# Patient Record
Sex: Female | Born: 1935 | Race: White | Hispanic: No | Smoking: Former smoker
Health system: Southern US, Community
[De-identification: ages and names within clinical notes are randomized; demographics above are authoritative.]

## PROBLEM LIST (undated history)

## (undated) DIAGNOSIS — E785 Hyperlipidemia, unspecified: Secondary | ICD-10-CM

## (undated) DIAGNOSIS — S4290XA Fracture of unspecified shoulder girdle, part unspecified, initial encounter for closed fracture: Secondary | ICD-10-CM

## (undated) DIAGNOSIS — Z9289 Personal history of other medical treatment: Secondary | ICD-10-CM

## (undated) DIAGNOSIS — K219 Gastro-esophageal reflux disease without esophagitis: Secondary | ICD-10-CM

## (undated) DIAGNOSIS — Z9981 Dependence on supplemental oxygen: Secondary | ICD-10-CM

## (undated) DIAGNOSIS — K5790 Diverticulosis of intestine, part unspecified, without perforation or abscess without bleeding: Secondary | ICD-10-CM

## (undated) DIAGNOSIS — I2699 Other pulmonary embolism without acute cor pulmonale: Secondary | ICD-10-CM

## (undated) DIAGNOSIS — F419 Anxiety disorder, unspecified: Secondary | ICD-10-CM

## (undated) DIAGNOSIS — I471 Supraventricular tachycardia, unspecified: Secondary | ICD-10-CM

## (undated) DIAGNOSIS — I509 Heart failure, unspecified: Secondary | ICD-10-CM

## (undated) DIAGNOSIS — C210 Malignant neoplasm of anus, unspecified: Secondary | ICD-10-CM

## (undated) DIAGNOSIS — I4891 Unspecified atrial fibrillation: Secondary | ICD-10-CM

## (undated) DIAGNOSIS — Z8719 Personal history of other diseases of the digestive system: Secondary | ICD-10-CM

## (undated) DIAGNOSIS — J189 Pneumonia, unspecified organism: Secondary | ICD-10-CM

## (undated) DIAGNOSIS — K259 Gastric ulcer, unspecified as acute or chronic, without hemorrhage or perforation: Secondary | ICD-10-CM

## (undated) DIAGNOSIS — F329 Major depressive disorder, single episode, unspecified: Secondary | ICD-10-CM

## (undated) DIAGNOSIS — E669 Obesity, unspecified: Secondary | ICD-10-CM

## (undated) DIAGNOSIS — C3490 Malignant neoplasm of unspecified part of unspecified bronchus or lung: Secondary | ICD-10-CM

## (undated) DIAGNOSIS — I10 Essential (primary) hypertension: Secondary | ICD-10-CM

## (undated) DIAGNOSIS — B029 Zoster without complications: Secondary | ICD-10-CM

## (undated) DIAGNOSIS — J302 Other seasonal allergic rhinitis: Secondary | ICD-10-CM

## (undated) DIAGNOSIS — Z950 Presence of cardiac pacemaker: Secondary | ICD-10-CM

## (undated) DIAGNOSIS — E559 Vitamin D deficiency, unspecified: Secondary | ICD-10-CM

## (undated) DIAGNOSIS — H8309 Labyrinthitis, unspecified ear: Secondary | ICD-10-CM

## (undated) DIAGNOSIS — C44311 Basal cell carcinoma of skin of nose: Secondary | ICD-10-CM

## (undated) DIAGNOSIS — J42 Unspecified chronic bronchitis: Secondary | ICD-10-CM

## (undated) DIAGNOSIS — K922 Gastrointestinal hemorrhage, unspecified: Secondary | ICD-10-CM

## (undated) DIAGNOSIS — N6019 Diffuse cystic mastopathy of unspecified breast: Secondary | ICD-10-CM

## (undated) DIAGNOSIS — Z933 Colostomy status: Secondary | ICD-10-CM

## (undated) DIAGNOSIS — D649 Anemia, unspecified: Secondary | ICD-10-CM

## (undated) DIAGNOSIS — F32A Depression, unspecified: Secondary | ICD-10-CM

## (undated) HISTORY — PX: SQUAMOUS CELL CARCINOMA EXCISION: SHX2433

## (undated) HISTORY — DX: Anxiety disorder, unspecified: F41.9

## (undated) HISTORY — DX: Unspecified chronic bronchitis: J42

## (undated) HISTORY — PX: BASAL CELL CARCINOMA EXCISION: SHX1214

## (undated) HISTORY — PX: BREAST CYST ASPIRATION: SHX578

## (undated) HISTORY — DX: Hyperlipidemia, unspecified: E78.5

## (undated) HISTORY — DX: Labyrinthitis, unspecified ear: H83.09

## (undated) HISTORY — DX: Zoster without complications: B02.9

## (undated) HISTORY — DX: Depression, unspecified: F32.A

## (undated) HISTORY — DX: Major depressive disorder, single episode, unspecified: F32.9

## (undated) HISTORY — PX: CATARACT EXTRACTION W/ INTRAOCULAR LENS  IMPLANT, BILATERAL: SHX1307

## (undated) HISTORY — DX: Vitamin D deficiency, unspecified: E55.9

## (undated) HISTORY — DX: Fracture of unspecified shoulder girdle, part unspecified, initial encounter for closed fracture: S42.90XA

## (undated) HISTORY — DX: Malignant neoplasm of unspecified part of unspecified bronchus or lung: C34.90

## (undated) HISTORY — DX: Unspecified atrial fibrillation: I48.91

## (undated) HISTORY — DX: Diffuse cystic mastopathy of unspecified breast: N60.19

## (undated) HISTORY — DX: Essential (primary) hypertension: I10

## (undated) HISTORY — PX: COLON SURGERY: SHX602

## (undated) HISTORY — PX: TONSILLECTOMY AND ADENOIDECTOMY: SUR1326

## (undated) HISTORY — PX: DILATION AND CURETTAGE OF UTERUS: SHX78

## (undated) HISTORY — DX: Obesity, unspecified: E66.9

## (undated) HISTORY — PX: CHOLECYSTECTOMY OPEN: SUR202

## (undated) HISTORY — DX: Diverticulosis of intestine, part unspecified, without perforation or abscess without bleeding: K57.90

## (undated) HISTORY — DX: Supraventricular tachycardia: I47.1

## (undated) HISTORY — DX: Gastro-esophageal reflux disease without esophagitis: K21.9

## (undated) HISTORY — DX: Supraventricular tachycardia, unspecified: I47.10

---

## 1957-08-12 HISTORY — PX: VAGINAL HYSTERECTOMY: SUR661

## 1957-08-12 HISTORY — PX: APPENDECTOMY: SHX54

## 2011-08-13 HISTORY — PX: LUNG REMOVAL, PARTIAL: SHX233

## 2014-05-12 DIAGNOSIS — I2699 Other pulmonary embolism without acute cor pulmonale: Secondary | ICD-10-CM

## 2014-05-12 HISTORY — DX: Other pulmonary embolism without acute cor pulmonale: I26.99

## 2014-08-30 DIAGNOSIS — Z79899 Other long term (current) drug therapy: Secondary | ICD-10-CM | POA: Diagnosis not present

## 2014-08-30 DIAGNOSIS — F419 Anxiety disorder, unspecified: Secondary | ICD-10-CM | POA: Diagnosis not present

## 2014-08-30 DIAGNOSIS — K219 Gastro-esophageal reflux disease without esophagitis: Secondary | ICD-10-CM | POA: Diagnosis not present

## 2014-08-30 DIAGNOSIS — I10 Essential (primary) hypertension: Secondary | ICD-10-CM | POA: Diagnosis not present

## 2014-08-30 DIAGNOSIS — E785 Hyperlipidemia, unspecified: Secondary | ICD-10-CM | POA: Diagnosis not present

## 2014-08-30 DIAGNOSIS — J449 Chronic obstructive pulmonary disease, unspecified: Secondary | ICD-10-CM | POA: Diagnosis not present

## 2014-08-30 DIAGNOSIS — E559 Vitamin D deficiency, unspecified: Secondary | ICD-10-CM | POA: Diagnosis not present

## 2014-09-20 DIAGNOSIS — R0602 Shortness of breath: Secondary | ICD-10-CM | POA: Diagnosis not present

## 2014-09-20 DIAGNOSIS — Z87891 Personal history of nicotine dependence: Secondary | ICD-10-CM | POA: Diagnosis not present

## 2014-09-20 DIAGNOSIS — J9610 Chronic respiratory failure, unspecified whether with hypoxia or hypercapnia: Secondary | ICD-10-CM | POA: Diagnosis not present

## 2014-09-20 DIAGNOSIS — J449 Chronic obstructive pulmonary disease, unspecified: Secondary | ICD-10-CM | POA: Diagnosis not present

## 2014-09-20 DIAGNOSIS — R093 Abnormal sputum: Secondary | ICD-10-CM | POA: Diagnosis not present

## 2014-09-20 DIAGNOSIS — R0902 Hypoxemia: Secondary | ICD-10-CM | POA: Diagnosis not present

## 2014-09-20 DIAGNOSIS — D3A00 Benign carcinoid tumor of unspecified site: Secondary | ICD-10-CM | POA: Diagnosis not present

## 2014-09-21 DIAGNOSIS — E559 Vitamin D deficiency, unspecified: Secondary | ICD-10-CM | POA: Diagnosis not present

## 2014-09-21 DIAGNOSIS — K219 Gastro-esophageal reflux disease without esophagitis: Secondary | ICD-10-CM | POA: Diagnosis not present

## 2014-09-21 DIAGNOSIS — I10 Essential (primary) hypertension: Secondary | ICD-10-CM | POA: Diagnosis not present

## 2014-09-21 DIAGNOSIS — F419 Anxiety disorder, unspecified: Secondary | ICD-10-CM | POA: Diagnosis not present

## 2014-09-23 DIAGNOSIS — I10 Essential (primary) hypertension: Secondary | ICD-10-CM | POA: Diagnosis not present

## 2014-09-23 DIAGNOSIS — E782 Mixed hyperlipidemia: Secondary | ICD-10-CM | POA: Diagnosis not present

## 2014-09-23 DIAGNOSIS — I872 Venous insufficiency (chronic) (peripheral): Secondary | ICD-10-CM | POA: Diagnosis not present

## 2014-09-23 DIAGNOSIS — I482 Chronic atrial fibrillation: Secondary | ICD-10-CM | POA: Diagnosis not present

## 2014-11-23 DIAGNOSIS — D3A00 Benign carcinoid tumor of unspecified site: Secondary | ICD-10-CM | POA: Diagnosis not present

## 2014-11-23 DIAGNOSIS — C7A090 Malignant carcinoid tumor of the bronchus and lung: Secondary | ICD-10-CM | POA: Diagnosis not present

## 2014-11-23 DIAGNOSIS — D649 Anemia, unspecified: Secondary | ICD-10-CM | POA: Diagnosis not present

## 2014-11-30 DIAGNOSIS — I2782 Chronic pulmonary embolism: Secondary | ICD-10-CM | POA: Diagnosis not present

## 2014-11-30 DIAGNOSIS — C7A090 Malignant carcinoid tumor of the bronchus and lung: Secondary | ICD-10-CM | POA: Diagnosis not present

## 2015-01-02 DIAGNOSIS — L918 Other hypertrophic disorders of the skin: Secondary | ICD-10-CM | POA: Diagnosis not present

## 2015-01-02 DIAGNOSIS — D1801 Hemangioma of skin and subcutaneous tissue: Secondary | ICD-10-CM | POA: Diagnosis not present

## 2015-01-02 DIAGNOSIS — L821 Other seborrheic keratosis: Secondary | ICD-10-CM | POA: Diagnosis not present

## 2015-01-02 DIAGNOSIS — C44311 Basal cell carcinoma of skin of nose: Secondary | ICD-10-CM | POA: Diagnosis not present

## 2015-01-02 DIAGNOSIS — D485 Neoplasm of uncertain behavior of skin: Secondary | ICD-10-CM | POA: Diagnosis not present

## 2015-02-01 DIAGNOSIS — B078 Other viral warts: Secondary | ICD-10-CM | POA: Diagnosis not present

## 2015-02-01 DIAGNOSIS — L981 Factitial dermatitis: Secondary | ICD-10-CM | POA: Diagnosis not present

## 2015-02-01 DIAGNOSIS — L821 Other seborrheic keratosis: Secondary | ICD-10-CM | POA: Diagnosis not present

## 2015-02-01 DIAGNOSIS — C44311 Basal cell carcinoma of skin of nose: Secondary | ICD-10-CM | POA: Diagnosis not present

## 2015-02-01 DIAGNOSIS — L820 Inflamed seborrheic keratosis: Secondary | ICD-10-CM | POA: Diagnosis not present

## 2015-02-01 DIAGNOSIS — L720 Epidermal cyst: Secondary | ICD-10-CM | POA: Diagnosis not present

## 2015-02-01 DIAGNOSIS — L918 Other hypertrophic disorders of the skin: Secondary | ICD-10-CM | POA: Diagnosis not present

## 2015-02-06 DIAGNOSIS — L723 Sebaceous cyst: Secondary | ICD-10-CM | POA: Diagnosis not present

## 2015-02-06 DIAGNOSIS — C4491 Basal cell carcinoma of skin, unspecified: Secondary | ICD-10-CM | POA: Diagnosis not present

## 2015-02-24 DIAGNOSIS — N8111 Cystocele, midline: Secondary | ICD-10-CM | POA: Diagnosis not present

## 2015-02-24 DIAGNOSIS — N816 Rectocele: Secondary | ICD-10-CM | POA: Diagnosis not present

## 2015-02-24 DIAGNOSIS — N952 Postmenopausal atrophic vaginitis: Secondary | ICD-10-CM | POA: Diagnosis not present

## 2015-02-24 DIAGNOSIS — Z1231 Encounter for screening mammogram for malignant neoplasm of breast: Secondary | ICD-10-CM | POA: Diagnosis not present

## 2015-02-24 DIAGNOSIS — Z1212 Encounter for screening for malignant neoplasm of rectum: Secondary | ICD-10-CM | POA: Diagnosis not present

## 2015-02-24 DIAGNOSIS — Z01411 Encounter for gynecological examination (general) (routine) with abnormal findings: Secondary | ICD-10-CM | POA: Diagnosis not present

## 2015-02-24 DIAGNOSIS — B379 Candidiasis, unspecified: Secondary | ICD-10-CM | POA: Diagnosis not present

## 2015-03-21 DIAGNOSIS — J9610 Chronic respiratory failure, unspecified whether with hypoxia or hypercapnia: Secondary | ICD-10-CM | POA: Diagnosis not present

## 2015-03-21 DIAGNOSIS — R0902 Hypoxemia: Secondary | ICD-10-CM | POA: Diagnosis not present

## 2015-03-21 DIAGNOSIS — J449 Chronic obstructive pulmonary disease, unspecified: Secondary | ICD-10-CM | POA: Diagnosis not present

## 2015-03-21 DIAGNOSIS — J309 Allergic rhinitis, unspecified: Secondary | ICD-10-CM | POA: Diagnosis not present

## 2015-03-21 DIAGNOSIS — D3A00 Benign carcinoid tumor of unspecified site: Secondary | ICD-10-CM | POA: Diagnosis not present

## 2015-03-21 DIAGNOSIS — R093 Abnormal sputum: Secondary | ICD-10-CM | POA: Diagnosis not present

## 2015-03-21 DIAGNOSIS — Z87891 Personal history of nicotine dependence: Secondary | ICD-10-CM | POA: Diagnosis not present

## 2015-04-10 DIAGNOSIS — C44311 Basal cell carcinoma of skin of nose: Secondary | ICD-10-CM | POA: Diagnosis not present

## 2015-05-01 DIAGNOSIS — L720 Epidermal cyst: Secondary | ICD-10-CM | POA: Diagnosis not present

## 2015-05-01 DIAGNOSIS — L723 Sebaceous cyst: Secondary | ICD-10-CM | POA: Diagnosis not present

## 2015-05-09 DIAGNOSIS — F329 Major depressive disorder, single episode, unspecified: Secondary | ICD-10-CM | POA: Diagnosis not present

## 2015-05-09 DIAGNOSIS — K219 Gastro-esophageal reflux disease without esophagitis: Secondary | ICD-10-CM | POA: Diagnosis not present

## 2015-05-09 DIAGNOSIS — E559 Vitamin D deficiency, unspecified: Secondary | ICD-10-CM | POA: Diagnosis not present

## 2015-05-09 DIAGNOSIS — I4891 Unspecified atrial fibrillation: Secondary | ICD-10-CM | POA: Diagnosis not present

## 2015-05-09 DIAGNOSIS — Z79899 Other long term (current) drug therapy: Secondary | ICD-10-CM | POA: Diagnosis not present

## 2015-05-09 DIAGNOSIS — E785 Hyperlipidemia, unspecified: Secondary | ICD-10-CM | POA: Diagnosis not present

## 2015-05-09 DIAGNOSIS — F419 Anxiety disorder, unspecified: Secondary | ICD-10-CM | POA: Diagnosis not present

## 2015-05-09 DIAGNOSIS — I10 Essential (primary) hypertension: Secondary | ICD-10-CM | POA: Diagnosis not present

## 2015-05-10 DIAGNOSIS — H3531 Nonexudative age-related macular degeneration: Secondary | ICD-10-CM | POA: Diagnosis not present

## 2015-05-31 DIAGNOSIS — D696 Thrombocytopenia, unspecified: Secondary | ICD-10-CM | POA: Diagnosis not present

## 2015-05-31 DIAGNOSIS — C7A090 Malignant carcinoid tumor of the bronchus and lung: Secondary | ICD-10-CM | POA: Diagnosis not present

## 2015-05-31 DIAGNOSIS — Z5111 Encounter for antineoplastic chemotherapy: Secondary | ICD-10-CM | POA: Diagnosis not present

## 2015-05-31 DIAGNOSIS — D701 Agranulocytosis secondary to cancer chemotherapy: Secondary | ICD-10-CM | POA: Diagnosis not present

## 2015-05-31 DIAGNOSIS — D649 Anemia, unspecified: Secondary | ICD-10-CM | POA: Diagnosis not present

## 2015-06-05 DIAGNOSIS — D649 Anemia, unspecified: Secondary | ICD-10-CM | POA: Diagnosis not present

## 2015-06-05 DIAGNOSIS — C7A090 Malignant carcinoid tumor of the bronchus and lung: Secondary | ICD-10-CM | POA: Diagnosis not present

## 2015-06-07 DIAGNOSIS — C7A090 Malignant carcinoid tumor of the bronchus and lung: Secondary | ICD-10-CM | POA: Diagnosis not present

## 2015-06-26 DIAGNOSIS — C7A090 Malignant carcinoid tumor of the bronchus and lung: Secondary | ICD-10-CM | POA: Diagnosis not present

## 2015-08-15 DIAGNOSIS — Z08 Encounter for follow-up examination after completed treatment for malignant neoplasm: Secondary | ICD-10-CM | POA: Diagnosis not present

## 2015-08-15 DIAGNOSIS — L720 Epidermal cyst: Secondary | ICD-10-CM | POA: Diagnosis not present

## 2015-08-15 DIAGNOSIS — D2262 Melanocytic nevi of left upper limb, including shoulder: Secondary | ICD-10-CM | POA: Diagnosis not present

## 2015-08-15 DIAGNOSIS — Z85828 Personal history of other malignant neoplasm of skin: Secondary | ICD-10-CM | POA: Diagnosis not present

## 2015-08-15 DIAGNOSIS — L821 Other seborrheic keratosis: Secondary | ICD-10-CM | POA: Diagnosis not present

## 2015-09-04 DIAGNOSIS — C44311 Basal cell carcinoma of skin of nose: Secondary | ICD-10-CM | POA: Diagnosis not present

## 2015-09-19 DIAGNOSIS — J9610 Chronic respiratory failure, unspecified whether with hypoxia or hypercapnia: Secondary | ICD-10-CM | POA: Diagnosis not present

## 2015-09-19 DIAGNOSIS — R0902 Hypoxemia: Secondary | ICD-10-CM | POA: Diagnosis not present

## 2015-09-19 DIAGNOSIS — C7A090 Malignant carcinoid tumor of the bronchus and lung: Secondary | ICD-10-CM | POA: Diagnosis not present

## 2015-09-19 DIAGNOSIS — D649 Anemia, unspecified: Secondary | ICD-10-CM | POA: Diagnosis not present

## 2015-09-19 DIAGNOSIS — J449 Chronic obstructive pulmonary disease, unspecified: Secondary | ICD-10-CM | POA: Diagnosis not present

## 2015-09-19 DIAGNOSIS — R0602 Shortness of breath: Secondary | ICD-10-CM | POA: Diagnosis not present

## 2015-09-19 DIAGNOSIS — Z23 Encounter for immunization: Secondary | ICD-10-CM | POA: Diagnosis not present

## 2015-09-19 DIAGNOSIS — R093 Abnormal sputum: Secondary | ICD-10-CM | POA: Diagnosis not present

## 2015-09-19 DIAGNOSIS — Z87891 Personal history of nicotine dependence: Secondary | ICD-10-CM | POA: Diagnosis not present

## 2015-11-06 DIAGNOSIS — F419 Anxiety disorder, unspecified: Secondary | ICD-10-CM | POA: Diagnosis not present

## 2015-11-06 DIAGNOSIS — E785 Hyperlipidemia, unspecified: Secondary | ICD-10-CM | POA: Diagnosis not present

## 2015-11-06 DIAGNOSIS — I10 Essential (primary) hypertension: Secondary | ICD-10-CM | POA: Diagnosis not present

## 2015-11-06 DIAGNOSIS — K219 Gastro-esophageal reflux disease without esophagitis: Secondary | ICD-10-CM | POA: Diagnosis not present

## 2015-11-06 DIAGNOSIS — I4891 Unspecified atrial fibrillation: Secondary | ICD-10-CM | POA: Diagnosis not present

## 2015-11-06 DIAGNOSIS — E559 Vitamin D deficiency, unspecified: Secondary | ICD-10-CM | POA: Diagnosis not present

## 2015-11-11 DIAGNOSIS — I509 Heart failure, unspecified: Secondary | ICD-10-CM

## 2015-11-11 HISTORY — DX: Heart failure, unspecified: I50.9

## 2015-11-19 DIAGNOSIS — I480 Paroxysmal atrial fibrillation: Secondary | ICD-10-CM | POA: Diagnosis not present

## 2015-11-19 DIAGNOSIS — Z902 Acquired absence of lung [part of]: Secondary | ICD-10-CM | POA: Diagnosis not present

## 2015-11-19 DIAGNOSIS — Z7901 Long term (current) use of anticoagulants: Secondary | ICD-10-CM | POA: Diagnosis not present

## 2015-11-19 DIAGNOSIS — I509 Heart failure, unspecified: Secondary | ICD-10-CM | POA: Diagnosis not present

## 2015-11-19 DIAGNOSIS — J9691 Respiratory failure, unspecified with hypoxia: Secondary | ICD-10-CM | POA: Diagnosis not present

## 2015-11-19 DIAGNOSIS — F329 Major depressive disorder, single episode, unspecified: Secondary | ICD-10-CM | POA: Diagnosis present

## 2015-11-19 DIAGNOSIS — R0902 Hypoxemia: Secondary | ICD-10-CM | POA: Diagnosis not present

## 2015-11-19 DIAGNOSIS — Z9889 Other specified postprocedural states: Secondary | ICD-10-CM | POA: Diagnosis not present

## 2015-11-19 DIAGNOSIS — I4891 Unspecified atrial fibrillation: Secondary | ICD-10-CM | POA: Diagnosis not present

## 2015-11-19 DIAGNOSIS — I248 Other forms of acute ischemic heart disease: Secondary | ICD-10-CM | POA: Diagnosis not present

## 2015-11-19 DIAGNOSIS — Z85110 Personal history of malignant carcinoid tumor of bronchus and lung: Secondary | ICD-10-CM | POA: Diagnosis not present

## 2015-11-19 DIAGNOSIS — Z881 Allergy status to other antibiotic agents status: Secondary | ICD-10-CM | POA: Diagnosis not present

## 2015-11-19 DIAGNOSIS — J9601 Acute respiratory failure with hypoxia: Secondary | ICD-10-CM | POA: Diagnosis not present

## 2015-11-19 DIAGNOSIS — Z880 Allergy status to penicillin: Secondary | ICD-10-CM | POA: Diagnosis not present

## 2015-11-19 DIAGNOSIS — J449 Chronic obstructive pulmonary disease, unspecified: Secondary | ICD-10-CM | POA: Diagnosis not present

## 2015-11-19 DIAGNOSIS — Z888 Allergy status to other drugs, medicaments and biological substances status: Secondary | ICD-10-CM | POA: Diagnosis not present

## 2015-11-19 DIAGNOSIS — M6281 Muscle weakness (generalized): Secondary | ICD-10-CM | POA: Diagnosis not present

## 2015-11-19 DIAGNOSIS — J309 Allergic rhinitis, unspecified: Secondary | ICD-10-CM | POA: Diagnosis not present

## 2015-11-19 DIAGNOSIS — Z9049 Acquired absence of other specified parts of digestive tract: Secondary | ICD-10-CM | POA: Diagnosis not present

## 2015-11-19 DIAGNOSIS — F29 Unspecified psychosis not due to a substance or known physiological condition: Secondary | ICD-10-CM | POA: Diagnosis present

## 2015-11-19 DIAGNOSIS — E785 Hyperlipidemia, unspecified: Secondary | ICD-10-CM | POA: Diagnosis not present

## 2015-11-19 DIAGNOSIS — Z86711 Personal history of pulmonary embolism: Secondary | ICD-10-CM | POA: Diagnosis not present

## 2015-11-19 DIAGNOSIS — R2689 Other abnormalities of gait and mobility: Secondary | ICD-10-CM | POA: Diagnosis not present

## 2015-11-19 DIAGNOSIS — I110 Hypertensive heart disease with heart failure: Secondary | ICD-10-CM | POA: Diagnosis not present

## 2015-11-19 DIAGNOSIS — K219 Gastro-esophageal reflux disease without esophagitis: Secondary | ICD-10-CM | POA: Diagnosis not present

## 2015-11-19 DIAGNOSIS — Z90711 Acquired absence of uterus with remaining cervical stump: Secondary | ICD-10-CM | POA: Diagnosis not present

## 2015-11-19 DIAGNOSIS — D649 Anemia, unspecified: Secondary | ICD-10-CM | POA: Diagnosis present

## 2015-11-19 DIAGNOSIS — Z885 Allergy status to narcotic agent status: Secondary | ICD-10-CM | POA: Diagnosis not present

## 2015-11-19 DIAGNOSIS — I5020 Unspecified systolic (congestive) heart failure: Secondary | ICD-10-CM | POA: Diagnosis not present

## 2015-11-19 DIAGNOSIS — Z9089 Acquired absence of other organs: Secondary | ICD-10-CM | POA: Diagnosis not present

## 2015-11-19 DIAGNOSIS — Z882 Allergy status to sulfonamides status: Secondary | ICD-10-CM | POA: Diagnosis not present

## 2015-11-19 DIAGNOSIS — I10 Essential (primary) hypertension: Secondary | ICD-10-CM | POA: Diagnosis not present

## 2015-11-19 DIAGNOSIS — J441 Chronic obstructive pulmonary disease with (acute) exacerbation: Secondary | ICD-10-CM | POA: Diagnosis not present

## 2015-11-19 DIAGNOSIS — Z87891 Personal history of nicotine dependence: Secondary | ICD-10-CM | POA: Diagnosis not present

## 2015-11-22 DIAGNOSIS — J9601 Acute respiratory failure with hypoxia: Secondary | ICD-10-CM | POA: Diagnosis not present

## 2015-11-22 DIAGNOSIS — I071 Rheumatic tricuspid insufficiency: Secondary | ICD-10-CM | POA: Diagnosis not present

## 2015-11-22 DIAGNOSIS — I480 Paroxysmal atrial fibrillation: Secondary | ICD-10-CM | POA: Diagnosis not present

## 2015-11-22 DIAGNOSIS — M6281 Muscle weakness (generalized): Secondary | ICD-10-CM | POA: Diagnosis not present

## 2015-11-22 DIAGNOSIS — I4891 Unspecified atrial fibrillation: Secondary | ICD-10-CM | POA: Diagnosis not present

## 2015-11-22 DIAGNOSIS — J309 Allergic rhinitis, unspecified: Secondary | ICD-10-CM | POA: Diagnosis not present

## 2015-11-22 DIAGNOSIS — E785 Hyperlipidemia, unspecified: Secondary | ICD-10-CM | POA: Diagnosis not present

## 2015-11-22 DIAGNOSIS — K219 Gastro-esophageal reflux disease without esophagitis: Secondary | ICD-10-CM | POA: Diagnosis not present

## 2015-11-22 DIAGNOSIS — I5020 Unspecified systolic (congestive) heart failure: Secondary | ICD-10-CM | POA: Diagnosis not present

## 2015-11-22 DIAGNOSIS — J449 Chronic obstructive pulmonary disease, unspecified: Secondary | ICD-10-CM | POA: Diagnosis not present

## 2015-11-22 DIAGNOSIS — F419 Anxiety disorder, unspecified: Secondary | ICD-10-CM | POA: Diagnosis not present

## 2015-11-22 DIAGNOSIS — Z022 Encounter for examination for admission to residential institution: Secondary | ICD-10-CM | POA: Diagnosis not present

## 2015-11-22 DIAGNOSIS — Z86711 Personal history of pulmonary embolism: Secondary | ICD-10-CM | POA: Diagnosis not present

## 2015-11-22 DIAGNOSIS — D649 Anemia, unspecified: Secondary | ICD-10-CM | POA: Diagnosis not present

## 2015-11-22 DIAGNOSIS — J301 Allergic rhinitis due to pollen: Secondary | ICD-10-CM | POA: Diagnosis not present

## 2015-11-22 DIAGNOSIS — R0602 Shortness of breath: Secondary | ICD-10-CM | POA: Diagnosis not present

## 2015-11-22 DIAGNOSIS — M79676 Pain in unspecified toe(s): Secondary | ICD-10-CM | POA: Diagnosis not present

## 2015-11-22 DIAGNOSIS — R2689 Other abnormalities of gait and mobility: Secondary | ICD-10-CM | POA: Diagnosis not present

## 2015-11-22 DIAGNOSIS — F329 Major depressive disorder, single episode, unspecified: Secondary | ICD-10-CM | POA: Diagnosis not present

## 2015-11-22 DIAGNOSIS — I340 Nonrheumatic mitral (valve) insufficiency: Secondary | ICD-10-CM | POA: Diagnosis not present

## 2015-11-22 DIAGNOSIS — I10 Essential (primary) hypertension: Secondary | ICD-10-CM | POA: Diagnosis not present

## 2015-11-22 DIAGNOSIS — I509 Heart failure, unspecified: Secondary | ICD-10-CM | POA: Diagnosis not present

## 2015-11-22 DIAGNOSIS — I272 Other secondary pulmonary hypertension: Secondary | ICD-10-CM | POA: Diagnosis not present

## 2015-11-22 DIAGNOSIS — B351 Tinea unguium: Secondary | ICD-10-CM | POA: Diagnosis not present

## 2015-11-22 DIAGNOSIS — R0902 Hypoxemia: Secondary | ICD-10-CM | POA: Diagnosis not present

## 2015-11-23 DIAGNOSIS — J449 Chronic obstructive pulmonary disease, unspecified: Secondary | ICD-10-CM | POA: Diagnosis not present

## 2015-11-23 DIAGNOSIS — J301 Allergic rhinitis due to pollen: Secondary | ICD-10-CM | POA: Diagnosis not present

## 2015-11-23 DIAGNOSIS — I480 Paroxysmal atrial fibrillation: Secondary | ICD-10-CM | POA: Diagnosis not present

## 2015-11-23 DIAGNOSIS — E785 Hyperlipidemia, unspecified: Secondary | ICD-10-CM | POA: Diagnosis not present

## 2015-11-23 DIAGNOSIS — K219 Gastro-esophageal reflux disease without esophagitis: Secondary | ICD-10-CM | POA: Diagnosis not present

## 2015-11-23 DIAGNOSIS — Z022 Encounter for examination for admission to residential institution: Secondary | ICD-10-CM | POA: Diagnosis not present

## 2015-11-23 DIAGNOSIS — I10 Essential (primary) hypertension: Secondary | ICD-10-CM | POA: Diagnosis not present

## 2015-11-23 DIAGNOSIS — F419 Anxiety disorder, unspecified: Secondary | ICD-10-CM | POA: Diagnosis not present

## 2015-11-23 DIAGNOSIS — F329 Major depressive disorder, single episode, unspecified: Secondary | ICD-10-CM | POA: Diagnosis not present

## 2015-11-23 DIAGNOSIS — D649 Anemia, unspecified: Secondary | ICD-10-CM | POA: Diagnosis not present

## 2015-11-27 DIAGNOSIS — B351 Tinea unguium: Secondary | ICD-10-CM | POA: Diagnosis not present

## 2015-11-27 DIAGNOSIS — I10 Essential (primary) hypertension: Secondary | ICD-10-CM | POA: Diagnosis not present

## 2015-11-27 DIAGNOSIS — M79676 Pain in unspecified toe(s): Secondary | ICD-10-CM | POA: Diagnosis not present

## 2015-12-01 DIAGNOSIS — E785 Hyperlipidemia, unspecified: Secondary | ICD-10-CM | POA: Diagnosis not present

## 2015-12-01 DIAGNOSIS — I10 Essential (primary) hypertension: Secondary | ICD-10-CM | POA: Diagnosis not present

## 2015-12-01 DIAGNOSIS — J449 Chronic obstructive pulmonary disease, unspecified: Secondary | ICD-10-CM | POA: Diagnosis not present

## 2015-12-01 DIAGNOSIS — I480 Paroxysmal atrial fibrillation: Secondary | ICD-10-CM | POA: Diagnosis not present

## 2015-12-01 DIAGNOSIS — F419 Anxiety disorder, unspecified: Secondary | ICD-10-CM | POA: Diagnosis not present

## 2015-12-01 DIAGNOSIS — F329 Major depressive disorder, single episode, unspecified: Secondary | ICD-10-CM | POA: Diagnosis not present

## 2015-12-01 DIAGNOSIS — D649 Anemia, unspecified: Secondary | ICD-10-CM | POA: Diagnosis not present

## 2015-12-01 DIAGNOSIS — K219 Gastro-esophageal reflux disease without esophagitis: Secondary | ICD-10-CM | POA: Diagnosis not present

## 2015-12-01 DIAGNOSIS — J301 Allergic rhinitis due to pollen: Secondary | ICD-10-CM | POA: Diagnosis not present

## 2015-12-06 DIAGNOSIS — I340 Nonrheumatic mitral (valve) insufficiency: Secondary | ICD-10-CM | POA: Diagnosis not present

## 2015-12-06 DIAGNOSIS — I480 Paroxysmal atrial fibrillation: Secondary | ICD-10-CM | POA: Diagnosis not present

## 2015-12-06 DIAGNOSIS — E785 Hyperlipidemia, unspecified: Secondary | ICD-10-CM | POA: Diagnosis not present

## 2015-12-06 DIAGNOSIS — I10 Essential (primary) hypertension: Secondary | ICD-10-CM | POA: Diagnosis not present

## 2015-12-06 DIAGNOSIS — I071 Rheumatic tricuspid insufficiency: Secondary | ICD-10-CM | POA: Diagnosis not present

## 2015-12-06 DIAGNOSIS — R0902 Hypoxemia: Secondary | ICD-10-CM | POA: Diagnosis not present

## 2015-12-06 DIAGNOSIS — I272 Other secondary pulmonary hypertension: Secondary | ICD-10-CM | POA: Diagnosis not present

## 2015-12-13 DIAGNOSIS — R0602 Shortness of breath: Secondary | ICD-10-CM | POA: Diagnosis not present

## 2015-12-13 DIAGNOSIS — I509 Heart failure, unspecified: Secondary | ICD-10-CM | POA: Diagnosis not present

## 2015-12-13 DIAGNOSIS — I4891 Unspecified atrial fibrillation: Secondary | ICD-10-CM | POA: Diagnosis not present

## 2015-12-19 DIAGNOSIS — I10 Essential (primary) hypertension: Secondary | ICD-10-CM | POA: Diagnosis not present

## 2015-12-19 DIAGNOSIS — D649 Anemia, unspecified: Secondary | ICD-10-CM | POA: Diagnosis not present

## 2015-12-19 DIAGNOSIS — E785 Hyperlipidemia, unspecified: Secondary | ICD-10-CM | POA: Diagnosis not present

## 2015-12-19 DIAGNOSIS — I480 Paroxysmal atrial fibrillation: Secondary | ICD-10-CM | POA: Diagnosis not present

## 2015-12-19 DIAGNOSIS — K219 Gastro-esophageal reflux disease without esophagitis: Secondary | ICD-10-CM | POA: Diagnosis not present

## 2015-12-19 DIAGNOSIS — J301 Allergic rhinitis due to pollen: Secondary | ICD-10-CM | POA: Diagnosis not present

## 2015-12-19 DIAGNOSIS — F329 Major depressive disorder, single episode, unspecified: Secondary | ICD-10-CM | POA: Diagnosis not present

## 2015-12-19 DIAGNOSIS — F419 Anxiety disorder, unspecified: Secondary | ICD-10-CM | POA: Diagnosis not present

## 2015-12-19 DIAGNOSIS — J449 Chronic obstructive pulmonary disease, unspecified: Secondary | ICD-10-CM | POA: Diagnosis not present

## 2015-12-27 DIAGNOSIS — Z880 Allergy status to penicillin: Secondary | ICD-10-CM | POA: Diagnosis not present

## 2015-12-27 DIAGNOSIS — Z79899 Other long term (current) drug therapy: Secondary | ICD-10-CM | POA: Diagnosis not present

## 2015-12-27 DIAGNOSIS — A155 Tuberculosis of larynx, trachea and bronchus: Secondary | ICD-10-CM | POA: Diagnosis not present

## 2015-12-27 DIAGNOSIS — I2782 Chronic pulmonary embolism: Secondary | ICD-10-CM | POA: Diagnosis not present

## 2015-12-27 DIAGNOSIS — Z882 Allergy status to sulfonamides status: Secondary | ICD-10-CM | POA: Diagnosis not present

## 2015-12-27 DIAGNOSIS — I4891 Unspecified atrial fibrillation: Secondary | ICD-10-CM | POA: Diagnosis not present

## 2015-12-27 DIAGNOSIS — Z7901 Long term (current) use of anticoagulants: Secondary | ICD-10-CM | POA: Diagnosis not present

## 2015-12-27 DIAGNOSIS — C7A090 Malignant carcinoid tumor of the bronchus and lung: Secondary | ICD-10-CM | POA: Diagnosis not present

## 2015-12-27 DIAGNOSIS — Z803 Family history of malignant neoplasm of breast: Secondary | ICD-10-CM | POA: Diagnosis not present

## 2015-12-27 DIAGNOSIS — D649 Anemia, unspecified: Secondary | ICD-10-CM | POA: Diagnosis not present

## 2015-12-27 DIAGNOSIS — Z881 Allergy status to other antibiotic agents status: Secondary | ICD-10-CM | POA: Diagnosis not present

## 2015-12-28 DIAGNOSIS — I509 Heart failure, unspecified: Secondary | ICD-10-CM | POA: Diagnosis not present

## 2015-12-28 DIAGNOSIS — J449 Chronic obstructive pulmonary disease, unspecified: Secondary | ICD-10-CM | POA: Diagnosis not present

## 2015-12-28 DIAGNOSIS — I480 Paroxysmal atrial fibrillation: Secondary | ICD-10-CM | POA: Diagnosis not present

## 2016-01-04 DIAGNOSIS — I071 Rheumatic tricuspid insufficiency: Secondary | ICD-10-CM | POA: Diagnosis not present

## 2016-01-04 DIAGNOSIS — I480 Paroxysmal atrial fibrillation: Secondary | ICD-10-CM | POA: Diagnosis not present

## 2016-01-04 DIAGNOSIS — I340 Nonrheumatic mitral (valve) insufficiency: Secondary | ICD-10-CM | POA: Diagnosis not present

## 2016-01-04 DIAGNOSIS — Z136 Encounter for screening for cardiovascular disorders: Secondary | ICD-10-CM | POA: Diagnosis not present

## 2016-01-04 DIAGNOSIS — I10 Essential (primary) hypertension: Secondary | ICD-10-CM | POA: Diagnosis not present

## 2016-02-12 DIAGNOSIS — L820 Inflamed seborrheic keratosis: Secondary | ICD-10-CM | POA: Diagnosis not present

## 2016-02-12 DIAGNOSIS — Z85828 Personal history of other malignant neoplasm of skin: Secondary | ICD-10-CM | POA: Diagnosis not present

## 2016-02-12 DIAGNOSIS — L918 Other hypertrophic disorders of the skin: Secondary | ICD-10-CM | POA: Diagnosis not present

## 2016-02-12 DIAGNOSIS — L821 Other seborrheic keratosis: Secondary | ICD-10-CM | POA: Diagnosis not present

## 2016-02-12 DIAGNOSIS — Z789 Other specified health status: Secondary | ICD-10-CM | POA: Diagnosis not present

## 2016-02-12 DIAGNOSIS — L720 Epidermal cyst: Secondary | ICD-10-CM | POA: Diagnosis not present

## 2016-02-12 DIAGNOSIS — Z08 Encounter for follow-up examination after completed treatment for malignant neoplasm: Secondary | ICD-10-CM | POA: Diagnosis not present

## 2016-02-26 DIAGNOSIS — Z7901 Long term (current) use of anticoagulants: Secondary | ICD-10-CM | POA: Diagnosis not present

## 2016-02-26 DIAGNOSIS — Z803 Family history of malignant neoplasm of breast: Secondary | ICD-10-CM | POA: Diagnosis not present

## 2016-02-26 DIAGNOSIS — Z880 Allergy status to penicillin: Secondary | ICD-10-CM | POA: Diagnosis not present

## 2016-02-26 DIAGNOSIS — I2782 Chronic pulmonary embolism: Secondary | ICD-10-CM | POA: Diagnosis not present

## 2016-02-26 DIAGNOSIS — Z79899 Other long term (current) drug therapy: Secondary | ICD-10-CM | POA: Diagnosis not present

## 2016-02-26 DIAGNOSIS — Z806 Family history of leukemia: Secondary | ICD-10-CM | POA: Diagnosis not present

## 2016-02-26 DIAGNOSIS — C7A090 Malignant carcinoid tumor of the bronchus and lung: Secondary | ICD-10-CM | POA: Diagnosis not present

## 2016-02-26 DIAGNOSIS — I4891 Unspecified atrial fibrillation: Secondary | ICD-10-CM | POA: Diagnosis not present

## 2016-02-26 DIAGNOSIS — Z881 Allergy status to other antibiotic agents status: Secondary | ICD-10-CM | POA: Diagnosis not present

## 2016-02-26 DIAGNOSIS — Z808 Family history of malignant neoplasm of other organs or systems: Secondary | ICD-10-CM | POA: Diagnosis not present

## 2016-02-26 DIAGNOSIS — Z882 Allergy status to sulfonamides status: Secondary | ICD-10-CM | POA: Diagnosis not present

## 2016-02-26 DIAGNOSIS — D649 Anemia, unspecified: Secondary | ICD-10-CM | POA: Diagnosis not present

## 2016-03-04 DIAGNOSIS — N952 Postmenopausal atrophic vaginitis: Secondary | ICD-10-CM | POA: Diagnosis not present

## 2016-03-04 DIAGNOSIS — N816 Rectocele: Secondary | ICD-10-CM | POA: Diagnosis not present

## 2016-03-04 DIAGNOSIS — K644 Residual hemorrhoidal skin tags: Secondary | ICD-10-CM | POA: Diagnosis not present

## 2016-03-04 DIAGNOSIS — Z1212 Encounter for screening for malignant neoplasm of rectum: Secondary | ICD-10-CM | POA: Diagnosis not present

## 2016-03-04 DIAGNOSIS — Z1231 Encounter for screening mammogram for malignant neoplasm of breast: Secondary | ICD-10-CM | POA: Diagnosis not present

## 2016-03-04 DIAGNOSIS — N8111 Cystocele, midline: Secondary | ICD-10-CM | POA: Diagnosis not present

## 2016-03-04 DIAGNOSIS — R195 Other fecal abnormalities: Secondary | ICD-10-CM | POA: Diagnosis not present

## 2016-03-18 DIAGNOSIS — R093 Abnormal sputum: Secondary | ICD-10-CM | POA: Diagnosis not present

## 2016-03-18 DIAGNOSIS — D3A00 Benign carcinoid tumor of unspecified site: Secondary | ICD-10-CM | POA: Diagnosis not present

## 2016-03-18 DIAGNOSIS — Z87891 Personal history of nicotine dependence: Secondary | ICD-10-CM | POA: Diagnosis not present

## 2016-03-18 DIAGNOSIS — J449 Chronic obstructive pulmonary disease, unspecified: Secondary | ICD-10-CM | POA: Diagnosis not present

## 2016-03-18 DIAGNOSIS — J9610 Chronic respiratory failure, unspecified whether with hypoxia or hypercapnia: Secondary | ICD-10-CM | POA: Diagnosis not present

## 2016-03-18 DIAGNOSIS — R0902 Hypoxemia: Secondary | ICD-10-CM | POA: Diagnosis not present

## 2016-03-18 DIAGNOSIS — R0602 Shortness of breath: Secondary | ICD-10-CM | POA: Diagnosis not present

## 2016-03-18 DIAGNOSIS — J301 Allergic rhinitis due to pollen: Secondary | ICD-10-CM | POA: Diagnosis not present

## 2016-04-03 DIAGNOSIS — K625 Hemorrhage of anus and rectum: Secondary | ICD-10-CM | POA: Diagnosis not present

## 2016-04-08 ENCOUNTER — Encounter: Payer: Self-pay | Admitting: Internal Medicine

## 2016-05-08 DIAGNOSIS — I10 Essential (primary) hypertension: Secondary | ICD-10-CM | POA: Diagnosis not present

## 2016-05-08 DIAGNOSIS — F341 Dysthymic disorder: Secondary | ICD-10-CM | POA: Diagnosis not present

## 2016-05-08 DIAGNOSIS — K219 Gastro-esophageal reflux disease without esophagitis: Secondary | ICD-10-CM | POA: Diagnosis not present

## 2016-05-08 DIAGNOSIS — I4891 Unspecified atrial fibrillation: Secondary | ICD-10-CM | POA: Diagnosis not present

## 2016-05-08 DIAGNOSIS — E785 Hyperlipidemia, unspecified: Secondary | ICD-10-CM | POA: Diagnosis not present

## 2016-05-08 DIAGNOSIS — F419 Anxiety disorder, unspecified: Secondary | ICD-10-CM | POA: Diagnosis not present

## 2016-05-08 DIAGNOSIS — Z6841 Body Mass Index (BMI) 40.0 and over, adult: Secondary | ICD-10-CM | POA: Diagnosis not present

## 2016-05-08 DIAGNOSIS — K625 Hemorrhage of anus and rectum: Secondary | ICD-10-CM | POA: Diagnosis not present

## 2016-05-08 DIAGNOSIS — E559 Vitamin D deficiency, unspecified: Secondary | ICD-10-CM | POA: Diagnosis not present

## 2016-05-13 DIAGNOSIS — K591 Functional diarrhea: Secondary | ICD-10-CM | POA: Diagnosis not present

## 2016-05-13 DIAGNOSIS — D509 Iron deficiency anemia, unspecified: Secondary | ICD-10-CM | POA: Diagnosis not present

## 2016-05-13 DIAGNOSIS — R195 Other fecal abnormalities: Secondary | ICD-10-CM | POA: Diagnosis not present

## 2016-05-13 DIAGNOSIS — K644 Residual hemorrhoidal skin tags: Secondary | ICD-10-CM | POA: Diagnosis not present

## 2016-05-15 DIAGNOSIS — K644 Residual hemorrhoidal skin tags: Secondary | ICD-10-CM | POA: Diagnosis not present

## 2016-05-21 DIAGNOSIS — H353111 Nonexudative age-related macular degeneration, right eye, early dry stage: Secondary | ICD-10-CM | POA: Diagnosis not present

## 2016-05-31 ENCOUNTER — Ambulatory Visit: Payer: Self-pay | Admitting: Internal Medicine

## 2016-06-17 DIAGNOSIS — A155 Tuberculosis of larynx, trachea and bronchus: Secondary | ICD-10-CM | POA: Diagnosis not present

## 2016-06-17 DIAGNOSIS — Z803 Family history of malignant neoplasm of breast: Secondary | ICD-10-CM | POA: Diagnosis not present

## 2016-06-17 DIAGNOSIS — C7A090 Malignant carcinoid tumor of the bronchus and lung: Secondary | ICD-10-CM | POA: Diagnosis not present

## 2016-06-17 DIAGNOSIS — Z880 Allergy status to penicillin: Secondary | ICD-10-CM | POA: Diagnosis not present

## 2016-06-17 DIAGNOSIS — Z882 Allergy status to sulfonamides status: Secondary | ICD-10-CM | POA: Diagnosis not present

## 2016-06-17 DIAGNOSIS — Z7901 Long term (current) use of anticoagulants: Secondary | ICD-10-CM | POA: Diagnosis not present

## 2016-06-17 DIAGNOSIS — Z800 Family history of malignant neoplasm of digestive organs: Secondary | ICD-10-CM | POA: Diagnosis not present

## 2016-06-17 DIAGNOSIS — Z885 Allergy status to narcotic agent status: Secondary | ICD-10-CM | POA: Diagnosis not present

## 2016-06-17 DIAGNOSIS — Z79899 Other long term (current) drug therapy: Secondary | ICD-10-CM | POA: Diagnosis not present

## 2016-06-17 DIAGNOSIS — Z8611 Personal history of tuberculosis: Secondary | ICD-10-CM | POA: Diagnosis not present

## 2016-06-17 DIAGNOSIS — Z807 Family history of other malignant neoplasms of lymphoid, hematopoietic and related tissues: Secondary | ICD-10-CM | POA: Diagnosis not present

## 2016-06-17 DIAGNOSIS — Z881 Allergy status to other antibiotic agents status: Secondary | ICD-10-CM | POA: Diagnosis not present

## 2016-06-17 DIAGNOSIS — Z86711 Personal history of pulmonary embolism: Secondary | ICD-10-CM | POA: Diagnosis not present

## 2016-06-17 DIAGNOSIS — D649 Anemia, unspecified: Secondary | ICD-10-CM | POA: Diagnosis not present

## 2016-06-17 DIAGNOSIS — I4891 Unspecified atrial fibrillation: Secondary | ICD-10-CM | POA: Diagnosis not present

## 2016-06-21 ENCOUNTER — Ambulatory Visit: Payer: Self-pay | Admitting: Internal Medicine

## 2016-06-27 DIAGNOSIS — D509 Iron deficiency anemia, unspecified: Secondary | ICD-10-CM | POA: Diagnosis not present

## 2016-06-27 DIAGNOSIS — K625 Hemorrhage of anus and rectum: Secondary | ICD-10-CM | POA: Diagnosis not present

## 2016-07-02 DIAGNOSIS — Z882 Allergy status to sulfonamides status: Secondary | ICD-10-CM | POA: Diagnosis not present

## 2016-07-02 DIAGNOSIS — I10 Essential (primary) hypertension: Secondary | ICD-10-CM | POA: Diagnosis not present

## 2016-07-02 DIAGNOSIS — I4891 Unspecified atrial fibrillation: Secondary | ICD-10-CM | POA: Diagnosis not present

## 2016-07-02 DIAGNOSIS — Z886 Allergy status to analgesic agent status: Secondary | ICD-10-CM | POA: Diagnosis not present

## 2016-07-02 DIAGNOSIS — Z881 Allergy status to other antibiotic agents status: Secondary | ICD-10-CM | POA: Diagnosis not present

## 2016-07-02 DIAGNOSIS — Z87891 Personal history of nicotine dependence: Secondary | ICD-10-CM | POA: Diagnosis not present

## 2016-07-02 DIAGNOSIS — J449 Chronic obstructive pulmonary disease, unspecified: Secondary | ICD-10-CM | POA: Diagnosis not present

## 2016-07-02 DIAGNOSIS — Z7901 Long term (current) use of anticoagulants: Secondary | ICD-10-CM | POA: Diagnosis not present

## 2016-07-02 DIAGNOSIS — Z86711 Personal history of pulmonary embolism: Secondary | ICD-10-CM | POA: Diagnosis not present

## 2016-07-02 DIAGNOSIS — Z880 Allergy status to penicillin: Secondary | ICD-10-CM | POA: Diagnosis not present

## 2016-07-02 DIAGNOSIS — D649 Anemia, unspecified: Secondary | ICD-10-CM | POA: Diagnosis not present

## 2016-07-11 ENCOUNTER — Encounter: Payer: Self-pay | Admitting: Physician Assistant

## 2016-07-12 DIAGNOSIS — C210 Malignant neoplasm of anus, unspecified: Secondary | ICD-10-CM

## 2016-07-12 DIAGNOSIS — Z933 Colostomy status: Secondary | ICD-10-CM

## 2016-07-12 HISTORY — DX: Malignant neoplasm of anus, unspecified: C21.0

## 2016-07-12 HISTORY — DX: Colostomy status: Z93.3

## 2016-07-22 ENCOUNTER — Emergency Department (HOSPITAL_COMMUNITY): Payer: Medicare Other

## 2016-07-22 ENCOUNTER — Ambulatory Visit (INDEPENDENT_AMBULATORY_CARE_PROVIDER_SITE_OTHER): Payer: Medicare Other | Admitting: Physician Assistant

## 2016-07-22 ENCOUNTER — Other Ambulatory Visit (INDEPENDENT_AMBULATORY_CARE_PROVIDER_SITE_OTHER): Payer: Medicare Other

## 2016-07-22 ENCOUNTER — Encounter: Payer: Self-pay | Admitting: Physician Assistant

## 2016-07-22 ENCOUNTER — Inpatient Hospital Stay (HOSPITAL_COMMUNITY)
Admission: EM | Admit: 2016-07-22 | Discharge: 2016-08-02 | DRG: 330 | Disposition: A | Discharge status: Skilled Nursing Facility | Payer: Medicare Other | Attending: Internal Medicine | Admitting: Internal Medicine

## 2016-07-22 ENCOUNTER — Encounter (HOSPITAL_COMMUNITY): Payer: Self-pay | Admitting: *Deleted

## 2016-07-22 VITALS — BP 104/76 | HR 84 | Ht 64.0 in | Wt 245.0 lb

## 2016-07-22 DIAGNOSIS — Z6841 Body Mass Index (BMI) 40.0 and over, adult: Secondary | ICD-10-CM

## 2016-07-22 DIAGNOSIS — R634 Abnormal weight loss: Secondary | ICD-10-CM

## 2016-07-22 DIAGNOSIS — K6289 Other specified diseases of anus and rectum: Secondary | ICD-10-CM

## 2016-07-22 DIAGNOSIS — Z87891 Personal history of nicotine dependence: Secondary | ICD-10-CM | POA: Diagnosis not present

## 2016-07-22 DIAGNOSIS — C210 Malignant neoplasm of anus, unspecified: Secondary | ICD-10-CM | POA: Diagnosis present

## 2016-07-22 DIAGNOSIS — I2699 Other pulmonary embolism without acute cor pulmonale: Secondary | ICD-10-CM

## 2016-07-22 DIAGNOSIS — I5032 Chronic diastolic (congestive) heart failure: Secondary | ICD-10-CM | POA: Diagnosis present

## 2016-07-22 DIAGNOSIS — Z86711 Personal history of pulmonary embolism: Secondary | ICD-10-CM

## 2016-07-22 DIAGNOSIS — Z515 Encounter for palliative care: Secondary | ICD-10-CM | POA: Diagnosis not present

## 2016-07-22 DIAGNOSIS — I482 Chronic atrial fibrillation: Secondary | ICD-10-CM | POA: Diagnosis present

## 2016-07-22 DIAGNOSIS — R748 Abnormal levels of other serum enzymes: Secondary | ICD-10-CM | POA: Diagnosis present

## 2016-07-22 DIAGNOSIS — R0600 Dyspnea, unspecified: Secondary | ICD-10-CM | POA: Diagnosis present

## 2016-07-22 DIAGNOSIS — D649 Anemia, unspecified: Secondary | ICD-10-CM | POA: Diagnosis not present

## 2016-07-22 DIAGNOSIS — R918 Other nonspecific abnormal finding of lung field: Secondary | ICD-10-CM | POA: Diagnosis not present

## 2016-07-22 DIAGNOSIS — K648 Other hemorrhoids: Secondary | ICD-10-CM | POA: Diagnosis not present

## 2016-07-22 DIAGNOSIS — I248 Other forms of acute ischemic heart disease: Secondary | ICD-10-CM | POA: Diagnosis present

## 2016-07-22 DIAGNOSIS — E6609 Other obesity due to excess calories: Secondary | ICD-10-CM

## 2016-07-22 DIAGNOSIS — Z5331 Laparoscopic surgical procedure converted to open procedure: Secondary | ICD-10-CM

## 2016-07-22 DIAGNOSIS — I480 Paroxysmal atrial fibrillation: Secondary | ICD-10-CM | POA: Diagnosis not present

## 2016-07-22 DIAGNOSIS — I4891 Unspecified atrial fibrillation: Secondary | ICD-10-CM | POA: Diagnosis present

## 2016-07-22 DIAGNOSIS — E669 Obesity, unspecified: Secondary | ICD-10-CM | POA: Diagnosis present

## 2016-07-22 DIAGNOSIS — Z888 Allergy status to other drugs, medicaments and biological substances status: Secondary | ICD-10-CM

## 2016-07-22 DIAGNOSIS — J449 Chronic obstructive pulmonary disease, unspecified: Secondary | ICD-10-CM | POA: Diagnosis present

## 2016-07-22 DIAGNOSIS — I110 Hypertensive heart disease with heart failure: Secondary | ICD-10-CM | POA: Diagnosis present

## 2016-07-22 DIAGNOSIS — Z882 Allergy status to sulfonamides status: Secondary | ICD-10-CM

## 2016-07-22 DIAGNOSIS — Z85118 Personal history of other malignant neoplasm of bronchus and lung: Secondary | ICD-10-CM

## 2016-07-22 DIAGNOSIS — R7989 Other specified abnormal findings of blood chemistry: Secondary | ICD-10-CM

## 2016-07-22 DIAGNOSIS — R0602 Shortness of breath: Secondary | ICD-10-CM | POA: Diagnosis present

## 2016-07-22 DIAGNOSIS — R778 Other specified abnormalities of plasma proteins: Secondary | ICD-10-CM | POA: Diagnosis present

## 2016-07-22 DIAGNOSIS — H9190 Unspecified hearing loss, unspecified ear: Secondary | ICD-10-CM | POA: Diagnosis present

## 2016-07-22 DIAGNOSIS — K625 Hemorrhage of anus and rectum: Secondary | ICD-10-CM | POA: Diagnosis present

## 2016-07-22 DIAGNOSIS — E559 Vitamin D deficiency, unspecified: Secondary | ICD-10-CM | POA: Diagnosis present

## 2016-07-22 DIAGNOSIS — K629 Disease of anus and rectum, unspecified: Secondary | ICD-10-CM

## 2016-07-22 DIAGNOSIS — I10 Essential (primary) hypertension: Secondary | ICD-10-CM | POA: Diagnosis not present

## 2016-07-22 DIAGNOSIS — F39 Unspecified mood [affective] disorder: Secondary | ICD-10-CM | POA: Diagnosis present

## 2016-07-22 DIAGNOSIS — I2510 Atherosclerotic heart disease of native coronary artery without angina pectoris: Secondary | ICD-10-CM | POA: Diagnosis present

## 2016-07-22 DIAGNOSIS — C3491 Malignant neoplasm of unspecified part of right bronchus or lung: Secondary | ICD-10-CM | POA: Diagnosis not present

## 2016-07-22 DIAGNOSIS — Z933 Colostomy status: Secondary | ICD-10-CM | POA: Diagnosis not present

## 2016-07-22 DIAGNOSIS — C3490 Malignant neoplasm of unspecified part of unspecified bronchus or lung: Secondary | ICD-10-CM | POA: Diagnosis present

## 2016-07-22 DIAGNOSIS — Z881 Allergy status to other antibiotic agents status: Secondary | ICD-10-CM

## 2016-07-22 DIAGNOSIS — Z880 Allergy status to penicillin: Secondary | ICD-10-CM

## 2016-07-22 DIAGNOSIS — R229 Localized swelling, mass and lump, unspecified: Secondary | ICD-10-CM | POA: Diagnosis present

## 2016-07-22 DIAGNOSIS — C218 Malignant neoplasm of overlapping sites of rectum, anus and anal canal: Principal | ICD-10-CM | POA: Diagnosis present

## 2016-07-22 DIAGNOSIS — R911 Solitary pulmonary nodule: Secondary | ICD-10-CM | POA: Diagnosis present

## 2016-07-22 DIAGNOSIS — Z7901 Long term (current) use of anticoagulants: Secondary | ICD-10-CM

## 2016-07-22 DIAGNOSIS — C211 Malignant neoplasm of anal canal: Secondary | ICD-10-CM | POA: Diagnosis not present

## 2016-07-22 DIAGNOSIS — K660 Peritoneal adhesions (postprocedural) (postinfection): Secondary | ICD-10-CM | POA: Diagnosis not present

## 2016-07-22 DIAGNOSIS — Z886 Allergy status to analgesic agent status: Secondary | ICD-10-CM

## 2016-07-22 DIAGNOSIS — E785 Hyperlipidemia, unspecified: Secondary | ICD-10-CM | POA: Diagnosis present

## 2016-07-22 DIAGNOSIS — F419 Anxiety disorder, unspecified: Secondary | ICD-10-CM | POA: Diagnosis present

## 2016-07-22 LAB — CBC WITH DIFFERENTIAL/PLATELET
BASOS ABS: 0 10*3/uL (ref 0.0–0.1)
BASOS PCT: 0.2 % (ref 0.0–3.0)
Basophils Absolute: 0 10*3/uL (ref 0.0–0.1)
Basophils Relative: 0 %
EOS ABS: 0.1 10*3/uL (ref 0.0–0.7)
EOS PCT: 1.2 % (ref 0.0–5.0)
EOS PCT: 2 %
Eosinophils Absolute: 0.1 10*3/uL (ref 0.0–0.7)
HCT: 29.9 % — ABNORMAL LOW (ref 36.0–46.0)
HEMATOCRIT: 30.8 % — AB (ref 36.0–46.0)
HEMOGLOBIN: 9.9 g/dL — AB (ref 12.0–15.0)
HEMOGLOBIN: 8.8 g/dL — AB (ref 12.0–15.0)
LYMPHS ABS: 1.6 10*3/uL (ref 0.7–4.0)
LYMPHS PCT: 15.5 % (ref 12.0–46.0)
LYMPHS PCT: 23 %
Lymphs Abs: 1.4 10*3/uL (ref 0.7–4.0)
MCH: 25.7 pg — AB (ref 26.0–34.0)
MCHC: 32 g/dL (ref 30.0–36.0)
MCHC: 29.4 g/dL — ABNORMAL LOW (ref 30.0–36.0)
MCV: 82.9 fl (ref 78.0–100.0)
MCV: 87.2 fL (ref 78.0–100.0)
Monocytes Absolute: 0.6 10*3/uL (ref 0.1–1.0)
Monocytes Absolute: 0.7 10*3/uL (ref 0.1–1.0)
Monocytes Relative: 6.6 % (ref 3.0–12.0)
Monocytes Relative: 10 %
NEUTROS PCT: 65 %
Neutro Abs: 6.8 10*3/uL (ref 1.4–7.7)
Neutro Abs: 4.8 10*3/uL (ref 1.7–7.7)
Neutrophils Relative %: 76.5 % (ref 43.0–77.0)
Platelets: 257 10*3/uL (ref 150.0–400.0)
Platelets: 217 10*3/uL (ref 150–400)
RBC: 3.72 Mil/uL — ABNORMAL LOW (ref 3.87–5.11)
RBC: 3.43 MIL/uL — AB (ref 3.87–5.11)
RDW: 15.8 % — AB (ref 11.5–15.5)
RDW: 15.1 % (ref 11.5–15.5)
WBC: 8.9 10*3/uL (ref 4.0–10.5)
WBC: 7.2 10*3/uL (ref 4.0–10.5)

## 2016-07-22 LAB — CBC
HEMATOCRIT: 31.9 % — AB (ref 36.0–46.0)
HEMOGLOBIN: 9.6 g/dL — AB (ref 12.0–15.0)
MCH: 26.2 pg (ref 26.0–34.0)
MCHC: 30.1 g/dL (ref 30.0–36.0)
MCV: 86.9 fL (ref 78.0–100.0)
Platelets: 254 10*3/uL (ref 150–400)
RBC: 3.67 MIL/uL — ABNORMAL LOW (ref 3.87–5.11)
RDW: 15.1 % (ref 11.5–15.5)
WBC: 7.9 10*3/uL (ref 4.0–10.5)

## 2016-07-22 LAB — BASIC METABOLIC PANEL
Anion gap: 6 (ref 5–15)
BUN: 11 mg/dL (ref 6–20)
CALCIUM: 9.4 mg/dL (ref 8.9–10.3)
CO2: 33 mmol/L — AB (ref 22–32)
Chloride: 103 mmol/L (ref 101–111)
Creatinine, Ser: 1.05 mg/dL — ABNORMAL HIGH (ref 0.44–1.00)
GFR calc Af Amer: 55 mL/min — ABNORMAL LOW (ref >60)
GFR, EST NON AFRICAN AMERICAN: 48 mL/min — AB (ref >60)
GLUCOSE: 124 mg/dL — AB (ref 65–99)
POTASSIUM: 4 mmol/L (ref 3.5–5.1)
Sodium: 142 mmol/L (ref 135–145)

## 2016-07-22 LAB — HEPARIN LEVEL (UNFRACTIONATED)

## 2016-07-22 LAB — PROTIME-INR
INR: 1.3
Prothrombin Time: 16.2 seconds — ABNORMAL HIGH (ref 11.4–15.2)

## 2016-07-22 LAB — COMPREHENSIVE METABOLIC PANEL
ALBUMIN: 3.5 g/dL (ref 3.5–5.2)
ALK PHOS: 41 U/L (ref 39–117)
ALT: 10 U/L (ref 0–35)
AST: 18 U/L (ref 0–37)
BUN: 12 mg/dL (ref 6–23)
CALCIUM: 9.5 mg/dL (ref 8.4–10.5)
CO2: 32 mEq/L (ref 19–32)
CREATININE: 1.07 mg/dL (ref 0.40–1.20)
Chloride: 102 mEq/L (ref 96–112)
GFR: 52.05 mL/min — ABNORMAL LOW (ref >60.00)
Glucose, Bld: 139 mg/dL — ABNORMAL HIGH (ref 70–99)
POTASSIUM: 4.3 meq/L (ref 3.5–5.1)
Sodium: 140 mEq/L (ref 135–145)
Total Bilirubin: 0.3 mg/dL (ref 0.2–1.2)
Total Protein: 6.2 g/dL (ref 6.0–8.3)

## 2016-07-22 LAB — IBC PANEL
IRON: 26 ug/dL — AB (ref 42–145)
Saturation Ratios: 5 % — ABNORMAL LOW (ref 20.0–50.0)
TRANSFERRIN: 368 mg/dL — AB (ref 212.0–360.0)

## 2016-07-22 LAB — BRAIN NATRIURETIC PEPTIDE: B Natriuretic Peptide: 305.6 pg/mL — ABNORMAL HIGH (ref 0.0–100.0)

## 2016-07-22 LAB — TYPE AND SCREEN
ABO/RH(D): O POS
Antibody Screen: NEGATIVE

## 2016-07-22 LAB — ABO/RH: ABO/RH(D): O POS

## 2016-07-22 LAB — I-STAT TROPONIN, ED: Troponin i, poc: 0.09 ng/mL (ref 0.00–0.08)

## 2016-07-22 LAB — TROPONIN I: TROPONIN I: 0.08 ng/mL — AB (ref <0.03)

## 2016-07-22 LAB — APTT: APTT: 29 s (ref 24–36)

## 2016-07-22 MED ORDER — POLYETHYLENE GLYCOL 3350 17 G PO PACK
17.0000 g | PACK | Freq: Every day | ORAL | Status: DC
  Administered 2016-07-31 – 2016-08-01 (×2): 17 g via ORAL
  Filled 2016-07-22 (×6): qty 1

## 2016-07-22 MED ORDER — ALBUTEROL SULFATE (2.5 MG/3ML) 0.083% IN NEBU: 2.5000 mg | INHALATION_SOLUTION | Freq: Four times a day (QID) | RESPIRATORY_TRACT | Status: DC | PRN

## 2016-07-22 MED ORDER — ACETAMINOPHEN 650 MG RE SUPP: 650.0000 mg | Freq: Four times a day (QID) | RECTAL | Status: DC | PRN

## 2016-07-22 MED ORDER — SENNOSIDES-DOCUSATE SODIUM 8.6-50 MG PO TABS
1.0000 | ORAL_TABLET | Freq: Two times a day (BID) | ORAL | Status: DC
  Administered 2016-07-22 – 2016-08-01 (×10): 1 via ORAL
  Filled 2016-07-22 (×15): qty 1

## 2016-07-22 MED ORDER — PANTOPRAZOLE SODIUM 40 MG PO TBEC
40.0000 mg | DELAYED_RELEASE_TABLET | Freq: Every day | ORAL | Status: DC
  Administered 2016-07-23 – 2016-08-02 (×10): 40 mg via ORAL
  Filled 2016-07-22 (×11): qty 1

## 2016-07-22 MED ORDER — BISACODYL 10 MG RE SUPP: 10.0000 mg | Freq: Every day | RECTAL | Status: DC | PRN

## 2016-07-22 MED ORDER — FLUTICASONE PROPIONATE 50 MCG/ACT NA SUSP
1.0000 | Freq: Every day | NASAL | Status: DC
  Administered 2016-07-23 – 2016-08-01 (×9): 1 via NASAL
  Filled 2016-07-22 (×2): qty 16

## 2016-07-22 MED ORDER — HYDROCORTISONE ACETATE 25 MG RE SUPP: 25.0000 mg | Freq: Two times a day (BID) | RECTAL | Status: DC

## 2016-07-22 MED ORDER — FERROUS SULFATE 325 (65 FE) MG PO TABS
325.0000 mg | ORAL_TABLET | Freq: Three times a day (TID) | ORAL | Status: DC
  Administered 2016-07-23 – 2016-08-02 (×25): 325 mg via ORAL
  Filled 2016-07-22 (×23): qty 1

## 2016-07-22 MED ORDER — ONDANSETRON HCL 4 MG/2ML IJ SOLN
4.0000 mg | Freq: Four times a day (QID) | INTRAMUSCULAR | Status: DC | PRN
  Administered 2016-07-25 (×2): 4 mg via INTRAVENOUS
  Filled 2016-07-22 (×4): qty 2

## 2016-07-22 MED ORDER — HEPARIN (PORCINE) IN NACL 100-0.45 UNIT/ML-% IJ SOLN
1450.0000 [IU]/h | INTRAMUSCULAR | Status: DC
  Administered 2016-07-22: 1150 [IU]/h via INTRAVENOUS
  Administered 2016-07-23: 1300 [IU]/h via INTRAVENOUS
  Administered 2016-07-24: 1450 [IU]/h via INTRAVENOUS
  Filled 2016-07-22 (×4): qty 250

## 2016-07-22 MED ORDER — PAROXETINE HCL 20 MG PO TABS
20.0000 mg | ORAL_TABLET | Freq: Every day | ORAL | Status: DC
Start: 2016-07-22 — End: 2016-08-02
  Administered 2016-07-22 – 2016-08-01 (×11): 20 mg via ORAL
  Filled 2016-07-22 (×12): qty 1

## 2016-07-22 MED ORDER — FOLIC ACID 0.5 MG HALF TAB
0.5000 mg | ORAL_TABLET | Freq: Every day | ORAL | Status: DC
  Administered 2016-07-23: 0.5 mg via ORAL
  Filled 2016-07-22 (×3): qty 1

## 2016-07-22 MED ORDER — IPRATROPIUM-ALBUTEROL 0.5-2.5 (3) MG/3ML IN SOLN
3.0000 mL | Freq: Two times a day (BID) | RESPIRATORY_TRACT | Status: DC
  Administered 2016-07-22 – 2016-07-24 (×5): 3 mL via RESPIRATORY_TRACT
  Filled 2016-07-22 (×6): qty 3

## 2016-07-22 MED ORDER — LORAZEPAM 0.5 MG PO TABS
0.5000 mg | ORAL_TABLET | Freq: Three times a day (TID) | ORAL | Status: DC | PRN
  Administered 2016-07-22 – 2016-08-02 (×9): 0.5 mg via ORAL
  Filled 2016-07-22 (×9): qty 1

## 2016-07-22 MED ORDER — ACETAMINOPHEN 325 MG PO TABS
650.0000 mg | ORAL_TABLET | Freq: Four times a day (QID) | ORAL | Status: DC | PRN
  Administered 2016-07-24: 650 mg via ORAL
  Filled 2016-07-22: qty 2

## 2016-07-22 MED ORDER — VITAMIN B-12 1000 MCG PO TABS
1000.0000 ug | ORAL_TABLET | Freq: Every day | ORAL | Status: DC
  Administered 2016-07-23 – 2016-08-02 (×9): 1000 ug via ORAL
  Filled 2016-07-22 (×11): qty 1

## 2016-07-22 MED ORDER — ATORVASTATIN CALCIUM 20 MG PO TABS
20.0000 mg | ORAL_TABLET | Freq: Every day | ORAL | Status: DC
  Administered 2016-07-23 – 2016-08-02 (×9): 20 mg via ORAL
  Filled 2016-07-22 (×11): qty 1

## 2016-07-22 MED ORDER — MONTELUKAST SODIUM 10 MG PO TABS
10.0000 mg | ORAL_TABLET | Freq: Every day | ORAL | Status: DC
  Administered 2016-07-23 – 2016-08-02 (×11): 10 mg via ORAL
  Filled 2016-07-22 (×11): qty 1

## 2016-07-22 MED ORDER — ONDANSETRON HCL 4 MG PO TABS: 4.0000 mg | ORAL_TABLET | Freq: Four times a day (QID) | ORAL | Status: DC | PRN

## 2016-07-22 NOTE — Progress Notes (Signed)
ANTICOAGULATION CONSULT NOTE - Initial Consult  Pharmacy Consult for Heparin Indication: PE and afib.  Allergies  Allergen Reactions  . Aleve [Naproxen Sodium] Hives  . Antihistamines, Chlorpheniramine-Type     ANTIHISTAMINES  . Aspirin   . Ciprofloxacin   . Codeine   . Penicillins     Has patient had a PCN reaction causing immediate rash, facial/tongue/throat swelling, SOB or lightheadedness with hypotension: Yes Has patient had a PCN reaction causing severe rash involving mucus membranes or skin necrosis: No Has patient had a PCN reaction that required hospitalization No Has patient had a PCN reaction occurring within the last 10 years: No If all of the above answers are "NO", then may proceed with Cephalosporin use.   . Rocephin [Ceftriaxone Sodium In Dextrose]   . Sulfa Antibiotics   . Hydrochlorothiazide Rash    Patient Measurements: Height: '5\' 4"'$  (162.6 cm) Weight: 255 lb (115.7 kg) IBW/kg (Calculated) : 54.7 Heparin Dosing Weight: 82.5 kg  Vital Signs: Temp: 98.3 F (36.8 C) (12/11 1511) Temp Source: Oral (12/11 1511) BP: 112/40 (12/11 1900) Pulse Rate: 98 (12/11 1900)  Labs:  Recent Labs  07/22/16 1147 07/22/16 1529  HGB 9.9* 9.6*  HCT 30.8* 31.9*  PLT 257.0 254  CREATININE 1.07 1.05*    Estimated Creatinine Clearance: 50.7 mL/min (by C-G formula based on SCr of 1.05 mg/dL (H)).   Medical History: Past Medical History:  Diagnosis Date  . Anxiety   . Atrial fibrillation ([place])   . Chronic bronchitis ([place])   . Chronic depression   . Diverticular disease   . Fibrocystic disease of breast   . Fracture of shoulder   . GERD (gastroesophageal reflux disease)   . Herpes zoster   . Hyperlipidemia   . Hypertension   . Labyrinthitis   . Lung cancer ([place])   . Obesity   . Paroxysmal supraventricular tachycardia ([place])   . Pulmonary embolus ([place])   . Vitamin D deficiency     Medications: r/u med rec  Assessment: 82 y/o F presents with Fatigue,  shortness of breath x 1 month, BRBPR and rectal mass with slow oozing initially noted 6/17 for which she has been receiving outpatient treatment. Patient was referred to GI and followed up with GI today in the clinic and was urgently referred to general surgery. Mild anemia. Elevated troponin 0.09.  PMH: PE 3 years ago, afib, lung cancer S/P resection 2013, HTN, CHF, morbid obesity, dyslipidemia, anxiety, chronic depression, diverticular dz, GERD, herpes zoster, obesity, Vit D def, pSVT, hemorrhoids  Goal of Therapy:  Heparin level 0.3-0.7 units/ml  aptt 66-102 Monitor platelets by anticoagulation protocol: Yes   Plan:  Check baseline coags Hold Eliquis Start IV heparin (no bolus) at 1150 units/hr Check HL and aPTT in 6 hrs  Check heparin level, aPTT, and CBC daily  Golden Emile S. Alford Highland, PharmD, BCPS Clinical Staff Pharmacist Pager [phone]  Eilene Ghazi Stillinger 07/22/2016,7:22 PM

## 2016-07-22 NOTE — H&P (Signed)
[facility] History and Physical   Patient: Tricia Hernandez TJQ:[id number]   PCP: Clinton Quant, MD DOB: [birth year]   DOA: 07/22/2016   DOS: 07/22/2016   DOS: the patient was seen and examined on 07/22/2016  Patient coming from: The patient is coming from home  Chief Complaint: Fatigue, shortness of breath, BRBPR and rectal mass  HPI: Kloi Brodman is a 82 y.o. female with Past medical history of pulmonary embolism 3 years ago, lung cancer S/P resection 2013, HTN, unknown CHF, morbid obesity, dyslipidemia, chronic anticoagulation. The patient presented with complaints of shortness of breath and fatigue. This has been ongoing for last 1 month progressively worsening. Patient also has a rectal mass which she has initially noted in June 2017 and has been receiving outpatient treatment from her PCP as well as outpatient surgery without any benefit. Patient was referred to GI and followed up with GI today in the clinic and was urgently referred to general surgery from where patient was sent to ER for further workup. At the time of my evaluation the patient is denying any complaints of chest pain or chest tightness at rest or on exertion. Patient does have dyspnea on exertion. No nausea no vomiting. No abdominal pain. No fever no chills no cough. No recent change in medications. Patient has a large rectal mass which has been constantly slowly oozing since last few months and causes her to have extreme discomfort while laying flat as well as walking. Denies any diarrhea.  ED Course: Patient was found to be having mild anemia as well as elevated troponin. Hospitalist was consulted for further workup of shortness of breath as well as elevated troponin. Gen. surgery was consulted.  At her baseline ambulates without any walker And is independent for most of her ADL; manages her medication on her own.  Review of Systems: as mentioned in the history of present illness.  All other systems  reviewed and are negative.  Past Medical History:  Diagnosis Date  . Anxiety   . Atrial fibrillation ([place])   . Chronic bronchitis ([place])   . Chronic depression   . Diverticular disease   . Fibrocystic disease of breast   . Fracture of shoulder   . GERD (gastroesophageal reflux disease)   . Herpes zoster   . Hyperlipidemia   . Hypertension   . Labyrinthitis   . Lung cancer ([place])   . Obesity   . Paroxysmal supraventricular tachycardia ([place])   . Pulmonary embolus ([place])   . Vitamin D deficiency    Past Surgical History:  Procedure Laterality Date  . APPENDECTOMY    . BASAL CELL CARCINOMA EXCISION     nose  . breast cyst removal     right  . CATARACT EXTRACTION Bilateral   . CHOLECYSTECTOMY    . LUNG REMOVAL, PARTIAL  2013   RML mass/carcinoid, Dr Lurena Nida  . TONSILLECTOMY AND ADENOIDECTOMY    . TOTAL VAGINAL HYSTERECTOMY     Social History:  reports that she quit smoking about 41 years ago. She has never used smokeless tobacco. She reports that she does not drink alcohol or use drugs.  Allergies  Allergen Reactions  . Aleve [Naproxen Sodium] Hives  . Antihistamines, Chlorpheniramine-Type     ANTIHISTAMINES  . Aspirin   . Ciprofloxacin   . Codeine   . Penicillins     Has patient had a PCN reaction causing immediate rash, facial/tongue/throat swelling, SOB or lightheadedness with hypotension: Yes Has patient had a PCN reaction causing severe rash  involving mucus membranes or skin necrosis: No Has patient had a PCN reaction that required hospitalization No Has patient had a PCN reaction occurring within the last 10 years: No If all of the above answers are "NO", then may proceed with Cephalosporin use.   . Rocephin [Ceftriaxone Sodium In Dextrose]   . Sulfa Antibiotics   . Hydrochlorothiazide Rash     Family History  Problem Relation Age of Onset  . Heart attack Mother     Dec 1987  . CVA Mother   . Hypertension Mother   . Multiple myeloma Mother   . Breast  cancer Sister   . Skin cancer Sister   . Prostate cancer Brother   . Throat cancer Brother   . Cervical cancer Daughter   . Leukemia Daughter   . Leukemia Son   . Throat cancer Brother   . Cancer Brother     soft palette  . Colon cancer Neg Hx   . Pancreatic cancer Neg Hx      Prior to Admission medications   Medication Sig Start Date End Date Taking? Authorizing Provider  albuterol (PROVENTIL HFA;VENTOLIN HFA) 108 (90 Base) MCG/ACT inhaler Inhale 2 puffs into the lungs every 6 (six) hours as needed for wheezing or shortness of breath.   Yes Historical Provider, MD  apixaban (ELIQUIS) 5 MG TABS tablet Take 5 mg by mouth 2 (two) times daily.   Yes Historical Provider, MD  atorvastatin (LIPITOR) 20 MG tablet Take 20 mg by mouth daily.   Yes Historical Provider, MD  Calcium Carbonate-Vitamin D (CALCIUM 600+D) 600-400 MG-UNIT tablet Take 2 tablets by mouth daily.   Yes Historical Provider, MD  Cholecalciferol (VITAMIN D3) 1000 units CAPS Take 1 capsule by mouth daily.   Yes Historical Provider, MD  esomeprazole (NEXIUM) 40 MG capsule Take 40 mg by mouth daily at 12 noon.   Yes Historical Provider, MD  fenofibrate 160 MG tablet Take 160 mg by mouth daily.   Yes Historical Provider, MD  Ferrous Sulfate (IRON) 325 (65 Fe) MG TABS Take 1 tablet by mouth 2 (two) times daily.    Yes Historical Provider, MD  fluticasone (FLONASE) 50 MCG/ACT nasal spray Place 1 spray into both nostrils daily.   Yes Historical Provider, MD  folic acid (FOLVITE) 482 MCG tablet Take 400 mcg by mouth daily.   Yes Historical Provider, MD  ipratropium-albuterol (DUONEB) 0.5-2.5 (3) MG/3ML SOLN Take 3 mLs by nebulization 2 (two) times daily.   Yes Historical Provider, MD  LORazepam (ATIVAN) 0.5 MG tablet Take 0.5 mg by mouth every 8 (eight) hours as needed for anxiety.   Yes Historical Provider, MD  metoprolol (LOPRESSOR) 100 MG tablet Take 100 mg by mouth 2 (two) times daily.   Yes Historical Provider, MD  montelukast  (SINGULAIR) 10 MG tablet Take 10 mg by mouth daily.   Yes Historical Provider, MD  Multiple Vitamin (MULTIVITAMIN WITH MINERALS) TABS tablet Take 1 tablet by mouth daily.   Yes Historical Provider, MD  PARoxetine (PAXIL) 20 MG tablet Take 20 mg by mouth daily.   Yes Historical Provider, MD  pyridOXINE (VITAMIN B-6) 100 MG tablet Take 100 mg by mouth daily.   Yes Historical Provider, MD  vitamin B-12 (CYANOCOBALAMIN) 1000 MCG tablet Take 1,000 mcg by mouth daily.   Yes Historical Provider, MD  docusate sodium (COLACE) 100 MG capsule Take 100 mg by mouth daily as needed for mild constipation.    Historical Provider, MD  hydrocortisone (ANUSOL-HC) 25 MG suppository  Place 25 mg rectally 2 (two) times daily.    Historical Provider, MD  trimethoprim (TRIMPEX) 100 MG tablet Take 100 mg by mouth 2 (two) times daily.    Historical Provider, MD    Physical Exam: Vitals:   07/22/16 1511 07/22/16 1512 07/22/16 1710 07/22/16 1745  BP:  (!) 109/54 103/57 107/61  Pulse: 96  95 100  Resp: _0 Temp: 98.3 F (36.8 C)     TempSrc: Oral     SpO2: 94%  93% 95%  Weight: 115.7 kg (255 lb)     Height: _1  (1.626 m)       General: Alert, Awake and Oriented to Time, Place and Person. Appear in mild distress, affect appropriate Eyes: PERRL, Conjunctiva normal ENT: Oral Mucosa clear moist. Neck: difficult to assess JVD, no Abnormal Mass Or lumps Cardiovascular: S1 and S2 Present, no Murmur, Peripheral Pulses Present Respiratory: Bilateral Air entry equal and Decreased, no use of accessory muscle, Clear to Auscultation, no Crackles, no wheezes Abdomen: Bowel Sound present, Soft and no tenderness Skin: no redness, no Rash, no induration Extremities: trace Pedal edema, no calf tenderness Neurologic: Grossly no focal neuro deficit. Bilaterally Equal motor strength  Labs on Admission:  CBC:  Recent Labs Lab 07/22/16 1147 07/22/16 1529  WBC 8.9 7.9  NEUTROABS 6.8  --   HGB 9.9* 9.6*  HCT 30.8*  31.9*  MCV 82.9 86.9  PLT 257.0 076   Basic Metabolic Panel:  Recent Labs Lab 07/22/16 1147 07/22/16 1529  NA 140 142  K 4.3 4.0  CL 102 103  CO2 32 33*  GLUCOSE 139* 124*  BUN 12 11  CREATININE 1.07 1.05*  CALCIUM 9.5 9.4   GFR: Estimated Creatinine Clearance: 50.7 mL/min (by C-G formula based on SCr of 1.05 mg/dL (H)). Liver Function Tests:  Recent Labs Lab 07/22/16 1147  AST 18  ALT 10  ALKPHOS 41  BILITOT 0.3  PROT 6.2  ALBUMIN 3.5   No results for input(s): LIPASE, AMYLASE in the last 168 hours. No results for input(s): AMMONIA in the last 168 hours. Coagulation Profile: No results for input(s): INR, PROTIME in the last 168 hours. Cardiac Enzymes: No results for input(s): CKTOTAL, CKMB, CKMBINDEX, TROPONINI in the last 168 hours. BNP (last 3 results) No results for input(s): PROBNP in the last 8760 hours. HbA1C: No results for input(s): HGBA1C in the last 72 hours. CBG: No results for input(s): GLUCAP in the last 168 hours. Lipid Profile: No results for input(s): CHOL, HDL, LDLCALC, TRIG, CHOLHDL, LDLDIRECT in the last 72 hours. Thyroid Function Tests: No results for input(s): TSH, T4TOTAL, FREET4, T3FREE, THYROIDAB in the last 72 hours. Anemia Panel:  Recent Labs  07/22/16 1147  IRON 26*   Urine analysis: No results found for: COLORURINE, APPEARANCEUR, LABSPEC, PHURINE, GLUCOSEU, HGBUR, BILIRUBINUR, KETONESUR, PROTEINUR, UROBILINOGEN, NITRITE, LEUKOCYTESUR  Radiological Exams on Admission: Dg Chest 2 View  Result Date: 07/22/2016 CLINICAL DATA:  Shortness of breath and anemia EXAM: CHEST  2 VIEW COMPARISON:  None. FINDINGS: There is mild bilateral interstitial thickening likely chronic. Mild area of linear airspace disease in the right mid lung likely reflecting scarring. 8 mm right lower lobe pulmonary nodule. 7 mm right upper lobe pulmonary nodule. There is no pleural effusion or pneumothorax. The heart and mediastinal contours are unremarkable.  The osseous structures are unremarkable. IMPRESSION: No active cardiopulmonary disease. 8 mm right lower lobe and mm right upper lobe pulmonary nodule. Recommend further evaluation with a nonemergent  CT of the chest. Electronically Signed   By: Kathreen Devoid   On: 07/22/2016 16:56   EKG: Independently reviewed. atrial fibrillation, rate controlled.  Assessment/Plan 1. Bright red blood per rectum. Symptomatic anemia. Rectal mass. Patient presented with complains of fatigue and tiredness. Has a large rectal mass with slow oozing. H&H relatively stable per family. We will obtain type and screen as well as follow serial H&H. General surgery is consulted. We will follow recommendation. Continue Anusol suppository as well as 6 months when necessary. MiraLAX stool softener. She will remain on clear liquid diet at present and nothing by mouth after midnight in case patient requires some procedure in the morning. Transfuse for hemoglobin less than 7. Continue iron supplementation as well. Monitor closely while the patient will remain on therapeutic and physical examination.  2. Elevated troponin. History of CHF. HTN. Soft blood pressure. Dyspnea on exertion Likely patient's dyspnea is associated with anemia. Also elevated troponin is also associated with anemia. We'll monitor serial troponin, check BMP. Check echocardiogram in the morning. Patient may require cardiology consult depending on the further troponin trend. She had a stress test in April 2017. Cardiologist Dr. Carrolyn Leigh. Will need to obtain records from [facility] in the morning. Currently given elevated troponin as well as potential need for procedure for the rectal mass I would transition to patient to therapeutic dose of heparin. Pharmacy to monitor. Holding patient's antihypertensive medication given patient's soft blood pressure and also holding placing the patient on IV fluids given history of CHF and  current euvolemic state.  3. History of PE. A. fib. CHA2DS2-VASc Score 5 Patient has history of PE and A. fib and has been on anticoagulation with Apixaban. Currently we'll hold Apixaban and transitioned to heparin for therapeutic and regulation. A. fib is currently rate controlled. Monitor serial troponin.  4. Pulmonary nodule. History of lung cancer. 15m Return nodule in the right lower lobe and 7 mm right upper lobe pulmonary nodule. Patient does have history of right sided lung cancer S/P surgical resection in the past 2013. Recommend outpatient nonemergent CT of the chest.  5. Possible COPD. It appears that the patient may have possible COPD given her medical regimen. I would continue her home nebulizer as well as inhalers.  6. History of anxiety. Mood disorder. Continuing home regimen.  Nutrition: Clear liquid diet, nothing by mouth after midnight DVT Prophylaxis on therapeutic anticoagulation.  Advance goals of care discussion: Full code, patient's all 3 children will be her power of attorney. Patient would not prefer to be maintained on life support for long period  Consults: Gen. surgery.  Family Communication: family was present at bedside, at the time of interview.  Opportunity was given to ask question and all questions were answered satisfactorily.  Disposition: Admitted as observation, telemetry unit. Likely to be discharged home, in 4 days.  Author: PBerle Mull MD [facility] Pager: [phone]/06/2016  If 7PM-7AM, please contact night-coverage [email or web address] Password [email or web address]

## 2016-07-22 NOTE — Consult Note (Signed)
Reason for Consult:Rectal bleeding Referring Physician: Royal Beirne is an 82 y.o. female.   HPI: Patient is an 82 year old female with a history of hemorrhoids who was referred to our office and seen by Dr. Coralie Keens in our urgent office today. There was concern that she had a thrombosed hemorrhoid on top of a rectal mass. She has previously been treated in [place] she had a colonoscopy in 2015 which was unremarkable. She reports perianal pain for 6 months there has been a mass for several weeks she has been treated for hemorrhoids without improvement and has significant discomfort with moving her bowels daily but no nausea or vomiting. Exam in our office by Dr. Ninfa Linden shows a very enlarged almost external hemorrhoid with some excoriated skin, there were several other small thrombosed hemorrhoids. This area was bleeding and exquisitely tender. He was unable to do a complete exam. At this point she was referred to the emergency department for admission by the hospitalist service because of her multiple comorbidities. It was his opinion she may require transfusion and preoperative cardiac workup. In order to do a proper exam she needs exam under anesthesia to determine if this is hemorrhoidal issue versus a rectal mass.  Past Medical History:  Diagnosis Date  . Anxiety   . Atrial fibrillation ([place])   . Chronic bronchitis ([place])   . Chronic depression   . Diverticular disease   . Fibrocystic disease of breast   . Fracture of shoulder   . GERD (gastroesophageal reflux disease)   . Herpes zoster   . Hyperlipidemia   . Hypertension   . Labyrinthitis   . Lung cancer ([place])   . Obesity   . Paroxysmal supraventricular tachycardia ([place])   . Pulmonary embolus ([place])   . Vitamin D deficiency     Past Surgical History:  Procedure Laterality Date  . APPENDECTOMY    . BASAL CELL CARCINOMA EXCISION     nose  . breast cyst removal     right  . CATARACT EXTRACTION Bilateral    . CHOLECYSTECTOMY    . LUNG REMOVAL, PARTIAL  2013   RML mass/carcinoid, Dr Lurena Nida  . TONSILLECTOMY AND ADENOIDECTOMY    . TOTAL VAGINAL HYSTERECTOMY      Family History  Problem Relation Age of Onset  . Heart attack Mother     Dec 1987  . CVA Mother   . Hypertension Mother   . Multiple myeloma Mother   . Breast cancer Sister   . Skin cancer Sister   . Prostate cancer Brother   . Throat cancer Brother   . Cervical cancer Daughter   . Leukemia Daughter   . Leukemia Son   . Throat cancer Brother   . Cancer Brother     soft palette  . Colon cancer Neg Hx   . Pancreatic cancer Neg Hx     Social History:  reports that she quit smoking about 41 years ago. She has never used smokeless tobacco. She reports that she does not drink alcohol or use drugs.  Allergies:  Allergies  Allergen Reactions  . Aleve [Naproxen Sodium] Hives  . Antihistamines, Chlorpheniramine-Type     ANTIHISTAMINES  . Aspirin   . Ciprofloxacin   . Codeine   . Penicillins     Has patient had a PCN reaction causing immediate rash, facial/tongue/throat swelling, SOB or lightheadedness with hypotension: Yes Has patient had a PCN reaction causing severe rash involving mucus membranes or skin necrosis: No Has patient  had a PCN reaction that required hospitalization No Has patient had a PCN reaction occurring within the last 10 years: No If all of the above answers are "NO", then may proceed with Cephalosporin use.   . Rocephin [Ceftriaxone Sodium In Dextrose]   . Sulfa Antibiotics   . Hydrochlorothiazide Rash    Prior to Admission medications   Medication Sig Start Date End Date Taking? Authorizing Provider  albuterol (PROVENTIL HFA;VENTOLIN HFA) 108 (90 Base) MCG/ACT inhaler Inhale 2 puffs into the lungs every 6 (six) hours as needed for wheezing or shortness of breath.   Yes Historical Provider, MD  apixaban (ELIQUIS) 5 MG TABS tablet Take 5 mg by mouth 2 (two) times daily.   Yes Historical Provider,  MD  atorvastatin (LIPITOR) 20 MG tablet Take 20 mg by mouth daily.   Yes Historical Provider, MD  Calcium Carbonate-Vitamin D (CALCIUM 600+D) 600-400 MG-UNIT tablet Take 2 tablets by mouth daily.   Yes Historical Provider, MD  Cholecalciferol (VITAMIN D3) 1000 units CAPS Take 1 capsule by mouth daily.   Yes Historical Provider, MD  esomeprazole (NEXIUM) 40 MG capsule Take 40 mg by mouth daily at 12 noon.   Yes Historical Provider, MD  fenofibrate 160 MG tablet Take 160 mg by mouth daily.   Yes Historical Provider, MD  Ferrous Sulfate (IRON) 325 (65 Fe) MG TABS Take 1 tablet by mouth 2 (two) times daily.    Yes Historical Provider, MD  fluticasone (FLONASE) 50 MCG/ACT nasal spray Place 1 spray into both nostrils daily.   Yes Historical Provider, MD  folic acid (FOLVITE) 865 MCG tablet Take 400 mcg by mouth daily.   Yes Historical Provider, MD  ipratropium-albuterol (DUONEB) 0.5-2.5 (3) MG/3ML SOLN Take 3 mLs by nebulization 2 (two) times daily.   Yes Historical Provider, MD  LORazepam (ATIVAN) 0.5 MG tablet Take 0.5 mg by mouth every 8 (eight) hours as needed for anxiety.   Yes Historical Provider, MD  metoprolol (LOPRESSOR) 100 MG tablet Take 100 mg by mouth 2 (two) times daily.   Yes Historical Provider, MD  montelukast (SINGULAIR) 10 MG tablet Take 10 mg by mouth daily.   Yes Historical Provider, MD  Multiple Vitamin (MULTIVITAMIN WITH MINERALS) TABS tablet Take 1 tablet by mouth daily.   Yes Historical Provider, MD  PARoxetine (PAXIL) 20 MG tablet Take 20 mg by mouth daily.   Yes Historical Provider, MD  pyridOXINE (VITAMIN B-6) 100 MG tablet Take 100 mg by mouth daily.   Yes Historical Provider, MD  vitamin B-12 (CYANOCOBALAMIN) 1000 MCG tablet Take 1,000 mcg by mouth daily.   Yes Historical Provider, MD  docusate sodium (COLACE) 100 MG capsule Take 100 mg by mouth daily as needed for mild constipation.    Historical Provider, MD  hydrocortisone (ANUSOL-HC) 25 MG suppository Place 25 mg  rectally 2 (two) times daily.    Historical Provider, MD  trimethoprim (TRIMPEX) 100 MG tablet Take 100 mg by mouth 2 (two) times daily.    Historical Provider, MD     Results for orders placed or performed during the hospital encounter of 07/22/16 (from the past 48 hour(s))  Basic metabolic panel     Status: Abnormal   Collection Time: 07/22/16  3:29 PM  Result Value Ref Range   Sodium 142 135 - 145 mmol/L   Potassium 4.0 3.5 - 5.1 mmol/L   Chloride 103 101 - 111 mmol/L   CO2 33 (H) 22 - 32 mmol/L   Glucose, Bld 124 (H) 65 -  99 mg/dL   BUN 11 6 - 20 mg/dL   Creatinine, Ser 1.05 (H) 0.44 - 1.00 mg/dL   Calcium 9.4 8.9 - 10.3 mg/dL   GFR calc non Af Amer 48 (L) >60 mL/min   GFR calc Af Amer 55 (L) >60 mL/min    Comment: (NOTE) The eGFR has been calculated using the CKD EPI equation. This calculation has not been validated in all clinical situations. eGFR's persistently <60 mL/min signify possible Chronic Kidney Disease.    Anion gap 6 5 - 15  CBC     Status: Abnormal   Collection Time: 07/22/16  3:29 PM  Result Value Ref Range   WBC 7.9 4.0 - 10.5 K/uL   RBC 3.67 (L) 3.87 - 5.11 MIL/uL   Hemoglobin 9.6 (L) 12.0 - 15.0 g/dL   HCT 31.9 (L) 36.0 - 46.0 %   MCV 86.9 78.0 - 100.0 fL   MCH 26.2 26.0 - 34.0 pg   MCHC 30.1 30.0 - 36.0 g/dL   RDW 15.1 11.5 - 15.5 %   Platelets 254 150 - 400 K/uL  I-stat troponin, ED     Status: Abnormal   Collection Time: 07/22/16  3:36 PM  Result Value Ref Range   Troponin i, poc 0.09 (HH) 0.00 - 0.08 ng/mL   Comment NOTIFIED PHYSICIAN    Comment 3            Comment: Due to the release kinetics of cTnI, a negative result within the first hours of the onset of symptoms does not rule out myocardial infarction with certainty. If myocardial infarction is still suspected, repeat the test at appropriate intervals.     Dg Chest 2 View  Result Date: 07/22/2016 CLINICAL DATA:  Shortness of breath and anemia EXAM: CHEST  2 VIEW COMPARISON:   None. FINDINGS: There is mild bilateral interstitial thickening likely chronic. Mild area of linear airspace disease in the right mid lung likely reflecting scarring. 8 mm right lower lobe pulmonary nodule. 7 mm right upper lobe pulmonary nodule. There is no pleural effusion or pneumothorax. The heart and mediastinal contours are unremarkable. The osseous structures are unremarkable. IMPRESSION: No active cardiopulmonary disease. 8 mm right lower lobe and mm right upper lobe pulmonary nodule. Recommend further evaluation with a nonemergent CT of the chest. Electronically Signed   By: Kathreen Devoid   On: 07/22/2016 16:56    ROS as per HPI otherwise neg  Blood pressure (!) 112/40, pulse 98, temperature 98.3 F (36.8 C), temperature source Oral, resp. rate (!) 34, height _0  (1.626 m), weight 115.7 kg (255 lb), SpO2 96 %. Physical Exam  Alert and oriented Unlabored respirations Abdomen benign Rectal exam deferred- just examined in office/needs EUA  Assessment/Plan: Rectal pain and bleeding/Possible rectal mass vs hemorrhoids- Exam under anesthesia this admission  Atrial fibrillation/PSVT on Eliquis- transition to hep gtt in anticipation of procedure, cards eval Hx of PE Hypertension Hx of lung cancer/Bronchitits   Mertha Clyatt A Jevon [place] 07/22/2016, 7:19 PM

## 2016-07-22 NOTE — Patient Instructions (Addendum)
You have been scheduled for an appointment with Dr. Rush Farmer at [facility]. Your appointment is on 07/22/16 at 3:30 pm. Please arrive at 3 pm for registration. Make certain to bring a list of current medications, including any over the counter medications or vitamins. Also bring your co-pay if you have one as well as your insurance cards and photo ID. [facility] is located at [address]. Should you need to reschedule your appointment, please contact them at [phone].  Your physician has requested that you go to the basement for lab work before leaving today.

## 2016-07-22 NOTE — ED Triage Notes (Signed)
Pt sent here by [facility] for sob and anemia.  The was sent to [facility] by [facility] for mass/hemorrhoid.  PT pale.  Dyspnea on exertion.

## 2016-07-22 NOTE — ED Notes (Signed)
Report given to Raden, RN on 2W room 19, pt taken care by Jerene Pitch day shift and this RN read from EPI to give report to the unit, pt stable at this time, transported by ED nurse tech.

## 2016-07-22 NOTE — ED Provider Notes (Signed)
[facility] DEPT Provider Note   CSN: [id number] Arrival date & time: 07/22/16  1456     History   Chief Complaint Chief Complaint  Patient presents with  . Shortness of Breath  . Anemia    HPI Tricia Hernandez is a 82 y.o. female. She presents here with her daughter with complaint of weakness, anemia, and rectal mass.  Patient's history of known anemia. Only known recent number was that 3 weeks ago she was 9.5 in [place].  He has had painful rectal mass for at least the last 6 months it has progressed in size. Is now causing leaking incontinence. Occasionally she has blood. Seen a GI today, referred to surgery, then referred here. Her my interpretation of the above notes she will need to undergo exam under anesthesia. She is in A. fib and anticoagulated will need to have medical clearance prior to general anesthesia.  Patient states her shortness of breath is chronic but has been worse over the last 2-3 days. Has a great of pain with her rectum. Said no fevers or chills. No abdominal pain or vomiting. No chest pain.  HPI  Past Medical History:  Diagnosis Date  . Anxiety   . Atrial fibrillation ([place])   . Chronic bronchitis ([place])   . Chronic depression   . Diverticular disease   . Fibrocystic disease of breast   . Fracture of shoulder   . GERD (gastroesophageal reflux disease)   . Herpes zoster   . Hyperlipidemia   . Hypertension   . Labyrinthitis   . Lung cancer ([place])   . Obesity   . Paroxysmal supraventricular tachycardia ([place])   . Pulmonary embolus ([place])   . Vitamin D deficiency     Patient Active Problem List   Diagnosis Date Noted  . SOB (shortness of breath) 07/22/2016  . Symptomatic anemia 07/22/2016  . Rectal mass 07/22/2016  . BRBPR (bright red blood per rectum) 07/22/2016  . Elevated troponin 07/22/2016  . Atrial fibrillation ([place]), CHA2DS2-VASc Score 5 07/22/2016  . Pulmonary embolus ([place])   . Obesity   . Lung cancer ([place])   .  Hypertension     Past Surgical History:  Procedure Laterality Date  . APPENDECTOMY    . BASAL CELL CARCINOMA EXCISION     nose  . breast cyst removal     right  . CATARACT EXTRACTION Bilateral   . CHOLECYSTECTOMY    . LUNG REMOVAL, PARTIAL  2013   RML mass/carcinoid, Dr Lurena Nida  . TONSILLECTOMY AND ADENOIDECTOMY    . TOTAL VAGINAL HYSTERECTOMY      OB History    No data available       Home Medications    Prior to Admission medications   Medication Sig Start Date End Date Taking? Authorizing Provider  albuterol (PROVENTIL HFA;VENTOLIN HFA) 108 (90 Base) MCG/ACT inhaler Inhale 2 puffs into the lungs every 6 (six) hours as needed for wheezing or shortness of breath.   Yes Historical Provider, MD  apixaban (ELIQUIS) 5 MG TABS tablet Take 5 mg by mouth 2 (two) times daily.   Yes Historical Provider, MD  atorvastatin (LIPITOR) 20 MG tablet Take 20 mg by mouth daily.   Yes Historical Provider, MD  Calcium Carbonate-Vitamin D (CALCIUM 600+D) 600-400 MG-UNIT tablet Take 2 tablets by mouth daily.   Yes Historical Provider, MD  Cholecalciferol (VITAMIN D3) 1000 units CAPS Take 1 capsule by mouth daily.   Yes Historical Provider, MD  esomeprazole (NEXIUM) 40 MG capsule Take 40  mg by mouth daily at 12 noon.   Yes Historical Provider, MD  fenofibrate 160 MG tablet Take 160 mg by mouth daily.   Yes Historical Provider, MD  Ferrous Sulfate (IRON) 325 (65 Fe) MG TABS Take 1 tablet by mouth 2 (two) times daily.    Yes Historical Provider, MD  fluticasone (FLONASE) 50 MCG/ACT nasal spray Place 1 spray into both nostrils daily.   Yes Historical Provider, MD  folic acid (FOLVITE) 401 MCG tablet Take 400 mcg by mouth daily.   Yes Historical Provider, MD  ipratropium-albuterol (DUONEB) 0.5-2.5 (3) MG/3ML SOLN Take 3 mLs by nebulization 2 (two) times daily.   Yes Historical Provider, MD  LORazepam (ATIVAN) 0.5 MG tablet Take 0.5 mg by mouth every 8 (eight) hours as needed for anxiety.   Yes  Historical Provider, MD  metoprolol (LOPRESSOR) 100 MG tablet Take 100 mg by mouth 2 (two) times daily.   Yes Historical Provider, MD  montelukast (SINGULAIR) 10 MG tablet Take 10 mg by mouth daily.   Yes Historical Provider, MD  Multiple Vitamin (MULTIVITAMIN WITH MINERALS) TABS tablet Take 1 tablet by mouth daily.   Yes Historical Provider, MD  PARoxetine (PAXIL) 20 MG tablet Take 20 mg by mouth daily.   Yes Historical Provider, MD  pyridOXINE (VITAMIN B-6) 100 MG tablet Take 100 mg by mouth daily.   Yes Historical Provider, MD  vitamin B-12 (CYANOCOBALAMIN) 1000 MCG tablet Take 1,000 mcg by mouth daily.   Yes Historical Provider, MD  docusate sodium (COLACE) 100 MG capsule Take 100 mg by mouth daily as needed for mild constipation.    Historical Provider, MD  hydrocortisone (ANUSOL-HC) 25 MG suppository Place 25 mg rectally 2 (two) times daily.    Historical Provider, MD  trimethoprim (TRIMPEX) 100 MG tablet Take 100 mg by mouth 2 (two) times daily.    Historical Provider, MD    Family History Family History  Problem Relation Age of Onset  . Heart attack Mother     Dec 1987  . CVA Mother   . Hypertension Mother   . Multiple myeloma Mother   . Breast cancer Sister   . Skin cancer Sister   . Prostate cancer Brother   . Throat cancer Brother   . Cervical cancer Daughter   . Leukemia Daughter   . Leukemia Son   . Throat cancer Brother   . Cancer Brother     soft palette  . Colon cancer Neg Hx   . Pancreatic cancer Neg Hx     Social History Social History  Substance Use Topics  . Smoking status: Former Smoker    Quit date: 1976  . Smokeless tobacco: Never Used  . Alcohol use No     Allergies   Aleve [naproxen sodium]; Antihistamines, chlorpheniramine-type; Aspirin; Ciprofloxacin; Codeine; Penicillins; Rocephin [ceftriaxone sodium in dextrose]; Sulfa antibiotics; and Hydrochlorothiazide   Review of Systems Review of Systems  Constitutional: Negative for appetite change,  chills, diaphoresis, fatigue and fever.  HENT: Negative for mouth sores, sore throat and trouble swallowing.   Eyes: Negative for visual disturbance.  Respiratory: Negative for cough, chest tightness, shortness of breath and wheezing.   Cardiovascular: Negative for chest pain.  Gastrointestinal: Negative for abdominal distention, abdominal pain, diarrhea, nausea and vomiting.  Endocrine: Negative for polydipsia, polyphagia and polyuria.  Genitourinary: Negative for dysuria, frequency and hematuria.  Musculoskeletal: Negative for gait problem.  Skin: Negative for color change, pallor and rash.  Neurological: Negative for dizziness, syncope, light-headedness and headaches.  Hematological: Does not bruise/bleed easily.  Psychiatric/Behavioral: Negative for behavioral problems and confusion.     Physical Exam Updated Vital Signs BP 137/79 (BP Location: Left Arm)   Pulse 93   Temp 98.2 F (36.8 C) (Oral)   Resp 18   Ht 5' 4"  (1.626 m)   Wt 254 lb 9.6 oz (115.5 kg)   SpO2 98%   BMI 43.70 kg/m   Physical Exam  Constitutional: She is oriented to person, place, and time. She appears well-developed and well-nourished. No distress.  HENT:  Head: Normocephalic.  Eyes: Conjunctivae are normal. Pupils are equal, round, and reactive to light. No scleral icterus.  Neck: Normal range of motion. Neck supple. No thyromegaly present.  Cardiovascular: Normal rate and regular rhythm.  Exam reveals no gallop and no friction rub.   No murmur heard. Pulmonary/Chest: Effort normal and breath sounds normal. No respiratory distress. She has no wheezes. She has no rales.  Abdominal: Soft. Bowel sounds are normal. She exhibits no distension. There is no tenderness. There is no rebound.  Genitourinary:  Genitourinary Comments: Large perirectal mass.  Musculoskeletal: Normal range of motion.  Neurological: She is alert and oriented to person, place, and time.  Skin: Skin is warm and dry. No rash noted.    Psychiatric: She has a normal mood and affect. Her behavior is normal.     ED Treatments / Results  Labs (all labs ordered are listed, but only abnormal results are displayed) Labs Reviewed  BASIC METABOLIC PANEL - Abnormal; Notable for the following:       Result Value   CO2 33 (*)    Glucose, Bld 124 (*)    Creatinine, Ser 1.05 (*)    GFR calc non Af Amer 48 (*)    GFR calc Af Amer 55 (*)    All other components within normal limits  CBC - Abnormal; Notable for the following:    RBC 3.67 (*)    Hemoglobin 9.6 (*)    HCT 31.9 (*)    All other components within normal limits  CBC WITH DIFFERENTIAL/PLATELET - Abnormal; Notable for the following:    RBC 3.43 (*)    Hemoglobin 8.8 (*)    HCT 29.9 (*)    MCH 25.7 (*)    MCHC 29.4 (*)    All other components within normal limits  TROPONIN I - Abnormal; Notable for the following:    Troponin I 0.08 (*)    All other components within normal limits  TROPONIN I - Abnormal; Notable for the following:    Troponin I 0.09 (*)    All other components within normal limits  TROPONIN I - Abnormal; Notable for the following:    Troponin I 0.10 (*)    All other components within normal limits  BRAIN NATRIURETIC PEPTIDE - Abnormal; Notable for the following:    B Natriuretic Peptide 305.6 (*)    All other components within normal limits  COMPREHENSIVE METABOLIC PANEL - Abnormal; Notable for the following:    Glucose, Bld 101 (*)    Total Protein 5.4 (*)    Albumin 2.9 (*)    ALT 12 (*)    Alkaline Phosphatase 37 (*)    GFR calc non Af Amer 55 (*)    All other components within normal limits  HEPARIN LEVEL (UNFRACTIONATED) - Abnormal; Notable for the following:    Heparin Unfractionated >2.20 (*)    All other components within normal limits  PROTIME-INR - Abnormal; Notable for the  following:    Prothrombin Time 16.2 (*)    All other components within normal limits  APTT - Abnormal; Notable for the following:    aPTT 55 (*)    All  other components within normal limits  CBC WITH DIFFERENTIAL/PLATELET - Abnormal; Notable for the following:    RBC 3.50 (*)    Hemoglobin 9.1 (*)    HCT 30.8 (*)    MCHC 29.5 (*)    All other components within normal limits  HEPARIN LEVEL (UNFRACTIONATED) - Abnormal; Notable for the following:    Heparin Unfractionated 1.24 (*)    All other components within normal limits  APTT - Abnormal; Notable for the following:    aPTT 65 (*)    All other components within normal limits  HEPARIN LEVEL (UNFRACTIONATED) - Abnormal; Notable for the following:    Heparin Unfractionated 1.02 (*)    All other components within normal limits  I-STAT TROPOININ, ED - Abnormal; Notable for the following:    Troponin i, poc 0.09 (*)    All other components within normal limits  MAGNESIUM  APTT  HEPARIN LEVEL (UNFRACTIONATED)  CBC WITH DIFFERENTIAL/PLATELET  APTT  TYPE AND SCREEN  ABO/RH    EKG  EKG Interpretation  Date/Time:  Monday July 22 2016 15:05:41 EST Ventricular Rate:  97 PR Interval:    QRS Duration: 68 QT Interval:  322 QTC Calculation: 408 R Axis:   28 Text Interpretation:  Atrial fibrillation Abnormal ECG Confirmed by Jeneen Rinks  MD, [place] ([id number]) on 07/22/2016 5:21:14 PM       Radiology Dg Chest 2 View  Result Date: 07/22/2016 CLINICAL DATA:  Shortness of breath and anemia EXAM: CHEST  2 VIEW COMPARISON:  None. FINDINGS: There is mild bilateral interstitial thickening likely chronic. Mild area of linear airspace disease in the right mid lung likely reflecting scarring. 8 mm right lower lobe pulmonary nodule. 7 mm right upper lobe pulmonary nodule. There is no pleural effusion or pneumothorax. The heart and mediastinal contours are unremarkable. The osseous structures are unremarkable. IMPRESSION: No active cardiopulmonary disease. 8 mm right lower lobe and mm right upper lobe pulmonary nodule. Recommend further evaluation with a nonemergent CT of the chest. Electronically Signed    By: Kathreen Devoid   On: 07/22/2016 16:56   Ct Abdomen Pelvis W Contrast  Result Date: 07/23/2016 CLINICAL DATA:  Poor historian bright red blood per rectum anemia and rectal mass EXAM: CT ABDOMEN AND PELVIS WITH CONTRAST TECHNIQUE: Multidetector CT imaging of the abdomen and pelvis was performed using the standard protocol following bolus administration of intravenous contrast. CONTRAST:  168m ISOVUE-300 IOPAMIDOL (ISOVUE-300) INJECTION 61% COMPARISON:  None. FINDINGS: Lower chest: Linear scar or atelectasis within the lingula and left lower lobe posteriorly. Lobulated subpleural 1.4 cm density right lung base is contiguous with a more focal nodular density measuring 8.5 mm. No effusion or consolidation. Borderline heart size. No pericardial effusion. Coronary artery calcifications. Hepatobiliary: No focal hepatic abnormality. Gallbladder not well identified and is presumably surgically absent. No biliary dilatation. Pancreas: Unremarkable. No pancreatic ductal dilatation or surrounding inflammatory changes. Spleen: Normal in size without focal abnormality. Adrenals/Urinary Tract: Adrenal glands are within normal limits. Left kidney unremarkable. Cortical irregularity right kidney could relate to focal scarring. Mildly enlarged right extrarenal pelvis with prominent right ureter. No calcified stones seen along the course of the right ureter. Bladder is unremarkable. Stomach/Bowel: The stomach is nonenlarged. There is no dilated small bowel. There is diverticular disease of the colon. There is  irregular posterior wall thickening of the rectum, this is contiguous with a partially visualized soft tissue mass which measures 5.1 by 5.7 cm. Vascular/Lymphatic: Atherosclerosis of the aorta. No enlarged retroperitoneal nodes. There are multiple abnormally enlarged inguinal lymph nodes, on the right this measures 3.7 by 2.3 cm and on the left, this measures 4.3 by 2.2 cm. Reproductive: Status post hysterectomy. No  adnexal masses. Other: No free air or free fluid. Musculoskeletal: Degenerative changes of the spine. No suspicious bone lesions. IMPRESSION: 1. Irregular posterior rectal wall thickening which is contiguous with a partially visualized posterior rectal/anal soft tissue mass measuring at least 5.7 cm. 2. Multiple abnormally enlarged bilateral inguinal lymph nodes as described above. Metastatic disease cannot be excluded. 3. Colon diverticular disease without acute inflammation 4. Probable scarring involving right kidney. Enlarged right extrarenal pelvis and prominent right ureter without calcified stone along the course of the right ureter. 5. Right lower lobe nodular density measuring 8.5 mm. Consider dedicated chest CT for further evaluation. Electronically Signed   By: Donavan Foil M.D.   On: 07/23/2016 16:05    Procedures Procedures (including critical care time)  Medications Ordered in ED Medications  albuterol (PROVENTIL) (2.5 MG/3ML) 0.083% nebulizer solution 2.5 mg (not administered)  atorvastatin (LIPITOR) tablet 20 mg (20 mg Oral Given 07/23/16 0949)  pantoprazole (PROTONIX) EC tablet 40 mg (40 mg Oral Given 07/23/16 0949)  ferrous sulfate tablet 325 mg (325 mg Oral Given 07/23/16 1646)  fluticasone (FLONASE) 50 MCG/ACT nasal spray 1 spray (1 spray Each Nare Given 51/10/21 1173)  folic acid (FOLVITE) tablet 0.5 mg (0.5 mg Oral Given 07/23/16 0949)  ipratropium-albuterol (DUONEB) 0.5-2.5 (3) MG/3ML nebulizer solution 3 mL (3 mLs Nebulization Given 07/23/16 0957)  LORazepam (ATIVAN) tablet 0.5 mg (0.5 mg Oral Given 07/23/16 1738)  montelukast (SINGULAIR) tablet 10 mg (10 mg Oral Given 07/23/16 0949)  PARoxetine (PAXIL) tablet 20 mg (20 mg Oral Given 07/22/16 2136)  vitamin B-12 (CYANOCOBALAMIN) tablet 1,000 mcg (1,000 mcg Oral Given 07/23/16 0949)  acetaminophen (TYLENOL) tablet 650 mg (not administered)    Or  acetaminophen (TYLENOL) suppository 650 mg (not administered)    senna-docusate (Senokot-S) tablet 1 tablet (1 tablet Oral Given 07/23/16 0950)  bisacodyl (DULCOLAX) suppository 10 mg (not administered)  ondansetron (ZOFRAN) tablet 4 mg (not administered)    Or  ondansetron (ZOFRAN) injection 4 mg (not administered)  polyethylene glycol (MIRALAX / GLYCOLAX) packet 17 g (17 g Oral Not Given 07/23/16 1000)  heparin ADULT infusion 100 units/mL (25000 units/273m sodium chloride 0.45%) (1,300 Units/hr Intravenous Rate/Dose Change 07/23/16 1021)  lip balm (BLISTEX) ointment (1 application Topical Given 07/23/16 1020)  hydrocortisone cream 1 % (1 application Topical Given 07/23/16 1739)  iopamidol (ISOVUE-300) 61 % injection (100 mLs  Contrast Given 07/23/16 1405)     Initial Impression / Assessment and Plan / ED Course  I have reviewed the triage vital signs and the nursing notes.  Pertinent labs & imaging results that were available during my care of the patient were reviewed by me and considered in my medical decision making (see chart for details).  Clinical Course     D/W Hospitalist.  Will admit for pre-op eval.  Final Clinical Impressions(s) / ED Diagnoses   Final diagnoses:  Other pulmonary embolism without acute cor pulmonale, unspecified chronicity [place]  Obesity due to excess calories, unspecified classification, unspecified whether serious comorbidity present  Malignant neoplasm of right lung, unspecified part of lung ([place]  Essential hypertension  Mass of anus  New Prescriptions Current Discharge Medication List       Tanna Furry, MD 07/23/16 1805

## 2016-07-22 NOTE — Progress Notes (Signed)
Chief Complaint: Constipation and Hemorrhoids  HPI:  Tricia Hernandez is an 82 year old female with a past medical history of A. fib maintained on Eliquis, diverticular disease, GERD, hyperlipidemia, hypertension, lung cancer and pulmonary embolus,  who was referred to our office by Dr. Harmon Pier for a complaint of constipation and hemorrhoids. Patient was originally referred to Dr. Carlean Purl.   Per outside record review patient previously followed with Dr. [place] Carbo. Patient had a colonoscopy on 04/25/14 which revealed internal hemorrhoids and extensive sigmoid diverticulosis. There was a small sigmoid colon polyp. Pathology revealed an adenomatous polyp of the sigmoid colon.   Today, the patient presents to clinic accompanied by her daughter and tells me that she has been dealing with a "hemorrhoid" and rectal pain since this past summer, over the past 6 months. She has been to see her primary care doctor as well as a "specialist" per her report and a both gave her Anusol suppositories and some hydrocortisone cream. The patient has been doing this off and on for a total of 3 refills of her prescription , which sound like they were for a week at a time, as well as sitz bath twice a day, but the patient tells me that she has continued with rectal pain which has actually gotten worse within the past few weeks.    Most recently patient also tells me that she was told she was anemic. She went to the ER for further evaluation and was found to have a hemoglobin of "9", we do not have report from this either. The patient tells me that she does see some bright red blood when she wipes and chronically has a black looking stool, but believes this is due to her iron usage. She does have trouble with intermittent constipation as well as nausea. She has had a weight loss of 10 pounds within the past few months per her daughter.   Most concerning to the patient is a rectal pain which she experiences constantly, this is an  8-9 /10, this is made worse when she is standing up as a "pressure seems to build", this is also worse after a bowel movement, nothing really seems to help this pain. She tells me that she has felt something in her rectum, this is a large ball which she tells me is a hemorrhoid. This has been at least 2 fingers, approximately 4 cm across, in width since it started. She tells me that 1 day it just "fell out" of her rectum and has been there ever since.   Patient denies fever, chills, fatigue, anorexia, vomiting, heartburn, reflux, dysphagia or symptoms that awaken her at night.  Past Medical History:  Diagnosis Date  . Anxiety   . Atrial fibrillation ([place])   . Chronic bronchitis ([place])   . Chronic depression   . Diverticular disease   . Fibrocystic disease of breast   . Fracture of shoulder   . GERD (gastroesophageal reflux disease)   . Herpes zoster   . Hyperlipidemia   . Hypertension   . Labyrinthitis   . Lung cancer ([place])   . Obesity   . Paroxysmal supraventricular tachycardia ([place])   . Pulmonary embolus ([place])   . Vitamin D deficiency     Past Surgical History:  Procedure Laterality Date  . APPENDECTOMY    . BASAL CELL CARCINOMA EXCISION     nose  . breast cyst removal     right  . CATARACT EXTRACTION Bilateral   . CHOLECYSTECTOMY    .  LUNG REMOVAL, PARTIAL  2013   RML mass/carcinoid, Dr Lurena Nida  . TONSILLECTOMY AND ADENOIDECTOMY    . TOTAL VAGINAL HYSTERECTOMY      Current Outpatient Prescriptions  Medication Sig Dispense Refill  . albuterol (PROVENTIL HFA;VENTOLIN HFA) 108 (90 Base) MCG/ACT inhaler Inhale 2 puffs into the lungs every 6 (six) hours as needed for wheezing or shortness of breath.    Marland Kitchen apixaban (ELIQUIS) 5 MG TABS tablet Take 5 mg by mouth 2 (two) times daily.    Marland Kitchen atorvastatin (LIPITOR) 20 MG tablet Take 20 mg by mouth daily.    . Calcium Carbonate-Vitamin D (CALCIUM 600+D) 600-400 MG-UNIT tablet Take 2 tablets by mouth daily.    . Cholecalciferol  (VITAMIN D3) 1000 units CAPS Take 1 capsule by mouth daily.    Marland Kitchen docusate sodium (COLACE) 100 MG capsule Take 100 mg by mouth daily as needed for mild constipation.    Marland Kitchen esomeprazole (NEXIUM) 40 MG capsule Take 40 mg by mouth daily at 12 noon.    . fenofibrate 160 MG tablet Take 160 mg by mouth daily.    . Ferrous Sulfate (IRON) 325 (65 Fe) MG TABS Take 1 tablet by mouth daily.    . fluticasone (FLONASE) 50 MCG/ACT nasal spray Place 1 spray into both nostrils daily.    . folic acid (FOLVITE) 761 MCG tablet Take 400 mcg by mouth daily.    . hydrocortisone (ANUSOL-HC) 25 MG suppository Place 25 mg rectally 2 (two) times daily.    Marland Kitchen ipratropium-albuterol (DUONEB) 0.5-2.5 (3) MG/3ML SOLN Take 3 mLs by nebulization 2 (two) times daily.    Marland Kitchen LORazepam (ATIVAN) 0.5 MG tablet Take 0.5 mg by mouth every 8 (eight) hours as needed for anxiety.    . metoprolol (LOPRESSOR) 100 MG tablet Take 100 mg by mouth 2 (two) times daily.    . montelukast (SINGULAIR) 10 MG tablet Take 10 mg by mouth daily.    . MULTIPLE VITAMIN PO Take 1 tablet by mouth daily.    Marland Kitchen PARoxetine (PAXIL) 20 MG tablet Take 20 mg by mouth daily.    Marland Kitchen pyridOXINE (VITAMIN B-6) 100 MG tablet Take 100 mg by mouth daily.    Marland Kitchen trimethoprim (TRIMPEX) 100 MG tablet Take 100 mg by mouth 2 (two) times daily.    . vitamin B-12 (CYANOCOBALAMIN) 1000 MCG tablet Take 1,000 mcg by mouth daily.     No current facility-administered medications for this visit.     Allergies as of 07/22/2016 - Review Complete 07/22/2016  Allergen Reaction Noted  . Aspirin  06/14/2016  . Ciprofloxacin  06/14/2016  . Codeine  06/14/2016  . Penicillins  06/14/2016  . Rocephin [ceftriaxone sodium in dextrose]  06/14/2016  . Sulfa antibiotics  06/14/2016    Family History  Problem Relation Age of Onset  . Heart attack Mother     Dec 1987  . CVA Mother   . Hypertension Mother   . Multiple myeloma Mother   . Breast cancer Sister   . Skin cancer Sister   . Prostate  cancer Brother   . Cervical cancer Daughter   . Leukemia Son     Social History   Social History  . Marital status: Single    Spouse name: N/A  . Number of children: N/A  . Years of education: N/A   Occupational History  . Not on file.   Social History Main Topics  . Smoking status: Never Smoker  . Smokeless tobacco: Not on file  . Alcohol  use Not on file  . Drug use: Unknown  . Sexual activity: Not on file   Other Topics Concern  . Not on file   Social History Narrative  . No narrative on file    Review of Systems:     Constitutional: Positive for 10 pound weight loss over the past 3-4 weeks No fever, chills, weakness or fatigue HEENT: Eyes: No change in vision               Ears, Nose, Throat:  No change in hearing  Skin: No rash or itching Cardiovascular: No chest pain Respiratory: Positive for chronic shortness of breath, patient tells me over since surgery for lung cancer No cough Gastrointestinal: See HPI and otherwise negative Genitourinary: No dysuria or change in urinary frequency Neurological: No headache, dizziness or syncope Musculoskeletal: No new muscle or joint pain Hematologic: Positive for rectal bleeding and bruising on her legs Psychiatric: No history of depression or anxiety   Physical Exam:  Vital signs: BP 104/76   Pulse 84   Ht _0  (1.626 m)   Wt 245 lb (111.1 kg)   BMI 42.05 kg/m    Constitutional:   Pleasant obese Caucasian female appears to be in mild discomfort, Well developed, Well nourished, alert and cooperative Head:  Normocephalic and atraumatic. Eyes:   PEERL, EOMI. No icterus. Conjunctiva pink. Ears:  Hard of hearing Neck:  Supple Throat: Oral cavity and pharynx without inflammation, swelling or lesion.  Respiratory: Respirations even and unlabored. Lungs clear to auscultation bilaterally.   No wheezes, crackles, or rhonchi.  Cardiovascular: Normal S1, S2. No MRG. Regular rate and rhythm. No peripheral edema, cyanosis or  pallor.  Gastrointestinal:  Obese, Soft, nondistended, nontender. No rebound or guarding. Normal bowel sounds. No appreciable masses or hepatomegaly. Rectal:  External exam: Revealed 4-5 cm rectal mass, firm, tender, there was also drainage of a liquidy dark stool and some bright red blood on exam; internal exam: Unable to be done as patient was in such discomfort when manipulating her external mass, could not even find the rectal opening Msk:  Symmetrical without gross deformities. Without edema, no deformity or joint abnormality.  Neurologic:  Alert and  oriented x4;  grossly normal neurologically.  Skin:   Dry and intact without significant lesions or rashes. Psychiatric:Demonstrates good judgement and reason without abnormal affect or behaviors.  No recent labs or imaging.  Assessment: 1. Rectal mass: This as been present for the past 6 months, the last time a rectal exam was done was 3-4 weeks ago, patient tells me she does not believe this mass has changed at all since that time, she has been occasionally feeling it. It has "always been big", this is at least 4-5 cm across and very tender to touch; question thrombosed hemorrhoidal tissue versus mass versus other 2. Rectal pain: With above 3. Weight loss: 10 pounds in the past 3 weeks per patient's daughter, concern for malignancy 4. Anemia: Per patient report hemoglobin was found to be 9 recently about 3 weeks ago, patient was told to go to the ER for a transfusion, but they did not give her one, no labs since then, we do not have these labs; consider blood loss from rectal mass/colorectal cancer versus other  Plan: 1. Requested recent notes and labs from patient's PCP 2. Emergent referral to [place] [facility] for further evaluation of very tender large rectal mass, appt set for 3 pm this afternoon. This case was discussed with Dr. Fuller Plan at time  of patient's visit and he also did a rectal exam with the same findings. Concern for thrombosed  hemorrhoid tissue on top of a mass? Discussed with patient that likely she would need examination under anesthesia, but this can be discussed today with the surgical team 3. Ordered CBC, CMP and iron studies 4. Patient to follow in our clinic in 2-3 weeks with myself or Dr. Carlean Purl. This appointment can be pushed back if she is still being worked up by the surgical team.  Ellouise Newer, PA-C [facility] 07/22/2016, 10:58 AM  Cc: Dr. Harmon Pier

## 2016-07-22 NOTE — ED Notes (Signed)
No lab draw pt moved to inpatient

## 2016-07-23 ENCOUNTER — Observation Stay (HOSPITAL_BASED_OUTPATIENT_CLINIC_OR_DEPARTMENT_OTHER): Payer: Medicare Other

## 2016-07-23 ENCOUNTER — Observation Stay (HOSPITAL_COMMUNITY): Payer: Medicare Other

## 2016-07-23 DIAGNOSIS — R748 Abnormal levels of other serum enzymes: Secondary | ICD-10-CM

## 2016-07-23 DIAGNOSIS — I248 Other forms of acute ischemic heart disease: Secondary | ICD-10-CM | POA: Diagnosis present

## 2016-07-23 DIAGNOSIS — E669 Obesity, unspecified: Secondary | ICD-10-CM | POA: Diagnosis present

## 2016-07-23 DIAGNOSIS — Z6841 Body Mass Index (BMI) 40.0 and over, adult: Secondary | ICD-10-CM | POA: Diagnosis not present

## 2016-07-23 DIAGNOSIS — C269 Malignant neoplasm of ill-defined sites within the digestive system: Secondary | ICD-10-CM | POA: Diagnosis not present

## 2016-07-23 DIAGNOSIS — C3490 Malignant neoplasm of unspecified part of unspecified bronchus or lung: Secondary | ICD-10-CM | POA: Diagnosis not present

## 2016-07-23 DIAGNOSIS — K660 Peritoneal adhesions (postprocedural) (postinfection): Secondary | ICD-10-CM | POA: Diagnosis not present

## 2016-07-23 DIAGNOSIS — H9190 Unspecified hearing loss, unspecified ear: Secondary | ICD-10-CM | POA: Diagnosis present

## 2016-07-23 DIAGNOSIS — Z515 Encounter for palliative care: Secondary | ICD-10-CM | POA: Diagnosis not present

## 2016-07-23 DIAGNOSIS — Z01810 Encounter for preprocedural cardiovascular examination: Secondary | ICD-10-CM

## 2016-07-23 DIAGNOSIS — E785 Hyperlipidemia, unspecified: Secondary | ICD-10-CM | POA: Diagnosis present

## 2016-07-23 DIAGNOSIS — C801 Malignant (primary) neoplasm, unspecified: Secondary | ICD-10-CM | POA: Diagnosis not present

## 2016-07-23 DIAGNOSIS — I482 Chronic atrial fibrillation: Secondary | ICD-10-CM | POA: Diagnosis present

## 2016-07-23 DIAGNOSIS — K629 Disease of anus and rectum, unspecified: Secondary | ICD-10-CM

## 2016-07-23 DIAGNOSIS — C218 Malignant neoplasm of overlapping sites of rectum, anus and anal canal: Secondary | ICD-10-CM | POA: Diagnosis present

## 2016-07-23 DIAGNOSIS — Z886 Allergy status to analgesic agent status: Secondary | ICD-10-CM | POA: Diagnosis not present

## 2016-07-23 DIAGNOSIS — I4891 Unspecified atrial fibrillation: Secondary | ICD-10-CM | POA: Diagnosis not present

## 2016-07-23 DIAGNOSIS — R0600 Dyspnea, unspecified: Secondary | ICD-10-CM | POA: Diagnosis present

## 2016-07-23 DIAGNOSIS — C211 Malignant neoplasm of anal canal: Secondary | ICD-10-CM | POA: Diagnosis not present

## 2016-07-23 DIAGNOSIS — M6281 Muscle weakness (generalized): Secondary | ICD-10-CM | POA: Diagnosis not present

## 2016-07-23 DIAGNOSIS — C3491 Malignant neoplasm of unspecified part of right bronchus or lung: Secondary | ICD-10-CM | POA: Diagnosis not present

## 2016-07-23 DIAGNOSIS — I471 Supraventricular tachycardia: Secondary | ICD-10-CM | POA: Diagnosis not present

## 2016-07-23 DIAGNOSIS — E6609 Other obesity due to excess calories: Secondary | ICD-10-CM | POA: Diagnosis not present

## 2016-07-23 DIAGNOSIS — R229 Localized swelling, mass and lump, unspecified: Secondary | ICD-10-CM | POA: Diagnosis present

## 2016-07-23 DIAGNOSIS — R0602 Shortness of breath: Secondary | ICD-10-CM | POA: Diagnosis not present

## 2016-07-23 DIAGNOSIS — R911 Solitary pulmonary nodule: Secondary | ICD-10-CM | POA: Diagnosis not present

## 2016-07-23 DIAGNOSIS — I481 Persistent atrial fibrillation: Secondary | ICD-10-CM | POA: Diagnosis not present

## 2016-07-23 DIAGNOSIS — J449 Chronic obstructive pulmonary disease, unspecified: Secondary | ICD-10-CM | POA: Diagnosis present

## 2016-07-23 DIAGNOSIS — C44520 Squamous cell carcinoma of anal skin: Secondary | ICD-10-CM | POA: Diagnosis not present

## 2016-07-23 DIAGNOSIS — E559 Vitamin D deficiency, unspecified: Secondary | ICD-10-CM | POA: Diagnosis present

## 2016-07-23 DIAGNOSIS — I2782 Chronic pulmonary embolism: Secondary | ICD-10-CM | POA: Diagnosis not present

## 2016-07-23 DIAGNOSIS — I5032 Chronic diastolic (congestive) heart failure: Secondary | ICD-10-CM | POA: Diagnosis present

## 2016-07-23 DIAGNOSIS — I480 Paroxysmal atrial fibrillation: Secondary | ICD-10-CM | POA: Diagnosis not present

## 2016-07-23 DIAGNOSIS — I110 Hypertensive heart disease with heart failure: Secondary | ICD-10-CM | POA: Diagnosis present

## 2016-07-23 DIAGNOSIS — K6289 Other specified diseases of anus and rectum: Secondary | ICD-10-CM | POA: Diagnosis not present

## 2016-07-23 DIAGNOSIS — I10 Essential (primary) hypertension: Secondary | ICD-10-CM

## 2016-07-23 DIAGNOSIS — K5790 Diverticulosis of intestine, part unspecified, without perforation or abscess without bleeding: Secondary | ICD-10-CM | POA: Diagnosis not present

## 2016-07-23 DIAGNOSIS — Z5331 Laparoscopic surgical procedure converted to open procedure: Secondary | ICD-10-CM | POA: Diagnosis not present

## 2016-07-23 DIAGNOSIS — R918 Other nonspecific abnormal finding of lung field: Secondary | ICD-10-CM | POA: Diagnosis not present

## 2016-07-23 DIAGNOSIS — K625 Hemorrhage of anus and rectum: Secondary | ICD-10-CM | POA: Diagnosis not present

## 2016-07-23 DIAGNOSIS — D649 Anemia, unspecified: Secondary | ICD-10-CM | POA: Diagnosis present

## 2016-07-23 DIAGNOSIS — F419 Anxiety disorder, unspecified: Secondary | ICD-10-CM | POA: Diagnosis present

## 2016-07-23 DIAGNOSIS — C210 Malignant neoplasm of anus, unspecified: Secondary | ICD-10-CM | POA: Diagnosis not present

## 2016-07-23 DIAGNOSIS — F39 Unspecified mood [affective] disorder: Secondary | ICD-10-CM | POA: Diagnosis present

## 2016-07-23 DIAGNOSIS — I2510 Atherosclerotic heart disease of native coronary artery without angina pectoris: Secondary | ICD-10-CM | POA: Diagnosis present

## 2016-07-23 DIAGNOSIS — Z510 Encounter for antineoplastic radiation therapy: Secondary | ICD-10-CM | POA: Diagnosis not present

## 2016-07-23 LAB — CBC WITH DIFFERENTIAL/PLATELET
Basophils Absolute: 0 10*3/uL (ref 0.0–0.1)
Basophils Relative: 0 %
EOS ABS: 0.1 10*3/uL (ref 0.0–0.7)
EOS PCT: 2 %
HCT: 30.8 % — ABNORMAL LOW (ref 36.0–46.0)
Hemoglobin: 9.1 g/dL — ABNORMAL LOW (ref 12.0–15.0)
LYMPHS ABS: 1.7 10*3/uL (ref 0.7–4.0)
LYMPHS PCT: 24 %
MCH: 26 pg (ref 26.0–34.0)
MCHC: 29.5 g/dL — AB (ref 30.0–36.0)
MCV: 88 fL (ref 78.0–100.0)
MONO ABS: 0.5 10*3/uL (ref 0.1–1.0)
Monocytes Relative: 8 %
Neutro Abs: 4.7 10*3/uL (ref 1.7–7.7)
Neutrophils Relative %: 66 %
PLATELETS: 199 10*3/uL (ref 150–400)
RBC: 3.5 MIL/uL — AB (ref 3.87–5.11)
RDW: 15.5 % (ref 11.5–15.5)
WBC: 7.1 10*3/uL (ref 4.0–10.5)

## 2016-07-23 LAB — ECHOCARDIOGRAM COMPLETE
HEIGHTINCHES: 64 in
WEIGHTICAEL: 4073.6 [oz_av]

## 2016-07-23 LAB — COMPREHENSIVE METABOLIC PANEL
ALT: 12 U/L — ABNORMAL LOW (ref 14–54)
ANION GAP: 7 (ref 5–15)
AST: 20 U/L (ref 15–41)
Albumin: 2.9 g/dL — ABNORMAL LOW (ref 3.5–5.0)
Alkaline Phosphatase: 37 U/L — ABNORMAL LOW (ref 38–126)
BUN: 9 mg/dL (ref 6–20)
CHLORIDE: 103 mmol/L (ref 101–111)
CO2: 31 mmol/L (ref 22–32)
CREATININE: 0.93 mg/dL (ref 0.44–1.00)
Calcium: 9 mg/dL (ref 8.9–10.3)
GFR, EST NON AFRICAN AMERICAN: 55 mL/min — AB (ref >60)
Glucose, Bld: 101 mg/dL — ABNORMAL HIGH (ref 65–99)
POTASSIUM: 4 mmol/L (ref 3.5–5.1)
SODIUM: 141 mmol/L (ref 135–145)
Total Bilirubin: 0.3 mg/dL (ref 0.3–1.2)
Total Protein: 5.4 g/dL — ABNORMAL LOW (ref 6.5–8.1)

## 2016-07-23 LAB — APTT
aPTT: 55 seconds — ABNORMAL HIGH (ref 24–36)
aPTT: 65 seconds — ABNORMAL HIGH (ref 24–36)

## 2016-07-23 LAB — IRON,TIBC AND FERRITIN PANEL
%SAT: 6 % — ABNORMAL LOW (ref 11–50)
Ferritin: 17 ng/mL — ABNORMAL LOW (ref 20–288)
IRON: 27 ug/dL — AB (ref 45–160)
TIBC: 459 ug/dL — ABNORMAL HIGH (ref 250–450)

## 2016-07-23 LAB — HEPARIN LEVEL (UNFRACTIONATED)
HEPARIN UNFRACTIONATED: 1.24 [IU]/mL — AB (ref 0.30–0.70)
Heparin Unfractionated: 1.02 IU/mL — ABNORMAL HIGH (ref 0.30–0.70)

## 2016-07-23 LAB — TROPONIN I
TROPONIN I: 0.09 ng/mL — AB (ref <0.03)
TROPONIN I: 0.1 ng/mL — AB (ref <0.03)

## 2016-07-23 LAB — MAGNESIUM: MAGNESIUM: 1.8 mg/dL (ref 1.7–2.4)

## 2016-07-23 MED ORDER — BLISTEX MEDICATED EX OINT
TOPICAL_OINTMENT | CUTANEOUS | Status: DC | PRN
  Administered 2016-07-23: 1 via TOPICAL
  Filled 2016-07-23: qty 6.3

## 2016-07-23 MED ORDER — HYDROCORTISONE 1 % EX CREA
TOPICAL_CREAM | Freq: Two times a day (BID) | CUTANEOUS | Status: DC
  Administered 2016-07-23: 1 via TOPICAL
  Administered 2016-07-24 (×2): via TOPICAL
  Filled 2016-07-23: qty 28

## 2016-07-23 MED ORDER — IOPAMIDOL (ISOVUE-300) INJECTION 61%
INTRAVENOUS | Status: AC
  Administered 2016-07-23: 100 mL
  Filled 2016-07-23: qty 100

## 2016-07-23 NOTE — Progress Notes (Signed)
Agree w/ Ms. Lemmon's assessment and plan. ? Grad IV hemorrhoid ? Malignancy

## 2016-07-23 NOTE — Care Management Note (Signed)
Case Management Note Marvetta Gibbons RN, BSN Unit 2W-Case Manager [phone]  Patient Details  Name: Yemariam Magar MRN: [id number] Date of Birth: [birth year]  Subjective/Objective:  Pt admitted with rectal mass                   Action/Plan: PTA pt lived at home- CM to follow for d/c needs, pt was on eliquis at home  Expected Discharge Date:                  Expected Discharge Plan:  [place]  In-House Referral:     Discharge planning Services  CM Consult  Post Acute Care Choice:    Choice offered to:     DME Arranged:    DME Agency:     HH Arranged:    [place] Agency:     Status of Service:  In process, will continue to follow  If discussed at Long Length of Stay Meetings, dates discussed:    Additional Comments:  Dawayne Patricia, RN 07/23/2016, 1:59 PM

## 2016-07-23 NOTE — Progress Notes (Signed)
ANTICOAGULATION CONSULT NOTE - Follow Up Consult  Pharmacy Consult for Heparin Indication: Hx of PE and afib.  Allergies  Allergen Reactions  . Aleve [Naproxen Sodium] Hives  . Antihistamines, Chlorpheniramine-Type     ANTIHISTAMINES  . Aspirin   . Ciprofloxacin   . Codeine   . Penicillins     Has patient had a PCN reaction causing immediate rash, facial/tongue/throat swelling, SOB or lightheadedness with hypotension: Yes Has patient had a PCN reaction causing severe rash involving mucus membranes or skin necrosis: No Has patient had a PCN reaction that required hospitalization No Has patient had a PCN reaction occurring within the last 10 years: No If all of the above answers are "NO", then may proceed with Cephalosporin use.   . Rocephin [Ceftriaxone Sodium In Dextrose]   . Sulfa Antibiotics   . Hydrochlorothiazide Rash    Patient Measurements: Height: '5\' 4"'$  (162.6 cm) Weight: 254 lb 9.6 oz (115.5 kg) IBW/kg (Calculated) : 54.7 Heparin Dosing Weight: 82.5 kg  Vital Signs: Temp: 98.2 F (36.8 C) (12/12 0955) Temp Source: Oral (12/12 0955) BP: 137/79 (12/12 0955) Pulse Rate: 93 (12/12 0955)  Labs:  Recent Labs  07/22/16 1147 07/22/16 1529 07/22/16 2028 07/23/16 0035 07/23/16 0653 07/23/16 1621  HGB 9.9* 9.6* 8.8*  --  9.1*  --   HCT 30.8* 31.9* 29.9*  --  30.8*  --   PLT 257.0 254 217  --  199  --   APTT  --   --  29  --  55* 65*  LABPROT  --   --  16.2*  --   --   --   INR  --   --  1.30  --   --   --   HEPARINUNFRC  --   --  >2.20*  --  1.24* 1.02*  CREATININE 1.07 1.05*  --   --  0.93  --   TROPONINI  --   --  0.08* 0.09* 0.10*  --     Estimated Creatinine Clearance: 57.2 mL/min (by C-G formula based on SCr of 0.93 mg/dL).   Medical History: Past Medical History:  Diagnosis Date  . Anxiety   . Atrial fibrillation ([place])   . Chronic bronchitis ([place])   . Chronic depression   . Diverticular disease   . Fibrocystic disease of breast   . Fracture of  shoulder   . GERD (gastroesophageal reflux disease)   . Herpes zoster   . Hyperlipidemia   . Hypertension   . Labyrinthitis   . Lung cancer ([place])   . Obesity   . Paroxysmal supraventricular tachycardia ([place])   . Pulmonary embolus ([place])   . Vitamin D deficiency     Medications: r/u med rec  Assessment: 82 y/o F presents with elevated troponin and rectal mass suspicious for cancer. Eliquis PTA for afib and history of PE. Last dose 12/11. Eliquis on hold for possible procedure and pharmacy is dosing heparin. Per notes patient has been experiencing incontinence with mixture of semi-solid blood and stool for several months. No bleeding noted from other sites. HgB low 9.1. Will avoid bolus and monitor closely.  Heparin drip 1300 uts/hr HL remains falsely elevated from apixaban interaction - will continue to uses aPTT for heparin dosing.  APTT 65sec just below goal 66 sec - will continue same rate as patient admitted with bleeding, has Hx of PE but not active clot and heparin drip started without bolus so suspect may still accumulate overnight.  Goal of Therapy:  Heparin level 0.3-0.7 units/ml  aptt 66-102 Monitor platelets by anticoagulation protocol: Yes   Plan:  Continue  heparin 1300 units/hr  Check heparin level, aPTT, and CBC daily  Bonnita Nasuti Pharm.D. CPP, BCPS Clinical Pharmacist [phone] 07/23/2016 5:58 PM

## 2016-07-23 NOTE — Consult Note (Addendum)
Cardiology Consult    Patient ID: Tricia Hernandez MRN: [id number], DOB/AGE: 09-07-1930   Admit date: 07/22/2016 Date of Consult: 07/23/2016  Primary Physician: Clinton Quant, MD Reason for Consult: Pre Op Primary Cardiologist: Dr. Gibson Ramp in [place], [place]  Requesting Provider: Dr. Hulen Skains  Patient Profile    Tricia Hernandez is a 82 year old female with a past medical history of HTN, HLD, PSVT, PE, PAF (On Eliquis). Patient has a rectal mass that needs further evaluation in the OR, cardiology was consulted for pre op clearance.   History of Present Illness    Tricia Hernandez has no prior history of ischemic heart disease. She does have a history of Afib, for which she is anticoagulated with Xarelto. Her last dose was 07/22/16.   She was seen by Dr. Fabio Asa (GI) on 07/22/16 with rectal pain and a rectal mass that was about 4cm across by digital exam. She has lost about 10 pounds in the past 3 weeks, so there is some concern for malignancy.   She was seen by Dr. Hulen Skains and will need to go to surgery to further evaluate rectal mass.   EKG shows Afib, rate controlled. She tells me that she is inactive at home. She walks mostly just from her recliner to the bathroom. She doesn't get out of the house much. She does get SOB with minimal activity. She denies ever having any chest pain.   Past Medical History   Past Medical History:  Diagnosis Date  . Anxiety   . Atrial fibrillation ([place])   . Chronic bronchitis ([place])   . Chronic depression   . Diverticular disease   . Fibrocystic disease of breast   . Fracture of shoulder   . GERD (gastroesophageal reflux disease)   . Herpes zoster   . Hyperlipidemia   . Hypertension   . Labyrinthitis   . Lung cancer ([place])   . Obesity   . Paroxysmal supraventricular tachycardia ([place])   . Pulmonary embolus ([place])   . Vitamin D deficiency     Past Surgical History:  Procedure Laterality Date  . APPENDECTOMY    . BASAL CELL CARCINOMA EXCISION     nose  . breast cyst removal     right  . CATARACT EXTRACTION Bilateral   . CHOLECYSTECTOMY    . LUNG REMOVAL, PARTIAL  2013   RML mass/carcinoid, Dr Lurena Nida  . TONSILLECTOMY AND ADENOIDECTOMY    . TOTAL VAGINAL HYSTERECTOMY       Allergies  Allergies  Allergen Reactions  . Aleve [Naproxen Sodium] Hives  . Antihistamines, Chlorpheniramine-Type     ANTIHISTAMINES  . Aspirin   . Ciprofloxacin   . Codeine   . Penicillins     Has patient had a PCN reaction causing immediate rash, facial/tongue/throat swelling, SOB or lightheadedness with hypotension: Yes Has patient had a PCN reaction causing severe rash involving mucus membranes or skin necrosis: No Has patient had a PCN reaction that required hospitalization No Has patient had a PCN reaction occurring within the last 10 years: No If all of the above answers are "NO", then may proceed with Cephalosporin use.   . Rocephin [Ceftriaxone Sodium In Dextrose]   . Sulfa Antibiotics   . Hydrochlorothiazide Rash    Inpatient Medications    . iopamidol      . atorvastatin  20 mg Oral Daily  . ferrous sulfate  325 mg Oral TID WC  . fluticasone  1 spray Each Nare Daily  . folic acid  0.5 mg Oral Daily  . ipratropium-albuterol  3 mL Nebulization BID  . montelukast  10 mg Oral Daily  . pantoprazole  40 mg Oral Daily  . PARoxetine  20 mg Oral Daily  . polyethylene glycol  17 g Oral Daily  . senna-docusate  1 tablet Oral BID  . vitamin B-12  1,000 mcg Oral Daily    Family History    Family History  Problem Relation Age of Onset  . Heart attack Mother     Dec 1987  . CVA Mother   . Hypertension Mother   . Multiple myeloma Mother   . Breast cancer Sister   . Skin cancer Sister   . Prostate cancer Brother   . Throat cancer Brother   . Cervical cancer Daughter   . Leukemia Daughter   . Leukemia Son   . Throat cancer Brother   . Cancer Brother     soft palette  . Colon cancer Neg Hx   . Pancreatic cancer Neg Hx      Social History    Social History   Social History  . Marital status: Single    Spouse name: N/A  . Number of children: N/A  . Years of education: N/A   Occupational History  . Not on file.   Social History Main Topics  . Smoking status: Former Smoker    Quit date: 1976  . Smokeless tobacco: Never Used  . Alcohol use No  . Drug use: No  . Sexual activity: Not on file   Other Topics Concern  . Not on file   Social History Narrative  . No narrative on file     Review of Systems    General:  No chills, fever, night sweats or weight changes.  Cardiovascular: + dyspnea on exertion; No chest pain, edema, orthopnea, palpitations, paroxysmal nocturnal dyspnea. Dermatological: No rash, lesions/masses Respiratory: No cough, dyspnea Urologic: No hematuria, dysuria Abdominal:   No nausea, vomiting, diarrhea, bright red blood per rectum, melena, or hematemesis Neurologic:  No visual changes, wkns, changes in mental status. All other systems reviewed and are otherwise negative except as noted above.  Physical Exam    Blood pressure 137/79, pulse 93, temperature 98.2 F (36.8 C), temperature source Oral, resp. rate 18, height _0  (1.626 m), weight 254 lb 9.6 oz (115.5 kg), SpO2 (!) 87 %.  General: Pleasant, NAD Psych: Normal affect. Neuro: Alert and oriented X 3. Moves all extremities spontaneously. HEENT: Normal  Neck: Supple without bruits or JVD. Lungs:  Resp regular and unlabored, CTA. Heart: irregularly irregular rhythm.Distant heart sounds with normal S1 and S2  1/6 SEM at RUSB.Marland Kitchen Abdomen: Soft, non-tender, non-distended, BS + x 4. Obese Extremities: No clubbing, cyanosis, trivial edema. DP/PT/Radials 2+ and equal bilaterally.  Labs    Troponin [facility] of Care Test)  Recent Labs  07/22/16 1536  TROPIPOC 0.09*    Recent Labs  07/22/16 2028 07/23/16 0035 07/23/16 0653  TROPONINI 0.08* 0.09* 0.10*   Lab Results  Component Value Date   WBC 7.1  07/23/2016   HGB 9.1 (L) 07/23/2016   HCT 30.8 (L) 07/23/2016   MCV 88.0 07/23/2016   PLT 199 07/23/2016    Recent Labs Lab 07/23/16 0653  NA 141  K 4.0  CL 103  CO2 31  BUN 9  CREATININE 0.93  CALCIUM 9.0  PROT 5.4*  BILITOT 0.3  ALKPHOS 37*  ALT 12*  AST 20  GLUCOSE 101*   No results found for:  CHOL, HDL, LDLCALC, TRIG No results found for: [facility]   Radiology Studies    Dg Chest 2 View  Result Date: 07/22/2016 CLINICAL DATA:  Shortness of breath and anemia EXAM: CHEST  2 VIEW COMPARISON:  None. FINDINGS: There is mild bilateral interstitial thickening likely chronic. Mild area of linear airspace disease in the right mid lung likely reflecting scarring. 8 mm right lower lobe pulmonary nodule. 7 mm right upper lobe pulmonary nodule. There is no pleural effusion or pneumothorax. The heart and mediastinal contours are unremarkable. The osseous structures are unremarkable. IMPRESSION: No active cardiopulmonary disease. 8 mm right lower lobe and mm right upper lobe pulmonary nodule. Recommend further evaluation with a nonemergent CT of the chest. Electronically Signed   By: Kathreen Devoid   On: 07/22/2016 16:56    EKG & Cardiac Imaging    EKG: Afib.   Echocardiogram:   Left ventricle: The cavity size was normal. Wall thickness was  increased in a pattern of moderate LVH. Systolic function was normal. The estimated ejection fraction was in the range of 60% to 65%. Wall motion was normal; there were no regional wall motion abnormalities.  Aortic valve: Mildly calcified annulus.  Right atrium: The atrium was moderately dilated.  Tricuspid valve: There was mild-moderate regurgitation.  Assessment & Plan    1. Evaluation of cardiac risk for surgery: Patient in need of surgery to evaluate rectal mass. She denies ischemic symptoms, however had to assess her activity level as she is largely inactive.  2. Elevated troponin: Minimal troponin elevation without any active anginal  symptoms. Probably related to her A. fib. Echocardiogram with no regional wall motion abnormality and normal EF. Would not pursue further. I do not think this is considered demand ischemia.  EchoEssentially normal with no regional wall motion and amount is. Normal EF . Further MD recommendations to follow.   Signed, Arbutus Leas, NP 07/23/2016, 1:52 PM Pager: [phone]   I have seen, examined and evaluated the patient this afternoon along with Jettie Booze, NP.  After reviewing all the available data and chart, we discussed the patients laboratory, study & physical findings as well as symptoms in detail. I agree with her findings, examination as well as impression recommendations as per our discussion.    Very pleasant elderly woman who has a rectal mass and needs to be removed surgically. She is obese and somewhat deconditioned and therefore has exertional dyspnea. She has a history of known atrial fibrillation currently not on her home [facility]) with IV heparin running. She has no sensation whatsoever being in atrial fibrillation unless he goes fast which is not common.  She was not aware but she is taking 100 mg twice a day of metoprolol at home which is not ordered.  We are asked to evaluate her preoperative risk.  I'm not sure whether surgery is going to be intraperitoneal or not, this would give it some increased risk, but otherwise she does not have any active anginal symptoms, heart failure symptoms, or history of stroke. She has normal renal function and does not have diabetes.  Overall she would be relatively low risk patient for a relatively low risk surgery (unless it is intraperitoneal which would increase the risk of surgery, but not her overall risk.   Less than 1% chance of adverse cardiac outcome - however would recommend restarting beta blocker perioperatively. - This will reduce cardiac risk, but also will help prevent tachycardic atrial fibrillation response  perioperatively. (If she is nothing  by mouth postop, would probably need IV beta blocker back up)  In the absence of any active anginal symptoms and only some exertional dyspnea which could easily be attributed to her deconditioning, I would not pursue any additional cardiac evaluation besides the echocardiogram was just done showing normal EF.  The regional wall motion abnormality is at all take any further ischemic evaluation would be recommended as it would only serve to delay her surgery.   We will continue to follow from a distance perioperatively. Please call us if you have any further questions.   Glenetta Hew, M.D., M.S. Interventional Cardiologist   Pager # [phone] Phone # [phone] [address] [place], [place] [address]

## 2016-07-23 NOTE — Progress Notes (Signed)
[facility] Progress Note     Subjective: Pt without new complaints. Resting in bed. No current SOB or CP. Pt denies nausea or vomiting. Pt states she is having "bubbles" from her rectum which is a mixture of blood and stool. Pt states she has been having loose stools and these "bubbles" for greater than 1 month.   Objective: Vital signs in last 24 hours: Temp:  [98.3 F (36.8 C)-99.1 F (37.3 C)] 98.3 F (36.8 C) (12/12 0532) Pulse Rate:  [84-100] 91 (12/12 0532) Resp:  [16-34] 20 (12/12 0532) BP: (103-126)/(40-76) 114/59 (12/12 0532) SpO2:  [93 %-96 %] 96 % (12/11 2026) Weight:  [245 lb (111.1 kg)-255 lb (115.7 kg)] 254 lb 9.6 oz (115.5 kg) (12/12 0532) Last BM Date: 07/22/16  Intake/Output from previous day: 12/11 0701 - 12/12 0700 In: 206.6 [P.O.:100; I.V.:106.6] Out: -  Intake/Output this shift: No intake/output data recorded.  PE: Gen:  Alert, NAD, pleasant, cooperative, obese elderly woman Card:  RRR, no M/G/R heard, 2 + radial and DP pulses bilaterally Pulm:  effort normal Abd: Soft, obese abdomin, NT/ND, +BS Skin: no rashes noted, warm and dry MS: mild BLE pitting edema  Lab Results:   Recent Labs  07/22/16 2028 07/23/16 0653  WBC 7.2 7.1  HGB 8.8* 9.1*  HCT 29.9* 30.8*  PLT 217 199   BMET  Recent Labs  07/22/16 1529 07/23/16 0653  NA 142 141  K 4.0 4.0  CL 103 103  CO2 33* 31  GLUCOSE 124* 101*  BUN 11 9  CREATININE 1.05* 0.93  CALCIUM 9.4 9.0   PT/INR  Recent Labs  07/22/16 2028  LABPROT 16.2*  INR 1.30   CMP     Component Value Date/Time   NA 141 07/23/2016 0653   K 4.0 07/23/2016 0653   CL 103 07/23/2016 0653   CO2 31 07/23/2016 0653   GLUCOSE 101 (H) 07/23/2016 0653   BUN 9 07/23/2016 0653   CREATININE 0.93 07/23/2016 0653   CALCIUM 9.0 07/23/2016 0653   PROT 5.4 (L) 07/23/2016 0653   ALBUMIN 2.9 (L) 07/23/2016 0653   AST 20 07/23/2016 0653   ALT 12 (L) 07/23/2016 0653   ALKPHOS 37 (L) 07/23/2016 0653   BILITOT 0.3 07/23/2016 0653   GFRNONAA 55 (L) 07/23/2016 0653   GFRAA >60 07/23/2016 0653   Lipase  No results found for: LIPASE     Studies/Results: Dg Chest 2 View  Result Date: 07/22/2016 CLINICAL DATA:  Shortness of breath and anemia EXAM: CHEST  2 VIEW COMPARISON:  None. FINDINGS: There is mild bilateral interstitial thickening likely chronic. Mild area of linear airspace disease in the right mid lung likely reflecting scarring. 8 mm right lower lobe pulmonary nodule. 7 mm right upper lobe pulmonary nodule. There is no pleural effusion or pneumothorax. The heart and mediastinal contours are unremarkable. The osseous structures are unremarkable. IMPRESSION: No active cardiopulmonary disease. 8 mm right lower lobe and mm right upper lobe pulmonary nodule. Recommend further evaluation with a nonemergent CT of the chest. Electronically Signed   By: Kathreen Devoid   On: 07/22/2016 16:56    Anti-infectives: Anti-infectives    None       Assessment/Plan  Rectal pain and bleeding/Possible rectal mass vs hemorrhoids- Exam under anesthesia this admission likely tomorrow or Thursday due to dose of Eliquis yesterday  Atrial fibrillation/PSVT on Eliquis- transitioned to hep gtt in anticipation of procedure, cards eval pending, last dose of Eliquis was 12/11 7AM. Hx of PE  Hypertension Hx of lung cancer/Bronchitits  VTE: SCD's, Heparin FEN: per primary then NPO the midnight prior to surgery  Plan: Will take pt to the OR likely tomorrow or Thursday to further evaluate her rectal mass.  Cardiology consult pending for pre-op clearance.    LOS: 0 days    Kalman Drape , [facility] 07/23/2016, 7:54 AM Pager: [phone] Consults: [phone] Mon-Fri 7:00 am-4:30 pm Sat-Sun 7:00 am-11:30 am

## 2016-07-23 NOTE — Progress Notes (Signed)
ANTICOAGULATION CONSULT NOTE - Initial Consult  Pharmacy Consult for Heparin Indication: PE and afib.  Allergies  Allergen Reactions  . Aleve [Naproxen Sodium] Hives  . Antihistamines, Chlorpheniramine-Type     ANTIHISTAMINES  . Aspirin   . Ciprofloxacin   . Codeine   . Penicillins     Has patient had a PCN reaction causing immediate rash, facial/tongue/throat swelling, SOB or lightheadedness with hypotension: Yes Has patient had a PCN reaction causing severe rash involving mucus membranes or skin necrosis: No Has patient had a PCN reaction that required hospitalization No Has patient had a PCN reaction occurring within the last 10 years: No If all of the above answers are "NO", then may proceed with Cephalosporin use.   . Rocephin [Ceftriaxone Sodium In Dextrose]   . Sulfa Antibiotics   . Hydrochlorothiazide Rash    Patient Measurements: Height: '5\' 4"'$  (162.6 cm) Weight: 254 lb 9.6 oz (115.5 kg) IBW/kg (Calculated) : 54.7 Heparin Dosing Weight: 82.5 kg  Vital Signs: Temp: 98.2 F (36.8 C) (12/12 0955) Temp Source: Oral (12/12 0955) BP: 137/79 (12/12 0955) Pulse Rate: 93 (12/12 0955)  Labs:  Recent Labs  07/22/16 1147 07/22/16 1529 07/22/16 2028 07/23/16 0035 07/23/16 0653  HGB 9.9* 9.6* 8.8*  --  9.1*  HCT 30.8* 31.9* 29.9*  --  30.8*  PLT 257.0 254 217  --  199  APTT  --   --  29  --  55*  LABPROT  --   --  16.2*  --   --   INR  --   --  1.30  --   --   HEPARINUNFRC  --   --  >2.20*  --  1.24*  CREATININE 1.07 1.05*  --   --  0.93  TROPONINI  --   --  0.08* 0.09* 0.10*    Estimated Creatinine Clearance: 57.2 mL/min (by C-G formula based on SCr of 0.93 mg/dL).   Medical History: Past Medical History:  Diagnosis Date  . Anxiety   . Atrial fibrillation ([place])   . Chronic bronchitis ([place])   . Chronic depression   . Diverticular disease   . Fibrocystic disease of breast   . Fracture of shoulder   . GERD (gastroesophageal reflux disease)   . Herpes  zoster   . Hyperlipidemia   . Hypertension   . Labyrinthitis   . Lung cancer ([place])   . Obesity   . Paroxysmal supraventricular tachycardia ([place])   . Pulmonary embolus ([place])   . Vitamin D deficiency     Medications: r/u med rec  Assessment: 82 y/o F presents with elevated troponin and rectal mass suspicious for cancer. Eliquis PTA for afib and history of PE. Last dose 12/11. Eliquis on hold for possible procedure and pharmacy is dosing heparin. Per notes patient has been experiencing incontinence with mixture of semi-solid blood and stool for several months. No bleeding noted from other sites. HgB low 9.1. Will avoid bolus and monitor closely.   aPTT: 55 Heparin Level: 101  Goal of Therapy:  Heparin level 0.3-0.7 units/ml  aptt 66-102 Monitor platelets by anticoagulation protocol: Yes   Plan:  Increase heparin to 1300 units/hr Check HL and aPTT in 6 hrs  Check heparin level, aPTT, and CBC daily  Georga Bora, PharmD Clinical Pharmacist Pager: [phone] 07/23/2016 10:16 AM

## 2016-07-23 NOTE — Progress Notes (Signed)
[facility] Progress Note  Patient: Tricia Hernandez FOY:[id number]   PCP: Clinton Quant, MD DOB: 04/08/1931   DOA: 07/22/2016   DOS: 07/23/2016   Date of Service: the patient was seen and examined on 07/23/2016  Brief hospital course: Pt. with PMH of pulmonary embolism 3 years ago, lung cancer S/P resection 2013, HTN, unknown CHF, morbid obesity, dyslipidemia, chronic anticoagulation.; admitted on 07/22/2016, with complaint of Shortness of breath and fatigue as well as BRBPR with a rectal mass. Currently further plan is await cardiac clearance.  Assessment and Plan: 1. Bright red blood per rectum. Symptomatic anemia. Rectal mass. Patient presented with complains of fatigue and tiredness.  Has a large rectal mass with slow oozing. H&H relatively stable per family. General surgery is consulted. We will follow recommendation. Continue Anusol suppository as well as sitz bath when necessary. Continue MiraLAX stool softener. She will remain on clear liquid diet at present. Transfuse for hemoglobin less than 7. Continue iron supplementation as well. Monitor closely while the patient will remain on therapeutic and physical examination.  2. Elevated troponin. History of CHF. HTN. Soft blood pressure. Dyspnea on exertion Likely patient's dyspnea is associated with anemia. Also elevated troponin is also associated with anemia. Follow echocardiogram. Cardiology consulted. She had a stress test in April 2017. Cardiologist Dr. Carrolyn Leigh.  Obtain records from [facility]. Unit clerk and RN informed. Continue therapeutic dose of heparin. Pharmacy to monitor. Holding patient's antihypertensive medication given patient's soft blood pressure and also holding placing the patient on IV fluids given history of CHF and current euvolemic state.  3. History of PE. A. fib. CHA2DS2-VASc Score 5 Patient has history of PE and A. fib and has been on anticoagulation with  Apixaban. Currently we'll hold Apixaban and transitioned to heparin for therapeutic and regulation. A. fib is currently rate controlled. Monitor serial troponin.  4. Pulmonary nodule. History of lung cancer. 36m Return nodule in the right lower lobe and 7 mm right upper lobe pulmonary nodule. Patient does have history of right sided lung cancer S/P surgical resection in the past 2013. Recommend outpatient nonemergent CT of the chest.  5. Possible COPD. It appears that the patient may have possible COPD given her medical regimen. I would continue her home nebulizer as well as inhalers.  6. History of anxiety. Mood disorder. Continuing home regimen  Pain management: When necessary Tylenol Activity: Consulted physical therapy Bowel regimen: last BM prior to admission Diet: Clear liquid diet DVT Prophylaxis: mechanical compression device  Advance goals of care discussion: Full code  Family Communication: family was present at bedside, at the time of interview. The pt provided permission to discuss medical plan with the family. Opportunity was given to ask question and all questions were answered satisfactorily.   Disposition:  Discharge to home. Expected discharge date: 07/26/2016  Consultants: Gen. surgery, cardiology Procedures: Echocardiogram  Antibiotics: Anti-infectives    None      Subjective: Complains about rectal discomfort. No abdominal pain. Shortness of breath is improved.  Objective: Physical Exam: Vitals:   07/23/16 0532 07/23/16 0955 07/23/16 0957 07/23/16 1010  BP: (!) 114/59 137/79    Pulse: 91 93    Resp: 20 18    Temp: 98.3 F (36.8 C) 98.2 F (36.8 C)    TempSrc: Oral Oral    SpO2:  92% (!) 87% 98%  Weight: 115.5 kg (254 lb 9.6 oz)     Height:        Intake/Output Summary (Last 24 hours) at  07/23/16 1630 Last data filed at 07/23/16 0800  Gross per 24 hour  Intake           206.57 ml  Output                0 ml  Net           206.57 ml    Filed Weights   07/22/16 1511 07/22/16 2026 07/23/16 0532  Weight: 115.7 kg (255 lb) 114.6 kg (252 lb 11.2 oz) 115.5 kg (254 lb 9.6 oz)    General: Alert, Awake and Oriented to Time, Place and Person. Appear in mild distress, affect appropriate Eyes: PERRL, Conjunctiva normal ENT: Oral Mucosa clear moist. Neck: difficult to assess JVD, no Abnormal Mass Or lumps Cardiovascular: S1 and S2 Present, no Murmur, Respiratory: Bilateral Air entry equal and Decreased, no use of accessory muscle, Clear to Auscultation, no Crackles, no wheezes Abdomen: Bowel Sound present, Soft and no tenderness Skin: no redness, no Rash, no induration Extremities: trace Pedal edema, no calf tenderness Neurologic: Grossly no focal neuro deficit. Bilaterally Equal motor strength  Data Reviewed: CBC:  Recent Labs Lab 07/22/16 1147 07/22/16 1529 07/22/16 2028 07/23/16 0653  WBC 8.9 7.9 7.2 7.1  NEUTROABS 6.8  --  4.8 4.7  HGB 9.9* 9.6* 8.8* 9.1*  HCT 30.8* 31.9* 29.9* 30.8*  MCV 82.9 86.9 87.2 88.0  PLT 257.0 254 217 696   Basic Metabolic Panel:  Recent Labs Lab 07/22/16 1147 07/22/16 1529 07/23/16 0653  NA 140 142 141  K 4.3 4.0 4.0  CL 102 103 103  CO2 32 33* 31  GLUCOSE 139* 124* 101*  BUN '12 11 9  '$ CREATININE 1.07 1.05* 0.93  CALCIUM 9.5 9.4 9.0  MG  --   --  1.8    Liver Function Tests:  Recent Labs Lab 07/22/16 1147 07/23/16 0653  AST 18 20  ALT 10 12*  ALKPHOS 41 37*  BILITOT 0.3 0.3  PROT 6.2 5.4*  ALBUMIN 3.5 2.9*   No results for input(s): LIPASE, AMYLASE in the last 168 hours. No results for input(s): AMMONIA in the last 168 hours. Coagulation Profile:  Recent Labs Lab 07/22/16 2028  INR 1.30   Cardiac Enzymes:  Recent Labs Lab 07/22/16 2028 07/23/16 0035 07/23/16 0653  TROPONINI 0.08* 0.09* 0.10*   BNP (last 3 results) No results for input(s): PROBNP in the last 8760 hours.  CBG: No results for input(s): GLUCAP in the last 168  hours.  Studies: Dg Chest 2 View  Result Date: 07/22/2016 CLINICAL DATA:  Shortness of breath and anemia EXAM: CHEST  2 VIEW COMPARISON:  None. FINDINGS: There is mild bilateral interstitial thickening likely chronic. Mild area of linear airspace disease in the right mid lung likely reflecting scarring. 8 mm right lower lobe pulmonary nodule. 7 mm right upper lobe pulmonary nodule. There is no pleural effusion or pneumothorax. The heart and mediastinal contours are unremarkable. The osseous structures are unremarkable. IMPRESSION: No active cardiopulmonary disease. 8 mm right lower lobe and mm right upper lobe pulmonary nodule. Recommend further evaluation with a nonemergent CT of the chest. Electronically Signed   By: Kathreen Devoid   On: 07/22/2016 16:56   Ct Abdomen Pelvis W Contrast  Result Date: 07/23/2016 CLINICAL DATA:  Poor historian bright red blood per rectum anemia and rectal mass EXAM: CT ABDOMEN AND PELVIS WITH CONTRAST TECHNIQUE: Multidetector CT imaging of the abdomen and pelvis was performed using the standard protocol following bolus administration of intravenous contrast.  CONTRAST:  186m ISOVUE-300 IOPAMIDOL (ISOVUE-300) INJECTION 61% COMPARISON:  None. FINDINGS: Lower chest: Linear scar or atelectasis within the lingula and left lower lobe posteriorly. Lobulated subpleural 1.4 cm density right lung base is contiguous with a more focal nodular density measuring 8.5 mm. No effusion or consolidation. Borderline heart size. No pericardial effusion. Coronary artery calcifications. Hepatobiliary: No focal hepatic abnormality. Gallbladder not well identified and is presumably surgically absent. No biliary dilatation. Pancreas: Unremarkable. No pancreatic ductal dilatation or surrounding inflammatory changes. Spleen: Normal in size without focal abnormality. Adrenals/Urinary Tract: Adrenal glands are within normal limits. Left kidney unremarkable. Cortical irregularity right kidney could relate  to focal scarring. Mildly enlarged right extrarenal pelvis with prominent right ureter. No calcified stones seen along the course of the right ureter. Bladder is unremarkable. Stomach/Bowel: The stomach is nonenlarged. There is no dilated small bowel. There is diverticular disease of the colon. There is irregular posterior wall thickening of the rectum, this is contiguous with a partially visualized soft tissue mass which measures 5.1 by 5.7 cm. Vascular/Lymphatic: Atherosclerosis of the aorta. No enlarged retroperitoneal nodes. There are multiple abnormally enlarged inguinal lymph nodes, on the right this measures 3.7 by 2.3 cm and on the left, this measures 4.3 by 2.2 cm. Reproductive: Status post hysterectomy. No adnexal masses. Other: No free air or free fluid. Musculoskeletal: Degenerative changes of the spine. No suspicious bone lesions. IMPRESSION: 1. Irregular posterior rectal wall thickening which is contiguous with a partially visualized posterior rectal/anal soft tissue mass measuring at least 5.7 cm. 2. Multiple abnormally enlarged bilateral inguinal lymph nodes as described above. Metastatic disease cannot be excluded. 3. Colon diverticular disease without acute inflammation 4. Probable scarring involving right kidney. Enlarged right extrarenal pelvis and prominent right ureter without calcified stone along the course of the right ureter. 5. Right lower lobe nodular density measuring 8.5 mm. Consider dedicated chest CT for further evaluation. Electronically Signed   By: KDonavan FoilM.D.   On: 07/23/2016 16:05     Scheduled Meds: . atorvastatin  20 mg Oral Daily  . ferrous sulfate  325 mg Oral TID WC  . fluticasone  1 spray Each Nare Daily  . folic acid  0.5 mg Oral Daily  . ipratropium-albuterol  3 mL Nebulization BID  . montelukast  10 mg Oral Daily  . pantoprazole  40 mg Oral Daily  . PARoxetine  20 mg Oral Daily  . polyethylene glycol  17 g Oral Daily  . senna-docusate  1 tablet Oral  BID  . vitamin B-12  1,000 mcg Oral Daily   Continuous Infusions: . heparin 1,300 Units/hr (07/23/16 1021)   PRN Meds: acetaminophen **OR** acetaminophen, albuterol, bisacodyl, lip balm, LORazepam, ondansetron **OR** ondansetron (ZOFRAN) IV  Time spent: 30 minutes  Author: PBerle Mull MD [facility] Pager: [phone] 106512/07/2016 4:30 PM  If 7PM-7AM, please contact night-coverage at [email or web address], password [email or web address]

## 2016-07-23 NOTE — Progress Notes (Signed)
Second bottle of Isovue mix given to patient will continue to monitor

## 2016-07-23 NOTE — Progress Notes (Signed)
Patient given 46m of Isovue mixed with 502m

## 2016-07-23 NOTE — Progress Notes (Signed)
  Echocardiogram 2D Echocardiogram has been performed.  Julio Storr L Androw 07/23/2016, 12:33 PM

## 2016-07-23 NOTE — Progress Notes (Signed)
Pt placed on 2LNC per O2 sat of 87% on RA, and pt appeared somewhat SOB. Pt feeling better on 2L. RT will continue to monitor.

## 2016-07-24 DIAGNOSIS — K625 Hemorrhage of anus and rectum: Secondary | ICD-10-CM

## 2016-07-24 DIAGNOSIS — D649 Anemia, unspecified: Secondary | ICD-10-CM

## 2016-07-24 LAB — CBC WITH DIFFERENTIAL/PLATELET
BASOS ABS: 0 10*3/uL (ref 0.0–0.1)
Basophils Relative: 0 %
EOS PCT: 1 %
Eosinophils Absolute: 0.1 10*3/uL (ref 0.0–0.7)
HCT: 31.3 % — ABNORMAL LOW (ref 36.0–46.0)
Hemoglobin: 9.3 g/dL — ABNORMAL LOW (ref 12.0–15.0)
LYMPHS PCT: 16 %
Lymphs Abs: 1.1 10*3/uL (ref 0.7–4.0)
MCH: 25.5 pg — AB (ref 26.0–34.0)
MCHC: 29.7 g/dL — AB (ref 30.0–36.0)
MCV: 86 fL (ref 78.0–100.0)
MONO ABS: 0.6 10*3/uL (ref 0.1–1.0)
Monocytes Relative: 9 %
Neutro Abs: 5.3 10*3/uL (ref 1.7–7.7)
Neutrophils Relative %: 74 %
PLATELETS: 226 10*3/uL (ref 150–400)
RBC: 3.64 MIL/uL — ABNORMAL LOW (ref 3.87–5.11)
RDW: 15.1 % (ref 11.5–15.5)
WBC: 7.2 10*3/uL (ref 4.0–10.5)

## 2016-07-24 LAB — COMPREHENSIVE METABOLIC PANEL
ALT: 12 U/L — ABNORMAL LOW (ref 14–54)
ANION GAP: 13 (ref 5–15)
AST: 20 U/L (ref 15–41)
Albumin: 2.8 g/dL — ABNORMAL LOW (ref 3.5–5.0)
Alkaline Phosphatase: 42 U/L (ref 38–126)
BUN: 7 mg/dL (ref 6–20)
CHLORIDE: 104 mmol/L (ref 101–111)
CO2: 24 mmol/L (ref 22–32)
Calcium: 8.6 mg/dL — ABNORMAL LOW (ref 8.9–10.3)
Creatinine, Ser: 0.85 mg/dL (ref 0.44–1.00)
GFR calc Af Amer: >60 mL/min (ref >60)
Glucose, Bld: 105 mg/dL — ABNORMAL HIGH (ref 65–99)
Potassium: 4 mmol/L (ref 3.5–5.1)
Sodium: 141 mmol/L (ref 135–145)
Total Bilirubin: 0.1 mg/dL — ABNORMAL LOW (ref 0.3–1.2)
Total Protein: 5.3 g/dL — ABNORMAL LOW (ref 6.5–8.1)

## 2016-07-24 LAB — APTT
APTT: 36 s (ref 24–36)
APTT: 98 s — AB (ref 24–36)

## 2016-07-24 LAB — HEPARIN LEVEL (UNFRACTIONATED)
HEPARIN UNFRACTIONATED: 0.51 [IU]/mL (ref 0.30–0.70)
Heparin Unfractionated: 0.84 IU/mL — ABNORMAL HIGH (ref 0.30–0.70)

## 2016-07-24 MED ORDER — FOLIC ACID 1 MG PO TABS
1.0000 mg | ORAL_TABLET | Freq: Every day | ORAL | Status: DC
  Administered 2016-07-24 – 2016-08-02 (×8): 1 mg via ORAL
  Filled 2016-07-24 (×10): qty 1

## 2016-07-24 MED ORDER — NITROFURANTOIN MONOHYD MACRO 100 MG PO CAPS: 100.0000 mg | ORAL_CAPSULE | Freq: Two times a day (BID) | ORAL | Status: DC

## 2016-07-24 MED ORDER — SODIUM CHLORIDE 0.9 % IV SOLN
1.0000 g | INTRAVENOUS | Status: AC
  Administered 2016-07-25: 1 g via INTRAVENOUS
  Filled 2016-07-24: qty 1

## 2016-07-24 NOTE — Progress Notes (Signed)
ANTICOAGULATION CONSULT NOTE - Follow Up Consult  Pharmacy Consult for Heparin Indication: Hx of PE and afib.  Allergies  Allergen Reactions  . Aleve [Naproxen Sodium] Hives  . Antihistamines, Chlorpheniramine-Type     ANTIHISTAMINES  . Aspirin   . Ciprofloxacin   . Codeine   . Penicillins     Has patient had a PCN reaction causing immediate rash, facial/tongue/throat swelling, SOB or lightheadedness with hypotension: Yes Has patient had a PCN reaction causing severe rash involving mucus membranes or skin necrosis: No Has patient had a PCN reaction that required hospitalization No Has patient had a PCN reaction occurring within the last 10 years: No If all of the above answers are "NO", then may proceed with Cephalosporin use.   . Rocephin [Ceftriaxone Sodium In Dextrose]   . Sulfa Antibiotics   . Hydrochlorothiazide Rash   Patient Measurements: Height: '5\' 4"'$  (162.6 cm) Weight: 252 lb 11.2 oz (114.6 kg) IBW/kg (Calculated) : 54.7 Heparin Dosing Weight: 82.5 kg  Vital Signs: Temp: 98.2 F (36.8 C) (12/13 1353) Temp Source: Oral (12/13 1353) BP: 138/65 (12/13 1353) Pulse Rate: 103 (12/13 1353)  Labs:  Recent Labs  07/22/16 1529  07/22/16 2028 07/23/16 0035 07/23/16 0653 07/23/16 1621 07/24/16 0248 07/24/16 1635  HGB 9.6*  --  8.8*  --  9.1*  --  9.3*  --   HCT 31.9*  --  29.9*  --  30.8*  --  31.3*  --   PLT 254  --  217  --  199  --  226  --   APTT  --   < > 29  --  55* 65* 36 98*  LABPROT  --   --  16.2*  --   --   --   --   --   INR  --   --  1.30  --   --   --   --   --   HEPARINUNFRC  --   < > >2.20*  --  1.24* 1.02* 0.51 0.84*  CREATININE 1.05*  --   --   --  0.93  --  0.85  --   TROPONINI  --   --  0.08* 0.09* 0.10*  --   --   --   < > = values in this interval not displayed.  Estimated Creatinine Clearance: 62.3 mL/min (by C-G formula based on SCr of 0.85 mg/dL).  Medical History: Past Medical History:  Diagnosis Date  . Anxiety   . Atrial  fibrillation ([place])   . Chronic bronchitis ([place])   . Chronic depression   . Diverticular disease   . Fibrocystic disease of breast   . Fracture of shoulder   . GERD (gastroesophageal reflux disease)   . Herpes zoster   . Hyperlipidemia   . Hypertension   . Labyrinthitis   . Lung cancer ([place])   . Obesity   . Paroxysmal supraventricular tachycardia ([place])   . Pulmonary embolus ([place])   . Vitamin D deficiency    Medications: r/u med rec  Assessment: 82 y/o F presents with elevated troponin and rectal mass suspicious for cancer. Eliquis PTA for afib and history of PE. Last dose 12/11. Eliquis on hold for procedure (12/14) and pharmacy is dosing heparin. Per notes patient has been experiencing incontinence with mixture of semi-solid blood and stool for several months. No bleeding noted from other sites. CBC stable with HgB low 9.3. Will avoid bolus and monitor closely.   Heparin level remains falsely  elevated this morning from apixaban- will continue to use aPTT for heparin dosing.  Aptt within range this evening at 98 seconds  Goal of Therapy:  Heparin level 0.3-0.7 units/ml  aptt 66-102 Monitor platelets by anticoagulation protocol: Yes   Plan:  Continue heparin at 1450 units / hr Follow up after surgery 12/14  Thank you Anette Guarneri, PharmD [phone] 07/24/2016 5:34 PM

## 2016-07-24 NOTE — Progress Notes (Signed)
PROGRESS NOTE [facility]   Tricia Hernandez   XLK:[id number] DOB: 09/03/1930  DOA: 07/22/2016 PCP: Clinton Quant, MD   Brief Narrative:  82 year old female with past medical history of hypertension, hyperlipidemia, PSVT, PE, PAF on Eliquis presented to the emergency room with complaining of shortness of breath and fatigue and the BRBPR with rectal mass. Currently undergoing for rectal mass workup. Surgery planning for biopsy in the morning.  Subjective: Patient seen and examined doing well have no complaints haven't had any recent bowel movement no more blood per rectum, denies shortness of breath and chest pain. Tolerating diet well.  Assessment & Plan: Bright red blood per rectum and rectal mass - denies pain or tenderness Symptomatic anemia - H&H stable Patient evaluated by general surgery which recommended rectal mass biopsy. Plan for tomorrow in the morning keep patient nothing by mouth after midnight. Continue anusol suppository and sitz baths necessary Continue MiraLAX Tolerating clear liquids Transfuse for hemoglobin less than 7.  Hypertension History of CHF Cardiology consulted for clearance for surgical procedure.  Echo showed EF of 60-65% no wall motion abnormality. Dyspnea has resolved likely to be associated with anemia. Elevated troponin likely secondary to demand ischemia versus A. fib  History of PE. A. fib. CHA2DS2-VASc Score 5 Currently holding Apixaban and transitioned to heparin ggt in view of surgery  Pulmonary nodule. History of lung cancer. 56m nodule in the right lower lobe and 7 mm right upper lobe pulmonary nodule. Patient does have history of right sided lung cancer S/P surgical resection in the past 2013. Recommend outpatient nonemergent CT of the chest.  DVT prophylaxis: Eliquis Code Status: Full Family Communication: Husband at bedside Disposition Plan: Home when medically stable.  Consultants:   Cardiology   Gen Surgery    Procedures:   ECHO  ------------------------------------------------------------------- Study Conclusions  - Left ventricle: The cavity size was normal. Wall thickness was   increased in a pattern of moderate LVH. Systolic function was   normal. The estimated ejection fraction was in the range of 60%   to 65%. Wall motion was normal; there were no regional wall   motion abnormalities.  Antimicrobials:  None    Objective: Vitals:   07/24/16 0530 07/24/16 0847 07/24/16 0954 07/24/16 1353  BP: 125/67  (!) 119/59 138/65  Pulse: (!) 109  (!) 115 (!) 103  Resp: '20  18 19  '$ Temp: 98.7 F (37.1 C)  98.4 F (36.9 C) 98.2 F (36.8 C)  TempSrc: Oral  Oral Oral  SpO2: 99% 96% 96% 99%  Weight: 114.6 kg (252 lb 11.2 oz)     Height:        Intake/Output Summary (Last 24 hours) at 07/24/16 1519 Last data filed at 07/24/16 1300  Gross per 24 hour  Intake              875 ml  Output                0 ml  Net              875 ml   Filed Weights   07/22/16 2026 07/23/16 0532 07/24/16 0530  Weight: 114.6 kg (252 lb 11.2 oz) 115.5 kg (254 lb 9.6 oz) 114.6 kg (252 lb 11.2 oz)    Examination:  General exam: Appears calm and comfortable  HEENT: AC/AT, PERRLA, OP moist and clear Respiratory system: Clear to auscultation. No wheezes,crackle or rhonchi Cardiovascular system: S1 & S2 heard, RRR. No JVD, murmurs, rubs or gallops Gastrointestinal system:  Abdomen is nondistended, soft and nontender. No organomegaly or masses felt. Normal bowel sounds heard. Central nervous system: Alert and oriented. No focal neurological deficits. Extremities: No pedal edema. Symmetric, strength 5/5   Skin: No rashes, lesions or ulcers Psychiatry: Judgement and insight appear normal. Mood & affect appropriate.    Data Reviewed: I have personally reviewed following labs and imaging studies  CBC:  Recent Labs Lab 07/22/16 1147 07/22/16 1529 07/22/16 2028 07/23/16 0653 07/24/16 0248  WBC 8.9 7.9  7.2 7.1 7.2  NEUTROABS 6.8  --  4.8 4.7 5.3  HGB 9.9* 9.6* 8.8* 9.1* 9.3*  HCT 30.8* 31.9* 29.9* 30.8* 31.3*  MCV 82.9 86.9 87.2 88.0 86.0  PLT 257.0 254 217 199 374   Basic Metabolic Panel:  Recent Labs Lab 07/22/16 1147 07/22/16 1529 07/23/16 0653 07/24/16 0248  NA 140 142 141 141  K 4.3 4.0 4.0 4.0  CL 102 103 103 104  CO2 32 33* 31 24  GLUCOSE 139* 124* 101* 105*  BUN '12 11 9 7  '$ CREATININE 1.07 1.05* 0.93 0.85  CALCIUM 9.5 9.4 9.0 8.6*  MG  --   --  1.8  --    GFR: Estimated Creatinine Clearance: 62.3 mL/min (by C-G formula based on SCr of 0.85 mg/dL). Liver Function Tests:  Recent Labs Lab 07/22/16 1147 07/23/16 0653 07/24/16 0248  AST '18 20 20  '$ ALT 10 12* 12*  ALKPHOS 41 37* 42  BILITOT 0.3 0.3 0.1*  PROT 6.2 5.4* 5.3*  ALBUMIN 3.5 2.9* 2.8*   No results for input(s): LIPASE, AMYLASE in the last 168 hours. No results for input(s): AMMONIA in the last 168 hours. Coagulation Profile:  Recent Labs Lab 07/22/16 2028  INR 1.30   Cardiac Enzymes:  Recent Labs Lab 07/22/16 2028 07/23/16 0035 07/23/16 0653  TROPONINI 0.08* 0.09* 0.10*   BNP (last 3 results) No results for input(s): PROBNP in the last 8760 hours. HbA1C: No results for input(s): HGBA1C in the last 72 hours. CBG: No results for input(s): GLUCAP in the last 168 hours. Lipid Profile: No results for input(s): CHOL, HDL, LDLCALC, TRIG, CHOLHDL, LDLDIRECT in the last 72 hours. Thyroid Function Tests: No results for input(s): TSH, T4TOTAL, FREET4, T3FREE, THYROIDAB in the last 72 hours. Anemia Panel:  Recent Labs  07/22/16 1147  FERRITIN 17*  TIBC 459*  IRON 27*  26*   Sepsis Labs: No results for input(s): PROCALCITON, LATICACIDVEN in the last 168 hours.  No results found for this or any previous visit (from the past 240 hour(s)).       Radiology Studies: Dg Chest 2 View  Result Date: 07/22/2016 CLINICAL DATA:  Shortness of breath and anemia EXAM: CHEST  2 VIEW  COMPARISON:  None. FINDINGS: There is mild bilateral interstitial thickening likely chronic. Mild area of linear airspace disease in the right mid lung likely reflecting scarring. 8 mm right lower lobe pulmonary nodule. 7 mm right upper lobe pulmonary nodule. There is no pleural effusion or pneumothorax. The heart and mediastinal contours are unremarkable. The osseous structures are unremarkable. IMPRESSION: No active cardiopulmonary disease. 8 mm right lower lobe and mm right upper lobe pulmonary nodule. Recommend further evaluation with a nonemergent CT of the chest. Electronically Signed   By: Kathreen Devoid   On: 07/22/2016 16:56   Ct Abdomen Pelvis W Contrast  Result Date: 07/23/2016 CLINICAL DATA:  Poor historian bright red blood per rectum anemia and rectal mass EXAM: CT ABDOMEN AND PELVIS WITH CONTRAST TECHNIQUE: Multidetector CT  imaging of the abdomen and pelvis was performed using the standard protocol following bolus administration of intravenous contrast. CONTRAST:  184m ISOVUE-300 IOPAMIDOL (ISOVUE-300) INJECTION 61% COMPARISON:  None. FINDINGS: Lower chest: Linear scar or atelectasis within the lingula and left lower lobe posteriorly. Lobulated subpleural 1.4 cm density right lung base is contiguous with a more focal nodular density measuring 8.5 mm. No effusion or consolidation. Borderline heart size. No pericardial effusion. Coronary artery calcifications. Hepatobiliary: No focal hepatic abnormality. Gallbladder not well identified and is presumably surgically absent. No biliary dilatation. Pancreas: Unremarkable. No pancreatic ductal dilatation or surrounding inflammatory changes. Spleen: Normal in size without focal abnormality. Adrenals/Urinary Tract: Adrenal glands are within normal limits. Left kidney unremarkable. Cortical irregularity right kidney could relate to focal scarring. Mildly enlarged right extrarenal pelvis with prominent right ureter. No calcified stones seen along the course  of the right ureter. Bladder is unremarkable. Stomach/Bowel: The stomach is nonenlarged. There is no dilated small bowel. There is diverticular disease of the colon. There is irregular posterior wall thickening of the rectum, this is contiguous with a partially visualized soft tissue mass which measures 5.1 by 5.7 cm. Vascular/Lymphatic: Atherosclerosis of the aorta. No enlarged retroperitoneal nodes. There are multiple abnormally enlarged inguinal lymph nodes, on the right this measures 3.7 by 2.3 cm and on the left, this measures 4.3 by 2.2 cm. Reproductive: Status post hysterectomy. No adnexal masses. Other: No free air or free fluid. Musculoskeletal: Degenerative changes of the spine. No suspicious bone lesions. IMPRESSION: 1. Irregular posterior rectal wall thickening which is contiguous with a partially visualized posterior rectal/anal soft tissue mass measuring at least 5.7 cm. 2. Multiple abnormally enlarged bilateral inguinal lymph nodes as described above. Metastatic disease cannot be excluded. 3. Colon diverticular disease without acute inflammation 4. Probable scarring involving right kidney. Enlarged right extrarenal pelvis and prominent right ureter without calcified stone along the course of the right ureter. 5. Right lower lobe nodular density measuring 8.5 mm. Consider dedicated chest CT for further evaluation. Electronically Signed   By: KDonavan FoilM.D.   On: 07/23/2016 16:05     Scheduled Meds: . atorvastatin  20 mg Oral Daily  . ferrous sulfate  325 mg Oral TID WC  . fluticasone  1 spray Each Nare Daily  . folic acid  1 mg Oral Daily  . hydrocortisone cream   Topical BID  . ipratropium-albuterol  3 mL Nebulization BID  . montelukast  10 mg Oral Daily  . pantoprazole  40 mg Oral Daily  . PARoxetine  20 mg Oral Daily  . polyethylene glycol  17 g Oral Daily  . senna-docusate  1 tablet Oral BID  . vitamin B-12  1,000 mcg Oral Daily   Continuous Infusions: . heparin 1,450  Units/hr (07/24/16 0954)     LOS: 1 day    EChipper Oman MD [facility] Pager [phone] If 7PM-7AM, please contact night-coverage [email or web address] Password [email or web address]12/13/2017, 3:19 PM

## 2016-07-24 NOTE — Progress Notes (Signed)
ANTICOAGULATION CONSULT NOTE - Follow Up Consult  Pharmacy Consult for Heparin Indication: Hx of PE and afib.  Allergies  Allergen Reactions  . Aleve [Naproxen Sodium] Hives  . Antihistamines, Chlorpheniramine-Type     ANTIHISTAMINES  . Aspirin   . Ciprofloxacin   . Codeine   . Penicillins     Has patient had a PCN reaction causing immediate rash, facial/tongue/throat swelling, SOB or lightheadedness with hypotension: Yes Has patient had a PCN reaction causing severe rash involving mucus membranes or skin necrosis: No Has patient had a PCN reaction that required hospitalization No Has patient had a PCN reaction occurring within the last 10 years: No If all of the above answers are "NO", then may proceed with Cephalosporin use.   . Rocephin [Ceftriaxone Sodium In Dextrose]   . Sulfa Antibiotics   . Hydrochlorothiazide Rash   Patient Measurements: Height: '5\' 4"'$  (162.6 cm) Weight: 252 lb 11.2 oz (114.6 kg) IBW/kg (Calculated) : 54.7 Heparin Dosing Weight: 82.5 kg  Vital Signs: Temp: 98.7 F (37.1 C) (12/13 0530) Temp Source: Oral (12/13 0530) BP: 125/67 (12/13 0530) Pulse Rate: 109 (12/13 0530)  Labs:  Recent Labs  07/22/16 1529  07/22/16 2028 07/23/16 0035 07/23/16 0653 07/23/16 1621 07/24/16 0248  HGB 9.6*  --  8.8*  --  9.1*  --  9.3*  HCT 31.9*  --  29.9*  --  30.8*  --  31.3*  PLT 254  --  217  --  199  --  226  APTT  --   < > 29  --  55* 65* 36  LABPROT  --   --  16.2*  --   --   --   --   INR  --   --  1.30  --   --   --   --   HEPARINUNFRC  --   < > >2.20*  --  1.24* 1.02* 0.51  CREATININE 1.05*  --   --   --  0.93  --  0.85  TROPONINI  --   --  0.08* 0.09* 0.10*  --   --   < > = values in this interval not displayed.  Estimated Creatinine Clearance: 62.3 mL/min (by C-G formula based on SCr of 0.85 mg/dL).  Medical History: Past Medical History:  Diagnosis Date  . Anxiety   . Atrial fibrillation ([place])   . Chronic bronchitis ([place])   . Chronic  depression   . Diverticular disease   . Fibrocystic disease of breast   . Fracture of shoulder   . GERD (gastroesophageal reflux disease)   . Herpes zoster   . Hyperlipidemia   . Hypertension   . Labyrinthitis   . Lung cancer ([place])   . Obesity   . Paroxysmal supraventricular tachycardia ([place])   . Pulmonary embolus ([place])   . Vitamin D deficiency    Medications: r/u med rec  Assessment: 82 y/o F presents with elevated troponin and rectal mass suspicious for cancer. Eliquis PTA for afib and history of PE. Last dose 12/11. Eliquis on hold for procedure (12/14) and pharmacy is dosing heparin. Per notes patient has been experiencing incontinence with mixture of semi-solid blood and stool for several months. No bleeding noted from other sites. CBC stable with HgB low 9.3. Will avoid bolus and monitor closely.   Heparin level remains falsely elevated this morning from apixaban- will continue to use aPTT for heparin dosing. aPTT dropped overnight from 65 to 36. RN reports to infusion pauses  or IV site issues.   Goal of Therapy:  Heparin level 0.3-0.7 units/ml  aptt 66-102 Monitor platelets by anticoagulation protocol: Yes   Plan:  Increase heparin to 1450 units/hr  Check heparin level, aPTT, and CBC daily  Georga Bora, PharmD Clinical Pharmacist Pager: [phone] 07/24/2016 8:44 AM

## 2016-07-24 NOTE — Progress Notes (Signed)
[facility] Progress Note     Subjective: Complaints of intermittent mild nausea. No vomiting or abdominal pain. No new complaints. Resting in bed.   Objective: Vital signs in last 24 hours: Temp:  [98.2 F (36.8 C)-98.7 F (37.1 C)] 98.7 F (37.1 C) (12/13 0530) Pulse Rate:  [93-121] 109 (12/13 0530) Resp:  [18-20] 20 (12/13 0530) BP: (125-137)/(57-79) 125/67 (12/13 0530) SpO2:  [87 %-99 %] 99 % (12/13 0530) Weight:  [252 lb 11.2 oz (114.6 kg)] 252 lb 11.2 oz (114.6 kg) (12/13 0530) Last BM Date: 07/23/16  Intake/Output from previous day: 12/12 0701 - 12/13 0700 In: 2353 [P.O.:1240; I.V.:155] Out: -  Intake/Output this shift: No intake/output data recorded.  PE: Gen:  Alert, NAD, pleasant, cooperative, obese elderly woman, resting in bed Card:  RRR, no M/G/R heard Pulm:  effort normal Abd: Soft, obese abdomin, NT/ND, +BS  Lab Results:   Recent Labs  07/23/16 0653 07/24/16 0248  WBC 7.1 7.2  HGB 9.1* 9.3*  HCT 30.8* 31.3*  PLT 199 226   BMET  Recent Labs  07/23/16 0653 07/24/16 0248  NA 141 141  K 4.0 4.0  CL 103 104  CO2 31 24  GLUCOSE 101* 105*  BUN 9 7  CREATININE 0.93 0.85  CALCIUM 9.0 8.6*   PT/INR  Recent Labs  07/22/16 2028  LABPROT 16.2*  INR 1.30   CMP     Component Value Date/Time   NA 141 07/24/2016 0248   K 4.0 07/24/2016 0248   CL 104 07/24/2016 0248   CO2 24 07/24/2016 0248   GLUCOSE 105 (H) 07/24/2016 0248   BUN 7 07/24/2016 0248   CREATININE 0.85 07/24/2016 0248   CALCIUM 8.6 (L) 07/24/2016 0248   PROT 5.3 (L) 07/24/2016 0248   ALBUMIN 2.8 (L) 07/24/2016 0248   AST 20 07/24/2016 0248   ALT 12 (L) 07/24/2016 0248   ALKPHOS 42 07/24/2016 0248   BILITOT 0.1 (L) 07/24/2016 0248   GFRNONAA >60 07/24/2016 0248   GFRAA >60 07/24/2016 0248   Lipase  No results found for: LIPASE     Studies/Results: Dg Chest 2 View  Result Date: 07/22/2016 CLINICAL DATA:  Shortness of breath and anemia EXAM: CHEST  2  VIEW COMPARISON:  None. FINDINGS: There is mild bilateral interstitial thickening likely chronic. Mild area of linear airspace disease in the right mid lung likely reflecting scarring. 8 mm right lower lobe pulmonary nodule. 7 mm right upper lobe pulmonary nodule. There is no pleural effusion or pneumothorax. The heart and mediastinal contours are unremarkable. The osseous structures are unremarkable. IMPRESSION: No active cardiopulmonary disease. 8 mm right lower lobe and mm right upper lobe pulmonary nodule. Recommend further evaluation with a nonemergent CT of the chest. Electronically Signed   By: Kathreen Devoid   On: 07/22/2016 16:56   Ct Abdomen Pelvis W Contrast  Result Date: 07/23/2016 CLINICAL DATA:  Poor historian bright red blood per rectum anemia and rectal mass EXAM: CT ABDOMEN AND PELVIS WITH CONTRAST TECHNIQUE: Multidetector CT imaging of the abdomen and pelvis was performed using the standard protocol following bolus administration of intravenous contrast. CONTRAST:  17m ISOVUE-300 IOPAMIDOL (ISOVUE-300) INJECTION 61% COMPARISON:  None. FINDINGS: Lower chest: Linear scar or atelectasis within the lingula and left lower lobe posteriorly. Lobulated subpleural 1.4 cm density right lung base is contiguous with a more focal nodular density measuring 8.5 mm. No effusion or consolidation. Borderline heart size. No pericardial effusion. Coronary artery calcifications. Hepatobiliary: No focal hepatic abnormality.  Gallbladder not well identified and is presumably surgically absent. No biliary dilatation. Pancreas: Unremarkable. No pancreatic ductal dilatation or surrounding inflammatory changes. Spleen: Normal in size without focal abnormality. Adrenals/Urinary Tract: Adrenal glands are within normal limits. Left kidney unremarkable. Cortical irregularity right kidney could relate to focal scarring. Mildly enlarged right extrarenal pelvis with prominent right ureter. No calcified stones seen along the  course of the right ureter. Bladder is unremarkable. Stomach/Bowel: The stomach is nonenlarged. There is no dilated small bowel. There is diverticular disease of the colon. There is irregular posterior wall thickening of the rectum, this is contiguous with a partially visualized soft tissue mass which measures 5.1 by 5.7 cm. Vascular/Lymphatic: Atherosclerosis of the aorta. No enlarged retroperitoneal nodes. There are multiple abnormally enlarged inguinal lymph nodes, on the right this measures 3.7 by 2.3 cm and on the left, this measures 4.3 by 2.2 cm. Reproductive: Status post hysterectomy. No adnexal masses. Other: No free air or free fluid. Musculoskeletal: Degenerative changes of the spine. No suspicious bone lesions. IMPRESSION: 1. Irregular posterior rectal wall thickening which is contiguous with a partially visualized posterior rectal/anal soft tissue mass measuring at least 5.7 cm. 2. Multiple abnormally enlarged bilateral inguinal lymph nodes as described above. Metastatic disease cannot be excluded. 3. Colon diverticular disease without acute inflammation 4. Probable scarring involving right kidney. Enlarged right extrarenal pelvis and prominent right ureter without calcified stone along the course of the right ureter. 5. Right lower lobe nodular density measuring 8.5 mm. Consider dedicated chest CT for further evaluation. Electronically Signed   By: Donavan Foil M.D.   On: 07/23/2016 16:05    Anti-infectives: Anti-infectives    None       Assessment/Plan  Rectal pain and bleeding/Possible rectal mass vs hemorrhoids- Exam under anesthesia tomorrow. CT scan showed rectal mass and enlarged inguinal lymph nodes. H&H stable  Atrial fibrillation/PSVT on Eliquis- transitioned to hep gtt in anticipation of procedure, last dose of Eliquis was 12/11 7AM, Per cards: In the absence of any active anginal symptoms and only some exertional dyspnea which could easily be attributed to her deconditioning,  I would not pursue any additional cardiac evaluation besides the echocardiogram was just done showing normal EF.  The regional wall motion abnormality is at all take any further ischemic evaluation would be recommended as it would only serve to delay her surgery. Hx of PE - on heparin Hypertension Hx of lung cancer/Bronchitits  VTE: SCD's, Heparin FEN: clears, NPO at midnight  Plan: Will take pt to the OR tomorrow to further evaluate her rectal mass. NPO after midnight.    LOS: 1 day    Kalman Drape , [facility] 07/24/2016, 7:24 AM Pager: [phone] Consults: [phone] Mon-Fri 7:00 am-4:30 pm Sat-Sun 7:00 am-11:30 am

## 2016-07-24 NOTE — Progress Notes (Signed)
PT Cancellation Note  Patient Details Name: Tricia Hernandez MRN: [id number] DOB: 08/11/1931   Cancelled Treatment:    Reason Eval/Treat Not Completed: Other (comment).  Pt reports immediate bowel and bladder incontinence with movement.  She would like to wait until after her bx tomorrow and after her family can get her adult incontinence underwear and pads up here from home so that she will not "leak" when getting up and moving around.  PT will check back tomorrow after bx. Thanks,    Barbarann Ehlers. Ellington Greenslade, PT, DPT [phone]   07/24/2016, 5:15 PM

## 2016-07-25 ENCOUNTER — Inpatient Hospital Stay (HOSPITAL_COMMUNITY): Payer: Medicare Other | Admitting: Anesthesiology

## 2016-07-25 ENCOUNTER — Inpatient Hospital Stay (HOSPITAL_COMMUNITY): Payer: Medicare Other

## 2016-07-25 ENCOUNTER — Encounter (HOSPITAL_COMMUNITY): Payer: Self-pay | Admitting: Certified Registered"

## 2016-07-25 ENCOUNTER — Encounter (HOSPITAL_COMMUNITY)
Admission: EM | Disposition: A | Discharge status: Skilled Nursing Facility | Payer: Self-pay | Source: Home / Self Care | Attending: Family Medicine

## 2016-07-25 HISTORY — PX: EVALUATION UNDER ANESTHESIA WITH HEMORRHOIDECTOMY AND PROCTOSCOPY: SHX5625

## 2016-07-25 LAB — APTT: aPTT: 102 seconds — ABNORMAL HIGH (ref 24–36)

## 2016-07-25 LAB — CBC WITH DIFFERENTIAL/PLATELET
BASOS PCT: 0 %
Basophils Absolute: 0 10*3/uL (ref 0.0–0.1)
EOS PCT: 2 %
Eosinophils Absolute: 0.1 10*3/uL (ref 0.0–0.7)
HCT: 31.7 % — ABNORMAL LOW (ref 36.0–46.0)
Hemoglobin: 9.3 g/dL — ABNORMAL LOW (ref 12.0–15.0)
LYMPHS PCT: 18 %
Lymphs Abs: 1.4 10*3/uL (ref 0.7–4.0)
MCH: 25.5 pg — ABNORMAL LOW (ref 26.0–34.0)
MCHC: 29.3 g/dL — ABNORMAL LOW (ref 30.0–36.0)
MCV: 86.8 fL (ref 78.0–100.0)
Monocytes Absolute: 0.8 10*3/uL (ref 0.1–1.0)
Monocytes Relative: 11 %
NEUTROS ABS: 5.6 10*3/uL (ref 1.7–7.7)
Neutrophils Relative %: 69 %
PLATELETS: 205 10*3/uL (ref 150–400)
RBC: 3.65 MIL/uL — AB (ref 3.87–5.11)
RDW: 14.9 % (ref 11.5–15.5)
WBC: 7.9 10*3/uL (ref 4.0–10.5)

## 2016-07-25 LAB — HEPARIN LEVEL (UNFRACTIONATED): HEPARIN UNFRACTIONATED: 0.85 [IU]/mL — AB (ref 0.30–0.70)

## 2016-07-25 SURGERY — EXAM UNDER ANESTHESIA WITH HEMORRHOIDECTOMY AND PROCTOSCOPY
Anesthesia: Monitor Anesthesia Care | Site: Rectum

## 2016-07-25 MED ORDER — METOPROLOL TARTRATE 5 MG/5ML IV SOLN
5.0000 mg | Freq: Four times a day (QID) | INTRAVENOUS | Status: DC
  Administered 2016-07-25 (×2): 5 mg via INTRAVENOUS
  Filled 2016-07-25 (×2): qty 5

## 2016-07-25 MED ORDER — PROPOFOL 10 MG/ML IV BOLUS
INTRAVENOUS | Status: DC | PRN
Start: 2016-07-25 — End: 2016-07-25
  Administered 2016-07-25: 120 mg via INTRAVENOUS

## 2016-07-25 MED ORDER — ONDANSETRON HCL 4 MG/2ML IJ SOLN
INTRAMUSCULAR | Status: DC | PRN
  Administered 2016-07-25: 4 mg via INTRAVENOUS

## 2016-07-25 MED ORDER — ESMOLOL HCL 100 MG/10ML IV SOLN
INTRAVENOUS | Status: DC | PRN
  Administered 2016-07-25: 30 mg via INTRAVENOUS
  Administered 2016-07-25: 40 mg via INTRAVENOUS

## 2016-07-25 MED ORDER — SUGAMMADEX SODIUM 200 MG/2ML IV SOLN
INTRAVENOUS | Status: DC | PRN
  Administered 2016-07-25: 300 mg via INTRAVENOUS

## 2016-07-25 MED ORDER — METOPROLOL TARTRATE 100 MG PO TABS
100.0000 mg | ORAL_TABLET | Freq: Two times a day (BID) | ORAL | Status: DC
  Administered 2016-07-25 – 2016-08-02 (×16): 100 mg via ORAL
  Filled 2016-07-25 (×15): qty 1

## 2016-07-25 MED ORDER — ROCURONIUM BROMIDE 10 MG/ML (PF) SYRINGE
PREFILLED_SYRINGE | INTRAVENOUS | Status: DC | PRN
  Administered 2016-07-25: 50 mg via INTRAVENOUS

## 2016-07-25 MED ORDER — 0.9 % SODIUM CHLORIDE (POUR BTL) OPTIME
TOPICAL | Status: DC | PRN
  Administered 2016-07-25: 1000 mL

## 2016-07-25 MED ORDER — BUPIVACAINE-EPINEPHRINE (PF) 0.25% -1:200000 IJ SOLN
INTRAMUSCULAR | Status: AC
  Filled 2016-07-25: qty 30

## 2016-07-25 MED ORDER — HEMOSTATIC AGENTS (NO CHARGE) OPTIME
TOPICAL | Status: DC | PRN
  Administered 2016-07-25 (×3): 1 via TOPICAL

## 2016-07-25 MED ORDER — FENTANYL CITRATE (PF) 100 MCG/2ML IJ SOLN
INTRAMUSCULAR | Status: DC | PRN
  Administered 2016-07-25: 50 ug via INTRAVENOUS

## 2016-07-25 MED ORDER — PHENYLEPHRINE HCL 10 MG/ML IJ SOLN
INTRAMUSCULAR | Status: DC | PRN
  Administered 2016-07-25: 80 ug via INTRAVENOUS

## 2016-07-25 MED ORDER — FENTANYL CITRATE (PF) 100 MCG/2ML IJ SOLN
INTRAMUSCULAR | Status: AC
  Filled 2016-07-25: qty 2

## 2016-07-25 MED ORDER — BUPIVACAINE LIPOSOME 1.3 % IJ SUSP
20.0000 mL | INTRAMUSCULAR | Status: AC
  Administered 2016-07-25: 17 mL
  Filled 2016-07-25: qty 20

## 2016-07-25 MED ORDER — ROCURONIUM BROMIDE 50 MG/5ML IV SOSY
PREFILLED_SYRINGE | INTRAVENOUS | Status: AC
  Filled 2016-07-25: qty 5

## 2016-07-25 MED ORDER — LIDOCAINE 2% (20 MG/ML) 5 ML SYRINGE
INTRAMUSCULAR | Status: AC
  Filled 2016-07-25: qty 5

## 2016-07-25 MED ORDER — LIDOCAINE 2% (20 MG/ML) 5 ML SYRINGE
INTRAMUSCULAR | Status: DC | PRN
  Administered 2016-07-25: 100 mg via INTRAVENOUS

## 2016-07-25 MED ORDER — DIBUCAINE 1 % RE OINT
TOPICAL_OINTMENT | RECTAL | Status: DC | PRN
  Administered 2016-07-25: 1 via RECTAL

## 2016-07-25 MED ORDER — METOPROLOL TARTRATE 5 MG/5ML IV SOLN
INTRAVENOUS | Status: DC | PRN
  Administered 2016-07-25: 3 mg via INTRAVENOUS
  Administered 2016-07-25: 2 mg via INTRAVENOUS

## 2016-07-25 MED ORDER — DIBUCAINE 1 % RE OINT
TOPICAL_OINTMENT | RECTAL | Status: AC
  Filled 2016-07-25: qty 28

## 2016-07-25 MED ORDER — LACTATED RINGERS IV SOLN
INTRAVENOUS | Status: DC | PRN
  Administered 2016-07-25: 13:00:00 via INTRAVENOUS

## 2016-07-25 MED ORDER — PROPOFOL 10 MG/ML IV BOLUS
INTRAVENOUS | Status: AC
  Filled 2016-07-25: qty 20

## 2016-07-25 SURGICAL SUPPLY — 44 items
CANISTER SUCTION 2500CC (MISCELLANEOUS) ×3 IMPLANT
CLEANER TIP ELECTROSURG 2X2 (MISCELLANEOUS) ×3 IMPLANT
CONT SPEC 4OZ CLIKSEAL STRL BL (MISCELLANEOUS) ×3 IMPLANT
COVER SURGICAL LIGHT HANDLE (MISCELLANEOUS) ×3 IMPLANT
DRAPE UTILITY XL STRL (DRAPES) IMPLANT
DRSG PAD ABDOMINAL 8X10 ST (GAUZE/BANDAGES/DRESSINGS) ×3 IMPLANT
DRSG TELFA 3X8 NADH (GAUZE/BANDAGES/DRESSINGS) ×3 IMPLANT
ELECT REM PT RETURN 9FT ADLT (ELECTROSURGICAL) ×3
ELECTRODE REM PT RTRN 9FT ADLT (ELECTROSURGICAL) ×1 IMPLANT
GAUZE SPONGE 4X4 12PLY STRL (GAUZE/BANDAGES/DRESSINGS) ×6 IMPLANT
GAUZE SPONGE 4X4 16PLY XRAY LF (GAUZE/BANDAGES/DRESSINGS) IMPLANT
GLOVE BIO SURGEON STRL SZ7 (GLOVE) ×3 IMPLANT
GLOVE BIOGEL PI IND STRL 7.0 (GLOVE) ×2 IMPLANT
GLOVE BIOGEL PI IND STRL 8 (GLOVE) ×1 IMPLANT
GLOVE BIOGEL PI INDICATOR 7.0 (GLOVE) ×4
GLOVE BIOGEL PI INDICATOR 8 (GLOVE) ×2
GLOVE ECLIPSE 7.5 STRL STRAW (GLOVE) ×3 IMPLANT
GOWN STRL REUS W/ TWL LRG LVL3 (GOWN DISPOSABLE) ×3 IMPLANT
GOWN STRL REUS W/TWL LRG LVL3 (GOWN DISPOSABLE) ×6
KIT BASIN OR (CUSTOM PROCEDURE TRAY) ×3 IMPLANT
KIT ROOM TURNOVER OR (KITS) ×3 IMPLANT
NEEDLE 18GX1X1/2 (RX/OR ONLY) (NEEDLE) ×3 IMPLANT
NEEDLE BIOPSY 14X6 SOFT TISS (NEEDLE) ×3 IMPLANT
NS IRRIG 1000ML POUR BTL (IV SOLUTION) ×3 IMPLANT
PACK GENERAL/GYN (CUSTOM PROCEDURE TRAY) ×3 IMPLANT
PACK LITHOTOMY IV (CUSTOM PROCEDURE TRAY) IMPLANT
PAD ARMBOARD 7.5X6 YLW CONV (MISCELLANEOUS) ×9 IMPLANT
PENCIL BUTTON HOLSTER BLD 10FT (ELECTRODE) IMPLANT
SEALANT PATCH FIBRIN 2X4IN (MISCELLANEOUS) ×6 IMPLANT
SPECIMEN JAR SMALL (MISCELLANEOUS) ×3 IMPLANT
SPONGE HEMORRHOID 8X3CM (HEMOSTASIS) ×3 IMPLANT
SPONGE SURGIFOAM ABS GEL 100 (HEMOSTASIS) IMPLANT
SURGILUBE 2OZ TUBE FLIPTOP (MISCELLANEOUS) ×3 IMPLANT
SUT CHROMIC 3 0 SH 27 (SUTURE) IMPLANT
SYR 20CC LL (SYRINGE) ×3 IMPLANT
SYR CONTROL 10ML LL (SYRINGE) ×3 IMPLANT
TOWEL OR 17X24 6PK STRL BLUE (TOWEL DISPOSABLE) IMPLANT
TOWEL OR 17X26 10 PK STRL BLUE (TOWEL DISPOSABLE) ×3 IMPLANT
TRAY PROCTOSCOPIC FIBER OPTIC (SET/KITS/TRAYS/PACK) ×3 IMPLANT
TUBE CONNECTING 12'X1/4 (SUCTIONS)
TUBE CONNECTING 12X1/4 (SUCTIONS) IMPLANT
UNDERPAD 30X30 (UNDERPADS AND DIAPERS) ×3 IMPLANT
WATER STERILE IRR 1000ML POUR (IV SOLUTION) IMPLANT
YANKAUER SUCT BULB TIP NO VENT (SUCTIONS) IMPLANT

## 2016-07-25 NOTE — Anesthesia Postprocedure Evaluation (Signed)
Anesthesia Post Note  Patient: Tricia Hernandez  Procedure(s) Performed: Procedure(s) (LRB): EXAM UNDER ANESTHESIA, EXCISION PERIANAL MASS. (N/A)  Patient location during evaluation: PACU Anesthesia Type: General Level of consciousness: sedated Pain management: satisfactory to patient Vital Signs Assessment: post-procedure vital signs reviewed and stable Respiratory status: spontaneous breathing Cardiovascular status: stable Anesthetic complications: no Comments: HR at pre-op levels    Last Vitals:  Vitals:   07/25/16 1445 07/25/16 1500  BP: (!) 152/99 (!) 152/90  Pulse: (!) 105 (!) 106  Resp: (!) 22 (!) 22  Temp: 36.7 C     Last Pain:  Vitals:   07/25/16 1500  TempSrc:   PainSc: 0-No pain                 Kaoru Rezendes EDWARD

## 2016-07-25 NOTE — Progress Notes (Signed)
PROGRESS NOTE [facility]   Ghazal Pevey   LAG:[id number] DOB: 1930-12-23  DOA: 07/22/2016 PCP: Clinton Quant, MD   Brief Narrative:  82 year old female with past medical history of hypertension, hyperlipidemia, PSVT, PE, PAF on Eliquis presented to the emergency room with complaining of shortness of breath and fatigue and the BRBPR with rectal mass. Currently undergoing for rectal mass workup. Surgery planning for biopsy in the morning.  Subjective: Patient seen and examined with multiple family members at bedside. Patient is doing well, some nausea in AM resolved with zofran. Plan was for surgery in Am, but heparin was not stopped on adequate time. Postponed for 12:30 pm today. Family members inquiring about lung nodule, they want repeat chest CT while patient is in the hospital.   Assessment & Plan: Bright red blood per rectum and rectal mass - denies pain or tenderness Symptomatic anemia - H&H stable Patient evaluated by general surgery which recommended rectal mass biopsy. Plan for tomorrow in the morning keep patient nothing by mouth after midnight. Continue anusol suppository and sitz baths necessary Continue MiraLAX Transfuse for hemoglobin less than 7. For surgery today   Hypertension - stable  History of CHF Cardiology consulted for clearance for surgical procedure.  Echo showed EF of 60-65% no wall motion abnormality. Elevated troponin likely secondary to uncontrolled Afib vs demand ischemia  No sings of fluid overload  BP stable   History of PE. A. Fib. - rate not well controlled  CHA2DS2-VASc Score 5 Currently holding Apixaban and transitioned to heparin ggt in view of surgery BB on hold in view of surgical procedure  If HR remains elevated > 150 consider a dose of Cardizem or BB  prior to surgery  Resume BB post op PO in remains NPO will give IV BB  Dyspnea on exertion likely due to uncontrolled afib - placed on O2 due to desating while ambulating.  Wean as tolerated.   Pulmonary nodule. History of lung cancer. 13m nodule in the right lower lobe and 7 mm right upper lobe pulmonary nodule. Patient does have history of right sided lung cancer S/P surgical resection in the past 2013. Will repeat CT chest without contrast   DVT prophylaxis: Eliquis Code Status: Full Family Communication: Husband at bedside Disposition Plan: Home when medically stable.  Consultants:   Cardiology   Gen Surgery   Procedures:   ECHO  ------------------------------------------------------------------- Study Conclusions  - Left ventricle: The cavity size was normal. Wall thickness was   increased in a pattern of moderate LVH. Systolic function was   normal. The estimated ejection fraction was in the range of 60%   to 65%. Wall motion was normal; there were no regional wall   motion abnormalities.  Antimicrobials:  None    Objective: Vitals:   07/24/16 2035 07/24/16 2148 07/25/16 0553 07/25/16 0839  BP: 130/70  (!) 144/78   Pulse: (!) 108  (!) 120 (!) 125  Resp: 20  20   Temp: 98.2 F (36.8 C)  98.3 F (36.8 C)   TempSrc: Oral  Oral   SpO2: 98% 98% 98% 95%  Weight:   112.9 kg (249 lb)   Height:        Intake/Output Summary (Last 24 hours) at 07/25/16 0950 Last data filed at 07/24/16 1300  Gross per 24 hour  Intake              480 ml  Output  0 ml  Net              480 ml   Filed Weights   07/23/16 0532 07/24/16 0530 07/25/16 0553  Weight: 115.5 kg (254 lb 9.6 oz) 114.6 kg (252 lb 11.2 oz) 112.9 kg (249 lb)    Examination:  General exam: Appears calm and comfortable. Hard hearing  Respiratory system: Clear to auscultation. No wheezes,crackle or rhonchi Cardiovascular system: S1 & S2 heard, RRR. No JVD, murmurs, rubs or gallops Gastrointestinal system: Nondistended, soft and nontender. Normal bowel sounds heard. Central nervous system: Alert and oriented.  Extremities: No pedal edema   Data Reviewed: I  have personally reviewed following labs and imaging studies  CBC:  Recent Labs Lab 07/22/16 1147 07/22/16 1529 07/22/16 2028 07/23/16 0653 07/24/16 0248 07/25/16 0153  WBC 8.9 7.9 7.2 7.1 7.2 7.9  NEUTROABS 6.8  --  4.8 4.7 5.3 5.6  HGB 9.9* 9.6* 8.8* 9.1* 9.3* 9.3*  HCT 30.8* 31.9* 29.9* 30.8* 31.3* 31.7*  MCV 82.9 86.9 87.2 88.0 86.0 86.8  PLT 257.0 254 217 199 226 867   Basic Metabolic Panel:  Recent Labs Lab 07/22/16 1147 07/22/16 1529 07/23/16 0653 07/24/16 0248  NA 140 142 141 141  K 4.3 4.0 4.0 4.0  CL 102 103 103 104  CO2 32 33* 31 24  GLUCOSE 139* 124* 101* 105*  BUN '12 11 9 7  '$ CREATININE 1.07 1.05* 0.93 0.85  CALCIUM 9.5 9.4 9.0 8.6*  MG  --   --  1.8  --    GFR: Estimated Creatinine Clearance: 61.8 mL/min (by C-G formula based on SCr of 0.85 mg/dL). Liver Function Tests:  Recent Labs Lab 07/22/16 1147 07/23/16 0653 07/24/16 0248  AST '18 20 20  '$ ALT 10 12* 12*  ALKPHOS 41 37* 42  BILITOT 0.3 0.3 0.1*  PROT 6.2 5.4* 5.3*  ALBUMIN 3.5 2.9* 2.8*   No results for input(s): LIPASE, AMYLASE in the last 168 hours. No results for input(s): AMMONIA in the last 168 hours. Coagulation Profile:  Recent Labs Lab 07/22/16 2028  INR 1.30   Cardiac Enzymes:  Recent Labs Lab 07/22/16 2028 07/23/16 0035 07/23/16 0653  TROPONINI 0.08* 0.09* 0.10*   BNP (last 3 results) No results for input(s): PROBNP in the last 8760 hours. HbA1C: No results for input(s): HGBA1C in the last 72 hours. CBG: No results for input(s): GLUCAP in the last 168 hours. Lipid Profile: No results for input(s): CHOL, HDL, LDLCALC, TRIG, CHOLHDL, LDLDIRECT in the last 72 hours. Thyroid Function Tests: No results for input(s): TSH, T4TOTAL, FREET4, T3FREE, THYROIDAB in the last 72 hours. Anemia Panel:  Recent Labs  07/22/16 1147  FERRITIN 17*  TIBC 459*  IRON 27*  26*   Sepsis Labs: No results for input(s): PROCALCITON, LATICACIDVEN in the last 168 hours.  No  results found for this or any previous visit (from the past 240 hour(s)).     Radiology Studies: Ct Abdomen Pelvis W Contrast  Result Date: 07/23/2016 CLINICAL DATA:  Poor historian bright red blood per rectum anemia and rectal mass EXAM: CT ABDOMEN AND PELVIS WITH CONTRAST TECHNIQUE: Multidetector CT imaging of the abdomen and pelvis was performed using the standard protocol following bolus administration of intravenous contrast. CONTRAST:  168m ISOVUE-300 IOPAMIDOL (ISOVUE-300) INJECTION 61% COMPARISON:  None. FINDINGS: Lower chest: Linear scar or atelectasis within the lingula and left lower lobe posteriorly. Lobulated subpleural 1.4 cm density right lung base is contiguous with a more focal nodular density measuring  8.5 mm. No effusion or consolidation. Borderline heart size. No pericardial effusion. Coronary artery calcifications. Hepatobiliary: No focal hepatic abnormality. Gallbladder not well identified and is presumably surgically absent. No biliary dilatation. Pancreas: Unremarkable. No pancreatic ductal dilatation or surrounding inflammatory changes. Spleen: Normal in size without focal abnormality. Adrenals/Urinary Tract: Adrenal glands are within normal limits. Left kidney unremarkable. Cortical irregularity right kidney could relate to focal scarring. Mildly enlarged right extrarenal pelvis with prominent right ureter. No calcified stones seen along the course of the right ureter. Bladder is unremarkable. Stomach/Bowel: The stomach is nonenlarged. There is no dilated small bowel. There is diverticular disease of the colon. There is irregular posterior wall thickening of the rectum, this is contiguous with a partially visualized soft tissue mass which measures 5.1 by 5.7 cm. Vascular/Lymphatic: Atherosclerosis of the aorta. No enlarged retroperitoneal nodes. There are multiple abnormally enlarged inguinal lymph nodes, on the right this measures 3.7 by 2.3 cm and on the left, this measures 4.3  by 2.2 cm. Reproductive: Status post hysterectomy. No adnexal masses. Other: No free air or free fluid. Musculoskeletal: Degenerative changes of the spine. No suspicious bone lesions. IMPRESSION: 1. Irregular posterior rectal wall thickening which is contiguous with a partially visualized posterior rectal/anal soft tissue mass measuring at least 5.7 cm. 2. Multiple abnormally enlarged bilateral inguinal lymph nodes as described above. Metastatic disease cannot be excluded. 3. Colon diverticular disease without acute inflammation 4. Probable scarring involving right kidney. Enlarged right extrarenal pelvis and prominent right ureter without calcified stone along the course of the right ureter. 5. Right lower lobe nodular density measuring 8.5 mm. Consider dedicated chest CT for further evaluation. Electronically Signed   By: Donavan Foil M.D.   On: 07/23/2016 16:05     Scheduled Meds: . atorvastatin  20 mg Oral Daily  . ferrous sulfate  325 mg Oral TID WC  . fluticasone  1 spray Each Nare Daily  . folic acid  1 mg Oral Daily  . hydrocortisone cream   Topical BID  . ipratropium-albuterol  3 mL Nebulization BID  . meropenem (MERREM) IV  1 g Intravenous To SS-Surg  . metoprolol  5 mg Intravenous Q6H  . montelukast  10 mg Oral Daily  . pantoprazole  40 mg Oral Daily  . PARoxetine  20 mg Oral Daily  . polyethylene glycol  17 g Oral Daily  . senna-docusate  1 tablet Oral BID  . vitamin B-12  1,000 mcg Oral Daily   Continuous Infusions:    LOS: 2 days    Chipper Oman, MD [facility] Pager [phone]  If 7PM-7AM, please contact night-coverage [email or web address] Password [email or web address] 07/25/2016, 9:50 AM

## 2016-07-25 NOTE — Progress Notes (Signed)
Attempted report to OR x's 2.

## 2016-07-25 NOTE — Progress Notes (Signed)
Heparin gtt turned off per order, pharm consulted, as no procedure currently scheduled in OR for this patient.  Will attempt to contact MD.

## 2016-07-25 NOTE — Progress Notes (Signed)
Patient Name: Tricia Hernandez Date of Encounter: 07/25/2016  Primary Cardiologist: Dr. Gibson Ramp in [place], [facility] Problem List     Principal Problem:   Rectal mass Active Problems:   SOB (shortness of breath)   Pulmonary embolus (HCC)   Obesity   Lung cancer (HCC)   Hypertension   Symptomatic anemia   BRBPR (bright red blood per rectum)   Elevated troponin   Atrial fibrillation (HCC), CHA2DS2-VASc Score 5   Shortness of breath    Subjective   Feels well, no palpitations or chest pain.   Inpatient Medications    Scheduled Meds: . atorvastatin  20 mg Oral Daily  . ferrous sulfate  325 mg Oral TID WC  . fluticasone  1 spray Each Nare Daily  . folic acid  1 mg Oral Daily  . hydrocortisone cream   Topical BID  . meropenem (MERREM) IV  1 g Intravenous To SS-Surg  . metoprolol  5 mg Intravenous Q6H  . montelukast  10 mg Oral Daily  . pantoprazole  40 mg Oral Daily  . PARoxetine  20 mg Oral Daily  . polyethylene glycol  17 g Oral Daily  . senna-docusate  1 tablet Oral BID  . vitamin B-12  1,000 mcg Oral Daily   Continuous Infusions:  PRN Meds: acetaminophen **OR** acetaminophen, albuterol, bisacodyl, lip balm, LORazepam, ondansetron **OR** ondansetron (ZOFRAN) IV   Vital Signs    Vitals:   07/24/16 2035 07/24/16 2148 07/25/16 0553 07/25/16 0839  BP: 130/70  (!) 144/78   Pulse: (!) 108  (!) 120 (!) 125  Resp: 20  20   Temp: 98.2 F (36.8 C)  98.3 F (36.8 C)   TempSrc: Oral  Oral   SpO2: 98% 98% 98% 95%  Weight:   249 lb (112.9 kg)   Height:        Intake/Output Summary (Last 24 hours) at 07/25/16 1028 Last data filed at 07/24/16 1300  Gross per 24 hour  Intake              240 ml  Output                0 ml  Net              240 ml   Filed Weights   07/23/16 0532 07/24/16 0530 07/25/16 0553  Weight: 254 lb 9.6 oz (115.5 kg) 252 lb 11.2 oz (114.6 kg) 249 lb (112.9 kg)    Physical Exam    General: Pleasant, NAD Psych: Normal affect. Neuro:  Alert and oriented X 3. Moves all extremities spontaneously. HEENT: Normal           Neck: Supple without bruits or JVD. Lungs:  Resp regular and unlabored, CTA. Heart: irregularly irregular rhythm.Distant heart sounds with normal S1 and S2  1/6 SEM at RUSB.Marland Kitchen Abdomen: Soft, non-tender, non-distended, BS + x 4. Obese Extremities: No clubbing, cyanosis, trivial edema. DP/PT/Radials 2+ and equal bilaterally.  Labs    CBC  Recent Labs  07/24/16 0248 07/25/16 0153  WBC 7.2 7.9  NEUTROABS 5.3 5.6  HGB 9.3* 9.3*  HCT 31.3* 31.7*  MCV 86.0 86.8  PLT 226 001   Basic Metabolic Panel  Recent Labs  07/23/16 0653 07/24/16 0248  NA 141 141  K 4.0 4.0  CL 103 104  CO2 31 24  GLUCOSE 101* 105*  BUN 9 7  CREATININE 0.93 0.85  CALCIUM 9.0 8.6*  MG 1.8  --    Liver  Function Tests  Recent Labs  07/23/16 0653 07/24/16 0248  AST 20 20  ALT 12* 12*  ALKPHOS 37* 42  BILITOT 0.3 0.1*  PROT 5.4* 5.3*  ALBUMIN 2.9* 2.8*   Cardiac Enzymes  Recent Labs  07/22/16 2028 07/23/16 0035 07/23/16 0653  TROPONINI 0.08* 0.09* 0.10*     Telemetry    Afib, with rapid rates in the 120's at times - Personally Reviewed    Radiology    Ct Abdomen Pelvis W Contrast  Result Date: 07/23/2016 CLINICAL DATA:  Poor historian bright red blood per rectum anemia and rectal mass EXAM: CT ABDOMEN AND PELVIS WITH CONTRAST TECHNIQUE: Multidetector CT imaging of the abdomen and pelvis was performed using the standard protocol following bolus administration of intravenous contrast. CONTRAST:  178m ISOVUE-300 IOPAMIDOL (ISOVUE-300) INJECTION 61% COMPARISON:  None. FINDINGS: Lower chest: Linear scar or atelectasis within the lingula and left lower lobe posteriorly. Lobulated subpleural 1.4 cm density right lung base is contiguous with a more focal nodular density measuring 8.5 mm. No effusion or consolidation. Borderline heart size. No pericardial effusion. Coronary artery calcifications.  Hepatobiliary: No focal hepatic abnormality. Gallbladder not well identified and is presumably surgically absent. No biliary dilatation. Pancreas: Unremarkable. No pancreatic ductal dilatation or surrounding inflammatory changes. Spleen: Normal in size without focal abnormality. Adrenals/Urinary Tract: Adrenal glands are within normal limits. Left kidney unremarkable. Cortical irregularity right kidney could relate to focal scarring. Mildly enlarged right extrarenal pelvis with prominent right ureter. No calcified stones seen along the course of the right ureter. Bladder is unremarkable. Stomach/Bowel: The stomach is nonenlarged. There is no dilated small bowel. There is diverticular disease of the colon. There is irregular posterior wall thickening of the rectum, this is contiguous with a partially visualized soft tissue mass which measures 5.1 by 5.7 cm. Vascular/Lymphatic: Atherosclerosis of the aorta. No enlarged retroperitoneal nodes. There are multiple abnormally enlarged inguinal lymph nodes, on the right this measures 3.7 by 2.3 cm and on the left, this measures 4.3 by 2.2 cm. Reproductive: Status post hysterectomy. No adnexal masses. Other: No free air or free fluid. Musculoskeletal: Degenerative changes of the spine. No suspicious bone lesions. IMPRESSION: 1. Irregular posterior rectal wall thickening which is contiguous with a partially visualized posterior rectal/anal soft tissue mass measuring at least 5.7 cm. 2. Multiple abnormally enlarged bilateral inguinal lymph nodes as described above. Metastatic disease cannot be excluded. 3. Colon diverticular disease without acute inflammation 4. Probable scarring involving right kidney. Enlarged right extrarenal pelvis and prominent right ureter without calcified stone along the course of the right ureter. 5. Right lower lobe nodular density measuring 8.5 mm. Consider dedicated chest CT for further evaluation. Electronically Signed   By: KDonavan FoilM.D.    On: 07/23/2016 16:05    Cardiac Studies   Transthoracic Echocardiography Study Conclusions  - Left ventricle: The cavity size was normal. Wall thickness was   increased in a pattern of moderate LVH. Systolic function was   normal. The estimated ejection fraction was in the range of 60%   to 65%. Wall motion was normal; there were no regional wall   motion abnormalities. - Aortic valve: Mildly calcified annulus. - Right atrium: The atrium was moderately dilated. - Tricuspid valve: There was mild-moderate regurgitation.  Patient Profile     Ms. WSpindleris a 82year old female with a past medical history of atrial fib (on Eliquis at home), HTN, PSVT, and PE. Cardiology was consulted for pre op clearance.   Assessment &  Plan    1. Permanent atrial fibrillation: With elevated rates today, IV metoprolol ordered.   This patients CHA2DS2-VASc Score and unadjusted Ischemic Stroke Rate (% per year) is equal to 4.8 % stroke rate/year from a score of 4 Above score calculated as 1 point each if present [CHF, HTN, DM, Vascular=MI/PAD/Aortic Plaque, Age if 65-74, or Female], 2 points each if present [Age > 75, or Stroke/TIA/TE]  On IV Heparin gtt in anticipation for surgery. Will transition back to [place] when ok with surgery team.    Signed, Arbutus Leas, NP  07/25/2016, 10:28 AM   Patient was being packaged up to go to the OR when I went to see her. I discussed findings recommendations with Jettie Booze, NP.  She had been on a relatively high dose of oral Lopressor at home and had not been placed on IV medications. I written for IV Lopressor for rate control as she has now had some RVR.    Perioperatively, best rate control agent would be beta blocker. However if she becomes somewhat hypotensive, would then use amiodarone short-term.  Restart her home dose pack when stable postoperatively.   Glenetta Hew, M.D., M.S. Interventional Cardiologist   Pager # [phone] Phone #  [phone] [address] [place], [place] [address]

## 2016-07-25 NOTE — Anesthesia Procedure Notes (Signed)
Procedure Name: Intubation Date/Time: 07/25/2016 1:29 PM Performed by: Myna Bright Pre-anesthesia Checklist: Patient identified, Emergency Drugs available, Suction available and Patient being monitored Patient Re-evaluated:Patient Re-evaluated prior to inductionOxygen Delivery Method: Circle system utilized Preoxygenation: Pre-oxygenation with 100% oxygen Intubation Type: IV induction Ventilation: Mask ventilation without difficulty Laryngoscope Size: Mac and 3 Grade View: Grade I Tube type: Oral Tube size: 7.0 mm Number of attempts: 1 Airway Equipment and Method: Stylet Placement Confirmation: ETT inserted through vocal cords under direct vision,  positive ETCO2 and breath sounds checked- equal and bilateral Secured at: 21 cm Tube secured with: Tape Dental Injury: Teeth and Oropharynx as per pre-operative assessment

## 2016-07-25 NOTE — Progress Notes (Signed)
Surgery planned for this morning.  She is doing okay.  Heparin was stopped but not sure at what time.  It turns out that theheparin was not stopped until 0800.  Will not be able to do surgery  Until 1230.  I have explained this to the family.  [name]. Dahlia Bailiff, MD, [place] [phone] [facility]

## 2016-07-25 NOTE — Op Note (Signed)
OPERATIVE REPORT  DATE OF OPERATION:  07/25/2016  PATIENT:  Tricia Hernandez  82 y.o. female  PRE-OPERATIVE DIAGNOSIS:  RECTAL MASS AND PAIN  POST-OPERATIVE DIAGNOSIS:  ANORECTAL MASS, INVASIVE SQUAMOUS CELL CARCINOMA  INDICATION(S) FOR OPERATION:  Perianal pain and weeks of incontinence, bleeding and protuberant mass  FINDINGS:  5.0cm right sided, firm anal mass with left sided fissure associated with friable mucosa and large left sided subcutaneous mass;  Frozen section confirmed invasive squamous cell cancer  PROCEDURE:  Procedure(s): EXAM UNDER ANESTHESIA, EXCISIONAL BIOPSY OF ANAL MASS, ANOSCOPY, TRU-CUT NEEDLE BIOPSY OF LEFT SIDED SUBCUTANEOUS MASS  SURGEON:  Surgeon(s): Judeth Horn, MD  ASSISTANT: None  ANESTHESIA:   general  COMPLICATIONS:  None  EBL: 100 ml  BLOOD ADMINISTERED: none  DRAINS: none   SPECIMEN:  Source of Specimen:  Large right anal mass for permanent and frozen section.  Tru-cut needle biospy of left perianal subcutaneous mass  COUNTS CORRECT:  YES  PROCEDURE DETAILS: The patient was taken to the operating room and placed on the table initially in the supine position. After an adequate general endotracheal anesthetic was administered, she was flipped on the prone position prepping her perianal area in the usual sterile manner.  A proper timeout was performed identifying the patient and procedure to be performed.  The right perianal mass was protuberant and very firm. Those no evidence of thrombosis or necrosis or pus. Upon digital examination there was a fissure in the area o'clock position with 12:00 being directly posterior. Palpating deeply in the subcutaneous area on that left side those a firm large mass which probably measures 6-7 cm in the subcutaneous plane.  The right anal and perianal mass was excised using electrocautery into a very friable frond-like mass which was subsequently sent for frozen section biopsy. The frozen section demonstrated  an invasive squamous cell carcinoma.  The base of the mass--which had been left open--was cauterize and subsequently a large piece of Evarrest was placed on the wound and held in place for 5 minutes. This completely controlled the bleeding externally. However the patient continued has some bleeding from where a digital exam was performed. An anoscopy was performed using a small scope and only friable bleeding mucosa was noted.  We subsequently packed the anal area internally using a rolled piece of Gelfoam with dibucaine ointment. 17cc of Exparel was injected in the perianal area.  The Evarrest was left in place and a dressing applied to the entire perianal area. All counts were correct including needles, sponges, and instruments.  PATIENT DISPOSITION:  PACU - guarded condition.   Facundo Allemand 12/14/20172:26 PM

## 2016-07-25 NOTE — Evaluation (Signed)
Physical Therapy Evaluation Patient Details Name: Tricia Hernandez MRN: [id number] DOB: Jan 17, 1931 Today's Date: 07/25/2016   History of Present Illness  Tricia Hernandez is a 82 y.o. female admitted with Fatigue, shortness of breath, BRBPR and rectal mass. PMHx: pulmonary embolism 3 years ago, lung cancer S/P resection 2013, HTN, unknown CHF, morbid obesity, dyslipidemia, chronic anticoagulation  Clinical Impression  Patient has bilateral lower extremity weakness, decreased mobility, cardiovascular limitations, and decreased activity tolerance and would benefit from physical therapy to address these deficits. During the treatment, patient presented with afib, noted when patient sat EOB and when standing (see below). Patient was asymptomatic, noting she could not feel any cardiac abnormalities. Due to high heart rates, patient was not able to ambulate. Also, patient desatted on room air, so she was placed back on 2L O2. Patient's nurse is aware of heart rate and O2 during session. Patient is very pleasant and ready to return to PLOF, and has a good prognosis to maximize function due to this. Discharge planning was discussed with family and patient, and her children would like her to return to a SNF. The patient is reluctant to go to SNF during the holidays, and would like to progress in the hospital to be able to receive home health. PT recommendation is SNF as of right now, but with patient progression home health could be appropirate. Will continue to follow.   HR resting: 125 HR EOB: 161 HR standing: 200  HR sitting: 130s, recovering  SPO2 2LO2 during session: 95-96% SPO2 room air resting: 87-88%     Follow Up Recommendations SNF     Equipment Recommendations  None recommended by PT    Recommendations for Other Services       Precautions / Restrictions Precautions Precautions: Fall Precaution Comments: watch sats and HR      Mobility  Bed Mobility Overal bed mobility: Needs  Assistance Bed Mobility: Supine to Sit     Supine to sit: Min guard     General bed mobility comments: min guard for steadying EOB and safety   Transfers Overall transfer level: Needs assistance Equipment used: Rolling walker (2 wheeled) Transfers: Sit to/from [facility] Sit to Stand: Min guard Stand pivot transfers: Min guard       General transfer comment: sit to stand from EOB, with verbal cuing for hand placement. HR rose to 200 with standing, so patient sat back down, recovered HR to 130s. Patient remained asymptomatic. Patient performed stand pivot with RW and min guard for safety.    Ambulation/Gait             General Gait Details: unable to perform ambulation due to HR contraindication.   Stairs            Wheelchair Mobility    Modified Rankin (Stroke Patients Only)       Balance Overall balance assessment: Needs assistance Sitting-balance support: Feet supported Sitting balance-Leahy Scale: Good Sitting balance - Comments: sat EOB independently     Standing balance-Leahy Scale: Fair Standing balance comment: use of RW with standing, and patient not assessed without RW.                              Pertinent Vitals/Pain Pain Assessment: No/denies pain    Home Living Family/patient expects to be discharged to:: Private residence Living Arrangements: Alone Available Help at Discharge: Available PRN/intermittently Type of Home: House Home Access: Stairs to enter Entrance Stairs-Rails: Can  reach both Entrance Stairs-Number of Steps: 4 Home Layout: One level Home Equipment: [place] - 2 wheels;Cane - single point;Shower seat      Prior Function Level of Independence: Independent         Comments: (P) done everything for self      Hand Dominance        Extremity/Trunk Assessment   Upper Extremity Assessment Upper Extremity Assessment: Overall WFL for tasks assessed    Lower Extremity  Assessment Lower Extremity Assessment: RLE deficits/detail;LLE deficits/detail RLE Deficits / Details: 3+/5 hip flexion; 4/5 knee extension and knee flexion  LLE Deficits / Details: 4/5 hip flexion, knee extension and knee flexion        Communication   Communication: HOH  Cognition Arousal/Alertness: Awake/alert Behavior During Therapy: WFL for tasks assessed/performed Overall Cognitive Status: Within Functional Limits for tasks assessed                      General Comments      Exercises     Assessment/Plan    PT Assessment Patient needs continued PT services  PT Problem List Decreased strength;Decreased mobility;Decreased activity tolerance;Decreased coordination;Cardiopulmonary status limiting activity          PT Treatment Interventions DME instruction;Gait training;Stair training;Functional mobility training;Therapeutic activities;Therapeutic exercise;Patient/family education    PT Goals (Current goals can be found in the Care Plan section)  Acute Rehab PT Goals Patient Stated Goal: return to taking care of self  PT Goal Formulation: With patient Time For Goal Achievement: 08/08/16 Potential to Achieve Goals: Good    Frequency Min 3X/week   Barriers to discharge        Co-evaluation               End of Session Equipment Utilized During Treatment: Gait belt;Oxygen Activity Tolerance: Treatment limited secondary to medical complications (Comment) Patient left: in chair;with call bell/phone within reach;with chair alarm set;with family/visitor present Nurse Communication: Mobility status         Time: 2820-6015 PT Time Calculation (min) (ACUTE ONLY): 30 min   Charges:   PT Evaluation $PT Eval Moderate Complexity: 1 Procedure     PT G Codes:        Tricia Hernandez 22-Aug-2016, 8:57 AM Tricia Hernandez SPT [phone]

## 2016-07-25 NOTE — Anesthesia Preprocedure Evaluation (Addendum)
Anesthesia Evaluation  Patient identified by MRN, date of birth, ID band Patient awake    Reviewed: Allergy & Precautions, H&P , Patient's Chart, lab work & pertinent test results, reviewed documented beta blocker date and time   Airway Mallampati: II  TM Distance: >3 FB Neck ROM: full    Dental no notable dental hx.    Pulmonary former smoker,    Pulmonary exam normal breath sounds clear to auscultation       Cardiovascular hypertension,  Rhythm:regular Rate:Normal     Neuro/Psych    GI/Hepatic   Endo/Other    Renal/GU      Musculoskeletal   Abdominal   Peds  Hematology  (+) anemia ,   Anesthesia Other Findings Obesity    Hypertension ; EF 60%   Paroxysmal supraventricular tachycardia  Atrial fibrillation (HCC)   Chronic bronchitis (HCC)    GERD (gastroesophageal reflux disease)   Diverticular disease    Fibrocystic disease of breast   Pulmonary embolus (HCC)         Reproductive/Obstetrics                            Anesthesia Physical Anesthesia Plan  ASA: III  Anesthesia Plan: MAC   Post-op Pain Management:    Induction: Intravenous  Airway Management Planned: Mask and Natural Airway  Additional Equipment:   Intra-op Plan:   Post-operative Plan:   Informed Consent: I have reviewed the patients History and Physical, chart, labs and discussed the procedure including the risks, benefits and alternatives for the proposed anesthesia with the patient or authorized representative who has indicated his/her understanding and acceptance.   Dental Advisory Given  Plan Discussed with: CRNA and Surgeon  Anesthesia Plan Comments: (Discussed sedation and potential to need to place airway or ETT if warranted by clinical changes intra-operatively. We will start procedure as MAC.)        Anesthesia Quick Evaluation

## 2016-07-25 NOTE — Transfer of Care (Signed)
Immediate Anesthesia Transfer of Care Note  Patient: Tricia Hernandez  Procedure(s) Performed: Procedure(s) with comments: EXAM UNDER ANESTHESIA, EXCISION PERIANAL MASS. (N/A) - Prone position  Patient Location: PACU  Anesthesia Type:General  Level of Consciousness: awake, alert , oriented and patient cooperative  Airway & Oxygen Therapy: Patient Spontanous Breathing and Patient connected to nasal cannula oxygen  Post-op Assessment: Report given to RN, Post -op Vital signs reviewed and stable and Patient moving all extremities  Post vital signs: Reviewed and stable  Last Vitals:  Vitals:   07/25/16 0553 07/25/16 0839  BP: (!) 144/78   Pulse: (!) 120 (!) 125  Resp: 20   Temp: 36.8 C     Last Pain:  Vitals:   07/25/16 0820  TempSrc:   PainSc: 0-No pain         Complications: No apparent anesthesia complications

## 2016-07-26 ENCOUNTER — Ambulatory Visit
Admit: 2016-07-26 | Discharge: 2016-07-26 | Disposition: A | Discharge status: Home / Self Care | Payer: Medicare Other | Attending: Radiation Oncology | Admitting: Radiation Oncology

## 2016-07-26 ENCOUNTER — Encounter (HOSPITAL_COMMUNITY): Payer: Self-pay | Admitting: General Surgery

## 2016-07-26 DIAGNOSIS — C210 Malignant neoplasm of anus, unspecified: Secondary | ICD-10-CM

## 2016-07-26 DIAGNOSIS — R911 Solitary pulmonary nodule: Secondary | ICD-10-CM

## 2016-07-26 LAB — BASIC METABOLIC PANEL
ANION GAP: 4 — AB (ref 5–15)
BUN: 5 mg/dL — ABNORMAL LOW (ref 6–20)
CALCIUM: 8.5 mg/dL — AB (ref 8.9–10.3)
CO2: 33 mmol/L — ABNORMAL HIGH (ref 22–32)
Chloride: 101 mmol/L (ref 101–111)
Creatinine, Ser: 0.91 mg/dL (ref 0.44–1.00)
GFR calc Af Amer: >60 mL/min (ref >60)
GFR, EST NON AFRICAN AMERICAN: 57 mL/min — AB (ref >60)
Glucose, Bld: 116 mg/dL — ABNORMAL HIGH (ref 65–99)
POTASSIUM: 4.4 mmol/L (ref 3.5–5.1)
Sodium: 138 mmol/L (ref 135–145)

## 2016-07-26 LAB — CBC WITH DIFFERENTIAL/PLATELET
Basophils Absolute: 0 10*3/uL (ref 0.0–0.1)
Basophils Relative: 0 %
Eosinophils Absolute: 0.1 10*3/uL (ref 0.0–0.7)
Eosinophils Relative: 2 %
HCT: 32.8 % — ABNORMAL LOW (ref 36.0–46.0)
Hemoglobin: 9.7 g/dL — ABNORMAL LOW (ref 12.0–15.0)
Lymphocytes Relative: 14 %
Lymphs Abs: 1.1 10*3/uL (ref 0.7–4.0)
MCH: 25.9 pg — ABNORMAL LOW (ref 26.0–34.0)
MCHC: 29.6 g/dL — ABNORMAL LOW (ref 30.0–36.0)
MCV: 87.7 fL (ref 78.0–100.0)
Monocytes Absolute: 0.8 10*3/uL (ref 0.1–1.0)
Monocytes Relative: 10 %
Neutro Abs: 5.7 10*3/uL (ref 1.7–7.7)
Neutrophils Relative %: 74 %
Platelets: 232 10*3/uL (ref 150–400)
RBC: 3.74 MIL/uL — ABNORMAL LOW (ref 3.87–5.11)
RDW: 14.7 % (ref 11.5–15.5)
WBC: 7.7 10*3/uL (ref 4.0–10.5)

## 2016-07-26 LAB — HEPARIN LEVEL (UNFRACTIONATED)
HEPARIN UNFRACTIONATED: 0.12 [IU]/mL — AB (ref 0.30–0.70)
HEPARIN UNFRACTIONATED: 0.34 [IU]/mL (ref 0.30–0.70)

## 2016-07-26 LAB — APTT: aPTT: 28 seconds (ref 24–36)

## 2016-07-26 MED ORDER — HEPARIN (PORCINE) IN NACL 100-0.45 UNIT/ML-% IJ SOLN
1300.0000 [IU]/h | INTRAMUSCULAR | Status: DC
  Administered 2016-07-26: 1400 [IU]/h via INTRAVENOUS
  Filled 2016-07-26 (×4): qty 250

## 2016-07-26 NOTE — Progress Notes (Signed)
[facility]/Tricia Hernandez Progress Note 1 Day Post-Op  Subjective: Hernandez had a good night.  Not much bleeding.  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  Tricia Hernandez is not aware of Tricia diagnosis at this point.  Objective: Vital signs in last 24 hours: Temp:  [98 F (36.7 C)-98.4 F (36.9 C)] 98 F (36.7 C) (12/15 0703) Pulse Rate:  [82-125] 82 (12/15 0703) Resp:  [17-22] 18 (12/15 0703) BP: (99-152)/(43-99) 99/72 (12/15 0703) SpO2:  [95 %-100 %] 98 % (12/15 0703) Weight:  [111.3 kg (245 lb 6.4 oz)] 111.3 kg (245 lb 6.4 oz) (12/15 0703) Last BM Date: 07/25/16  Intake/Output from previous day: 12/14 0701 - 12/15 0700 In: 800 [I.V.:800] Out: 100 [Blood:100] Intake/Output this shift: No intake/output data recorded.  General: No distress, says she feels good.  Was cleaned this morning by Tricia staff  Lungs: Clear  Abd: Benign  Extremities: No changes  Neuro: Intact  Rectal:  Area of Tricia wound is clean and dry.  Will be able to restart heparin at 12 Noon.  Lab Results:  '@LABLAST2'$ (wbc:2,hgb:2,hct:2,plt:2) BMET ) Recent Labs  07/24/16 0248 07/26/16 0235  NA 141 138  K 4.0 4.4  CL 104 101  CO2 24 33*  GLUCOSE 105* 116*  BUN 7 5*  CREATININE 0.85 0.91  CALCIUM 8.6* 8.5*   PT/INR No results for input(s): LABPROT, INR in Tricia last 72 hours. ABG No results for input(s): PHART, HCO3 in Tricia last 72 hours.  Invalid input(s): PCO2, PO2  Studies/Results: Ct Chest Wo Contrast  Result Date: 07/25/2016 CLINICAL DATA:  Hernandez with history of lung cancer and right lung surgery. Newly diagnosed rectal mass. EXAM: CT CHEST WITHOUT CONTRAST TECHNIQUE: Multidetector CT imaging of Tricia chest was performed following Tricia standard protocol without IV contrast. COMPARISON:  CT abdomen pelvis 07/23/2016. FINDINGS: Cardiovascular: Calcified atherosclerotic plaque involving Tricia thoracic aorta. Heart is enlarged. No pericardial effusion. Coronary arterial vascular  calcifications. Mediastinum/Nodes: No enlarged axillary, mediastinal or hilar lymphadenopathy. Small hiatal hernia. Lungs/Pleura: Postsurgical changes within Tricia right mid lung. There is peribronchial consolidation within Tricia right middle lobe. Irregular nodules are demonstrated within Tricia right upper lobe measuring 10 mm (image 52; series 205) and 14 mm (image 50; series 205). Cluster of nodules demonstrated within Tricia right lower lobe measuring 2.1 x 1.8 cm in total (image 69; series 205). 4 mm subpleural left lower lobe nodule (image 88; series 205). 3 mm right lower lobe nodule (image 91; series 205). 3 mm left lower lobe nodule (image 64; series 205). 4 mm left lower lobe nodule (image 60; series 205). 10 mm ground-glass nodule within Tricia peripheral left upper lobe (image 43; series 205). Additional scattered 2-3 mm nodules within Tricia left upper and left lower lobes. No pleural effusion or pneumothorax. Upper Abdomen: Liver is normal in size and contour. Tricia adrenal glands are unremarkable. Musculoskeletal: Thoracic spine degenerative changes. No aggressive or acute appearing osseous lesions. IMPRESSION: Postsurgical changes within Tricia right hemithorax compatible Hernandez's history of lung malignancy. No priors are available for comparison. There are adjacent irregular nodules within Tricia right upper lobe and cluster of nodules within Tricia right lower lobe which are nonspecific in etiology given lack of priors for comparison. Primary pulmonary malignancy, metastatic disease or infectious/inflammatory processes are considered. Multiple additional smaller bilateral pulmonary nodules and focus of ground-glass opacities are demonstrated. Recommend comparison with outside prior examinations to assess for interval change size stability. Aortic atherosclerosis. Electronically Signed   By: Lovey Newcomer  M.D.   On: 07/25/2016 20:27    Anti-infectives: Anti-infectives    Start     Dose/Rate Route Frequency Ordered Stop    07/25/16 0800  meropenem (MERREM) 1 g in sodium chloride 0.9 % 100 mL IVPB     1 g 200 mL/hr over 30 Minutes Intravenous To [facility] 07/24/16 1713 07/25/16 1339      Assessment/Plan: s/p Procedure(s): EXAM UNDER ANESTHESIA, EXCISION PERIANAL MASS. Invasive squamous cell cancer  Hernandez need oncology consultation ASAP. She will like need a diverting end colostomy--Tricia area is likely to continue to leak stool contents and not heal well.  This couold be a permanent colostomy.   Would not convert to oral anticoagulation yet, but can restart heparin at noon.  LOS: 3 days   [name]. Dahlia Bailiff, MD, FACS [phone] [facility] 07/26/2016

## 2016-07-26 NOTE — Consult Note (Signed)
[facility] CONSULT NOTE  Patient Care Team: Clinton Quant, MD as PCP - General (Internal Medicine)  CHIEF COMPLAINTS/PURPOSE OF CONSULTATION:  Newly diagnosed anal cancer  HISTORY OF PRESENTING ILLNESS:  Tricia Hernandez 82 y.o. female is here because of recent diagnosis of squamous carcinoma the anus. Patient is a 82 year old lady with the history of pulmonary embolism and an unknown lung diagnosis status post excision of a lung nodule in 2013, hypertension, chronic anticoagulation for atrial fibrillation who presented with two-week history of rectal bleeding. She was referred to gastroenterology as an outpatient and was subsequently referred to general surgery. She was admitted to the hospital on 07/22/2016. Dr. Hulen Skains examined her in the operating room and found a left sided fissure with associated friable mucosa and a large left-sided subcutaneous mass. Biopsy of this mass revealed squamous cell carcinoma. CT of the abdomen and pelvis revealed bilateral inguinal lymphadenopathy. CT of the chest revealed bilateral lung nodules of unclear significance. These could be metastatic disease. However the patient and family notes that patient has had chronic lung nodules and one of them was then excised couple of years ago. Patient lives in [place].  I reviewed her records extensively and collaborated the history with the patient.  MEDICAL HISTORY:  Past Medical History:  Diagnosis Date  . Anxiety   . Atrial fibrillation ([place])   . Chronic bronchitis ([place])   . Chronic depression   . Diverticular disease   . Fibrocystic disease of breast   . Fracture of shoulder   . GERD (gastroesophageal reflux disease)   . Herpes zoster   . Hyperlipidemia   . Hypertension   . Labyrinthitis   . Lung cancer ([place])   . Obesity   . Paroxysmal supraventricular tachycardia ([place])   . Pulmonary embolus ([place])   . Vitamin D deficiency     SURGICAL HISTORY: Past Surgical History:   Procedure Laterality Date  . APPENDECTOMY    . BASAL CELL CARCINOMA EXCISION     nose  . breast cyst removal     right  . CATARACT EXTRACTION Bilateral   . CHOLECYSTECTOMY    . EVALUATION UNDER ANESTHESIA WITH HEMORRHOIDECTOMY AND PROCTOSCOPY N/A 07/25/2016   Procedure: EXAM UNDER ANESTHESIA, EXCISION PERIANAL MASS.;  Surgeon: Judeth Horn, MD;  Location: [facility];  Service: General;  Laterality: N/A;  Prone position  . LUNG REMOVAL, PARTIAL  2013   RML mass/carcinoid, Dr Lurena Nida  . TONSILLECTOMY AND ADENOIDECTOMY    . TOTAL VAGINAL HYSTERECTOMY      SOCIAL HISTORY: Social History   Social History  . Marital status: Single    Spouse name: N/A  . Number of children: N/A  . Years of education: N/A   Occupational History  . Not on file.   Social History Main Topics  . Smoking status: Former Smoker    Quit date: 1976  . Smokeless tobacco: Never Used  . Alcohol use No  . Drug use: No  . Sexual activity: Not on file   Other Topics Concern  . Not on file   Social History Narrative  . No narrative on file    FAMILY HISTORY: Family History  Problem Relation Age of Onset  . Heart attack Mother     Dec 1987  . CVA Mother   . Hypertension Mother   . Multiple myeloma Mother   . Breast cancer Sister   . Skin cancer Sister   . Prostate cancer Brother   . Throat cancer Brother   .  Cervical cancer Daughter   . Leukemia Daughter   . Leukemia Son   . Throat cancer Brother   . Cancer Brother     soft palette  . Colon cancer Neg Hx   . Pancreatic cancer Neg Hx     ALLERGIES:  is allergic to aleve [naproxen sodium]; antihistamines, chlorpheniramine-type; aspirin; ciprofloxacin; codeine; penicillins; rocephin [ceftriaxone sodium in dextrose]; sulfa antibiotics; and hydrochlorothiazide.  MEDICATIONS:  Current Facility-Administered Medications  Medication Dose Route Frequency Provider Last Rate Last Dose  . acetaminophen (TYLENOL) tablet 650 mg  650 mg Oral Q6H PRN Lavina Hamman, MD   650 mg at 07/24/16 1323  . albuterol (PROVENTIL) (2.5 MG/3ML) 0.083% nebulizer solution 2.5 mg  2.5 mg Nebulization Q6H PRN Lavina Hamman, MD      . atorvastatin (LIPITOR) tablet 20 mg  20 mg Oral Daily Lavina Hamman, MD   20 mg at 07/26/16 1022  . ferrous sulfate tablet 325 mg  325 mg Oral TID WC Lavina Hamman, MD   325 mg at 07/26/16 1248  . fluticasone (FLONASE) 50 MCG/ACT nasal spray 1 spray  1 spray Each Nare Daily Lavina Hamman, MD   1 spray at 07/26/16 1042  . folic acid (FOLVITE) tablet 1 mg  1 mg Oral Daily Lavina Hamman, MD   1 mg at 07/26/16 1022  . heparin ADULT infusion 100 units/mL (25000 units/245m sodium chloride 0.45%)  1,400 Units/hr Intravenous Continuous JJudeth Horn MD 14 mL/hr at 07/26/16 1255 1,400 Units/hr at 07/26/16 1255  . lip balm (BLISTEX) ointment   Topical PRN PLavina Hamman MD   1 application at 158/85/021020  . LORazepam (ATIVAN) tablet 0.5 mg  0.5 mg Oral Q8H PRN PLavina Hamman MD   0.5 mg at 07/23/16 1738  . metoprolol (LOPRESSOR) tablet 100 mg  100 mg Oral BID EDoreatha Lew MD   100 mg at 07/26/16 1022  . montelukast (SINGULAIR) tablet 10 mg  10 mg Oral Daily PLavina Hamman MD   10 mg at 07/26/16 1022  . ondansetron (ZOFRAN) tablet 4 mg  4 mg Oral Q6H PRN PLavina Hamman MD       Or  . ondansetron ([facility] injection 4 mg  4 mg Intravenous Q6H PRN PLavina Hamman MD   4 mg at 07/25/16 1654  . pantoprazole (PROTONIX) EC tablet 40 mg  40 mg Oral Daily PLavina Hamman MD   40 mg at 07/26/16 1022  . PARoxetine (PAXIL) tablet 20 mg  20 mg Oral Daily PLavina Hamman MD   20 mg at 07/25/16 2138  . polyethylene glycol (MIRALAX / GLYCOLAX) packet 17 g  17 g Oral Daily PLavina Hamman MD      . senna-docusate (Senokot-S) tablet 1 tablet  1 tablet Oral BID PLavina Hamman MD   1 tablet at 07/23/16 2147  . vitamin B-12 (CYANOCOBALAMIN) tablet 1,000 mcg  1,000 mcg Oral Daily PLavina Hamman MD   1,000 mcg at 07/26/16 1022    REVIEW OF SYSTEMS:    Constitutional: Denies fevers, chills or abnormal night sweats Eyes: Denies blurriness of vision, double vision or watery eyes Ears, nose, mouth, throat, and face: Hearing impairment Respiratory: Denies cough, dyspnea or wheezes Cardiovascular: Atrial fibrillation Gastrointestinal:  Denies nausea, heartburn or change in bowel habits Skin: Denies abnormal skin rashes Lymphatics: Denies new lymphadenopathy or easy bruising Neurological: Leg weakness bilaterally Behavioral/Psych: Mood is stable, no new changes  All other  systems were reviewed with the patient and are negative.  PHYSICAL EXAMINATION: ECOG PERFORMANCE STATUS: 3 - Symptomatic, >50% confined to bed  Vitals:   07/26/16 1022 07/26/16 1505  BP: 110/62 (!) 109/51  Pulse: 74 85  Resp:  18  Temp:  98.2 F (36.8 C)   Filed Weights   07/24/16 0530 07/25/16 0553 07/26/16 0703  Weight: 252 lb 11.2 oz (114.6 kg) 249 lb (112.9 kg) 245 lb 6.4 oz (111.3 kg)    GENERAL:alert, no distress and comfortable SKIN: skin color, texture, turgor are normal, no rashes or significant lesions EYES: normal, conjunctiva are pink and non-injected, sclera clear OROPHARYNX:no exudate, no erythema and lips, buccal mucosa, and tongue normal  NECK: supple, thyroid normal size, non-tender, without nodularity LYMPH:  no palpable lymphadenopathy in the cervical, axillary or inguinal LUNGS: clear to auscultation And diminished breath sounds at the bases HEART: Occasionally irregular ABDOMEN:abdomen soft, non-tender and normal bowel sounds Musculoskeletal:no cyanosis of digits and no clubbing  PSYCH: alert & oriented x 3 with fluent speech NEURO: no focal motor/sensory deficits  LABORATORY DATA:  I have reviewed the data as listed Lab Results  Component Value Date   WBC 7.7 07/26/2016   HGB 9.7 (L) 07/26/2016   HCT 32.8 (L) 07/26/2016   MCV 87.7 07/26/2016   PLT 232 07/26/2016   Lab Results  Component Value Date   NA 138 07/26/2016   K 4.4  07/26/2016   CL 101 07/26/2016   CO2 33 (H) 07/26/2016    RADIOGRAPHIC STUDIES: I have personally reviewed the radiological reports and agreed with the findings in the report.  ASSESSMENT AND PLAN:  1. Squamous cell carcinoma of the anus: I discussed with the patient that she has bilateral inguinal lymphadenopathy and bilateral lung nodules. It is unclear but the inguinal lymphadenopathy and the lung nodules could be metastatic disease.  it could be a stage IIIa versus stage IV.  2. treatment options discussed include radiation therapy to the anus with her without systemic chemotherapy. It is my opinion that adding systemic chemotherapy could be detrimental at her age and with her multiple comorbidities. Radiation alone also be curative because squamous cell carcinoma of the anus stents to be a very chemosensitive malignancy. Patient lives in [place] and the family would prefer to have this radiation closer to home. In order to accomplish radiation, patient may need diverting colostomy.   3. Lung nodules: I would like to obtain the records of her previous surgical excision of the lung nodule as well as a copy of previous CT scans to see whether these nodules have been present even previously.  I spent almost 2 hours discussing with the family and with the patient. Initially the family were reluctant in me telling the details of her diagnosis. However I convinced them that it is the right thing to do. And then patient has been well informed and the family are quite happy that we were able to have this discussion today.  All questions were answered. The patient knows to call the clinic with any problems, questions or concerns.    Rulon Eisenmenger, MD _0 @

## 2016-07-26 NOTE — Progress Notes (Signed)
ANTICOAGULATION CONSULT NOTE - Follow Up Consult  Pharmacy Consult for Heparin Indication: h/o PE and afib  Allergies  Allergen Reactions  . Aleve [Naproxen Sodium] Hives  . Antihistamines, Chlorpheniramine-Type     ANTIHISTAMINES  . Aspirin   . Ciprofloxacin   . Codeine   . Penicillins     Has patient had a PCN reaction causing immediate rash, facial/tongue/throat swelling, SOB or lightheadedness with hypotension: Yes Has patient had a PCN reaction causing severe rash involving mucus membranes or skin necrosis: No Has patient had a PCN reaction that required hospitalization No Has patient had a PCN reaction occurring within the last 10 years: No If all of the above answers are "NO", then may proceed with Cephalosporin use.   . Rocephin [Ceftriaxone Sodium In Dextrose]   . Sulfa Antibiotics   . Hydrochlorothiazide Rash    Patient Measurements: Height: '5\' 4"'$  (162.6 cm) Weight: 245 lb 6.4 oz (111.3 kg) IBW/kg (Calculated) : 54.7 Heparin Dosing Weight: 82.5kg  Vital Signs: Temp: 98 F (36.7 C) (12/15 0703) Temp Source: Oral (12/15 0703) BP: 99/72 (12/15 0703) Pulse Rate: 82 (12/15 0703)  Labs:  Recent Labs  07/24/16 0248 07/24/16 1635 07/25/16 0153 07/26/16 0235  HGB 9.3*  --  9.3* 9.7*  HCT 31.3*  --  31.7* 32.8*  PLT 226  --  205 232  APTT 36 98* 102* 28  HEPARINUNFRC 0.51 0.84* 0.85* 0.12*  CREATININE 0.85  --   --  0.91    Estimated Creatinine Clearance: 57.2 mL/min (by C-G formula based on SCr of 0.91 mg/dL).  Assessment: Tricia Hernandez on eliquis pta for h/o PE and afib (last dose 12/11 at 0739), transitioned to IV heparin pending OR for excision of perianal mass. S/P OR yesterday and ok to resume heparin today at 1200 per Dr. Hulen Skains. Eliquis should be cleared by now so will just use heparin levels to monitor. CBC is stable.   Goal of Therapy:  Heparin level 0.3-0.7 units/ml Monitor platelets by anticoagulation protocol: Yes   Plan:  1) At 1200, resume  heparin at 1400 units/hr 2) Check 8 hour heparin level 3) Daily heparin level and CBC  Tricia Hernandez 07/26/2016,8:24 AM

## 2016-07-26 NOTE — Progress Notes (Signed)
Biopsy is resulted showing squamous cell ca.  Dr. Geralyn Flash consult note was reviewed.  Patient may be a candidate for radiation treatment in [place], [place].  I will talk to her tomorrow regarding radiotherapy.  Patient may still benefit from diverting colostomy.

## 2016-07-26 NOTE — Progress Notes (Addendum)
PROGRESS NOTE [facility]   Claretha Townshend   WJX:[id number] DOB: 1931-01-04  DOA: 07/22/2016 PCP: Clinton Quant, MD   Brief Narrative:  82 year old female with past medical history of hypertension, hyperlipidemia, PSVT, PE, PAF on Eliquis presented to the emergency room with complaining of shortness of breath and fatigue and the BRBPR with rectal mass. Currently undergoing for rectal mass workup. Patient underwent on excisional rectal biopsy which revealed invasive squamous cell. Oncology consulted. Surgery planning for possible colostomy.   Subjective: Patient seen and examined with son at bedside. Patient s/p mass excision. Which revealed invasive SCC, Patient does not know the diagnosis yet, family wants to be together to deliver the news. Patient have no complaints, surgical site evaluated by surgical team with no complications.    Assessment & Plan: Rectal Mass - Invasive [facility] Oncology consulted  Surgery for poss colostomy  Palliative team consulted  Biopsy final results pending  Can resume heparin will hold on oral a/c in view of possible surgery   Bright red blood per rectum and rectal mass - s/p excisional biopsy  Symptomatic anemia - H&H stable Continue MiraLAX Transfuse for hemoglobin less than 7.  Hypertension - stable  History of CHF Cardiology consulted for clearance for surgical procedure.  Echo showed EF of 60-65% no wall motion abnormality. Elevated troponin likely secondary to uncontrolled Afib vs demand ischemia  No sings of fluid overload  BP stable   History of PE. A. Fib. - now well controlled that was started back on her medications CHA2DS2-VASc Score 5 Continue to hold Apixaban - resume heparin gtt in view of surgery Continue metoprolol    Pulmonary nodule. History of lung cancer. 57m nodule in the right lower lobe and 7 mm right upper lobe pulmonary nodule. Patient does have history of right sided lung cancer S/P surgical resection in the  past 2013. CT chest shows multiple lung nodules ? Metastasis   DVT prophylaxis: Eliquis Code Status: Full Family Communication: Son at bedside Disposition Plan: Home when medically stable.  Consultants:   Cardiology   Gen Surgery   Procedures:   ECHO  ------------------------------------------------------------------- Study Conclusions  - Left ventricle: The cavity size was normal. Wall thickness was   increased in a pattern of moderate LVH. Systolic function was   normal. The estimated ejection fraction was in the range of 60%   to 65%. Wall motion was normal; there were no regional wall   motion abnormalities.  Antimicrobials:  None    Objective: Vitals:   07/25/16 1531 07/25/16 2136 07/26/16 0703 07/26/16 1022  BP: 138/82 (!) 122/43 99/72 110/62  Pulse: (!) 106 (!) 102 82 74  Resp: '17 18 18   '$ Temp: 98.4 F (36.9 C) 98.1 F (36.7 C) 98 F (36.7 C)   TempSrc: Oral Oral Oral   SpO2: 95% 97% 98%   Weight:   111.3 kg (245 lb 6.4 oz)   Height:        Intake/Output Summary (Last 24 hours) at 07/26/16 1448 Last data filed at 07/26/16 1300  Gross per 24 hour  Intake              380 ml  Output                0 ml  Net              380 ml   Filed Weights   07/24/16 0530 07/25/16 0553 07/26/16 0703  Weight: 114.6 kg (252 lb 11.2 oz) 112.9 kg (  249 lb) 111.3 kg (245 lb 6.4 oz)    Examination:  General exam: NAD, hard hearing Respiratory system: CTA b/l  Cardiovascular system: S1S2 IRR IRR. No murmurs  Gastrointestinal system: Soft NTND, rectal exam deferred Central nervous system: Alert and oriented.  Extremities: No pedal edema  Data Reviewed: I have personally reviewed following labs and imaging studies  CBC:  Recent Labs Lab 07/22/16 2028 07/23/16 0653 07/24/16 0248 07/25/16 0153 07/26/16 0235  WBC 7.2 7.1 7.2 7.9 7.7  NEUTROABS 4.8 4.7 5.3 5.6 5.7  HGB 8.8* 9.1* 9.3* 9.3* 9.7*  HCT 29.9* 30.8* 31.3* 31.7* 32.8*  MCV 87.2 88.0 86.0 86.8  87.7  PLT 217 199 226 205 629   Basic Metabolic Panel:  Recent Labs Lab 07/22/16 1147 07/22/16 1529 07/23/16 0653 07/24/16 0248 07/26/16 0235  NA 140 142 141 141 138  K 4.3 4.0 4.0 4.0 4.4  CL 102 103 103 104 101  CO2 32 33* 31 24 33*  GLUCOSE 139* 124* 101* 105* 116*  BUN '12 11 9 7 '$ 5*  CREATININE 1.07 1.05* 0.93 0.85 0.91  CALCIUM 9.5 9.4 9.0 8.6* 8.5*  MG  --   --  1.8  --   --    GFR: Estimated Creatinine Clearance: 57.2 mL/min (by C-G formula based on SCr of 0.91 mg/dL). Liver Function Tests:  Recent Labs Lab 07/22/16 1147 07/23/16 0653 07/24/16 0248  AST '18 20 20  '$ ALT 10 12* 12*  ALKPHOS 41 37* 42  BILITOT 0.3 0.3 0.1*  PROT 6.2 5.4* 5.3*  ALBUMIN 3.5 2.9* 2.8*   No results for input(s): LIPASE, AMYLASE in the last 168 hours. No results for input(s): AMMONIA in the last 168 hours. Coagulation Profile:  Recent Labs Lab 07/22/16 2028  INR 1.30   Cardiac Enzymes:  Recent Labs Lab 07/22/16 2028 07/23/16 0035 07/23/16 0653  TROPONINI 0.08* 0.09* 0.10*   BNP (last 3 results) No results for input(s): PROBNP in the last 8760 hours. HbA1C: No results for input(s): HGBA1C in the last 72 hours. CBG: No results for input(s): GLUCAP in the last 168 hours. Lipid Profile: No results for input(s): CHOL, HDL, LDLCALC, TRIG, CHOLHDL, LDLDIRECT in the last 72 hours. Thyroid Function Tests: No results for input(s): TSH, T4TOTAL, FREET4, T3FREE, THYROIDAB in the last 72 hours. Anemia Panel: No results for input(s): VITAMINB12, FOLATE, FERRITIN, TIBC, IRON, RETICCTPCT in the last 72 hours. Sepsis Labs: No results for input(s): PROCALCITON, LATICACIDVEN in the last 168 hours.  No results found for this or any previous visit (from the past 240 hour(s)).    Radiology Studies: Ct Chest Wo Contrast  Result Date: 07/25/2016 CLINICAL DATA:  Patient with history of lung cancer and right lung surgery. Newly diagnosed rectal mass. EXAM: CT CHEST WITHOUT CONTRAST  TECHNIQUE: Multidetector CT imaging of the chest was performed following the standard protocol without IV contrast. COMPARISON:  CT abdomen pelvis 07/23/2016. FINDINGS: Cardiovascular: Calcified atherosclerotic plaque involving the thoracic aorta. Heart is enlarged. No pericardial effusion. Coronary arterial vascular calcifications. Mediastinum/Nodes: No enlarged axillary, mediastinal or hilar lymphadenopathy. Small hiatal hernia. Lungs/Pleura: Postsurgical changes within the right mid lung. There is peribronchial consolidation within the right middle lobe. Irregular nodules are demonstrated within the right upper lobe measuring 10 mm (image 52; series 205) and 14 mm (image 50; series 205). Cluster of nodules demonstrated within the right lower lobe measuring 2.1 x 1.8 cm in total (image 69; series 205). 4 mm subpleural left lower lobe nodule (image 88; series 205). 3  mm right lower lobe nodule (image 91; series 205). 3 mm left lower lobe nodule (image 64; series 205). 4 mm left lower lobe nodule (image 60; series 205). 10 mm ground-glass nodule within the peripheral left upper lobe (image 43; series 205). Additional scattered 2-3 mm nodules within the left upper and left lower lobes. No pleural effusion or pneumothorax. Upper Abdomen: Liver is normal in size and contour. The adrenal glands are unremarkable. Musculoskeletal: Thoracic spine degenerative changes. No aggressive or acute appearing osseous lesions. IMPRESSION: Postsurgical changes within the right hemithorax compatible patient's history of lung malignancy. No priors are available for comparison. There are adjacent irregular nodules within the right upper lobe and cluster of nodules within the right lower lobe which are nonspecific in etiology given lack of priors for comparison. Primary pulmonary malignancy, metastatic disease or infectious/inflammatory processes are considered. Multiple additional smaller bilateral pulmonary nodules and focus of  ground-glass opacities are demonstrated. Recommend comparison with outside prior examinations to assess for interval change size stability. Aortic atherosclerosis. Electronically Signed   By: Lovey Newcomer M.D.   On: 07/25/2016 20:27     Scheduled Meds: . atorvastatin  20 mg Oral Daily  . ferrous sulfate  325 mg Oral TID WC  . fluticasone  1 spray Each Nare Daily  . folic acid  1 mg Oral Daily  . metoprolol tartrate  100 mg Oral BID  . montelukast  10 mg Oral Daily  . pantoprazole  40 mg Oral Daily  . PARoxetine  20 mg Oral Daily  . polyethylene glycol  17 g Oral Daily  . senna-docusate  1 tablet Oral BID  . vitamin B-12  1,000 mcg Oral Daily   Continuous Infusions: . heparin 1,400 Units/hr (07/26/16 1255)     LOS: 3 days    Chipper Oman, MD [facility] Pager [phone]  If 7PM-7AM, please contact night-coverage [email or web address] Password [email or web address] 07/26/2016, 2:48 PM

## 2016-07-26 NOTE — Progress Notes (Signed)
1 Day Post-Op  Subjective: She is doing well, Dr. Hulen Skains has been in already.  Son is there and notes she is not aware of the diagnosis and wants all the children present before they talk.  No bleeding since surgery, she is on clears.  Objective: Vital signs in last 24 hours: Temp:  [98 F (36.7 C)-98.4 F (36.9 C)] 98 F (36.7 C) (12/15 0703) Pulse Rate:  [82-108] 82 (12/15 0703) Resp:  [17-22] 18 (12/15 0703) BP: (99-152)/(43-99) 99/72 (12/15 0703) SpO2:  [95 %-100 %] 98 % (12/15 0703) Weight:  [111.3 kg (245 lb 6.4 oz)] 111.3 kg (245 lb 6.4 oz) (12/15 0703) Last BM Date: 07/25/16 800 IV Voided x 1 recorded  Nothing PO recorded Afebrile, tachycardic yesterday down to 82 this AM 978% sat on 2 liters H/H is stable, WBC is normal BMP OK  CO2 is up some Heparin at 0.12 level  Intake/Output from previous day: 12/14 0701 - 12/15 0700 In: 800 [I.V.:800] Out: 100 [Blood:100] Intake/Output this shift: No intake/output data recorded.  General appearance: alert, cooperative and no distress Dr. Hulen Skains examined her earlier.  Lab Results:   Recent Labs  07/25/16 0153 07/26/16 0235  WBC 7.9 7.7  HGB 9.3* 9.7*  HCT 31.7* 32.8*  PLT 205 232    BMET  Recent Labs  07/24/16 0248 07/26/16 0235  NA 141 138  K 4.0 4.4  CL 104 101  CO2 24 33*  GLUCOSE 105* 116*  BUN 7 5*  CREATININE 0.85 0.91  CALCIUM 8.6* 8.5*   PT/INR No results for input(s): LABPROT, INR in the last 72 hours.   Recent Labs Lab 07/22/16 1147 07/23/16 0653 07/24/16 0248  AST '18 20 20  '$ ALT 10 12* 12*  ALKPHOS 41 37* 42  BILITOT 0.3 0.3 0.1*  PROT 6.2 5.4* 5.3*  ALBUMIN 3.5 2.9* 2.8*     Lipase  No results found for: LIPASE   Studies/Results: Ct Chest Wo Contrast  Result Date: 07/25/2016 CLINICAL DATA:  Patient with history of lung cancer and right lung surgery. Newly diagnosed rectal mass. EXAM: CT CHEST WITHOUT CONTRAST TECHNIQUE: Multidetector CT imaging of the chest was performed  following the standard protocol without IV contrast. COMPARISON:  CT abdomen pelvis 07/23/2016. FINDINGS: Cardiovascular: Calcified atherosclerotic plaque involving the thoracic aorta. Heart is enlarged. No pericardial effusion. Coronary arterial vascular calcifications. Mediastinum/Nodes: No enlarged axillary, mediastinal or hilar lymphadenopathy. Small hiatal hernia. Lungs/Pleura: Postsurgical changes within the right mid lung. There is peribronchial consolidation within the right middle lobe. Irregular nodules are demonstrated within the right upper lobe measuring 10 mm (image 52; series 205) and 14 mm (image 50; series 205). Cluster of nodules demonstrated within the right lower lobe measuring 2.1 x 1.8 cm in total (image 69; series 205). 4 mm subpleural left lower lobe nodule (image 88; series 205). 3 mm right lower lobe nodule (image 91; series 205). 3 mm left lower lobe nodule (image 64; series 205). 4 mm left lower lobe nodule (image 60; series 205). 10 mm ground-glass nodule within the peripheral left upper lobe (image 43; series 205). Additional scattered 2-3 mm nodules within the left upper and left lower lobes. No pleural effusion or pneumothorax. Upper Abdomen: Liver is normal in size and contour. The adrenal glands are unremarkable. Musculoskeletal: Thoracic spine degenerative changes. No aggressive or acute appearing osseous lesions. IMPRESSION: Postsurgical changes within the right hemithorax compatible patient's history of lung malignancy. No priors are available for comparison. There are adjacent irregular nodules within  the right upper lobe and cluster of nodules within the right lower lobe which are nonspecific in etiology given lack of priors for comparison. Primary pulmonary malignancy, metastatic disease or infectious/inflammatory processes are considered. Multiple additional smaller bilateral pulmonary nodules and focus of ground-glass opacities are demonstrated. Recommend comparison with  outside prior examinations to assess for interval change size stability. Aortic atherosclerosis. Electronically Signed   By: Lovey Newcomer M.D.   On: 07/25/2016 20:27   Prior to Admission medications   Medication Sig Start Date End Date Taking? Authorizing Provider  albuterol (PROVENTIL HFA;VENTOLIN HFA) 108 (90 Base) MCG/ACT inhaler Inhale 2 puffs into the lungs every 6 (six) hours as needed for wheezing or shortness of breath.   Yes Historical Provider, MD  apixaban (ELIQUIS) 5 MG TABS tablet Take 5 mg by mouth 2 (two) times daily.   Yes Historical Provider, MD  atorvastatin (LIPITOR) 20 MG tablet Take 20 mg by mouth daily.   Yes Historical Provider, MD  Calcium Carbonate-Vitamin D (CALCIUM 600+D) 600-400 MG-UNIT tablet Take 2 tablets by mouth daily.   Yes Historical Provider, MD  Cholecalciferol (VITAMIN D3) 1000 units CAPS Take 1 capsule by mouth daily.   Yes Historical Provider, MD  esomeprazole (NEXIUM) 40 MG capsule Take 40 mg by mouth daily at 12 noon.   Yes Historical Provider, MD  fenofibrate 160 MG tablet Take 160 mg by mouth daily.   Yes Historical Provider, MD  Ferrous Sulfate (IRON) 325 (65 Fe) MG TABS Take 1 tablet by mouth 2 (two) times daily.    Yes Historical Provider, MD  fluticasone (FLONASE) 50 MCG/ACT nasal spray Place 1 spray into both nostrils daily.   Yes Historical Provider, MD  folic acid (FOLVITE) 932 MCG tablet Take 400 mcg by mouth daily.   Yes Historical Provider, MD  ipratropium-albuterol (DUONEB) 0.5-2.5 (3) MG/3ML SOLN Take 3 mLs by nebulization 2 (two) times daily.   Yes Historical Provider, MD  LORazepam (ATIVAN) 0.5 MG tablet Take 0.5 mg by mouth every 8 (eight) hours as needed for anxiety.   Yes Historical Provider, MD  metoprolol (LOPRESSOR) 100 MG tablet Take 100 mg by mouth 2 (two) times daily.   Yes Historical Provider, MD  montelukast (SINGULAIR) 10 MG tablet Take 10 mg by mouth daily.   Yes Historical Provider, MD  Multiple Vitamin (MULTIVITAMIN WITH  MINERALS) TABS tablet Take 1 tablet by mouth daily.   Yes Historical Provider, MD  PARoxetine (PAXIL) 20 MG tablet Take 20 mg by mouth daily.   Yes Historical Provider, MD  pyridOXINE (VITAMIN B-6) 100 MG tablet Take 100 mg by mouth daily.   Yes Historical Provider, MD  vitamin B-12 (CYANOCOBALAMIN) 1000 MCG tablet Take 1,000 mcg by mouth daily.   Yes Historical Provider, MD  docusate sodium (COLACE) 100 MG capsule Take 100 mg by mouth daily as needed for mild constipation.    Historical Provider, MD  hydrocortisone (ANUSOL-HC) 25 MG suppository Place 25 mg rectally 2 (two) times daily.    Historical Provider, MD  trimethoprim (TRIMPEX) 100 MG tablet Take 100 mg by mouth 2 (two) times daily.    Historical Provider, MD    Medications: . atorvastatin  20 mg Oral Daily  . ferrous sulfate  325 mg Oral TID WC  . fluticasone  1 spray Each Nare Daily  . folic acid  1 mg Oral Daily  . metoprolol tartrate  100 mg Oral BID  . montelukast  10 mg Oral Daily  . pantoprazole  40 mg  Oral Daily  . PARoxetine  20 mg Oral Daily  . polyethylene glycol  17 g Oral Daily  . senna-docusate  1 tablet Oral BID  . vitamin B-12  1,000 mcg Oral Daily   . heparin      Assessment/Plan Rectal Mass with bleeding - invasive squamous cell carcinoma S/p EXAM UNDER ANESTHESIA, EXCISIONAL BIOPSY OF ANAL MASS, ANOSCOPY, TRU-CUT NEEDLE BIOPSY OF LEFT SIDED SUBCUTANEOUS MASS, 07/25/16, Dr. Hulen Skains Hypertension Hx of CHF Hx of PE Hx of AF- on Apixaban(Eliquis) at home Hx of lung cancer with resection 2013 FEN: Heparin/clear liquids ID: [id number] x 1 dose only DVT:  Heparin drip    Plan:   Await final path, oncology consult, on clears.  I have talked with Oncology and they will see today.    LOS: 3 days    Tricia Hernandez 07/26/2016 [phone]

## 2016-07-26 NOTE — Progress Notes (Addendum)
ANTICOAGULATION CONSULT NOTE - FOLLOW UP    HL = 0.34 (goal 0.3 - 0.7 units/mL) Heparin dosing weight = 83 kg   Assessment: 7 YOF with history of PE and AFib to continue on IV heparin while Eliquis is on hold.  Heparin level is therapeutic; no bleeding reported.   Plan: - Continue heparin gtt at 1400 units/hr - F/U AM labs    Nicoli Nardozzi D. Mina Marble, PharmD, BCPS 07/26/2016, 9:40 PM

## 2016-07-26 NOTE — Assessment & Plan Note (Addendum)
Final pathology pending  Possible lung metastases on chest CT, consider PET if clinically appropriate

## 2016-07-26 NOTE — Progress Notes (Signed)
    Cardiology following peripherally as no acute issue since putting her back on BB. Would continue BB (now taking PO) convert to IV if she is NPO.  Would not restart DOAC until procedures complete.    Continue statin.   We will monitor & be available for assistance.  Glenetta Hew, MD

## 2016-07-26 NOTE — Progress Notes (Signed)
  Radiation Oncology         [phone] ________________________________  Name: Tricia Hernandez MRN: [id number]  Date: 07/22/2016  DOB: Jan 08, 1931  Chart Note:  I reviewed this patient's most recent findings and wanted to take a minute to document my impression.  Newly diagnosed anal cancer presenting with rectal bleeding.  If patient now stable, will await pathology and staging work-up for formal consultation.  ________________________________  Sheral Apley [name], M.D.

## 2016-07-27 DIAGNOSIS — I10 Essential (primary) hypertension: Secondary | ICD-10-CM

## 2016-07-27 DIAGNOSIS — R918 Other nonspecific abnormal finding of lung field: Secondary | ICD-10-CM

## 2016-07-27 DIAGNOSIS — Z515 Encounter for palliative care: Secondary | ICD-10-CM

## 2016-07-27 LAB — HEPARIN LEVEL (UNFRACTIONATED)
HEPARIN UNFRACTIONATED: 0.59 [IU]/mL (ref 0.30–0.70)
Heparin Unfractionated: 0.62 IU/mL (ref 0.30–0.70)

## 2016-07-27 LAB — CBC WITH DIFFERENTIAL/PLATELET
BASOS ABS: 0 10*3/uL (ref 0.0–0.1)
BASOS PCT: 0 %
EOS ABS: 0.3 10*3/uL (ref 0.0–0.7)
Eosinophils Relative: 4 %
HCT: 32 % — ABNORMAL LOW (ref 36.0–46.0)
HEMOGLOBIN: 9.3 g/dL — AB (ref 12.0–15.0)
Lymphocytes Relative: 22 %
Lymphs Abs: 1.3 10*3/uL (ref 0.7–4.0)
MCH: 25.8 pg — ABNORMAL LOW (ref 26.0–34.0)
MCHC: 29.1 g/dL — AB (ref 30.0–36.0)
MCV: 88.6 fL (ref 78.0–100.0)
MONOS PCT: 11 %
Monocytes Absolute: 0.6 10*3/uL (ref 0.1–1.0)
NEUTROS ABS: 3.9 10*3/uL (ref 1.7–7.7)
NEUTROS PCT: 63 %
Platelets: 218 10*3/uL (ref 150–400)
RBC: 3.61 MIL/uL — ABNORMAL LOW (ref 3.87–5.11)
RDW: 14.6 % (ref 11.5–15.5)
WBC: 6.1 10*3/uL (ref 4.0–10.5)

## 2016-07-27 LAB — DIRECT ANTIGLOBULIN TEST (NOT AT ARMC)
DAT, IgG: NEGATIVE
DAT, complement: NEGATIVE

## 2016-07-27 MED ORDER — BOOST / RESOURCE BREEZE PO LIQD: 1.0000 | Freq: Three times a day (TID) | ORAL | Status: DC

## 2016-07-27 MED ORDER — ENSURE ENLIVE PO LIQD
237.0000 mL | Freq: Two times a day (BID) | ORAL | Status: DC
  Administered 2016-07-27 – 2016-08-02 (×9): 237 mL via ORAL

## 2016-07-27 NOTE — Progress Notes (Signed)
PROGRESS NOTE [facility]   Madge Therrien   UJW:[id number] DOB: [birth year]  DOA: 07/22/2016 PCP: Clinton Quant, MD   Brief Narrative:  82 year old female with past medical history of hypertension, hyperlipidemia, PSVT, PE, PAF on Eliquis presented to the emergency room with complaining of shortness of breath and fatigue and the BRBPR with rectal mass. Currently undergoing for rectal mass workup. Patient underwent on excisional rectal biopsy which revealed invasive squamous cell. Oncology consulted. Surgery planning for possible diverting colostomy on Monday. Patient will be set up for radiation at [facility] where she live.   Subjective: Patient seen and examined with son at bedside. Patient doing well. No rectal bleed, have some stool incontinence. No complaints. No acute events. Plan for surgery on Monday.   Assessment & Plan: Anal Squamous Cell Ca  Oncology consulted appreciated recommendations  Surgery plan for diverting colostomy on Monday  Palliative team consulted for support  Biopsy final results pending   Bright red blood per rectum and rectal mass - s/p excisional biopsy - Resolved  Symptomatic anemia - H&H stable See Above Continue MiraLAX Transfuse for hemoglobin less than 7.  Hypertension - stable  History of CHF Cardiology consulted for clearance for surgical procedure.  Echo showed EF of 60-65% no wall motion abnormality. Elevated troponin likely secondary to uncontrolled Afib vs demand ischemia  No sings of fluid overload  BP stable   History of PE. A. Fib. - continues to be on Afib but well controlled  CHA2DS2-VASc Score 5 Continue to hold Apixaban - heparin gtt in view of surgery Continue metoprolol    Pulmonary nodule. History of lung cancer. 43m nodule in the right lower lobe and 7 mm right upper lobe pulmonary nodule. Patient does have history of right sided lung cancer S/P surgical resection in the past 2013. CT chest shows multiple lung  nodules ? Metastasis   DVT prophylaxis: Eliquis Code Status: Full Family Communication: Son at bedside Disposition Plan: Home when medically stable.  Consultants:   Cardiology   Gen Surgery   Procedures:   ECHO  ------------------------------------------------------------------- Study Conclusions  - Left ventricle: The cavity size was normal. Wall thickness was   increased in a pattern of moderate LVH. Systolic function was   normal. The estimated ejection fraction was in the range of 60%   to 65%. Wall motion was normal; there were no regional wall   motion abnormalities.  Antimicrobials:  None    Objective: Vitals:   07/26/16 1505 07/26/16 2127 07/27/16 0445 07/27/16 0503  BP: (!) 109/51 (!) 112/55 113/64   Pulse: 85 98 89   Resp: '18 17 18   '$ Temp: 98.2 F (36.8 C) 98.5 F (36.9 C) 98.3 F (36.8 C)   TempSrc: Oral Oral Oral   SpO2: 98% 98% 98%   Weight:    111.7 kg (246 lb 4.1 oz)  Height:        Intake/Output Summary (Last 24 hours) at 07/27/16 1323 Last data filed at 07/26/16 1700  Gross per 24 hour  Intake              200 ml  Output                0 ml  Net              200 ml   Filed Weights   07/25/16 0553 07/26/16 0703 07/27/16 0503  Weight: 112.9 kg (249 lb) 111.3 kg (245 lb 6.4 oz) 111.7 kg (246 lb 4.1 oz)  Examination: - No changes in physical exam   General exam: NAD, hard hearing Respiratory system: CTA b/l  Cardiovascular system: S1S2 IRR IRR. No murmurs  Gastrointestinal system: Soft NTND,   Extremities: No pedal edema  Data Reviewed: I have personally reviewed following labs and imaging studies  CBC:  Recent Labs Lab 07/23/16 0653 07/24/16 0248 07/25/16 0153 07/26/16 0235 07/27/16 0353  WBC 7.1 7.2 7.9 7.7 6.1  NEUTROABS 4.7 5.3 5.6 5.7 3.9  HGB 9.1* 9.3* 9.3* 9.7* 9.3*  HCT 30.8* 31.3* 31.7* 32.8* 32.0*  MCV 88.0 86.0 86.8 87.7 88.6  PLT 199 226 205 232 384   Basic Metabolic Panel:  Recent Labs Lab  07/22/16 1147 07/22/16 1529 07/23/16 0653 07/24/16 0248 07/26/16 0235  NA 140 142 141 141 138  K 4.3 4.0 4.0 4.0 4.4  CL 102 103 103 104 101  CO2 32 33* 31 24 33*  GLUCOSE 139* 124* 101* 105* 116*  BUN '12 11 9 7 '$ 5*  CREATININE 1.07 1.05* 0.93 0.85 0.91  CALCIUM 9.5 9.4 9.0 8.6* 8.5*  MG  --   --  1.8  --   --    GFR: Estimated Creatinine Clearance: 57.3 mL/min (by C-G formula based on SCr of 0.91 mg/dL). Liver Function Tests:  Recent Labs Lab 07/22/16 1147 07/23/16 0653 07/24/16 0248  AST '18 20 20  '$ ALT 10 12* 12*  ALKPHOS 41 37* 42  BILITOT 0.3 0.3 0.1*  PROT 6.2 5.4* 5.3*  ALBUMIN 3.5 2.9* 2.8*   No results for input(s): LIPASE, AMYLASE in the last 168 hours. No results for input(s): AMMONIA in the last 168 hours. Coagulation Profile:  Recent Labs Lab 07/22/16 2028  INR 1.30   Cardiac Enzymes:  Recent Labs Lab 07/22/16 2028 07/23/16 0035 07/23/16 0653  TROPONINI 0.08* 0.09* 0.10*   BNP (last 3 results) No results for input(s): PROBNP in the last 8760 hours. HbA1C: No results for input(s): HGBA1C in the last 72 hours. CBG: No results for input(s): GLUCAP in the last 168 hours. Lipid Profile: No results for input(s): CHOL, HDL, LDLCALC, TRIG, CHOLHDL, LDLDIRECT in the last 72 hours. Thyroid Function Tests: No results for input(s): TSH, T4TOTAL, FREET4, T3FREE, THYROIDAB in the last 72 hours. Anemia Panel: No results for input(s): VITAMINB12, FOLATE, FERRITIN, TIBC, IRON, RETICCTPCT in the last 72 hours. Sepsis Labs: No results for input(s): PROCALCITON, LATICACIDVEN in the last 168 hours.  No results found for this or any previous visit (from the past 240 hour(s)).    Radiology Studies: Ct Chest Wo Contrast  Result Date: 07/25/2016 CLINICAL DATA:  Patient with history of lung cancer and right lung surgery. Newly diagnosed rectal mass. EXAM: CT CHEST WITHOUT CONTRAST TECHNIQUE: Multidetector CT imaging of the chest was performed following the  standard protocol without IV contrast. COMPARISON:  CT abdomen pelvis 07/23/2016. FINDINGS: Cardiovascular: Calcified atherosclerotic plaque involving the thoracic aorta. Heart is enlarged. No pericardial effusion. Coronary arterial vascular calcifications. Mediastinum/Nodes: No enlarged axillary, mediastinal or hilar lymphadenopathy. Small hiatal hernia. Lungs/Pleura: Postsurgical changes within the right mid lung. There is peribronchial consolidation within the right middle lobe. Irregular nodules are demonstrated within the right upper lobe measuring 10 mm (image 52; series 205) and 14 mm (image 50; series 205). Cluster of nodules demonstrated within the right lower lobe measuring 2.1 x 1.8 cm in total (image 69; series 205). 4 mm subpleural left lower lobe nodule (image 88; series 205). 3 mm right lower lobe nodule (image 91; series 205). 3 mm left  lower lobe nodule (image 64; series 205). 4 mm left lower lobe nodule (image 60; series 205). 10 mm ground-glass nodule within the peripheral left upper lobe (image 43; series 205). Additional scattered 2-3 mm nodules within the left upper and left lower lobes. No pleural effusion or pneumothorax. Upper Abdomen: Liver is normal in size and contour. The adrenal glands are unremarkable. Musculoskeletal: Thoracic spine degenerative changes. No aggressive or acute appearing osseous lesions. IMPRESSION: Postsurgical changes within the right hemithorax compatible patient's history of lung malignancy. No priors are available for comparison. There are adjacent irregular nodules within the right upper lobe and cluster of nodules within the right lower lobe which are nonspecific in etiology given lack of priors for comparison. Primary pulmonary malignancy, metastatic disease or infectious/inflammatory processes are considered. Multiple additional smaller bilateral pulmonary nodules and focus of ground-glass opacities are demonstrated. Recommend comparison with outside prior  examinations to assess for interval change size stability. Aortic atherosclerosis. Electronically Signed   By: Lovey Newcomer M.D.   On: 07/25/2016 20:27     Scheduled Meds: . atorvastatin  20 mg Oral Daily  . feeding supplement  1 Container Oral TID BM  . ferrous sulfate  325 mg Oral TID WC  . fluticasone  1 spray Each Nare Daily  . folic acid  1 mg Oral Daily  . metoprolol tartrate  100 mg Oral BID  . montelukast  10 mg Oral Daily  . pantoprazole  40 mg Oral Daily  . PARoxetine  20 mg Oral Daily  . polyethylene glycol  17 g Oral Daily  . senna-docusate  1 tablet Oral BID  . vitamin B-12  1,000 mcg Oral Daily   Continuous Infusions: . heparin 1,400 Units/hr (07/26/16 1255)     LOS: 4 days    Chipper Oman, MD [facility] Pager [phone]  If 7PM-7AM, please contact night-coverage [email or web address] Password [email or web address] 07/27/2016, 1:23 PM

## 2016-07-27 NOTE — Consult Note (Signed)
Consultation Note Date: 07/27/2016   Patient Name: Tricia Hernandez  DOB: 12-Mar-1931  MRN: [id number]  Age / Sex: 82 y.o., female  PCP: Clinton Quant, MD Referring Physician: Doreatha Lew, MD  Reason for Consultation: Support with new cancer diagnosis  HPI/Patient Profile: 82 y.o. female  with past medical history of hypertension, hyperlipidemia, PSVT, PE, A. fib on Eliquis admitted on 07/22/2016 with SOB, fatigue, BRBPR with rectal mass. Dr. Lindi Adie has consulted and guiding care regarding rectal mass. Pending surgery for diverting colostomy Monday 12/18.   Clinical Assessment and Goals of Care: I met today with [name] (who is hard of hearing) along with her daughter and son-in-law, Collie Siad and Denita Lung Laverna Peace is in remission from rectal cancer 8 years and had colostomy as well). They are very welcoming of palliative care and all we can offer to assist them through this illness. Collie Siad called her niece, whom she says works in the [place], and she had recommended palliative care. She had no specific questions at this time.   They expressed frustration with her suffering with constant stool leaking and bleeding over the past 6 months without any answers. Her family was not aware of the severity of her problem but thought she was having some diarrhea and had hemorrhoids (as they say she was being treated for). However, they say they are happy to be here and have some answers and some plans. [name] expresses great satisfaction in her care and providers here.   They are interested in treatment (surgery and radiation) and they verbalize that chemotherapy would not be good for her. They are anxiously awaiting more information and specifically ask about the goals of treatment. Goals are to pursue treatment and optimize QOL. Emotional support provided.   Primary Decision Maker NEXT OF KIN children    SUMMARY OF RECOMMENDATIONS   - Pursue surgery and radiation - Maximize QOL - They do actively seek any information or expectations. Unfortunately they have had multiple family members (pt's mother, sister, brother, daughter, son, son-in-law) with multiple cancer and understand exactly how severe and difficult this could be for her.  - They are interested in outpatient palliative care in the [place]:  Full code - did not discuss today   Symptom Management:   Discomfort in chair - will order air redistribution pad for chair. Overall she says her pain/discomfort is much improved.   No insomnia.   Good appetite although she fears the acid in Boost and requests Ensure instead - I have ordered. Will need to be changed if diet changes to clear liquids.   Palliative Prophylaxis:   Bowel Regimen, Frequent Pain Assessment and Turn Reposition   Psycho-social/Spiritual:   Desire for further Chaplaincy support:yes  Additional Recommendations: Caregiving  Support/Resources  Prognosis:   Unable to determine  Discharge Planning: To Be Determined      Primary Diagnoses: Present on Admission: . SOB (shortness of breath) . Symptomatic anemia . Anal cancer ([place]) . BRBPR (bright red  blood per rectum) . Elevated troponin . Pulmonary embolus ([place]) . Obesity . Hypertension . Lung cancer ([place]) . Atrial fibrillation ([place]), CHA2DS2-VASc Score 5 . Shortness of breath   I have reviewed the medical record, interviewed the patient and family, and examined the patient. The following aspects are pertinent.  Past Medical History:  Diagnosis Date  . Anxiety   . Atrial fibrillation ([place])   . Chronic bronchitis ([place])   . Chronic depression   . Diverticular disease   . Fibrocystic disease of breast   . Fracture of shoulder   . GERD (gastroesophageal reflux disease)   . Herpes zoster   . Hyperlipidemia   . Hypertension   . Labyrinthitis   .  Lung cancer ([place])   . Obesity   . Paroxysmal supraventricular tachycardia ([place])   . Pulmonary embolus ([place])   . Vitamin D deficiency    Social History   Social History  . Marital status: Single    Spouse name: N/A  . Number of children: N/A  . Years of education: N/A   Social History Main Topics  . Smoking status: Former Smoker    Quit date: 1976  . Smokeless tobacco: Never Used  . Alcohol use No  . Drug use: No  . Sexual activity: Not Asked   Other Topics Concern  . None   Social History Narrative  . None   Family History  Problem Relation Age of Onset  . Heart attack Mother     Dec 1987  . CVA Mother   . Hypertension Mother   . Multiple myeloma Mother   . Breast cancer Sister   . Skin cancer Sister   . Prostate cancer Brother   . Throat cancer Brother   . Cervical cancer Daughter   . Leukemia Daughter   . Leukemia Son   . Throat cancer Brother   . Cancer Brother     soft palette  . Colon cancer Neg Hx   . Pancreatic cancer Neg Hx    Scheduled Meds: . atorvastatin  20 mg Oral Daily  . feeding supplement (ENSURE ENLIVE)  237 mL Oral BID BM  . ferrous sulfate  325 mg Oral TID WC  . fluticasone  1 spray Each Nare Daily  . folic acid  1 mg Oral Daily  . metoprolol tartrate  100 mg Oral BID  . montelukast  10 mg Oral Daily  . pantoprazole  40 mg Oral Daily  . PARoxetine  20 mg Oral Daily  . polyethylene glycol  17 g Oral Daily  . senna-docusate  1 tablet Oral BID  . vitamin B-12  1,000 mcg Oral Daily   Continuous Infusions: . heparin 1,400 Units/hr (07/26/16 1255)   PRN Meds:.acetaminophen **OR** [DISCONTINUED] acetaminophen, albuterol, lip balm, LORazepam, ondansetron **OR** ondansetron (ZOFRAN) IV Allergies  Allergen Reactions  . Aleve [Naproxen Sodium] Hives  . Antihistamines, Chlorpheniramine-Type     ANTIHISTAMINES  . Aspirin   . Ciprofloxacin   . Codeine   . Penicillins     Has patient had a PCN reaction causing immediate rash,  facial/tongue/throat swelling, SOB or lightheadedness with hypotension: Yes Has patient had a PCN reaction causing severe rash involving mucus membranes or skin necrosis: No Has patient had a PCN reaction that required hospitalization No Has patient had a PCN reaction occurring within the last 10 years: No If all of the above answers are "NO", then may proceed with Cephalosporin use.   . Rocephin [Ceftriaxone Sodium In Dextrose]   .  Sulfa Antibiotics   . Hydrochlorothiazide Rash   Review of Systems  Constitutional: Positive for activity change. Negative for appetite change.  Gastrointestinal: Positive for anal bleeding, blood in stool and rectal pain.    Physical Exam  Constitutional: She is oriented to person, place, and time. She appears well-developed and well-nourished.  HENT:  Head: Normocephalic and atraumatic.  Right Ear: Decreased hearing is noted.  Left Ear: Decreased hearing is noted.  Cardiovascular: Normal rate.  An irregularly irregular rhythm present.  Pulmonary/Chest: Effort normal. No accessory muscle usage. No tachypnea. No respiratory distress.  Abdominal: Soft. Normal appearance.  Neurological: She is alert and oriented to person, place, and time.  Nursing note and vitals reviewed.   Vital Signs: BP (!) 105/55 (BP Location: Left Arm)   Pulse 89   Temp 98.3 F (36.8 C) (Oral)   Resp 20   Ht 5' 4"  (1.626 m)   Wt 111.7 kg (246 lb 4.1 oz)   SpO2 97%   BMI 42.27 kg/m  Pain Assessment: No/denies pain   Pain Score: 0-No pain   SpO2: SpO2: 97 % O2 Device:SpO2: 97 % O2 Flow Rate: .O2 Flow Rate (L/min): 2 L/min  IO: Intake/output summary:  Intake/Output Summary (Last 24 hours) at 07/27/16 1908 Last data filed at 07/27/16 1230  Gross per 24 hour  Intake              480 ml  Output                0 ml  Net              480 ml    LBM: Last BM Date: 07/26/16 Baseline Weight: Weight: 115.7 kg (255 lb) Most recent weight: Weight: 111.7 kg (246 lb 4.1 oz)      Palliative Assessment/Data: PPS: 60%   Flowsheet Rows   Flowsheet Row Most Recent Value  Intake Tab  Referral Department  Hospitalist  Unit at Time of Referral  Cardiac/Telemetry Unit  Palliative Care Primary Diagnosis  Cancer  Date Notified  07/26/16  Palliative Care Type  New Palliative care  Reason for referral  Clarify Goals of Care  Date of Admission  07/22/16  # of days IP prior to Palliative referral  4  Clinical Assessment  Psychosocial & Spiritual Assessment  Palliative Care Outcomes      Time In: 1600 Time Out: 1710 Time Total: 21mn Greater than 50%  of this time was spent counseling and coordinating care related to the above assessment and plan.  Signed by: AVinie Sill NP Palliative Medicine Team Pager # [phone](M-F 8a-5p) Team Phone # [phone](Nights/Weekends)

## 2016-07-27 NOTE — Consult Note (Signed)
[place] Nurse ostomy consult note [place] Nurse consultation request for preoperative stoma site selection and marking by Dr. Ok Anis. A LLQ stoma is planned.  Two sites are provided:  RUQ ileostomy and LLQ colostomy suitable for loop or end ostomy.  Patient has a good understanding of the reason she is receiving this marking, is aware of the surgery scheduled for Monday and is at peace with the outcome.  Her large family is visiting her at the time of my arrival, her son-in law and daughter remain in room for the marking.  Son-in-law had a temporary ileostomy performed by Dr. Johney Maine in 2009 at [facility]. Her daughter assisted her husband with his ostomy for the 6 months he had a stoma and will be a tremendous support to her mother in the perioperative and post acute phases. Discussed surgical procedure and stoma creation with patient and family, including differences between a right sided and left sided ostomy.  Explained role of the [place] nurse team.  Provided the patient declined education booklet today and said it would be "easier on her family" to bring a book on Monday. She reminds me several times that she likes to have her HOB elevated or to be seated for interactions.    The patient is examined sitting and standing in order to place the marking in the patient's visual field, away from any creases or abdominal contour issues and within the rectus muscle. Her abdomen is rotund and soft and she has a deep crease in the pubis and in the inframammary area.    Marked for colostomy (end or loop) in the LLQ  8.5cm cm to the left of the umbilicus and 3cm below the umbilicus.  Marked for ileostomy  in the RUQ 7.25 cm to the right of the umbilicus and 6.4PP above the umbilicus.  Patient's abdomen cleansed x2 with CHG wipes at site markings, allowed to air dry prior to marking.Covered marks with thin film transparent dressing to preserve mark until date of surgery.   [place] nursing teamwill remain available to this  patient, the nursing, surgical and medical teams.  Thank you for allowing [place] to meet this delightful patient prior to her surgery.  Maudie Flakes, MSN, RN, [place], [facility]  Pager# [phone]

## 2016-07-27 NOTE — Progress Notes (Signed)
ANTICOAGULATION CONSULT NOTE - Follow Up Consult  Pharmacy Consult for Heparin Indication: h/o PE and afib  Allergies  Allergen Reactions  . Aleve [Naproxen Sodium] Hives  . Antihistamines, Chlorpheniramine-Type     ANTIHISTAMINES  . Aspirin   . Ciprofloxacin   . Codeine   . Penicillins     Has patient had a PCN reaction causing immediate rash, facial/tongue/throat swelling, SOB or lightheadedness with hypotension: Yes Has patient had a PCN reaction causing severe rash involving mucus membranes or skin necrosis: No Has patient had a PCN reaction that required hospitalization No Has patient had a PCN reaction occurring within the last 10 years: No If all of the above answers are "NO", then may proceed with Cephalosporin use.   . Rocephin [Ceftriaxone Sodium In Dextrose]   . Sulfa Antibiotics   . Hydrochlorothiazide Rash    Patient Measurements: Height: '5\' 4"'$  (162.6 cm) Weight: 246 lb 4.1 oz (111.7 kg) IBW/kg (Calculated) : 54.7 Heparin Dosing Weight: 82.5kg  Vital Signs: Temp: 98.3 F (36.8 C) (12/16 0445) Temp Source: Oral (12/16 0445) BP: 113/64 (12/16 0445) Pulse Rate: 89 (12/16 0445)  Labs:  Recent Labs  07/24/16 1635  07/25/16 0153 07/26/16 0235 07/26/16 2037 07/27/16 0353  HGB  --   < > 9.3* 9.7*  --  9.3*  HCT  --   --  31.7* 32.8*  --  32.0*  PLT  --   --  205 232  --  218  APTT 98*  --  102* 28  --   --   HEPARINUNFRC 0.84*  --  0.85* 0.12* 0.34 0.59  CREATININE  --   --   --  0.91  --   --   < > = values in this interval not displayed.  Estimated Creatinine Clearance: 57.3 mL/min (by C-G formula based on SCr of 0.91 mg/dL).  Assessment: 85yof on eliquis pta for h/o PE and afib (last dose 12/11 at 0739), transitioned to IV heparin pending OR for excision of perianal mass. S/P OR on 12/14 and heparin infusion resumed, heparin level therapeutic 0.59, with another anticipated trip to the OR on 12/18 for diverting colostomy. CBC is stable. Patient with  stool incontinence, but no bleeding reported.  Goal of Therapy:  Heparin level 0.3-0.7 units/ml Monitor platelets by anticoagulation protocol: Yes   Plan:  1) Continue heparin 1400 units/hr 2) Check 8 hour heparin level 3) Daily heparin level and CBC  Tricia Hernandez L Lean Tricia Hernandez 07/27/2016,11:54 AM

## 2016-07-27 NOTE — Progress Notes (Signed)
HEMATOLOGY-ONCOLOGY PROGRESS NOTE  SUBJECTIVE:No bleeding from anus. Stool incontinence  OBJECTIVE: REVIEW OF SYSTEMS:   Constitutional: Denies fevers, chills or abnormal weight loss, gen weakness and fatigue. Eyes: Denies blurriness of vision Ears, nose, mouth, throat, and face: Denies mucositis or sore throat Respiratory: Denies cough, dyspnea or wheezes Cardiovascular: Denies palpitation, chest discomfort Gastrointestinal:  Denies nausea, heartburn or change in bowel habits Skin: Denies abnormal skin rashes Lymphatics: Denies new lymphadenopathy or easy bruising Neurological:Denies numbness, tingling or new weaknesses Behavioral/Psych: Mood is stable, no new changes  Extremities: No lower extremity edema All other systems were reviewed with the patient and are negative.  I have reviewed the past medical history, past surgical history, social history and family history with the patient and they are unchanged from previous note.   PHYSICAL EXAMINATION: ECOG PERFORMANCE STATUS: 3 - Symptomatic, >50% confined to bed  Vitals:   07/26/16 2127 07/27/16 0445  BP: (!) 112/55 113/64  Pulse: 98 89  Resp: 17 18  Temp: 98.5 F (36.9 C) 98.3 F (36.8 C)   Filed Weights   07/25/16 0553 07/26/16 0703 07/27/16 0503  Weight: 249 lb (112.9 kg) 245 lb 6.4 oz (111.3 kg) 246 lb 4.1 oz (111.7 kg)    GENERAL:alert, no distress and comfortable SKIN: skin color, texture, turgor are normal, no rashes or significant lesions EYES: normal, Conjunctiva are pink and non-injected, sclera clear OROPHARYNX:no exudate, no erythema and lips, buccal mucosa, and tongue normal  NECK: supple, thyroid normal size, non-tender, without nodularity LYMPH:  no palpable lymphadenopathy in the cervical, axillary or inguinal LUNGS: clear to auscultation and percussion with normal breathing effort HEART: regular rate & rhythm and no murmurs and no lower extremity edema ABDOMEN:abdomen soft, non-tender and normal  bowel sounds Musculoskeletal:no cyanosis of digits and no clubbing  NEURO: alert & oriented x 3 with fluent speech, no focal motor/sensory deficits  LABORATORY DATA:  I have reviewed the data as listed CMP Latest Ref Rng & Units 07/26/2016 07/24/2016 07/23/2016  Glucose 65 - 99 mg/dL 116(H) 105(H) 101(H)  BUN 6 - 20 mg/dL 5(L) 7 9  Creatinine 0.44 - 1.00 mg/dL 0.91 0.85 0.93  Sodium 135 - 145 mmol/L 138 141 141  Potassium 3.5 - 5.1 mmol/L 4.4 4.0 4.0  Chloride 101 - 111 mmol/L 101 104 103  CO2 22 - 32 mmol/L 33(H) 24 31  Calcium 8.9 - 10.3 mg/dL 8.5(L) 8.6(L) 9.0  Total Protein 6.5 - 8.1 g/dL - 5.3(L) 5.4(L)  Total Bilirubin 0.3 - 1.2 mg/dL - 0.1(L) 0.3  Alkaline Phos 38 - 126 U/L - 42 37(L)  AST 15 - 41 U/L - 20 20  ALT 14 - 54 U/L - 12(L) 12(L)    Lab Results  Component Value Date   WBC 6.1 07/27/2016   HGB 9.3 (L) 07/27/2016   HCT 32.0 (L) 07/27/2016   MCV 88.6 07/27/2016   PLT 218 07/27/2016   NEUTROABS 3.9 07/27/2016    ASSESSMENT AND PLAN: 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 She will need a PET scan as out-patient Dr.Wilson met with her and discussed diverting colostomy on Monday  2. Upon discharge, she can be followed at [facility] (closer to home) for radiation +/- chemo 3. Given her age, radiation alone might be her best option. 4. 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 DD: met carcinoid vs Met Sq cell ca of anus PET might help but without biopsy, it  would still be hard to confirm  Will meet her tomorrow to see if any of the reports arrive

## 2016-07-27 NOTE — Progress Notes (Signed)
ANTICOAGULATION CONSULT NOTE - Follow Up Consult  Pharmacy Consult for heparin Indication: Hx of PE and Afib  Allergies  Allergen Reactions  . Aleve [Naproxen Sodium] Hives  . Antihistamines, Chlorpheniramine-Type     ANTIHISTAMINES  . Aspirin   . Ciprofloxacin   . Codeine   . Penicillins     Has patient had a PCN reaction causing immediate rash, facial/tongue/throat swelling, SOB or lightheadedness with hypotension: Yes Has patient had a PCN reaction causing severe rash involving mucus membranes or skin necrosis: No Has patient had a PCN reaction that required hospitalization No Has patient had a PCN reaction occurring within the last 10 years: No If all of the above answers are "NO", then may proceed with Cephalosporin use.   . Rocephin [Ceftriaxone Sodium In Dextrose]   . Sulfa Antibiotics   . Hydrochlorothiazide Rash    Patient Measurements: Height: '5\' 4"'$  (162.6 cm) Weight: 246 lb 4.1 oz (111.7 kg) IBW/kg (Calculated) : 54.7 Heparin Dosing Weight: 81 kg  Vital Signs: BP: 105/55 (12/16 1300) Pulse Rate: 89 (12/16 1300)  Labs:  Recent Labs  07/25/16 0153 07/26/16 0235 07/26/16 2037 07/27/16 0353 07/27/16 1624  HGB 9.3* 9.7*  --  9.3*  --   HCT 31.7* 32.8*  --  32.0*  --   PLT 205 232  --  218  --   APTT 102* 28  --   --   --   HEPARINUNFRC 0.85* 0.12* 0.34 0.59 0.62  CREATININE  --  0.91  --   --   --     Estimated Creatinine Clearance: 57.3 mL/min (by C-G formula based on SCr of 0.91 mg/dL).   Medications:  Prescriptions Prior to Admission  Medication Sig Dispense Refill Last Dose  . albuterol (PROVENTIL HFA;VENTOLIN HFA) 108 (90 Base) MCG/ACT inhaler Inhale 2 puffs into the lungs every 6 (six) hours as needed for wheezing or shortness of breath.   Past Month at Unknown time  . apixaban (ELIQUIS) 5 MG TABS tablet Take 5 mg by mouth 2 (two) times daily.   07/22/2016 at 0739  . atorvastatin (LIPITOR) 20 MG tablet Take 20 mg by mouth daily.   07/22/2016 at  Unknown time  . Calcium Carbonate-Vitamin D (CALCIUM 600+D) 600-400 MG-UNIT tablet Take 2 tablets by mouth daily.   07/22/2016 at Unknown time  . Cholecalciferol (VITAMIN D3) 1000 units CAPS Take 1 capsule by mouth daily.   07/22/2016 at Unknown time  . esomeprazole (NEXIUM) 40 MG capsule Take 40 mg by mouth daily at 12 noon.   07/22/2016 at Unknown time  . fenofibrate 160 MG tablet Take 160 mg by mouth daily.   07/22/2016 at Unknown time  . Ferrous Sulfate (IRON) 325 (65 Fe) MG TABS Take 1 tablet by mouth 2 (two) times daily.    07/22/2016 at Unknown time  . fluticasone (FLONASE) 50 MCG/ACT nasal spray Place 1 spray into both nostrils daily.   07/22/2016 at Unknown time  . folic acid (FOLVITE) 329 MCG tablet Take 400 mcg by mouth daily.   07/22/2016 at Unknown time  . ipratropium-albuterol (DUONEB) 0.5-2.5 (3) MG/3ML SOLN Take 3 mLs by nebulization 2 (two) times daily.   07/22/2016 at Unknown time  . LORazepam (ATIVAN) 0.5 MG tablet Take 0.5 mg by mouth every 8 (eight) hours as needed for anxiety.   07/22/2016 at Unknown time  . metoprolol (LOPRESSOR) 100 MG tablet Take 100 mg by mouth 2 (two) times daily.   07/21/2016 at 2200  . montelukast (SINGULAIR)  10 MG tablet Take 10 mg by mouth daily.   07/22/2016 at Unknown time  . Multiple Vitamin (MULTIVITAMIN WITH MINERALS) TABS tablet Take 1 tablet by mouth daily.   07/22/2016 at Unknown time  . PARoxetine (PAXIL) 20 MG tablet Take 20 mg by mouth daily.   07/21/2016 at Unknown time  . pyridOXINE (VITAMIN B-6) 100 MG tablet Take 100 mg by mouth daily.   07/22/2016 at Unknown time  . vitamin B-12 (CYANOCOBALAMIN) 1000 MCG tablet Take 1,000 mcg by mouth daily.   07/22/2016 at Unknown time  . docusate sodium (COLACE) 100 MG capsule Take 100 mg by mouth daily as needed for mild constipation.   Not Taking at Unknown time  . hydrocortisone (ANUSOL-HC) 25 MG suppository Place 25 mg rectally 2 (two) times daily.   Not Taking at Unknown time  . trimethoprim  (TRIMPEX) 100 MG tablet Take 100 mg by mouth 2 (two) times daily.   Not Taking at Unknown time    Assessment: 82 year old female admitted 07/22/2016 on Eliquis PTA for history of PE and Afib (Last dose PTA 12/11 '@0739'$ ). Pharmacy consulted to dose heparin while Eliquis on hold for anticipated OR trip on 12/18 for diverting colostomy.   Heparin level 0.62 (therapeutic), Hgb 9.3 stable, Plt wnl. No s/sx of bleeding noted.  Goal of Therapy:  Heparin level 0.3-0.7 units/ml Monitor platelets by anticoagulation protocol: Yes   Plan:  - Continue heparin 1400 units/hr - Monitor daily heparin level, CBC and s/sx of bleeding  Dimitri Ped, PharmD, BCPS PGY-2 Infectious Diseases Pharmacy Resident Pager: [phone] 07/27/2016,5:17 PM

## 2016-07-27 NOTE — Progress Notes (Signed)
2 Days Post-Op  Subjective: No c/o. No n/v. Having BMs- incontinent; some oozing from cancer site per nurse. Brother at [facility] along with oncologist  Objective: Vital signs in last 24 hours: Temp:  [98.2 F (36.8 C)-98.5 F (36.9 C)] 98.3 F (36.8 C) (12/16 0445) Pulse Rate:  [74-98] 89 (12/16 0445) Resp:  [17-18] 18 (12/16 0445) BP: (109-113)/(51-64) 113/64 (12/16 0445) SpO2:  [98 %] 98 % (12/16 0445) Weight:  [111.7 kg (246 lb 4.1 oz)] 111.7 kg (246 lb 4.1 oz) (12/16 0503) Last BM Date: 07/26/16  Intake/Output from previous day: 12/15 0701 - 12/16 0700 In: 580 [P.O.:580] Out: -  Intake/Output this shift: No intake/output data recorded.  Alert, hard of hearing but appropriate Soft, nt, nd, obese, old right subcostal incision, old lower midline incision  Lab Results:   Recent Labs  07/26/16 0235 07/27/16 0353  WBC 7.7 6.1  HGB 9.7* 9.3*  HCT 32.8* 32.0*  PLT 232 218   BMET  Recent Labs  07/26/16 0235  NA 138  K 4.4  CL 101  CO2 33*  GLUCOSE 116*  BUN 5*  CREATININE 0.91  CALCIUM 8.5*   PT/INR No results for input(s): LABPROT, INR in the last 72 hours. ABG No results for input(s): PHART, HCO3 in the last 72 hours.  Invalid input(s): PCO2, PO2  Studies/Results: Ct Chest Wo Contrast  Result Date: 07/25/2016 CLINICAL DATA:  Patient with history of lung cancer and right lung surgery. Newly diagnosed rectal mass. EXAM: CT CHEST WITHOUT CONTRAST TECHNIQUE: Multidetector CT imaging of the chest was performed following the standard protocol without IV contrast. COMPARISON:  CT abdomen pelvis 07/23/2016. FINDINGS: Cardiovascular: Calcified atherosclerotic plaque involving the thoracic aorta. Heart is enlarged. No pericardial effusion. Coronary arterial vascular calcifications. Mediastinum/Nodes: No enlarged axillary, mediastinal or hilar lymphadenopathy. Small hiatal hernia. Lungs/Pleura: Postsurgical changes within the right mid lung. There is peribronchial  consolidation within the right middle lobe. Irregular nodules are demonstrated within the right upper lobe measuring 10 mm (image 52; series 205) and 14 mm (image 50; series 205). Cluster of nodules demonstrated within the right lower lobe measuring 2.1 x 1.8 cm in total (image 69; series 205). 4 mm subpleural left lower lobe nodule (image 88; series 205). 3 mm right lower lobe nodule (image 91; series 205). 3 mm left lower lobe nodule (image 64; series 205). 4 mm left lower lobe nodule (image 60; series 205). 10 mm ground-glass nodule within the peripheral left upper lobe (image 43; series 205). Additional scattered 2-3 mm nodules within the left upper and left lower lobes. No pleural effusion or pneumothorax. Upper Abdomen: Liver is normal in size and contour. The adrenal glands are unremarkable. Musculoskeletal: Thoracic spine degenerative changes. No aggressive or acute appearing osseous lesions. IMPRESSION: Postsurgical changes within the right hemithorax compatible patient's history of lung malignancy. No priors are available for comparison. There are adjacent irregular nodules within the right upper lobe and cluster of nodules within the right lower lobe which are nonspecific in etiology given lack of priors for comparison. Primary pulmonary malignancy, metastatic disease or infectious/inflammatory processes are considered. Multiple additional smaller bilateral pulmonary nodules and focus of ground-glass opacities are demonstrated. Recommend comparison with outside prior examinations to assess for interval change size stability. Aortic atherosclerosis. Electronically Signed   By: Lovey Newcomer M.D.   On: 07/25/2016 20:27    Anti-infectives: Anti-infectives    Start     Dose/Rate Route Frequency Ordered Stop   07/25/16 0800  meropenem (MERREM) 1 g  in sodium chloride 0.9 % 100 mL IVPB     1 g 200 mL/hr over 30 Minutes Intravenous To ShortStay Surgical 07/24/16 1713 07/25/16 1339       Assessment/Plan: Rectal Mass with bleeding - invasive squamous cell carcinoma S/p EXAM UNDER ANESTHESIA, EXCISIONAL BIOPSY OF ANAL MASS, ANOSCOPY, TRU-CUT NEEDLE BIOPSY OF LEFT SIDED SUBCUTANEOUS MASS, 07/25/16, Dr. Hulen Skains Hypertension Hx of CHF Hx of PE Hx of AF- on Apixaban(Eliquis) at home Hx of lung cancer with resection 2013 FEN: Heparin/ will back down to full liquids; add protein shakes for anticiptated colostomy procedure ID: Meropenem x 1 dose only DVT:  Heparin drip  Given fecal incontinence and need for pelvic irradiation, we concur diverting colostomy is indicated. Discussed with pt and she is on board with that.   Will plan on doing this tentatively on Monday by Dr Kae Heller - lap assisted colostomy, poss open.  Will have wound care mark pt on Monday  Vernell Townley M. Redmond Pulling, MD, FACS General, Bariatric, & Minimally Invasive Surgery [facility], [place]   LOS: 4 days    Gayland Curry 07/27/2016

## 2016-07-28 LAB — CBC WITH DIFFERENTIAL/PLATELET
BASOS ABS: 0 10*3/uL (ref 0.0–0.1)
Basophils Relative: 0 %
EOS PCT: 3 %
Eosinophils Absolute: 0.2 10*3/uL (ref 0.0–0.7)
HCT: 31 % — ABNORMAL LOW (ref 36.0–46.0)
Hemoglobin: 9.1 g/dL — ABNORMAL LOW (ref 12.0–15.0)
LYMPHS PCT: 21 %
Lymphs Abs: 1.3 10*3/uL (ref 0.7–4.0)
MCH: 25.7 pg — ABNORMAL LOW (ref 26.0–34.0)
MCHC: 29.4 g/dL — ABNORMAL LOW (ref 30.0–36.0)
MCV: 87.6 fL (ref 78.0–100.0)
Monocytes Absolute: 0.7 10*3/uL (ref 0.1–1.0)
Monocytes Relative: 11 %
NEUTROS PCT: 65 %
Neutro Abs: 4 10*3/uL (ref 1.7–7.7)
PLATELETS: 214 10*3/uL (ref 150–400)
RBC: 3.54 MIL/uL — ABNORMAL LOW (ref 3.87–5.11)
RDW: 14.3 % (ref 11.5–15.5)
WBC: 6.2 10*3/uL (ref 4.0–10.5)

## 2016-07-28 LAB — HEPARIN LEVEL (UNFRACTIONATED)
Heparin Unfractionated: 0.71 IU/mL — ABNORMAL HIGH (ref 0.30–0.70)
Heparin Unfractionated: 0.64 IU/mL (ref 0.30–0.70)

## 2016-07-28 LAB — PREALBUMIN: PREALBUMIN: 12.2 mg/dL — AB (ref 18–38)

## 2016-07-28 MED ORDER — CHLORHEXIDINE GLUCONATE CLOTH 2 % EX PADS: 6.0000 | MEDICATED_PAD | Freq: Once | CUTANEOUS | Status: DC

## 2016-07-28 MED ORDER — CHLORHEXIDINE GLUCONATE CLOTH 2 % EX PADS
6.0000 | MEDICATED_PAD | Freq: Once | CUTANEOUS | Status: AC
  Administered 2016-07-28: 6 via TOPICAL

## 2016-07-28 MED ORDER — HYDROCODONE-ACETAMINOPHEN 5-325 MG PO TABS
1.0000 | ORAL_TABLET | ORAL | Status: DC | PRN
  Administered 2016-07-29: 1 via ORAL
  Filled 2016-07-28: qty 2
  Filled 2016-07-28 (×2): qty 1

## 2016-07-28 NOTE — Progress Notes (Signed)
3 Days Post-Op  Subjective: Pt in good spirits   Objective: Vital signs in last 24 hours: Temp:  [98.1 F (36.7 C)-98.7 F (37.1 C)] 98.1 F (36.7 C) (12/17 0700) Pulse Rate:  [80-100] 80 (12/17 0700) Resp:  [16-20] 16 (12/17 0700) BP: (102-110)/(55-60) 102/60 (12/17 0700) SpO2:  [97 %-100 %] 100 % (12/17 0700) Weight:  [117.1 kg (258 lb 2.5 oz)] 117.1 kg (258 lb 2.5 oz) (12/17 0700) Last BM Date: 07/27/16  Intake/Output from previous day: 12/16 0701 - 12/17 0700 In: 1013.2 [P.O.:480; I.V.:533.2] Out: -  Intake/Output this shift: No intake/output data recorded.  General appearance: alert and cooperative Rectum not examined today  Lab Results:   Recent Labs  07/27/16 0353 07/28/16 0246  WBC 6.1 6.2  HGB 9.3* 9.1*  HCT 32.0* 31.0*  PLT 218 214   BMET  Recent Labs  07/26/16 0235  NA 138  K 4.4  CL 101  CO2 33*  GLUCOSE 116*  BUN 5*  CREATININE 0.91  CALCIUM 8.5*   PT/INR No results for input(s): LABPROT, INR in the last 72 hours. ABG No results for input(s): PHART, HCO3 in the last 72 hours.  Invalid input(s): PCO2, PO2  Studies/Results: No results found.  Anti-infectives: Anti-infectives    Start     Dose/Rate Route Frequency Ordered Stop   07/25/16 0800  meropenem (MERREM) 1 g in sodium chloride 0.9 % 100 mL IVPB     1 g 200 mL/hr over 30 Minutes Intravenous To ShortStay Surgical 07/24/16 1713 07/25/16 1339      Assessment/Plan: s/p Procedure(s) with comments: EXAM UNDER ANESTHESIA, EXCISION PERIANAL MASS. (N/A) - Prone position Needs diversion colostomy  Set up for Monday   LOS: 5 days    Alante Tolan A. 07/28/2016

## 2016-07-28 NOTE — Progress Notes (Signed)
ANTICOAGULATION CONSULT NOTE - Follow Up Consult  Pharmacy Consult for heparin Indication: Hx of PE and Afib  Allergies  Allergen Reactions  . Aleve [Naproxen Sodium] Hives  . Antihistamines, Chlorpheniramine-Type     ANTIHISTAMINES  . Aspirin   . Ciprofloxacin   . Codeine   . Penicillins     Has patient had a PCN reaction causing immediate rash, facial/tongue/throat swelling, SOB or lightheadedness with hypotension: Yes Has patient had a PCN reaction causing severe rash involving mucus membranes or skin necrosis: No Has patient had a PCN reaction that required hospitalization No Has patient had a PCN reaction occurring within the last 10 years: No If all of the above answers are "NO", then may proceed with Cephalosporin use.   . Rocephin [Ceftriaxone Sodium In Dextrose]   . Sulfa Antibiotics   . Hydrochlorothiazide Rash    Patient Measurements: Height: '5\' 4"'$  (162.6 cm) Weight: 258 lb 2.5 oz (117.1 kg) IBW/kg (Calculated) : 54.7 Heparin Dosing Weight: 81 kg  Vital Signs: Temp: 98.1 F (36.7 C) (12/17 0700) Temp Source: Oral (12/17 0700) BP: 102/60 (12/17 0700) Pulse Rate: 80 (12/17 0700)  Labs:  Recent Labs  07/26/16 0235  07/27/16 0353 07/27/16 1624 07/28/16 0246 07/28/16 1244  HGB 9.7*  --  9.3*  --  9.1*  --   HCT 32.8*  --  32.0*  --  31.0*  --   PLT 232  --  218  --  214  --   APTT 28  --   --   --   --   --   HEPARINUNFRC 0.12*  < > 0.59 0.62 0.71* 0.64  CREATININE 0.91  --   --   --   --   --   < > = values in this interval not displayed.  Estimated Creatinine Clearance: 58.9 mL/min (by C-G formula based on SCr of 0.91 mg/dL).   Medications:  Prescriptions Prior to Admission  Medication Sig Dispense Refill Last Dose  . albuterol (PROVENTIL HFA;VENTOLIN HFA) 108 (90 Base) MCG/ACT inhaler Inhale 2 puffs into the lungs every 6 (six) hours as needed for wheezing or shortness of breath.   Past Month at Unknown time  . apixaban (ELIQUIS) 5 MG TABS tablet  Take 5 mg by mouth 2 (two) times daily.   07/22/2016 at 0739  . atorvastatin (LIPITOR) 20 MG tablet Take 20 mg by mouth daily.   07/22/2016 at Unknown time  . Calcium Carbonate-Vitamin D (CALCIUM 600+D) 600-400 MG-UNIT tablet Take 2 tablets by mouth daily.   07/22/2016 at Unknown time  . Cholecalciferol (VITAMIN D3) 1000 units CAPS Take 1 capsule by mouth daily.   07/22/2016 at Unknown time  . esomeprazole (NEXIUM) 40 MG capsule Take 40 mg by mouth daily at 12 noon.   07/22/2016 at Unknown time  . fenofibrate 160 MG tablet Take 160 mg by mouth daily.   07/22/2016 at Unknown time  . Ferrous Sulfate (IRON) 325 (65 Fe) MG TABS Take 1 tablet by mouth 2 (two) times daily.    07/22/2016 at Unknown time  . fluticasone (FLONASE) 50 MCG/ACT nasal spray Place 1 spray into both nostrils daily.   07/22/2016 at Unknown time  . folic acid (FOLVITE) 086 MCG tablet Take 400 mcg by mouth daily.   07/22/2016 at Unknown time  . ipratropium-albuterol (DUONEB) 0.5-2.5 (3) MG/3ML SOLN Take 3 mLs by nebulization 2 (two) times daily.   07/22/2016 at Unknown time  . LORazepam (ATIVAN) 0.5 MG tablet Take 0.5 mg  by mouth every 8 (eight) hours as needed for anxiety.   07/22/2016 at Unknown time  . metoprolol (LOPRESSOR) 100 MG tablet Take 100 mg by mouth 2 (two) times daily.   07/21/2016 at 2200  . montelukast (SINGULAIR) 10 MG tablet Take 10 mg by mouth daily.   07/22/2016 at Unknown time  . Multiple Vitamin (MULTIVITAMIN WITH MINERALS) TABS tablet Take 1 tablet by mouth daily.   07/22/2016 at Unknown time  . PARoxetine (PAXIL) 20 MG tablet Take 20 mg by mouth daily.   07/21/2016 at Unknown time  . pyridOXINE (VITAMIN B-6) 100 MG tablet Take 100 mg by mouth daily.   07/22/2016 at Unknown time  . vitamin B-12 (CYANOCOBALAMIN) 1000 MCG tablet Take 1,000 mcg by mouth daily.   07/22/2016 at Unknown time  . docusate sodium (COLACE) 100 MG capsule Take 100 mg by mouth daily as needed for mild constipation.   Not Taking at Unknown  time  . hydrocortisone (ANUSOL-HC) 25 MG suppository Place 25 mg rectally 2 (two) times daily.   Not Taking at Unknown time  . trimethoprim (TRIMPEX) 100 MG tablet Take 100 mg by mouth 2 (two) times daily.   Not Taking at Unknown time   Assessment: 82 year old female admitted 07/22/2016 on Eliquis PTA for history of PE and Afib (Last dose PTA 12/11). Pharmacy consulted to dose heparin while Eliquis on hold for anticipated OR trip on 12/18 for diverting colostomy.   Heparin level 0.64 (therapeutic), Hgb 9.1 stable, Plt wnl. No infusion issues or bleeding reported.  Goal of Therapy:  Heparin level 0.3-0.7 units/ml Monitor platelets by anticoagulation protocol: Yes   Plan:  - Continue heparin 1300 units/hr - Monitor daily heparin level, CBC and s/sx of bleeding  Georga Bora, PharmD Clinical Pharmacist Pager: [phone] 07/28/2016 2:14 PM

## 2016-07-28 NOTE — Progress Notes (Signed)
ANTICOAGULATION CONSULT NOTE - Follow Up Consult  Pharmacy Consult for heparin Indication: Afib and h/o PE  Labs:  Recent Labs  07/26/16 0235  07/27/16 0353 07/27/16 1624 07/28/16 0246  HGB 9.7*  --  9.3*  --  9.1*  HCT 32.8*  --  32.0*  --  31.0*  PLT 232  --  218  --  214  APTT 28  --   --   --   --   HEPARINUNFRC 0.12*  < > 0.59 0.62 0.71*  CREATININE 0.91  --   --   --   --   < > = values in this interval not displayed.   Assessment: 82yo female now slightly above goal on heparin after three levels at goal though had been trending up.  Goal of Therapy:  Heparin level 0.3-0.7 units/ml   Plan:  Will decrease heparin gtt slightly to 1300 units/hr and check level in [place], PharmD, BCPS  07/28/2016,4:32 AM

## 2016-07-28 NOTE — Progress Notes (Addendum)
PROGRESS NOTE [facility]   Anniebelle Devore   VEL:[id number] DOB: 1931/05/25  DOA: 07/22/2016 PCP: Clinton Quant, MD   Brief Narrative:  82 year old female with past medical history of hypertension, hyperlipidemia, PSVT, PE, PAF on Eliquis presented to the emergency room with complaining of shortness of breath and fatigue and BRBPR with rectal mass. Patient underwent on excisional rectal biopsy which revealed invasive anal squamous cell CA. Oncology was consulted. Surgery planning for possible diverting colostomy on Monday. Patient need to be set up for radiation at [facility] where she live.   Subjective: Patient seen and examined at bedside. No new complaints. Tenderness at the surgical site. Continues to have stool leakage.   Assessment & Plan: Anal Squamous Cell Ca  Oncology consulted appreciated recommendations  Surgery plan for diverting colostomy on Monday  Palliative team consulted for support - appreciated  PET scan as outpatient   Bright red blood per rectum and rectal mass - s/p excisional biopsy - Resolved  Symptomatic anemia - H&H stable Continue MiraLAX Transfuse for hemoglobin less than 7.  Hypertension - stable  History of CHF Cardiology consulted for clearance for surgical procedure.  Echo showed EF of 60-65% no wall motion abnormality. Elevated troponin likely secondary to uncontrolled Afib vs demand ischemia  No sings of fluid overload  BP stable   History of PE. A. Fib. - continues to be on Afib but well controlled  CHA2DS2-VASc Score 5 Continue to hold Apixaban - heparin gtt in view of surgery Continue metoprolol    Pulmonary nodule. History of lung cancer. 34m nodule in the right lower lobe and 7 mm right upper lobe pulmonary nodule. Patient does have history of right sided lung cancer S/P surgical resection in the past 2013. CT chest shows multiple lung nodules ? Metastasis vs residual from previous lung cancer  DVT prophylaxis:  Eliquis Code Status: Full Family Communication: Son at bedside Disposition Plan: Home when medically stable.  Consultants:   Cardiology   Gen Surgery   Oncology   Procedures:   ECHO  ------------------------------------------------------------------- Study Conclusions  - Left ventricle: The cavity size was normal. Wall thickness was   increased in a pattern of moderate LVH. Systolic function was   normal. The estimated ejection fraction was in the range of 60%   to 65%. Wall motion was normal; there were no regional wall   motion abnormalities.  Antimicrobials:  None    Objective: Vitals:   07/27/16 0503 07/27/16 1300 07/27/16 2106 07/28/16 0700  BP:  (!) 105/55 110/60 102/60  Pulse:  89 100 80  Resp:  '20 18 16  '$ Temp:   98.7 F (37.1 C) 98.1 F (36.7 C)  TempSrc:   Oral Oral  SpO2:  97% 97% 100%  Weight: 111.7 kg (246 lb 4.1 oz)   117.1 kg (258 lb 2.5 oz)  Height:        Intake/Output Summary (Last 24 hours) at 07/28/16 1315 Last data filed at 07/28/16 0300  Gross per 24 hour  Intake           533.17 ml  Output                0 ml  Net           533.17 ml   Filed Weights   07/26/16 0703 07/27/16 0503 07/28/16 0700  Weight: 111.3 kg (245 lb 6.4 oz) 111.7 kg (246 lb 4.1 oz) 117.1 kg (258 lb 2.5 oz)    Examination: - No changes in  physical exam   General exam: NAD, hard hearing Respiratory system: CTA  Cardiovascular system: IRR IRR. No murmurs  Gastrointestinal system: Soft NTND GU: Incision about 3 cm, clean, package in place. Some serosanguinous drainage    Extremities: No edema  Data Reviewed: I have personally reviewed following labs and imaging studies  CBC:  Recent Labs Lab 07/24/16 0248 07/25/16 0153 07/26/16 0235 07/27/16 0353 07/28/16 0246  WBC 7.2 7.9 7.7 6.1 6.2  NEUTROABS 5.3 5.6 5.7 3.9 4.0  HGB 9.3* 9.3* 9.7* 9.3* 9.1*  HCT 31.3* 31.7* 32.8* 32.0* 31.0*  MCV 86.0 86.8 87.7 88.6 87.6  PLT 226 205 232 218 532   Basic  Metabolic Panel:  Recent Labs Lab 07/22/16 1147 07/22/16 1529 07/23/16 0653 07/24/16 0248 07/26/16 0235  NA 140 142 141 141 138  K 4.3 4.0 4.0 4.0 4.4  CL 102 103 103 104 101  CO2 32 33* 31 24 33*  GLUCOSE 139* 124* 101* 105* 116*  BUN '12 11 9 7 '$ 5*  CREATININE 1.07 1.05* 0.93 0.85 0.91  CALCIUM 9.5 9.4 9.0 8.6* 8.5*  MG  --   --  1.8  --   --    GFR: Estimated Creatinine Clearance: 58.9 mL/min (by C-G formula based on SCr of 0.91 mg/dL). Liver Function Tests:  Recent Labs Lab 07/22/16 1147 07/23/16 0653 07/24/16 0248  AST '18 20 20  '$ ALT 10 12* 12*  ALKPHOS 41 37* 42  BILITOT 0.3 0.3 0.1*  PROT 6.2 5.4* 5.3*  ALBUMIN 3.5 2.9* 2.8*   No results for input(s): LIPASE, AMYLASE in the last 168 hours. No results for input(s): AMMONIA in the last 168 hours. Coagulation Profile:  Recent Labs Lab 07/22/16 2028  INR 1.30   Cardiac Enzymes:  Recent Labs Lab 07/22/16 2028 07/23/16 0035 07/23/16 0653  TROPONINI 0.08* 0.09* 0.10*   BNP (last 3 results) No results for input(s): PROBNP in the last 8760 hours. HbA1C: No results for input(s): HGBA1C in the last 72 hours. CBG: No results for input(s): GLUCAP in the last 168 hours. Lipid Profile: No results for input(s): CHOL, HDL, LDLCALC, TRIG, CHOLHDL, LDLDIRECT in the last 72 hours. Thyroid Function Tests: No results for input(s): TSH, T4TOTAL, FREET4, T3FREE, THYROIDAB in the last 72 hours. Anemia Panel: No results for input(s): VITAMINB12, FOLATE, FERRITIN, TIBC, IRON, RETICCTPCT in the last 72 hours. Sepsis Labs: No results for input(s): PROCALCITON, LATICACIDVEN in the last 168 hours.  No results found for this or any previous visit (from the past 240 hour(s)).    Radiology Studies: No results found.   Scheduled Meds: . atorvastatin  20 mg Oral Daily  . feeding supplement (ENSURE ENLIVE)  237 mL Oral BID BM  . ferrous sulfate  325 mg Oral TID WC  . fluticasone  1 spray Each Nare Daily  . folic acid  1  mg Oral Daily  . metoprolol tartrate  100 mg Oral BID  . montelukast  10 mg Oral Daily  . pantoprazole  40 mg Oral Daily  . PARoxetine  20 mg Oral Daily  . polyethylene glycol  17 g Oral Daily  . senna-docusate  1 tablet Oral BID  . vitamin B-12  1,000 mcg Oral Daily   Continuous Infusions: . heparin 1,300 Units/hr (07/28/16 0555)     LOS: 5 days    Chipper Oman, MD [facility] Pager [phone]  If 7PM-7AM, please contact night-coverage [email or web address] Password [email or web address] 07/28/2016, 1:15 PM

## 2016-07-28 NOTE — Progress Notes (Signed)
HEMATOLOGY AND ONCOLOGY  Patient was sleeping when I arrived at the room. There is no family to discuss. I left the copies of the CT scans as well as the current pathology report from our hospital, with the nurse to be given to the family for their records.  Vitals:   07/27/16 2106 07/28/16 0700  BP: 110/60 102/60  Pulse: 100 80  Resp: 18 16  Temp: 98.7 F (37.1 C) 98.1 F (36.7 C)   CBC    Component Value Date/Time   WBC 6.2 07/28/2016 0246   RBC 3.54 (L) 07/28/2016 0246   HGB 9.1 (L) 07/28/2016 0246   HCT 31.0 (L) 07/28/2016 0246   PLT 214 07/28/2016 0246   MCV 87.6 07/28/2016 0246   MCH 25.7 (L) 07/28/2016 0246   MCHC 29.4 (L) 07/28/2016 0246   RDW 14.3 07/28/2016 0246   LYMPHSABS 1.3 07/28/2016 0246   MONOABS 0.7 07/28/2016 0246   EOSABS 0.2 07/28/2016 0246   BASOSABS 0.0 07/28/2016 0246   CMP Latest Ref Rng & Units 07/26/2016 07/24/2016 07/23/2016  Glucose 65 - 99 mg/dL 116(H) 105(H) 101(H)  BUN 6 - 20 mg/dL 5(L) 7 9  Creatinine 0.44 - 1.00 mg/dL 0.91 0.85 0.93  Sodium 135 - 145 mmol/L 138 141 141  Potassium 3.5 - 5.1 mmol/L 4.4 4.0 4.0  Chloride 101 - 111 mmol/L 101 104 103  CO2 22 - 32 mmol/L 33(H) 24 31  Calcium 8.9 - 10.3 mg/dL 8.5(L) 8.6(L) 9.0  Total Protein 6.5 - 8.1 g/dL - 5.3(L) 5.4(L)  Total Bilirubin 0.3 - 1.2 mg/dL - 0.1(L) 0.3  Alkaline Phos 38 - 126 U/L - 42 37(L)  AST 15 - 41 U/L - 20 20  ALT 14 - 54 U/L - 12(L) 12(L)   I reviewed the CT scans obtained from [place]. Patient has had chronic lung nodules including subpleural nodules for the past several years. Scans were done in 2015 and 2016. It is difficult to compare the reports because the sinuses were not mentioned in the prior reports. It is possible that these nodules are slightly bigger on the most current CT scan that we have here. It still does not answer the question whether these nodules of metastatic disease or evidence of prior carcinoid of the lung  We did not get the copy of the  pathology report from the lung nodules which apparently revealed that this was a carcinoid. It is unclear whether this is a well differentiated to poorly differentiated carcinoid.  Plan: 1. Proceed with the current treatment plan of diverticulitis colostomy followed by radiation to the anus in [place] 2. please arrange consultation at [facility] with the medical oncology team and radiation oncology as well upon discharge.  Medical oncology will be available for any further discussions or questions.

## 2016-07-29 ENCOUNTER — Inpatient Hospital Stay (HOSPITAL_COMMUNITY): Payer: Medicare Other | Admitting: Anesthesiology

## 2016-07-29 ENCOUNTER — Encounter (HOSPITAL_COMMUNITY): Payer: Self-pay | Admitting: Anesthesiology

## 2016-07-29 ENCOUNTER — Encounter (HOSPITAL_COMMUNITY)
Admission: EM | Disposition: A | Discharge status: Skilled Nursing Facility | Payer: Self-pay | Source: Home / Self Care | Attending: Family Medicine

## 2016-07-29 ENCOUNTER — Ambulatory Visit: Payer: Self-pay | Admitting: Surgery

## 2016-07-29 DIAGNOSIS — I480 Paroxysmal atrial fibrillation: Secondary | ICD-10-CM

## 2016-07-29 HISTORY — PX: LAPAROSCOPIC DIVERTED COLOSTOMY: SHX5892

## 2016-07-29 HISTORY — PX: COLOSTOMY: SHX63

## 2016-07-29 LAB — HEPARIN LEVEL (UNFRACTIONATED): Heparin Unfractionated: 0.5 IU/mL (ref 0.30–0.70)

## 2016-07-29 LAB — CBC WITH DIFFERENTIAL/PLATELET
BASOS ABS: 0 10*3/uL (ref 0.0–0.1)
BASOS PCT: 0 %
EOS PCT: 4 %
Eosinophils Absolute: 0.3 10*3/uL (ref 0.0–0.7)
HCT: 30.7 % — ABNORMAL LOW (ref 36.0–46.0)
Hemoglobin: 9 g/dL — ABNORMAL LOW (ref 12.0–15.0)
Lymphocytes Relative: 21 %
Lymphs Abs: 1.4 10*3/uL (ref 0.7–4.0)
MCH: 25.6 pg — ABNORMAL LOW (ref 26.0–34.0)
MCHC: 29.3 g/dL — ABNORMAL LOW (ref 30.0–36.0)
MCV: 87.2 fL (ref 78.0–100.0)
MONO ABS: 0.7 10*3/uL (ref 0.1–1.0)
Monocytes Relative: 11 %
Neutro Abs: 4.5 10*3/uL (ref 1.7–7.7)
Neutrophils Relative %: 64 %
PLATELETS: 207 10*3/uL (ref 150–400)
RBC: 3.52 MIL/uL — ABNORMAL LOW (ref 3.87–5.11)
RDW: 14.3 % (ref 11.5–15.5)
WBC: 7 10*3/uL (ref 4.0–10.5)

## 2016-07-29 SURGERY — CREATION, COLOSTOMY, DIVERTING, LAPAROSCOPIC
Anesthesia: General | Site: Abdomen

## 2016-07-29 MED ORDER — GENTAMICIN SULFATE 40 MG/ML IJ SOLN
5.0000 mg/kg | INTRAVENOUS | Status: AC
  Administered 2016-07-29: 569.2 mg via INTRAVENOUS
  Filled 2016-07-29 (×3): qty 14.25

## 2016-07-29 MED ORDER — BUPIVACAINE-EPINEPHRINE (PF) 0.25% -1:200000 IJ SOLN
INTRAMUSCULAR | Status: AC
  Filled 2016-07-29: qty 30

## 2016-07-29 MED ORDER — PROPOFOL 10 MG/ML IV BOLUS
INTRAVENOUS | Status: AC
  Filled 2016-07-29: qty 20

## 2016-07-29 MED ORDER — LIDOCAINE 2% (20 MG/ML) 5 ML SYRINGE
INTRAMUSCULAR | Status: AC
  Filled 2016-07-29: qty 5

## 2016-07-29 MED ORDER — FENTANYL CITRATE (PF) 100 MCG/2ML IJ SOLN
INTRAMUSCULAR | Status: DC | PRN
  Administered 2016-07-29: 50 ug via INTRAVENOUS
  Administered 2016-07-29: 100 ug via INTRAVENOUS
  Administered 2016-07-29 (×2): 50 ug via INTRAVENOUS

## 2016-07-29 MED ORDER — BUPIVACAINE-EPINEPHRINE 0.25% -1:200000 IJ SOLN
INTRAMUSCULAR | Status: DC | PRN
  Administered 2016-07-29: 6 mL

## 2016-07-29 MED ORDER — FENTANYL CITRATE (PF) 100 MCG/2ML IJ SOLN
INTRAMUSCULAR | Status: AC
Start: 2016-07-29 — End: 2016-07-29
  Filled 2016-07-29: qty 2

## 2016-07-29 MED ORDER — PHENYLEPHRINE HCL 10 MG/ML IJ SOLN
INTRAVENOUS | Status: DC | PRN
  Administered 2016-07-29: 50 ug/min via INTRAVENOUS

## 2016-07-29 MED ORDER — ROCURONIUM BROMIDE 50 MG/5ML IV SOSY
PREFILLED_SYRINGE | INTRAVENOUS | Status: AC
  Filled 2016-07-29: qty 5

## 2016-07-29 MED ORDER — PROPOFOL 10 MG/ML IV BOLUS
INTRAVENOUS | Status: DC | PRN
  Administered 2016-07-29: 170 mg via INTRAVENOUS

## 2016-07-29 MED ORDER — CLINDAMYCIN PHOSPHATE 900 MG/50ML IV SOLN
900.0000 mg | INTRAVENOUS | Status: AC
  Administered 2016-07-29: 900 mg via INTRAVENOUS
  Filled 2016-07-29 (×2): qty 50

## 2016-07-29 MED ORDER — DEXAMETHASONE SODIUM PHOSPHATE 10 MG/ML IJ SOLN
INTRAMUSCULAR | Status: DC | PRN
Start: 2016-07-29 — End: 2016-07-29
  Administered 2016-07-29: 10 mg via INTRAVENOUS

## 2016-07-29 MED ORDER — ONDANSETRON HCL 4 MG/2ML IJ SOLN
INTRAMUSCULAR | Status: DC | PRN
  Administered 2016-07-29: 4 mg via INTRAVENOUS

## 2016-07-29 MED ORDER — DOCUSATE SODIUM 100 MG PO CAPS
100.0000 mg | ORAL_CAPSULE | Freq: Two times a day (BID) | ORAL | Status: DC
  Administered 2016-07-29 – 2016-08-01 (×6): 100 mg via ORAL
  Filled 2016-07-29 (×7): qty 1

## 2016-07-29 MED ORDER — SUCCINYLCHOLINE CHLORIDE 20 MG/ML IJ SOLN
INTRAMUSCULAR | Status: DC | PRN
  Administered 2016-07-29: 120 mg via INTRAVENOUS

## 2016-07-29 MED ORDER — MIDAZOLAM HCL 2 MG/2ML IJ SOLN
INTRAMUSCULAR | Status: AC
  Filled 2016-07-29: qty 2

## 2016-07-29 MED ORDER — MIDAZOLAM HCL 5 MG/5ML IJ SOLN
INTRAMUSCULAR | Status: DC | PRN
  Administered 2016-07-29: 1 mg via INTRAVENOUS

## 2016-07-29 MED ORDER — SUCCINYLCHOLINE CHLORIDE 200 MG/10ML IV SOSY
PREFILLED_SYRINGE | INTRAVENOUS | Status: AC
  Filled 2016-07-29: qty 10

## 2016-07-29 MED ORDER — ONDANSETRON HCL 4 MG/2ML IJ SOLN: 4.0000 mg | Freq: Four times a day (QID) | INTRAMUSCULAR | Status: DC | PRN

## 2016-07-29 MED ORDER — ALBUMIN HUMAN 5 % IV SOLN
INTRAVENOUS | Status: DC | PRN
  Administered 2016-07-29 (×2): via INTRAVENOUS

## 2016-07-29 MED ORDER — HYDROMORPHONE HCL 1 MG/ML IJ SOLN
0.5000 mg | INTRAMUSCULAR | Status: DC | PRN
  Administered 2016-07-29 – 2016-07-30 (×3): 0.5 mg via INTRAVENOUS
  Filled 2016-07-29 (×3): qty 1

## 2016-07-29 MED ORDER — ONDANSETRON HCL 4 MG/2ML IJ SOLN
INTRAMUSCULAR | Status: AC
  Filled 2016-07-29: qty 2

## 2016-07-29 MED ORDER — ROCURONIUM BROMIDE 100 MG/10ML IV SOLN
INTRAVENOUS | Status: DC | PRN
  Administered 2016-07-29 (×2): 10 mg via INTRAVENOUS
  Administered 2016-07-29: 50 mg via INTRAVENOUS

## 2016-07-29 MED ORDER — SUGAMMADEX SODIUM 200 MG/2ML IV SOLN
INTRAVENOUS | Status: DC | PRN
  Administered 2016-07-29: 300 mg via INTRAVENOUS

## 2016-07-29 MED ORDER — LACTATED RINGERS IV SOLN
INTRAVENOUS | Status: DC
  Administered 2016-07-29 – 2016-08-02 (×7): via INTRAVENOUS

## 2016-07-29 MED ORDER — 0.9 % SODIUM CHLORIDE (POUR BTL) OPTIME
TOPICAL | Status: DC | PRN
  Administered 2016-07-29: 1000 mL

## 2016-07-29 MED ORDER — CHLORHEXIDINE GLUCONATE CLOTH 2 % EX PADS: 6.0000 | MEDICATED_PAD | Freq: Once | CUTANEOUS | Status: DC

## 2016-07-29 MED ORDER — LIDOCAINE HCL (CARDIAC) 20 MG/ML IV SOLN
INTRAVENOUS | Status: DC | PRN
Start: 2016-07-29 — End: 2016-07-29
  Administered 2016-07-29: 80 mg via INTRAVENOUS

## 2016-07-29 MED ORDER — FENTANYL CITRATE (PF) 100 MCG/2ML IJ SOLN
INTRAMUSCULAR | Status: AC
  Filled 2016-07-29: qty 4

## 2016-07-29 MED ORDER — OXYCODONE HCL 5 MG PO TABS: 5.0000 mg | ORAL_TABLET | ORAL | Status: DC | PRN

## 2016-07-29 MED ORDER — SUGAMMADEX SODIUM 500 MG/5ML IV SOLN
INTRAVENOUS | Status: AC
  Filled 2016-07-29: qty 5

## 2016-07-29 SURGICAL SUPPLY — 41 items
BLADE SURG 10 STRL SS (BLADE) ×4 IMPLANT
CANISTER SUCTION 2500CC (MISCELLANEOUS) ×4 IMPLANT
CHLORAPREP W/TINT 26ML (MISCELLANEOUS) ×4 IMPLANT
COVER SURGICAL LIGHT HANDLE (MISCELLANEOUS) ×4 IMPLANT
DRAPE UTILITY XL STRL (DRAPES) ×4 IMPLANT
ELECT BLADE 6.5 EXT (BLADE) ×4 IMPLANT
ELECT CAUTERY BLADE 6.4 (BLADE) ×4 IMPLANT
ELECT REM PT RETURN 9FT ADLT (ELECTROSURGICAL) ×4
ELECTRODE REM PT RTRN 9FT ADLT (ELECTROSURGICAL) ×2 IMPLANT
GOWN STRL REUS W/ TWL LRG LVL3 (GOWN DISPOSABLE) ×10 IMPLANT
GOWN STRL REUS W/TWL LRG LVL3 (GOWN DISPOSABLE) ×10
KIT BASIN OR (CUSTOM PROCEDURE TRAY) ×4 IMPLANT
KIT PREVENA INCISION MGT 13 (CANNISTER) ×4 IMPLANT
KIT ROOM TURNOVER OR (KITS) ×4 IMPLANT
LIGASURE IMPACT 36 18CM CVD LR (INSTRUMENTS) ×4 IMPLANT
NS IRRIG 1000ML POUR BTL (IV SOLUTION) ×4 IMPLANT
PAD ARMBOARD 7.5X6 YLW CONV (MISCELLANEOUS) ×8 IMPLANT
PENCIL BUTTON HOLSTER BLD 10FT (ELECTRODE) ×4 IMPLANT
SCISSORS LAP 5X35 DISP (ENDOMECHANICALS) ×4 IMPLANT
SET IRRIG TUBING LAPAROSCOPIC (IRRIGATION / IRRIGATOR) IMPLANT
SLEEVE ENDOPATH XCEL 5M (ENDOMECHANICALS) ×16 IMPLANT
STAPLER PROXIMATE 75MM BLUE (STAPLE) ×4 IMPLANT
STAPLER VISISTAT 35W (STAPLE) ×8 IMPLANT
SUCTION POOLE TIP (SUCTIONS) ×4 IMPLANT
SUT PDS AB 1 TP1 96 (SUTURE) ×8 IMPLANT
SUT SILK 2 0 SH CR/8 (SUTURE) ×4 IMPLANT
SUT SILK 2 0 TIES 10X30 (SUTURE) IMPLANT
SUT SILK 3 0 SH CR/8 (SUTURE) ×4 IMPLANT
SUT SILK 3 0 TIES 10X30 (SUTURE) ×4 IMPLANT
SYR BULB IRRIGATION 50ML (SYRINGE) ×4 IMPLANT
TOWEL OR 17X26 10 PK STRL BLUE (TOWEL DISPOSABLE) ×4 IMPLANT
TRAY FOLEY CATH 16FRSI W/METER (SET/KITS/TRAYS/PACK) ×4 IMPLANT
TRAY LAPAROSCOPIC MC (CUSTOM PROCEDURE TRAY) ×4 IMPLANT
TROCAR XCEL 12X100 BLDLESS (ENDOMECHANICALS) IMPLANT
TROCAR XCEL BLUNT TIP 100MML (ENDOMECHANICALS) IMPLANT
TROCAR XCEL NON-BLD 11X100MML (ENDOMECHANICALS) IMPLANT
TROCAR XCEL NON-BLD 5MMX100MML (ENDOMECHANICALS) ×4 IMPLANT
TUBE CONNECTING 12'X1/4 (SUCTIONS) ×1
TUBE CONNECTING 12X1/4 (SUCTIONS) ×3 IMPLANT
TUBING FILTER THERMOFLATOR (ELECTROSURGICAL) ×4 IMPLANT
YANKAUER SUCT BULB TIP NO VENT (SUCTIONS) ×4 IMPLANT

## 2016-07-29 NOTE — Anesthesia Procedure Notes (Signed)
Procedure Name: Intubation Date/Time: 07/29/2016 2:25 PM Performed by: Rebekah Chesterfield L Pre-anesthesia Checklist: Patient identified, Emergency Drugs available, Suction available and Patient being monitored Patient Re-evaluated:Patient Re-evaluated prior to inductionOxygen Delivery Method: Circle System Utilized Preoxygenation: Pre-oxygenation with 100% oxygen Intubation Type: IV induction Ventilation: Mask ventilation without difficulty Laryngoscope Size: Mac and 3 Grade View: Grade I Tube type: Oral Tube size: 7.5 mm Number of attempts: 1 Airway Equipment and Method: Stylet Placement Confirmation: ETT inserted through vocal cords under direct vision,  positive ETCO2 and breath sounds checked- equal and bilateral Secured at: 21 cm Tube secured with: Tape Dental Injury: Teeth and Oropharynx as per pre-operative assessment

## 2016-07-29 NOTE — Progress Notes (Signed)
ANTICOAGULATION CONSULT NOTE - Follow Up Consult  Pharmacy Consult for heparin Indication: Hx of PE and Afib  Allergies  Allergen Reactions  . Aleve [Naproxen Sodium] Hives  . Antihistamines, Chlorpheniramine-Type     ANTIHISTAMINES  . Aspirin   . Ciprofloxacin   . Codeine   . Penicillins     Has patient had a PCN reaction causing immediate rash, facial/tongue/throat swelling, SOB or lightheadedness with hypotension: Yes Has patient had a PCN reaction causing severe rash involving mucus membranes or skin necrosis: No Has patient had a PCN reaction that required hospitalization No Has patient had a PCN reaction occurring within the last 10 years: No If all of the above answers are "NO", then may proceed with Cephalosporin use.   . Rocephin [Ceftriaxone Sodium In Dextrose]   . Sulfa Antibiotics   . Hydrochlorothiazide Rash    Patient Measurements: Height: '5\' 4"'$  (162.6 cm) Weight: 250 lb 14.1 oz (113.8 kg) IBW/kg (Calculated) : 54.7 Heparin Dosing Weight: 81 kg  Vital Signs: Temp: 98.4 F (36.9 C) (12/18 0556) Temp Source: Oral (12/18 0556) BP: 122/49 (12/18 0556) Pulse Rate: 90 (12/18 0556)  Labs:  Recent Labs  07/27/16 0353  07/28/16 0246 07/28/16 1244 07/29/16 0216  HGB 9.3*  --  9.1*  --  9.0*  HCT 32.0*  --  31.0*  --  30.7*  PLT 218  --  214  --  207  HEPARINUNFRC 0.59  < > 0.71* 0.64 0.50  < > = values in this interval not displayed.  Estimated Creatinine Clearance: 57.9 mL/min (by C-G formula based on SCr of 0.91 mg/dL).  Assessment: 82 year old female admitted 07/22/2016 on Eliquis PTA for history of PE and Afib (Last dose PTA 12/11). Pharmacy consulted to dose heparin while Eliquis on hold for diverting colostomy today.   Heparin level 0.5 (therapeutic), Hgb 9 stable, Plt wnl. No infusion issues or bleeding reported.  Goal of Therapy:  Heparin level 0.3-0.7 units/ml Monitor platelets by anticoagulation protocol: Yes   Plan:  - Continue heparin  1300 units/hr - Monitor daily heparin level, CBC and s/sx of bleeding - Will f/u plans for anticoagulation after surgery  Maryanna Shape, PharmD, BCPS  Clinical Pharmacist  Pager: [phone]   07/29/2016 8:24 AM

## 2016-07-29 NOTE — Consult Note (Signed)
[place]         [phone] ________________________________  Initial inpatient Consultation  Name: Tricia Hernandez MRN: [id number]  Date: 07/22/2016  DOB: 26-Jan-1931  REFERRING PHYSICIAN: Dr. 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 female who is   who presented with two-week history of rectal bleeding. She was referred to gastroenterology as an outpatient and was subsequently referred to general surgery. She was admitted to the hospital on 07/22/2016. Dr. Hulen Skains examined her in the operating room and found a left sided fissure with associated friable mucosa and a large left-sided subcutaneous mass. Biopsy of this mass revealed squamous cell carcinoma. CT of the abdomen and pelvis revealed bilateral inguinal lymphadenopathy. CT of the chest revealed bilateral lung nodules of unclear significance. These could be metastatic disease. She had diverting colostomy earlier today.  PREVIOUS RADIATION THERAPY: No  Past Medical History:  Diagnosis Date  . Anxiety   . Atrial fibrillation ([place])   . Chronic bronchitis ([place])   . Chronic depression   . Diverticular disease   . Fibrocystic disease of breast   . Fracture of shoulder   . GERD (gastroesophageal reflux disease)   . Herpes zoster   . Hyperlipidemia   . Hypertension   . Labyrinthitis   . Lung cancer ([place])   . Obesity   . Paroxysmal supraventricular tachycardia ([place])   . Pulmonary embolus ([place])   . Vitamin D deficiency   :   Past Surgical History:  Procedure Laterality Date  . APPENDECTOMY    . BASAL CELL CARCINOMA EXCISION     nose  . breast cyst removal     right  . CATARACT EXTRACTION Bilateral   . CHOLECYSTECTOMY    . EVALUATION UNDER ANESTHESIA WITH HEMORRHOIDECTOMY AND PROCTOSCOPY N/A 07/25/2016   Procedure: EXAM UNDER ANESTHESIA, EXCISION PERIANAL MASS.;  Surgeon: Judeth Horn, MD;  Location: [place];  Service: General;  Laterality: N/A;  Prone position  . LUNG REMOVAL, PARTIAL  2013   RML mass/carcinoid, Dr Lurena Nida  . TONSILLECTOMY AND ADENOIDECTOMY    . TOTAL VAGINAL HYSTERECTOMY    :   Current Facility-Administered Medications:  .  acetaminophen (TYLENOL) tablet 650 mg, 650 mg, Oral, Q6H PRN, 650 mg at 07/24/16 1323 **OR** [DISCONTINUED] acetaminophen (TYLENOL) suppository 650 mg, 650 mg, Rectal, Q6H PRN, Lavina Hamman, MD .  albuterol (PROVENTIL) (2.5 MG/3ML) 0.083% nebulizer solution 2.5 mg, 2.5 mg, Nebulization, Q6H PRN, Lavina Hamman, MD .  atorvastatin (LIPITOR) tablet 20 mg, 20 mg, Oral, Daily, Lavina Hamman, MD, 20 mg at 07/28/16 1036 .  docusate sodium (COLACE) capsule 100 mg, 100 mg, Oral, BID, Clovis Riley, MD .  feeding supplement (ENSURE ENLIVE) (ENSURE ENLIVE) liquid 237 mL, 237 mL, Oral, BID BM, Pershing Proud, NP, 034 mL at 07/28/16 1400 .  ferrous sulfate tablet 325 mg, 325 mg, Oral, TID WC, Lavina Hamman, MD, 325 mg at 07/28/16 1716 .  fluticasone (FLONASE) 50 MCG/ACT nasal spray 1 spray, 1 spray, Each Nare, Daily, Lavina Hamman, MD, 1 spray at 07/28/16 1033 .  folic acid (FOLVITE) tablet 1 mg, 1 mg, Oral, Daily, Lavina Hamman, MD, 1 mg at 07/28/16 1036 .  heparin ADULT infusion 100 units/mL (25000 units/262m sodium chloride 0.45%), 1,300 Units/hr, Intravenous, Continuous, Veronda  Rolly Salter, RPH, Stopped at 07/29/16 1749 .  HYDROcodone-acetaminophen (NORCO/VICODIN) 5-325 MG per tablet 1-2 tablet, 1-2 tablet, Oral, Q4H PRN, Rhetta Mura Schorr, NP, 1 tablet at 07/29/16 0946 .  HYDROmorphone (DILAUDID) injection 0.5 mg, 0.5 mg, Intravenous, Q2H PRN, Clovis Riley, MD, 0.5 mg at 07/29/16 1851 .  lactated ringers infusion, , Intravenous, Continuous, Belinda Block, MD, Last Rate: 50 mL/hr at 07/29/16 1307 .  lip balm (BLISTEX) ointment, , Topical, PRN, Lavina Hamman, MD, 1 application at 44/96/75 1020 .  LORazepam (ATIVAN) tablet 0.5 mg, 0.5 mg, Oral, Q8H PRN, Lavina Hamman, MD, 0.5 mg at 07/28/16 1816 .  metoprolol (LOPRESSOR) tablet 100 mg, 100 mg, Oral, BID, Doreatha Lew, MD, 100 mg at 07/29/16 1046 .  montelukast (SINGULAIR) tablet 10 mg, 10 mg, Oral, Daily, Lavina Hamman, MD, 10 mg at 07/29/16 1046 .  ondansetron (ZOFRAN) tablet 4 mg, 4 mg, Oral, Q6H PRN **OR** ondansetron (ZOFRAN) injection 4 mg, 4 mg, Intravenous, Q6H PRN, Lavina Hamman, MD, 4 mg at 07/25/16 1654 .  ondansetron (ZOFRAN) injection 4 mg, 4 mg, Intravenous, Q6H PRN, Clovis Riley, MD .  oxyCODONE (Oxy IR/ROXICODONE) immediate release tablet 5 mg, 5 mg, Oral, Q3H PRN, Clovis Riley, MD .  pantoprazole (PROTONIX) EC tablet 40 mg, 40 mg, Oral, Daily, Lavina Hamman, MD, 40 mg at 07/28/16 1037 .  PARoxetine (PAXIL) tablet 20 mg, 20 mg, Oral, Daily, Lavina Hamman, MD, 20 mg at 07/28/16 2138 .  polyethylene glycol (MIRALAX / GLYCOLAX) packet 17 g, 17 g, Oral, Daily, Lavina Hamman, MD .  senna-docusate (Senokot-S) tablet 1 tablet, 1 tablet, Oral, BID, Lavina Hamman, MD, 1 tablet at 07/26/16 2113 .  vitamin B-12 (CYANOCOBALAMIN) tablet 1,000 mcg, 1,000 mcg, Oral, Daily, Lavina Hamman, MD, 1,000 mcg at 07/28/16 1035:   Allergies  Allergen Reactions  . Aleve [Naproxen Sodium] Hives  . Antihistamines, Chlorpheniramine-Type     ANTIHISTAMINES  . Aspirin   . Ciprofloxacin   . Codeine   . Penicillins     Has patient had a PCN reaction causing immediate rash, facial/tongue/throat swelling, SOB or lightheadedness with hypotension: Yes Has patient had a PCN reaction causing severe rash involving mucus membranes or skin necrosis: No Has patient had a PCN reaction that required hospitalization No Has patient had a PCN reaction occurring within the last 10 years: No If all of the above answers are "NO", then may proceed with Cephalosporin use.   . Rocephin [Ceftriaxone  Sodium In Dextrose]   . Sulfa Antibiotics   . Hydrochlorothiazide Rash  :   Family History  Problem Relation Age of Onset  . Heart attack Mother     Dec 1987  . CVA Mother   . Hypertension Mother   . Multiple myeloma Mother   . Breast cancer Sister   . Skin cancer Sister   . Prostate cancer Brother   . Throat cancer Brother   . Cervical cancer Daughter   . Leukemia Daughter   . Leukemia Son   . Throat cancer Brother   . Cancer Brother     soft palette  . Colon cancer Neg Hx   . Pancreatic cancer Neg Hx   :   Social History   Social History  . Marital status: Single    Spouse name: N/A  . Number of children: N/A  . Years of education: N/A   Occupational History  . Not on file.   Social  History Main Topics  . Smoking status: Former Smoker    Quit date: 1976  . Smokeless tobacco: Never Used  . Alcohol use No  . Drug use: No  . Sexual activity: Not on file   Other Topics Concern  . Not on file   Social History Narrative  . No narrative on file  :  REVIEW OF SYSTEMS:  A 15 point review of systems is documented in the electronic medical record. This was obtained by the nursing staff. However, I reviewed this with the patient to discuss relevant findings and make appropriate changes.  Pertinent items are noted in HPI.   PHYSICAL EXAM:  Blood pressure (!) 143/79, pulse 100, temperature 98.4 F (36.9 C), resp. rate (!) 24, height _0  (1.626 m), weight 250 lb 14.1 oz (113.8 kg), SpO2 100 %. per med-onc GENERAL:alert, no distress and comfortable SKIN: skin color, texture, turgor are normal, no rashes or significant lesions EYES: normal, conjunctiva are pink and non-injected, sclera clear OROPHARYNX:no exudate, no erythema and lips, buccal mucosa, and tongue normal  NECK: supple, thyroid normal size, non-tender, without nodularity LYMPH:  no palpable lymphadenopathy in the cervical, axillary or inguinal LUNGS: clear to auscultation And diminished breath sounds  at the bases HEART: Occasionally irregular ABDOMEN:abdomen soft, non-tender and normal bowel sounds Musculoskeletal:no cyanosis of digits and no clubbing  PSYCH: alert & oriented x 3 with fluent speech NEURO: no focal motor/sensory deficits  KPS = 30  100 - Normal; no complaints; no evidence of disease. 90   - Able to carry on normal activity; minor signs or symptoms of disease. 80   - Normal activity with effort; some signs or symptoms of disease. 44   - Cares for self; unable to carry on normal activity or to do active work. 60   - Requires occasional assistance, but is able to care for most of his personal needs. 50   - Requires considerable assistance and frequent medical care. 62   - Disabled; requires special care and assistance. 60   - Severely disabled; hospital admission is indicated although death not imminent. 58   - Very sick; hospital admission necessary; active supportive treatment necessary. 10   - Moribund; fatal processes progressing rapidly. 0     - Dead  Karnofsky DA, Abelmann [place], Craver LS and Burchenal [facility] [phone]) The use of the nitrogen mustards in the palliative treatment of carcinoma: with particular reference to bronchogenic carcinoma Cancer 1 634-56  LABORATORY DATA:  Lab Results  Component Value Date   WBC 7.0 07/29/2016   HGB 9.0 (L) 07/29/2016   HCT 30.7 (L) 07/29/2016   MCV 87.2 07/29/2016   PLT 207 07/29/2016   Lab Results  Component Value Date   NA 138 07/26/2016   K 4.4 07/26/2016   CL 101 07/26/2016   CO2 33 (H) 07/26/2016   Lab Results  Component Value Date   ALT 12 (L) 07/24/2016   AST 20 07/24/2016   ALKPHOS 42 07/24/2016   BILITOT 0.1 (L) 07/24/2016     RADIOGRAPHY: Dg Chest 2 View  Result Date: 07/22/2016 CLINICAL DATA:  Shortness of breath and anemia EXAM: CHEST  2 VIEW COMPARISON:  None. FINDINGS: There is mild bilateral interstitial thickening likely chronic. Mild area of linear airspace disease in the right mid lung likely  reflecting scarring. 8 mm right lower lobe pulmonary nodule. 7 mm right upper lobe pulmonary nodule. There is no pleural effusion or pneumothorax. The heart and mediastinal contours are unremarkable. The  osseous structures are unremarkable. IMPRESSION: No active cardiopulmonary disease. 8 mm right lower lobe and mm right upper lobe pulmonary nodule. Recommend further evaluation with a nonemergent CT of the chest. Electronically Signed   By: Kathreen Devoid   On: 07/22/2016 16:56   Ct Chest Wo Contrast  Result Date: 07/25/2016 CLINICAL DATA:  Patient with history of lung cancer and right lung surgery. Newly diagnosed rectal mass. EXAM: CT CHEST WITHOUT CONTRAST TECHNIQUE: Multidetector CT imaging of the chest was performed following the standard protocol without IV contrast. COMPARISON:  CT abdomen pelvis 07/23/2016. FINDINGS: Cardiovascular: Calcified atherosclerotic plaque involving the thoracic aorta. Heart is enlarged. No pericardial effusion. Coronary arterial vascular calcifications. Mediastinum/Nodes: No enlarged axillary, mediastinal or hilar lymphadenopathy. Small hiatal hernia. Lungs/Pleura: Postsurgical changes within the right mid lung. There is peribronchial consolidation within the right middle lobe. Irregular nodules are demonstrated within the right upper lobe measuring 10 mm (image 52; series 205) and 14 mm (image 50; series 205). Cluster of nodules demonstrated within the right lower lobe measuring 2.1 x 1.8 cm in total (image 69; series 205). 4 mm subpleural left lower lobe nodule (image 88; series 205). 3 mm right lower lobe nodule (image 91; series 205). 3 mm left lower lobe nodule (image 64; series 205). 4 mm left lower lobe nodule (image 60; series 205). 10 mm ground-glass nodule within the peripheral left upper lobe (image 43; series 205). Additional scattered 2-3 mm nodules within the left upper and left lower lobes. No pleural effusion or pneumothorax. Upper Abdomen: Liver is normal in  size and contour. The adrenal glands are unremarkable. Musculoskeletal: Thoracic spine degenerative changes. No aggressive or acute appearing osseous lesions. IMPRESSION: Postsurgical changes within the right hemithorax compatible patient's history of lung malignancy. No priors are available for comparison. There are adjacent irregular nodules within the right upper lobe and cluster of nodules within the right lower lobe which are nonspecific in etiology given lack of priors for comparison. Primary pulmonary malignancy, metastatic disease or infectious/inflammatory processes are considered. Multiple additional smaller bilateral pulmonary nodules and focus of ground-glass opacities are demonstrated. Recommend comparison with outside prior examinations to assess for interval change size stability. Aortic atherosclerosis. Electronically Signed   By: Lovey Newcomer M.D.   On: 07/25/2016 20:27   Ct Abdomen Pelvis W Contrast  Result Date: 07/23/2016 CLINICAL DATA:  Poor historian bright red blood per rectum anemia and rectal mass EXAM: CT ABDOMEN AND PELVIS WITH CONTRAST TECHNIQUE: Multidetector CT imaging of the abdomen and pelvis was performed using the standard protocol following bolus administration of intravenous contrast. CONTRAST:  19m ISOVUE-300 IOPAMIDOL (ISOVUE-300) INJECTION 61% COMPARISON:  None. FINDINGS: Lower chest: Linear scar or atelectasis within the lingula and left lower lobe posteriorly. Lobulated subpleural 1.4 cm density right lung base is contiguous with a more focal nodular density measuring 8.5 mm. No effusion or consolidation. Borderline heart size. No pericardial effusion. Coronary artery calcifications. Hepatobiliary: No focal hepatic abnormality. Gallbladder not well identified and is presumably surgically absent. No biliary dilatation. Pancreas: Unremarkable. No pancreatic ductal dilatation or surrounding inflammatory changes. Spleen: Normal in size without focal abnormality.  Adrenals/Urinary Tract: Adrenal glands are within normal limits. Left kidney unremarkable. Cortical irregularity right kidney could relate to focal scarring. Mildly enlarged right extrarenal pelvis with prominent right ureter. No calcified stones seen along the course of the right ureter. Bladder is unremarkable. Stomach/Bowel: The stomach is nonenlarged. There is no dilated small bowel. There is diverticular disease of the colon. There is irregular posterior  wall thickening of the rectum, this is contiguous with a partially visualized soft tissue mass which measures 5.1 by 5.7 cm. Vascular/Lymphatic: Atherosclerosis of the aorta. No enlarged retroperitoneal nodes. There are multiple abnormally enlarged inguinal lymph nodes, on the right this measures 3.7 by 2.3 cm and on the left, this measures 4.3 by 2.2 cm. Reproductive: Status post hysterectomy. No adnexal masses. Other: No free air or free fluid. Musculoskeletal: Degenerative changes of the spine. No suspicious bone lesions. IMPRESSION: 1. Irregular posterior rectal wall thickening which is contiguous with a partially visualized posterior rectal/anal soft tissue mass measuring at least 5.7 cm. 2. Multiple abnormally enlarged bilateral inguinal lymph nodes as described above. Metastatic disease cannot be excluded. 3. Colon diverticular disease without acute inflammation 4. Probable scarring involving right kidney. Enlarged right extrarenal pelvis and prominent right ureter without calcified stone along the course of the right ureter. 5. Right lower lobe nodular density measuring 8.5 mm. Consider dedicated chest CT for further evaluation. Electronically Signed   By: Donavan Foil M.D.   On: 07/23/2016 16:05      IMPRESSION: 82 yo woman with stage T3 N3 MX (possible lung metastases) squamous cell carcinoma of the anal canal.  Her stage is significantly affected by the status of her lung nodules.  If these are metastases, then she is stage IV and may be eligible  for palliative radiotherapy to the pelvis to help provide hemostasis.  If the lung nodules are determined to not be metastases, then, she has stage IIIB disease with bilateral inguinal adenopathy.  Stage IIIB disease is treatable and potentially for durable control/cure.  However, the standard treatment for locally advanced anal cancer represents a relatively toxic regimen of concurrent chemoradiotherapy, which the patient may not tolerate.  For these reasons, the current approach of stabilizing bleeding, diverting colostomy and supportive care are warranted.  Upon recovery from surgery, as outpatient, she can be reassessed for treatment options.  If her performance status does not rebound and continues to decline, a palliative approach with or without short-course pelvic radiotherapy may be appropriate without further imaging.  If her her performance status improves, and she able to be discharged, outpatient PET-CT may be warranted to further characterize the lung nodules.  With all of this, it is my understanding that the patient wishes to pursue care in [place].  PLAN:  I am happy to assist in whatever way possible.  I agree with current effort to stabilize patient for discharge home, with home health services.  She should potentially establish care with the cancer center at [facility] for consultation on return home.       I spent 40 minutes minutes face to face with the patient and more than 50% of that time was spent in counseling and/or coordination of care.   ------------------------------------------------   Tyler Pita, MD [place] Director and Director of Stereotactic Radiosurgery Direct Dial: [phone]  Fax: [phone] [email or web address]  Skype  [email or web address]

## 2016-07-29 NOTE — Progress Notes (Signed)
PROGRESS NOTE [facility]   Arlita Buffkin   QAS:[id number] DOB: [birth year]  DOA: 07/22/2016 PCP: Clinton Quant, MD   Brief Narrative:  82 year old female with past medical history of hypertension, hyperlipidemia, PSVT, PE, PAF on Eliquis presented to the emergency room with complaining of shortness of breath and fatigue and BRBPR with rectal mass. Patient underwent on excisional rectal biopsy which revealed invasive anal squamous cell CA. Oncology was consulted. Surgery planning for possible diverting colostomy on Monday. Patient need to be set up for radiation at [facility] where she live.   Subjective: Patient seen and examined at bedside with family . Has  Tenderness in her anal area    Assessment & Plan: Anal Squamous Cell Ca  Oncology consulted appreciated recommendations  Surgery plan for diverting colostomy  12/18,followed by radiation to the anus in [place] team consulted for support - appreciated  PET scan as outpatient  arrange consultation at [facility] with the medical oncology team and radiation oncology as well upon discharge   Bright red blood per rectum and rectal mass - s/p excisional biopsy - Resolved  Symptomatic anemia - H&H stable Continue MiraLAX Transfuse for hemoglobin less than 7.Now 9.0  Hypertension - stable  History of CHF Cardiology consulted for clearance for surgical procedure.  Echo showed EF of 60-65% no wall motion abnormality. Elevated troponin likely secondary to uncontrolled Afib vs demand ischemia  No sings of fluid overload  BP stable   History of PE. A. Fib. - continues to be on Afib but well controlled  CHA2DS2-VASc Score 5 Continue to hold Apixaban - heparin gtt in view of surgery Continue metoprolol      Pulmonary nodule. History of lung cancer. 105m nodule in the right lower lobe and 7 mm right upper lobe pulmonary nodule. Patient does have history of right sided lung cancer S/P surgical resection in the  past 2013. CT chest shows multiple lung nodules ? Metastasis vs residual from previous lung cancer. Is it carcinoid ?    DVT prophylaxis:  Heparin gtt  Code Status: Full Family Communication: Son at bedside Disposition Plan: Home when medically stable.  Consultants:   Cardiology   Gen Surgery   Oncology   Procedures:   ECHO  ------------------------------------------------------------------- Study Conclusions  - Left ventricle: The cavity size was normal. Wall thickness was   increased in a pattern of moderate LVH. Systolic function was   normal. The estimated ejection fraction was in the range of 60%   to 65%. Wall motion was normal; there were no regional wall   motion abnormalities.  Antimicrobials:  None    Objective: Vitals:   07/28/16 1955 07/29/16 0255 07/29/16 0556 07/29/16 0827  BP: 121/60  (!) 122/49   Pulse: 89  90 (!) 108  Resp: 18  18   Temp: 98.3 F (36.8 C)  98.4 F (36.9 C)   TempSrc: Oral  Oral   SpO2: 98%  99% 95%  Weight:  113.8 kg (250 lb 14.1 oz)    Height:        Intake/Output Summary (Last 24 hours) at 07/29/16 0903 Last data filed at 07/28/16 1230  Gross per 24 hour  Intake              240 ml  Output                0 ml  Net              240 ml   FAutoliv  07/27/16 0503 07/28/16 0700 07/29/16 0255  Weight: 111.7 kg (246 lb 4.1 oz) 117.1 kg (258 lb 2.5 oz) 113.8 kg (250 lb 14.1 oz)    Examination: - No changes in physical exam   General exam: NAD, hard hearing Respiratory system: CTA  Cardiovascular system: IRR IRR. No murmurs  Gastrointestinal system: Soft NTND GU: Incision about 3 cm, clean, package in place. Some serosanguinous drainage    Extremities: No edema  Data Reviewed: I have personally reviewed following labs and imaging studies  CBC:  Recent Labs Lab 07/25/16 0153 07/26/16 0235 07/27/16 0353 07/28/16 0246 07/29/16 0216  WBC 7.9 7.7 6.1 6.2 7.0  NEUTROABS 5.6 5.7 3.9 4.0 4.5  HGB 9.3* 9.7*  9.3* 9.1* 9.0*  HCT 31.7* 32.8* 32.0* 31.0* 30.7*  MCV 86.8 87.7 88.6 87.6 87.2  PLT 205 232 218 214 169   Basic Metabolic Panel:  Recent Labs Lab 07/22/16 1147 07/22/16 1529 07/23/16 0653 07/24/16 0248 07/26/16 0235  NA 140 142 141 141 138  K 4.3 4.0 4.0 4.0 4.4  CL 102 103 103 104 101  CO2 32 33* 31 24 33*  GLUCOSE 139* 124* 101* 105* 116*  BUN '12 11 9 7 '$ 5*  CREATININE 1.07 1.05* 0.93 0.85 0.91  CALCIUM 9.5 9.4 9.0 8.6* 8.5*  MG  --   --  1.8  --   --    GFR: Estimated Creatinine Clearance: 57.9 mL/min (by C-G formula based on SCr of 0.91 mg/dL). Liver Function Tests:  Recent Labs Lab 07/22/16 1147 07/23/16 0653 07/24/16 0248  AST '18 20 20  '$ ALT 10 12* 12*  ALKPHOS 41 37* 42  BILITOT 0.3 0.3 0.1*  PROT 6.2 5.4* 5.3*  ALBUMIN 3.5 2.9* 2.8*   No results for input(s): LIPASE, AMYLASE in the last 168 hours. No results for input(s): AMMONIA in the last 168 hours. Coagulation Profile:  Recent Labs Lab 07/22/16 2028  INR 1.30   Cardiac Enzymes:  Recent Labs Lab 07/22/16 2028 07/23/16 0035 07/23/16 0653  TROPONINI 0.08* 0.09* 0.10*   BNP (last 3 results) No results for input(s): PROBNP in the last 8760 hours. HbA1C: No results for input(s): HGBA1C in the last 72 hours. CBG: No results for input(s): GLUCAP in the last 168 hours. Lipid Profile: No results for input(s): CHOL, HDL, LDLCALC, TRIG, CHOLHDL, LDLDIRECT in the last 72 hours. Thyroid Function Tests: No results for input(s): TSH, T4TOTAL, FREET4, T3FREE, THYROIDAB in the last 72 hours. Anemia Panel: No results for input(s): VITAMINB12, FOLATE, FERRITIN, TIBC, IRON, RETICCTPCT in the last 72 hours. Sepsis Labs: No results for input(s): PROCALCITON, LATICACIDVEN in the last 168 hours.  No results found for this or any previous visit (from the past 240 hour(s)).    Radiology Studies: No results found.   Scheduled Meds: . atorvastatin  20 mg Oral Daily  . Chlorhexidine Gluconate Cloth  6  each Topical Once  . Chlorhexidine Gluconate Cloth  6 each Topical Once  . clindamycin (CLEOCIN) IV  900 mg Intravenous On Call to OR   And  . gentamicin  5 mg/kg Intravenous On Call to OR  . feeding supplement (ENSURE ENLIVE)  237 mL Oral BID BM  . ferrous sulfate  325 mg Oral TID WC  . fluticasone  1 spray Each Nare Daily  . folic acid  1 mg Oral Daily  . metoprolol tartrate  100 mg Oral BID  . montelukast  10 mg Oral Daily  . pantoprazole  40 mg Oral Daily  .  PARoxetine  20 mg Oral Daily  . polyethylene glycol  17 g Oral Daily  . senna-docusate  1 tablet Oral BID  . vitamin B-12  1,000 mcg Oral Daily   Continuous Infusions: . heparin 1,300 Units/hr (07/28/16 0555)     LOS: 6 days    Reyne Dumas, MD [facility]   If 7PM-7AM, please contact night-coverage [email or web address] Password [email or web address] 07/29/2016, 9:03 AM

## 2016-07-29 NOTE — Anesthesia Preprocedure Evaluation (Signed)
Anesthesia Evaluation  Patient identified by MRN, date of birth, ID band Patient awake    Reviewed: Allergy & Precautions, NPO status , Patient's Chart, lab work & pertinent test results  Airway Mallampati: II  TM Distance: >3 FB     Dental   Pulmonary shortness of breath, former smoker,    breath sounds clear to auscultation       Cardiovascular hypertension,  Rhythm:Regular Rate:Normal     Neuro/Psych    GI/Hepatic Neg liver ROS, GERD  ,  Endo/Other  negative endocrine ROS  Renal/GU negative Renal ROS     Musculoskeletal   Abdominal   Peds  Hematology  (+) anemia ,   Anesthesia Other Findings   Reproductive/Obstetrics                             Anesthesia Physical Anesthesia Plan  ASA: III  Anesthesia Plan: General   Post-op Pain Management:    Induction: Intravenous  Airway Management Planned: Oral ETT  Additional Equipment:   Intra-op Plan:   Post-operative Plan: Possible Post-op intubation/ventilation  Informed Consent:   Dental advisory given  Plan Discussed with: CRNA and Anesthesiologist  Anesthesia Plan Comments:         Anesthesia Quick Evaluation

## 2016-07-29 NOTE — Care Management Important Message (Signed)
Important Message  Patient Details  Name: Tricia Hernandez MRN: [id number] Date of Birth: 09-Dec-1930   Medicare Important Message Given:  Yes    Nathen May 07/29/2016, 10:49 AM

## 2016-07-29 NOTE — Progress Notes (Signed)
Physical Therapy Treatment Patient Details Name: Tricia Hernandez MRN: [id number] DOB: 04/04/1931 Today's Date: 07/29/2016    History of Present Illness Tricia Hernandez is a 82 y.o. female admitted with Fatigue, shortness of breath, BRBPR and rectal mass, anal CA and lung nodules noted with colostomy 12/18. PMHx: pulmonary embolism 3 years ago, lung cancer S/P resection 2013, HTN, unknown CHF, morbid obesity, dyslipidemia, chronic anticoagulation    PT Comments    Patient pleasant and willing to ambulate today. Patient required several verbal cues for RW use today, mostly for positioning within the RW , but she tolerated ambulation well with no significant increases in HR . No dyspnea or increased discomfort noted during ambulation. Patient's rectal wound is somewhat bothersome in sitting, so geopad was utilized in recliner. Will continue to follow.   HR during session: 90 - 115  SPO2 during session 2 L O2: 92%    Follow Up Recommendations  SNF     Equipment Recommendations  None recommended by PT    Recommendations for Other Services       Precautions / Restrictions Precautions Precautions: Fall Precaution Comments: geomat, rectal wound Restrictions Weight Bearing Restrictions: No    Mobility  Bed Mobility Overal bed mobility: Needs Assistance Bed Mobility: Rolling;Sidelying to Sit Rolling: Min guard Sidelying to sit: Min guard       General bed mobility comments: rolling in bed for rectal dressing application by RN. Min guard for safety and steadying for prolonged sidelying.   Transfers Overall transfer level: Needs assistance Equipment used: Rolling walker (2 wheeled) Transfers: Sit to/from Stand Sit to Stand: Min guard         General transfer comment: HR stable with sit to stand today. Min guard for steadying and patient safety, cues for hand placement.   Ambulation/Gait Ambulation/Gait assistance: Min guard Ambulation Distance (Feet): 100 Feet Assistive  device: Rolling walker (2 wheeled) Gait Pattern/deviations: Step-through pattern;Trunk flexed;Drifts right/left;Decreased stride length Gait velocity: decreased    General Gait Details: patient required cuing for body placement in RW x3, and to keep chest up. Cuing also provided to direct RW.    Stairs            Wheelchair Mobility    Modified Rankin (Stroke Patients Only)       Balance Overall balance assessment: Needs assistance   Sitting balance-Leahy Scale: Good       Standing balance-Leahy Scale: Fair Standing balance comment: used RW to support UEs                     Cognition Arousal/Alertness: Awake/alert Behavior During Therapy: WFL for tasks assessed/performed Overall Cognitive Status: Within Functional Limits for tasks assessed                      Exercises      General Comments        Pertinent Vitals/Pain Pain Assessment: 0-10 Pain Score: 5  Pain Location: sacrum Pain Descriptors / Indicators: Sore Pain Intervention(s): Limited activity within patient's tolerance;Repositioned    Home Living                      Prior Function            PT Goals (current goals can now be found in the care plan section) Acute Rehab PT Goals Patient Stated Goal: return to taking care of self  PT Goal Formulation: With patient Time For Goal Achievement: 08/12/16 Potential to Achieve  Goals: Good Progress towards PT goals: Progressing toward goals    Frequency    Min 3X/week      PT Plan Current plan remains appropriate    Co-evaluation             End of Session Equipment Utilized During Treatment: Gait belt;Oxygen Activity Tolerance: Patient tolerated treatment well;No increased pain Patient left: in chair;with call bell/phone within reach;with chair alarm set     Time: [phone] PT Time Calculation (min) (ACUTE ONLY): 32 min  Charges:  $Gait Training: 8-22 mins $Therapeutic Activity: 8-22 mins                     G Codes:      Tricia Hernandez 2016-08-10, 11:28 AM Tricia Hernandez SPT [phone]

## 2016-07-29 NOTE — Op Note (Addendum)
Operative Note  Tricia Hernandez  [id number]  [id number]  07/29/2016   Surgeon: Clovis Riley  Assistant: Melina Modena, PA  Procedure performed: laparoscopic converted to open formation of end sigmoid colostomy  Preop diagnosis: Anal SCC  Post-op diagnosis/intraop findings: Anterior abdominal wall omental adhesions. Short colon mesentery.  Specimens: none  EBL: [id number]  Complications: none  Description of procedure: After obtaining informed consent the patient was taken to the operating room and placed supine on operating room table wheregeneral endotracheal anesthesia was initiated, preoperative antibiotics were administered, SCDs applied, and a formal timeout was performed. The patient's abdomen was prepped and draped in usual sterile fashion, and the peritoneal cavity was entered in the left upper quadrant using a Visiport technique. Insufflation to 15 mmHg ensued and the camera was introduced which revealed omental adhesions to the anterior abdominal wall. Another 5 mm trocar was introduced in the left hemiabdomen, and these adhesions were sharply lysed. 35 mm trochars were placed in the right hemiabdomen, and the patient was placed in Trendelenburg position. The sigmoid and descending colon were medialized by dividing the white line of Toldt. Blunt dissection was then used to continue verbalizing the descending colon. The colon was felt to reach up to the previously mentioned ostomy site with insufflation. We then desufflated abdomen and created a ostomy site by excising a disc of skin and subcutaneous fat in the left lower quadrant. The anterior fascia was incised and the rectus muscle split following which the posterior sheath was entered. The tract was dilated to accommodate 2 fingers, and then the sigmoid colon was grasped using an atraumatic grasper and delivered up into the field. Unfortunately I was not able to bring it through the defect due to remaining tension within the  abdomen. I reinsufflated the abdomen and continued to further mobilize the sigmoid colon, mobilizing the splenic flexure as well. Still is unable to deliver the colon through the wound. At this juncture I decided to convert to open, a midline laparotomy was created in the soft tissues dissected using cautery until the linea alba could be visualized and divided. The peritoneal cavity was entered and ongoing attempts revealed that I was able to retract the sigmoid colon all the way to the midline. I elected to proceed with an end ostomy, and used a blue load linear cutting stapler to divide the colon at the rectosigmoid junction leaving a long Hartman's. Short segment of the proximal colon mesentery was divided using a LigaSure and then the end colon was brought up through the ostomy previously created with plan length and no tension. The abdomen was then reinspected and hemostasis ensured. Proper orientation of the stoma was verified. The midline laparotomy was then closed with running looped PDS in the fascia followed by staples in the skin in the per vena dressing. The prior 5 mm trocar sites were closed using staples and Band-Aids. The staple line was then resected from the colostomy using electrocautery and the stoma was matured in standard fashion. Once this was complete the stoma was able to be traversed with a single digit through the level of fascia. An ostomy appliance was applied. The stoma was pink and viable. The patient was then awakened extubated and taken to PACU in stable condition.   All counts were correct at the completion of the case

## 2016-07-29 NOTE — Progress Notes (Signed)
S: No acute events.   Vitals, labs, intake/output, and orders reviewed at this time.  Gen: A&Ox3, no distress  H&N: EOMI, atraumatic, neck supple Chest: unlabored respirations, RRR Abd: soft, nontender, nondistended Ext: warm, no edema Neuro: grossly normal  Lines/tubes/drains: PIV  A/P:  82yo w new diagnosis anal SCC. Plan for diverting colostomy today.    Romana Juniper, MD [facility], [place] Pager [phone]

## 2016-07-29 NOTE — Transfer of Care (Signed)
Immediate Anesthesia Transfer of Care Note  Patient: Tricia Hernandez  Procedure(s) Performed: Procedure(s): ATTEMPTED LAPAROSCOPIC ASSISTED COLOSTOMY (N/A) COLOSTOMY (N/A)  Patient Location: PACU  Anesthesia Type:General  Level of Consciousness: awake, oriented, sedated and patient cooperative  Airway & Oxygen Therapy: Patient Spontanous Breathing and Patient connected to nasal cannula oxygen  Post-op Assessment: Report given to RN, Post -op Vital signs reviewed and stable, Patient moving all extremities and Patient moving all extremities X 4  Post vital signs: Reviewed and stable  Last Vitals:  Vitals:   07/29/16 0827 07/29/16 1706  BP:    Pulse: (!) 108 95  Resp:  10  Temp:  36.7 C    Last Pain:  Vitals:   07/29/16 1046  TempSrc:   PainSc: 2          Complications: No apparent anesthesia complications

## 2016-07-29 NOTE — Care Management Note (Signed)
Case Management Note Original note created by Marvetta Gibbons 07/23/16 Marvetta Gibbons RN, BSN Unit 2W-Case Manager [phone]  Patient Details  Name: Tricia Hernandez MRN: [id number] Date of Birth: [birth year]  Subjective/Objective:  Pt admitted with rectal mass                   Action/Plan: PTA pt lived at home- CM to follow for d/c needs, pt was on eliquis at home  Expected Discharge Date:                  Expected Discharge Plan:  [place]  In-House Referral:     Discharge planning Services  CM Consult  Post Acute Care Choice:    Choice offered to:     DME Arranged:    DME Agency:     HH Arranged:    [place] Agency:     Status of Service:  In process, will continue to follow  If discussed at Long Length of Stay Meetings, dates discussed:    Additional Comments: 07/29/2016 Elenor Quinones, RN, BSN [phone] PT recommended SNF - CSW consulted for discharge plan  Maryclare Labrador, RN 07/29/2016, 11:55 AM

## 2016-07-30 ENCOUNTER — Encounter (HOSPITAL_COMMUNITY): Payer: Self-pay | Admitting: Surgery

## 2016-07-30 LAB — CBC WITH DIFFERENTIAL/PLATELET
BASOS PCT: 0 %
Basophils Absolute: 0 10*3/uL (ref 0.0–0.1)
EOS ABS: 0 10*3/uL (ref 0.0–0.7)
Eosinophils Relative: 0 %
HEMATOCRIT: 30.6 % — AB (ref 36.0–46.0)
Hemoglobin: 9.3 g/dL — ABNORMAL LOW (ref 12.0–15.0)
Lymphocytes Relative: 9 %
Lymphs Abs: 1 10*3/uL (ref 0.7–4.0)
MCH: 26.1 pg (ref 26.0–34.0)
MCHC: 30.4 g/dL (ref 30.0–36.0)
MCV: 85.7 fL (ref 78.0–100.0)
MONO ABS: 0.6 10*3/uL (ref 0.1–1.0)
MONOS PCT: 6 %
Neutro Abs: 9 10*3/uL — ABNORMAL HIGH (ref 1.7–7.7)
Neutrophils Relative %: 85 %
Platelets: 212 10*3/uL (ref 150–400)
RBC: 3.57 MIL/uL — ABNORMAL LOW (ref 3.87–5.11)
RDW: 14 % (ref 11.5–15.5)
WBC: 10.5 10*3/uL (ref 4.0–10.5)

## 2016-07-30 LAB — COMPREHENSIVE METABOLIC PANEL
ALK PHOS: 50 U/L (ref 38–126)
ALT: 21 U/L (ref 14–54)
ANION GAP: 10 (ref 5–15)
AST: 29 U/L (ref 15–41)
Albumin: 2.8 g/dL — ABNORMAL LOW (ref 3.5–5.0)
BUN: 7 mg/dL (ref 6–20)
CALCIUM: 8.6 mg/dL — AB (ref 8.9–10.3)
CO2: 33 mmol/L — AB (ref 22–32)
Chloride: 96 mmol/L — ABNORMAL LOW (ref 101–111)
Creatinine, Ser: 0.76 mg/dL (ref 0.44–1.00)
GFR calc non Af Amer: >60 mL/min (ref >60)
Glucose, Bld: 123 mg/dL — ABNORMAL HIGH (ref 65–99)
Potassium: 4.5 mmol/L (ref 3.5–5.1)
SODIUM: 139 mmol/L (ref 135–145)
TOTAL PROTEIN: 5.4 g/dL — AB (ref 6.5–8.1)
Total Bilirubin: 0.7 mg/dL (ref 0.3–1.2)

## 2016-07-30 LAB — HEPARIN LEVEL (UNFRACTIONATED)

## 2016-07-30 MED ORDER — HEPARIN (PORCINE) IN NACL 100-0.45 UNIT/ML-% IJ SOLN
1250.0000 [IU]/h | INTRAMUSCULAR | Status: DC
  Administered 2016-07-30 – 2016-07-31 (×2): 1250 [IU]/h via INTRAVENOUS
  Filled 2016-07-30: qty 250

## 2016-07-30 NOTE — Clinical Social Work Placement (Signed)
   CLINICAL SOCIAL WORK PLACEMENT  NOTE  Date:  07/30/2016  Patient Details  Name: Tricia Hernandez MRN: [id number] Date of Birth: May 16, 1931  Clinical Social Work is seeking post-discharge placement for this patient at the [place] level of care (*CSW will initial, date and re-position this form in  chart as items are completed):  Yes   Patient/family provided with [place] Work Department's list of facilities offering this level of care within the geographic area requested by the patient (or if unable, by the patient's family).  Yes   Patient/family informed of their freedom to choose among providers that offer the needed level of care, that participate in Medicare, Medicaid or managed care program needed by the patient, have an available bed and are willing to accept the patient.  Yes   Patient/family informed of [place]'s ownership interest in [facility] and [facility], as well as of the fact that they are under no obligation to receive care at these facilities.  PASRR submitted to EDS on       PASRR number received on       Existing PASRR number confirmed on       FL2 transmitted to all facilities in geographic area requested by pt/family on       FL2 transmitted to all facilities within larger geographic area on       Patient informed that his/her managed care company has contracts with or will negotiate with certain facilities, including the following:            Patient/family informed of bed offers received.  Patient chooses bed at       Physician recommends and patient chooses bed at      Patient to be transferred to   on  .  Patient to be transferred to facility by       Patient family notified on   of transfer.  Name of family member notified:        PHYSICIAN Please sign FL2     Additional Comment:    _______________________________________________ Wende Neighbors, LCSW 07/30/2016, 10:09 AM

## 2016-07-30 NOTE — Progress Notes (Signed)
PROGRESS NOTE [facility]   Tricia Hernandez   WPY:[id number] DOB: 1930/09/28  DOA: 07/22/2016 PCP: Clinton Quant, MD   Brief Narrative:  82 year old female with past medical history of hypertension, hyperlipidemia, PSVT, PE, PAF on Eliquis presented to the emergency room with complaining of shortness of breath and fatigue and BRBPR with rectal mass. Patient underwent on excisional rectal biopsy which revealed invasive anal squamous cell CA. Oncology was consulted. Status post diverting colostomy 12/18.   Subjective: Patient seen and examined at bedside with family . Tolerating clear liquids , denies pain   Assessment & Plan: Anal Squamous Cell Ca  Oncology consulted appreciated recommendations  Status post diverting colostomy  12/18,followed by radiation to the anus , seen by radiation oncology Palliative team consulted for support - appreciated  arrange consultation at [facility] with the medical oncology team and radiation oncology as well upon discharge  outpatient PET-CT may be warranted to further characterize the lung nodules.   Bright red blood per rectum and rectal mass - s/p excisional biopsy - Resolved  Symptomatic anemia - H&H stable Continue MiraLAX Hemoglobin stable around 9.3 postop  Hypertension - stable  History of CHF Cardiology consulted for clearance for surgical procedure.  Echo showed EF of 60-65% no wall motion abnormality. Elevated troponin likely secondary to uncontrolled Afib vs demand ischemia  No signs of fluid overload  BP stable   History of PE. A. Fib. - continues to be on Afib but well controlled  CHA2DS2-VASc Score 5 Resume Apixaban - when okay with general surgery Continue metoprolol     Pulmonary nodule. History of lung cancer. 84m nodule in the right lower lobe and 7 mm right upper lobe pulmonary nodule. Patient does have history of right sided lung cancer S/P surgical resection in the past 2013. CT chest shows multiple lung  nodules ? Metastasis vs residual from previous lung cancer. Is it carcinoid vs metastatic anal cancer    DVT prophylaxis:  Heparin gtt  Code Status: Full Family Communication: Son at bedside Disposition Plan: Home when medically stable.  Consultants:   Cardiology   Gen Surgery   Oncology   Procedures:   ECHO  ------------------------------------------------------------------- Study Conclusions  - Left ventricle: The cavity size was normal. Wall thickness was   increased in a pattern of moderate LVH. Systolic function was   normal. The estimated ejection fraction was in the range of 60%   to 65%. Wall motion was normal; there were no regional wall   motion abnormalities.  Antimicrobials:  None    Objective: Vitals:   07/29/16 1822 07/29/16 2051 07/29/16 2215 07/30/16 0515  BP: (!) 143/79 109/71 124/71 (!) 111/52  Pulse: 100 94 75 95  Resp: (!) '24 18  18  '$ Temp:  97.7 F (36.5 C)  98 F (36.7 C)  TempSrc:  Oral  Oral  SpO2: 100% 96%  98%  Weight:    114.4 kg (252 lb 3.3 oz)  Height:        Intake/Output Summary (Last 24 hours) at 07/30/16 0747 Last data filed at 07/30/16 0650  Gross per 24 hour  Intake             2100 ml  Output              700 ml  Net             1400 ml   Filed Weights   07/28/16 0700 07/29/16 0255 07/30/16 0515  Weight: 117.1 kg (258 lb 2.5 oz)  113.8 kg (250 lb 14.1 oz) 114.4 kg (252 lb 3.3 oz)    Examination: - No changes in physical exam   General exam: NAD, hard hearing Respiratory system: CTA  Cardiovascular system: IRR IRR. No murmurs  Gastrointestinal system: Soft NTND GU: Incision about 3 cm, clean, package in place. Some serosanguinous drainage    Extremities: No edema  Data Reviewed: I have personally reviewed following labs and imaging studies  CBC:  Recent Labs Lab 07/26/16 0235 07/27/16 0353 07/28/16 0246 07/29/16 0216 07/30/16 0245  WBC 7.7 6.1 6.2 7.0 10.5  NEUTROABS 5.7 3.9 4.0 4.5 9.0*  HGB 9.7*  9.3* 9.1* 9.0* 9.3*  HCT 32.8* 32.0* 31.0* 30.7* 30.6*  MCV 87.7 88.6 87.6 87.2 85.7  PLT 232 218 214 207 573   Basic Metabolic Panel:  Recent Labs Lab 07/24/16 0248 07/26/16 0235 07/30/16 0245  NA 141 138 139  K 4.0 4.4 4.5  CL 104 101 96*  CO2 24 33* 33*  GLUCOSE 105* 116* 123*  BUN 7 5* 7  CREATININE 0.85 0.91 0.76  CALCIUM 8.6* 8.5* 8.6*   GFR: Estimated Creatinine Clearance: 66.1 mL/min (by C-G formula based on SCr of 0.76 mg/dL). Liver Function Tests:  Recent Labs Lab 07/24/16 0248 07/30/16 0245  AST 20 29  ALT 12* 21  ALKPHOS 42 50  BILITOT 0.1* 0.7  PROT 5.3* 5.4*  ALBUMIN 2.8* 2.8*   No results for input(s): LIPASE, AMYLASE in the last 168 hours. No results for input(s): AMMONIA in the last 168 hours. Coagulation Profile: No results for input(s): INR, PROTIME in the last 168 hours. Cardiac Enzymes: No results for input(s): CKTOTAL, CKMB, CKMBINDEX, TROPONINI in the last 168 hours. BNP (last 3 results) No results for input(s): PROBNP in the last 8760 hours. HbA1C: No results for input(s): HGBA1C in the last 72 hours. CBG: No results for input(s): GLUCAP in the last 168 hours. Lipid Profile: No results for input(s): CHOL, HDL, LDLCALC, TRIG, CHOLHDL, LDLDIRECT in the last 72 hours. Thyroid Function Tests: No results for input(s): TSH, T4TOTAL, FREET4, T3FREE, THYROIDAB in the last 72 hours. Anemia Panel: No results for input(s): VITAMINB12, FOLATE, FERRITIN, TIBC, IRON, RETICCTPCT in the last 72 hours. Sepsis Labs: No results for input(s): PROCALCITON, LATICACIDVEN in the last 168 hours.  No results found for this or any previous visit (from the past 240 hour(s)).    Radiology Studies: No results found.   Scheduled Meds: . atorvastatin  20 mg Oral Daily  . docusate sodium  100 mg Oral BID  . feeding supplement (ENSURE ENLIVE)  237 mL Oral BID BM  . ferrous sulfate  325 mg Oral TID WC  . fluticasone  1 spray Each Nare Daily  . folic acid  1  mg Oral Daily  . metoprolol tartrate  100 mg Oral BID  . montelukast  10 mg Oral Daily  . pantoprazole  40 mg Oral Daily  . PARoxetine  20 mg Oral Daily  . polyethylene glycol  17 g Oral Daily  . senna-docusate  1 tablet Oral BID  . vitamin B-12  1,000 mcg Oral Daily   Continuous Infusions: . heparin Stopped (07/29/16 0950)  . lactated ringers 50 mL/hr at 07/29/16 1307     LOS: 7 days    Reyne Dumas, MD [facility]   If 7PM-7AM, please contact night-coverage [email or web address] Password [facility] 07/30/2016, 7:47 AM

## 2016-07-30 NOTE — Progress Notes (Signed)
ANTICOAGULATION CONSULT NOTE - Follow Up Consult  Pharmacy Consult for heparin Indication: Hx of PE and Afib  Allergies  Allergen Reactions  . Aleve [Naproxen Sodium] Hives  . Antihistamines, Chlorpheniramine-Type     ANTIHISTAMINES  . Aspirin   . Ciprofloxacin   . Codeine   . Penicillins     Has patient had a PCN reaction causing immediate rash, facial/tongue/throat swelling, SOB or lightheadedness with hypotension: Yes Has patient had a PCN reaction causing severe rash involving mucus membranes or skin necrosis: No Has patient had a PCN reaction that required hospitalization No Has patient had a PCN reaction occurring within the last 10 years: No If all of the above answers are "NO", then may proceed with Cephalosporin use.   . Rocephin [Ceftriaxone Sodium In Dextrose]   . Sulfa Antibiotics   . Hydrochlorothiazide Rash    Patient Measurements: Height: '5\' 4"'$  (162.6 cm) Weight: 252 lb 3.3 oz (114.4 kg) IBW/kg (Calculated) : 54.7 Heparin Dosing Weight: 81 kg  Vital Signs: Temp: 98.2 F (36.8 C) (12/19 1256) Temp Source: Oral (12/19 1256) BP: 112/49 (12/19 1256) Pulse Rate: 91 (12/19 1256)  Labs:  Recent Labs  07/28/16 0246 07/28/16 1244 07/29/16 0216 07/30/16 0245  HGB 9.1*  --  9.0* 9.3*  HCT 31.0*  --  30.7* 30.6*  PLT 214  --  207 212  HEPARINUNFRC 0.71* 0.64 0.50 <0.10*  CREATININE  --   --   --  0.76    Estimated Creatinine Clearance: 66.1 mL/min (by C-G formula based on SCr of 0.76 mg/dL).  Assessment: 82 year old female admitted 07/22/2016 on Eliquis PTA for history of PE and Afib (Last dose PTA 12/11). Pt was started on IV heparin while holding Eliquis for colostomy on 12/18 PM. Per surgery request, no heparin for the first 24 hrs postoperatively. Called Dr. Kae Heller and discussed about restarting heparin vs. eliquis this evening. She suggested to restart IV heparin for now, and primary team can decide on restarting Eliquis close to discharge if no  bleeding complications. Anesthesia stop time ~ 1700  Previously therapeutic on 1300 units/hr. CBC stable.   Goal of Therapy:  Heparin level 0.3-0.7 units/ml Monitor platelets by anticoagulation protocol: Yes   Plan:  - Restart IV heparin 1250 units/hr with no bolus at 1700  - f/u 8 hr heparin level at 0100 - Monitor daily heparin level, CBC and s/sx of bleeding - Will f/u plans for restarting oral anticoagulation  Maryanna Shape, PharmD, BCPS  Clinical Pharmacist  Pager: [phone]   07/30/2016 1:29 PM

## 2016-07-30 NOTE — Clinical Social Work Note (Signed)
Clinical Social Work Assessment  Patient Details  Name: Tricia Hernandez MRN: [id number] Date of Birth: 04/10/1931  Date of referral:  07/30/16               Reason for consult:  Discharge Planning                Permission sought to share information with:  Family Supports Permission granted to share information::  Yes, Verbal Permission Granted  Name::     Tricia Hernandez  Agency::     Relationship::  daughter  Contact Information:  [phone]  Housing/Transportation Living arrangements for the past 2 months:  [place] of Information:  Patient, Adult Children Patient Interpreter Needed:  None Criminal Activity/Legal Involvement Pertinent to Current Situation/Hospitalization:  No - Comment as needed Significant Relationships:  Adult Children, Other Family Members Lives with:  Self Do you feel safe going back to the place where you live?  Yes Need for family participation in patient care:  No (Coment)  Care giving concerns:  Patient son was present in the room. patient has a lot of family support from adult children  Social Worker assessment / plan:  Holiday representative met patient at bedside to offer support and discuss patients needs at discharge. Patient and son are agreeable for patient to discharge to SNF. Patient son would prefer patient to go to inpatient rehab so patient does not have to travel back and forth for her radiology appointment. Patient son stated that if patient is unable to get into CIR, they would prefer patient to attend SNF in [facility] area so patient can have the same physician. CSW will make patients attending aware of patients CIR request. CSW to complete necessary paperwork and initiate SNF search on patient behalf. CSW remains available for support and to facilitate patient discharge needs once medically stable.   Employment status:  Retired Forensic scientist:  Medicare PT Recommendations:  [place] /  Referral to community resources:  [facility]  Patient/Family's Response to care: Patient verbalized appreciation and understanding for CSW role and involvement in care. Patient agreeable with current  Discharge plan to SNF following discharge  Patient/Family's Understanding of and Emotional Response to Diagnosis, Current Treatment, and Prognosis:  Patient with good understanding of current medical state and limitations around most recent hospitalization. Patient is agreeable with SNF placement in hopes of transitioning  to a more stable environment.   Emotional Assessment Appearance:  Appears stated age Attitude/Demeanor/Rapport:  Other (attitude/demeanor appropriate) Affect (typically observed):  Accepting, Pleasant Orientation:  Oriented to Self, Oriented to Place, Oriented to  Time, Oriented to Situation Alcohol / Substance use:  Not Applicable Psych involvement (Current and /or in the community):  No (Comment)  Discharge Needs  Concerns to be addressed:  No discharge needs identified Readmission within the last 30 days:  No Current discharge risk:  None Barriers to Discharge:  No Barriers Identified   Rhea Pink, MSW,  [place]

## 2016-07-30 NOTE — Consult Note (Addendum)
[place] Nurse ostomy consult note CCS following for assessment and plan of care; Prevena dressing in place to midline abd wound. Stoma type/location:  Stoma from colostomy surgery yesterday to LLQ; pouch is intact with good seal;. Stomal assessment/size: Stoma red and viable, appears to be flush with skin level.  Pouch change not performed since it is the first post-op day and patient is in pain. Output: No stool or flatus at this time.  Ostomy pouching: 2pc.  Education provided: Educational materials left at the bedside and supplies ordered to the room for staff nurse use.  Pt's son at the bedside for demonstration of pouch appearance, opening and closing with velcro, and discussing pouching routines.  Plan to perform pouch change demonstration tomorrow.  Son states a family member recently had an ostomy and will be able to assist pt after discharge, and she will be going to a rehab facility and will have further assistance before going home. Julien Girt MSN, RN, [place], [place], [place] Enrolled patient in [place] program: No

## 2016-07-30 NOTE — Progress Notes (Signed)
S: No acute events. Reports a couple episodes of pain when she moved. Denies nausea.   Vitals, labs, intake/output, and orders reviewed at this time.  Gen: A&Ox3, no distress  H&N: EOMI, atraumatic, neck supple Chest: unlabored respirations, RRR Abd: soft, diffusely tender, nondistended, incision with provena intact. 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 Continue clears, ok to advance diet today if no nausea. Will continue to follow.    Romana Juniper, MD [facility] Surgery, [place] Pager [phone]

## 2016-07-30 NOTE — Progress Notes (Signed)
Patient lying in bed, no needs at this time, call light within reach

## 2016-07-30 NOTE — NC FL2 (Signed)
[place] LEVEL OF CARE SCREENING TOOL     IDENTIFICATION  Patient Name: Tricia Hernandez Birthdate: 11/11/1930 Sex: female Admission Date (Current Location): 07/22/2016  [facility] and [place] Number:  Herbalist and Address:  [facility] [place], [place] [address], [place], [place] [address]      Provider Number: [id number]  Attending Physician Name and Address:  Reyne Dumas, MD  Relative Name and Phone Number:  Cammy Copa, [phone]    Current Level of Care: Hospital Recommended Level of Care: [place] Prior Approval Number:    Date Approved/Denied:   PASRR Number: [id number]  Discharge Plan: SNF    Current Diagnoses: Patient Active Problem List   Diagnosis Date Noted  . Essential hypertension   . Lung nodule   . Palliative care encounter   . Shortness of breath 07/23/2016  . SOB (shortness of breath) 07/22/2016  . Symptomatic anemia 07/22/2016  . Anal cancer ([place]) 07/22/2016  . BRBPR (bright red blood per rectum) 07/22/2016  . Elevated troponin 07/22/2016  . Atrial fibrillation ([place]), CHA2DS2-VASc Score 5 07/22/2016  . Pulmonary embolus ([place])   . Obesity   . Lung cancer ([place])   . Hypertension     Orientation RESPIRATION BLADDER Height & Weight     Self, Time, Situation, Place  O2 (2) Incontinent Weight: 252 lb 3.3 oz (114.4 kg) Height:  '5\' 4"'$  (162.6 cm)  BEHAVIORAL SYMPTOMS/MOOD NEUROLOGICAL BOWEL NUTRITION STATUS      Incontinent Diet (clear liquids/thin )  AMBULATORY STATUS COMMUNICATION OF NEEDS Skin   Limited Assist Verbally Normal                       Personal Care Assistance Level of Assistance  Bathing, Feeding, Dressing Bathing Assistance: Limited assistance Feeding assistance: Independent Dressing Assistance: Limited assistance     Functional Limitations Info  Sight, Hearing, Speech Sight Info: Adequate Hearing Info: Impaired Speech Info: Adequate    SPECIAL CARE FACTORS  FREQUENCY  PT (By licensed PT), OT (By licensed OT)     PT Frequency: 5x week OT Frequency: 5x week            Contractures      Additional Factors Info  Code Status, Allergies Code Status Info: Full Code Allergies Info: ALEVE NAPROXEN SODIUM, ANTIHISTAMINES, CHLORPHENIRAMINE-TYPE, ASPIRIN, CIPROFLOXACIN, CODEINE, PENICILLINS, ROCEPHIN CEFTRIAXONE SODIUM IN DEXTROSE, SULFA ANTIBIOTICS, HYDROCHLOROTHIAZIDE           Current Medications (07/30/2016):  This is the current hospital active medication list Current Facility-Administered Medications  Medication Dose Route Frequency Provider Last Rate Last Dose  . acetaminophen (TYLENOL) tablet 650 mg  650 mg Oral Q6H PRN Lavina [place]man, MD   650 mg at 07/24/16 1323  . albuterol (PROVENTIL) (2.5 MG/3ML) 0.083% nebulizer solution 2.5 mg  2.5 mg Nebulization Q6H PRN Lavina Hamman, MD      . atorvastatin (LIPITOR) tablet 20 mg  20 mg Oral Daily Lavina Hamman, MD   20 mg at 07/28/16 1036  . docusate sodium (COLACE) capsule 100 mg  100 mg Oral BID Clovis Riley, MD   100 mg at 07/29/16 2217  . feeding supplement (ENSURE ENLIVE) (ENSURE ENLIVE) liquid 237 mL  237 mL Oral BID BM Pershing Proud, NP   237 mL at 07/28/16 1400  . ferrous sulfate tablet 325 mg  325 mg Oral TID WC Lavina Hamman, MD   325 mg at 07/30/16 1203  .  fluticasone (FLONASE) 50 MCG/ACT nasal spray 1 spray  1 spray Each Nare Daily Lavina Hamman, MD   1 spray at 07/30/16 1035  . folic acid (FOLVITE) tablet 1 mg  1 mg Oral Daily Lavina Hamman, MD   1 mg at 07/28/16 1036  . heparin ADULT infusion 100 units/mL (25000 units/220m sodium chloride 0.45%)  1,250 Units/hr Intravenous Continuous NReyne Dumas MD      . HYDROcodone-acetaminophen (NORCO/VICODIN) 5-325 MG per tablet 1-2 tablet  1-2 tablet Oral Q4H PRN KJeryl Columbia NP   1 tablet at 07/29/16 0946  . HYDROmorphone (DILAUDID) injection 0.5 mg  0.5 mg Intravenous Q2H PRN CClovis Riley MD   0.5 mg at 07/30/16  0859  . lactated ringers infusion   Intravenous Continuous CBelinda Block MD 50 mL/hr at 07/30/16 1204    . lip balm (BLISTEX) ointment   Topical PRN PLavina Hamman MD   1 application at 126/94/851020  . LORazepam (ATIVAN) tablet 0.5 mg  0.5 mg Oral Q8H PRN PLavina Hamman MD   0.5 mg at 07/28/16 1816  . metoprolol (LOPRESSOR) tablet 100 mg  100 mg Oral BID EDoreatha Lew MD   100 mg at 07/30/16 1032  . montelukast (SINGULAIR) tablet 10 mg  10 mg Oral Daily PLavina Hamman MD   10 mg at 07/30/16 1032  . ondansetron (ZOFRAN) tablet 4 mg  4 mg Oral Q6H PRN PLavina Hamman MD       Or  . ondansetron ([facility] injection 4 mg  4 mg Intravenous Q6H PRN PLavina Hamman MD   4 mg at 07/25/16 1654  . ondansetron (ZOFRAN) injection 4 mg  4 mg Intravenous Q6H PRN CClovis Riley MD      . oxyCODONE (Oxy IR/ROXICODONE) immediate release tablet 5 mg  5 mg Oral Q3H PRN CClovis Riley MD      . pantoprazole (PROTONIX) EC tablet 40 mg  40 mg Oral Daily PLavina Hamman MD   40 mg at 07/30/16 1032  . PARoxetine (PAXIL) tablet 20 mg  20 mg Oral Daily PLavina Hamman MD   20 mg at 07/29/16 2220  . polyethylene glycol (MIRALAX / GLYCOLAX) packet 17 g  17 g Oral Daily PLavina Hamman MD      . senna-docusate (Senokot-S) tablet 1 tablet  1 tablet Oral BID PLavina Hamman MD   1 tablet at 07/29/16 2217  . vitamin B-12 (CYANOCOBALAMIN) tablet 1,000 mcg  1,000 mcg Oral Daily PLavina Hamman MD   1,000 mcg at 07/28/16 1035     Discharge Medications: Please see discharge summary for a list of discharge medications.  Relevant Imaging Results:  Relevant Lab Results:   Additional Information [id number] AWende Neighbors LCSW

## 2016-07-30 NOTE — Anesthesia Postprocedure Evaluation (Addendum)
Anesthesia Post Note  Patient: Tricia Hernandez  Procedure(s) Performed: Procedure(s) (LRB): ATTEMPTED LAPAROSCOPIC ASSISTED COLOSTOMY (N/A) COLOSTOMY (N/A)  Patient location during evaluation: PACU Anesthesia Type: General Level of consciousness: sedated and patient cooperative Pain management: pain level controlled Vital Signs Assessment: post-procedure vital signs reviewed and stable Respiratory status: spontaneous breathing Cardiovascular status: stable Anesthetic complications: no       Last Vitals:  Vitals:   07/29/16 2215 07/30/16 0515  BP: 124/71 (!) 111/52  Pulse: 75 95  Resp:  18  Temp:  36.7 C    Last Pain:  Vitals:   07/30/16 0929  TempSrc:   PainSc: 0-No pain                 Nolon Nations

## 2016-07-31 LAB — CBC WITH DIFFERENTIAL/PLATELET
BASOS ABS: 0 10*3/uL (ref 0.0–0.1)
Basophils Relative: 0 %
EOS ABS: 0.1 10*3/uL (ref 0.0–0.7)
EOS PCT: 1 %
HCT: 29.7 % — ABNORMAL LOW (ref 36.0–46.0)
HEMOGLOBIN: 8.9 g/dL — AB (ref 12.0–15.0)
LYMPHS ABS: 1.2 10*3/uL (ref 0.7–4.0)
LYMPHS PCT: 14 %
MCH: 25.9 pg — ABNORMAL LOW (ref 26.0–34.0)
MCHC: 30 g/dL (ref 30.0–36.0)
MCV: 86.6 fL (ref 78.0–100.0)
Monocytes Absolute: 0.8 10*3/uL (ref 0.1–1.0)
Monocytes Relative: 9 %
NEUTROS PCT: 76 %
Neutro Abs: 6.4 10*3/uL (ref 1.7–7.7)
PLATELETS: 202 10*3/uL (ref 150–400)
RBC: 3.43 MIL/uL — AB (ref 3.87–5.11)
RDW: 14.3 % (ref 11.5–15.5)
WBC: 8.4 10*3/uL (ref 4.0–10.5)

## 2016-07-31 LAB — COMPREHENSIVE METABOLIC PANEL
ALBUMIN: 2.5 g/dL — AB (ref 3.5–5.0)
ALK PHOS: 46 U/L (ref 38–126)
ALT: 19 U/L (ref 14–54)
AST: 23 U/L (ref 15–41)
Anion gap: 6 (ref 5–15)
BUN: 7 mg/dL (ref 6–20)
CALCIUM: 8.7 mg/dL — AB (ref 8.9–10.3)
CHLORIDE: 97 mmol/L — AB (ref 101–111)
CO2: 36 mmol/L — AB (ref 22–32)
Creatinine, Ser: 0.79 mg/dL (ref 0.44–1.00)
GFR calc Af Amer: >60 mL/min (ref >60)
GFR calc non Af Amer: >60 mL/min (ref >60)
GLUCOSE: 113 mg/dL — AB (ref 65–99)
Potassium: 4 mmol/L (ref 3.5–5.1)
Sodium: 139 mmol/L (ref 135–145)
Total Bilirubin: 0.4 mg/dL (ref 0.3–1.2)
Total Protein: 4.9 g/dL — ABNORMAL LOW (ref 6.5–8.1)

## 2016-07-31 LAB — HEPARIN LEVEL (UNFRACTIONATED): Heparin Unfractionated: <0.1 IU/mL — ABNORMAL LOW (ref 0.30–0.70)

## 2016-07-31 MED ORDER — APIXABAN 5 MG PO TABS
5.0000 mg | ORAL_TABLET | Freq: Two times a day (BID) | ORAL | Status: DC
  Administered 2016-07-31 – 2016-08-02 (×5): 5 mg via ORAL
  Filled 2016-07-31 (×5): qty 1

## 2016-07-31 NOTE — Progress Notes (Signed)
PROGRESS NOTE [facility]   Mollyann Halbert   MPN:[id number] DOB: [birth year]  DOA: 07/22/2016 PCP: Clinton Quant, MD   Brief Narrative:  82 year old female with past medical history of hypertension, hyperlipidemia, PSVT, PE, PAF on Eliquis presented to the emergency room with complaining of shortness of breath and fatigue and BRBPR with rectal mass. Patient underwent on excisional rectal biopsy which revealed invasive anal squamous cell CA. Oncology was consulted. Status post diverting colostomy 12/18.   Subjective:  Pain well controlled. Denies nausea, tolerated clears yesterday. Advance to solid food   Assessment & Plan: Anal Squamous Cell Ca  Oncology consulted appreciated recommendations  Status post diverting colostomy  12/18,followed by radiation to the anus , seen by radiation oncology Palliative team consulted for support - appreciated  arr[facility] with the medical oncology team and radiation oncology as well upon discharge  outpatient PET-CT may be warranted to further characterize the lung nodules.  Advance diet per surgery  Bright red blood per rectum and rectal mass - s/p excisional biopsy - Resolved  Symptomatic anemia - H&H stable Continue MiraLAX Hemoglobin stable . No signs of active bleeding   Hypertension - stable  History of CHF Cardiology was consulted for clearance for surgical procedure.  Echo showed EF of 60-65% no wall motion abnormality. Elevated troponin likely secondary to uncontrolled Afib vs demand ischemia  No signs of fluid overload  BP stable   History of PE. A. Fib. - continues to be on Afib but well controlled  CHA2DS2-VASc Score 5 Resume Apixaban - discontinue heparin, discussed with Clovis Riley, MD Continue metoprolol     Pulmonary nodule. History of lung cancer. 43m nodule in the right lower lobe and 7 mm right upper lobe pulmonary nodule. Patient does have history of right sided lung cancer S/P  surgical resection in the past 2013. CT chest shows multiple lung nodules ? Metastasis vs residual from previous lung cancer. Is it carcinoid vs metastatic anal cancer    DVT prophylaxis:  Heparin gtt  Code Status: Full Family Communication: Son at bedside Disposition Plan: Home when medically stable as per surgery recommendations  Consultants:   Cardiology   Gen Surgery   Oncology   Procedures:   ECHO  ------------------------------------------------------------------- Study Conclusions  - Left ventricle: The cavity size was normal. Wall thickness was   increased in a pattern of moderate LVH. Systolic function was   normal. The estimated ejection fraction was in the range of 60%   to 65%. Wall motion was normal; there were no regional wall   motion abnormalities.  Antimicrobials:  None    Objective: Vitals:   07/30/16 1256 07/30/16 1939 07/31/16 0440 07/31/16 0926  BP: (!) 112/49 (!) 116/56 110/65 112/69  Pulse: 91 99 100 98  Resp: '17 18 18   '$ Temp: 98.2 F (36.8 C) 98.9 F (37.2 C) 98.1 F (36.7 C)   TempSrc: Oral Oral Oral   SpO2: 98% 95% 97%   Weight:   115.2 kg (253 lb 15.5 oz)   Height:        Intake/Output Summary (Last 24 hours) at 07/31/16 1028 Last data filed at 07/30/16 2240  Gross per 24 hour  Intake                0 ml  Output              320 ml  Net             -320 ml  Filed Weights   07/29/16 0255 07/30/16 0515 07/31/16 0440  Weight: 113.8 kg (250 lb 14.1 oz) 114.4 kg (252 lb 3.3 oz) 115.2 kg (253 lb 15.5 oz)    Examination: - No changes in physical exam   General exam: NAD, hard hearing Respiratory system: CTA  Cardiovascular system: IRR IRR. No murmurs  Gastrointestinal system: Soft NTND GU: Incision about 3 cm, clean, package in place. Some serosanguinous drainage    Extremities: No edema  Data Reviewed: I have personally reviewed following labs and imaging studies  CBC:  Recent Labs Lab 07/27/16 0353 07/28/16 0246  07/29/16 0216 07/30/16 0245 07/31/16 0031  WBC 6.1 6.2 7.0 10.5 8.4  NEUTROABS 3.9 4.0 4.5 9.0* 6.4  HGB 9.3* 9.1* 9.0* 9.3* 8.9*  HCT 32.0* 31.0* 30.7* 30.6* 29.7*  MCV 88.6 87.6 87.2 85.7 86.6  PLT 218 214 207 212 161   Basic Metabolic Panel:  Recent Labs Lab 07/26/16 0235 07/30/16 0245 07/31/16 0031  NA 138 139 139  K 4.4 4.5 4.0  CL 101 96* 97*  CO2 33* 33* 36*  GLUCOSE 116* 123* 113*  BUN 5* 7 7  CREATININE 0.91 0.76 0.79  CALCIUM 8.5* 8.6* 8.7*   GFR: Estimated Creatinine Clearance: 66.4 mL/min (by C-G formula based on SCr of 0.79 mg/dL). Liver Function Tests:  Recent Labs Lab 07/30/16 0245 07/31/16 0031  AST 29 23  ALT 21 19  ALKPHOS 50 46  BILITOT 0.7 0.4  PROT 5.4* 4.9*  ALBUMIN 2.8* 2.5*   No results for input(s): LIPASE, AMYLASE in the last 168 hours. No results for input(s): AMMONIA in the last 168 hours. Coagulation Profile: No results for input(s): INR, PROTIME in the last 168 hours. Cardiac Enzymes: No results for input(s): CKTOTAL, CKMB, CKMBINDEX, TROPONINI in the last 168 hours. BNP (last 3 results) No results for input(s): PROBNP in the last 8760 hours. HbA1C: No results for input(s): HGBA1C in the last 72 hours. CBG: No results for input(s): GLUCAP in the last 168 hours. Lipid Profile: No results for input(s): CHOL, HDL, LDLCALC, TRIG, CHOLHDL, LDLDIRECT in the last 72 hours. Thyroid Function Tests: No results for input(s): TSH, T4TOTAL, FREET4, T3FREE, THYROIDAB in the last 72 hours. Anemia Panel: No results for input(s): VITAMINB12, FOLATE, FERRITIN, TIBC, IRON, RETICCTPCT in the last 72 hours. Sepsis Labs: No results for input(s): PROCALCITON, LATICACIDVEN in the last 168 hours.  No results found for this or any previous visit (from the past 240 hour(s)).    Radiology Studies: No results found.   Scheduled Meds: . atorvastatin  20 mg Oral Daily  . docusate sodium  100 mg Oral BID  . feeding supplement (ENSURE ENLIVE)  237  mL Oral BID BM  . ferrous sulfate  325 mg Oral TID WC  . fluticasone  1 spray Each Nare Daily  . folic acid  1 mg Oral Daily  . metoprolol tartrate  100 mg Oral BID  . montelukast  10 mg Oral Daily  . pantoprazole  40 mg Oral Daily  . PARoxetine  20 mg Oral Daily  . polyethylene glycol  17 g Oral Daily  . senna-docusate  1 tablet Oral BID  . vitamin B-12  1,000 mcg Oral Daily   Continuous Infusions: . heparin 1,250 Units/hr (07/31/16 0932)  . lactated ringers 50 mL/hr at 07/31/16 0932     LOS: 8 days    Reyne Dumas, MD [facility]   If 7PM-7AM, please contact night-coverage [email or web address] Password [facility] 07/31/2016, 10:28 AM

## 2016-07-31 NOTE — Progress Notes (Signed)
Clinical Social Worker met with family at bedside to keep them updated on patients discharge plans. Patient and son Tricia Hernandez) had questions about radiology treatment for patient after discharge. Patient and family stated that they want to do patients treatment plan in [place] to make it more convenient for patient. Patient stated she wants to do radiology follow up at [place] center with Dr.Manning. CSW stated she will contact Dr.Manning office again tomorrow 12/21 since Dr.Manning was not in for the day. CSW informed family of SNF bed available to patient and the family were interested in [facility]. Patients son stated he will check out facility and let CSW know of family decision. CSW remains available for support and discharge needs.  Rhea Pink, MSW,  [place]

## 2016-07-31 NOTE — Progress Notes (Signed)
Patient lying in bed, daughter present at bedside. Ativan given per pt request. Call light within reach.

## 2016-07-31 NOTE — Progress Notes (Addendum)
ANTICOAGULATION CONSULT NOTE - Follow Up Consult  Pharmacy Consult for heparin Indication: Hx of PE and Afib  Allergies  Allergen Reactions  . Aleve [Naproxen Sodium] Hives  . Antihistamines, Chlorpheniramine-Type     ANTIHISTAMINES  . Aspirin   . Ciprofloxacin   . Codeine   . Penicillins     Has patient had a PCN reaction causing immediate rash, facial/tongue/throat swelling, SOB or lightheadedness with hypotension: Yes Has patient had a PCN reaction causing severe rash involving mucus membranes or skin necrosis: No Has patient had a PCN reaction that required hospitalization No Has patient had a PCN reaction occurring within the last 10 years: No If all of the above answers are "NO", then may proceed with Cephalosporin use.   . Rocephin [Ceftriaxone Sodium In Dextrose]   . Sulfa Antibiotics   . Hydrochlorothiazide Rash    Patient Measurements: Height: '5\' 4"'$  (162.6 cm) Weight: 253 lb 15.5 oz (115.2 kg) IBW/kg (Calculated) : 54.7 Heparin Dosing Weight: 81 kg  Vital Signs: Temp: 98.1 F (36.7 C) (12/20 0440) Temp Source: Oral (12/20 0440) BP: 112/69 (12/20 0926) Pulse Rate: 98 (12/20 0926)  Labs:  Recent Labs  07/29/16 0216 07/30/16 0245 07/31/16 0031 07/31/16 0040  HGB 9.0* 9.3* 8.9*  --   HCT 30.7* 30.6* 29.7*  --   PLT 207 212 202  --   HEPARINUNFRC 0.50 <0.10*  --  <0.10*  CREATININE  --  0.76 0.79  --     Estimated Creatinine Clearance: 66.4 mL/min (by C-G formula based on SCr of 0.79 mg/dL).  Assessment: 82 year old female admitted 07/22/2016 on Eliquis PTA for history of PE and Afib (Last dose PTA 12/11). Pt was started on IV heparin while holding Eliquis for colostomy on 12/18 PM. Per surgery request, no heparin for the first 24 hrs postoperatively. Called Dr. Kae Heller and discussed about restarting heparin vs. eliquis this evening. She suggested to restart IV heparin for now, and primary team can decide on restarting Eliquis close to discharge if no  bleeding complications.   Heparin level remains subtherapeutic < 0.1 on 1250 units/hr, but IV was infiltrated. Will switch to another IV site.     Goal of Therapy:  Heparin level 0.3-0.7 units/ml Monitor platelets by anticoagulation protocol: Yes   Plan:  - Continue IV heparin 1250 units/hr - f/u 8 hr heparin level at 1800 - Monitor daily heparin level, CBC and s/sx of bleeding - Will f/u plans for restarting oral anticoagulation  Maryanna Shape, PharmD, BCPS  Clinical Pharmacist  Pager: [phone]   07/31/2016 9:39 AM    ============================== Addendum: Pharmacy is consulted to restart Eliquis Scr 0.79, est crcl ~ 65 ml/min.   Plan: Eliquis '5mg'$  po BID first dose @ 1200 DC IV heparin when Eliquis is given. Pharmacy sign off, will monitor labs perihernially   ============================= Maryanna Shape, PharmD, BCPS  Clinical Pharmacist  Pager: [phone]

## 2016-07-31 NOTE — Progress Notes (Signed)
S: No acute events. Pain well controlled. Denies nausea, tolerated clears yesterday.   Vitals, labs, intake/output, and orders reviewed at this time.  Gen: A&Ox3, no distress  H&N: EOMI, atraumatic, neck supple Chest: unlabored respirations, RRR Abd: soft, mildly tender around incision and left hemiabdomen, nondistended, incision with provena intact. 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 Advance diet slowly as tolerated. Will continue to follow.    Romana Juniper, MD [facility] Surgery, [place] Pager [phone]

## 2016-07-31 NOTE — Progress Notes (Signed)
ANTICOAGULATION CONSULT NOTE - Follow Up Consult  Pharmacy Consult for heparin Indication: h/o PE and Afib  Labs:  Recent Labs  07/29/16 0216 07/30/16 0245 07/31/16 0031 07/31/16 0040  HGB 9.0* 9.3* 8.9*  --   HCT 30.7* 30.6* 29.7*  --   PLT 207 212 202  --   HEPARINUNFRC 0.50 <0.10*  --  <0.10*  CREATININE  --  0.76 0.79  --     Assessment/Plan:  82yo female subtherapeutic on heparin after resumed post-op but hesitant to increase rate since was therapeutic at current rate prior to OR. Will continue gtt at current rate for now and check additional level.   Wynona Neat, PharmD, BCPS  07/31/2016,1:55 AM

## 2016-07-31 NOTE — Consult Note (Signed)
[place] Nurse ostomy consult note Stoma type/location: Colostomy to LLQ Stomal assessment/size: Stoma dark red and viable, flush with skin level, 1 1/4 inches Peristomal assessment: intact skin surrounding Output: Small amt black liquid stool  Ostomy pouching: 1pc. With barrier ring Education provided: Demonstrated pouch change to patient and son using one piece pouch with barrier ring to assist with maintaining a seal.  Pt was able to open and close velcro to empty.  Discussed pouching routines and ordering supplies. Pt denies further questions at this time; she expects to go to a rehab facility after discharge. Enrolled patient in [place] program: Yes Julien Girt MSN, RN, [place], [place], [place]

## 2016-07-31 NOTE — Care Management Note (Signed)
Case Management Note Original note created by Marvetta Gibbons 07/23/16 Marvetta Gibbons RN, BSN Unit 2W-Case Manager [phone]  Patient Details  Name: Tricia Hernandez MRN: [id number] Date of Birth: [birth year]  Subjective/Objective:  Pt admitted with rectal mass                   Action/Plan: PTA pt lived at home- CM to follow for d/c needs, pt was on eliquis at home  Expected Discharge Date:                  Expected Discharge Plan:  Skilled Nursing Facility  In-House Referral:  Clinical Social Work  Discharge planning Services  CM Consult  Post Acute Care Choice:    Choice offered to:     DME Arranged:    DME Agency:     HH Arranged:    [place] Agency:     Status of Service:  Completed, signed off  If discussed at [employer], dates discussed:    Additional Comments:  07/31/16- 1545- Marvetta Gibbons RN, CM- referral for f/u appointments- plan is for pt to go to SNF, CSW working on placement needs- and has reached out to Dr. [name] regarding Radiation/Onc. F/u.   07/29/16-Samantha Claxton, RN, BSN [phone] PT recommended SNF - CSW consulted for discharge plan  Dawayne Patricia, RN 07/31/2016, 3:50 PM

## 2016-07-31 NOTE — Plan of Care (Signed)
Problem: Safety: Goal: Ability to remain free from injury will improve Outcome: Progressing Pt remained  Free of falls during shift.

## 2016-08-01 ENCOUNTER — Telehealth: Payer: Self-pay | Admitting: *Deleted

## 2016-08-01 LAB — COMPREHENSIVE METABOLIC PANEL
ALT: 16 U/L (ref 14–54)
AST: 18 U/L (ref 15–41)
Albumin: 2.4 g/dL — ABNORMAL LOW (ref 3.5–5.0)
Alkaline Phosphatase: 50 U/L (ref 38–126)
Anion gap: 7 (ref 5–15)
BUN: 7 mg/dL (ref 6–20)
CHLORIDE: 98 mmol/L — AB (ref 101–111)
CO2: 37 mmol/L — ABNORMAL HIGH (ref 22–32)
CREATININE: 0.85 mg/dL (ref 0.44–1.00)
Calcium: 8.7 mg/dL — ABNORMAL LOW (ref 8.9–10.3)
Glucose, Bld: 106 mg/dL — ABNORMAL HIGH (ref 65–99)
POTASSIUM: 4.5 mmol/L (ref 3.5–5.1)
Sodium: 142 mmol/L (ref 135–145)
Total Bilirubin: 0.3 mg/dL (ref 0.3–1.2)
Total Protein: 4.9 g/dL — ABNORMAL LOW (ref 6.5–8.1)

## 2016-08-01 NOTE — Progress Notes (Signed)
  Radiation Oncology         [phone] ________________________________  Name: Tricia Hernandez MRN: [id number]  Date: 07/22/2016  DOB: [birth year]  Chart Note:  Patient has decided to seek treatment here in [place].  We will plan to CT simulate patient tomorrow at 8 am at [facility] (via ambulance transport) and then potentially patient will be discharged to nearby SNF for outpatient radiotherapy.  ________________________________  Sheral Apley. [name], M.D.

## 2016-08-01 NOTE — Care Management Important Message (Signed)
Important Message  Patient Details  Name: Tricia Hernandez MRN: [id number] Date of Birth: 02-Mar-1931   Medicare Important Message Given:  Yes    Nathen May 08/01/2016, 10:32 AM

## 2016-08-01 NOTE — Discharge Instructions (Addendum)

## 2016-08-01 NOTE — Telephone Encounter (Signed)
Called [facility] [phone] spoke with Doug,dispatcher, gave information to him about the patient, she needs to be picked up at 7 30am and have patient transported via [facility]  To be here in [place] for Ct simulation, per [facility], he stated "I cannot guarantee that time frame, this is our change of shift and may be a little later than that" but he will put it on the books, he hasn't gotten a call from the floor at [facility] , he stated he will call them himself  , thanked [place], informed Dr. [name] patient might not get here till around 830 am due to change in work shift 4:53 PM +  4:52 PM

## 2016-08-01 NOTE — Progress Notes (Signed)
Patient resting comfortably in bed, no needs at this time. Call light within reach

## 2016-08-01 NOTE — Progress Notes (Addendum)
PROGRESS NOTE [facility]   Jessaca Philippi   WVP:[id number] DOB: 06/28/1931  DOA: 07/22/2016 PCP: Clinton Quant, MD   Brief Narrative:  82 year old female with past medical history of hypertension, hyperlipidemia, PSVT, PE, PAF on Eliquis presented to the emergency room with complaining of shortness of breath and fatigue and BRBPR with rectal mass. Patient underwent on excisional rectal biopsy which revealed invasive anal squamous cell CA. Oncology was consulted. Status post diverting colostomy 12/18.   Subjective:  Denies nausea, tolerated diet yesterday,    Assessment & Plan: Anal Squamous Cell Ca  Oncology consulted appreciated recommendations  Status post diverting colostomy  12/18, patient will need  radiation to the anus , seen by radiation oncology 12/18 Palliative team consulted for support - appreciated  arrange consultation at [facility] with the medical oncology team and radiation oncology as well upon discharge  outpatient PET-CT may be warranted to further characterize the lung nodules.  She should potentially establish care with the cancer center at [facility] for consultation on return home Advance diet per surgery Discharge plans for surgery Wound care instructions per surgery-discussed with Clovis Riley, MD 12/20,12/21 Social work setting up appointment with Dr [name] for 12/22    Bright red blood per rectum and rectal mass - s/p excisional biopsy - Resolved  Symptomatic anemia - H&H stable Continue MiraLAX Hemoglobin stable . No signs of active bleeding   Hypertension - stable  History of CHF Cardiology was consulted for clearance for surgical procedure.  Echo showed EF of 60-65% no wall motion abnormality. Elevated troponin likely secondary to uncontrolled Afib vs demand ischemia  No signs of fluid overload  BP stable   History of PE. A. Fib. - continues to be on Afib but well controlled  CHA2DS2-VASc Score 5 Resume Apixaban - discontinue  heparin, discussed with Clovis Riley, MD Continue metoprolol     Pulmonary nodule. History of lung cancer. 52m nodule in the right lower lobe and 7 mm right upper lobe pulmonary nodule. Patient does have history of right sided lung cancer S/P surgical resection in the past 2013. CT chest shows multiple lung nodules ? Metastasis vs residual from previous lung cancer. Is it carcinoid vs metastatic anal cancer    DVT prophylaxis:  Heparin gtt  Code Status: Full Family Communication: Son at bedside Disposition Plan: Home when medically stable as per surgery recommendations  Consultants:   Cardiology   Gen Surgery   Oncology   Procedures:   ECHO  ------------------------------------------------------------------- Study Conclusions  - Left ventricle: The cavity size was normal. Wall thickness was   increased in a pattern of moderate LVH. Systolic function was   normal. The estimated ejection fraction was in the range of 60%   to 65%. Wall motion was normal; there were no regional wall   motion abnormalities.  Antimicrobials:  None    Objective: Vitals:   07/31/16 0926 07/31/16 1428 07/31/16 2122 08/01/16 0443  BP: 112/69 134/62 (!) 124/56 115/65  Pulse: 98 95 92 92  Resp:  '18 18 18  '$ Temp:  98.2 F (36.8 C) 98.6 F (37 C) 97.9 F (36.6 C)  TempSrc:  Oral Oral Oral  SpO2:  97% 97% 98%  Weight:    117.6 kg (259 lb 4.2 oz)  Height:        Intake/Output Summary (Last 24 hours) at 08/01/16 0849 Last data filed at 08/01/16 0444  Gross per 24 hour  Intake  0 ml  Output             1350 ml  Net            -1350 ml   Filed Weights   07/30/16 0515 07/31/16 0440 08/01/16 0443  Weight: 114.4 kg (252 lb 3.3 oz) 115.2 kg (253 lb 15.5 oz) 117.6 kg (259 lb 4.2 oz)    Examination: - No changes in physical exam   General exam: NAD, hard hearing Respiratory system: CTA  Cardiovascular system: IRR IRR. No murmurs  Gastrointestinal system: Soft  NTND GU: Incision about 3 cm, clean, package in place. Some serosanguinous drainage    Extremities: No edema  Data Reviewed: I have personally reviewed following labs and imaging studies  CBC:  Recent Labs Lab 07/27/16 0353 07/28/16 0246 07/29/16 0216 07/30/16 0245 07/31/16 0031  WBC 6.1 6.2 7.0 10.5 8.4  NEUTROABS 3.9 4.0 4.5 9.0* 6.4  HGB 9.3* 9.1* 9.0* 9.3* 8.9*  HCT 32.0* 31.0* 30.7* 30.6* 29.7*  MCV 88.6 87.6 87.2 85.7 86.6  PLT 218 214 207 212 295   Basic Metabolic Panel:  Recent Labs Lab 07/26/16 0235 07/30/16 0245 07/31/16 0031 08/01/16 0317  NA 138 139 139 142  K 4.4 4.5 4.0 4.5  CL 101 96* 97* 98*  CO2 33* 33* 36* 37*  GLUCOSE 116* 123* 113* 106*  BUN 5* '7 7 7  '$ CREATININE 0.91 0.76 0.79 0.85  CALCIUM 8.5* 8.6* 8.7* 8.7*   GFR: Estimated Creatinine Clearance: 63.3 mL/min (by C-G formula based on SCr of 0.85 mg/dL). Liver Function Tests:  Recent Labs Lab 07/30/16 0245 07/31/16 0031 08/01/16 0317  AST '29 23 18  '$ ALT '21 19 16  '$ ALKPHOS 50 46 50  BILITOT 0.7 0.4 0.3  PROT 5.4* 4.9* 4.9*  ALBUMIN 2.8* 2.5* 2.4*   No results for input(s): LIPASE, AMYLASE in the last 168 hours. No results for input(s): AMMONIA in the last 168 hours. Coagulation Profile: No results for input(s): INR, PROTIME in the last 168 hours. Cardiac Enzymes: No results for input(s): CKTOTAL, CKMB, CKMBINDEX, TROPONINI in the last 168 hours. BNP (last 3 results) No results for input(s): PROBNP in the last 8760 hours. HbA1C: No results for input(s): HGBA1C in the last 72 hours. CBG: No results for input(s): GLUCAP in the last 168 hours. Lipid Profile: No results for input(s): CHOL, HDL, LDLCALC, TRIG, CHOLHDL, LDLDIRECT in the last 72 hours. Thyroid Function Tests: No results for input(s): TSH, T4TOTAL, FREET4, T3FREE, THYROIDAB in the last 72 hours. Anemia Panel: No results for input(s): VITAMINB12, FOLATE, FERRITIN, TIBC, IRON, RETICCTPCT in the last 72 hours. Sepsis  Labs: No results for input(s): PROCALCITON, LATICACIDVEN in the last 168 hours.  No results found for this or any previous visit (from the past 240 hour(s)).    Radiology Studies: No results found.   Scheduled Meds: . apixaban  5 mg Oral BID  . atorvastatin  20 mg Oral Daily  . docusate sodium  100 mg Oral BID  . feeding supplement (ENSURE ENLIVE)  237 mL Oral BID BM  . ferrous sulfate  325 mg Oral TID WC  . fluticasone  1 spray Each Nare Daily  . folic acid  1 mg Oral Daily  . metoprolol tartrate  100 mg Oral BID  . montelukast  10 mg Oral Daily  . pantoprazole  40 mg Oral Daily  . PARoxetine  20 mg Oral Daily  . polyethylene glycol  17 g Oral Daily  . senna-docusate  1 tablet Oral BID  . vitamin B-12  1,000 mcg Oral Daily   Continuous Infusions: . lactated ringers 50 mL/hr at 08/01/16 0554     LOS: 9 days    Reyne Dumas, MD [facility]   If 7PM-7AM, please contact night-coverage [email or web address] Password [facility] 08/01/2016, 8:49 AM

## 2016-08-01 NOTE — Progress Notes (Signed)
Clinical Social Worker spoke to Dr. Tyler Pita via phone in regards to patients radiation plan after discharge. CSW stated to Dr. [name] that patient and family are on board with pursuing radiation in [place]. Dr. [name] made an 8 am appointment at [facility] 12/22 for care planning after discharge. CSW spoke with Dr.Abrol face to face about upcoming appointment and she agreed. CSW asked when patient will be discharging from hospital, Dr. Allyson Sabal unsure about discharge at the moment. CSW will keep following up with patients discharge needs since patient will discharge to SNF in the [place] area. CSW made family aware of appointment tomorrow. [facility] will be called for [facility] tranfers to [facility].  Rhea Pink, MSW,  [place]

## 2016-08-01 NOTE — Progress Notes (Signed)
Physical Therapy Treatment Patient Details Name: Tricia Hernandez MRN: [id number] DOB: Aug 01, 1931 Today's Date: 08/01/2016    History of Present Illness Tricia Hernandez is a 82 y.o. female admitted with Fatigue, shortness of breath, BRBPR and rectal mass, anal CA and lung nodules noted with colostomy 12/18. PMHx: pulmonary embolism 3 years ago, lung cancer S/P resection 2013, HTN, unknown CHF, morbid obesity, dyslipidemia, chronic anticoagulation    PT Comments    Patient was pleasant and willing to ambulate upon PT arrival, although she noted sacral pain. Patient required 2 L O2 via nasal cannula, due to decreasing O2 saturation without supplemental oxygen (see below). PT assisted RN with dressing and bedding change due to complete saturation. Patient tolerated ambulation well and did not seem to be limited mobility-wise by her rectal pain. Will continue to follow.   HR during session: 93-123 SPO2 on room air: 87-91%, returned to nasal cannula  SPO2 with 2 L O2: 87-97%, low end immediately after ambulation, returned to 90's quickly with deep breathing.    Follow Up Recommendations  SNF;Supervision for mobility/OOB     Equipment Recommendations  None recommended by PT    Recommendations for Other Services       Precautions / Restrictions Precautions Precautions: Fall Precaution Comments: geomat, rectal wound Restrictions Weight Bearing Restrictions: No    Mobility  Bed Mobility Overal bed mobility: Needs Assistance Bed Mobility: Rolling;Sidelying to Sit Rolling: Min guard Sidelying to sit: Min guard       General bed mobility comments: rolling in bed to change rectal dressing by RN. Patient guarded for saftety due to prolonged sidelying from dressing change. Patient took increased time to move from sidelying to sit.   Transfers Overall transfer level: Needs assistance Equipment used: Rolling walker (2 wheeled) Transfers: Sit to/from Stand Sit to Stand: Min guard          General transfer comment: guarding for safety and steadying with rising from EOB. Steadying during standing rectal dressing taping.   Ambulation/Gait Ambulation/Gait assistance: Min guard Ambulation Distance (Feet): 100 Feet Assistive device: Rolling walker (2 wheeled) Gait Pattern/deviations: Step-through pattern;Decreased stride length;Trunk flexed Gait velocity: decreased  Gait velocity interpretation: Below normal speed for age/gender General Gait Details: patient required cuing to step into RW, keep trunk extended, and direct RW.    Stairs            Wheelchair Mobility    Modified Rankin (Stroke Patients Only)       Balance Overall balance assessment: Needs assistance Sitting-balance support: Feet supported;Bilateral upper extremity supported Sitting balance-Leahy Scale: Good Sitting balance - Comments: sat EOB independently   Standing balance support: Bilateral upper extremity supported Standing balance-Leahy Scale: Fair Standing balance comment: uses RW for support                     Cognition Arousal/Alertness: Awake/alert Behavior During Therapy: WFL for tasks assessed/performed Overall Cognitive Status: Within Functional Limits for tasks assessed                      Exercises      General Comments        Pertinent Vitals/Pain Pain Assessment: 0-10 Pain Score: 4  Pain Location: sacrum Pain Descriptors / Indicators: Aching;Burning;Sore Pain Intervention(s): Monitored during session;Repositioned    Home Living                      Prior Function  PT Goals (current goals can now be found in the care plan section) Acute Rehab PT Goals Patient Stated Goal: progress mobility  PT Goal Formulation: With patient Time For Goal Achievement: 08/15/16 Potential to Achieve Goals: Good Progress towards PT goals: Progressing toward goals    Frequency    Min 3X/week      PT Plan Current plan remains  appropriate    Co-evaluation             End of Session Equipment Utilized During Treatment: Gait belt;Oxygen Activity Tolerance: Patient tolerated treatment well;Patient limited by fatigue Patient left: in chair;with call bell/phone within reach     Time: 0755-0834 PT Time Calculation (min) (ACUTE ONLY): 39 min  Charges:  $Gait Training: 8-22 mins $Therapeutic Activity: 23-37 mins                    G Codes:      Tricia Hernandez 08-07-2016, 10:46 AM Tricia Hernandez SPT [phone]

## 2016-08-01 NOTE — Progress Notes (Signed)
S: No acute events. Pain well controlled. Denies nausea, tolerated diet yesterday.   Vitals, labs, intake/output, and orders reviewed at this time.  Gen: A&Ox3, no distress  H&N: EOMI, atraumatic, neck supple Chest: unlabored respirations, RRR Abd: soft, mildly tender, incision with provena intact. Ostomy flush with skin, pink with some slough, scant pasty stool and large amount of gas  in the bag Ext: warm, no edema Neuro: grossly normal  Lines/tubes/drains: PIV, LLQ end colostomy  A/P:  POD 3 s/p end colostomy for Anal SCC with incontinence and anticipated radiation. Diet and mobilization as tolerated. Will continue to follow.    Romana Juniper, MD [facility], [place] Pager [phone]

## 2016-08-02 ENCOUNTER — Ambulatory Visit
Admit: 2016-08-02 | Discharge: 2016-08-02 | Disposition: A | Discharge status: Home / Self Care | Payer: Medicare Other | Source: Ambulatory Visit | Attending: Radiation Oncology | Admitting: Radiation Oncology

## 2016-08-02 DIAGNOSIS — C269 Malignant neoplasm of ill-defined sites within the digestive system: Secondary | ICD-10-CM | POA: Diagnosis not present

## 2016-08-02 DIAGNOSIS — F329 Major depressive disorder, single episode, unspecified: Secondary | ICD-10-CM | POA: Diagnosis present

## 2016-08-02 DIAGNOSIS — L98499 Non-pressure chronic ulcer of skin of other sites with unspecified severity: Secondary | ICD-10-CM | POA: Diagnosis not present

## 2016-08-02 DIAGNOSIS — I482 Chronic atrial fibrillation: Secondary | ICD-10-CM | POA: Diagnosis not present

## 2016-08-02 DIAGNOSIS — R0902 Hypoxemia: Secondary | ICD-10-CM | POA: Diagnosis not present

## 2016-08-02 DIAGNOSIS — R410 Disorientation, unspecified: Secondary | ICD-10-CM | POA: Diagnosis not present

## 2016-08-02 DIAGNOSIS — Z6841 Body Mass Index (BMI) 40.0 and over, adult: Secondary | ICD-10-CM | POA: Diagnosis not present

## 2016-08-02 DIAGNOSIS — H9190 Unspecified hearing loss, unspecified ear: Secondary | ICD-10-CM | POA: Diagnosis present

## 2016-08-02 DIAGNOSIS — J189 Pneumonia, unspecified organism: Secondary | ICD-10-CM | POA: Diagnosis not present

## 2016-08-02 DIAGNOSIS — E559 Vitamin D deficiency, unspecified: Secondary | ICD-10-CM | POA: Diagnosis present

## 2016-08-02 DIAGNOSIS — N6019 Diffuse cystic mastopathy of unspecified breast: Secondary | ICD-10-CM | POA: Diagnosis present

## 2016-08-02 DIAGNOSIS — Z933 Colostomy status: Secondary | ICD-10-CM | POA: Diagnosis not present

## 2016-08-02 DIAGNOSIS — I471 Supraventricular tachycardia: Secondary | ICD-10-CM | POA: Diagnosis present

## 2016-08-02 DIAGNOSIS — E6609 Other obesity due to excess calories: Secondary | ICD-10-CM | POA: Diagnosis not present

## 2016-08-02 DIAGNOSIS — R911 Solitary pulmonary nodule: Secondary | ICD-10-CM | POA: Diagnosis not present

## 2016-08-02 DIAGNOSIS — I2782 Chronic pulmonary embolism: Secondary | ICD-10-CM | POA: Diagnosis not present

## 2016-08-02 DIAGNOSIS — D696 Thrombocytopenia, unspecified: Secondary | ICD-10-CM | POA: Diagnosis present

## 2016-08-02 DIAGNOSIS — Y95 Nosocomial condition: Secondary | ICD-10-CM | POA: Diagnosis present

## 2016-08-02 DIAGNOSIS — R748 Abnormal levels of other serum enzymes: Secondary | ICD-10-CM | POA: Diagnosis present

## 2016-08-02 DIAGNOSIS — R110 Nausea: Secondary | ICD-10-CM | POA: Diagnosis not present

## 2016-08-02 DIAGNOSIS — E785 Hyperlipidemia, unspecified: Secondary | ICD-10-CM | POA: Diagnosis present

## 2016-08-02 DIAGNOSIS — C20 Malignant neoplasm of rectum: Secondary | ICD-10-CM | POA: Diagnosis not present

## 2016-08-02 DIAGNOSIS — I10 Essential (primary) hypertension: Secondary | ICD-10-CM | POA: Diagnosis not present

## 2016-08-02 DIAGNOSIS — F419 Anxiety disorder, unspecified: Secondary | ICD-10-CM | POA: Diagnosis present

## 2016-08-02 DIAGNOSIS — I2699 Other pulmonary embolism without acute cor pulmonale: Secondary | ICD-10-CM | POA: Diagnosis not present

## 2016-08-02 DIAGNOSIS — K6289 Other specified diseases of anus and rectum: Secondary | ICD-10-CM | POA: Diagnosis not present

## 2016-08-02 DIAGNOSIS — G4700 Insomnia, unspecified: Secondary | ICD-10-CM | POA: Diagnosis not present

## 2016-08-02 DIAGNOSIS — Z510 Encounter for antineoplastic radiation therapy: Secondary | ICD-10-CM | POA: Insufficient documentation

## 2016-08-02 DIAGNOSIS — M6281 Muscle weakness (generalized): Secondary | ICD-10-CM | POA: Diagnosis not present

## 2016-08-02 DIAGNOSIS — D649 Anemia, unspecified: Secondary | ICD-10-CM | POA: Diagnosis present

## 2016-08-02 DIAGNOSIS — C801 Malignant (primary) neoplasm, unspecified: Secondary | ICD-10-CM | POA: Diagnosis not present

## 2016-08-02 DIAGNOSIS — K219 Gastro-esophageal reflux disease without esophagitis: Secondary | ICD-10-CM | POA: Diagnosis present

## 2016-08-02 DIAGNOSIS — C211 Malignant neoplasm of anal canal: Secondary | ICD-10-CM | POA: Diagnosis not present

## 2016-08-02 DIAGNOSIS — J069 Acute upper respiratory infection, unspecified: Secondary | ICD-10-CM | POA: Diagnosis present

## 2016-08-02 DIAGNOSIS — I5032 Chronic diastolic (congestive) heart failure: Secondary | ICD-10-CM | POA: Diagnosis not present

## 2016-08-02 DIAGNOSIS — C210 Malignant neoplasm of anus, unspecified: Secondary | ICD-10-CM | POA: Insufficient documentation

## 2016-08-02 DIAGNOSIS — I481 Persistent atrial fibrillation: Secondary | ICD-10-CM | POA: Diagnosis not present

## 2016-08-02 DIAGNOSIS — R0602 Shortness of breath: Secondary | ICD-10-CM | POA: Diagnosis not present

## 2016-08-02 DIAGNOSIS — G319 Degenerative disease of nervous system, unspecified: Secondary | ICD-10-CM | POA: Diagnosis present

## 2016-08-02 DIAGNOSIS — J9601 Acute respiratory failure with hypoxia: Secondary | ICD-10-CM | POA: Diagnosis not present

## 2016-08-02 DIAGNOSIS — R0600 Dyspnea, unspecified: Secondary | ICD-10-CM | POA: Diagnosis not present

## 2016-08-02 DIAGNOSIS — C3491 Malignant neoplasm of unspecified part of right bronchus or lung: Secondary | ICD-10-CM | POA: Diagnosis not present

## 2016-08-02 DIAGNOSIS — Z85118 Personal history of other malignant neoplasm of bronchus and lung: Secondary | ICD-10-CM | POA: Diagnosis not present

## 2016-08-02 DIAGNOSIS — B373 Candidiasis of vulva and vagina: Secondary | ICD-10-CM | POA: Diagnosis present

## 2016-08-02 DIAGNOSIS — I4891 Unspecified atrial fibrillation: Secondary | ICD-10-CM | POA: Diagnosis present

## 2016-08-02 MED ORDER — LORAZEPAM 0.5 MG PO TABS: 0.5000 mg | ORAL_TABLET | Freq: Three times a day (TID) | ORAL | 0 refills | Status: DC | PRN

## 2016-08-02 MED ORDER — HYDROCODONE-ACETAMINOPHEN 5-325 MG PO TABS: 1.0000 | ORAL_TABLET | ORAL | 0 refills | Status: DC | PRN

## 2016-08-02 NOTE — Progress Notes (Signed)
PT Cancellation Note  Patient Details Name: Tricia Hernandez MRN: [id number] DOB: 1931-01-25   Cancelled Treatment:    Reason Eval/Treat Not Completed: Other (comment) (RN denied treatment at this time as [facility] on the way to transport pt)   Numair Masden B Amayiah Gosnell 08/02/2016, 7:28 AM  Elwyn Reach, [place]

## 2016-08-02 NOTE — Discharge Summary (Signed)
Physician Discharge Summary  Tricia Hernandez BCW:[id number] DOB: April 14, 1931 DOA: 07/22/2016  PCP: Clinton Quant, MD  Admit date: 07/22/2016 Discharge date: 08/02/2016  Admitted From: home Disposition:  SNF  Recommendations for Outpatient Follow-up:  1. Follow up with radiation oncology as scheduled 2. Follow up with Dr. Lindi Adie in 2 weeks 3. Follow up with Dr. Kae Heller in 2 weeks   Discharge Condition: stable CODE STATUS: Full code Diet recommendation: heart healthy  HPI: per Dr. Posey Pronto, Tricia Hernandez is a 82 y.o. female with Past medical history of pulmonary embolism 3 years ago, lung cancer S/P resection 2013, HTN, unknown CHF, morbid obesity, dyslipidemia, chronic anticoagulation. The patient presented with complaints of shortness of breath and fatigue. This has been ongoing for last 1 month progressively worsening. Patient also has a rectal mass which she has initially noted in June 2017 and has been receiving outpatient treatment from her PCP as well as outpatient surgery without any benefit. Patient was referred to GI and followed up with GI today in the clinic and was urgently referred to general surgery from where patient was sent to ER for further workup. At the time of my evaluation the patient is denying any complaints of chest pain or chest tightness at rest or on exertion. Patient does have dyspnea on exertion. No nausea no vomiting. No abdominal pain. No fever no chills no cough. No recent change in medications. Patient has a large rectal mass which has been constantly slowly oozing since last few months and causes her to have extreme discomfort while laying flat as well as walking. Denies any diarrhea.  ED Course: Patient was found to be having mild anemia as well as elevated troponin. Hospitalist was consulted for further workup of shortness of breath as well as elevated troponin. Gen. surgery was consulted.  At her baseline ambulates without any walker And is  independent for most of her ADL; manages her medication on her own.  Hospital Course: Discharge Diagnoses:  Principal Problem:   Anal cancer ([name]) Active Problems:   SOB (shortness of breath)   Pulmonary embolus (HCC)   Obesity   Lung cancer (HCC)   Hypertension   Symptomatic anemia   BRBPR (bright red blood per rectum)   Elevated troponin   Atrial fibrillation ([place]), CHA2DS2-VASc Score 5   Shortness of breath   Essential hypertension   Lung nodule   Palliative care encounter  Anal Squamous Cell Ca  - Oncology consulted appreciated recommendations, patient seen by Dr. Lindi Adie, she would like to follow up with him in office and establish her oncology care in [place] rather than [place] - general surgery consulted and patient is status post diverting colostomy  12/18,  - patient will need radiation to the anus, seen by radiation oncology 12/18, underwent simulation on 12/22 and she will receive treatments as an outpatient - Palliative team consulted for support - appreciated  -outpatient PET-CT may be warranted to further characterize the lung nodules, defer to Dr. Lindi Adie timing  - OK for d/c from surgery standpoint Surgical site care per Dr. Kae Heller, "Provena should be peeled off and discarded on 08/05/16. If the battery gives out before this and it is no longer holding suction, it can be removed at that time. The incisions are all closed with staples and can be left open to air. OK to shower and let soap and water run over incisions, pat dry. No scrubbing, no lotions or ointments to incisions." Bright red blood per rectum and rectal mass - s/p  excisional biopsy - Resolved  Symptomatic anemia - H&H stable. No signs of active bleeding Hypertension - stable  Chronic diastolic CHF - Cardiology was consulted for clearance for surgical procedure.  - Echo showed EF of 60-65% no wall motion abnormality. - Elevated troponin likely secondary to uncontrolled Afib vs demand ischemia  - No  signs of fluid overload  History of PE - anticoagulated A. Fib, eprsistent  - continues to be in Afib but well controlled  - CHA2DS2-VASc Score 5 - Resume Apixaban, continue metoprolol   Pulmonary nodule  - History of lung cancer. - 31m nodule in the right lower lobe and 7 mm right upper lobe pulmonary nodule. - Patient does have history of right sided lung cancer S/P surgical resection in the past 2013. - CT chest shows multiple lung nodules ? Metastasis vs residual from previous lung cancer. Is it carcinoid vs metastatic anal cancer, will need a PET scan to better determine     Discharge Instructions   Allergies as of 08/02/2016      Reactions   Aleve [naproxen Sodium] Hives   Antihistamines, Chlorpheniramine-type    ANTIHISTAMINES   Aspirin    Ciprofloxacin    Codeine    Penicillins    Has patient had a PCN reaction causing immediate rash, facial/tongue/throat swelling, SOB or lightheadedness with hypotension: Yes Has patient had a PCN reaction causing severe rash involving mucus membranes or skin necrosis: No Has patient had a PCN reaction that required hospitalization No Has patient had a PCN reaction occurring within the last 10 years: No If all of the above answers are "NO", then may proceed with Cephalosporin use.   Rocephin [ceftriaxone Sodium In Dextrose]    Sulfa Antibiotics    Hydrochlorothiazide Rash      Medication List    TAKE these medications   albuterol 108 (90 Base) MCG/ACT inhaler Commonly known as:  PROVENTIL HFA;VENTOLIN HFA Inhale 2 puffs into the lungs every 6 (six) hours as needed for wheezing or shortness of breath.   atorvastatin 20 MG tablet Commonly known as:  LIPITOR Take 20 mg by mouth daily.   CALCIUM 600+D 600-400 MG-UNIT tablet Generic drug:  Calcium Carbonate-Vitamin D Take 2 tablets by mouth daily.   docusate sodium 100 MG capsule Commonly known as:  COLACE Take 100 mg by mouth daily as needed for mild constipation.     ELIQUIS 5 MG Tabs tablet Generic drug:  apixaban Take 5 mg by mouth 2 (two) times daily.   esomeprazole 40 MG capsule Commonly known as:  NEXIUM Take 40 mg by mouth daily at 12 noon.   fenofibrate 160 MG tablet Take 160 mg by mouth daily.   fluticasone 50 MCG/ACT nasal spray Commonly known as:  FLONASE Place 1 spray into both nostrils daily.   folic acid 4099MCG tablet Commonly known as:  FOLVITE Take 400 mcg by mouth daily.   HYDROcodone-acetaminophen 5-325 MG tablet Commonly known as:  NORCO/VICODIN Take 1-2 tablets by mouth every 4 (four) hours as needed for moderate pain or severe pain.   hydrocortisone 25 MG suppository Commonly known as:  ANUSOL-HC Place 25 mg rectally 2 (two) times daily.   ipratropium-albuterol 0.5-2.5 (3) MG/3ML Soln Commonly known as:  DUONEB Take 3 mLs by nebulization 2 (two) times daily.   Iron 325 (65 Fe) MG Tabs Take 1 tablet by mouth 2 (two) times daily.   LORazepam 0.5 MG tablet Commonly known as:  ATIVAN Take 1 tablet (0.5 mg total) by mouth  every 8 (eight) hours as needed for anxiety.   metoprolol 100 MG tablet Commonly known as:  LOPRESSOR Take 100 mg by mouth 2 (two) times daily.   montelukast 10 MG tablet Commonly known as:  SINGULAIR Take 10 mg by mouth daily.   multivitamin with minerals Tabs tablet Take 1 tablet by mouth daily.   PARoxetine 20 MG tablet Commonly known as:  PAXIL Take 20 mg by mouth daily.   pyridOXINE 100 MG tablet Commonly known as:  VITAMIN B-6 Take 100 mg by mouth daily.   trimethoprim 100 MG tablet Commonly known as:  TRIMPEX Take 100 mg by mouth 2 (two) times daily.   vitamin B-12 1000 MCG tablet Commonly known as:  CYANOCOBALAMIN Take 1,000 mcg by mouth daily.   Vitamin D3 1000 units Caps Take 1 capsule by mouth daily.      Follow-up Information    Rulon Eisenmenger, MD. Schedule an appointment as soon as possible for a visit in 2 week(s).   Specialty:  Hematology and  Oncology Contact information: [place] [id number] [facility], [place]. Schedule an appointment as soon as possible for a visit in 2 week(s).   Specialty:  General Surgery Contact information: [phone]          Allergies  Allergen Reactions  . Aleve [Naproxen Sodium] Hives  . Antihistamines, Chlorpheniramine-Type     ANTIHISTAMINES  . Aspirin   . Ciprofloxacin   . Codeine   . Penicillins     Has patient had a PCN reaction causing immediate rash, facial/tongue/throat swelling, SOB or lightheadedness with hypotension: Yes Has patient had a PCN reaction causing severe rash involving mucus membranes or skin necrosis: No Has patient had a PCN reaction that required hospitalization No Has patient had a PCN reaction occurring within the last 10 years: No If all of the above answers are "NO", then may proceed with Cephalosporin use.   . Rocephin [Ceftriaxone Sodium In Dextrose]   . Sulfa Antibiotics   . Hydrochlorothiazide Rash    Consultations:  Cardiology   Oncology  Radiation oncology  Palliative   Procedures/Studies:  2D echo  Study Conclusions - Left ventricle: The cavity size was normal. Wall thickness was increased in a pattern of moderate LVH. Systolic function was normal. The estimated ejection fraction was in the range of 60% to 65%. Wall motion was normal; there were no regional wall motion abnormalities. - Aortic valve: Mildly calcified annulus. - Right atrium: The atrium was moderately dilated. - Tricuspid valve: There was mild-moderate regurgitation.  Dg Chest 2 View  Result Date: 07/22/2016 CLINICAL DATA:  Shortness of breath and anemia EXAM: CHEST  2 VIEW COMPARISON:  None. FINDINGS: There is mild bilateral interstitial thickening likely chronic. Mild area of linear airspace disease in the right mid lung likely reflecting scarring. 8 mm right lower lobe pulmonary nodule. 7 mm right upper lobe pulmonary  nodule. There is no pleural effusion or pneumothorax. The heart and mediastinal contours are unremarkable. The osseous structures are unremarkable. IMPRESSION: No active cardiopulmonary disease. 8 mm right lower lobe and mm right upper lobe pulmonary nodule. Recommend further evaluation with a nonemergent CT of the chest. Electronically Signed   By: Kathreen Devoid   On: 07/22/2016 16:56   Ct Chest Wo Contrast  Result Date: 07/25/2016 CLINICAL DATA:  Patient with history of lung cancer and right lung surgery. Newly diagnosed rectal mass. EXAM: CT CHEST WITHOUT CONTRAST  TECHNIQUE: Multidetector CT imaging of the chest was performed following the standard protocol without IV contrast. COMPARISON:  CT abdomen pelvis 07/23/2016. FINDINGS: Cardiovascular: Calcified atherosclerotic plaque involving the thoracic aorta. Heart is enlarged. No pericardial effusion. Coronary arterial vascular calcifications. Mediastinum/Nodes: No enlarged axillary, mediastinal or hilar lymphadenopathy. Small hiatal hernia. Lungs/Pleura: Postsurgical changes within the right mid lung. There is peribronchial consolidation within the right middle lobe. Irregular nodules are demonstrated within the right upper lobe measuring 10 mm (image 52; series 205) and 14 mm (image 50; series 205). Cluster of nodules demonstrated within the right lower lobe measuring 2.1 x 1.8 cm in total (image 69; series 205). 4 mm subpleural left lower lobe nodule (image 88; series 205). 3 mm right lower lobe nodule (image 91; series 205). 3 mm left lower lobe nodule (image 64; series 205). 4 mm left lower lobe nodule (image 60; series 205). 10 mm ground-glass nodule within the peripheral left upper lobe (image 43; series 205). Additional scattered 2-3 mm nodules within the left upper and left lower lobes. No pleural effusion or pneumothorax. Upper Abdomen: Liver is normal in size and contour. The adrenal glands are unremarkable. Musculoskeletal: Thoracic spine  degenerative changes. No aggressive or acute appearing osseous lesions. IMPRESSION: Postsurgical changes within the right hemithorax compatible patient's history of lung malignancy. No priors are available for comparison. There are adjacent irregular nodules within the right upper lobe and cluster of nodules within the right lower lobe which are nonspecific in etiology given lack of priors for comparison. Primary pulmonary malignancy, metastatic disease or infectious/inflammatory processes are considered. Multiple additional smaller bilateral pulmonary nodules and focus of ground-glass opacities are demonstrated. Recommend comparison with outside prior examinations to assess for interval change size stability. Aortic atherosclerosis. Electronically Signed   By: Lovey Newcomer M.D.   On: 07/25/2016 20:27   Ct Abdomen Pelvis W Contrast  Result Date: 07/23/2016 CLINICAL DATA:  Poor historian bright red blood per rectum anemia and rectal mass EXAM: CT ABDOMEN AND PELVIS WITH CONTRAST TECHNIQUE: Multidetector CT imaging of the abdomen and pelvis was performed using the standard protocol following bolus administration of intravenous contrast. CONTRAST:  139m ISOVUE-300 IOPAMIDOL (ISOVUE-300) INJECTION 61% COMPARISON:  None. FINDINGS: Lower chest: Linear scar or atelectasis within the lingula and left lower lobe posteriorly. Lobulated subpleural 1.4 cm density right lung base is contiguous with a more focal nodular density measuring 8.5 mm. No effusion or consolidation. Borderline heart size. No pericardial effusion. Coronary artery calcifications. Hepatobiliary: No focal hepatic abnormality. Gallbladder not well identified and is presumably surgically absent. No biliary dilatation. Pancreas: Unremarkable. No pancreatic ductal dilatation or surrounding inflammatory changes. Spleen: Normal in size without focal abnormality. Adrenals/Urinary Tract: Adrenal glands are within normal limits. Left kidney unremarkable.  Cortical irregularity right kidney could relate to focal scarring. Mildly enlarged right extrarenal pelvis with prominent right ureter. No calcified stones seen along the course of the right ureter. Bladder is unremarkable. Stomach/Bowel: The stomach is nonenlarged. There is no dilated small bowel. There is diverticular disease of the colon. There is irregular posterior wall thickening of the rectum, this is contiguous with a partially visualized soft tissue mass which measures 5.1 by 5.7 cm. Vascular/Lymphatic: Atherosclerosis of the aorta. No enlarged retroperitoneal nodes. There are multiple abnormally enlarged inguinal lymph nodes, on the right this measures 3.7 by 2.3 cm and on the left, this measures 4.3 by 2.2 cm. Reproductive: Status post hysterectomy. No adnexal masses. Other: No free air or free fluid. Musculoskeletal: Degenerative changes of the  spine. No suspicious bone lesions. IMPRESSION: 1. Irregular posterior rectal wall thickening which is contiguous with a partially visualized posterior rectal/anal soft tissue mass measuring at least 5.7 cm. 2. Multiple abnormally enlarged bilateral inguinal lymph nodes as described above. Metastatic disease cannot be excluded. 3. Colon diverticular disease without acute inflammation 4. Probable scarring involving right kidney. Enlarged right extrarenal pelvis and prominent right ureter without calcified stone along the course of the right ureter. 5. Right lower lobe nodular density measuring 8.5 mm. Consider dedicated chest CT for further evaluation. Electronically Signed   By: Donavan Foil M.D.   On: 07/23/2016 16:05     Subjective: - no chest pain, shortness of breath, no abdominal pain, nausea or vomiting.   Discharge Exam: Vitals:   08/01/16 2131 08/02/16 0432  BP: 117/71 116/62  Pulse: 91 96  Resp: 18 18  Temp: 98.4 F (36.9 C) 97.7 F (36.5 C)   Vitals:   08/01/16 0940 08/01/16 1416 08/01/16 2131 08/02/16 0432  BP:  109/65 117/71 116/62   Pulse: 93 94 91 96  Resp:  '18 18 18  '$ Temp:  98 F (36.7 C) 98.4 F (36.9 C) 97.7 F (36.5 C)  TempSrc:  Oral Oral Oral  SpO2: 91% 96% 99% 98%  Weight:    118.4 kg (261 lb 0.4 oz)  Height:        General: Pt is alert, awake, not in acute distress Cardiovascular: irregular Respiratory: CTA bilaterally, no wheezing, no rhonchi Abdominal: Soft, NT, ND, bowel sounds + Extremities: no edema, no cyanosis    The results of significant diagnostics from this hospitalization (including imaging, microbiology, ancillary and laboratory) are listed below for reference.     Microbiology: No results found for this or any previous visit (from the past 240 hour(s)).   Labs: BNP (last 3 results)  Recent Labs  07/22/16 2028  BNP 568.1*   Basic Metabolic Panel:  Recent Labs Lab 07/30/16 0245 07/31/16 0031 08/01/16 0317  NA 139 139 142  K 4.5 4.0 4.5  CL 96* 97* 98*  CO2 33* 36* 37*  GLUCOSE 123* 113* 106*  BUN '7 7 7  '$ CREATININE 0.76 0.79 0.85  CALCIUM 8.6* 8.7* 8.7*   Liver Function Tests:  Recent Labs Lab 07/30/16 0245 07/31/16 0031 08/01/16 0317  AST '29 23 18  '$ ALT '21 19 16  '$ ALKPHOS 50 46 50  BILITOT 0.7 0.4 0.3  PROT 5.4* 4.9* 4.9*  ALBUMIN 2.8* 2.5* 2.4*   No results for input(s): LIPASE, AMYLASE in the last 168 hours. No results for input(s): AMMONIA in the last 168 hours. CBC:  Recent Labs Lab 07/27/16 0353 07/28/16 0246 07/29/16 0216 07/30/16 0245 07/31/16 0031  WBC 6.1 6.2 7.0 10.5 8.4  NEUTROABS 3.9 4.0 4.5 9.0* 6.4  HGB 9.3* 9.1* 9.0* 9.3* 8.9*  HCT 32.0* 31.0* 30.7* 30.6* 29.7*  MCV 88.6 87.6 87.2 85.7 86.6  PLT 218 214 207 212 202   Cardiac Enzymes: No results for input(s): CKTOTAL, CKMB, CKMBINDEX, TROPONINI in the last 168 hours. BNP: Invalid input(s): POCBNP CBG: No results for input(s): GLUCAP in the last 168 hours. D-Dimer No results for input(s): DDIMER in the last 72 hours. Hgb A1c No results for input(s): HGBA1C in the last 72  hours. Lipid Profile No results for input(s): CHOL, HDL, LDLCALC, TRIG, CHOLHDL, LDLDIRECT in the last 72 hours. Thyroid function studies No results for input(s): TSH, T4TOTAL, T3FREE, THYROIDAB in the last 72 hours.  Invalid input(s): FREET3 Anemia work up No  results for input(s): VITAMINB12, FOLATE, FERRITIN, TIBC, IRON, RETICCTPCT in the last 72 hours. Urinalysis No results found for: COLORURINE, APPEARANCEUR, LABSPEC, [place], GLUCOSEU, HGBUR, BILIRUBINUR, KETONESUR, PROTEINUR, UROBILINOGEN, NITRITE, LEUKOCYTESUR Sepsis Labs Invalid input(s): PROCALCITONIN,  WBC,  LACTICIDVEN Microbiology No results found for this or any previous visit (from the past 240 hour(s)).   Time coordinating discharge: Over 30 minutes  SIGNED:  Marzetta Board, MD  [facility] 08/02/2016, 10:25 AM Pager [phone]  If 7PM-7AM, please contact night-coverage [email or web address] Password [email or web address]

## 2016-08-02 NOTE — Care Management Note (Signed)
Case Management Note Original note created by Marvetta Gibbons 07/23/16 Marvetta Gibbons RN, BSN Unit 2W-Case Manager [phone]  Patient Details  Name: Tricia Hernandez MRN: [id number] Date of Birth: 21-Sep-1930  Subjective/Objective:  Pt admitted with rectal mass                   Action/Plan: PTA pt lived at home- CM to follow for d/c needs, pt was on eliquis at home  Expected Discharge Date:    08/02/16              Expected Discharge Plan:  Skilled Nursing Facility  In-House Referral:  Clinical Social Work  Discharge planning Services  CM Consult  Post Acute Care Choice:    Choice offered to:     DME Arranged:    DME Agency:     HH Arranged:    [place] Agency:     Status of Service:  Completed, signed off  If discussed at [employer], dates discussed:    Additional Comments:  08/02/16- 1100- Tricia Lheureux rN, CM- pt for d/c to SNF today- CSW following- [facility] am for appointment with Dr. Tammi Hernandez for Rad/Onc f/u and planning.   07/31/16- 1545- Marvetta Gibbons RN, CM- referral for f/u appointments- plan is for pt to go to SNF, CSW working on placement needs- and has reached out to Dr. Tammi Hernandez regarding Radiation/Onc. F/u.   07/29/16-Tricia Claxton, RN, BSN [phone] PT recommended SNF - CSW consulted for discharge plan  Dawayne Patricia, RN 08/02/2016, 11:06 AM

## 2016-08-02 NOTE — Progress Notes (Signed)
  Radiation Oncology         [phone] ________________________________  Name: Tricia Hernandez MRN: [id number]  Date: 07/22/2016  DOB: 1931/03/05  SIMULATION AND TREATMENT PLANNING NOTE    ICD-9-CM ICD-10-CM   1. Other pulmonary embolism without acute cor pulmonale, unspecified chronicity (HCC) 415.19 I26.99   2. Obesity due to excess calories, unspecified classification, unspecified whether serious comorbidity present 278.00 E66.09   3. Malignant neoplasm of right lung, unspecified part of lung (HCC) 162.9 C34.91   4. Essential hypertension 401.9 I10   5. Mass of anus 787.99 K62.89 CT ABDOMEN PELVIS W CONTRAST     CT ABDOMEN PELVIS W CONTRAST  6. Lung nodule 793.11 R91.1 CT CHEST WO CONTRAST     CT CHEST WO CONTRAST  7. Rectal mass 787.99 K62.9   8. Anal cancer ([place]) 154.3 C21.0     DIAGNOSIS:  82 yo woman with Stage IV (T3, N3, M1) Anal cancer ([place]) pending PET-CT  NARRATIVE:  The patient was brought to the [facility].  Identity was confirmed.  All relevant records and images related to the planned course of therapy were reviewed.  The patient freely provided informed written consent to proceed with treatment after reviewing the details related to the planned course of therapy. The consent form was witnessed and verified by the simulation staff.  Then, the patient was set-up in a stable reproducible  supine and frog-leg position for radiation therapy.  CT images were obtained.  Surface markings were placed.  The CT images were loaded into the planning software.  Then the target and avoidance structures were contoured.  Treatment planning then occurred.  The radiation prescription was entered and confirmed.  Then, I designed and supervised the construction of a total of one medically necessary complex treatment devices [employer].  I have requested : Intensity Modulated Radiotherapy (IMRT) is medically necessary for this case for the following reason:  Small bowel sparing..  I  have ordered:Nutrition Consult  PLAN:  The patient will receive 54 Gy in 30 fraction.  ________________________________  Sheral Apley [name], M.D.

## 2016-08-02 NOTE — Progress Notes (Signed)
S: No acute events. Pain well controlled. Denies nausea, tolerated diet yesterday and large amount of stool output.   Vitals, labs, intake/output, and orders reviewed at this time.720 Po, 500 stool, 700 uop  Gen: A&Ox3, no distress  H&N: EOMI, atraumatic, neck supple Chest: unlabored respirations, RRR Abd: soft, mildly tender, incision with provena intact. Ostomy flush with skin, pink with some slough, gas in the bag Ext: warm, no edema Neuro: grossly normal  Lines/tubes/drains: PIV, LLQ end colostomy  A/P:  POD 4 s/p end colostomy for Anal SCC with incontinence and anticipated radiation. Diet and mobilization as tolerated. She is eating well and having bowel function. From surgery standpoint she is cleared for discharge.  Ostomy care per WOCN recommendations Provena should be peeled off and discarded on 08/05/16. If the battery gives out before this and it is no longer holding suction, it can be removed at that time. The incisions are all closed with staples and can be left open to air. OK to shower and let soap and water run over incisions, pat dry. No scrubbing, no lotions or ointments to incisions.  Follow up with [facility] in 2 weeks for wound check.    Romana Juniper, MD [facility], [place] Pager [phone]

## 2016-08-02 NOTE — Progress Notes (Signed)
Clinical Social Worker facilitated patient discharge including contacting patient family and facility to confirm patient discharge plans.  Clinical information faxed to facility and family agreeable with plan.  CSW arranged ambulance transport via [facility] to [facility] .  RN Jeneen Rinks to call [phone] report prior to discharge.  Clinical Social Worker will sign off for now as social work intervention is no longer needed. Please consult [place] again if new need arises.  Rhea Pink, MSW, [place]

## 2016-08-02 NOTE — Progress Notes (Signed)
OT Cancellation Note  Patient Details Name: Tricia Hernandez MRN: [id number] DOB: 1930/11/21   Cancelled Treatment:    Reason Eval/Treat Not Completed: Patient at procedure or test/ unavailable (at [facility] for radiation). Will follow up for OT eval as time allows.  Binnie Kand M.S., OTR/L Pager: [phone]  08/02/2016, 8:54 AM

## 2016-08-02 NOTE — Progress Notes (Signed)
Discharged to home with family office visits in place teaching done  

## 2016-08-02 NOTE — Consult Note (Addendum)
[facility] Nurse ostomy consult note Stoma type/location: Colostomy to LLQ Stomal assessment/size: Stoma dark red and viable, flush with skin level, 1 1/4 inches Peristomal assessment: intact skin surrounding Output: Large amt semiformed brown stool  Ostomy pouching: 1pc. with barrier ring Education provided: Demonstrated pouch change to patient and daughter using one piece pouch with barrier ring to assist with maintaining a seal.  Pt was able to open and close velcro to empty and assist with pouch application.  Reviewed pouching routines and ordering supplies. Pt denies further questions at this time; she expects to go to a rehab facility after discharge for further assistance. Extra supplies ordered to the bedside for staff nurse use. Daughter is familiar with pouching activities since she states her husband previously had an ostomy. Enrolled patient in [place] program: Yes Julien Girt MSN, RN, [place], [place], [place]

## 2016-08-07 DIAGNOSIS — I10 Essential (primary) hypertension: Secondary | ICD-10-CM | POA: Diagnosis not present

## 2016-08-07 DIAGNOSIS — C20 Malignant neoplasm of rectum: Secondary | ICD-10-CM | POA: Diagnosis not present

## 2016-08-07 DIAGNOSIS — I482 Chronic atrial fibrillation: Secondary | ICD-10-CM | POA: Diagnosis not present

## 2016-08-07 DIAGNOSIS — I5032 Chronic diastolic (congestive) heart failure: Secondary | ICD-10-CM | POA: Diagnosis not present

## 2016-08-08 ENCOUNTER — Telehealth: Payer: Self-pay | Admitting: Hematology and Oncology

## 2016-08-08 DIAGNOSIS — L98499 Non-pressure chronic ulcer of skin of other sites with unspecified severity: Secondary | ICD-10-CM | POA: Diagnosis not present

## 2016-08-08 NOTE — Telephone Encounter (Signed)
Discharge nurse call to sch follow up. Gave nurse best date/time per family preferance

## 2016-08-09 ENCOUNTER — Ambulatory Visit: Payer: Medicare Other | Admitting: Physician Assistant

## 2016-08-09 DIAGNOSIS — Z510 Encounter for antineoplastic radiation therapy: Secondary | ICD-10-CM | POA: Diagnosis not present

## 2016-08-09 DIAGNOSIS — C211 Malignant neoplasm of anal canal: Secondary | ICD-10-CM | POA: Diagnosis not present

## 2016-08-09 DIAGNOSIS — C210 Malignant neoplasm of anus, unspecified: Secondary | ICD-10-CM | POA: Diagnosis not present

## 2016-08-13 ENCOUNTER — Ambulatory Visit
Admission: RE | Admit: 2016-08-13 | Discharge: 2016-08-13 | Disposition: A | Discharge status: Home / Self Care | Payer: Medicare Other | Source: Ambulatory Visit | Attending: Radiation Oncology | Admitting: Radiation Oncology

## 2016-08-13 DIAGNOSIS — Z510 Encounter for antineoplastic radiation therapy: Secondary | ICD-10-CM | POA: Diagnosis not present

## 2016-08-13 DIAGNOSIS — C210 Malignant neoplasm of anus, unspecified: Secondary | ICD-10-CM | POA: Diagnosis not present

## 2016-08-13 DIAGNOSIS — C211 Malignant neoplasm of anal canal: Secondary | ICD-10-CM | POA: Diagnosis not present

## 2016-08-14 ENCOUNTER — Ambulatory Visit
Admission: RE | Admit: 2016-08-14 | Discharge: 2016-08-14 | Disposition: A | Discharge status: Home / Self Care | Payer: Medicare Other | Source: Ambulatory Visit | Attending: Radiation Oncology | Admitting: Radiation Oncology

## 2016-08-14 DIAGNOSIS — Z510 Encounter for antineoplastic radiation therapy: Secondary | ICD-10-CM | POA: Diagnosis not present

## 2016-08-14 DIAGNOSIS — C210 Malignant neoplasm of anus, unspecified: Secondary | ICD-10-CM | POA: Diagnosis not present

## 2016-08-14 DIAGNOSIS — C211 Malignant neoplasm of anal canal: Secondary | ICD-10-CM | POA: Diagnosis not present

## 2016-08-15 ENCOUNTER — Ambulatory Visit
Admission: RE | Admit: 2016-08-15 | Discharge: 2016-08-15 | Disposition: A | Discharge status: Home / Self Care | Payer: Medicare Other | Source: Ambulatory Visit | Attending: Radiation Oncology | Admitting: Radiation Oncology

## 2016-08-15 DIAGNOSIS — C211 Malignant neoplasm of anal canal: Secondary | ICD-10-CM | POA: Diagnosis not present

## 2016-08-15 DIAGNOSIS — K219 Gastro-esophageal reflux disease without esophagitis: Secondary | ICD-10-CM | POA: Diagnosis not present

## 2016-08-15 DIAGNOSIS — R110 Nausea: Secondary | ICD-10-CM | POA: Diagnosis not present

## 2016-08-15 DIAGNOSIS — G4700 Insomnia, unspecified: Secondary | ICD-10-CM | POA: Diagnosis not present

## 2016-08-15 DIAGNOSIS — C210 Malignant neoplasm of anus, unspecified: Secondary | ICD-10-CM | POA: Diagnosis not present

## 2016-08-15 DIAGNOSIS — L98499 Non-pressure chronic ulcer of skin of other sites with unspecified severity: Secondary | ICD-10-CM | POA: Diagnosis not present

## 2016-08-15 DIAGNOSIS — Z510 Encounter for antineoplastic radiation therapy: Secondary | ICD-10-CM | POA: Diagnosis not present

## 2016-08-16 ENCOUNTER — Ambulatory Visit
Admission: RE | Admit: 2016-08-16 | Discharge: 2016-08-16 | Disposition: A | Discharge status: Home / Self Care | Payer: Medicare Other | Source: Ambulatory Visit | Attending: Radiation Oncology | Admitting: Radiation Oncology

## 2016-08-16 ENCOUNTER — Encounter: Payer: Self-pay | Admitting: Radiation Oncology

## 2016-08-16 VITALS — BP 105/69 | HR 96 | Temp 98.6°F | Ht 64.0 in | Wt 251.4 lb

## 2016-08-16 DIAGNOSIS — C210 Malignant neoplasm of anus, unspecified: Secondary | ICD-10-CM

## 2016-08-16 DIAGNOSIS — C211 Malignant neoplasm of anal canal: Secondary | ICD-10-CM | POA: Diagnosis not present

## 2016-08-16 DIAGNOSIS — Z510 Encounter for antineoplastic radiation therapy: Secondary | ICD-10-CM | POA: Diagnosis not present

## 2016-08-16 NOTE — Progress Notes (Signed)
Tricia Hernandez presents for her 4th fraction of radiation to her Pelvis. She denies pain. She reports normal bowel movements through her colostomy. She also reports normal urinary symptoms. She is currently living at [facility], and is being transported by her family on Monday, Tuesday, Wednesday, and Thursday. She is being transported by the facility on Friday's. She was provided education today, and has no further questions at this time.   BP 105/69   Pulse 96   Temp 98.6 F (37 C)   Ht '5\' 4"'$  (1.626 m)   Wt 251 lb 6.4 oz (114 kg)   SpO2 97% Comment: 2 Liters  BMI 43.15 kg/m    Wt Readings from Last 3 Encounters:  08/16/16 251 lb 6.4 oz (114 kg)  08/02/16 261 lb 0.4 oz (118.4 kg)  07/22/16 245 lb (111.1 kg)

## 2016-08-16 NOTE — Progress Notes (Signed)
  Radiation Oncology         [phone]   Name: Tricia Hernandez MRN: [id number]   Date: 08/16/2016  DOB: 16-Nov-1930   Weekly Radiation Therapy Management    ICD-9-CM ICD-10-CM   1. Anal cancer ([place]) 154.3 C21.0     Current Dose: 7.2 Gy  Planned Dose:  45 Gy  Narrative The patient presents for routine under treatment assessment.  The patient has completed 4 fractions to her Pelvis. She denies pain. She reports normal bowel movements through her colostomy. She also reports normal urinary symptoms. She is currently living at [facility], and is being transported by her family on Monday through Thursday. She is being transported by the facility on Fridays. The patient is accompanied by her daughter today, who brought forms to be completed per [facility].  The patient is without complaint. Set-up films were reviewed. The chart was checked.  Physical Findings  height is '5\' 4"'$  (1.626 m) and weight is 251 lb 6.4 oz (114 kg). Her temperature is 98.6 F (37 C). Her blood pressure is 105/69 and her pulse is 96. Her oxygen saturation is 97%.  Weight essentially stable.  No significant changes. In wheelchair. Oxygen via nasal cannula.  Impression The patient is tolerating radiation.  Plan Continue treatment as planned. I filled out the necessary forms and returned them to the patient's daughter.         Sheral Apley [name], M.D.  This document serves as a record of services personally performed by Tyler Pita, MD. It was created on his behalf by Maryla Morrow, a trained medical scribe. The creation of this record is based on the scribe's personal observations and the provider's statements to them. This document has been checked and approved by the attending provider.

## 2016-08-18 NOTE — Assessment & Plan Note (Signed)
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 It is unclear but the inguinal lymphadenopathy and the lung nodules could be metastatic disease.  it could be a stage IIIa versus stage IV.  Current Treatment: XRT to anus S/P diverting colostomy  Lung Nodules:  After XRT is complete, I did not recommend any further treatment. She will undergo surveillance with periodic CT scans

## 2016-08-19 ENCOUNTER — Encounter: Payer: Self-pay | Admitting: Hematology and Oncology

## 2016-08-19 ENCOUNTER — Ambulatory Visit (HOSPITAL_BASED_OUTPATIENT_CLINIC_OR_DEPARTMENT_OTHER): Payer: Medicare Other | Admitting: Hematology and Oncology

## 2016-08-19 ENCOUNTER — Ambulatory Visit
Admission: RE | Admit: 2016-08-19 | Discharge: 2016-08-19 | Disposition: A | Discharge status: Home / Self Care | Payer: Medicare Other | Source: Ambulatory Visit | Attending: Radiation Oncology | Admitting: Radiation Oncology

## 2016-08-19 DIAGNOSIS — R911 Solitary pulmonary nodule: Secondary | ICD-10-CM

## 2016-08-19 DIAGNOSIS — C210 Malignant neoplasm of anus, unspecified: Secondary | ICD-10-CM | POA: Diagnosis not present

## 2016-08-19 DIAGNOSIS — Z510 Encounter for antineoplastic radiation therapy: Secondary | ICD-10-CM | POA: Diagnosis not present

## 2016-08-19 DIAGNOSIS — C211 Malignant neoplasm of anal canal: Secondary | ICD-10-CM | POA: Diagnosis not present

## 2016-08-19 NOTE — Addendum Note (Signed)
Encounter addended by: Heywood Footman, RN on: 08/19/2016 12:47 PM<BR>    Actions taken: Outside historical medications filed, Order list changed, Medication taking status modified, Order Reconciliation Section accessed, Home Medications modified, Sign clinical note

## 2016-08-19 NOTE — Progress Notes (Signed)
Medication reconciliation update per record provided by [facility].

## 2016-08-19 NOTE — Progress Notes (Signed)
Patient Care Team: Clinton Quant, MD as PCP - General (Internal Medicine)  DIAGNOSIS:  Encounter Diagnosis  Name Primary?  Marland Kitchen Anal cancer ([place])     SUMMARY OF ONCOLOGIC HISTORY:   Lung cancer ([place])    Anal cancer ([place])   07/25/2016 Initial Diagnosis    Perianal: Squamous cell cancer atleast 5 cm, Moderately differentiated T2N1 (lung nodules, inguinal LN unclear etiology) stage 3B vs Stage 4      08/13/2016 -  Radiation Therapy    Anal Radiation (diverting colostomy)       CHIEF COMPLIANT: Currently on radiation therapy follow-up  INTERVAL HISTORY: [name] is a 83 year old elderly lady who had anal squamous cell carcinoma who is currently undergoing radiation therapy to the anal canal. She had a diverting colostomy. She appears to be tolerating radiation fairly well up to now. She had lung nodules which previously were resected and were diagnosed with carcinoid of the lung. It is not clear if these current nodules are related to the carcinoma or if they are related to carcinoid.  REVIEW OF SYSTEMS:   Constitutional: Denies fevers, chills or abnormal weight loss Eyes: Denies blurriness of vision Ears, nose, mouth, throat, and face: Hearing loss Respiratory: Denies cough, dyspnea or wheezes Cardiovascular: Denies palpitation, chest discomfort Gastrointestinal:  Colostomy Skin: Denies abnormal skin rashes Lymphatics: Denies new lymphadenopathy or easy bruising Neurological: Generalized weakness Behavioral/Psych: Mood is stable, no new changes  Extremities: Mild lower extremity edema All other systems were reviewed with the patient and are negative.  I have reviewed the past medical history, past surgical history, social history and family history with the patient and they are unchanged from previous note.  ALLERGIES:  is allergic to aleve [naproxen sodium]; antihistamines, chlorpheniramine-type; aspirin; ciprofloxacin; codeine; penicillins; rocephin [ceftriaxone  sodium in dextrose]; sulfa antibiotics; and hydrochlorothiazide.  MEDICATIONS:  Current Outpatient Prescriptions  Medication Sig Dispense Refill  . albuterol (PROVENTIL HFA;VENTOLIN HFA) 108 (90 Base) MCG/ACT inhaler Inhale 2 puffs into the lungs every 6 (six) hours as needed for wheezing or shortness of breath.    Marland Kitchen apixaban (ELIQUIS) 5 MG TABS tablet Take 5 mg by mouth 2 (two) times daily.    Marland Kitchen atorvastatin (LIPITOR) 20 MG tablet Take 20 mg by mouth daily.    . Calcium Carbonate-Vitamin D (CALCIUM 600+D) 600-400 MG-UNIT tablet Take 2 tablets by mouth daily.    . Cholecalciferol (VITAMIN D3) 1000 units CAPS Take 1 capsule by mouth daily.    Marland Kitchen docusate sodium (COLACE) 100 MG capsule Take 100 mg by mouth daily as needed for mild constipation.    Marland Kitchen esomeprazole (NEXIUM) 40 MG capsule Take 40 mg by mouth daily at 12 noon.    . fenofibrate 160 MG tablet Take 160 mg by mouth daily.    . Ferrous Sulfate (IRON) 325 (65 Fe) MG TABS Take 1 tablet by mouth 2 (two) times daily.     . fluticasone (FLONASE) 50 MCG/ACT nasal spray Place 1 spray into both nostrils daily.    . folic acid (FOLVITE) 474 MCG tablet Take 400 mcg by mouth daily.    Marland Kitchen HYDROcodone-acetaminophen (NORCO/VICODIN) 5-325 MG tablet Take 1-2 tablets by mouth every 4 (four) hours as needed for moderate pain or severe pain. (Patient not taking: Reported on 08/19/2016) 20 tablet 0  . hydrocortisone (ANUSOL-HC) 25 MG suppository Place 25 mg rectally 2 (two) times daily.    Marland Kitchen ipratropium-albuterol (DUONEB) 0.5-2.5 (3) MG/3ML SOLN Take 3 mLs by nebulization 2 (two) times daily.    Marland Kitchen  LORazepam (ATIVAN) 0.5 MG tablet Take 1 tablet (0.5 mg total) by mouth every 8 (eight) hours as needed for anxiety. 20 tablet 0  . Melatonin 3 MG TABS Take by mouth.    . metoprolol (LOPRESSOR) 100 MG tablet Take 100 mg by mouth 2 (two) times daily.    . montelukast (SINGULAIR) 10 MG tablet Take 10 mg by mouth daily.    . Multiple Vitamin (MULTIVITAMIN WITH MINERALS)  TABS tablet Take 1 tablet by mouth daily.    Marland Kitchen omeprazole (PRILOSEC) 40 MG capsule Take 40 mg by mouth daily.    Marland Kitchen PARoxetine (PAXIL) 20 MG tablet Take 20 mg by mouth daily.    Marland Kitchen pyridOXINE (VITAMIN B-6) 100 MG tablet Take 100 mg by mouth daily.    . vitamin B-12 (CYANOCOBALAMIN) 1000 MCG tablet Take 1,000 mcg by mouth daily.     No current facility-administered medications for this visit.     PHYSICAL EXAMINATION: ECOG PERFORMANCE STATUS: 3 - Symptomatic, >50% confined to bed  Vitals:   08/19/16 1438  BP: 114/68  Pulse: 98  Resp: 18  Temp: 97.8 F (36.6 C)   Filed Weights   08/19/16 1438  Weight: 249 lb 9.6 oz (113.2 kg)   GENERAL:alert, no distress and comfortable SKIN: skin color, texture, turgor are normal, no rashes or significant lesions EYES: normal, Conjunctiva are pink and non-injected, sclera clear OROPHARYNX:no exudate, no erythema and lips, buccal mucosa, and tongue normal  NECK: supple, thyroid normal size, non-tender, without nodularity LYMPH:  no palpable lymphadenopathy in the cervical, axillary or inguinal LUNGS: clear to auscultation and percussion with normal breathing effort HEART: regular rate & rhythm and no murmurs and no lower extremity edema ABDOMEN:Colostomy MUSCULOSKELETAL:no cyanosis of digits and no clubbing  NEURO: alert & oriented x 3 with fluent speech, no focal motor/sensory deficits EXTREMITIES: 1+ lower extremity edema  LABORATORY DATA:  I have reviewed the data as listed   Chemistry      Component Value Date/Time   NA 142 08/01/2016 0317   K 4.5 08/01/2016 0317   CL 98 (L) 08/01/2016 0317   CO2 37 (H) 08/01/2016 0317   BUN 7 08/01/2016 0317   CREATININE 0.85 08/01/2016 0317      Component Value Date/Time   CALCIUM 8.7 (L) 08/01/2016 0317   ALKPHOS 50 08/01/2016 0317   AST 18 08/01/2016 0317   ALT 16 08/01/2016 0317   BILITOT 0.3 08/01/2016 0317       Lab Results  Component Value Date   WBC 8.4 07/31/2016   HGB 8.9 (L)  07/31/2016   HCT 29.7 (L) 07/31/2016   MCV 86.6 07/31/2016   PLT 202 07/31/2016   NEUTROABS 6.4 07/31/2016    ASSESSMENT & PLAN:  Anal cancer ([place]) 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 It is unclear but the inguinal lymphadenopathy and the lung nodules could be metastatic disease.  it could be a stage IIIa versus stage IV.  Current Treatment: XRT to anus S/P diverting colostomy  Lung Nodules: I will order a PET/CT scan to be done next Monday. I would like to see her back next Tuesday to review the PET CT scan. If the PET CT scan shows hypermetabolic lung nodules, Dr. [name] may not pursue full dose therapy to the anal squamous cell carcinoma. It is for this reason that we are ordering the PET CT scan at this time.  After XRT is complete, I did not recommend any  further systemic treatment.  Insomnia: Patient wanted to make sure that she gets Paxil at bedtime. I requested the nursing home to give her Paxil at bedtime.  I spent 25 minutes talking to the patient of which more than half was spent in counseling and coordination of care.  Orders Placed This Encounter  Procedures  . NM PET Image Initial (PI) Skull Base To Thigh    Standing Status:   Future    Standing Expiration Date:   08/19/2017    Scheduling Instructions:     Please schedule it after 3 PM (patient gets radiation at 2 PM)    Order Specific Question:   Reason for Exam (SYMPTOM  OR DIAGNOSIS REQUIRED)    Answer:   Anal cancer with lung nodules evaluation for metastatic disease    Order Specific Question:   Preferred imaging location?    Answer:   [facility]    Order Specific Question:   If indicated for the ordered procedure, I authorize the administration of a radiopharmaceutical per Radiology protocol    Answer:   Yes   The patient has a good understanding of the overall plan. she agrees with it. she will call with any problems that  may develop before the next visit here.   Rulon Eisenmenger, MD 08/19/16

## 2016-08-20 ENCOUNTER — Ambulatory Visit
Admission: RE | Admit: 2016-08-20 | Discharge: 2016-08-20 | Disposition: A | Discharge status: Home / Self Care | Payer: Medicare Other | Source: Ambulatory Visit | Attending: Radiation Oncology | Admitting: Radiation Oncology

## 2016-08-20 DIAGNOSIS — Z510 Encounter for antineoplastic radiation therapy: Secondary | ICD-10-CM | POA: Diagnosis not present

## 2016-08-20 DIAGNOSIS — C210 Malignant neoplasm of anus, unspecified: Secondary | ICD-10-CM | POA: Diagnosis not present

## 2016-08-20 DIAGNOSIS — C211 Malignant neoplasm of anal canal: Secondary | ICD-10-CM | POA: Diagnosis not present

## 2016-08-21 ENCOUNTER — Telehealth: Payer: Self-pay | Admitting: Hematology and Oncology

## 2016-08-21 ENCOUNTER — Ambulatory Visit
Admission: RE | Admit: 2016-08-21 | Discharge: 2016-08-21 | Disposition: A | Discharge status: Home / Self Care | Payer: Medicare Other | Source: Ambulatory Visit | Attending: Radiation Oncology | Admitting: Radiation Oncology

## 2016-08-21 DIAGNOSIS — C211 Malignant neoplasm of anal canal: Secondary | ICD-10-CM | POA: Diagnosis not present

## 2016-08-21 DIAGNOSIS — C210 Malignant neoplasm of anus, unspecified: Secondary | ICD-10-CM | POA: Diagnosis not present

## 2016-08-21 DIAGNOSIS — Z510 Encounter for antineoplastic radiation therapy: Secondary | ICD-10-CM | POA: Diagnosis not present

## 2016-08-21 NOTE — Telephone Encounter (Signed)
Appointment rescheduled per patient request. Rescheduled to follow scan appointment.

## 2016-08-22 ENCOUNTER — Ambulatory Visit
Admission: RE | Admit: 2016-08-22 | Discharge: 2016-08-22 | Disposition: A | Discharge status: Home / Self Care | Payer: Medicare Other | Source: Ambulatory Visit | Attending: Radiation Oncology | Admitting: Radiation Oncology

## 2016-08-22 DIAGNOSIS — C211 Malignant neoplasm of anal canal: Secondary | ICD-10-CM | POA: Diagnosis not present

## 2016-08-23 ENCOUNTER — Ambulatory Visit
Admission: RE | Admit: 2016-08-23 | Discharge: 2016-08-23 | Disposition: A | Discharge status: Home / Self Care | Payer: Medicare Other | Source: Ambulatory Visit | Attending: Radiation Oncology | Admitting: Radiation Oncology

## 2016-08-23 VITALS — BP 125/63 | HR 91 | Temp 99.2°F | Resp 20 | Wt 247.8 lb

## 2016-08-23 DIAGNOSIS — C210 Malignant neoplasm of anus, unspecified: Secondary | ICD-10-CM

## 2016-08-23 DIAGNOSIS — C211 Malignant neoplasm of anal canal: Secondary | ICD-10-CM | POA: Diagnosis not present

## 2016-08-23 DIAGNOSIS — L98499 Non-pressure chronic ulcer of skin of other sites with unspecified severity: Secondary | ICD-10-CM | POA: Diagnosis not present

## 2016-08-23 NOTE — Progress Notes (Signed)
Weight stable. Temperature slightly elevated. Denies pain. Hyperpigmentation below colostomy bag noted without desquamation. Reports many issue with colostomy bag resolved on Tuesday when they changed her colostomy ring to a wax ring and gave her a colostomy belt. Dark brown loose stool noted in colostomy bag without blood. Air/gas noted in colostomy bag. Reports a productive cough since Tuesday with yellow sputum. Reports black stool with blood seeps from rectum. Continues oxygen therapy two liters via nasal cannula noted. Reports occasional dysuria. Patient is taking an iron supplement. Reports one night this week rectal spasms were so painful she couldn't sleep and didn't participate in PT the next day.   BP 125/63 (BP Location: Left Arm, Patient Position: Sitting, Cuff Size: Normal)   Pulse 91   Temp 99.2 F (37.3 C) (Oral)   Resp 20   Wt 247 lb 12.8 oz (112.4 kg)   SpO2 94%   BMI 42.53 kg/m  Wt Readings from Last 3 Encounters:  08/23/16 247 lb 12.8 oz (112.4 kg)  08/19/16 249 lb 9.6 oz (113.2 kg)  08/16/16 251 lb 6.4 oz (114 kg)

## 2016-08-23 NOTE — Progress Notes (Signed)
  Radiation Oncology         [phone]   Name: Tricia Hernandez MRN: [id number]   Date: 08/23/2016  DOB: 09/25/1930   Weekly Radiation Therapy Management    ICD-9-CM ICD-10-CM   1. Anal cancer ([place]) 154.3 C21.0     Current Dose: 16.2 Gy  Planned Dose:  45 Gy  Narrative The patient presents for routine under treatment assessment.  Weight stable. Temperature slightly elevated. Denies pain. Nursing notes, hyperpigmentation below colostomy bag without desquamation. Reports many issues with colostomy bag resolved on Tuesday when they changed her colostomy ring to a wax ring and gave her a colostomy belt. Dark brown loose stool noted in colostomy bag without blood. Air/gas noted in colostomy bag. Reports watery stool. Reports a productive cough since Tuesday with dark yellow sputum. She reports she has bronchitis. The highest fever she has had at home has been 99 F. She denies hemoptysis. She drinks Gatorade regularly. Reports black stool with blood seeps from rectum. Continues oxygen therapy two liters via nasal cannula noted. Reports occasional dysuria. Patient is taking an iron supplement. Reports one night this week rectal spasms were so painful she couldn't sleep and didn't participate in PT the next day.   Set-up films were reviewed. The chart was checked.  Physical Findings  weight is 247 lb 12.8 oz (112.4 kg). Her oral temperature is 99.2 F (37.3 C). Her blood pressure is 125/63 and her pulse is 91. Her respiration is 20 and oxygen saturation is 94%.  Weight essentially stable.  No significant changes. In wheelchair. Oxygen via nasal cannula.   Impression The patient is tolerating radiation. I have encouraged the patient to increase fluid intake and rest over the weekend to hopefully help with current sputum production.  Plan Continue treatment as planned.      Sheral Apley [name], M.D.  This document serves as a record of services personally performed by Tyler Pita, MD. It was  created on his behalf by Arlyce Harman, a trained medical scribe. The creation of this record is based on the scribe's personal observations and the provider's statements to them. This document has been checked and approved by the attending provider.

## 2016-08-25 ENCOUNTER — Inpatient Hospital Stay (HOSPITAL_COMMUNITY)
Admission: EM | Admit: 2016-08-25 | Discharge: 2016-09-02 | DRG: 193 | Disposition: A | Discharge status: Skilled Nursing Facility | Payer: Medicare Other | Attending: Internal Medicine | Admitting: Internal Medicine

## 2016-08-25 ENCOUNTER — Encounter (HOSPITAL_COMMUNITY): Payer: Self-pay | Admitting: Nurse Practitioner

## 2016-08-25 DIAGNOSIS — Z888 Allergy status to other drugs, medicaments and biological substances status: Secondary | ICD-10-CM

## 2016-08-25 DIAGNOSIS — Y95 Nosocomial condition: Secondary | ICD-10-CM | POA: Diagnosis present

## 2016-08-25 DIAGNOSIS — R748 Abnormal levels of other serum enzymes: Secondary | ICD-10-CM | POA: Diagnosis present

## 2016-08-25 DIAGNOSIS — Z881 Allergy status to other antibiotic agents status: Secondary | ICD-10-CM

## 2016-08-25 DIAGNOSIS — J189 Pneumonia, unspecified organism: Secondary | ICD-10-CM | POA: Diagnosis not present

## 2016-08-25 DIAGNOSIS — Z933 Colostomy status: Secondary | ICD-10-CM

## 2016-08-25 DIAGNOSIS — R778 Other specified abnormalities of plasma proteins: Secondary | ICD-10-CM | POA: Diagnosis present

## 2016-08-25 DIAGNOSIS — Z823 Family history of stroke: Secondary | ICD-10-CM

## 2016-08-25 DIAGNOSIS — I4891 Unspecified atrial fibrillation: Secondary | ICD-10-CM | POA: Diagnosis present

## 2016-08-25 DIAGNOSIS — Z8049 Family history of malignant neoplasm of other genital organs: Secondary | ICD-10-CM

## 2016-08-25 DIAGNOSIS — K219 Gastro-esophageal reflux disease without esophagitis: Secondary | ICD-10-CM | POA: Diagnosis present

## 2016-08-25 DIAGNOSIS — F419 Anxiety disorder, unspecified: Secondary | ICD-10-CM | POA: Diagnosis present

## 2016-08-25 DIAGNOSIS — R0602 Shortness of breath: Secondary | ICD-10-CM | POA: Diagnosis not present

## 2016-08-25 DIAGNOSIS — Z7901 Long term (current) use of anticoagulants: Secondary | ICD-10-CM

## 2016-08-25 DIAGNOSIS — Z79899 Other long term (current) drug therapy: Secondary | ICD-10-CM

## 2016-08-25 DIAGNOSIS — C210 Malignant neoplasm of anus, unspecified: Secondary | ICD-10-CM | POA: Diagnosis present

## 2016-08-25 DIAGNOSIS — R410 Disorientation, unspecified: Secondary | ICD-10-CM | POA: Diagnosis not present

## 2016-08-25 DIAGNOSIS — Z7951 Long term (current) use of inhaled steroids: Secondary | ICD-10-CM

## 2016-08-25 DIAGNOSIS — J9601 Acute respiratory failure with hypoxia: Secondary | ICD-10-CM | POA: Diagnosis present

## 2016-08-25 DIAGNOSIS — Z886 Allergy status to analgesic agent status: Secondary | ICD-10-CM

## 2016-08-25 DIAGNOSIS — Z880 Allergy status to penicillin: Secondary | ICD-10-CM

## 2016-08-25 DIAGNOSIS — R0600 Dyspnea, unspecified: Secondary | ICD-10-CM

## 2016-08-25 DIAGNOSIS — I471 Supraventricular tachycardia: Secondary | ICD-10-CM | POA: Diagnosis present

## 2016-08-25 DIAGNOSIS — Z882 Allergy status to sulfonamides status: Secondary | ICD-10-CM

## 2016-08-25 DIAGNOSIS — D696 Thrombocytopenia, unspecified: Secondary | ICD-10-CM | POA: Diagnosis not present

## 2016-08-25 DIAGNOSIS — R05 Cough: Secondary | ICD-10-CM | POA: Diagnosis not present

## 2016-08-25 DIAGNOSIS — D649 Anemia, unspecified: Secondary | ICD-10-CM | POA: Diagnosis present

## 2016-08-25 DIAGNOSIS — Z807 Family history of other malignant neoplasms of lymphoid, hematopoietic and related tissues: Secondary | ICD-10-CM

## 2016-08-25 DIAGNOSIS — Z885 Allergy status to narcotic agent status: Secondary | ICD-10-CM

## 2016-08-25 DIAGNOSIS — R7989 Other specified abnormal findings of blood chemistry: Secondary | ICD-10-CM | POA: Diagnosis present

## 2016-08-25 DIAGNOSIS — B379 Candidiasis, unspecified: Secondary | ICD-10-CM

## 2016-08-25 DIAGNOSIS — Z806 Family history of leukemia: Secondary | ICD-10-CM

## 2016-08-25 DIAGNOSIS — N6019 Diffuse cystic mastopathy of unspecified breast: Secondary | ICD-10-CM | POA: Diagnosis present

## 2016-08-25 DIAGNOSIS — Z808 Family history of malignant neoplasm of other organs or systems: Secondary | ICD-10-CM

## 2016-08-25 DIAGNOSIS — Z85828 Personal history of other malignant neoplasm of skin: Secondary | ICD-10-CM

## 2016-08-25 DIAGNOSIS — G319 Degenerative disease of nervous system, unspecified: Secondary | ICD-10-CM | POA: Diagnosis present

## 2016-08-25 DIAGNOSIS — B373 Candidiasis of vulva and vagina: Secondary | ICD-10-CM | POA: Diagnosis present

## 2016-08-25 DIAGNOSIS — E785 Hyperlipidemia, unspecified: Secondary | ICD-10-CM | POA: Diagnosis present

## 2016-08-25 DIAGNOSIS — Z6841 Body Mass Index (BMI) 40.0 and over, adult: Secondary | ICD-10-CM

## 2016-08-25 DIAGNOSIS — I10 Essential (primary) hypertension: Secondary | ICD-10-CM | POA: Diagnosis present

## 2016-08-25 DIAGNOSIS — Z85118 Personal history of other malignant neoplasm of bronchus and lung: Secondary | ICD-10-CM

## 2016-08-25 DIAGNOSIS — I2699 Other pulmonary embolism without acute cor pulmonale: Secondary | ICD-10-CM | POA: Diagnosis present

## 2016-08-25 DIAGNOSIS — Z86711 Personal history of pulmonary embolism: Secondary | ICD-10-CM

## 2016-08-25 DIAGNOSIS — Z8619 Personal history of other infectious and parasitic diseases: Secondary | ICD-10-CM

## 2016-08-25 DIAGNOSIS — Z8249 Family history of ischemic heart disease and other diseases of the circulatory system: Secondary | ICD-10-CM

## 2016-08-25 DIAGNOSIS — E559 Vitamin D deficiency, unspecified: Secondary | ICD-10-CM | POA: Diagnosis present

## 2016-08-25 DIAGNOSIS — Z803 Family history of malignant neoplasm of breast: Secondary | ICD-10-CM

## 2016-08-25 DIAGNOSIS — H9190 Unspecified hearing loss, unspecified ear: Secondary | ICD-10-CM | POA: Diagnosis present

## 2016-08-25 DIAGNOSIS — J069 Acute upper respiratory infection, unspecified: Secondary | ICD-10-CM | POA: Diagnosis present

## 2016-08-25 DIAGNOSIS — Z87891 Personal history of nicotine dependence: Secondary | ICD-10-CM

## 2016-08-25 DIAGNOSIS — F329 Major depressive disorder, single episode, unspecified: Secondary | ICD-10-CM | POA: Diagnosis present

## 2016-08-25 NOTE — ED Triage Notes (Signed)
Pt brought in via EMS from [facility] with increased cough, congestion, and wheezing. Per EMS on arrival bp 148 82, HR 100 irrgegular, 24 resp, 84% RA. Patient was given 5 mg Albuterol nebulizer and started on 4L/[place] were she maintained saturations 95-98%. Pt has known URI per EMS and staff at SNF. She is a full code and has hx of a-fibb. She is at facility due to new diagnosis of stomach cancer and s/p colostomy surgery. Pt is very hard of hearing.

## 2016-08-25 NOTE — ED Notes (Signed)
Bed: KV42 Expected date:  Expected time:  Means of arrival:  Comments: 83 yo F/ Shortness of breath

## 2016-08-26 ENCOUNTER — Emergency Department (HOSPITAL_COMMUNITY): Payer: Medicare Other

## 2016-08-26 ENCOUNTER — Ambulatory Visit: Payer: Medicare Other

## 2016-08-26 ENCOUNTER — Encounter (HOSPITAL_COMMUNITY): Payer: Self-pay | Admitting: Internal Medicine

## 2016-08-26 DIAGNOSIS — R748 Abnormal levels of other serum enzymes: Secondary | ICD-10-CM

## 2016-08-26 DIAGNOSIS — C210 Malignant neoplasm of anus, unspecified: Secondary | ICD-10-CM | POA: Diagnosis not present

## 2016-08-26 DIAGNOSIS — Z933 Colostomy status: Secondary | ICD-10-CM | POA: Diagnosis not present

## 2016-08-26 DIAGNOSIS — J9601 Acute respiratory failure with hypoxia: Secondary | ICD-10-CM | POA: Diagnosis not present

## 2016-08-26 DIAGNOSIS — R911 Solitary pulmonary nodule: Secondary | ICD-10-CM | POA: Diagnosis not present

## 2016-08-26 DIAGNOSIS — F329 Major depressive disorder, single episode, unspecified: Secondary | ICD-10-CM | POA: Diagnosis present

## 2016-08-26 DIAGNOSIS — E785 Hyperlipidemia, unspecified: Secondary | ICD-10-CM | POA: Diagnosis present

## 2016-08-26 DIAGNOSIS — J189 Pneumonia, unspecified organism: Secondary | ICD-10-CM | POA: Diagnosis present

## 2016-08-26 DIAGNOSIS — R531 Weakness: Secondary | ICD-10-CM | POA: Diagnosis not present

## 2016-08-26 DIAGNOSIS — I4891 Unspecified atrial fibrillation: Secondary | ICD-10-CM | POA: Diagnosis present

## 2016-08-26 DIAGNOSIS — Z85118 Personal history of other malignant neoplasm of bronchus and lung: Secondary | ICD-10-CM | POA: Diagnosis not present

## 2016-08-26 DIAGNOSIS — N6019 Diffuse cystic mastopathy of unspecified breast: Secondary | ICD-10-CM | POA: Diagnosis present

## 2016-08-26 DIAGNOSIS — R410 Disorientation, unspecified: Secondary | ICD-10-CM | POA: Diagnosis not present

## 2016-08-26 DIAGNOSIS — F339 Major depressive disorder, recurrent, unspecified: Secondary | ICD-10-CM | POA: Diagnosis not present

## 2016-08-26 DIAGNOSIS — D649 Anemia, unspecified: Secondary | ICD-10-CM | POA: Diagnosis present

## 2016-08-26 DIAGNOSIS — K6289 Other specified diseases of anus and rectum: Secondary | ICD-10-CM | POA: Diagnosis not present

## 2016-08-26 DIAGNOSIS — Y95 Nosocomial condition: Secondary | ICD-10-CM | POA: Diagnosis present

## 2016-08-26 DIAGNOSIS — H9190 Unspecified hearing loss, unspecified ear: Secondary | ICD-10-CM | POA: Diagnosis present

## 2016-08-26 DIAGNOSIS — I2699 Other pulmonary embolism without acute cor pulmonale: Secondary | ICD-10-CM

## 2016-08-26 DIAGNOSIS — K219 Gastro-esophageal reflux disease without esophagitis: Secondary | ICD-10-CM | POA: Diagnosis present

## 2016-08-26 DIAGNOSIS — C269 Malignant neoplasm of ill-defined sites within the digestive system: Secondary | ICD-10-CM | POA: Diagnosis not present

## 2016-08-26 DIAGNOSIS — R0600 Dyspnea, unspecified: Secondary | ICD-10-CM | POA: Diagnosis not present

## 2016-08-26 DIAGNOSIS — D696 Thrombocytopenia, unspecified: Secondary | ICD-10-CM | POA: Diagnosis present

## 2016-08-26 DIAGNOSIS — E559 Vitamin D deficiency, unspecified: Secondary | ICD-10-CM | POA: Diagnosis present

## 2016-08-26 DIAGNOSIS — I471 Supraventricular tachycardia: Secondary | ICD-10-CM | POA: Diagnosis present

## 2016-08-26 DIAGNOSIS — I10 Essential (primary) hypertension: Secondary | ICD-10-CM

## 2016-08-26 DIAGNOSIS — M6281 Muscle weakness (generalized): Secondary | ICD-10-CM | POA: Diagnosis not present

## 2016-08-26 DIAGNOSIS — J80 Acute respiratory distress syndrome: Secondary | ICD-10-CM | POA: Diagnosis not present

## 2016-08-26 DIAGNOSIS — F419 Anxiety disorder, unspecified: Secondary | ICD-10-CM | POA: Diagnosis present

## 2016-08-26 DIAGNOSIS — E6609 Other obesity due to excess calories: Secondary | ICD-10-CM | POA: Diagnosis not present

## 2016-08-26 DIAGNOSIS — I2782 Chronic pulmonary embolism: Secondary | ICD-10-CM | POA: Diagnosis not present

## 2016-08-26 DIAGNOSIS — R0602 Shortness of breath: Secondary | ICD-10-CM | POA: Diagnosis not present

## 2016-08-26 DIAGNOSIS — I5032 Chronic diastolic (congestive) heart failure: Secondary | ICD-10-CM | POA: Diagnosis not present

## 2016-08-26 DIAGNOSIS — J069 Acute upper respiratory infection, unspecified: Secondary | ICD-10-CM | POA: Diagnosis present

## 2016-08-26 DIAGNOSIS — B373 Candidiasis of vulva and vagina: Secondary | ICD-10-CM | POA: Diagnosis present

## 2016-08-26 DIAGNOSIS — C211 Malignant neoplasm of anal canal: Secondary | ICD-10-CM | POA: Diagnosis not present

## 2016-08-26 DIAGNOSIS — Z6841 Body Mass Index (BMI) 40.0 and over, adult: Secondary | ICD-10-CM | POA: Diagnosis not present

## 2016-08-26 DIAGNOSIS — E784 Other hyperlipidemia: Secondary | ICD-10-CM | POA: Diagnosis not present

## 2016-08-26 DIAGNOSIS — I482 Chronic atrial fibrillation: Secondary | ICD-10-CM | POA: Diagnosis not present

## 2016-08-26 DIAGNOSIS — R05 Cough: Secondary | ICD-10-CM | POA: Diagnosis not present

## 2016-08-26 DIAGNOSIS — R2689 Other abnormalities of gait and mobility: Secondary | ICD-10-CM | POA: Diagnosis not present

## 2016-08-26 DIAGNOSIS — I481 Persistent atrial fibrillation: Secondary | ICD-10-CM

## 2016-08-26 DIAGNOSIS — C3491 Malignant neoplasm of unspecified part of right bronchus or lung: Secondary | ICD-10-CM | POA: Diagnosis not present

## 2016-08-26 DIAGNOSIS — G319 Degenerative disease of nervous system, unspecified: Secondary | ICD-10-CM | POA: Diagnosis present

## 2016-08-26 LAB — CBC WITH DIFFERENTIAL/PLATELET
BASOS PCT: 0 %
BASOS PCT: 0 %
Basophils Absolute: 0 10*3/uL (ref 0.0–0.1)
Basophils Absolute: 0 10*3/uL (ref 0.0–0.1)
EOS ABS: 0.3 10*3/uL (ref 0.0–0.7)
EOS ABS: 0.3 10*3/uL (ref 0.0–0.7)
EOS PCT: 6 %
Eosinophils Relative: 5 %
HCT: 34.4 % — ABNORMAL LOW (ref 36.0–46.0)
HCT: 34.2 % — ABNORMAL LOW (ref 36.0–46.0)
HEMOGLOBIN: 10 g/dL — AB (ref 12.0–15.0)
Hemoglobin: 9.8 g/dL — ABNORMAL LOW (ref 12.0–15.0)
LYMPHS ABS: 0.5 10*3/uL — AB (ref 0.7–4.0)
Lymphocytes Relative: 9 %
Lymphocytes Relative: 8 %
Lymphs Abs: 0.5 10*3/uL — ABNORMAL LOW (ref 0.7–4.0)
MCH: 24.7 pg — AB (ref 26.0–34.0)
MCH: 25.3 pg — ABNORMAL LOW (ref 26.0–34.0)
MCHC: 28.5 g/dL — AB (ref 30.0–36.0)
MCHC: 29.2 g/dL — ABNORMAL LOW (ref 30.0–36.0)
MCV: 86.6 fL (ref 78.0–100.0)
MCV: 86.6 fL (ref 78.0–100.0)
MONO ABS: 0.5 10*3/uL (ref 0.1–1.0)
MONO ABS: 0.7 10*3/uL (ref 0.1–1.0)
MONOS PCT: 9 %
Monocytes Relative: 12 %
NEUTROS PCT: 75 %
Neutro Abs: 4.4 10*3/uL (ref 1.7–7.7)
Neutro Abs: 4.3 10*3/uL (ref 1.7–7.7)
Neutrophils Relative %: 76 %
Platelets: 121 10*3/uL — ABNORMAL LOW (ref 150–400)
Platelets: 109 10*3/uL — ABNORMAL LOW (ref 150–400)
RBC: 3.97 MIL/uL (ref 3.87–5.11)
RBC: 3.95 MIL/uL (ref 3.87–5.11)
RDW: 15.1 % (ref 11.5–15.5)
RDW: 15.3 % (ref 11.5–15.5)
WBC: 5.7 10*3/uL (ref 4.0–10.5)
WBC: 5.8 10*3/uL (ref 4.0–10.5)

## 2016-08-26 LAB — COMPREHENSIVE METABOLIC PANEL
ALK PHOS: 58 U/L (ref 38–126)
ALT: 23 U/L (ref 14–54)
AST: 34 U/L (ref 15–41)
Albumin: 3.1 g/dL — ABNORMAL LOW (ref 3.5–5.0)
Anion gap: 8 (ref 5–15)
BILIRUBIN TOTAL: 0.4 mg/dL (ref 0.3–1.2)
BUN: 13 mg/dL (ref 6–20)
CALCIUM: 8.7 mg/dL — AB (ref 8.9–10.3)
CO2: 38 mmol/L — ABNORMAL HIGH (ref 22–32)
CREATININE: 0.67 mg/dL (ref 0.44–1.00)
Chloride: 93 mmol/L — ABNORMAL LOW (ref 101–111)
Glucose, Bld: 119 mg/dL — ABNORMAL HIGH (ref 65–99)
Potassium: 5.1 mmol/L (ref 3.5–5.1)
Sodium: 139 mmol/L (ref 135–145)
TOTAL PROTEIN: 6.6 g/dL (ref 6.5–8.1)

## 2016-08-26 LAB — TROPONIN I
TROPONIN I: 0.15 ng/mL — AB (ref <0.03)
Troponin I: 0.16 ng/mL (ref <0.03)
Troponin I: 0.16 ng/mL (ref <0.03)
Troponin I: 0.14 ng/mL (ref <0.03)

## 2016-08-26 LAB — BASIC METABOLIC PANEL
Anion gap: 5 (ref 5–15)
BUN: 14 mg/dL (ref 6–20)
CALCIUM: 9 mg/dL (ref 8.9–10.3)
CO2: 39 mmol/L — ABNORMAL HIGH (ref 22–32)
CREATININE: 0.64 mg/dL (ref 0.44–1.00)
Chloride: 94 mmol/L — ABNORMAL LOW (ref 101–111)
GFR calc non Af Amer: >60 mL/min (ref >60)
Glucose, Bld: 148 mg/dL — ABNORMAL HIGH (ref 65–99)
Potassium: 4.7 mmol/L (ref 3.5–5.1)
SODIUM: 138 mmol/L (ref 135–145)

## 2016-08-26 LAB — INFLUENZA PANEL BY PCR (TYPE A & B)
INFLAPCR: NEGATIVE
INFLBPCR: NEGATIVE

## 2016-08-26 LAB — I-STAT CG4 LACTIC ACID, ED: LACTIC ACID, VENOUS: 0.91 mmol/L (ref 0.5–1.9)

## 2016-08-26 LAB — BRAIN NATRIURETIC PEPTIDE: B NATRIURETIC PEPTIDE 5: 298 pg/mL — AB (ref 0.0–100.0)

## 2016-08-26 LAB — MRSA PCR SCREENING: MRSA by PCR: NEGATIVE

## 2016-08-26 MED ORDER — VITAMIN B-12 1000 MCG PO TABS
1000.0000 ug | ORAL_TABLET | Freq: Every day | ORAL | Status: DC
  Administered 2016-08-26 – 2016-09-02 (×8): 1000 ug via ORAL
  Filled 2016-08-26 (×8): qty 1

## 2016-08-26 MED ORDER — VANCOMYCIN HCL 10 G IV SOLR
1250.0000 mg | INTRAVENOUS | Status: DC
  Administered 2016-08-27 – 2016-08-29 (×3): 1250 mg via INTRAVENOUS
  Filled 2016-08-26 (×3): qty 1250

## 2016-08-26 MED ORDER — FOLIC ACID 1 MG PO TABS
500.0000 ug | ORAL_TABLET | Freq: Every day | ORAL | Status: DC
Start: 2016-08-26 — End: 2016-09-02
  Administered 2016-08-26 – 2016-09-02 (×9): 0.5 mg via ORAL
  Filled 2016-08-26 (×8): qty 1

## 2016-08-26 MED ORDER — VITAMIN D3 25 MCG (1000 UNIT) PO TABS
1000.0000 [IU] | ORAL_TABLET | Freq: Every day | ORAL | Status: DC
  Administered 2016-08-26 – 2016-09-02 (×8): 1000 [IU] via ORAL
  Filled 2016-08-26 (×8): qty 1

## 2016-08-26 MED ORDER — MONTELUKAST SODIUM 10 MG PO TABS
10.0000 mg | ORAL_TABLET | Freq: Every day | ORAL | Status: DC
  Administered 2016-08-26 – 2016-09-02 (×8): 10 mg via ORAL
  Filled 2016-08-26 (×8): qty 1

## 2016-08-26 MED ORDER — VANCOMYCIN HCL IN DEXTROSE 1-5 GM/200ML-% IV SOLN: 1000.0000 mg | Freq: Once | INTRAVENOUS | Status: DC

## 2016-08-26 MED ORDER — FLUTICASONE PROPIONATE 50 MCG/ACT NA SUSP
1.0000 | Freq: Every day | NASAL | Status: DC
  Administered 2016-08-26 – 2016-09-02 (×8): 1 via NASAL
  Filled 2016-08-26: qty 16

## 2016-08-26 MED ORDER — DEXTROSE 5 % IV SOLN
2.0000 g | Freq: Once | INTRAVENOUS | Status: DC
  Filled 2016-08-26: qty 2

## 2016-08-26 MED ORDER — AZTREONAM IN DEXTROSE 2 GM/50ML IV SOLN
2.0000 g | Freq: Three times a day (TID) | INTRAVENOUS | Status: DC
  Filled 2016-08-26: qty 50

## 2016-08-26 MED ORDER — IPRATROPIUM-ALBUTEROL 0.5-2.5 (3) MG/3ML IN SOLN
3.0000 mL | RESPIRATORY_TRACT | Status: DC | PRN
  Administered 2016-08-27 – 2016-08-28 (×2): 3 mL via RESPIRATORY_TRACT
  Filled 2016-08-26 (×3): qty 3

## 2016-08-26 MED ORDER — ONDANSETRON HCL 4 MG/2ML IJ SOLN
4.0000 mg | Freq: Four times a day (QID) | INTRAMUSCULAR | Status: DC | PRN
  Administered 2016-08-26: 4 mg via INTRAVENOUS
  Filled 2016-08-26: qty 2

## 2016-08-26 MED ORDER — APIXABAN 5 MG PO TABS
5.0000 mg | ORAL_TABLET | Freq: Two times a day (BID) | ORAL | Status: DC
  Administered 2016-08-26 – 2016-09-02 (×16): 5 mg via ORAL
  Filled 2016-08-26 (×16): qty 1

## 2016-08-26 MED ORDER — VANCOMYCIN HCL 10 G IV SOLR
2000.0000 mg | Freq: Once | INTRAVENOUS | Status: AC
  Administered 2016-08-26: 2000 mg via INTRAVENOUS
  Filled 2016-08-26: qty 2000

## 2016-08-26 MED ORDER — PAROXETINE HCL 20 MG PO TABS
20.0000 mg | ORAL_TABLET | Freq: Every day | ORAL | Status: DC
  Administered 2016-08-26 – 2016-09-01 (×7): 20 mg via ORAL
  Filled 2016-08-26 (×7): qty 1

## 2016-08-26 MED ORDER — BUDESONIDE 0.25 MG/2ML IN SUSP
0.2500 mg | Freq: Two times a day (BID) | RESPIRATORY_TRACT | Status: DC
  Administered 2016-08-26 – 2016-09-02 (×15): 0.25 mg via RESPIRATORY_TRACT
  Filled 2016-08-26 (×15): qty 2

## 2016-08-26 MED ORDER — CALCIUM CARBONATE-VITAMIN D 500-200 MG-UNIT PO TABS
1.0000 | ORAL_TABLET | Freq: Every day | ORAL | Status: DC
  Administered 2016-08-26 – 2016-09-02 (×8): 1 via ORAL
  Filled 2016-08-26 (×8): qty 1

## 2016-08-26 MED ORDER — SODIUM CHLORIDE 0.9 % IV SOLN
1000.0000 mL | INTRAVENOUS | Status: DC
  Administered 2016-08-26: 1000 mL via INTRAVENOUS

## 2016-08-26 MED ORDER — HYDROCODONE-ACETAMINOPHEN 5-325 MG PO TABS
1.0000 | ORAL_TABLET | ORAL | Status: DC | PRN
  Administered 2016-08-28: 1 via ORAL
  Filled 2016-08-26: qty 1

## 2016-08-26 MED ORDER — PANTOPRAZOLE SODIUM 40 MG PO TBEC
40.0000 mg | DELAYED_RELEASE_TABLET | Freq: Every day | ORAL | Status: DC
  Administered 2016-08-26 – 2016-09-02 (×8): 40 mg via ORAL
  Filled 2016-08-26 (×8): qty 1

## 2016-08-26 MED ORDER — VITAMIN B-6 100 MG PO TABS
100.0000 mg | ORAL_TABLET | Freq: Every day | ORAL | Status: DC
  Administered 2016-08-26 – 2016-09-02 (×8): 100 mg via ORAL
  Filled 2016-08-26 (×8): qty 1

## 2016-08-26 MED ORDER — ATORVASTATIN CALCIUM 10 MG PO TABS
20.0000 mg | ORAL_TABLET | Freq: Every day | ORAL | Status: DC
  Administered 2016-08-26 – 2016-09-01 (×6): 20 mg via ORAL
  Filled 2016-08-26 (×6): qty 2

## 2016-08-26 MED ORDER — HYDROCORTISONE ACETATE 25 MG RE SUPP
25.0000 mg | Freq: Every day | RECTAL | Status: DC
  Administered 2016-08-26 – 2016-08-31 (×4): 25 mg via RECTAL
  Filled 2016-08-26 (×7): qty 1

## 2016-08-26 MED ORDER — IPRATROPIUM-ALBUTEROL 0.5-2.5 (3) MG/3ML IN SOLN
3.0000 mL | Freq: Three times a day (TID) | RESPIRATORY_TRACT | Status: DC
  Administered 2016-08-26 – 2016-09-02 (×20): 3 mL via RESPIRATORY_TRACT
  Filled 2016-08-26 (×21): qty 3

## 2016-08-26 MED ORDER — SODIUM CHLORIDE 0.9 % IV SOLN
INTRAVENOUS | Status: AC
  Administered 2016-08-26 (×3): via INTRAVENOUS

## 2016-08-26 MED ORDER — ONDANSETRON HCL 4 MG PO TABS: 4.0000 mg | ORAL_TABLET | Freq: Four times a day (QID) | ORAL | Status: DC | PRN

## 2016-08-26 MED ORDER — ENSURE ENLIVE PO LIQD
237.0000 mL | Freq: Three times a day (TID) | ORAL | Status: DC
  Administered 2016-08-26 – 2016-09-02 (×18): 237 mL via ORAL

## 2016-08-26 MED ORDER — METOPROLOL TARTRATE 50 MG PO TABS
100.0000 mg | ORAL_TABLET | Freq: Two times a day (BID) | ORAL | Status: DC
  Administered 2016-08-26 – 2016-09-02 (×16): 100 mg via ORAL
  Filled 2016-08-26 (×16): qty 2

## 2016-08-26 MED ORDER — IPRATROPIUM-ALBUTEROL 0.5-2.5 (3) MG/3ML IN SOLN
3.0000 mL | RESPIRATORY_TRACT | Status: DC
  Administered 2016-08-26 (×2): 3 mL via RESPIRATORY_TRACT
  Filled 2016-08-26 (×2): qty 3

## 2016-08-26 MED ORDER — AZTREONAM IN DEXTROSE 2 GM/50ML IV SOLN
2.0000 g | Freq: Three times a day (TID) | INTRAVENOUS | Status: DC
  Administered 2016-08-26 – 2016-09-02 (×23): 2 g via INTRAVENOUS
  Filled 2016-08-26 (×26): qty 50

## 2016-08-26 MED ORDER — INFLUENZA VAC SPLIT QUAD 0.5 ML IM SUSY: 0.5000 mL | PREFILLED_SYRINGE | INTRAMUSCULAR | Status: DC

## 2016-08-26 MED ORDER — FENOFIBRATE 160 MG PO TABS
160.0000 mg | ORAL_TABLET | Freq: Every day | ORAL | Status: DC
  Administered 2016-08-26 – 2016-09-02 (×8): 160 mg via ORAL
  Filled 2016-08-26 (×8): qty 1

## 2016-08-26 MED ORDER — ADULT MULTIVITAMIN W/MINERALS CH
1.0000 | ORAL_TABLET | Freq: Every day | ORAL | Status: DC
  Administered 2016-08-26 – 2016-09-02 (×8): 1 via ORAL
  Filled 2016-08-26 (×8): qty 1

## 2016-08-26 MED ORDER — ONDANSETRON HCL 4 MG PO TABS: 8.0000 mg | ORAL_TABLET | Freq: Four times a day (QID) | ORAL | Status: DC | PRN

## 2016-08-26 MED ORDER — ACETAMINOPHEN 650 MG RE SUPP: 650.0000 mg | Freq: Four times a day (QID) | RECTAL | Status: DC | PRN

## 2016-08-26 MED ORDER — ACETAMINOPHEN 325 MG PO TABS
650.0000 mg | ORAL_TABLET | Freq: Four times a day (QID) | ORAL | Status: DC | PRN
  Administered 2016-08-31 (×2): 650 mg via ORAL
  Filled 2016-08-26 (×2): qty 2

## 2016-08-26 MED ORDER — FERROUS SULFATE 325 (65 FE) MG PO TABS
325.0000 mg | ORAL_TABLET | Freq: Two times a day (BID) | ORAL | Status: DC
  Administered 2016-08-26 – 2016-09-02 (×14): 325 mg via ORAL
  Filled 2016-08-26 (×14): qty 1

## 2016-08-26 NOTE — H&P (Signed)
History and Physical    Tricia Hernandez BSJ:[id number] DOB: 1931/03/03 DOA: 08/25/2016  PCP: Clinton Quant, MD  Patient coming from: Skilled nursing facility.  Chief Complaint: Shortness of breath.  HPI: Tricia Hernandez is a 83 y.o. female with recently diagnosed anal cancer on radiation therapy and has had recent diverting colostomy was brought to the ER after patient was having persistent shortness of breath with subjective feeling of fever and chills and nausea. Patient's symptoms have been ongoing for last 6 days. In the ER chest x-ray shows opacity concerning for pneumonia. In the ER patient has required at least 4 L oxygen to maintain sats. Abdomen on exam appears benign. Patient is being admitted for IV antibiotics and further management.   ED Course: Patient was started on IV antibiotics for healthcare associated pneumonia. Chest x-ray shows opacity concerning for pneumonia.  Review of Systems: As per HPI, rest all negative.   Past Medical History:  Diagnosis Date  . Anxiety   . Atrial fibrillation ([place])   . Chronic bronchitis ([place])   . Chronic depression   . Diverticular disease   . Fibrocystic disease of breast   . Fracture of shoulder   . GERD (gastroesophageal reflux disease)   . Herpes zoster   . Hyperlipidemia   . Hypertension   . Labyrinthitis   . Lung cancer ([place])   . Obesity   . Paroxysmal supraventricular tachycardia ([place])   . Pulmonary embolus ([place])   . Vitamin D deficiency     Past Surgical History:  Procedure Laterality Date  . APPENDECTOMY    . BASAL CELL CARCINOMA EXCISION     nose  . breast cyst removal     right  . CATARACT EXTRACTION Bilateral   . CHOLECYSTECTOMY    . COLOSTOMY N/A 07/29/2016   Procedure: COLOSTOMY;  Surgeon: Clovis Riley, MD;  Location: [place];  Service: General;  Laterality: N/A;  . EVALUATION UNDER ANESTHESIA WITH HEMORRHOIDECTOMY AND PROCTOSCOPY N/A 07/25/2016   Procedure: EXAM UNDER ANESTHESIA, EXCISION PERIANAL  MASS.;  Surgeon: Judeth Horn, MD;  Location: [place];  Service: General;  Laterality: N/A;  Prone position  . LAPAROSCOPIC DIVERTED COLOSTOMY N/A 07/29/2016   Procedure: ATTEMPTED LAPAROSCOPIC ASSISTED COLOSTOMY;  Surgeon: Clovis Riley, MD;  Location: [place];  Service: General;  Laterality: N/A;  . LUNG REMOVAL, PARTIAL  2013   RML mass/carcinoid, Dr Lurena Nida  . TONSILLECTOMY AND ADENOIDECTOMY    . TOTAL VAGINAL HYSTERECTOMY       reports that she quit smoking about 42 years ago. She has never used smokeless tobacco. She reports that she does not drink alcohol or use drugs.  Allergies  Allergen Reactions  . Aleve [Naproxen Sodium] Hives  . Antihistamines, Chlorpheniramine-Type Other (See Comments)    Reaction:  Unknown   . Aspirin Other (See Comments)    Reaction:  Unknown   . Ciprofloxacin Other (See Comments)    Reaction:  Unknown   . Codeine Other (See Comments)    Reaction:  Unknown   . Penicillins Other (See Comments)    Reaction:  Unknown  Has patient had a PCN reaction causing immediate rash, facial/tongue/throat swelling, SOB or lightheadedness with hypotension: Unsure Has patient had a PCN reaction causing severe rash involving mucus membranes or skin necrosis: Unsure Has patient had a PCN reaction that required hospitalization Unsure Has patient had a PCN reaction occurring within the last 10 years: Unsure If all of the above answers are "NO", then may proceed with Cephalosporin  use.   . Rocephin [Ceftriaxone Sodium In Dextrose] Other (See Comments)    Reaction:  Unknown   . Sulfa Antibiotics Other (See Comments)    Reaction:  Unknown   . Hydrochlorothiazide Rash    Family History  Problem Relation Age of Onset  . Heart attack Mother     Dec 1987  . CVA Mother   . Hypertension Mother   . Multiple myeloma Mother   . Breast cancer Sister   . Skin cancer Sister   . Prostate cancer Brother   . Throat cancer Brother   . Cervical cancer Daughter   . Leukemia  Daughter   . Leukemia Son   . Throat cancer Brother   . Cancer Brother     soft palette  . Colon cancer Neg Hx   . Pancreatic cancer Neg Hx     Prior to Admission medications   Medication Sig Start Date End Date Taking? Authorizing Provider  apixaban (ELIQUIS) 5 MG TABS tablet Take 5 mg by mouth 2 (two) times daily.   Yes Historical Provider, MD  atorvastatin (LIPITOR) 20 MG tablet Take 20 mg by mouth daily.   Yes Historical Provider, MD  Calcium 600-200 MG-UNIT tablet Take 1 tablet by mouth daily.   Yes Historical Provider, MD  cholecalciferol (VITAMIN D) 1000 units tablet Take 1,000 Units by mouth daily.   Yes Historical Provider, MD  ENSURE PLUS (ENSURE PLUS) LIQD Take 237 mLs by mouth 3 (three) times daily between meals.   Yes Historical Provider, MD  fenofibrate 160 MG tablet Take 160 mg by mouth daily.   Yes Historical Provider, MD  ferrous sulfate 325 (65 FE) MG tablet Take 325 mg by mouth 2 (two) times daily with a meal.   Yes Historical Provider, MD  fluticasone (FLONASE) 50 MCG/ACT nasal spray Place 1 spray into both nostrils daily.   Yes Historical Provider, MD  folic acid (FOLVITE) 563 MCG tablet Take 400 mcg by mouth daily.   Yes Historical Provider, MD  HYDROcodone-acetaminophen (NORCO/VICODIN) 5-325 MG tablet Take 1 tablet by mouth every 4 (four) hours as needed for moderate pain.   Yes Historical Provider, MD  hydrocortisone (ANUSOL-HC) 25 MG suppository Place 25 mg rectally at bedtime.    Yes Historical Provider, MD  ipratropium-albuterol (DUONEB) 0.5-2.5 (3) MG/3ML SOLN Take 3 mLs by nebulization 2 (two) times daily.   Yes Historical Provider, MD  metoprolol (LOPRESSOR) 100 MG tablet Take 100 mg by mouth 2 (two) times daily.   Yes Historical Provider, MD  montelukast (SINGULAIR) 10 MG tablet Take 10 mg by mouth daily.   Yes Historical Provider, MD  Multiple Vitamin (MULTIVITAMIN WITH MINERALS) TABS tablet Take 1 tablet by mouth daily.   Yes Historical Provider, MD    omeprazole (PRILOSEC) 40 MG capsule Take 40 mg by mouth daily.   Yes Historical Provider, MD  ondansetron (ZOFRAN) 8 MG tablet Take 8 mg by mouth every 6 (six) hours as needed for nausea or vomiting.   Yes Historical Provider, MD  PARoxetine (PAXIL) 20 MG tablet Take 20 mg by mouth at bedtime.    Yes Historical Provider, MD  pyridOXINE (VITAMIN B-6) 100 MG tablet Take 100 mg by mouth daily.   Yes Historical Provider, MD  vitamin B-12 (CYANOCOBALAMIN) 1000 MCG tablet Take 1,000 mcg by mouth daily.   Yes Historical Provider, MD    Physical Exam: Vitals:   08/26/16 0100 08/26/16 0115 08/26/16 0200 08/26/16 0237  BP: (!) 103/47   126/75  Pulse: 94 103  99  Resp: 21 22 19 18   Temp:    98.3 F (36.8 C)  TempSrc:    Oral  SpO2: (!) 89% 91%  97%  Weight:    110.5 kg (243 lb 9.7 oz)  Height:    5' 4"  (1.626 m)      Constitutional: Moderately built and nourished. Vitals:   08/26/16 0100 08/26/16 0115 08/26/16 0200 08/26/16 0237  BP: (!) 103/47   126/75  Pulse: 94 103  99  Resp: 21 22 19 18   Temp:    98.3 F (36.8 C)  TempSrc:    Oral  SpO2: (!) 89% 91%  97%  Weight:    110.5 kg (243 lb 9.7 oz)  Height:    5' 4"  (1.626 m)   Eyes: Anicteric no pallor. ENMT: No discharge from the ears eyes nose and mouth. Neck: No mass felt. No JVD appreciated. Respiratory: No rhonchi or crepitations. Cardiovascular: S1 and S2 heard. No murmurs appreciated. Abdomen: Soft nontender bowel sounds present. No guarding or rigidity. Colostomy seen. Musculoskeletal: No edema. No joint effusion. Skin: No rash. Skin appears warm. Neurologic: Alert awake oriented to time place and person. Moves all extremities. Psychiatric: Appears normal. Normal affect.   Labs on Admission: I have personally reviewed following labs and imaging studies  CBC:  Recent Labs Lab 08/25/16 2340  WBC 5.7  NEUTROABS 4.4  HGB 9.8*  HCT 34.4*  MCV 86.6  PLT 671*   Basic Metabolic Panel:  Recent Labs Lab  08/25/16 2340  NA 138  K 4.7  CL 94*  CO2 39*  GLUCOSE 148*  BUN 14  CREATININE 0.64  CALCIUM 9.0   GFR: Estimated Creatinine Clearance: 64.8 mL/min (by C-G formula based on SCr of 0.64 mg/dL). Liver Function Tests: No results for input(s): AST, ALT, ALKPHOS, BILITOT, PROT, ALBUMIN in the last 168 hours. No results for input(s): LIPASE, AMYLASE in the last 168 hours. No results for input(s): AMMONIA in the last 168 hours. Coagulation Profile: No results for input(s): INR, PROTIME in the last 168 hours. Cardiac Enzymes:  Recent Labs Lab 08/25/16 2340  TROPONINI 0.15*   BNP (last 3 results) No results for input(s): PROBNP in the last 8760 hours. HbA1C: No results for input(s): HGBA1C in the last 72 hours. CBG: No results for input(s): GLUCAP in the last 168 hours. Lipid Profile: No results for input(s): CHOL, HDL, LDLCALC, TRIG, CHOLHDL, LDLDIRECT in the last 72 hours. Thyroid Function Tests: No results for input(s): TSH, T4TOTAL, FREET4, T3FREE, THYROIDAB in the last 72 hours. Anemia Panel: No results for input(s): VITAMINB12, FOLATE, FERRITIN, TIBC, IRON, RETICCTPCT in the last 72 hours. Urine analysis: No results found for: COLORURINE, APPEARANCEUR, LABSPEC, PHURINE, GLUCOSEU, HGBUR, BILIRUBINUR, KETONESUR, PROTEINUR, UROBILINOGEN, NITRITE, LEUKOCYTESUR Sepsis Labs: @LABRCNTIP (procalcitonin:4,lacticidven:4) )No results found for this or any previous visit (from the past 240 hour(s)).   Radiological Exams on Admission: Dg Chest 2 View  Result Date: 08/26/2016 CLINICAL DATA:  Productive cough for 3-4 days with shortness of breath EXAM: CHEST  2 VIEW COMPARISON:  07/25/2016, 12 11,017 FINDINGS: Increased opacity in the left lower lobe, could reflect in infiltrate. Postsurgical changes of the right thorax with linear scarring in the right mid lung. Scattered pulmonary nodules are present in the right upper and lower lobes, questionable few new small nodules in the right  upper lobe. Cardiomediastinal silhouette stable with atherosclerosis. No pneumothorax. IMPRESSION: 1. Increased opacity at the left lung base could relate to an infiltrate 2. Multiple  small nodules in the right upper and lower lobes. Electronically Signed   By: Donavan Foil M.D.   On: 08/26/2016 00:38    EKG: Independently reviewed. A. fib rate controlled.  Assessment/Plan Principal Problem:   Acute respiratory failure with hypoxia (HCC) Active Problems:   Pulmonary embolus (HCC)   Anal cancer (HCC)   Elevated troponin   Atrial fibrillation ([place]), CHA2DS2-VASc Score 5   Essential hypertension   HCAP (healthcare-associated pneumonia)    1. Acute respiratory failure with hypoxia - most likely from pneumonia. Patient has been placed on empiric antibiotics for healthcare associated pneumonia. Patient is allergic to penicillin. Check influenza PCR, follow blood cultures sputum cultures. 2. Atrial fibrillation chads 2 vasc score of 5. Patient is on Apixaban and metoprolol. 3. History of pulmonary embolism on apixaban. 4. Chronic anemia and thrombocytopenia - follow CBC. 5. 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 6. Lung nodules with history of carcinoid - oncologist is planning to have PET scan done next week to see if patient has metastasis or known carcinoid.   DVT prophylaxis: Apixaban. Code Status: Full code.  Family Communication: Patient's daughter.  Disposition Plan: Skilled nursing facility.  Consults called: None.  Admission status: Inpatient.    Rise Patience MD [facility] Pager [phone].  If 7PM-7AM, please contact night-coverage [email or web address] Password [email or web address]  08/26/2016, 3:15 AM

## 2016-08-26 NOTE — Progress Notes (Signed)
PROGRESS NOTE    Tricia Hernandez  GHW:[id number] DOB: [birth year] DOA: 08/25/2016 PCP: Clinton Quant, MD    Brief Narrative:  Tricia Hernandez is a 83 y.o. female with recently diagnosed anal cancer on radiation therapy and has had recent diverting colostomy was brought to the ER after patient was having persistent shortness of breath with subjective feeling of fever and chills and nausea. Patient's symptoms have been ongoing for last 6 days. In the ER chest x-ray shows opacity concerning for pneumonia. In the ER patient has required at least 4 L oxygen to maintain sats. Abdomen on exam appears benign. Patient is being admitted for IV antibiotics and further management.   ED Course: Patient was started on IV antibiotics for healthcare associated pneumonia. Chest x-ray shows opacity concerning for pneumonia.  Patient admitted and continued on IV antibiotics.   Assessment & Plan:   Principal Problem:   Acute respiratory failure with hypoxia (HCC) Active Problems:   Pulmonary embolus (HCC)   Anal cancer (HCC)   Elevated troponin   Atrial fibrillation (HCC), CHA2DS2-VASc Score 5   Essential hypertension   HCAP (healthcare-associated pneumonia)  Acute respiratory failure with hypoxia  - most likely from pneumonia - elevated troponin likely from respiratory distress - troponin appears to have plataeued - will repeat troponin 1 more time - continue antibiotics or aztreonam and vancomycine - influenza PCR negative - follow blood cultures  Atrial fibrillation chads 2 vasc score of 5.  - on Apixaban and metoprolol  History of pulmonary embolism - currently on apixaban  Chronic anemia and thrombocytopenia  - follow CBC  Recently diagnosed anal squamous cell carcinoma  - on radiation therapy  Lung nodules with history of carcinoid  - oncologist is planning to have PET scan done next week to see if patient has metastasis or known carcinoid   DVT prophylaxis: Apixaban. Code  Status: Full code.  Family Communication: Patient's daughter.  Disposition Plan: Skilled nursing facility.   Consultants:   None  Procedures:   None  Antimicrobials:   Aztreonam  Vancomycin    Subjective: Patient seen during rounds.  She is very hard of hearing.  Denies any chest pain or chest pressure.  Reports her breathing is improved.   Objective: Vitals:   08/26/16 0237 08/26/16 0750 08/26/16 1210 08/26/16 1419  BP: 126/75   120/62  Pulse: 99   99  Resp: 18   20  Temp: 98.3 F (36.8 C)   98 F (36.7 C)  TempSrc: Oral   Axillary  SpO2: 97% 98% 99% 96%  Weight: 110.5 kg (243 lb 9.7 oz)     Height: '5\' 4"'$  (1.626 m)       Intake/Output Summary (Last 24 hours) at 08/26/16 1536 Last data filed at 08/26/16 0935  Gross per 24 hour  Intake           417.92 ml  Output                0 ml  Net           417.92 ml   Filed Weights   08/25/16 2312 08/26/16 0237  Weight: 112.4 kg (247 lb 12.8 oz) 110.5 kg (243 lb 9.7 oz)    Examination:  General exam: Appears calm and comfortable  Respiratory system: Crackles in left lung base and middle left lung, nasal cannula in place, slight increase work of breathing Cardiovascular system: S1 & S2 heard, irregularly irregular rate. No JVD, murmurs, rubs, gallops or clicks. No pedal  edema. Gastrointestinal system: Abdomen is nondistended, soft and nontender. No organomegaly or masses felt. Normal bowel sounds heard. Central nervous system: Alert and oriented to self, very hard of hearing. Extremities: Symmetric 5 x 5 power. Skin: No rashes, lesions or ulcers Psychiatry: Judgement and insight appear normal. Mood & affect appropriate.     Data Reviewed: I have personally reviewed following labs and imaging studies  CBC:  Recent Labs Lab 08/25/16 2340 08/26/16 0558  WBC 5.7 5.8  NEUTROABS 4.4 4.3  HGB 9.8* 10.0*  HCT 34.4* 34.2*  MCV 86.6 86.6  PLT 121* 481*   Basic Metabolic Panel:  Recent Labs Lab 08/25/16 2340  08/26/16 0558  NA 138 139  K 4.7 5.1  CL 94* 93*  CO2 39* 38*  GLUCOSE 148* 119*  BUN 14 13  CREATININE 0.64 0.67  CALCIUM 9.0 8.7*   GFR: Estimated Creatinine Clearance: 64.8 mL/min (by C-G formula based on SCr of 0.67 mg/dL). Liver Function Tests:  Recent Labs Lab 08/26/16 0558  AST 34  ALT 23  ALKPHOS 58  BILITOT 0.4  PROT 6.6  ALBUMIN 3.1*   No results for input(s): LIPASE, AMYLASE in the last 168 hours. No results for input(s): AMMONIA in the last 168 hours. Coagulation Profile: No results for input(s): INR, PROTIME in the last 168 hours. Cardiac Enzymes:  Recent Labs Lab 08/25/16 2340 08/26/16 0558 08/26/16 1106  TROPONINI 0.15* 0.16* 0.16*   BNP (last 3 results) No results for input(s): PROBNP in the last 8760 hours. HbA1C: No results for input(s): HGBA1C in the last 72 hours. CBG: No results for input(s): GLUCAP in the last 168 hours. Lipid Profile: No results for input(s): CHOL, HDL, LDLCALC, TRIG, CHOLHDL, LDLDIRECT in the last 72 hours. Thyroid Function Tests: No results for input(s): TSH, T4TOTAL, FREET4, T3FREE, THYROIDAB in the last 72 hours. Anemia Panel: No results for input(s): VITAMINB12, FOLATE, FERRITIN, TIBC, IRON, RETICCTPCT in the last 72 hours. Sepsis Labs:  Recent Labs Lab 08/26/16 0146  LATICACIDVEN 0.91    Recent Results (from the past 240 hour(s))  MRSA PCR Screening     Status: None   Collection Time: 08/26/16  3:11 AM  Result Value Ref Range Status   MRSA by PCR NEGATIVE NEGATIVE Final    Comment:        The GeneXpert MRSA Assay (FDA approved for NASAL specimens only), is one component of a comprehensive MRSA colonization surveillance program. It is not intended to diagnose MRSA infection nor to guide or monitor treatment for MRSA infections.          Radiology Studies: Dg Chest 2 View  Result Date: 08/26/2016 CLINICAL DATA:  Productive cough for 3-4 days with shortness of breath EXAM: CHEST  2 VIEW  COMPARISON:  07/25/2016, 12 11,017 FINDINGS: Increased opacity in the left lower lobe, could reflect in infiltrate. Postsurgical changes of the right thorax with linear scarring in the right mid lung. Scattered pulmonary nodules are present in the right upper and lower lobes, questionable few new small nodules in the right upper lobe. Cardiomediastinal silhouette stable with atherosclerosis. No pneumothorax. IMPRESSION: 1. Increased opacity at the left lung base could relate to an infiltrate 2. Multiple small nodules in the right upper and lower lobes. Electronically Signed   By: Donavan Foil M.D.   On: 08/26/2016 00:38        Scheduled Meds: . apixaban  5 mg Oral BID  . atorvastatin  20 mg Oral Daily  . aztreonam  2 g  Intravenous Q8H  . budesonide (PULMICORT) nebulizer solution  0.25 mg Nebulization BID  . calcium-vitamin D  1 tablet Oral Daily  . cholecalciferol  1,000 Units Oral Daily  . feeding supplement (ENSURE ENLIVE)  237 mL Oral TID BM  . fenofibrate  160 mg Oral Daily  . ferrous sulfate  325 mg Oral BID WC  . fluticasone  1 spray Each Nare Daily  . folic acid  390 mcg Oral Daily  . hydrocortisone  25 mg Rectal QHS  . [START ON 08/27/2016] Influenza vac split quadrivalent PF  0.5 mL Intramuscular Tomorrow-1000  . ipratropium-albuterol  3 mL Nebulization TID  . metoprolol  100 mg Oral BID  . montelukast  10 mg Oral Daily  . multivitamin with minerals  1 tablet Oral Daily  . pantoprazole  40 mg Oral Daily  . PARoxetine  20 mg Oral QHS  . pyridOXINE  100 mg Oral Daily  . [START ON 08/27/2016] vancomycin  1,250 mg Intravenous Q24H  . vitamin B-12  1,000 mcg Oral Daily   Continuous Infusions: . sodium chloride 125 mL/hr at 08/26/16 0932     LOS: 0 days    Time spent: 30 minutes    Loretha Stapler, MD [facility] Pager [phone]  If 7PM-7AM, please contact night-coverage [email or web address] Password [facility] 08/26/2016, 3:36 PM

## 2016-08-26 NOTE — Progress Notes (Signed)
Pharmacy Antibiotic Note  Tricia Hernandez is a 83 y.o. female  C/o cough, congestion and wheezing admitted on 08/25/2016 with pneumonia.  Pharmacy has been consulted for azactam and vancomcyin dosing.  Plan: Azactam 2 Gm IV q8h Vancomycin 2 Gm x1 then 1250 mg IV q24h VT=15-20 mg/L F/u Scr/cultures/levels as needed  Height: '5\' 4"'$  (162.6 cm) Weight: 247 lb 12.8 oz (112.4 kg) IBW/kg (Calculated) : 54.7  Temp (24hrs), Avg:98.4 F (36.9 C), Min:98.4 F (36.9 C), Max:98.4 F (36.9 C)   Recent Labs Lab 08/25/16 2340  WBC 5.7    CrCl cannot be calculated (Patient's most recent lab result is older than the maximum 21 days allowed.).    Allergies  Allergen Reactions  . Aleve [Naproxen Sodium] Hives  . Antihistamines, Chlorpheniramine-Type Other (See Comments)    Reaction:  Unknown   . Aspirin Other (See Comments)    Reaction:  Unknown   . Ciprofloxacin Other (See Comments)    Reaction:  Unknown   . Codeine Other (See Comments)    Reaction:  Unknown   . Penicillins Other (See Comments)    Reaction:  Unknown  Has patient had a PCN reaction causing immediate rash, facial/tongue/throat swelling, SOB or lightheadedness with hypotension: Unsure Has patient had a PCN reaction causing severe rash involving mucus membranes or skin necrosis: Unsure Has patient had a PCN reaction that required hospitalization Unsure Has patient had a PCN reaction occurring within the last 10 years: Unsure If all of the above answers are "NO", then may proceed with Cephalosporin use.   . Rocephin [Ceftriaxone Sodium In Dextrose] Other (See Comments)    Reaction:  Unknown   . Sulfa Antibiotics Other (See Comments)    Reaction:  Unknown   . Hydrochlorothiazide Rash    Antimicrobials this admission: 1/15 azactam >>  1/15 vancomycin >>   Dose adjustments this admission:   Microbiology results:  BCx:   UCx:    Sputum:    MRSA PCR:   Thank you for allowing pharmacy to be a part of this patient's  care.  Dorrene German 08/26/2016 1:25 AM

## 2016-08-26 NOTE — ED Notes (Signed)
Patient denies pain and is resting comfortably.  

## 2016-08-26 NOTE — ED Notes (Signed)
Patient transported to X-ray 

## 2016-08-26 NOTE — ED Provider Notes (Signed)
[name] DEPT Provider Note   CSN: [id number] Arrival date & time: 08/25/16  2258   By signing my name below, I, Eunice Blase, attest that this documentation has been prepared under the direction and in the presence of Elnora Morrison, MD. Electronically signed, Eunice Blase, ED Scribe. 08/26/16. 3:59 AM.   History   Chief Complaint Chief Complaint  Patient presents with  . Shortness of Breath  . Cough   The history is provided by medical records, the patient and a relative. No language interpreter was used.    HPI Comments: Tricia Hernandez is a 83 y.o. female BIB EMS from [name] who presents to the Emergency Department complaining of congestion x ~6-7 days. Relative notes symptoms have been worsening, and she reports associatd cough, SOB, fever and chills. No sick contacts noted. Pt given 5 mg Albuterol nebulizer and 4L/[place] en route and maintained 95%-98% SPO2, per triage. Triage further notes EMS and [name] staff report pt has known URI and she is full code. Hx of A-fib, lung resection for cancer and clots on the left side in the lungs noted. She reveals a colostomy bag in place, and triage notes recent colostomy surgery. Relative further states pt is on Eliquis at home. States pt has anal cancer, which has spread to the lymph nodes. Last cancer radiation therapy reportedly ~3-4 days ago. Pt also reportedly cheduled for imaging 09/06/2016 to rule out lung cancer. Relative notes pt has been on 2 L of O2 since 07/2016. Currently on ~3-4L O2 in examination room during evaluation. Pt is in a rehab center in [place] for help with multiple medical problems. Penicillin allergy noted. Triage notes pt very hard of hearing.  Past Medical History:  Diagnosis Date  . Anxiety   . Atrial fibrillation ([place])   . Chronic bronchitis ([place])   . Chronic depression   . Diverticular disease   . Fibrocystic disease of breast   . Fracture of shoulder   . GERD (gastroesophageal reflux  disease)   . Herpes zoster   . Hyperlipidemia   . Hypertension   . Labyrinthitis   . Lung cancer ([place])   . Obesity   . Paroxysmal supraventricular tachycardia ([place])   . Pulmonary embolus ([place])   . Vitamin D deficiency     Patient Active Problem List   Diagnosis Date Noted  . HCAP (healthcare-associated pneumonia) 08/26/2016  . Acute respiratory failure with hypoxia ([place]) 08/26/2016  . Essential hypertension   . Lung nodule   . Palliative care encounter   . Dyspnea 07/23/2016  . SOB (shortness of breath) 07/22/2016  . Symptomatic anemia 07/22/2016  . Anal cancer ([place]) 07/22/2016  . BRBPR (bright red blood per rectum) 07/22/2016  . Elevated troponin 07/22/2016  . Atrial fibrillation ([place]), CHA2DS2-VASc Score 5 07/22/2016  . Pulmonary embolus ([place])   . Obesity   . Lung cancer ([place])   . Hypertension     Past Surgical History:  Procedure Laterality Date  . APPENDECTOMY    . BASAL CELL CARCINOMA EXCISION     nose  . breast cyst removal     right  . CATARACT EXTRACTION Bilateral   . CHOLECYSTECTOMY    . COLOSTOMY N/A 07/29/2016   Procedure: COLOSTOMY;  Surgeon: Clovis Riley, MD;  Location: [facility];  Service: General;  Laterality: N/A;  . EVALUATION UNDER ANESTHESIA WITH HEMORRHOIDECTOMY AND PROCTOSCOPY N/A 07/25/2016   Procedure: EXAM UNDER ANESTHESIA, EXCISION PERIANAL MASS.;  Surgeon: Judeth Horn, MD;  Location: [facility];  Service: General;  Laterality: N/A;  Prone position  . LAPAROSCOPIC DIVERTED COLOSTOMY N/A 07/29/2016   Procedure: ATTEMPTED LAPAROSCOPIC ASSISTED COLOSTOMY;  Surgeon: Clovis Riley, MD;  Location: [facility];  Service: General;  Laterality: N/A;  . LUNG REMOVAL, PARTIAL  2013   RML mass/carcinoid, Dr Lurena Nida  . TONSILLECTOMY AND ADENOIDECTOMY    . TOTAL VAGINAL HYSTERECTOMY      OB History    No data available       Home Medications    Prior to Admission medications   Medication Sig Start Date End Date Taking? Authorizing Provider  apixaban  (ELIQUIS) 5 MG TABS tablet Take 5 mg by mouth 2 (two) times daily.   Yes Historical Provider, MD  atorvastatin (LIPITOR) 20 MG tablet Take 20 mg by mouth daily.   Yes Historical Provider, MD  Calcium 600-200 MG-UNIT tablet Take 1 tablet by mouth daily.   Yes Historical Provider, MD  cholecalciferol (VITAMIN D) 1000 units tablet Take 1,000 Units by mouth daily.   Yes Historical Provider, MD  ENSURE PLUS (ENSURE PLUS) LIQD Take 237 mLs by mouth 3 (three) times daily between meals.   Yes Historical Provider, MD  fenofibrate 160 MG tablet Take 160 mg by mouth daily.   Yes Historical Provider, MD  ferrous sulfate 325 (65 FE) MG tablet Take 325 mg by mouth 2 (two) times daily with a meal.   Yes Historical Provider, MD  fluticasone (FLONASE) 50 MCG/ACT nasal spray Place 1 spray into both nostrils daily.   Yes Historical Provider, MD  folic acid (FOLVITE) 284 MCG tablet Take 400 mcg by mouth daily.   Yes Historical Provider, MD  HYDROcodone-acetaminophen (NORCO/VICODIN) 5-325 MG tablet Take 1 tablet by mouth every 4 (four) hours as needed for moderate pain.   Yes Historical Provider, MD  hydrocortisone (ANUSOL-HC) 25 MG suppository Place 25 mg rectally at bedtime.    Yes Historical Provider, MD  ipratropium-albuterol (DUONEB) 0.5-2.5 (3) MG/3ML SOLN Take 3 mLs by nebulization 2 (two) times daily.   Yes Historical Provider, MD  metoprolol (LOPRESSOR) 100 MG tablet Take 100 mg by mouth 2 (two) times daily.   Yes Historical Provider, MD  montelukast (SINGULAIR) 10 MG tablet Take 10 mg by mouth daily.   Yes Historical Provider, MD  Multiple Vitamin (MULTIVITAMIN WITH MINERALS) TABS tablet Take 1 tablet by mouth daily.   Yes Historical Provider, MD  omeprazole (PRILOSEC) 40 MG capsule Take 40 mg by mouth daily.   Yes Historical Provider, MD  ondansetron (ZOFRAN) 8 MG tablet Take 8 mg by mouth every 6 (six) hours as needed for nausea or vomiting.   Yes Historical Provider, MD  PARoxetine (PAXIL) 20 MG tablet Take  20 mg by mouth at bedtime.    Yes Historical Provider, MD  pyridOXINE (VITAMIN B-6) 100 MG tablet Take 100 mg by mouth daily.   Yes Historical Provider, MD  vitamin B-12 (CYANOCOBALAMIN) 1000 MCG tablet Take 1,000 mcg by mouth daily.   Yes Historical Provider, MD    Family History Family History  Problem Relation Age of Onset  . Heart attack Mother     Dec 1987  . CVA Mother   . Hypertension Mother   . Multiple myeloma Mother   . Breast cancer Sister   . Skin cancer Sister   . Prostate cancer Brother   . Throat cancer Brother   . Cervical cancer Daughter   . Leukemia Daughter   . Leukemia Son   . Throat cancer Brother   .  Cancer Brother     soft palette  . Colon cancer Neg Hx   . Pancreatic cancer Neg Hx     Social History Social History  Substance Use Topics  . Smoking status: Former Smoker    Quit date: 1976  . Smokeless tobacco: Never Used  . Alcohol use No     Allergies   Aleve [naproxen sodium]; Antihistamines, chlorpheniramine-type; Aspirin; Ciprofloxacin; Codeine; Penicillins; Rocephin [ceftriaxone sodium in dextrose]; Sulfa antibiotics; and Hydrochlorothiazide   Review of Systems Review of Systems  Constitutional: Positive for chills, fatigue and fever.  HENT: Positive for congestion.   Respiratory: Positive for cough and shortness of breath.   All other systems reviewed and are negative.    Physical Exam Updated Vital Signs BP 126/75 (BP Location: Left Arm)   Pulse 99   Temp 98.3 F (36.8 C) (Oral)   Resp 18   Ht 5' 4"  (1.626 m)   Wt 243 lb 9.7 oz (110.5 kg)   SpO2 97%   BMI 41.82 kg/m   Physical Exam  Constitutional: She appears well-developed and well-nourished. No distress.  HENT:  Head: Normocephalic.  Mouth/Throat: Mucous membranes are dry.  Eyes: Conjunctivae are normal. No scleral icterus.  Neck: Normal range of motion.  Cardiovascular: Normal rate and intact distal pulses.   Mild swelling to bilateral lower extremities.    Pulmonary/Chest: Effort normal. She has wheezes (End expiratory). She has rales.  Abdominal: Soft. There is no tenderness.  Musculoskeletal: Normal range of motion.  Neurological: She is alert.  Skin: Skin is warm and dry.  Nursing note and vitals reviewed.  ED Treatments / Results  DIAGNOSTIC STUDIES: Oxygen Saturation is 89% on supplemental O2, low by my interpretation.    COORDINATION OF CARE: 3:59 AM Discussed treatment plan with pt at bedside and pt agreed to plan.  Labs (all labs ordered are listed, but only abnormal results are displayed) Labs Reviewed  URINE CULTURE - Abnormal; Notable for the following:       Result Value   Culture 20,000 COLONIES/mL YEAST (*)    All other components within normal limits  CBC WITH DIFFERENTIAL/PLATELET - Abnormal; Notable for the following:    Hemoglobin 9.8 (*)    HCT 34.4 (*)    MCH 24.7 (*)    MCHC 28.5 (*)    Platelets 121 (*)    Lymphs Abs 0.5 (*)    All other components within normal limits  BASIC METABOLIC PANEL - Abnormal; Notable for the following:    Chloride 94 (*)    CO2 39 (*)    Glucose, Bld 148 (*)    All other components within normal limits  BRAIN NATRIURETIC PEPTIDE - Abnormal; Notable for the following:    B Natriuretic Peptide 298.0 (*)    All other components within normal limits  TROPONIN I - Abnormal; Notable for the following:    Troponin I 0.15 (*)    All other components within normal limits  TROPONIN I - Abnormal; Notable for the following:    Troponin I 0.16 (*)    All other components within normal limits  TROPONIN I - Abnormal; Notable for the following:    Troponin I 0.16 (*)    All other components within normal limits  TROPONIN I - Abnormal; Notable for the following:    Troponin I 0.14 (*)    All other components within normal limits  COMPREHENSIVE METABOLIC PANEL - Abnormal; Notable for the following:    Chloride 93 (*)  CO2 38 (*)    Glucose, Bld 119 (*)    Calcium 8.7 (*)     Albumin 3.1 (*)    All other components within normal limits  CBC WITH DIFFERENTIAL/PLATELET - Abnormal; Notable for the following:    Hemoglobin 10.0 (*)    HCT 34.2 (*)    MCH 25.3 (*)    MCHC 29.2 (*)    Platelets 109 (*)    Lymphs Abs 0.5 (*)    All other components within normal limits  CBC WITH DIFFERENTIAL/PLATELET - Abnormal; Notable for the following:    RBC 3.57 (*)    Hemoglobin 8.8 (*)    HCT 31.2 (*)    MCH 24.6 (*)    MCHC 28.2 (*)    Platelets 116 (*)    Lymphs Abs 0.4 (*)    All other components within normal limits  TROPONIN I - Abnormal; Notable for the following:    Troponin I 0.15 (*)    All other components within normal limits  BASIC METABOLIC PANEL - Abnormal; Notable for the following:    Chloride 95 (*)    CO2 38 (*)    Glucose, Bld 123 (*)    Calcium 8.7 (*)    All other components within normal limits  BLOOD GAS, ARTERIAL - Abnormal; Notable for the following:    pCO2 arterial 56.1 (*)    pO2, Arterial 76.3 (*)    Bicarbonate 37.9 (*)    Acid-Base Excess 12.4 (*)    All other components within normal limits  AMMONIA - Abnormal; Notable for the following:    Ammonia 44 (*)    All other components within normal limits  URINALYSIS, ROUTINE W REFLEX MICROSCOPIC - Abnormal; Notable for the following:    APPearance HAZY (*)    Specific Gravity, Urine >1.046 (*)    Hgb urine dipstick LARGE (*)    Protein, ur 30 (*)    Leukocytes, UA LARGE (*)    Bacteria, UA RARE (*)    Squamous Epithelial / LPF 0-5 (*)    All other components within normal limits  CBC WITH DIFFERENTIAL/PLATELET - Abnormal; Notable for the following:    RBC 3.72 (*)    Hemoglobin 9.2 (*)    HCT 31.9 (*)    MCH 24.7 (*)    MCHC 28.8 (*)    Platelets 119 (*)    Lymphs Abs 0.6 (*)    All other components within normal limits  COMPREHENSIVE METABOLIC PANEL - Abnormal; Notable for the following:    Chloride 94 (*)    CO2 38 (*)    Glucose, Bld 132 (*)    Total Protein 6.4 (*)     Albumin 2.9 (*)    All other components within normal limits  PHOSPHORUS - Abnormal; Notable for the following:    Phosphorus 2.4 (*)    All other components within normal limits  CBC WITH DIFFERENTIAL/PLATELET - Abnormal; Notable for the following:    RBC 3.68 (*)    Hemoglobin 9.0 (*)    HCT 30.9 (*)    MCH 24.5 (*)    MCHC 29.1 (*)    Platelets 135 (*)    Lymphs Abs 0.4 (*)    All other components within normal limits  COMPREHENSIVE METABOLIC PANEL - Abnormal; Notable for the following:    Chloride 93 (*)    CO2 39 (*)    Glucose, Bld 142 (*)    Total Protein 6.4 (*)    Albumin  2.8 (*)    All other components within normal limits  CBC WITH DIFFERENTIAL/PLATELET - Abnormal; Notable for the following:    Hemoglobin 10.0 (*)    HCT 33.6 (*)    MCH 24.8 (*)    MCHC 29.8 (*)    RDW 15.8 (*)    Neutro Abs 8.2 (*)    Lymphs Abs 0.4 (*)    All other components within normal limits  COMPREHENSIVE METABOLIC PANEL - Abnormal; Notable for the following:    Chloride 91 (*)    CO2 41 (*)    Glucose, Bld 134 (*)    Albumin 2.9 (*)    All other components within normal limits  CBC WITH DIFFERENTIAL/PLATELET - Abnormal; Notable for the following:    Hemoglobin 10.2 (*)    HCT 33.9 (*)    MCH 24.6 (*)    RDW 15.9 (*)    Neutro Abs 9.3 (*)    Lymphs Abs 0.4 (*)    All other components within normal limits  COMPREHENSIVE METABOLIC PANEL - Abnormal; Notable for the following:    Chloride 89 (*)    CO2 37 (*)    Glucose, Bld 126 (*)    BUN 25 (*)    Total Protein 5.9 (*)    Albumin 2.8 (*)    Total Bilirubin 0.2 (*)    All other components within normal limits  PHOSPHORUS - Abnormal; Notable for the following:    Phosphorus 2.3 (*)    All other components within normal limits  CULTURE, BLOOD (ROUTINE X 2)  CULTURE, BLOOD (ROUTINE X 2)  MRSA PCR SCREENING  RESPIRATORY PANEL BY PCR  CULTURE, EXPECTORATED SPUTUM-ASSESSMENT  CULTURE, RESPIRATORY (NON-EXPECTORATED)  INFLUENZA  PANEL BY PCR (TYPE A & B, H1N1)  MAGNESIUM  MAGNESIUM  PHOSPHORUS  MAGNESIUM  PHOSPHORUS  MAGNESIUM  CBC WITH DIFFERENTIAL/PLATELET  COMPREHENSIVE METABOLIC PANEL  MAGNESIUM  PHOSPHORUS  I-STAT CG4 LACTIC ACID, ED    EKG  EKG Interpretation  Date/Time:  Monday August 26 2016 00:44:51 EST Ventricular Rate:  93 PR Interval:    QRS Duration: 70 QT Interval:  319 QTC Calculation: 397 R Axis:   41 Text Interpretation:  Atrial fibrillation Confirmed by COOK  MD, BRIAN ([id number]) on 08/27/2016 4:34:28 PM       Radiology No results found.  Procedures Procedures (including critical care time)  Medications Ordered in ED Medications  aztreonam (AZACTAM) 2 GM IVPB (2 g Intravenous New Bag/Given 09/01/16 1715)  Influenza vac split quadrivalent PF (FLUARIX) injection 0.5 mL (0.5 mLs Intramuscular Not Given 08/27/16 1116)  calcium-vitamin D (OSCAL WITH D) 500-200 MG-UNIT per tablet 1 tablet (1 tablet Oral Given 09/01/16 1132)  cholecalciferol (VITAMIN D) tablet 1,000 Units (1,000 Units Oral Given 09/01/16 1134)  feeding supplement (ENSURE ENLIVE) (ENSURE ENLIVE) liquid 237 mL (237 mLs Oral Given 09/01/16 1715)  ferrous sulfate tablet 325 mg (325 mg Oral Given 09/01/16 1715)  HYDROcodone-acetaminophen (NORCO/VICODIN) 5-325 MG per tablet 1 tablet (1 tablet Oral Given 08/28/16 1028)  pantoprazole (PROTONIX) EC tablet 40 mg (40 mg Oral Given 09/01/16 1135)  ondansetron (ZOFRAN) tablet 8 mg (not administered)  multivitamin with minerals tablet 1 tablet (1 tablet Oral Given 09/01/16 1132)  apixaban (ELIQUIS) tablet 5 mg (5 mg Oral Given 09/01/16 2345)  atorvastatin (LIPITOR) tablet 20 mg (20 mg Oral Given 09/01/16 1715)  fenofibrate tablet 160 mg (160 mg Oral Given 09/01/16 1133)  fluticasone (FLONASE) 50 MCG/ACT nasal spray 1 spray (1 spray Each Nare Given 09/01/16 1136)  folic acid (FOLVITE) tablet 0.5 mg (0.5 mg Oral Given 09/01/16 1133)  hydrocortisone (ANUSOL-HC) suppository 25 mg (25 mg Rectal  Not Given 09/01/16 2346)  metoprolol (LOPRESSOR) tablet 100 mg (100 mg Oral Given 09/01/16 2345)  montelukast (SINGULAIR) tablet 10 mg (10 mg Oral Given 09/01/16 1134)  PARoxetine (PAXIL) tablet 20 mg (20 mg Oral Given 09/01/16 2348)  pyridOXINE (VITAMIN B-6) tablet 100 mg (100 mg Oral Given 09/01/16 1132)  vitamin B-12 (CYANOCOBALAMIN) tablet 1,000 mcg (1,000 mcg Oral Given 09/01/16 1132)  acetaminophen (TYLENOL) tablet 650 mg (650 mg Oral Given 08/31/16 1518)    Or  acetaminophen (TYLENOL) suppository 650 mg ( Rectal See Alternative 08/31/16 1518)  ondansetron (ZOFRAN) tablet 4 mg ( Oral See Alternative 08/26/16 1039)    Or  ondansetron (ZOFRAN) injection 4 mg (4 mg Intravenous Given 08/26/16 1039)  0.9 %  sodium chloride infusion ( Intravenous Stopped 08/27/16 0254)  ipratropium-albuterol (DUONEB) 0.5-2.5 (3) MG/3ML nebulizer solution 3 mL (3 mLs Nebulization Given 08/28/16 0335)  budesonide (PULMICORT) nebulizer solution 0.25 mg (0.25 mg Nebulization Given 09/01/16 1935)  ipratropium-albuterol (DUONEB) 0.5-2.5 (3) MG/3ML nebulizer solution 3 mL (3 mLs Nebulization Given 09/01/16 1934)  lip balm (CARMEX) ointment (not administered)  iopamidol (ISOVUE-370) 76 % injection (not administered)  doxycycline (VIBRA-TABS) tablet 100 mg (100 mg Oral Given 09/01/16 2345)  methylPREDNISolone sodium succinate (SOLU-MEDROL) 125 mg/2 mL injection 60 mg (60 mg Intravenous Given 09/01/16 2345)  vancomycin (VANCOCIN) 2,000 mg in sodium chloride 0.9 % 500 mL IVPB (2,000 mg Intravenous Given 08/26/16 0305)  iopamidol (ISOVUE-370) 76 % injection 100 mL (100 mLs Intravenous Contrast Given 08/27/16 1745)  LORazepam (ATIVAN) tablet 0.5 mg (0.5 mg Oral Given 08/28/16 0241)  sodium chloride HYPERTONIC 3 % nebulizer solution 4 mL (4 mLs Nebulization Given 08/30/16 1633)  methylPREDNISolone sodium succinate (SOLU-MEDROL) 125 mg/2 mL injection 125 mg (125 mg Intravenous Given 08/28/16 1300)     Initial Impression / Assessment and  Plan / ED Course  I have reviewed the triage vital signs and the nursing notes.  Pertinent labs & imaging results that were available during my care of the patient were reviewed by me and considered in my medical decision making (see chart for details).  Will order abx and labs and admit pt.    Patient with recent discharge and placement in rehabilitation facility presents with worsening short of breath and cough. Patient's been worked up for cancer and further treatment. Patient's had productive cough fever in terms of breath requiring more oxygen than her baseline of 2 L. On exam patient can't 4 L nasal cannula. Chest x-ray consistent with pneumonia and clinically. Antibiotics and cultures. Admitted to hospitalist.  The patients results and plan were reviewed and discussed.   Any x-rays performed were independently reviewed by myself.   Differential diagnosis were considered with the presenting HPI.  Medications  aztreonam (AZACTAM) 2 GM IVPB (2 g Intravenous New Bag/Given 09/01/16 1715)  Influenza vac split quadrivalent PF (FLUARIX) injection 0.5 mL (0.5 mLs Intramuscular Not Given 08/27/16 1116)  calcium-vitamin D (OSCAL WITH D) 500-200 MG-UNIT per tablet 1 tablet (1 tablet Oral Given 09/01/16 1132)  cholecalciferol (VITAMIN D) tablet 1,000 Units (1,000 Units Oral Given 09/01/16 1134)  feeding supplement (ENSURE ENLIVE) (ENSURE ENLIVE) liquid 237 mL (237 mLs Oral Given 09/01/16 1715)  ferrous sulfate tablet 325 mg (325 mg Oral Given 09/01/16 1715)  HYDROcodone-acetaminophen (NORCO/VICODIN) 5-325 MG per tablet 1 tablet (1 tablet Oral Given 08/28/16 1028)  pantoprazole (PROTONIX) EC tablet 40 mg (  40 mg Oral Given 09/01/16 1135)  ondansetron (ZOFRAN) tablet 8 mg (not administered)  multivitamin with minerals tablet 1 tablet (1 tablet Oral Given 09/01/16 1132)  apixaban (ELIQUIS) tablet 5 mg (5 mg Oral Given 09/01/16 2345)  atorvastatin (LIPITOR) tablet 20 mg (20 mg Oral Given 09/01/16 1715)    fenofibrate tablet 160 mg (160 mg Oral Given 09/01/16 1133)  fluticasone (FLONASE) 50 MCG/ACT nasal spray 1 spray (1 spray Each Nare Given 2/94/76 5465)  folic acid (FOLVITE) tablet 0.5 mg (0.5 mg Oral Given 09/01/16 1133)  hydrocortisone (ANUSOL-HC) suppository 25 mg (25 mg Rectal Not Given 09/01/16 2346)  metoprolol (LOPRESSOR) tablet 100 mg (100 mg Oral Given 09/01/16 2345)  montelukast (SINGULAIR) tablet 10 mg (10 mg Oral Given 09/01/16 1134)  PARoxetine (PAXIL) tablet 20 mg (20 mg Oral Given 09/01/16 2348)  pyridOXINE (VITAMIN B-6) tablet 100 mg (100 mg Oral Given 09/01/16 1132)  vitamin B-12 (CYANOCOBALAMIN) tablet 1,000 mcg (1,000 mcg Oral Given 09/01/16 1132)  acetaminophen (TYLENOL) tablet 650 mg (650 mg Oral Given 08/31/16 1518)    Or  acetaminophen (TYLENOL) suppository 650 mg ( Rectal See Alternative 08/31/16 1518)  ondansetron (ZOFRAN) tablet 4 mg ( Oral See Alternative 08/26/16 1039)    Or  ondansetron (ZOFRAN) injection 4 mg (4 mg Intravenous Given 08/26/16 1039)  0.9 %  sodium chloride infusion ( Intravenous Stopped 08/27/16 0254)  ipratropium-albuterol (DUONEB) 0.5-2.5 (3) MG/3ML nebulizer solution 3 mL (3 mLs Nebulization Given 08/28/16 0335)  budesonide (PULMICORT) nebulizer solution 0.25 mg (0.25 mg Nebulization Given 09/01/16 1935)  ipratropium-albuterol (DUONEB) 0.5-2.5 (3) MG/3ML nebulizer solution 3 mL (3 mLs Nebulization Given 09/01/16 1934)  lip balm (CARMEX) ointment (not administered)  iopamidol (ISOVUE-370) 76 % injection (not administered)  doxycycline (VIBRA-TABS) tablet 100 mg (100 mg Oral Given 09/01/16 2345)  methylPREDNISolone sodium succinate (SOLU-MEDROL) 125 mg/2 mL injection 60 mg (60 mg Intravenous Given 09/01/16 2345)  vancomycin (VANCOCIN) 2,000 mg in sodium chloride 0.9 % 500 mL IVPB (2,000 mg Intravenous Given 08/26/16 0305)  iopamidol (ISOVUE-370) 76 % injection 100 mL (100 mLs Intravenous Contrast Given 08/27/16 1745)  LORazepam (ATIVAN) tablet 0.5 mg (0.5 mg Oral  Given 08/28/16 0241)  sodium chloride HYPERTONIC 3 % nebulizer solution 4 mL (4 mLs Nebulization Given 08/30/16 1633)  methylPREDNISolone sodium succinate (SOLU-MEDROL) 125 mg/2 mL injection 125 mg (125 mg Intravenous Given 08/28/16 1300)    Vitals:   09/01/16 0850 09/01/16 1402 09/01/16 1532 09/01/16 2143  BP:  129/72  124/71  Pulse:  (!) 103  (!) 110  Resp:  18  20  Temp:  97.5 F (36.4 C)  97.9 F (36.6 C)  TempSrc:  Oral  Oral  SpO2: 95% 97% 94% 94%  Weight:      Height:        Final diagnoses:  Dyspnea  Confusion    Admission/ observation were discussed with the admitting physician, patient and/or family and they are comfortable with the plan.    Final Clinical Impressions(s) / ED Diagnoses   Final diagnoses:  Dyspnea  Confusion    New Prescriptions Current Discharge Medication List       Elnora Morrison, MD 09/02/16 [phone]

## 2016-08-27 ENCOUNTER — Inpatient Hospital Stay (HOSPITAL_COMMUNITY): Payer: Medicare Other

## 2016-08-27 ENCOUNTER — Ambulatory Visit
Admission: RE | Admit: 2016-08-27 | Discharge: 2016-08-27 | Disposition: A | Discharge status: Home / Self Care | Payer: Medicare Other | Source: Ambulatory Visit | Attending: Radiation Oncology | Admitting: Radiation Oncology

## 2016-08-27 ENCOUNTER — Ambulatory Visit: Payer: Medicare Other | Admitting: Hematology and Oncology

## 2016-08-27 LAB — RESPIRATORY PANEL BY PCR
Adenovirus: NOT DETECTED
BORDETELLA PERTUSSIS-RVPCR: NOT DETECTED
CORONAVIRUS HKU1-RVPPCR: NOT DETECTED
CORONAVIRUS NL63-RVPPCR: NOT DETECTED
Chlamydophila pneumoniae: NOT DETECTED
Coronavirus 229E: NOT DETECTED
Coronavirus OC43: NOT DETECTED
Influenza A: NOT DETECTED
Influenza B: NOT DETECTED
METAPNEUMOVIRUS-RVPPCR: NOT DETECTED
Mycoplasma pneumoniae: NOT DETECTED
PARAINFLUENZA VIRUS 2-RVPPCR: NOT DETECTED
PARAINFLUENZA VIRUS 3-RVPPCR: NOT DETECTED
Parainfluenza Virus 1: NOT DETECTED
Parainfluenza Virus 4: NOT DETECTED
RHINOVIRUS / ENTEROVIRUS - RVPPCR: NOT DETECTED
Respiratory Syncytial Virus: NOT DETECTED

## 2016-08-27 LAB — BLOOD GAS, ARTERIAL
Acid-Base Excess: 12.4 mmol/L — ABNORMAL HIGH (ref 0.0–2.0)
BICARBONATE: 37.9 mmol/L — AB (ref 20.0–28.0)
Drawn by: 295031
O2 Content: 3 L/min
O2 SAT: 96.2 %
PCO2 ART: 56.1 mmHg — AB (ref 32.0–48.0)
PO2 ART: 76.3 mmHg — AB (ref 83.0–108.0)
Patient temperature: 98.6
pH, Arterial: 7.444 (ref 7.350–7.450)

## 2016-08-27 LAB — BASIC METABOLIC PANEL
Anion gap: 5 (ref 5–15)
BUN: 14 mg/dL (ref 6–20)
CALCIUM: 8.7 mg/dL — AB (ref 8.9–10.3)
CO2: 38 mmol/L — ABNORMAL HIGH (ref 22–32)
Chloride: 95 mmol/L — ABNORMAL LOW (ref 101–111)
Creatinine, Ser: 0.61 mg/dL (ref 0.44–1.00)
GFR calc Af Amer: >60 mL/min (ref >60)
GLUCOSE: 123 mg/dL — AB (ref 65–99)
Potassium: 4.8 mmol/L (ref 3.5–5.1)
Sodium: 138 mmol/L (ref 135–145)

## 2016-08-27 LAB — CBC WITH DIFFERENTIAL/PLATELET
Basophils Absolute: 0 10*3/uL (ref 0.0–0.1)
Basophils Relative: 0 %
EOS ABS: 0.1 10*3/uL (ref 0.0–0.7)
EOS PCT: 3 %
HCT: 31.2 % — ABNORMAL LOW (ref 36.0–46.0)
Hemoglobin: 8.8 g/dL — ABNORMAL LOW (ref 12.0–15.0)
LYMPHS ABS: 0.4 10*3/uL — AB (ref 0.7–4.0)
Lymphocytes Relative: 7 %
MCH: 24.6 pg — AB (ref 26.0–34.0)
MCHC: 28.2 g/dL — AB (ref 30.0–36.0)
MCV: 87.4 fL (ref 78.0–100.0)
MONO ABS: 0.6 10*3/uL (ref 0.1–1.0)
MONOS PCT: 11 %
Neutro Abs: 4.2 10*3/uL (ref 1.7–7.7)
Neutrophils Relative %: 79 %
PLATELETS: 116 10*3/uL — AB (ref 150–400)
RBC: 3.57 MIL/uL — ABNORMAL LOW (ref 3.87–5.11)
RDW: 15.3 % (ref 11.5–15.5)
WBC: 5.3 10*3/uL (ref 4.0–10.5)

## 2016-08-27 LAB — TROPONIN I: TROPONIN I: 0.15 ng/mL — AB (ref <0.03)

## 2016-08-27 LAB — AMMONIA: Ammonia: 44 umol/L — ABNORMAL HIGH (ref 9–35)

## 2016-08-27 MED ORDER — LIP MEDEX EX OINT
TOPICAL_OINTMENT | CUTANEOUS | Status: DC | PRN
  Filled 2016-08-27: qty 7

## 2016-08-27 MED ORDER — IOPAMIDOL (ISOVUE-300) INJECTION 61%
INTRAVENOUS | Status: AC
  Filled 2016-08-27: qty 100

## 2016-08-27 MED ORDER — IOPAMIDOL (ISOVUE-370) INJECTION 76%
100.0000 mL | Freq: Once | INTRAVENOUS | Status: AC | PRN
  Administered 2016-08-27: 100 mL via INTRAVENOUS

## 2016-08-27 MED ORDER — IOPAMIDOL (ISOVUE-370) INJECTION 76%
INTRAVENOUS | Status: AC
  Filled 2016-08-27: qty 100

## 2016-08-27 NOTE — Progress Notes (Signed)
[facility] Therapy Treatment Record Phone [phone]   Radiation Therapy was administered to Tricia Hernandez on: 08/27/2016  2:24 PM and was treatment # 10 out of a planned course of 25 treatments.  Radiation Treatment  1). Beam photons with 6-10 energy  2). Brachytherapy None  3). Stereotactic Radiosurgery None  4). Other Radiation None     Kirstina Leinweber J, RT (T)

## 2016-08-27 NOTE — Progress Notes (Signed)
Nutrition Brief Note  Patient identified during rounds this AM.   Wt Readings from Last 15 Encounters:  08/26/16 243 lb 9.7 oz (110.5 kg)  08/23/16 247 lb 12.8 oz (112.4 kg)  08/19/16 249 lb 9.6 oz (113.2 kg)  08/16/16 251 lb 6.4 oz (114 kg)  08/02/16 261 lb 0.4 oz (118.4 kg)  07/22/16 245 lb (111.1 kg)    Body mass index is 41.82 kg/m. Patient meets criteria for morbidly obese based on current BMI. Skin WDL.  Current diet order is Heart Healthy. Visualized breakfast tray with 100% completion of yogurt and oatmeal untouched. Flow sheet review indicates pt had refused breakfast yesterday AM and no other intakes documented. Pt is very HOH and no family present. No muscle or fat wasting noted as RN and MD were moving pt.   Labs and medications reviewed.   Ensure Enlive has been ordered TID, each supplement provides 350 kcal and 20 grams of protein. No further nutrition interventions warranted at this time but RD will continue to monitor for possible needs. If nutrition issues arise, please consult RD.     Jarome Matin, MS, RD, LDN, [facility] Inpatient Clinical Dietitian Pager # [phone] After hours/weekend pager # [phone]

## 2016-08-27 NOTE — Progress Notes (Signed)
Hem-Onc Patient in CT Will see her tomorrow

## 2016-08-27 NOTE — Progress Notes (Signed)
PROGRESS NOTE    Tricia Hernandez  ZSM:[id number] DOB: [birth year] DOA: 08/25/2016 PCP: Clinton Quant, MD    Brief Narrative:  Tricia Hernandez is a 83 y.o. female with recently diagnosed anal cancer on radiation therapy and has had recent diverting colostomy was brought to the ER after patient was having persistent shortness of breath with subjective feeling of fever and chills and nausea. Patient's symptoms have been ongoing for last 6 days. In the ER chest x-ray shows opacity concerning for pneumonia. In the ER patient has required at least 4 L oxygen to maintain sats. Abdomen on exam appears benign. Patient is being admitted for IV antibiotics and further management.   ED Course: Patient was started on IV antibiotics for healthcare associated pneumonia. Chest x-ray shows opacity concerning for pneumonia.  Patient admitted and continued on IV antibiotics. She required more than her baseline oxygen (on 3L at home) and required 4-5L at present time.  Family agreed patient seemed slightly confused on 08/27/16 and as such labs were ordered and CT of the head ordered as well as CT of the chest (this was done at Dr. Geralyn Flash request).   Assessment & Plan:   Principal Problem:   Acute respiratory failure with hypoxia (HCC) Active Problems:   Pulmonary embolus (HCC)   Anal cancer (HCC)   Elevated troponin   Atrial fibrillation (HCC), CHA2DS2-VASc Score 5   Essential hypertension   HCAP (healthcare-associated pneumonia)  Acute respiratory failure with hypoxia  - most likely from pneumonia - elevated troponin likely from respiratory distress, atrial fibrillation - troponin appears to have plataeued - continue antibiotics or aztreonam and vancomycin - influenza PCR negative - follow blood cultures - CTA pending  Altered Mental Status - will check ABG - CT head ordered - ammonia level pending - BMP from this am stable from yesterday (and last hospitalization) - CBC showing low  platelets, H/H stable - urinalysis pending, urine culture ordered  Atrial fibrillation chads 2 vasc score of 5.  - on Apixaban and metoprolol  History of pulmonary embolism - currently on apixaban  Chronic anemia and thrombocytopenia  - follow CBC - H/H stable from last hospitalization  Recently diagnosed anal squamous cell carcinoma  - on radiation therapy  Lung nodules with history of carcinoid  - oncologist is planning to have PET scan done next week to see if patient has metastasis or known carcinoid - talked with Dr. Sonny Dandy who will see patient in consultation   DVT prophylaxis: Apixaban. Code Status: Full code.  Family Communication: Patient's daughter and son  Disposition Plan: Skilled nursing facility.   Consultants:   Oncologist  Procedures:   None  Antimicrobials:   Aztreonam  Vancomycin    Subjective: Patient seen during bedside rounds.  Very hard of hearing but per patient's children battery in her hearing aide was dead and was changed after rounds.  Patient daughter expresses this afternoon that she is concerned about patient's confusion.  Previously discussed this with patient daughter and son who voiced that patient seemed better today.  Even after hearing aide changed patient still slightly altered- not answering questions appropriately.     Objective: Vitals:   08/27/16 0540 08/27/16 0901 08/27/16 1300 08/27/16 1455  BP: 111/65  118/72   Pulse: 100  86   Resp: (!) 21  18   Temp: 98 F (36.7 C)  98.4 F (36.9 C)   TempSrc: Oral  Oral   SpO2: 98% 95% 97% 94%  Weight:  Height:        Intake/Output Summary (Last 24 hours) at 08/27/16 1701 Last data filed at 08/27/16 0255  Gross per 24 hour  Intake           2672.5 ml  Output              125 ml  Net           2547.5 ml   Filed Weights   08/25/16 2312 08/26/16 0237  Weight: 112.4 kg (247 lb 12.8 oz) 110.5 kg (243 lb 9.7 oz)    Examination:  General exam: Appears calm and  comfortable  Respiratory system: Crackles in left lung base and middle left lung, nasal cannula in place, slight increase work of breathing Cardiovascular system: S1 & S2 heard, irregularly irregular rate. No JVD, murmurs, rubs, gallops or clicks. No pedal edema. Gastrointestinal system: Abdomen is nondistended, soft and nontender. No organomegaly or masses felt. Normal bowel sounds heard. Central nervous system: Alert and oriented to self, very hard of hearing. CN II- XII GI. Extremities: Symmetric 5 x 5 power. Skin: No rashes, lesions or ulcers Psychiatry: Mood & affect appropriate. Questionable insight  Telemetry: atrial fibrillation  Data Reviewed: I have personally reviewed following labs and imaging studies  CBC:  Recent Labs Lab 08/25/16 2340 08/26/16 0558 08/27/16 1127  WBC 5.7 5.8 5.3  NEUTROABS 4.4 4.3 4.2  HGB 9.8* 10.0* 8.8*  HCT 34.4* 34.2* 31.2*  MCV 86.6 86.6 87.4  PLT 121* 109* 242*   Basic Metabolic Panel:  Recent Labs Lab 08/25/16 2340 08/26/16 0558 08/27/16 1127  NA 138 139 138  K 4.7 5.1 4.8  CL 94* 93* 95*  CO2 39* 38* 38*  GLUCOSE 148* 119* 123*  BUN '14 13 14  '$ CREATININE 0.64 0.67 0.61  CALCIUM 9.0 8.7* 8.7*   GFR: Estimated Creatinine Clearance: 64.8 mL/min (by C-G formula based on SCr of 0.61 mg/dL). Liver Function Tests:  Recent Labs Lab 08/26/16 0558  AST 34  ALT 23  ALKPHOS 58  BILITOT 0.4  PROT 6.6  ALBUMIN 3.1*   No results for input(s): LIPASE, AMYLASE in the last 168 hours. No results for input(s): AMMONIA in the last 168 hours. Coagulation Profile: No results for input(s): INR, PROTIME in the last 168 hours. Cardiac Enzymes:  Recent Labs Lab 08/25/16 2340 08/26/16 0558 08/26/16 1106 08/26/16 1518 08/27/16 1127  TROPONINI 0.15* 0.16* 0.16* 0.14* 0.15*   BNP (last 3 results) No results for input(s): PROBNP in the last 8760 hours. HbA1C: No results for input(s): HGBA1C in the last 72 hours. CBG: No results for  input(s): GLUCAP in the last 168 hours. Lipid Profile: No results for input(s): CHOL, HDL, LDLCALC, TRIG, CHOLHDL, LDLDIRECT in the last 72 hours. Thyroid Function Tests: No results for input(s): TSH, T4TOTAL, FREET4, T3FREE, THYROIDAB in the last 72 hours. Anemia Panel: No results for input(s): VITAMINB12, FOLATE, FERRITIN, TIBC, IRON, RETICCTPCT in the last 72 hours. Sepsis Labs:  Recent Labs Lab 08/26/16 0146  LATICACIDVEN 0.91    Recent Results (from the past 240 hour(s))  Blood Culture (routine x 2)     Status: None (Preliminary result)   Collection Time: 08/26/16  1:40 AM  Result Value Ref Range Status   Specimen Description BLOOD LEFT ARM  Final   Special Requests BOTTLES DRAWN AEROBIC AND ANAEROBIC 5ML  Final   Culture   Final    NO GROWTH 1 DAY Performed at [facility], [place],  [place] [address]    Report Status PENDING  Incomplete  Blood Culture (routine x 2)     Status: None (Preliminary result)   Collection Time: 08/26/16  1:45 AM  Result Value Ref Range Status   Specimen Description BLOOD RIGHT ARM  Final   Special Requests BOTTLES DRAWN AEROBIC AND ANAEROBIC 5ML  Final   Culture   Final    NO GROWTH 1 DAY Performed at [facility], [place] [address] [place], [place] [address]    Report Status PENDING  Incomplete  MRSA PCR Screening     Status: None   Collection Time: 08/26/16  3:11 AM  Result Value Ref Range Status   MRSA by PCR NEGATIVE NEGATIVE Final    Comment:        The GeneXpert MRSA Assay (FDA approved for NASAL specimens only), is one component of a comprehensive MRSA colonization surveillance program. It is not intended to diagnose MRSA infection nor to guide or monitor treatment for MRSA infections.   Respiratory Panel by PCR     Status: None   Collection Time: 08/26/16 10:00 AM  Result Value Ref Range Status   Adenovirus NOT DETECTED NOT DETECTED Final   Coronavirus 229E NOT DETECTED NOT DETECTED Final    Coronavirus HKU1 NOT DETECTED NOT DETECTED Final   Coronavirus NL63 NOT DETECTED NOT DETECTED Final   Coronavirus OC43 NOT DETECTED NOT DETECTED Final   Metapneumovirus NOT DETECTED NOT DETECTED Final   Rhinovirus / Enterovirus NOT DETECTED NOT DETECTED Final   Influenza A NOT DETECTED NOT DETECTED Final   Influenza B NOT DETECTED NOT DETECTED Final   Parainfluenza Virus 1 NOT DETECTED NOT DETECTED Final   Parainfluenza Virus 2 NOT DETECTED NOT DETECTED Final   Parainfluenza Virus 3 NOT DETECTED NOT DETECTED Final   Parainfluenza Virus 4 NOT DETECTED NOT DETECTED Final   Respiratory Syncytial Virus NOT DETECTED NOT DETECTED Final   Bordetella pertussis NOT DETECTED NOT DETECTED Final   Chlamydophila pneumoniae NOT DETECTED NOT DETECTED Final   Mycoplasma pneumoniae NOT DETECTED NOT DETECTED Final    Comment: Performed at [facility]         Radiology Studies: Dg Chest 2 View  Result Date: 08/26/2016 CLINICAL DATA:  Productive cough for 3-4 days with shortness of breath EXAM: CHEST  2 VIEW COMPARISON:  07/25/2016, 12 11,017 FINDINGS: Increased opacity in the left lower lobe, could reflect in infiltrate. Postsurgical changes of the right thorax with linear scarring in the right mid lung. Scattered pulmonary nodules are present in the right upper and lower lobes, questionable few new small nodules in the right upper lobe. Cardiomediastinal silhouette stable with atherosclerosis. No pneumothorax. IMPRESSION: 1. Increased opacity at the left lung base could relate to an infiltrate 2. Multiple small nodules in the right upper and lower lobes. Electronically Signed   By: Donavan Foil M.D.   On: 08/26/2016 00:38        Scheduled Meds: . apixaban  5 mg Oral BID  . atorvastatin  20 mg Oral Daily  . aztreonam  2 g Intravenous Q8H  . budesonide (PULMICORT) nebulizer solution  0.25 mg Nebulization BID  . calcium-vitamin D  1 tablet Oral Daily  . cholecalciferol  1,000 Units Oral  Daily  . feeding supplement (ENSURE ENLIVE)  237 mL Oral TID BM  . fenofibrate  160 mg Oral Daily  . ferrous sulfate  325 mg Oral BID WC  . fluticasone  1 spray Each Nare Daily  .  folic acid  624 mcg Oral Daily  . hydrocortisone  25 mg Rectal QHS  . Influenza vac split quadrivalent PF  0.5 mL Intramuscular Tomorrow-1000  . ipratropium-albuterol  3 mL Nebulization TID  . metoprolol  100 mg Oral BID  . montelukast  10 mg Oral Daily  . multivitamin with minerals  1 tablet Oral Daily  . pantoprazole  40 mg Oral Daily  . PARoxetine  20 mg Oral QHS  . pyridOXINE  100 mg Oral Daily  . vancomycin  1,250 mg Intravenous Q24H  . vitamin B-12  1,000 mcg Oral Daily   Continuous Infusions:    LOS: 1 day    Time spent: 30 minutes    Loretha Stapler, MD [facility] Pager [phone]  If 7PM-7AM, please contact night-coverage [email or web address] Password [email or web address] 08/27/2016, 5:01 PM

## 2016-08-28 ENCOUNTER — Ambulatory Visit: Admission: RE | Admit: 2016-08-28 | Payer: Medicare Other | Source: Ambulatory Visit

## 2016-08-28 LAB — COMPREHENSIVE METABOLIC PANEL
ALT: 19 U/L (ref 14–54)
AST: 23 U/L (ref 15–41)
Albumin: 2.9 g/dL — ABNORMAL LOW (ref 3.5–5.0)
Alkaline Phosphatase: 68 U/L (ref 38–126)
Anion gap: 6 (ref 5–15)
BUN: 16 mg/dL (ref 6–20)
CALCIUM: 8.9 mg/dL (ref 8.9–10.3)
CHLORIDE: 94 mmol/L — AB (ref 101–111)
CO2: 38 mmol/L — ABNORMAL HIGH (ref 22–32)
CREATININE: 0.52 mg/dL (ref 0.44–1.00)
Glucose, Bld: 132 mg/dL — ABNORMAL HIGH (ref 65–99)
Potassium: 4.3 mmol/L (ref 3.5–5.1)
Sodium: 138 mmol/L (ref 135–145)
Total Bilirubin: 0.6 mg/dL (ref 0.3–1.2)
Total Protein: 6.4 g/dL — ABNORMAL LOW (ref 6.5–8.1)

## 2016-08-28 LAB — URINALYSIS, ROUTINE W REFLEX MICROSCOPIC
Bilirubin Urine: NEGATIVE
GLUCOSE, UA: NEGATIVE mg/dL
Ketones, ur: NEGATIVE mg/dL
NITRITE: NEGATIVE
PH: 5 (ref 5.0–8.0)
Protein, ur: 30 mg/dL — AB
Specific Gravity, Urine: >1.046 — ABNORMAL HIGH (ref 1.005–1.030)

## 2016-08-28 LAB — CBC WITH DIFFERENTIAL/PLATELET
Basophils Absolute: 0 10*3/uL (ref 0.0–0.1)
Basophils Relative: 0 %
EOS PCT: 1 %
Eosinophils Absolute: 0.1 10*3/uL (ref 0.0–0.7)
HCT: 31.9 % — ABNORMAL LOW (ref 36.0–46.0)
Hemoglobin: 9.2 g/dL — ABNORMAL LOW (ref 12.0–15.0)
LYMPHS ABS: 0.6 10*3/uL — AB (ref 0.7–4.0)
LYMPHS PCT: 8 %
MCH: 24.7 pg — AB (ref 26.0–34.0)
MCHC: 28.8 g/dL — ABNORMAL LOW (ref 30.0–36.0)
MCV: 85.8 fL (ref 78.0–100.0)
MONO ABS: 0.6 10*3/uL (ref 0.1–1.0)
MONOS PCT: 9 %
Neutro Abs: 5.6 10*3/uL (ref 1.7–7.7)
Neutrophils Relative %: 82 %
PLATELETS: 119 10*3/uL — AB (ref 150–400)
RBC: 3.72 MIL/uL — AB (ref 3.87–5.11)
RDW: 15.3 % (ref 11.5–15.5)
WBC: 6.9 10*3/uL (ref 4.0–10.5)

## 2016-08-28 LAB — MAGNESIUM: MAGNESIUM: 1.8 mg/dL (ref 1.7–2.4)

## 2016-08-28 LAB — PHOSPHORUS: PHOSPHORUS: 2.4 mg/dL — AB (ref 2.5–4.6)

## 2016-08-28 MED ORDER — LORAZEPAM 0.5 MG PO TABS
0.5000 mg | ORAL_TABLET | Freq: Once | ORAL | Status: AC
  Administered 2016-08-28: 0.5 mg via ORAL
  Filled 2016-08-28: qty 1

## 2016-08-28 MED ORDER — SODIUM CHLORIDE 3 % IN NEBU
4.0000 mL | INHALATION_SOLUTION | Freq: Every day | RESPIRATORY_TRACT | Status: AC
  Administered 2016-08-28 – 2016-08-30 (×3): 4 mL via RESPIRATORY_TRACT
  Filled 2016-08-28: qty 15
  Filled 2016-08-28: qty 4
  Filled 2016-08-28: qty 15

## 2016-08-28 MED ORDER — METHYLPREDNISOLONE SODIUM SUCC 125 MG IJ SOLR
125.0000 mg | Freq: Once | INTRAMUSCULAR | Status: AC
  Administered 2016-08-28: 125 mg via INTRAVENOUS
  Filled 2016-08-28: qty 2

## 2016-08-28 MED ORDER — METHYLPREDNISOLONE SODIUM SUCC 40 MG IJ SOLR
40.0000 mg | Freq: Four times a day (QID) | INTRAMUSCULAR | Status: DC
  Administered 2016-08-28 – 2016-08-30 (×8): 40 mg via INTRAVENOUS
  Filled 2016-08-28 (×8): qty 1

## 2016-08-28 NOTE — Plan of Care (Signed)
Problem: Safety: Goal: Ability to remain free from injury will improve Outcome: Completed/Met Date Met: 08/28/16 Bed alarm set

## 2016-08-28 NOTE — NC FL2 (Signed)
[place] LEVEL OF CARE SCREENING TOOL     IDENTIFICATION  Patient Name: Tricia Hernandez Birthdate: 06-22-1931 Sex: female Admission Date (Current Location): 08/25/2016  [facility] and [place] Number:  Herbalist and Address:  [facility],  [place], [place]      Provider Number: [id number]  Attending Physician Name and Address:  Kerney Elbe, DO  Relative Name and Phone Number:       Current Level of Care: Hospital Recommended Level of Care: [place] Prior Approval Number:    Date Approved/Denied:   PASRR Number:   [id number]  Discharge Plan: SNF    Current Diagnoses: Patient Active Problem List   Diagnosis Date Noted  . HCAP (healthcare-associated pneumonia) 08/26/2016  . Acute respiratory failure with hypoxia ([place]) 08/26/2016  . Essential hypertension   . Lung nodule   . Palliative care encounter   . Shortness of breath 07/23/2016  . SOB (shortness of breath) 07/22/2016  . Symptomatic anemia 07/22/2016  . Anal cancer ([place]) 07/22/2016  . BRBPR (bright red blood per rectum) 07/22/2016  . Elevated troponin 07/22/2016  . Atrial fibrillation ([place]), CHA2DS2-VASc Score 5 07/22/2016  . Pulmonary embolus ([place])   . Obesity   . Lung cancer ([place])   . Hypertension     Orientation RESPIRATION BLADDER Height & Weight     Self  O2 (at 3L) Incontinent Weight: 243 lb 9.7 oz (110.5 kg) Height:  '5\' 4"'$  (162.6 cm)  BEHAVIORAL SYMPTOMS/MOOD NEUROLOGICAL BOWEL NUTRITION STATUS   (none )  ( none ) Incontinent Diet ([facility] )  AMBULATORY STATUS COMMUNICATION OF NEEDS Skin   Extensive Assist Verbally Normal                       Personal Care Assistance Level of Assistance  Bathing, Feeding, Dressing Bathing Assistance: Limited assistance Feeding assistance: Limited assistance Dressing Assistance: Limited assistance     Functional Limitations Info  Speech, Hearing, Sight Sight Info:  Adequate Hearing Info: Impaired Speech Info: Adequate    SPECIAL CARE FACTORS FREQUENCY  PT (By licensed PT), OT (By licensed OT)     PT Frequency: 3 OT Frequency: 2            Contractures      Additional Factors Info  Code Status, Allergies Code Status Info: FULL CODE Allergies Info: Aleve Naproxen Sodium, Antihistamines, Chlorpheniramine-type, Aspirin, Ciprofloxacin, Codeine, Penicillins, Rocephin Ceftriaxone Sodium In Dextrose, Sulfa Antibiotics, Hydrochlorothiazide           Current Medications (08/28/2016):  This is the current hospital active medication list Current Facility-Administered Medications  Medication Dose Route Frequency Provider Last Rate Last Dose  . acetaminophen (TYLENOL) tablet 650 mg  650 mg Oral Q6H PRN Rise Patience, MD       Or  . acetaminophen (TYLENOL) suppository 650 mg  650 mg Rectal Q6H PRN Rise Patience, MD      . apixaban (ELIQUIS) tablet 5 mg  5 mg Oral BID Rise Patience, MD   5 mg at 08/27/16 2054  . atorvastatin (LIPITOR) tablet 20 mg  20 mg Oral Daily Rise Patience, MD   20 mg at 08/26/16 1817  . aztreonam (AZACTAM) 2 GM IVPB  2 g Intravenous Q8H Dorrene German, RPH 100 mL/hr at 08/28/16 0215 2 g at 08/28/16 0215  . budesonide (PULMICORT) nebulizer solution 0.25 mg  0.25 mg Nebulization BID Rise Patience, MD  0.25 mg at 08/28/16 0942  . calcium-vitamin D (OSCAL WITH D) 500-200 MG-UNIT per tablet 1 tablet  1 tablet Oral Daily Rise Patience, MD   1 tablet at 08/27/16 1115  . cholecalciferol (VITAMIN D) tablet 1,000 Units  1,000 Units Oral Daily Rise Patience, MD   1,000 Units at 08/27/16 1114  . feeding supplement (ENSURE ENLIVE) (ENSURE ENLIVE) liquid 237 mL  237 mL Oral TID BM Rise Patience, MD   237 mL at 08/27/16 2057  . fenofibrate tablet 160 mg  160 mg Oral Daily Rise Patience, MD   160 mg at 08/27/16 1114  . ferrous sulfate tablet 325 mg  325 mg Oral BID WC Rise Patience,  MD   325 mg at 08/27/16 0829  . fluticasone (FLONASE) 50 MCG/ACT nasal spray 1 spray  1 spray Each Nare Daily Rise Patience, MD   1 spray at 08/27/16 1116  . folic acid (FOLVITE) tablet 0.5 mg  500 mcg Oral Daily Rise Patience, MD   0.5 mg at 08/27/16 1115  . HYDROcodone-acetaminophen (NORCO/VICODIN) 5-325 MG per tablet 1 tablet  1 tablet Oral Q4H PRN Rise Patience, MD      . hydrocortisone (ANUSOL-HC) suppository 25 mg  25 mg Rectal QHS Rise Patience, MD   25 mg at 08/27/16 2111  . Influenza vac split quadrivalent PF (FLUARIX) injection 0.5 mL  0.5 mL Intramuscular Tomorrow-1000 Rise Patience, MD      . ipratropium-albuterol (DUONEB) 0.5-2.5 (3) MG/3ML nebulizer solution 3 mL  3 mL Nebulization Q2H PRN Rise Patience, MD   3 mL at 08/28/16 0335  . ipratropium-albuterol (DUONEB) 0.5-2.5 (3) MG/3ML nebulizer solution 3 mL  3 mL Nebulization TID Eber Jones, MD   3 mL at 08/28/16 0943  . lip balm (CARMEX) ointment   Topical PRN Eber Jones, MD      . methylPREDNISolone sodium succinate (SOLU-MEDROL) 125 mg/2 mL injection 125 mg  125 mg Intravenous Once [facility], DO      . methylPREDNISolone sodium succinate (SOLU-MEDROL) 40 mg/mL injection 40 mg  40 mg Intravenous Q6H Omair Latif Sheikh, DO      . metoprolol (LOPRESSOR) tablet 100 mg  100 mg Oral BID Rise Patience, MD   100 mg at 08/27/16 2055  . montelukast (SINGULAIR) tablet 10 mg  10 mg Oral Daily Rise Patience, MD   10 mg at 08/27/16 1115  . multivitamin with minerals tablet 1 tablet  1 tablet Oral Daily Rise Patience, MD   1 tablet at 08/27/16 1114  . ondansetron (ZOFRAN) tablet 4 mg  4 mg Oral Q6H PRN Rise Patience, MD       Or  . ondansetron [facility]) injection 4 mg  4 mg Intravenous Q6H PRN Rise Patience, MD   4 mg at 08/26/16 1039  . ondansetron (ZOFRAN) tablet 8 mg  8 mg Oral Q6H PRN Rise Patience, MD      . pantoprazole (PROTONIX) EC tablet 40  mg  40 mg Oral Daily Rise Patience, MD   40 mg at 08/27/16 1116  . PARoxetine (PAXIL) tablet 20 mg  20 mg Oral QHS Rise Patience, MD   20 mg at 08/27/16 2054  . pyridOXINE (VITAMIN B-6) tablet 100 mg  100 mg Oral Daily Rise Patience, MD   100 mg at 08/27/16 1114  . sodium chloride HYPERTONIC 3 % nebulizer solution 4 mL  4 mL Nebulization Daily [place], DO      . vancomycin (VANCOCIN) 1,250 mg in sodium chloride 0.9 % 250 mL IVPB  1,250 mg Intravenous Q24H Dorrene German, RPH   1,250 mg at 08/28/16 0717  . vitamin B-12 (CYANOCOBALAMIN) tablet 1,000 mcg  1,000 mcg Oral Daily Rise Patience, MD   1,000 mcg at 08/27/16 1114     Discharge Medications: Please see discharge summary for a list of discharge medications.  Relevant Imaging Results:  Relevant Lab Results:   Additional Information SSN [id number]  Rozell Searing

## 2016-08-28 NOTE — Progress Notes (Signed)
Called to [facility] by therapist. Several failed attempts have been made to treat the patient. Patient confused and unable to remain still for treatment. Patient found to be tachycardiac with elevated BP. Per Dr. Lisbeth Renshaw treatment cancelled. Phoned Ernestine Conrad, RN on [address] and informed her of abnormalities. She verbalized understanding.

## 2016-08-28 NOTE — Progress Notes (Signed)
PROGRESS NOTE    Tricia Hernandez  EYC:[id number] DOB: 07-27-1931 DOA: 08/25/2016 PCP: Clinton Quant, MD   Brief Narrative:   83 y.o. female with recently diagnosed anal cancer on radiation therapy and has had recent diverting colostomy was brought to the ER after patient was having persistent shortness of breath with subjective feeling of fever and chills and nausea. Patient admitted and continued on IV antibiotics. She required more than her baseline oxygen (on 3L at home) and required 4-5L at present time.  Family agreed patient seemed slightly confused on 08/27/16 and as such labs were ordered and CT of the head ordered as well as CT of the chest (this was done at Dr. Geralyn Flash request).  Assessment & Plan:   Principal Problem:   Acute respiratory failure with hypoxia (HCC) Active Problems:   Pulmonary embolus (HCC)   Anal cancer (HCC)   Elevated troponin   Atrial fibrillation ([place]), CHA2DS2-VASc Score 5   Essential hypertension   HCAP (healthcare-associated pneumonia)  Acute respiratory failure with hypoxia likely from PNA - Was 84% on 2 Liters on Admission. Improved to 96% on 3 Liters - continue antibiotics or aztreonam and vancomycin - influenza PCR negative - follow blood cultures - Respiratory Panel Negative - Continue Nebs with Budesonide and scheduled and prn DuoNeb - Abx Coverage with Aztrenam and Vancomycin - CTA was obtained and showed nodular ground glass densities known in past. Carcinoid vs. Metastasis per notes - Started IV Steroids with Solumedrol 125 Once and then 40 mg IV q6h  Elevated Troponins - elevated troponin likely from respiratory distress and hypoxia, atrial fibrillation - troponin appear Flat - Unlikely ACS at this point. Will continue to Monitor and low threshold for Cardiac Eval  Altered Mental Status - Improved. Knew she was in the hospital - will check ABG - CT head showed atrophy and chronic microvascular ischemia with no acute abnormality  and mucosal edema in the sphenoid sinus - Ammonia level 44 - U/A not specific and Urine Cx pending   Atrial Fibrillation  - chads 2 vasc score of 5.  - on Apixaban and metoprolol 100 mg po BID  History of Pulmonary Embolism - Currently on apixaban  Chronic Anemia and Thrombocytopenia  - follow CBC - H/H stable from last hospitalization  Recently diagnosed anal squamous cell carcinoma  - on radiation therapy; Unable to go today  Lung nodules with history of carcinoid  - oncologist is planning to have PET scan done next week to see if patient has metastasis or known carcinoid - talked with Dr. Sonny Dandy who will see patient in consultation - CT Chest as above - Appreciate Oncology Evaluation   DVT prophylaxis: Apixaban. Code Status: Full code.  Family Communication: Patient's daughter at bedside Disposition Plan: Skilled nursing facility.   Consultants:   Oncology Dr. Lindi Adie  Procedures:   None  Antimicrobials:   IV Aztreonam  IV Vancomycin   Subjective: Was improved per daughter. Still diffusely wheezing and coughing up sputum. No CP and SOB improving. No Lightheadedness or Dizziness. No other concerns or complaints at this time.   Objective: Vitals:   08/28/16 1001 08/28/16 1126 08/28/16 1400 08/28/16 1444  BP: 129/81  (!) 143/74   Pulse: (!) 130  94   Resp: (!) 24     Temp:      TempSrc:      SpO2: 95% 95% 98% 96%  Weight:      Height:        Intake/Output Summary (Last 24  hours) at 08/28/16 1704 Last data filed at 08/28/16 1610  Gross per 24 hour  Intake              480 ml  Output               50 ml  Net              430 ml   Filed Weights   08/25/16 2312 08/26/16 0237  Weight: 112.4 kg (247 lb 12.8 oz) 110.5 kg (243 lb 9.7 oz)    Examination:  General exam: Appears calm and comfortable  Respiratory system: Bilateral crackles and wheezing; Slightly increased work of breathing wearing supplemental O2 via [place].  Cardiovascular system: S1 & S2  heard, irregularly irregular with tachycardic rate. No JVD, murmurs, rubs, gallops or clicks. No pedal edema. Gastrointestinal system: Abdomen is nondistended, soft and nontender. No organomegaly or masses felt. Normal bowel sounds heard x4. Colostomy in place.  Central nervous system: Alert and oriented to self, place and year. CN showed no focal deficits but is slightly hard of hearing.  Extremities: No joint deformities.  Skin: No rashes, lesions or ulcers Psychiatry: Mood & affect appropriate.  BedsideTelemetry: atrial fibrillation with a rate ranging from 110-130's  Data Reviewed: I have personally reviewed following labs and imaging studies  CBC:  Recent Labs Lab 08/25/16 2340 08/26/16 0558 08/27/16 1127 08/28/16 0954  WBC 5.7 5.8 5.3 6.9  NEUTROABS 4.4 4.3 4.2 5.6  HGB 9.8* 10.0* 8.8* 9.2*  HCT 34.4* 34.2* 31.2* 31.9*  MCV 86.6 86.6 87.4 85.8  PLT 121* 109* 116* 960*   Basic Metabolic Panel:  Recent Labs Lab 08/25/16 2340 08/26/16 0558 08/27/16 1127 08/28/16 0954  NA 138 139 138 138  K 4.7 5.1 4.8 4.3  CL 94* 93* 95* 94*  CO2 39* 38* 38* 38*  GLUCOSE 148* 119* 123* 132*  BUN '14 13 14 16  '$ CREATININE 0.64 0.67 0.61 0.52  CALCIUM 9.0 8.7* 8.7* 8.9  MG  --   --   --  1.8  PHOS  --   --   --  2.4*   GFR: Estimated Creatinine Clearance: 64.8 mL/min (by C-G formula based on SCr of 0.52 mg/dL). Liver Function Tests:  Recent Labs Lab 08/26/16 0558 08/28/16 0954  AST 34 23  ALT 23 19  ALKPHOS 58 68  BILITOT 0.4 0.6  PROT 6.6 6.4*  ALBUMIN 3.1* 2.9*   No results for input(s): LIPASE, AMYLASE in the last 168 hours.  Recent Labs Lab 08/27/16 1618  AMMONIA 44*   Coagulation Profile: No results for input(s): INR, PROTIME in the last 168 hours. Cardiac Enzymes:  Recent Labs Lab 08/25/16 2340 08/26/16 0558 08/26/16 1106 08/26/16 1518 08/27/16 1127  TROPONINI 0.15* 0.16* 0.16* 0.14* 0.15*   BNP (last 3 results) No results for input(s): PROBNP in  the last 8760 hours. HbA1C: No results for input(s): HGBA1C in the last 72 hours. CBG: No results for input(s): GLUCAP in the last 168 hours. Lipid Profile: No results for input(s): CHOL, HDL, LDLCALC, TRIG, CHOLHDL, LDLDIRECT in the last 72 hours. Thyroid Function Tests: No results for input(s): TSH, T4TOTAL, FREET4, T3FREE, THYROIDAB in the last 72 hours. Anemia Panel: No results for input(s): VITAMINB12, FOLATE, FERRITIN, TIBC, IRON, RETICCTPCT in the last 72 hours. Sepsis Labs:  Recent Labs Lab 08/26/16 0146  LATICACIDVEN 0.91    Recent Results (from the past 240 hour(s))  Blood Culture (routine x 2)     Status: None (Preliminary  result)   Collection Time: 08/26/16  1:40 AM  Result Value Ref Range Status   Specimen Description BLOOD LEFT ARM  Final   Special Requests BOTTLES DRAWN AEROBIC AND ANAEROBIC 5ML  Final   Culture   Final    NO GROWTH 2 DAYS Performed at [facility], [address] [place], [place] [address]    Report Status PENDING  Incomplete  Blood Culture (routine x 2)     Status: None (Preliminary result)   Collection Time: 08/26/16  1:45 AM  Result Value Ref Range Status   Specimen Description BLOOD RIGHT ARM  Final   Special Requests BOTTLES DRAWN AEROBIC AND ANAEROBIC 5ML  Final   Culture   Final    NO GROWTH 2 DAYS Performed at [facility], [place] [address] [place], [place] [address]    Report Status PENDING  Incomplete  MRSA PCR Screening     Status: None   Collection Time: 08/26/16  3:11 AM  Result Value Ref Range Status   MRSA by PCR NEGATIVE NEGATIVE Final    Comment:        The GeneXpert MRSA Assay (FDA approved for NASAL specimens only), is one component of a comprehensive MRSA colonization surveillance program. It is not intended to diagnose MRSA infection nor to guide or monitor treatment for MRSA infections.   Respiratory Panel by PCR     Status: None   Collection Time: 08/26/16 10:00 AM  Result Value Ref Range  Status   Adenovirus NOT DETECTED NOT DETECTED Final   Coronavirus 229E NOT DETECTED NOT DETECTED Final   Coronavirus HKU1 NOT DETECTED NOT DETECTED Final   Coronavirus NL63 NOT DETECTED NOT DETECTED Final   Coronavirus OC43 NOT DETECTED NOT DETECTED Final   Metapneumovirus NOT DETECTED NOT DETECTED Final   Rhinovirus / Enterovirus NOT DETECTED NOT DETECTED Final   Influenza A NOT DETECTED NOT DETECTED Final   Influenza B NOT DETECTED NOT DETECTED Final   Parainfluenza Virus 1 NOT DETECTED NOT DETECTED Final   Parainfluenza Virus 2 NOT DETECTED NOT DETECTED Final   Parainfluenza Virus 3 NOT DETECTED NOT DETECTED Final   Parainfluenza Virus 4 NOT DETECTED NOT DETECTED Final   Respiratory Syncytial Virus NOT DETECTED NOT DETECTED Final   Bordetella pertussis NOT DETECTED NOT DETECTED Final   Chlamydophila pneumoniae NOT DETECTED NOT DETECTED Final   Mycoplasma pneumoniae NOT DETECTED NOT DETECTED Final    Comment: Performed at [facility]         Radiology Studies: Ct Head W & Wo Contrast  Result Date: 08/27/2016 CLINICAL DATA:  Confusion.  History of lung cancer. EXAM: CT HEAD WITHOUT AND WITH CONTRAST TECHNIQUE: Contiguous axial images were obtained from the base of the skull through the vertex without and with intravenous contrast CONTRAST:  100 mL Isovue 370 IV COMPARISON:  None. FINDINGS: Brain: Mild to moderate atrophy. Negative for hydrocephalus. Mild hypodensity in the periventricular white matter bilaterally. No acute infarct. Negative for hemorrhage or mass lesion. Normal enhancement following contrast infusion. No enhancing metastatic deposits in the brain. Vascular: Normal arterial and venous enhancement. Skull: Negative Sinuses/Orbits: Diffuse mucosal thickening sphenoid sinus. Remaining sinuses clear. Bilateral lens replacement. Other: None IMPRESSION: Atrophy and chronic microvascular ischemia.  No acute abnormality. Mucosal edema in the sphenoid sinus. Electronically  Signed   By: Franchot Gallo M.D.   On: 08/27/2016 18:57   Ct Angio Chest Pe W Or Wo Contrast  Result Date: 08/27/2016 CLINICAL DATA:  Recently  diagnosed anal cancer on radiation therapy, status post diverting colostomy. Now with shortness of breath, fever, chills and nausea. Chest x-ray earlier today shows opacity concerning for pneumonia. Additional history of previous partial right lung removal for mass/carcinoid. EXAM: CT ANGIOGRAPHY CHEST WITH CONTRAST TECHNIQUE: Multidetector CT imaging of the chest was performed using the standard protocol during bolus administration of intravenous contrast. Multiplanar CT image reconstructions and MIPs were obtained to evaluate the vascular anatomy. CONTRAST:  100 cc Isovue 370 COMPARISON:  None. FINDINGS: Cardiovascular: Some of the peripheral segmental and subsegmental pulmonary artery branches are difficult to definitively characterize due to patient breathing motion artifact but there is no convincing pulmonary embolism identified within the main, lobar or central segmental pulmonary arteries. Thoracic aorta is normal in caliber, with scattered atherosclerosis. No aortic dissection. There is mild cardiomegaly.  No pericardial effusion. Mediastinum/Nodes: No mass or pathologically enlarged lymph nodes are identified within the mediastinum or perihilar regions. Largest lymph nodes identified within the right lower paratracheal space and aortopulmonary window region are within normal limits at 8-9 mm short axis dimension. Esophagus appears normal. Trachea and central bronchi are unremarkable. Lungs/Pleura: Small right pleural effusion. Moderate-sized left pleural effusion. Postsurgical changes within the right perihilar region/right middle lobe compatible with given history of previous partial lobectomy for mass/carcinoid, with presumably associated atelectasis/airspace collapse extending towards the lower hilum. Additional patchy small nodular and ground-glass density  consolidations are seen throughout the periphery of both lungs, densest component within the right lower lobe. Upper Abdomen: No acute findings. Musculoskeletal: Mild degenerative change throughout the thoracic spine. No acute or suspicious osseous finding. Review of the MIP images confirms the above findings. IMPRESSION: 1. Postsurgical changes within the right perihilar lung extending towards the right middle lobe, compatible with given history of previous partial lobectomy for mass/carcinoid, with presumably associated atelectasis/airspace collapse extending towards the lower right hilum. 2. Additional patchy nodular and ground-glass density consolidations throughout the periphery of both lungs, with densest component in the right lower lobe. Differential consideration includes both neoplastic and infectious etiologies. Favor pneumonia, perhaps atypical pneumonia such as fungal or viral. Recommend follow-up chest CT to ensure resolution, perhaps in 4-6 weeks. If there is not complete resolution, PET-CT may be needed for further characterization. 3. Moderate left pleural effusion and small right pleural effusion. 4. Cardiomegaly. 5. No pulmonary embolism seen, with mild study limitations detailed above. 6. Aortic atherosclerosis. Electronically Signed   By: Franki Cabot M.D.   On: 08/27/2016 19:11   Scheduled Meds: . apixaban  5 mg Oral BID  . atorvastatin  20 mg Oral Daily  . aztreonam  2 g Intravenous Q8H  . budesonide (PULMICORT) nebulizer solution  0.25 mg Nebulization BID  . calcium-vitamin D  1 tablet Oral Daily  . cholecalciferol  1,000 Units Oral Daily  . feeding supplement (ENSURE ENLIVE)  237 mL Oral TID BM  . fenofibrate  160 mg Oral Daily  . ferrous sulfate  325 mg Oral BID WC  . fluticasone  1 spray Each Nare Daily  . folic acid  086 mcg Oral Daily  . hydrocortisone  25 mg Rectal QHS  . Influenza vac split quadrivalent PF  0.5 mL Intramuscular Tomorrow-1000  . ipratropium-albuterol  3  mL Nebulization TID  . methylPREDNISolone (SOLU-MEDROL) injection  40 mg Intravenous Q6H  . metoprolol  100 mg Oral BID  . montelukast  10 mg Oral Daily  . multivitamin with minerals  1 tablet Oral Daily  . pantoprazole  40 mg Oral Daily  .  PARoxetine  20 mg Oral QHS  . pyridOXINE  100 mg Oral Daily  . sodium chloride HYPERTONIC  4 mL Nebulization Daily  . vancomycin  1,250 mg Intravenous Q24H  . vitamin B-12  1,000 mcg Oral Daily   Continuous Infusions:    LOS: 2 days   Kerney Elbe, DO [facility] Pager [phone]   If 7PM-7AM, please contact night-coverage [email or web address] Password [email or web address] 08/28/2016, 5:04 PM

## 2016-08-28 NOTE — Clinical Social Work Note (Signed)
Clinical Social Work Assessment  Patient Details  Name: Tricia Hernandez MRN: [id number] Date of Birth: November 13, 1930  Date of referral:  08/28/16               Reason for consult:  Discharge Planning                Permission sought to share information with:  Family Supports, Customer service manager, Case Manager Permission granted to share information::  Yes, Verbal Permission Granted  Name::      Darnise Montag )  Agency::   [facility] )  Relationship::   (Daughter )  Contact Information:   [phone])  Housing/Transportation Living arrangements for the past 2 months:  [place] of Information:  Adult Children Patient Interpreter Needed:  None Criminal Activity/Legal Involvement Pertinent to Current Situation/Hospitalization:  No - Comment as needed Significant Relationships:  Adult Children Lives with:  Facility Resident Do you feel safe going back to the place where you live?  Yes Need for family participation in patient care:  No (Coment)  Care giving concerns:  Patient admitted from SNF, [facility] and plans to return once stable.    Social Worker assessment / plan:  MSW met with patient and dtr, Collie Siad present at bedside. MSW introduced MSW role and confirmed that patient was admitted from [facility]. Patient's dtr reported that patient was admitted into SNF on 12/22 and would like for patient to return to facility once medically stable for dc. Pt's dtr reported that she is very pleased with facility.  No further concerns reported at this time. MSW remains available as needed.   Employment status:  Retired Forensic scientist:  Medicare PT Recommendations:  Not assessed at this time Information / Referral to community resources:   (return to SNF )  Patient/Family's Response to care:  Patient lying in bed and confused. Patient's dtr sitting at bedside and is agreeable to pt's return to SNF once stable. Pt's dtr strongly involved in pt's care  and appreciated sw intervention.   Patient/Family's Understanding of and Emotional Response to Diagnosis, Current Treatment, and Prognosis:  Dtr knowledgeable of current medical interventions.   Emotional Assessment Appearance:    Attitude/Demeanor/Rapport:  Unable to Assess Affect (typically observed):  Unable to Assess Orientation:  Oriented to Self Alcohol / Substance use:  Not Applicable Psych involvement (Current and /or in the community):  No (Comment)  Discharge Needs  Concerns to be addressed:  Denies Needs/Concerns at this time Readmission within the last 30 days:  Yes Current discharge risk:  Dependent with Mobility Barriers to Discharge:  Continued Medical Work up   [employer], MSW [phone] 08/28/2016 10:36 AM

## 2016-08-29 ENCOUNTER — Ambulatory Visit
Admission: RE | Admit: 2016-08-29 | Discharge: 2016-08-29 | Disposition: A | Discharge status: Home / Self Care | Payer: Medicare Other | Source: Ambulatory Visit | Attending: Radiation Oncology | Admitting: Radiation Oncology

## 2016-08-29 DIAGNOSIS — R0600 Dyspnea, unspecified: Secondary | ICD-10-CM

## 2016-08-29 DIAGNOSIS — C210 Malignant neoplasm of anus, unspecified: Secondary | ICD-10-CM

## 2016-08-29 DIAGNOSIS — J189 Pneumonia, unspecified organism: Principal | ICD-10-CM

## 2016-08-29 DIAGNOSIS — R911 Solitary pulmonary nodule: Secondary | ICD-10-CM

## 2016-08-29 LAB — CBC WITH DIFFERENTIAL/PLATELET
BASOS ABS: 0 10*3/uL (ref 0.0–0.1)
Basophils Relative: 0 %
EOS ABS: 0 10*3/uL (ref 0.0–0.7)
EOS PCT: 0 %
HCT: 30.9 % — ABNORMAL LOW (ref 36.0–46.0)
Hemoglobin: 9 g/dL — ABNORMAL LOW (ref 12.0–15.0)
Lymphocytes Relative: 8 %
Lymphs Abs: 0.4 10*3/uL — ABNORMAL LOW (ref 0.7–4.0)
MCH: 24.5 pg — AB (ref 26.0–34.0)
MCHC: 29.1 g/dL — ABNORMAL LOW (ref 30.0–36.0)
MCV: 84 fL (ref 78.0–100.0)
Monocytes Absolute: 0.2 10*3/uL (ref 0.1–1.0)
Monocytes Relative: 4 %
Neutro Abs: 4.7 10*3/uL (ref 1.7–7.7)
Neutrophils Relative %: 88 %
PLATELETS: 135 10*3/uL — AB (ref 150–400)
RBC: 3.68 MIL/uL — AB (ref 3.87–5.11)
RDW: 15.5 % (ref 11.5–15.5)
WBC: 5.4 10*3/uL (ref 4.0–10.5)

## 2016-08-29 LAB — COMPREHENSIVE METABOLIC PANEL
ALBUMIN: 2.8 g/dL — AB (ref 3.5–5.0)
ALK PHOS: 64 U/L (ref 38–126)
ALT: 19 U/L (ref 14–54)
AST: 20 U/L (ref 15–41)
Anion gap: 7 (ref 5–15)
BUN: 17 mg/dL (ref 6–20)
CALCIUM: 8.9 mg/dL (ref 8.9–10.3)
CO2: 39 mmol/L — ABNORMAL HIGH (ref 22–32)
CREATININE: 0.56 mg/dL (ref 0.44–1.00)
Chloride: 93 mmol/L — ABNORMAL LOW (ref 101–111)
GFR calc Af Amer: >60 mL/min (ref >60)
GFR calc non Af Amer: >60 mL/min (ref >60)
GLUCOSE: 142 mg/dL — AB (ref 65–99)
Potassium: 4.6 mmol/L (ref 3.5–5.1)
Sodium: 139 mmol/L (ref 135–145)
TOTAL PROTEIN: 6.4 g/dL — AB (ref 6.5–8.1)
Total Bilirubin: 0.3 mg/dL (ref 0.3–1.2)

## 2016-08-29 LAB — MAGNESIUM: Magnesium: 1.8 mg/dL (ref 1.7–2.4)

## 2016-08-29 LAB — EXPECTORATED SPUTUM ASSESSMENT W GRAM STAIN, RFLX TO RESP C

## 2016-08-29 LAB — URINE CULTURE: Culture: 20000 — AB

## 2016-08-29 LAB — EXPECTORATED SPUTUM ASSESSMENT W REFEX TO RESP CULTURE

## 2016-08-29 LAB — PHOSPHORUS: Phosphorus: 2.7 mg/dL (ref 2.5–4.6)

## 2016-08-29 MED ORDER — DOXYCYCLINE HYCLATE 100 MG PO TABS
100.0000 mg | ORAL_TABLET | Freq: Two times a day (BID) | ORAL | Status: DC
  Administered 2016-08-29 – 2016-09-02 (×9): 100 mg via ORAL
  Filled 2016-08-29 (×9): qty 1

## 2016-08-29 MED ORDER — DOXYCYCLINE HYCLATE 100 MG PO TABS: 100.0000 mg | ORAL_TABLET | Freq: Two times a day (BID) | ORAL | Status: DC

## 2016-08-29 NOTE — Progress Notes (Signed)
PROGRESS NOTE    Tricia Hernandez  BJS:[id number] DOB: 07-27-1931 DOA: 08/25/2016 PCP: Clinton Quant, MD   Brief Narrative:   83 y.o. female with recently diagnosed anal cancer on radiation therapy and has had recent diverting colostomy was brought to the ER after patient was having persistent shortness of breath with subjective feeling of fever and chills and nausea. Patient admitted and continued on IV antibiotics. She required more than her baseline oxygen (on 3L at home) and required 4-5L at present time.  Family agreed patient seemed slightly confused on 08/27/16 and as such labs were ordered and CT of the head ordered as well as CT of the chest (this was done at Dr. Geralyn Flash request). Patient found to have a Pneumonia and being treated with Steroids and Abx. Patient improved today and went down for her radiation treatment.   Assessment & Plan:   Principal Problem:   Acute respiratory failure with hypoxia (HCC) Active Problems:   Pulmonary embolus (HCC)   Anal cancer (HCC)   Elevated troponin   Atrial fibrillation ([place]), CHA2DS2-VASc Score 5   Essential hypertension   HCAP (healthcare-associated pneumonia)  Acute respiratory failure with hypoxia likely from PNA - Was 84% on 2 Liters on Admission. Improved to 96% on 3 Liters yesterday - C/w Aztreonam; D/C'd Vancomycin as MRSA was negative; Started po Doxycycline for Atypical Coverage - Influenza PCR Negative - Follow blood cultures; Showed NGTD x 2 days - Respiratory Panel Negative - Continue Nebs with Budesonide and scheduled and prn DuoNeb - CTA was obtained and showed bilateral patchy nodular ground glass densities/consolidations in Right > Left Lung; Has known in past nodular densities in past. Carcinoid vs. Metastasis per notes - Started IV Steroids with Solumedrol 125 Once and then 40 mg IV q6h; C/w 40 mg IV q6h today and wean to 40 q12h tomorrow as patient was still significantly wheezing  - Continue with Flutter Valve -  Will Send for Fungal Respiratory Cx per Oncology request - Continue to Monitor closlety  Elevated Troponins - elevated troponin likely from respiratory distress and hypoxia, atrial fibrillation - troponin appear Flat - Unlikely ACS at this point. Will continue to Monitor and low threshold for Cardiac Eval  Altered Mental Status - Much Improved. - CT head showed atrophy and chronic microvascular ischemia with no acute abnormality and mucosal edema in the sphenoid sinus - Ammonia level 44 - U/A not specific and Urine Cx showed 20,000 CFU of Yeast  Atrial Fibrillation  - chads 2 vasc score of 5.  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 Lindi Adie who will see patient in consultation and additional recc's - CT Chest as above  DVT prophylaxis: Apixaban. Code Status: Full code.  Family Communication: No Family present at bedside Disposition Plan: Skilled nursing facility when Stable for D/C;   Consultants:   Oncology Dr. Lindi Adie  Procedures:   None  Antimicrobials:  Anti-infectives    Start     Dose/Rate Route Frequency Ordered  Stop   08/29/16 1400  doxycycline (VIBRA-TABS) tablet 100 mg     100 mg Oral 2 times daily 08/29/16 1334     08/29/16 1200  doxycycline (VIBRA-TABS) tablet 100 mg  Status:  Discontinued     100 mg Oral 2 times daily 08/29/16 1031 08/29/16 1333   08/27/16 0600  vancomycin (VANCOCIN) 1,250 mg in sodium chloride 0.9 % 250 mL IVPB   Status:  Discontinued     1,250 mg 166.7 mL/hr over 90 Minutes Intravenous Every 24 hours 08/26/16 0328 08/29/16 1031   08/26/16 1000  aztreonam (AZACTAM) 2 GM IVPB  Status:  Discontinued     2 g 100 mL/hr over 30 Minutes Intravenous Every 8 hours 08/26/16 0328 08/26/16 0938   08/26/16 0200  aztreonam (AZACTAM) 2 GM IVPB     2 g 100 mL/hr over 30 Minutes Intravenous Every 8 hours 08/26/16 0147     08/26/16 0130  vancomycin (VANCOCIN) 2,000 mg in sodium chloride 0.9 % 500 mL IVPB     2,000 mg 250 mL/hr over 120 Minutes Intravenous  Once 08/26/16 0122 08/26/16 0505   08/26/16 0115  aztreonam (AZACTAM) 2 g in dextrose 5 % 50 mL IVPB  Status:  Discontinued     2 g 100 mL/hr over 30 Minutes Intravenous  Once 08/26/16 0107 08/26/16 0142   08/26/16 0115  vancomycin (VANCOCIN) IVPB 1000 mg/200 mL premix  Status:  Discontinued     1,000 mg 200 mL/hr over 60 Minutes Intravenous  Once 08/26/16 0107 08/26/16 0122       Subjective: States she felt better today. Has been tolerating diet well and expectorating phlegm more today. Confusion and encephalopathy has resolved. No family present at bedside. No other complaints from patient.   Objective: Vitals:   08/29/16 0926 08/29/16 0932 08/29/16 0938 08/29/16 1127  BP:   (!) 141/88 (!) 139/94  Pulse:   (!) 114 98  Resp:    18  Temp:    98 F (36.7 C)  TempSrc:    Oral  SpO2: 93% 93%  95%  Weight:      Height:        Intake/Output Summary (Last 24 hours) at 08/29/16 1412 Last data filed at 08/29/16 1100  Gross per 24 hour  Intake             1020 ml  Output               75 ml  Net              945 ml   Filed Weights   08/25/16 2312 08/26/16 0237  Weight: 112.4 kg (247 lb 12.8 oz) 110.5 kg (243 lb 9.7 oz)   Examination:  General exam: Appears calm and comfortable, NAD Respiratory system: Bilateral expiratory wheezing; Patient is not tachypenic or using any accessory muscles to breathe.  Cardiovascular system: S1 & S2 heard,  irregularly irregular with slightly tachycardic rate. No JVD, murmurs, rubs, gallops or clicks. No pedal edema. Gastrointestinal system: Abdomen is nondistended, soft and nontender. No organomegaly or masses felt. Normal bowel sounds heard x4. Colostomy in place.  Central nervous system: Alert and oriented. No focal deficits appreciated.  Extremities: No joint deformities.  Skin: No rashes, lesions or ulcers on limited skin evaluation.  Psychiatry: Mood & affect appropriate.  Data Reviewed: I have personally reviewed following labs and imaging studies  CBC:  Recent Labs Lab 08/25/16 2340 08/26/16 0558 08/27/16 1127 08/28/16 0954 08/29/16 0537  WBC  5.7 5.8 5.3 6.9 5.4  NEUTROABS 4.4 4.3 4.2 5.6 4.7  HGB 9.8* 10.0* 8.8* 9.2* 9.0*  HCT 34.4* 34.2* 31.2* 31.9* 30.9*  MCV 86.6 86.6 87.4 85.8 84.0  PLT 121* 109* 116* 119* 130*   Basic Metabolic Panel:  Recent Labs Lab 08/25/16 2340 08/26/16 0558 08/27/16 1127 08/28/16 0954 08/29/16 0537  NA 138 139 138 138 139  K 4.7 5.1 4.8 4.3 4.6  CL 94* 93* 95* 94* 93*  CO2 39* 38* 38* 38* 39*  GLUCOSE 148* 119* 123* 132* 142*  BUN '14 13 14 16 17  '$ CREATININE 0.64 0.67 0.61 0.52 0.56  CALCIUM 9.0 8.7* 8.7* 8.9 8.9  MG  --   --   --  1.8 1.8  PHOS  --   --   --  2.4* 2.7   GFR: Estimated Creatinine Clearance: 64.8 mL/min (by C-G formula based on SCr of 0.56 mg/dL). Liver Function Tests:  Recent Labs Lab 08/26/16 0558 08/28/16 0954 08/29/16 0537  AST 34 23 20  ALT '23 19 19  '$ ALKPHOS 58 68 64  BILITOT 0.4 0.6 0.3  PROT 6.6 6.4* 6.4*  ALBUMIN 3.1* 2.9* 2.8*   No results for input(s): LIPASE, AMYLASE in the last 168 hours.  Recent Labs Lab 08/27/16 1618  AMMONIA 44*   Coagulation Profile: No results for input(s): INR, PROTIME in the last 168 hours. Cardiac Enzymes:  Recent Labs Lab 08/25/16 2340 08/26/16 0558 08/26/16 1106 08/26/16 1518 08/27/16 1127  TROPONINI 0.15* 0.16* 0.16* 0.14* 0.15*   BNP (last 3  results) No results for input(s): PROBNP in the last 8760 hours. HbA1C: No results for input(s): HGBA1C in the last 72 hours. CBG: No results for input(s): GLUCAP in the last 168 hours. Lipid Profile: No results for input(s): CHOL, HDL, LDLCALC, TRIG, CHOLHDL, LDLDIRECT in the last 72 hours. Thyroid Function Tests: No results for input(s): TSH, T4TOTAL, FREET4, T3FREE, THYROIDAB in the last 72 hours. Anemia Panel: No results for input(s): VITAMINB12, FOLATE, FERRITIN, TIBC, IRON, RETICCTPCT in the last 72 hours. Sepsis Labs:  Recent Labs Lab 08/26/16 0146  LATICACIDVEN 0.91    Recent Results (from the past 240 hour(s))  Blood Culture (routine x 2)     Status: None (Preliminary result)   Collection Time: 08/26/16  1:40 AM  Result Value Ref Range Status   Specimen Description BLOOD LEFT ARM  Final   Special Requests BOTTLES DRAWN AEROBIC AND ANAEROBIC 5ML  Final   Culture   Final    NO GROWTH 2 DAYS Performed at [facility], [address], [place], [place] [address]    Report Status PENDING  Incomplete  Blood Culture (routine x 2)     Status: None (Preliminary result)   Collection Time: 08/26/16  1:45 AM  Result Value Ref Range Status   Specimen Description BLOOD RIGHT ARM  Final   Special Requests BOTTLES DRAWN AEROBIC AND ANAEROBIC 5ML  Final   Culture   Final    NO GROWTH 2 DAYS Performed at [facility], [place] [address] [place], [place] [address]    Report Status PENDING  Incomplete  MRSA PCR Screening     Status: None   Collection Time: 08/26/16  3:11 AM  Result Value Ref Range Status   MRSA by PCR NEGATIVE NEGATIVE Final    Comment:        The GeneXpert MRSA Assay (FDA approved for NASAL specimens only), is one component of a comprehensive MRSA colonization surveillance  program. It is not intended to diagnose MRSA infection nor to guide or monitor treatment for MRSA infections.   Respiratory Panel by PCR     Status: None   Collection Time:  08/26/16 10:00 AM  Result Value Ref Range Status   Adenovirus NOT DETECTED NOT DETECTED Final   Coronavirus 229E NOT DETECTED NOT DETECTED Final   Coronavirus HKU1 NOT DETECTED NOT DETECTED Final   Coronavirus NL63 NOT DETECTED NOT DETECTED Final   Coronavirus OC43 NOT DETECTED NOT DETECTED Final   Metapneumovirus NOT DETECTED NOT DETECTED Final   Rhinovirus / Enterovirus NOT DETECTED NOT DETECTED Final   Influenza A NOT DETECTED NOT DETECTED Final   Influenza B NOT DETECTED NOT DETECTED Final   Parainfluenza Virus 1 NOT DETECTED NOT DETECTED Final   Parainfluenza Virus 2 NOT DETECTED NOT DETECTED Final   Parainfluenza Virus 3 NOT DETECTED NOT DETECTED Final   Parainfluenza Virus 4 NOT DETECTED NOT DETECTED Final   Respiratory Syncytial Virus NOT DETECTED NOT DETECTED Final   Bordetella pertussis NOT DETECTED NOT DETECTED Final   Chlamydophila pneumoniae NOT DETECTED NOT DETECTED Final   Mycoplasma pneumoniae NOT DETECTED NOT DETECTED Final    Comment: Performed at [facility]  Culture, Urine     Status: Abnormal   Collection Time: 08/28/16  2:20 AM  Result Value Ref Range Status   Specimen Description URINE, CATHETERIZED  Final   Special Requests NONE  Final   Culture 20,000 COLONIES/mL YEAST (A)  Final   Report Status 08/29/2016 FINAL  Final  Culture, expectorated sputum-assessment     Status: None   Collection Time: 08/29/16 12:05 PM  Result Value Ref Range Status   Specimen Description SPUTUM  Final   Special Requests Immunocompromised  Final   Sputum evaluation THIS SPECIMEN IS ACCEPTABLE FOR SPUTUM CULTURE  Final   Report Status 08/29/2016 FINAL  Final   Radiology Studies: Ct Head W & Wo Contrast  Result Date: 08/27/2016 CLINICAL DATA:  Confusion.  History of lung cancer. EXAM: CT HEAD WITHOUT AND WITH CONTRAST TECHNIQUE: Contiguous axial images were obtained from the base of the skull through the vertex without and with intravenous contrast CONTRAST:  100 mL  Isovue 370 IV COMPARISON:  None. FINDINGS: Brain: Mild to moderate atrophy. Negative for hydrocephalus. Mild hypodensity in the periventricular white matter bilaterally. No acute infarct. Negative for hemorrhage or mass lesion. Normal enhancement following contrast infusion. No enhancing metastatic deposits in the brain. Vascular: Normal arterial and venous enhancement. Skull: Negative Sinuses/Orbits: Diffuse mucosal thickening sphenoid sinus. Remaining sinuses clear. Bilateral lens replacement. Other: None IMPRESSION: Atrophy and chronic microvascular ischemia.  No acute abnormality. Mucosal edema in the sphenoid sinus. Electronically Signed   By: Franchot Gallo M.D.   On: 08/27/2016 18:57   Ct Angio Chest Pe W Or Wo Contrast  Result Date: 08/27/2016 CLINICAL DATA:  Recently diagnosed anal cancer on radiation therapy, status post diverting colostomy. Now with shortness of breath, fever, chills and nausea. Chest x-ray earlier today shows opacity concerning for pneumonia. Additional history of previous partial right lung removal for mass/carcinoid. EXAM: CT ANGIOGRAPHY CHEST WITH CONTRAST TECHNIQUE: Multidetector CT imaging of the chest was performed using the standard protocol during bolus administration of intravenous contrast. Multiplanar CT image reconstructions and MIPs were obtained to evaluate the vascular anatomy. CONTRAST:  100 cc Isovue 370 COMPARISON:  None. FINDINGS: Cardiovascular: Some of the peripheral segmental and subsegmental pulmonary artery branches are difficult to definitively characterize due to patient breathing motion artifact  but there is no convincing pulmonary embolism identified within the main, lobar or central segmental pulmonary arteries. Thoracic aorta is normal in caliber, with scattered atherosclerosis. No aortic dissection. There is mild cardiomegaly.  No pericardial effusion. Mediastinum/Nodes: No mass or pathologically enlarged lymph nodes are identified within the  mediastinum or perihilar regions. Largest lymph nodes identified within the right lower paratracheal space and aortopulmonary window region are within normal limits at 8-9 mm short axis dimension. Esophagus appears normal. Trachea and central bronchi are unremarkable. Lungs/Pleura: Small right pleural effusion. Moderate-sized left pleural effusion. Postsurgical changes within the right perihilar region/right middle lobe compatible with given history of previous partial lobectomy for mass/carcinoid, with presumably associated atelectasis/airspace collapse extending towards the lower hilum. Additional patchy small nodular and ground-glass density consolidations are seen throughout the periphery of both lungs, densest component within the right lower lobe. Upper Abdomen: No acute findings. Musculoskeletal: Mild degenerative change throughout the thoracic spine. No acute or suspicious osseous finding. Review of the MIP images confirms the above findings. IMPRESSION: 1. Postsurgical changes within the right perihilar lung extending towards the right middle lobe, compatible with given history of previous partial lobectomy for mass/carcinoid, with presumably associated atelectasis/airspace collapse extending towards the lower right hilum. 2. Additional patchy nodular and ground-glass density consolidations throughout the periphery of both lungs, with densest component in the right lower lobe. Differential consideration includes both neoplastic and infectious etiologies. Favor pneumonia, perhaps atypical pneumonia such as fungal or viral. Recommend follow-up chest CT to ensure resolution, perhaps in 4-6 weeks. If there is not complete resolution, PET-CT may be needed for further characterization. 3. Moderate left pleural effusion and small right pleural effusion. 4. Cardiomegaly. 5. No pulmonary embolism seen, with mild study limitations detailed above. 6. Aortic atherosclerosis. Electronically Signed   By: Franki Cabot  M.D.   On: 08/27/2016 19:11   Scheduled Meds: . apixaban  5 mg Oral BID  . atorvastatin  20 mg Oral Daily  . aztreonam  2 g Intravenous Q8H  . budesonide (PULMICORT) nebulizer solution  0.25 mg Nebulization BID  . calcium-vitamin D  1 tablet Oral Daily  . cholecalciferol  1,000 Units Oral Daily  . doxycycline  100 mg Oral BID  . feeding supplement (ENSURE ENLIVE)  237 mL Oral TID BM  . fenofibrate  160 mg Oral Daily  . ferrous sulfate  325 mg Oral BID WC  . fluticasone  1 spray Each Nare Daily  . folic acid  308 mcg Oral Daily  . hydrocortisone  25 mg Rectal QHS  . Influenza vac split quadrivalent PF  0.5 mL Intramuscular Tomorrow-1000  . ipratropium-albuterol  3 mL Nebulization TID  . methylPREDNISolone (SOLU-MEDROL) injection  40 mg Intravenous Q6H  . metoprolol  100 mg Oral BID  . montelukast  10 mg Oral Daily  . multivitamin with minerals  1 tablet Oral Daily  . pantoprazole  40 mg Oral Daily  . PARoxetine  20 mg Oral QHS  . pyridOXINE  100 mg Oral Daily  . sodium chloride HYPERTONIC  4 mL Nebulization Daily  . vitamin B-12  1,000 mcg Oral Daily   Continuous Infusions:   LOS: 3 days   Kerney Elbe, DO [facility] Pager [phone]   If 7PM-7AM, please contact night-coverage [email or web address] Password [email or web address] 08/29/2016, 2:12 PM

## 2016-08-29 NOTE — Plan of Care (Signed)
Problem: Skin Integrity: Goal: Risk for impaired skin integrity will decrease Outcome: Progressing Patient has some MASD, will continue to check pt each hour and apply skin care products

## 2016-08-29 NOTE — Progress Notes (Signed)
[facility] Therapy Treatment Record Phone [phone]   Radiation Therapy was administered to Darliss Cheney on: 08/29/2016  10:39 AM and was treatment # 11 out of a planned course of 25 treatments.  Radiation Treatment  1). Beam photons with 6-10 energy and Photons 6-10 MeV  2). Brachytherapy None  3). Stereotactic Radiosurgery None  4). Other Radiation None     Katelynd Blauvelt G, RT (T)

## 2016-08-29 NOTE — Progress Notes (Signed)
HEMATOLOGY-ONCOLOGY PROGRESS NOTE  SUBJECTIVE:patient is admitted with the respiratory distress cough with expectoration and is found to have lung infiltrates on CT scan. I spoke with the patient's family. Patient was sleeping when I saw her and this was the first rest for sleep that she has had all day and therefore I did not wake her up. Family tells me that every day to have noticed improvement in her breathing cough and expectoration. He she is bringing up a lot of phlegm. She is receiving radiation therapy to the anal squamous cell carcinoma. Patient has prior lung carcinoid that was resected. She does have additional abnormalities that need to be evaluated with a PET scan.    Lung cancer ([place])    Anal cancer ([place])   07/25/2016 Initial Diagnosis    Perianal: Squamous cell cancer atleast 5 cm, Moderately differentiated T2N1 (lung nodules, inguinal LN unclear etiology) stage 3B vs Stage 4      08/13/2016 -  Radiation Therapy    Anal Radiation (diverting colostomy)        OBJECTIVE: REVIEW OF SYSTEMS:   Not obtained  PHYSICAL EXAMINATION: ECOG PERFORMANCE STATUS: 3 - Symptomatic, >50% confined to bed  Vitals:   08/28/16 1400 08/28/16 2156  BP: (!) 143/74 (!) 146/62  Pulse: 94 100  Resp:  (!) 22  Temp:  98.2 F (36.8 C)   Filed Weights   08/25/16 2312 08/26/16 0237  Weight: 247 lb 12.8 oz (112.4 kg) 243 lb 9.7 oz (110.5 kg)   Not done  LABORATORY DATA:  I have reviewed the data as listed CMP Latest Ref Rng & Units 08/29/2016 08/28/2016 08/27/2016  Glucose 65 - 99 mg/dL 142(H) 132(H) 123(H)  BUN 6 - 20 mg/dL '17 16 14  '$ Creatinine 0.44 - 1.00 mg/dL 0.56 0.52 0.61  Sodium 135 - 145 mmol/L 139 138 138  Potassium 3.5 - 5.1 mmol/L 4.6 4.3 4.8  Chloride 101 - 111 mmol/L 93(L) 94(L) 95(L)  CO2 22 - 32 mmol/L 39(H) 38(H) 38(H)  Calcium 8.9 - 10.3 mg/dL 8.9 8.9 8.7(L)  Total Protein 6.5 - 8.1 g/dL 6.4(L) 6.4(L) -  Total Bilirubin 0.3 - 1.2 mg/dL 0.3 0.6 -  Alkaline Phos 38 -  126 U/L 64 68 -  AST 15 - 41 U/L 20 23 -  ALT 14 - 54 U/L 19 19 -    Lab Results  Component Value Date   WBC 5.4 08/29/2016   HGB 9.0 (L) 08/29/2016   HCT 30.9 (L) 08/29/2016   MCV 84.0 08/29/2016   PLT 135 (L) 08/29/2016   NEUTROABS 4.7 08/29/2016    ASSESSMENT AND PLAN: 1. Bilateral patchy groundglass consolidations especially in the periphery of the lungs mostly in the right lower lobe. I suspect that these finding is suggestive of pneumonia rather than malignancy. I and requesting sputum analysis especially to look for fungal infections. Patient is currently on aztreonam and vancomycin.  2. Anal squamous cell carcinoma continue with radiation 3. Lung nodules: CT chest done on 07/25/2016 revealed multiple irregular nodules within the right upper lobe and right lower lobe. The current CT scan does comment on nodular densities but does not give the size of nature of the nodules. 4. Normocytic anemia: Due to current illness 5. Prognosis: Spoke to the family about her health situation. They're very positive about her recovery. I discussed with them that pneumonia at her age is a difficult challenge and that she is not completely out of the woods at this point.  We will  keep the PET/CT appointment but can certainly change it if she is not well enough to perform the scan.

## 2016-08-29 NOTE — Progress Notes (Signed)
Pharmacy Antibiotic Note  Tricia Hernandez is a 83 y.o. female  C/o cough, congestion and wheezing admitted on 08/25/2016 with pneumonia.  Pharmacy has been consulted for azactam dosing.  Plan:  Continue Azactam 2 Gm IV q8h  D/C vancomycin  Doxycycline '100mg'$  PO BID.  Follow up renal fxn, culture results, and clinical course.  Height: '5\' 4"'$  (162.6 cm) Weight: 243 lb 9.7 oz (110.5 kg) IBW/kg (Calculated) : 54.7  Temp (24hrs), Avg:98.1 F (36.7 C), Min:98 F (36.7 C), Max:98.2 F (36.8 C)   Recent Labs Lab 08/25/16 2340 08/26/16 0146 08/26/16 0558 08/27/16 1127 08/28/16 0954 08/29/16 0537  WBC 5.7  --  5.8 5.3 6.9 5.4  CREATININE 0.64  --  0.67 0.61 0.52 0.56  LATICACIDVEN  --  0.91  --   --   --   --     Estimated Creatinine Clearance: 64.8 mL/min (by C-G formula based on SCr of 0.56 mg/dL).    Allergies  Allergen Reactions  . Aleve [Naproxen Sodium] Hives  . Antihistamines, Chlorpheniramine-Type Other (See Comments)    Reaction:  Unknown   . Aspirin Other (See Comments)    Reaction:  Unknown   . Ciprofloxacin Other (See Comments)    Reaction:  Unknown   . Codeine Other (See Comments)    Reaction:  Unknown   . Penicillins Other (See Comments)    Reaction:  Unknown  Has patient had a PCN reaction causing immediate rash, facial/tongue/throat swelling, SOB or lightheadedness with hypotension: Unsure Has patient had a PCN reaction causing severe rash involving mucus membranes or skin necrosis: Unsure Has patient had a PCN reaction that required hospitalization Unsure Has patient had a PCN reaction occurring within the last 10 years: Unsure If all of the above answers are "NO", then may proceed with Cephalosporin use.   . Rocephin [Ceftriaxone Sodium In Dextrose] Other (See Comments)    Reaction:  Unknown   . Sulfa Antibiotics Other (See Comments)    Reaction:  Unknown   . Hydrochlorothiazide Rash    Antimicrobials this admission: 1/15 azactam >>  1/15  vancomycin >> 1/18 1/18 Doxycycline  Dose adjustments this admission:   Microbiology results: 1/15 BCx x2: NGTD 1/15 MRSA PCR: neg 1/15 influenza A/B: neg 1/17 UCx: 20k yeast 1/15 Resp PCR: negative  Thank you for allowing pharmacy to be a part of this patient's care.  Gretta Arab PharmD, BCPS Pager [phone] 08/29/2016 11:38 AM

## 2016-08-30 ENCOUNTER — Encounter: Payer: Self-pay | Admitting: Radiation Oncology

## 2016-08-30 ENCOUNTER — Ambulatory Visit
Admission: RE | Admit: 2016-08-30 | Discharge: 2016-08-30 | Disposition: A | Discharge status: Home / Self Care | Payer: Medicare Other | Source: Ambulatory Visit | Attending: Radiation Oncology | Admitting: Radiation Oncology

## 2016-08-30 ENCOUNTER — Encounter (HOSPITAL_COMMUNITY): Payer: Self-pay

## 2016-08-30 ENCOUNTER — Ambulatory Visit
Admit: 2016-08-30 | Discharge: 2016-08-30 | Disposition: A | Discharge status: Home / Self Care | Payer: Medicare Other | Attending: Radiation Oncology | Admitting: Radiation Oncology

## 2016-08-30 VITALS — BP 151/98 | HR 98 | Temp 98.4°F | Resp 20 | Ht 64.0 in | Wt 243.1 lb

## 2016-08-30 DIAGNOSIS — C210 Malignant neoplasm of anus, unspecified: Secondary | ICD-10-CM

## 2016-08-30 LAB — COMPREHENSIVE METABOLIC PANEL
ALBUMIN: 2.9 g/dL — AB (ref 3.5–5.0)
ALT: 22 U/L (ref 14–54)
ANION GAP: 6 (ref 5–15)
AST: 24 U/L (ref 15–41)
Alkaline Phosphatase: 69 U/L (ref 38–126)
BUN: 20 mg/dL (ref 6–20)
CO2: 41 mmol/L — AB (ref 22–32)
Calcium: 9 mg/dL (ref 8.9–10.3)
Chloride: 91 mmol/L — ABNORMAL LOW (ref 101–111)
Creatinine, Ser: 0.57 mg/dL (ref 0.44–1.00)
GFR calc non Af Amer: >60 mL/min (ref >60)
GLUCOSE: 134 mg/dL — AB (ref 65–99)
POTASSIUM: 4.3 mmol/L (ref 3.5–5.1)
SODIUM: 138 mmol/L (ref 135–145)
Total Bilirubin: 0.3 mg/dL (ref 0.3–1.2)
Total Protein: 6.8 g/dL (ref 6.5–8.1)

## 2016-08-30 LAB — CBC WITH DIFFERENTIAL/PLATELET
Basophils Absolute: 0 10*3/uL (ref 0.0–0.1)
Basophils Relative: 0 %
Eosinophils Absolute: 0 10*3/uL (ref 0.0–0.7)
Eosinophils Relative: 0 %
HCT: 33.6 % — ABNORMAL LOW (ref 36.0–46.0)
Hemoglobin: 10 g/dL — ABNORMAL LOW (ref 12.0–15.0)
Lymphocytes Relative: 4 %
Lymphs Abs: 0.4 10*3/uL — ABNORMAL LOW (ref 0.7–4.0)
MCH: 24.8 pg — ABNORMAL LOW (ref 26.0–34.0)
MCHC: 29.8 g/dL — ABNORMAL LOW (ref 30.0–36.0)
MCV: 83.4 fL (ref 78.0–100.0)
Monocytes Absolute: 0.4 10*3/uL (ref 0.1–1.0)
Monocytes Relative: 5 %
Neutro Abs: 8.2 10*3/uL — ABNORMAL HIGH (ref 1.7–7.7)
Neutrophils Relative %: 91 %
Platelets: 173 10*3/uL (ref 150–400)
RBC: 4.03 MIL/uL (ref 3.87–5.11)
RDW: 15.8 % — ABNORMAL HIGH (ref 11.5–15.5)
WBC: 8.9 10*3/uL (ref 4.0–10.5)

## 2016-08-30 LAB — MAGNESIUM: Magnesium: 1.8 mg/dL (ref 1.7–2.4)

## 2016-08-30 LAB — PHOSPHORUS: Phosphorus: 2.6 mg/dL (ref 2.5–4.6)

## 2016-08-30 MED ORDER — METHYLPREDNISOLONE SODIUM SUCC 40 MG IJ SOLR
40.0000 mg | Freq: Two times a day (BID) | INTRAMUSCULAR | Status: DC
  Administered 2016-08-30 – 2016-09-01 (×3): 40 mg via INTRAVENOUS
  Filled 2016-08-30 (×3): qty 1

## 2016-08-30 NOTE — Progress Notes (Signed)
  Radiation Oncology         [phone]   Name: Tricia Hernandez MRN: [id number]   Date: 08/30/2016  DOB: Jul 07, 1931   Weekly Radiation Therapy Management    ICD-9-CM ICD-10-CM   1. Anal cancer ([place]) 154.3 C21.0     Current Dose: 21.6 Gy  Planned Dose:  45 Gy  Narrative The patient presents for routine under treatment assessment.  Weight stable. Denies pain. Hyperpigmentation below colostomy bag noted without desquamation by the nurse. The patient denies a skin reaction from treatment at this time. Reports many issues the with colostomy bag resolved on Tuesday when they changed her colostomy ring to a wax ring and gave her a colostomy belt. Dark brown loose stool noted in colostomy bag without blood. Air/gas noted in colostomy bag. Reports a productive cough since Tuesday with clear to yellow sputum. Reports black stool with blood seeps from rectum. Continues oxygen therapy two liters via nasal cannula. Reports occasional dysuria. Patient is taking an iron supplement. Reports rectal spasms are better. To start PT over at [facility].  Set-up films were reviewed. The chart was checked.  Physical Findings  height is '5\' 4"'$  (1.626 m) and weight is 243 lb 1.6 oz (110.3 kg). Her oral temperature is 98.4 F (36.9 C). Her blood pressure is 151/98 (abnormal) and her pulse is 98. Her respiration is 20 and oxygen saturation is 98%.  Weight essentially stable.  No significant changes. In wheelchair. Oxygen via nasal cannula.   Impression The patient is tolerating radiation. I have encouraged the patient to increase fluid intake and rest over the weekend to hopefully help with current sputum production.  Plan Continue treatment as planned.      Sheral Apley [name], M.D.  This document serves as a record of services personally performed by Tyler Pita, MD. It was created on his behalf by Darcus Austin, a trained medical scribe. The creation of this record is based on the scribe's personal observations  and the provider's statements to them. This document has been checked and approved by the attending provider.

## 2016-08-30 NOTE — Care Management Note (Signed)
Case Management Note  Patient Details  Name: Tricia Hernandez MRN: [id number] Date of Birth: 12/20/1930  Subjective/Objective:  Pt admitted with Acute respiratory failure with hypoxia from [facility].                    Action/Plan:  Plan to return to SNF   Expected Discharge Date:    08/31/2016              Expected Discharge Plan:  [place]  In-House Referral:  Clinical Social Work  Discharge planning Services  CM Consult  Post Acute Care Choice:  NA Choice offered to:  Adult Children  DME Arranged:    DME Agency:     HH Arranged:    HH Agency:     Status of Service:  In process, will continue to follow  If discussed at Long Length of Stay Meetings, dates discussed:    Additional CommentsPurcell Mouton, RN 08/30/2016, 4:38 PM

## 2016-08-30 NOTE — Progress Notes (Signed)
PROGRESS NOTE    Tricia Hernandez  NGE:[id number] DOB: 1931-03-22 DOA: 08/25/2016 PCP: Clinton Quant, MD   Brief Narrative:   83 y.o. female with recently diagnosed anal cancer on radiation therapy and has had recent diverting colostomy was brought to the ER after patient was having persistent shortness of breath with subjective feeling of fever and chills and nausea. Patient admitted and continued on IV antibiotics. She required more than her baseline oxygen (on 3L at home) and required 4-5L at present time.  Family agreed patient seemed slightly confused on 08/27/16 and as such labs were ordered and CT of the head ordered as well as CT of the chest (this was done at Dr. Geralyn Flash request). Patient found to have a Pneumonia and being treated with Steroids and Abx. Patient is slowly improving and is to go down again for Radiation treatment again.    Assessment & Plan:   Principal Problem:   Acute respiratory failure with hypoxia (HCC) Active Problems:   Pulmonary embolus (HCC)   Anal cancer (HCC)   Elevated troponin   Atrial fibrillation (HCC), CHA2DS2-VASc Score 5   Essential hypertension   HCAP (healthcare-associated pneumonia)  Acute respiratory failure with hypoxia likely from PNA; improving - Was 84% on 2 Liters on Admission. Improved to 96% on 3 Liters yesterday; Currently maintaining on 3 Liters - C/w Aztreonam and Doxycycline for Atypical Coverage; Multiple Drug Allergies; Will discuss with Pharmacy on what to change to po - Influenza PCR Negative - Follow blood cultures; Showed NGTD x 4 days - Respiratory Panel Negative - Continue Nebs with Budesonide and scheduled and prn DuoNeb - CTA was obtained and showed bilateral patchy nodular ground glass densities/consolidations in Right > Left Lung; Has known in past nodular densities in past. Carcinoid vs. Metastasis per notes - C/w Solumedrol 40 q12h tomorrow as patient was still significantly wheezing  - Continue with Flutter  Valve - Will Send for Fungal Respiratory Cx per Oncology request - Continue to Monitor closlety  Elevated Troponins - elevated troponin likely from respiratory distress and hypoxia, atrial fibrillation - troponin appear Flat - Unlikely ACS at this point. Will continue to Monitor and low threshold for Cardiac Eval  Altered Mental Status - Much Improved. - CT head showed atrophy and chronic microvascular ischemia with no acute abnormality and mucosal edema in the sphenoid sinus - Ammonia level 44 - U/A not specific and Urine Cx showed 20,000 CFU of Yeast  Atrial Fibrillation  - chads 2 vasc score of 5.  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 Lindi Adie who will see patient in consultation and additional recc's - CT Chest as above  DVT prophylaxis: Apixaban. Code Status: Full code.  Family Communication: No Family present at bedside Disposition Plan: Skilled nursing facility when Stable for D/C;   Consultants:   Oncology Dr. Lindi Adie  Procedures:   None  Antimicrobials:  Anti-infectives    Start     Dose/Rate Route  Frequency Ordered Stop   08/29/16 1400  doxycycline (VIBRA-TABS) tablet 100 mg     100 mg Oral 2 times daily 08/29/16 1334     08/29/16 1200  doxycycline (VIBRA-TABS) tablet 100 mg  Status:  Discontinued     100 mg Oral 2 times daily 08/29/16 1031 08/29/16 1333   08/27/16 0600  vancomycin (VANCOCIN)  1,250 mg in sodium chloride 0.9 % 250 mL IVPB  Status:  Discontinued     1,250 mg 166.7 mL/hr over 90 Minutes Intravenous Every 24 hours 08/26/16 0328 08/29/16 1031   08/26/16 1000  aztreonam (AZACTAM) 2 GM IVPB  Status:  Discontinued     2 g 100 mL/hr over 30 Minutes Intravenous Every 8 hours 08/26/16 0328 08/26/16 0938   08/26/16 0200  aztreonam (AZACTAM) 2 GM IVPB     2 g 100 mL/hr over 30 Minutes Intravenous Every 8 hours 08/26/16 0147     08/26/16 0130  vancomycin (VANCOCIN) 2,000 mg in sodium chloride 0.9 % 500 mL IVPB     2,000 mg 250 mL/hr over 120 Minutes Intravenous  Once 08/26/16 0122 08/26/16 0505   08/26/16 0115  aztreonam (AZACTAM) 2 g in dextrose 5 % 50 mL IVPB  Status:  Discontinued     2 g 100 mL/hr over 30 Minutes Intravenous  Once 08/26/16 0107 08/26/16 0142   08/26/16 0115  vancomycin (VANCOCIN) IVPB 1000 mg/200 mL premix  Status:  Discontinued     1,000 mg 200 mL/hr over 60 Minutes Intravenous  Once 08/26/16 0107 08/26/16 0122      Subjective: Doing well today. Mentation is much improved. States she is coughing up yellow sputum still. No other concerns or complaints at this time.  Objective: Vitals:   08/30/16 0942 08/30/16 1006 08/30/16 1011 08/30/16 1258  BP: (!) 165/62   (!) 145/65  Pulse: 100   89  Resp:    18  Temp:    98.2 F (36.8 C)  TempSrc:    Oral  SpO2:  97% 97% 98%  Weight:      Height:        Intake/Output Summary (Last 24 hours) at 08/30/16 1640 Last data filed at 08/30/16 0600  Gross per 24 hour  Intake              150 ml  Output               85 ml  Net               65 ml   Filed Weights   08/25/16 2312 08/26/16 0237  Weight: 112.4 kg (247 lb 12.8 oz) 110.5 kg (243 lb 9.7 oz)   Examination:  General exam: Appears calm and comfortable, NAD Respiratory system: Bilateral diminished breath sounds with some expiratory wheezing and mild rhochi; Patient is not tachypenic or using any accessory muscles to breathe.  Cardiovascular  system: S1 & S2 heard, irregularly irregular with slightly tachycardic rate. No JVD, murmurs, rubs, gallops or clicks. No pedal edema. Gastrointestinal system: Abdomen is nondistended, soft and nontender. No organomegaly or masses felt. Normal bowel sounds heard x4. Colostomy in place.  Central nervous system: Alert and oriented. No focal deficits appreciated.  Extremities: No joint deformities.  Skin: No rashes, lesions or ulcers on limited skin evaluation.  Psychiatry: Mood & affect appropriate.  Data Reviewed: I have personally reviewed following labs and imaging studies  CBC:  Recent Labs Lab 08/26/16 0558 08/27/16 1127 08/28/16 5852 08/29/16 0537 08/30/16 7782  WBC 5.8 5.3 6.9 5.4 8.9  NEUTROABS 4.3 4.2 5.6 4.7 8.2*  HGB 10.0* 8.8* 9.2* 9.0* 10.0*  HCT 34.2* 31.2* 31.9* 30.9* 33.6*  MCV 86.6 87.4 85.8 84.0 83.4  PLT 109* 116* 119* 135* 096   Basic Metabolic Panel:  Recent Labs Lab 08/26/16 0558 08/27/16 1127 08/28/16 0954 08/29/16 0537 08/30/16 0844  NA 139 138 138 139 138  K 5.1 4.8 4.3 4.6 4.3  CL 93* 95* 94* 93* 91*  CO2 38* 38* 38* 39* 41*  GLUCOSE 119* 123* 132* 142* 134*  BUN '13 14 16 17 20  '$ CREATININE 0.67 0.61 0.52 0.56 0.57  CALCIUM 8.7* 8.7* 8.9 8.9 9.0  MG  --   --  1.8 1.8 1.8  PHOS  --   --  2.4* 2.7 2.6   GFR: Estimated Creatinine Clearance: 64.8 mL/min (by C-G formula based on SCr of 0.57 mg/dL). Liver Function Tests:  Recent Labs Lab 08/26/16 0558 08/28/16 0954 08/29/16 0537 08/30/16 0844  AST 34 '23 20 24  '$ ALT '23 19 19 22  '$ ALKPHOS 58 68 64 69  BILITOT 0.4 0.6 0.3 0.3  PROT 6.6 6.4* 6.4* 6.8  ALBUMIN 3.1* 2.9* 2.8* 2.9*   No results for input(s): LIPASE, AMYLASE in the last 168 hours.  Recent Labs Lab 08/27/16 1618  AMMONIA 44*   Coagulation Profile: No results for input(s): INR, PROTIME in the last 168 hours. Cardiac Enzymes:  Recent Labs Lab 08/25/16 2340 08/26/16 0558 08/26/16 1106 08/26/16 1518 08/27/16 1127   TROPONINI 0.15* 0.16* 0.16* 0.14* 0.15*   BNP (last 3 results) No results for input(s): PROBNP in the last 8760 hours. HbA1C: No results for input(s): HGBA1C in the last 72 hours. CBG: No results for input(s): GLUCAP in the last 168 hours. Lipid Profile: No results for input(s): CHOL, HDL, LDLCALC, TRIG, CHOLHDL, LDLDIRECT in the last 72 hours. Thyroid Function Tests: No results for input(s): TSH, T4TOTAL, FREET4, T3FREE, THYROIDAB in the last 72 hours. Anemia Panel: No results for input(s): VITAMINB12, FOLATE, FERRITIN, TIBC, IRON, RETICCTPCT in the last 72 hours. Sepsis Labs:  Recent Labs Lab 08/26/16 0146  LATICACIDVEN 0.91    Recent Results (from the past 240 hour(s))  Blood Culture (routine x 2)     Status: None (Preliminary result)   Collection Time: 08/26/16  1:40 AM  Result Value Ref Range Status   Specimen Description BLOOD LEFT ARM  Final   Special Requests BOTTLES DRAWN AEROBIC AND ANAEROBIC 5ML  Final   Culture   Final    NO GROWTH 4 DAYS Performed at [facility], [place] [address], [place], [place] [address]    Report Status PENDING  Incomplete  Blood Culture (routine x 2)     Status: None (Preliminary result)   Collection Time: 08/26/16  1:45 AM  Result Value Ref Range Status   Specimen Description BLOOD RIGHT ARM  Final   Special Requests BOTTLES DRAWN AEROBIC AND ANAEROBIC 5ML  Final   Culture   Final    NO GROWTH 4 DAYS Performed at [facility], [place] [address] [place], [place] [address]    Report Status PENDING  Incomplete  MRSA PCR Screening     Status: None   Collection Time: 08/26/16  3:11 AM  Result Value Ref Range Status   MRSA by PCR NEGATIVE NEGATIVE Final    Comment:        The GeneXpert MRSA Assay (FDA approved for NASAL specimens only), is one component of  a comprehensive MRSA colonization surveillance program. It is not intended to diagnose MRSA infection nor to guide or monitor treatment for MRSA infections.    Respiratory Panel by PCR     Status: None   Collection Time: 08/26/16 10:00 AM  Result Value Ref Range Status   Adenovirus NOT DETECTED NOT DETECTED Final   Coronavirus 229E NOT DETECTED NOT DETECTED Final   Coronavirus HKU1 NOT DETECTED NOT DETECTED Final   Coronavirus NL63 NOT DETECTED NOT DETECTED Final   Coronavirus OC43 NOT DETECTED NOT DETECTED Final   Metapneumovirus NOT DETECTED NOT DETECTED Final   Rhinovirus / Enterovirus NOT DETECTED NOT DETECTED Final   Influenza A NOT DETECTED NOT DETECTED Final   Influenza B NOT DETECTED NOT DETECTED Final   Parainfluenza Virus 1 NOT DETECTED NOT DETECTED Final   Parainfluenza Virus 2 NOT DETECTED NOT DETECTED Final   Parainfluenza Virus 3 NOT DETECTED NOT DETECTED Final   Parainfluenza Virus 4 NOT DETECTED NOT DETECTED Final   Respiratory Syncytial Virus NOT DETECTED NOT DETECTED Final   Bordetella pertussis NOT DETECTED NOT DETECTED Final   Chlamydophila pneumoniae NOT DETECTED NOT DETECTED Final   Mycoplasma pneumoniae NOT DETECTED NOT DETECTED Final    Comment: Performed at [facility]  Culture, Urine     Status: Abnormal   Collection Time: 08/28/16  2:20 AM  Result Value Ref Range Status   Specimen Description URINE, CATHETERIZED  Final   Special Requests NONE  Final   Culture 20,000 COLONIES/mL YEAST (A)  Final   Report Status 08/29/2016 FINAL  Final  Culture, expectorated sputum-assessment     Status: None   Collection Time: 08/29/16 12:05 PM  Result Value Ref Range Status   Specimen Description SPUTUM  Final   Special Requests Immunocompromised  Final   Sputum evaluation THIS SPECIMEN IS ACCEPTABLE FOR SPUTUM CULTURE  Final   Report Status 08/29/2016 FINAL  Final  Culture, respiratory (NON-Expectorated)     Status: None (Preliminary result)   Collection Time: 08/29/16 12:05 PM  Result Value Ref Range Status   Specimen Description SPUTUM  Final   Special Requests Immunocompromised Reflexed from [id number]  Final    Gram Stain   Final    FEW WBC PRESENT, PREDOMINANTLY MONONUCLEAR MODERATE GRAM POSITIVE COCCI IN PAIRS RARE GRAM POSITIVE RODS RARE GRAM NEGATIVE COCCI IN PAIRS    Culture   Final    CULTURE REINCUBATED FOR BETTER GROWTH Performed at [facility], [place]. [address] [place], [place] [address]    Report Status PENDING  Incomplete   Radiology Studies: No results found. Scheduled Meds: . apixaban  5 mg Oral BID  . atorvastatin  20 mg Oral Daily  . aztreonam  2 g Intravenous Q8H  . budesonide (PULMICORT) nebulizer solution  0.25 mg Nebulization BID  . calcium-vitamin D  1 tablet Oral Daily  . cholecalciferol  1,000 Units Oral Daily  . doxycycline  100 mg Oral BID  . feeding supplement (ENSURE ENLIVE)  237 mL Oral TID BM  . fenofibrate  160 mg Oral Daily  . ferrous sulfate  325 mg Oral BID WC  . fluticasone  1 spray Each Nare Daily  . folic acid  962 mcg Oral Daily  . hydrocortisone  25 mg Rectal QHS  . Influenza vac split quadrivalent PF  0.5 mL Intramuscular Tomorrow-1000  . ipratropium-albuterol  3 mL Nebulization TID  . methylPREDNISolone (SOLU-MEDROL) injection  40 mg Intravenous Q6H  . metoprolol  100 mg Oral BID  .  montelukast  10 mg Oral Daily  . multivitamin with minerals  1 tablet Oral Daily  . pantoprazole  40 mg Oral Daily  . PARoxetine  20 mg Oral QHS  . pyridOXINE  100 mg Oral Daily  . vitamin B-12  1,000 mcg Oral Daily   Continuous Infusions:   LOS: 4 days   Kerney Elbe, DO [facility] Pager [phone]   If 7PM-7AM, please contact night-coverage [email or web address] Password [email or web address] 08/30/2016, 4:40 PM

## 2016-08-30 NOTE — Progress Notes (Signed)
Weight stable. Denies pain. Hyperpigmentation below colostomy bag noted without desquamation. Reports many issue with colostomy bag resolved on Tuesday when they changed her colostomy ring to a wax ring and gave her a colostomy belt. Dark brown loose stool noted in colostomy bag without blood. Air/gas noted in colostomy bag. Reports a productive cough since Tuesday with clear to yellow sputum. Reports black stool with blood seeps from rectum. Continues oxygen therapy two liters via nasal cannula noted. Reports occasional dysuria. Patient is taking an iron supplement. Reports rectal spasms are better .  To start PT over at [facility]. Wt Readings from Last 3 Encounters:  08/26/16 243 lb 9.7 oz (110.5 kg)  08/30/16 243 lb 1.6 oz (110.3 kg)  08/23/16 247 lb 12.8 oz (112.4 kg)  BP (!) 151/98   Pulse 98   Temp 98.4 F (36.9 C) (Oral)   Resp 20   Ht '5\' 4"'$  (1.626 m)   Wt 243 lb 1.6 oz (110.3 kg)   SpO2 98%   BMI 41.73 kg/m

## 2016-08-30 NOTE — Care Management Important Message (Deleted)
Important Message  Patient Details  Name: Tricia Hernandez MRN: [id number] Date of Birth: 1931-04-14   Medicare Important Message Given:  Yes    Kerin Salen 08/30/2016, 2:16 PM

## 2016-08-30 NOTE — Care Management Important Message (Deleted)
Important Message  Patient Details  Name: Tricia Hernandez MRN: [id number] Date of Birth: [birth year]   Medicare Important Message Given:  Yes    Kerin Salen 08/30/2016, 2:18 [place] Message  Patient Details  Name: Tricia Hernandez MRN: [id number] Date of Birth: 04/12/1931   Medicare Important Message Given:  Yes    Kerin Salen 08/30/2016, 2:17 PM

## 2016-08-30 NOTE — Care Management Important Message (Signed)
Important Message  Patient Details  Name: Tricia Hernandez MRN: [id number] Date of Birth: 1931/05/29   Medicare Important Message Given:  Yes    Kerin Salen 08/30/2016, 2:15 [place] Message  Patient Details  Name: Tricia Hernandez MRN: [id number] Date of Birth: Jul 17, 1931   Medicare Important Message Given:  Yes    Kerin Salen 08/30/2016, 2:15 PM

## 2016-08-31 LAB — CULTURE, RESPIRATORY

## 2016-08-31 LAB — CULTURE, BLOOD (ROUTINE X 2)
CULTURE: NO GROWTH
Culture: NO GROWTH

## 2016-08-31 LAB — CULTURE, RESPIRATORY W GRAM STAIN: Culture: NORMAL

## 2016-08-31 NOTE — Clinical Social Work Note (Signed)
Patient can return to [facility] once medically stable for discharge.   MSW remains available as needed.   Glendon Axe, MSW [phone] 08/31/2016 11:16 AM

## 2016-08-31 NOTE — Progress Notes (Signed)
PROGRESS NOTE    Tricia Hernandez  JOI:[id number] DOB: 1931/05/28 DOA: 08/25/2016 PCP: Clinton Quant, MD   Brief Narrative:   83 y.o. female with recently diagnosed anal cancer on radiation therapy and has had recent diverting colostomy was brought to the ER after patient was having persistent shortness of breath with subjective feeling of fever and chills and nausea. Patient admitted and continued on IV antibiotics. She required more than her baseline oxygen (on 3L at home) and required 4-5L at present time.  Family agreed patient seemed slightly confused on 08/27/16 and as such labs were ordered and CT of the head ordered as well as CT of the chest (this was done at Dr. Geralyn Flash request). Patient found to have a Pneumonia and being treated with Steroids and Abx. Patient is slowly improving. Assessment & Plan:   Principal Problem:   Acute respiratory failure with hypoxia (HCC) Active Problems:   Pulmonary embolus (HCC)   Anal cancer (HCC)   Elevated troponin   Atrial fibrillation (HCC), CHA2DS2-VASc Score 5   Essential hypertension   HCAP (healthcare-associated pneumonia)  Acute respiratory failure with hypoxia likely from PNA; improving - Was 84% on 2 Liters on Admission. Improved to 96% on 3 Liters yesterday; Currently maintaining on 2 Liters - C/w Aztreonam and Doxycycline for Atypical Coverage; Multiple Drug Allergies; Will discuss with Pharmacy on what to change to po - Influenza PCR Negative - Follow blood cultures; Showed NGTD x 5days - Respiratory Panel Negative - Continue Nebs with Budesonide and scheduled and prn DuoNeb - CTA was obtained and showed bilateral patchy nodular ground glass densities/consolidations in Right > Left Lung; Has known in past nodular densities in past. Carcinoid vs. Metastasis per notes - C/w Solumedrol 40 q12h as patient was still significantly wheezing  - Continue with Flutter Valve - Respiratory Cx consistent with Normal Respiratory Flora -  Continue to Monitor closlety  Elevated Troponins - elevated troponin likely from respiratory distress and hypoxia, atrial fibrillation - troponin appear Flat - Unlikely ACS at this point. Will continue to Monitor and low threshold for Cardiac Eval  Altered Mental Status - Much Improved. - CT head showed atrophy and chronic microvascular ischemia with no acute abnormality and mucosal edema in the sphenoid sinus - Ammonia level 44 - U/A not specific and Urine Cx showed 20,000 CFU of Yeast  Atrial Fibrillation  - chads 2 vasc score of 5.  - on Apixaban and metoprolol 100 mg po BID  History of Pulmonary Embolism - Currently on Apixaban  Chronic Anemia and Thrombocytopenia, improving - Likely 2/2 to illness - follow CBC   Recently diagnosed anal squamous cell carcinoma - Has diverting colostomy - On radiation therapy; Went for Treatment #12 out of 25 Planned Course Treatments yesterday  Lung nodules with history of carcinoid which was resected - Oncologist is planning to have PET scan done next week to see if patient has metastasis or known carcinoid next week; May need to Reschedule if still in the Hospital - Appreciate Dr. Lindi Adie who will see patient in consultation and additional recc's - CT Chest as above  DVT prophylaxis: Apixaban. Code Status: Full code.  Family Communication: No Family present at bedside Disposition Plan: Skilled nursing facility when Stable for D/C;   Consultants:   Oncology Dr. Lindi Adie  Procedures:   None  Antimicrobials:  Anti-infectives    Start     Dose/Rate Route Frequency Ordered Stop   08/29/16 1400  doxycycline (VIBRA-TABS) tablet 100 mg  100 mg Oral 2 times daily 08/29/16 1334     08/29/16 1200  doxycycline (VIBRA-TABS) tablet 100 mg  Status:  Discontinued     100 mg Oral 2 times daily 08/29/16 1031 08/29/16 1333   08/27/16 0600  vancomycin (VANCOCIN) 1,250 mg in sodium chloride 0.9 % 250 mL IVPB  Status:  Discontinued     1,250  mg 166.7 mL/hr over 90 Minutes Intravenous Every 24 hours 08/26/16 0328 08/29/16 1031   08/26/16 1000  aztreonam (AZACTAM) 2 GM IVPB  Status:  Discontinued     2 g 100 mL/hr over 30 Minutes Intravenous Every 8 hours 08/26/16 0328 08/26/16 0938   08/26/16 0200  aztreonam (AZACTAM) 2 GM IVPB     2 g 100 mL/hr over 30 Minutes Intravenous Every 8 hours 08/26/16 0147     08/26/16 0130  vancomycin (VANCOCIN) 2,000 mg in sodium chloride 0.9 % 500 mL IVPB     2,000 mg 250 mL/hr over 120 Minutes Intravenous  Once 08/26/16 0122 08/26/16 0505   08/26/16 0115  aztreonam (AZACTAM) 2 g in dextrose 5 % 50 mL IVPB  Status:  Discontinued     2 g 100 mL/hr over 30 Minutes Intravenous  Once 08/26/16 0107 08/26/16 0142   08/26/16 0115  vancomycin (VANCOCIN) IVPB 1000 mg/200 mL premix  Status:  Discontinued     1,000 mg 200 mL/hr over 60 Minutes Intravenous  Once 08/26/16 0107 08/26/16 0122      Subjective: Doing well today. No active complaints and is tolerating food well.   Objective: Vitals:   08/31/16 1325 08/31/16 1352 08/31/16 1945 08/31/16 1948  BP:  131/70    Pulse: 95 94    Resp: 16 16    Temp:  97.9 F (36.6 C)    TempSrc:  Oral    SpO2: 98% 96% 99% 99%  Weight:      Height:        Intake/Output Summary (Last 24 hours) at 08/31/16 2002 Last data filed at 08/31/16 1352  Gross per 24 hour  Intake              340 ml  Output              675 ml  Net             -335 ml   Filed Weights   08/25/16 2312 08/26/16 0237  Weight: 112.4 kg (247 lb 12.8 oz) 110.5 kg (243 lb 9.7 oz)   Examination:  General exam: Appears calm and comfortable, NAD Respiratory system: Bilateral diminished breath sounds with some expiratory wheezing still and mild rhochi; Patient is not tachypenic or using any accessory muscles to breathe.  Cardiovascular system: S1 & S2 heard, irregularly irregular with slightly tachycardic rate. No JVD, murmurs, rubs, gallops or clicks. No pedal edema. Gastrointestinal  system: Abdomen is nondistended, soft and nontender. No organomegaly or masses felt. Normal bowel sounds heard x4. Colostomy in place with Air and Stool.  Central nervous system: Alert and oriented. No focal deficits appreciated.  Extremities: No joint deformities.  Skin: No rashes, lesions or ulcers on limited skin evaluation.  Psychiatry: Mood & affect appropriate.  Data Reviewed: I have personally reviewed following labs and imaging studies  CBC:  Recent Labs Lab 08/26/16 0558 08/27/16 1127 08/28/16 0954 08/29/16 0537 08/30/16 0844  WBC 5.8 5.3 6.9 5.4 8.9  NEUTROABS 4.3 4.2 5.6 4.7 8.2*  HGB 10.0* 8.8* 9.2* 9.0* 10.0*  HCT 34.2* 31.2* 31.9* 30.9* 33.6*  MCV 86.6 87.4 85.8 84.0 83.4  PLT 109* 116* 119* 135* 704   Basic Metabolic Panel:  Recent Labs Lab 08/26/16 0558 08/27/16 1127 08/28/16 0954 08/29/16 0537 08/30/16 0844  NA 139 138 138 139 138  K 5.1 4.8 4.3 4.6 4.3  CL 93* 95* 94* 93* 91*  CO2 38* 38* 38* 39* 41*  GLUCOSE 119* 123* 132* 142* 134*  BUN '13 14 16 17 20  '$ CREATININE 0.67 0.61 0.52 0.56 0.57  CALCIUM 8.7* 8.7* 8.9 8.9 9.0  MG  --   --  1.8 1.8 1.8  PHOS  --   --  2.4* 2.7 2.6   GFR: Estimated Creatinine Clearance: 64.8 mL/min (by C-G formula based on SCr of 0.57 mg/dL). Liver Function Tests:  Recent Labs Lab 08/26/16 0558 08/28/16 0954 08/29/16 0537 08/30/16 0844  AST 34 '23 20 24  '$ ALT '23 19 19 22  '$ ALKPHOS 58 68 64 69  BILITOT 0.4 0.6 0.3 0.3  PROT 6.6 6.4* 6.4* 6.8  ALBUMIN 3.1* 2.9* 2.8* 2.9*   No results for input(s): LIPASE, AMYLASE in the last 168 hours.  Recent Labs Lab 08/27/16 1618  AMMONIA 44*   Coagulation Profile: No results for input(s): INR, PROTIME in the last 168 hours. Cardiac Enzymes:  Recent Labs Lab 08/25/16 2340 08/26/16 0558 08/26/16 1106 08/26/16 1518 08/27/16 1127  TROPONINI 0.15* 0.16* 0.16* 0.14* 0.15*   BNP (last 3 results) No results for input(s): PROBNP in the last 8760 hours. HbA1C: No  results for input(s): HGBA1C in the last 72 hours. CBG: No results for input(s): GLUCAP in the last 168 hours. Lipid Profile: No results for input(s): CHOL, HDL, LDLCALC, TRIG, CHOLHDL, LDLDIRECT in the last 72 hours. Thyroid Function Tests: No results for input(s): TSH, T4TOTAL, FREET4, T3FREE, THYROIDAB in the last 72 hours. Anemia Panel: No results for input(s): VITAMINB12, FOLATE, FERRITIN, TIBC, IRON, RETICCTPCT in the last 72 hours. Sepsis Labs:  Recent Labs Lab 08/26/16 0146  LATICACIDVEN 0.91    Recent Results (from the past 240 hour(s))  Blood Culture (routine x 2)     Status: None   Collection Time: 08/26/16  1:40 AM  Result Value Ref Range Status   Specimen Description BLOOD LEFT ARM  Final   Special Requests BOTTLES DRAWN AEROBIC AND ANAEROBIC 5ML  Final   Culture   Final    NO GROWTH 5 DAYS Performed at [facility], [place] [address], [place], [place] [address]    Report Status 08/31/2016 FINAL  Final  Blood Culture (routine x 2)     Status: None   Collection Time: 08/26/16  1:45 AM  Result Value Ref Range Status   Specimen Description BLOOD RIGHT ARM  Final   Special Requests BOTTLES DRAWN AEROBIC AND ANAEROBIC 5ML  Final   Culture   Final    NO GROWTH 5 DAYS Performed at [facility], [place] [address] [place], [place] [address]    Report Status 08/31/2016 FINAL  Final  MRSA PCR Screening     Status: None   Collection Time: 08/26/16  3:11 AM  Result Value Ref Range Status   MRSA by PCR NEGATIVE NEGATIVE Final    Comment:        The GeneXpert MRSA Assay (FDA approved for NASAL specimens only), is one component of a comprehensive MRSA colonization surveillance program. It is not intended to diagnose MRSA infection nor to guide or monitor treatment for MRSA infections.   Respiratory Panel by PCR  Status: None   Collection Time: 08/26/16 10:00 AM  Result Value Ref Range Status   Adenovirus NOT DETECTED NOT DETECTED Final   Coronavirus  229E NOT DETECTED NOT DETECTED Final   Coronavirus HKU1 NOT DETECTED NOT DETECTED Final   Coronavirus NL63 NOT DETECTED NOT DETECTED Final   Coronavirus OC43 NOT DETECTED NOT DETECTED Final   Metapneumovirus NOT DETECTED NOT DETECTED Final   Rhinovirus / Enterovirus NOT DETECTED NOT DETECTED Final   Influenza A NOT DETECTED NOT DETECTED Final   Influenza B NOT DETECTED NOT DETECTED Final   Parainfluenza Virus 1 NOT DETECTED NOT DETECTED Final   Parainfluenza Virus 2 NOT DETECTED NOT DETECTED Final   Parainfluenza Virus 3 NOT DETECTED NOT DETECTED Final   Parainfluenza Virus 4 NOT DETECTED NOT DETECTED Final   Respiratory Syncytial Virus NOT DETECTED NOT DETECTED Final   Bordetella pertussis NOT DETECTED NOT DETECTED Final   Chlamydophila pneumoniae NOT DETECTED NOT DETECTED Final   Mycoplasma pneumoniae NOT DETECTED NOT DETECTED Final    Comment: Performed at [facility]  Culture, Urine     Status: Abnormal   Collection Time: 08/28/16  2:20 AM  Result Value Ref Range Status   Specimen Description URINE, CATHETERIZED  Final   Special Requests NONE  Final   Culture 20,000 COLONIES/mL YEAST (A)  Final   Report Status 08/29/2016 FINAL  Final  Culture, expectorated sputum-assessment     Status: None   Collection Time: 08/29/16 12:05 PM  Result Value Ref Range Status   Specimen Description SPUTUM  Final   Special Requests Immunocompromised  Final   Sputum evaluation THIS SPECIMEN IS ACCEPTABLE FOR SPUTUM CULTURE  Final   Report Status 08/29/2016 FINAL  Final  Culture, respiratory (NON-Expectorated)     Status: None   Collection Time: 08/29/16 12:05 PM  Result Value Ref Range Status   Specimen Description SPUTUM  Final   Special Requests Immunocompromised Reflexed from [id number]  Final   Gram Stain   Final    FEW WBC PRESENT, PREDOMINANTLY MONONUCLEAR MODERATE GRAM POSITIVE COCCI IN PAIRS RARE GRAM POSITIVE RODS RARE GRAM NEGATIVE COCCI IN PAIRS    Culture   Final     Consistent with normal respiratory flora. Performed at [facility], [place] [address], [place], [place] [address]    Report Status 08/31/2016 FINAL  Final   Radiology Studies: No results found. Scheduled Meds: . apixaban  5 mg Oral BID  . atorvastatin  20 mg Oral Daily  . aztreonam  2 g Intravenous Q8H  . budesonide (PULMICORT) nebulizer solution  0.25 mg Nebulization BID  . calcium-vitamin D  1 tablet Oral Daily  . cholecalciferol  1,000 Units Oral Daily  . doxycycline  100 mg Oral BID  . feeding supplement (ENSURE ENLIVE)  237 mL Oral TID BM  . fenofibrate  160 mg Oral Daily  . ferrous sulfate  325 mg Oral BID WC  . fluticasone  1 spray Each Nare Daily  . folic acid  665 mcg Oral Daily  . hydrocortisone  25 mg Rectal QHS  . Influenza vac split quadrivalent PF  0.5 mL Intramuscular Tomorrow-1000  . ipratropium-albuterol  3 mL Nebulization TID  . methylPREDNISolone (SOLU-MEDROL) injection  40 mg Intravenous Q12H  . metoprolol  100 mg Oral BID  . montelukast  10 mg Oral Daily  . multivitamin with minerals  1 tablet Oral Daily  . pantoprazole  40 mg Oral Daily  . PARoxetine  20 mg Oral QHS  .  pyridOXINE  100 mg Oral Daily  . vitamin B-12  1,000 mcg Oral Daily   Continuous Infusions:   LOS: 5 days   Kerney Elbe, DO [facility] Pager [phone]   If 7PM-7AM, please contact night-coverage [email or web address] Password [email or web address] 08/31/2016, 8:02 PM

## 2016-09-01 LAB — COMPREHENSIVE METABOLIC PANEL
ALK PHOS: 60 U/L (ref 38–126)
ALT: 19 U/L (ref 14–54)
ANION GAP: 9 (ref 5–15)
AST: 26 U/L (ref 15–41)
Albumin: 2.8 g/dL — ABNORMAL LOW (ref 3.5–5.0)
BUN: 25 mg/dL — ABNORMAL HIGH (ref 6–20)
CALCIUM: 8.9 mg/dL (ref 8.9–10.3)
CO2: 37 mmol/L — AB (ref 22–32)
Chloride: 89 mmol/L — ABNORMAL LOW (ref 101–111)
Creatinine, Ser: 0.65 mg/dL (ref 0.44–1.00)
GFR calc non Af Amer: >60 mL/min (ref >60)
Glucose, Bld: 126 mg/dL — ABNORMAL HIGH (ref 65–99)
POTASSIUM: 4.7 mmol/L (ref 3.5–5.1)
SODIUM: 135 mmol/L (ref 135–145)
Total Bilirubin: 0.2 mg/dL — ABNORMAL LOW (ref 0.3–1.2)
Total Protein: 5.9 g/dL — ABNORMAL LOW (ref 6.5–8.1)

## 2016-09-01 LAB — CBC WITH DIFFERENTIAL/PLATELET
Basophils Absolute: 0 10*3/uL (ref 0.0–0.1)
Basophils Relative: 0 %
EOS ABS: 0 10*3/uL (ref 0.0–0.7)
EOS PCT: 0 %
HCT: 33.9 % — ABNORMAL LOW (ref 36.0–46.0)
HEMOGLOBIN: 10.2 g/dL — AB (ref 12.0–15.0)
LYMPHS ABS: 0.4 10*3/uL — AB (ref 0.7–4.0)
Lymphocytes Relative: 4 %
MCH: 24.6 pg — AB (ref 26.0–34.0)
MCHC: 30.1 g/dL (ref 30.0–36.0)
MCV: 81.9 fL (ref 78.0–100.0)
MONO ABS: 0.4 10*3/uL (ref 0.1–1.0)
MONOS PCT: 4 %
Neutro Abs: 9.3 10*3/uL — ABNORMAL HIGH (ref 1.7–7.7)
Neutrophils Relative %: 92 %
PLATELETS: 168 10*3/uL (ref 150–400)
RBC: 4.14 MIL/uL (ref 3.87–5.11)
RDW: 15.9 % — ABNORMAL HIGH (ref 11.5–15.5)
WBC: 10.2 10*3/uL (ref 4.0–10.5)

## 2016-09-01 LAB — MAGNESIUM: Magnesium: 1.8 mg/dL (ref 1.7–2.4)

## 2016-09-01 LAB — PHOSPHORUS: PHOSPHORUS: 2.3 mg/dL — AB (ref 2.5–4.6)

## 2016-09-01 MED ORDER — METHYLPREDNISOLONE SODIUM SUCC 125 MG IJ SOLR
60.0000 mg | Freq: Four times a day (QID) | INTRAMUSCULAR | Status: DC
  Administered 2016-09-01 – 2016-09-02 (×4): 60 mg via INTRAVENOUS
  Filled 2016-09-01 (×4): qty 2

## 2016-09-01 NOTE — Progress Notes (Signed)
PROGRESS NOTE    Tricia Hernandez  WGN:[id number] DOB: [birth year] DOA: 08/25/2016 PCP: Clinton Quant, MD   Brief Narrative:   83 y.o. female with recently diagnosed anal cancer on radiation therapy and has had recent diverting colostomy was brought to the ER after patient was having persistent shortness of breath with subjective feeling of fever and chills and nausea. Patient admitted and continued on IV antibiotics. She required more than her baseline oxygen (on 3L at home) and required 4-5L at present time.  Family agreed patient seemed slightly confused on 08/27/16 and as such labs were ordered and CT of the head ordered as well as CT of the chest (this was done at Dr. Geralyn Flash request). Patient found to have a Pneumonia and being treated with Steroids and Abx. Patient is slowly improving. Assessment & Plan:   Principal Problem:   Acute respiratory failure with hypoxia (HCC) Active Problems:   Pulmonary embolus (HCC)   Anal cancer (HCC)   Elevated troponin   Atrial fibrillation (HCC), CHA2DS2-VASc Score 5   Essential hypertension   HCAP (healthcare-associated pneumonia)  Acute respiratory failure with hypoxia likely from PNA; improving - Was 84% on 2 Liters on Admission. Improved to 96% on 3 Liters yesterday; Currently maintaining on 2 Liters - C/w Aztreonam (day 7) and Doxycycline (day 5)  for Atypical Coverage; Multiple Drug Allergies; Will discuss with Pharmacy on what to change to po - Influenza PCR Negative - Follow blood cultures; Showed NGTD x 5days - Respiratory Panel Negative - Continue Nebs with Budesonide and scheduled and prn DuoNeb - CTA was obtained and showed bilateral patchy nodular ground glass densities/consolidations in Right > Left Lung; Has known in past nodular densities in past. Carcinoid vs. Metastasis per notes - Increased Solumedrol 40 q12h as patient was still significantly wheezing to 60 mg q6h - Continue with Flutter Valve - Respiratory Cx consistent  with Normal Respiratory Flora - Continue to Monitor closely; Possible D/C in AM  Elevated Troponins - elevated troponin likely from respiratory distress and hypoxia, atrial fibrillation - troponin appear Flat - Unlikely ACS at this point. Will continue to Monitor and low threshold for Cardiac Eval  Altered Mental Status - Much Improved. - CT head showed atrophy and chronic microvascular ischemia with no acute abnormality and mucosal edema in the sphenoid sinus - Ammonia level 44 - U/A not specific and Urine Cx showed 20,000 CFU of Yeast  Atrial Fibrillation  - chads 2 vasc score of 5.  - on Apixaban and metoprolol 100 mg po BID  History of Pulmonary Embolism - Currently on Apixaban  Chronic Anemia and Thrombocytopenia, improving - Likely 2/2 to illness - follow CBC  Recently diagnosed anal squamous cell carcinoma - Has diverting colostomy - On radiation therapy; Went for Treatment #12 out of 25 Planned Course Treatments yesterday  Lung nodules with history of carcinoid which was resected - Oncologist is planning to have PET scan done next week to see if patient has metastasis or known carcinoid next week; May need to Reschedule if still in the Hospital - Appreciate Dr. Lindi Adie who will see patient in consultation and additional recc's - CT Chest as above  DVT prophylaxis: Apixaban. Code Status: Full code.  Family Communication: No Family present at bedside Disposition Plan: Skilled nursing facility possibly tomorrow  Consultants:   Oncology Dr. Lindi Adie  Procedures:   None  Antimicrobials:  Anti-infectives    Start     Dose/Rate Route Frequency Ordered Stop   08/29/16 1400  doxycycline (  VIBRA-TABS) tablet 100 mg     100 mg Oral 2 times daily 08/29/16 1334     08/29/16 1200  doxycycline (VIBRA-TABS) tablet 100 mg  Status:  Discontinued     100 mg Oral 2 times daily 08/29/16 1031 08/29/16 1333   08/27/16 0600  vancomycin (VANCOCIN) 1,250 mg in sodium chloride 0.9 %  250 mL IVPB  Status:  Discontinued     1,250 mg 166.7 mL/hr over 90 Minutes Intravenous Every 24 hours 08/26/16 0328 08/29/16 1031   08/26/16 1000  aztreonam (AZACTAM) 2 GM IVPB  Status:  Discontinued     2 g 100 mL/hr over 30 Minutes Intravenous Every 8 hours 08/26/16 0328 08/26/16 0938   08/26/16 0200  aztreonam (AZACTAM) 2 GM IVPB     2 g 100 mL/hr over 30 Minutes Intravenous Every 8 hours 08/26/16 0147     08/26/16 0130  vancomycin (VANCOCIN) 2,000 mg in sodium chloride 0.9 % 500 mL IVPB     2,000 mg 250 mL/hr over 120 Minutes Intravenous  Once 08/26/16 0122 08/26/16 0505   08/26/16 0115  aztreonam (AZACTAM) 2 g in dextrose 5 % 50 mL IVPB  Status:  Discontinued     2 g 100 mL/hr over 30 Minutes Intravenous  Once 08/26/16 0107 08/26/16 0142   08/26/16 0115  vancomycin (VANCOCIN) IVPB 1000 mg/200 mL premix  Status:  Discontinued     1,000 mg 200 mL/hr over 60 Minutes Intravenous  Once 08/26/16 0107 08/26/16 0122      Subjective: Doing well today. Still had wheezing but no complaints. Wanted to know why her colostomy bag had air in it. No Nausea and coughing up sputum.   Objective: Vitals:   09/01/16 0848 09/01/16 0850 09/01/16 1402 09/01/16 1532  BP:   129/72   Pulse:   (!) 103   Resp:   18   Temp:   97.5 F (36.4 C)   TempSrc:   Oral   SpO2: 95% 95% 97% 94%  Weight:      Height:        Intake/Output Summary (Last 24 hours) at 09/01/16 1737 Last data filed at 09/01/16 1300  Gross per 24 hour  Intake              410 ml  Output              650 ml  Net             -240 ml   Filed Weights   08/25/16 2312 08/26/16 0237  Weight: 112.4 kg (247 lb 12.8 oz) 110.5 kg (243 lb 9.7 oz)   Examination:  General exam: Appears calm and comfortable, NAD Respiratory system: Bilateral diminished breath sounds with expiratory wheezing still and mild rhochi; Patient is not tachypenic or using any accessory muscles to breathe. Wearing O2 via [place].  Cardiovascular system: S1 & S2  heard, irregularly irregular with slightly tachycardic rate. No JVD, murmurs, rubs, gallops or clicks. No pedal edema. Gastrointestinal system: Abdomen is nondistended, soft and nontender. No organomegaly or masses felt. Normal bowel sounds heard x4. Colostomy in place with Air and Stool.  Central nervous system: Alert and oriented. No focal deficits appreciated.  Extremities: No joint deformities.  Skin: No rashes, lesions or ulcers on limited skin evaluation.  Psychiatry: Mood & affect appropriate.  Data Reviewed: I have personally reviewed following labs and imaging studies  CBC:  Recent Labs Lab 08/27/16 1127 08/28/16 1779 08/29/16 0537 08/30/16 0844 09/01/16 0532  WBC 5.3 6.9 5.4 8.9 10.2  NEUTROABS 4.2 5.6 4.7 8.2* 9.3*  HGB 8.8* 9.2* 9.0* 10.0* 10.2*  HCT 31.2* 31.9* 30.9* 33.6* 33.9*  MCV 87.4 85.8 84.0 83.4 81.9  PLT 116* 119* 135* 173 626   Basic Metabolic Panel:  Recent Labs Lab 08/27/16 1127 08/28/16 0954 08/29/16 0537 08/30/16 0844 09/01/16 0532  NA 138 138 139 138 135  K 4.8 4.3 4.6 4.3 4.7  CL 95* 94* 93* 91* 89*  CO2 38* 38* 39* 41* 37*  GLUCOSE 123* 132* 142* 134* 126*  BUN '14 16 17 20 '$ 25*  CREATININE 0.61 0.52 0.56 0.57 0.65  CALCIUM 8.7* 8.9 8.9 9.0 8.9  MG  --  1.8 1.8 1.8 1.8  PHOS  --  2.4* 2.7 2.6 2.3*   GFR: Estimated Creatinine Clearance: 64.8 mL/min (by C-G formula based on SCr of 0.65 mg/dL). Liver Function Tests:  Recent Labs Lab 08/26/16 0558 08/28/16 0954 08/29/16 0537 08/30/16 0844 09/01/16 0532  AST 34 '23 20 24 26  '$ ALT '23 19 19 22 19  '$ ALKPHOS 58 68 64 69 60  BILITOT 0.4 0.6 0.3 0.3 0.2*  PROT 6.6 6.4* 6.4* 6.8 5.9*  ALBUMIN 3.1* 2.9* 2.8* 2.9* 2.8*   No results for input(s): LIPASE, AMYLASE in the last 168 hours.  Recent Labs Lab 08/27/16 1618  AMMONIA 44*   Coagulation Profile: No results for input(s): INR, PROTIME in the last 168 hours. Cardiac Enzymes:  Recent Labs Lab 08/25/16 2340 08/26/16 0558  08/26/16 1106 08/26/16 1518 08/27/16 1127  TROPONINI 0.15* 0.16* 0.16* 0.14* 0.15*   BNP (last 3 results) No results for input(s): PROBNP in the last 8760 hours. HbA1C: No results for input(s): HGBA1C in the last 72 hours. CBG: No results for input(s): GLUCAP in the last 168 hours. Lipid Profile: No results for input(s): CHOL, HDL, LDLCALC, TRIG, CHOLHDL, LDLDIRECT in the last 72 hours. Thyroid Function Tests: No results for input(s): TSH, T4TOTAL, FREET4, T3FREE, THYROIDAB in the last 72 hours. Anemia Panel: No results for input(s): VITAMINB12, FOLATE, FERRITIN, TIBC, IRON, RETICCTPCT in the last 72 hours. Sepsis Labs:  Recent Labs Lab 08/26/16 0146  LATICACIDVEN 0.91    Recent Results (from the past 240 hour(s))  Blood Culture (routine x 2)     Status: None   Collection Time: 08/26/16  1:40 AM  Result Value Ref Range Status   Specimen Description BLOOD LEFT ARM  Final   Special Requests BOTTLES DRAWN AEROBIC AND ANAEROBIC 5ML  Final   Culture   Final    NO GROWTH 5 DAYS Performed at [facility], [place] [address], [place], [place] [address]    Report Status 08/31/2016 FINAL  Final  Blood Culture (routine x 2)     Status: None   Collection Time: 08/26/16  1:45 AM  Result Value Ref Range Status   Specimen Description BLOOD RIGHT ARM  Final   Special Requests BOTTLES DRAWN AEROBIC AND ANAEROBIC 5ML  Final   Culture   Final    NO GROWTH 5 DAYS Performed at [facility], [place] [address], [place],  [address]    Report Status 08/31/2016 FINAL  Final  MRSA PCR Screening     Status: None   Collection Time: 08/26/16  3:11 AM  Result Value Ref Range Status   MRSA by PCR NEGATIVE NEGATIVE Final    Comment:        The GeneXpert MRSA Assay (FDA approved for NASAL specimens only), is one  component of a comprehensive MRSA colonization surveillance program. It is not intended to diagnose MRSA infection nor to guide or monitor treatment for MRSA  infections.   Respiratory Panel by PCR     Status: None   Collection Time: 08/26/16 10:00 AM  Result Value Ref Range Status   Adenovirus NOT DETECTED NOT DETECTED Final   Coronavirus 229E NOT DETECTED NOT DETECTED Final   Coronavirus HKU1 NOT DETECTED NOT DETECTED Final   Coronavirus NL63 NOT DETECTED NOT DETECTED Final   Coronavirus OC43 NOT DETECTED NOT DETECTED Final   Metapneumovirus NOT DETECTED NOT DETECTED Final   Rhinovirus / Enterovirus NOT DETECTED NOT DETECTED Final   Influenza A NOT DETECTED NOT DETECTED Final   Influenza B NOT DETECTED NOT DETECTED Final   Parainfluenza Virus 1 NOT DETECTED NOT DETECTED Final   Parainfluenza Virus 2 NOT DETECTED NOT DETECTED Final   Parainfluenza Virus 3 NOT DETECTED NOT DETECTED Final   Parainfluenza Virus 4 NOT DETECTED NOT DETECTED Final   Respiratory Syncytial Virus NOT DETECTED NOT DETECTED Final   Bordetella pertussis NOT DETECTED NOT DETECTED Final   Chlamydophila pneumoniae NOT DETECTED NOT DETECTED Final   Mycoplasma pneumoniae NOT DETECTED NOT DETECTED Final    Comment: Performed at [facility]  Culture, Urine     Status: Abnormal   Collection Time: 08/28/16  2:20 AM  Result Value Ref Range Status   Specimen Description URINE, CATHETERIZED  Final   Special Requests NONE  Final   Culture 20,000 COLONIES/mL YEAST (A)  Final   Report Status 08/29/2016 FINAL  Final  Culture, expectorated sputum-assessment     Status: None   Collection Time: 08/29/16 12:05 PM  Result Value Ref Range Status   Specimen Description SPUTUM  Final   Special Requests Immunocompromised  Final   Sputum evaluation THIS SPECIMEN IS ACCEPTABLE FOR SPUTUM CULTURE  Final   Report Status 08/29/2016 FINAL  Final  Culture, respiratory (NON-Expectorated)     Status: None   Collection Time: 08/29/16 12:05 PM  Result Value Ref Range Status   Specimen Description SPUTUM  Final   Special Requests Immunocompromised Reflexed from [id number]  Final   Gram  Stain   Final    FEW WBC PRESENT, PREDOMINANTLY MONONUCLEAR MODERATE GRAM POSITIVE COCCI IN PAIRS RARE GRAM POSITIVE RODS RARE GRAM NEGATIVE COCCI IN PAIRS    Culture   Final    Consistent with normal respiratory flora. Performed at [facility], [place] [address] [place], [place] [address]    Report Status 08/31/2016 FINAL  Final   Radiology Studies: No results found. Scheduled Meds: . apixaban  5 mg Oral BID  . atorvastatin  20 mg Oral Daily  . aztreonam  2 g Intravenous Q8H  . budesonide (PULMICORT) nebulizer solution  0.25 mg Nebulization BID  . calcium-vitamin D  1 tablet Oral Daily  . cholecalciferol  1,000 Units Oral Daily  . doxycycline  100 mg Oral BID  . feeding supplement (ENSURE ENLIVE)  237 mL Oral TID BM  . fenofibrate  160 mg Oral Daily  . ferrous sulfate  325 mg Oral BID WC  . fluticasone  1 spray Each Nare Daily  . folic acid  237 mcg Oral Daily  . hydrocortisone  25 mg Rectal QHS  . Influenza vac split quadrivalent PF  0.5 mL Intramuscular Tomorrow-1000  . ipratropium-albuterol  3 mL Nebulization TID  . methylPREDNISolone (SOLU-MEDROL) injection  60 mg Intravenous Q6H  . metoprolol  100 mg Oral  BID  . montelukast  10 mg Oral Daily  . multivitamin with minerals  1 tablet Oral Daily  . pantoprazole  40 mg Oral Daily  . PARoxetine  20 mg Oral QHS  . pyridOXINE  100 mg Oral Daily  . vitamin B-12  1,000 mcg Oral Daily   Continuous Infusions:   LOS: 6 days   Kerney Elbe, DO [facility] Pager [phone]   If 7PM-7AM, please contact night-coverage [email or web address] Password [email or web address] 09/01/2016, 5:37 PM

## 2016-09-01 NOTE — Progress Notes (Signed)
Pharmacy Antibiotic Note  Tricia Hernandez is a 83 y.o. female  C/o cough, congestion and wheezing admitted on 08/25/2016 with pneumonia.  Pharmacy has been consulted for azactam dosing.  Today, 09/01/2016: - day #7 of aztreonam - afeb, wbc wnl - scr stable (crcl~65)   Plan:  Continue Azactam 2 Gm IV q8h  Doxycycline '100mg'$  PO BID.  With 7 days of abx, if appropriate, consider d/c aztreonam   _____________________________  Height: '5\' 4"'$  (162.6 cm) Weight: 243 lb 9.7 oz (110.5 kg) IBW/kg (Calculated) : 54.7  Temp (24hrs), Avg:98.1 F (36.7 C), Min:97.9 F (36.6 C), Max:98.2 F (36.8 C)   Recent Labs Lab 08/26/16 0146  08/27/16 1127 08/28/16 0954 08/29/16 0537 08/30/16 0844 09/01/16 0532  WBC  --   < > 5.3 6.9 5.4 8.9 10.2  CREATININE  --   < > 0.61 0.52 0.56 0.57 0.65  LATICACIDVEN 0.91  --   --   --   --   --   --   < > = values in this interval not displayed.  Estimated Creatinine Clearance: 64.8 mL/min (by C-G formula based on SCr of 0.65 mg/dL).    Allergies  Allergen Reactions  . Aleve [Naproxen Sodium] Hives  . Antihistamines, Chlorpheniramine-Type Other (See Comments)    Reaction:  Unknown   . Aspirin Other (See Comments)    Reaction:  Unknown   . Ciprofloxacin Other (See Comments)    Reaction:  Unknown   . Codeine Other (See Comments)    Reaction:  Unknown   . Penicillins Other (See Comments)    Reaction:  Unknown  Has patient had a PCN reaction causing immediate rash, facial/tongue/throat swelling, SOB or lightheadedness with hypotension: Unsure Has patient had a PCN reaction causing severe rash involving mucus membranes or skin necrosis: Unsure Has patient had a PCN reaction that required hospitalization Unsure Has patient had a PCN reaction occurring within the last 10 years: Unsure If all of the above answers are "NO", then may proceed with Cephalosporin use.   . Rocephin [Ceftriaxone Sodium In Dextrose] Other (See Comments)    Reaction:  Unknown    . Sulfa Antibiotics Other (See Comments)    Reaction:  Unknown   . Hydrochlorothiazide Rash    Antimicrobials this admission:  1/15 azactam >>  1/15 vancomycin >> 1/18 1/18 Doxycycline (atypical coverage)>>  Dose adjustments this admission:  n/a  Microbiology results:  1/15 BCx x2: neg FINAL 1/15 MRSA PCR: neg 1/15 influenza A/B: neg 1/17 UCx: 20k yeast FINAL 1/15 Resp PCR: negative 1/18 sputum: moderate GPC in pairs, rare GNR in pairs and GPR; normal flora FINAL  Thank you for allowing pharmacy to be a part of this patient's care.  Dia Sitter, PharmD, BCPS 09/01/2016 11:47 AM

## 2016-09-02 ENCOUNTER — Ambulatory Visit (HOSPITAL_COMMUNITY): Payer: Medicare Other

## 2016-09-02 ENCOUNTER — Ambulatory Visit
Admission: RE | Admit: 2016-09-02 | Discharge: 2016-09-02 | Disposition: A | Discharge status: Home / Self Care | Payer: Medicare Other | Source: Ambulatory Visit | Attending: Radiation Oncology | Admitting: Radiation Oncology

## 2016-09-02 DIAGNOSIS — E559 Vitamin D deficiency, unspecified: Secondary | ICD-10-CM | POA: Diagnosis not present

## 2016-09-02 DIAGNOSIS — J189 Pneumonia, unspecified organism: Secondary | ICD-10-CM | POA: Diagnosis not present

## 2016-09-02 DIAGNOSIS — K219 Gastro-esophageal reflux disease without esophagitis: Secondary | ICD-10-CM | POA: Diagnosis not present

## 2016-09-02 DIAGNOSIS — Z8249 Family history of ischemic heart disease and other diseases of the circulatory system: Secondary | ICD-10-CM | POA: Diagnosis not present

## 2016-09-02 DIAGNOSIS — C211 Malignant neoplasm of anal canal: Secondary | ICD-10-CM | POA: Diagnosis not present

## 2016-09-02 DIAGNOSIS — M6281 Muscle weakness (generalized): Secondary | ICD-10-CM | POA: Diagnosis not present

## 2016-09-02 DIAGNOSIS — R748 Abnormal levels of other serum enzymes: Secondary | ICD-10-CM | POA: Diagnosis not present

## 2016-09-02 DIAGNOSIS — I493 Ventricular premature depolarization: Secondary | ICD-10-CM | POA: Diagnosis not present

## 2016-09-02 DIAGNOSIS — Z87891 Personal history of nicotine dependence: Secondary | ICD-10-CM | POA: Diagnosis not present

## 2016-09-02 DIAGNOSIS — R300 Dysuria: Secondary | ICD-10-CM | POA: Diagnosis not present

## 2016-09-02 DIAGNOSIS — I4891 Unspecified atrial fibrillation: Secondary | ICD-10-CM | POA: Diagnosis not present

## 2016-09-02 DIAGNOSIS — K6289 Other specified diseases of anus and rectum: Secondary | ICD-10-CM | POA: Diagnosis not present

## 2016-09-02 DIAGNOSIS — E784 Other hyperlipidemia: Secondary | ICD-10-CM | POA: Diagnosis not present

## 2016-09-02 DIAGNOSIS — Q638 Other specified congenital malformations of kidney: Secondary | ICD-10-CM | POA: Diagnosis not present

## 2016-09-02 DIAGNOSIS — Z86711 Personal history of pulmonary embolism: Secondary | ICD-10-CM | POA: Diagnosis not present

## 2016-09-02 DIAGNOSIS — J9621 Acute and chronic respiratory failure with hypoxia: Secondary | ICD-10-CM | POA: Diagnosis not present

## 2016-09-02 DIAGNOSIS — Z923 Personal history of irradiation: Secondary | ICD-10-CM | POA: Diagnosis not present

## 2016-09-02 DIAGNOSIS — I471 Supraventricular tachycardia: Secondary | ICD-10-CM | POA: Diagnosis present

## 2016-09-02 DIAGNOSIS — I10 Essential (primary) hypertension: Secondary | ICD-10-CM | POA: Diagnosis not present

## 2016-09-02 DIAGNOSIS — E785 Hyperlipidemia, unspecified: Secondary | ICD-10-CM | POA: Diagnosis not present

## 2016-09-02 DIAGNOSIS — Z803 Family history of malignant neoplasm of breast: Secondary | ICD-10-CM | POA: Diagnosis not present

## 2016-09-02 DIAGNOSIS — Z6841 Body Mass Index (BMI) 40.0 and over, adult: Secondary | ICD-10-CM | POA: Diagnosis not present

## 2016-09-02 DIAGNOSIS — I481 Persistent atrial fibrillation: Secondary | ICD-10-CM | POA: Diagnosis not present

## 2016-09-02 DIAGNOSIS — D649 Anemia, unspecified: Secondary | ICD-10-CM | POA: Diagnosis not present

## 2016-09-02 DIAGNOSIS — Z886 Allergy status to analgesic agent status: Secondary | ICD-10-CM | POA: Diagnosis not present

## 2016-09-02 DIAGNOSIS — Z510 Encounter for antineoplastic radiation therapy: Secondary | ICD-10-CM | POA: Diagnosis not present

## 2016-09-02 DIAGNOSIS — I2699 Other pulmonary embolism without acute cor pulmonale: Secondary | ICD-10-CM | POA: Diagnosis not present

## 2016-09-02 DIAGNOSIS — Z807 Family history of other malignant neoplasms of lymphoid, hematopoietic and related tissues: Secondary | ICD-10-CM | POA: Diagnosis not present

## 2016-09-02 DIAGNOSIS — I5032 Chronic diastolic (congestive) heart failure: Secondary | ICD-10-CM | POA: Diagnosis not present

## 2016-09-02 DIAGNOSIS — I2782 Chronic pulmonary embolism: Secondary | ICD-10-CM | POA: Diagnosis not present

## 2016-09-02 DIAGNOSIS — E669 Obesity, unspecified: Secondary | ICD-10-CM | POA: Diagnosis not present

## 2016-09-02 DIAGNOSIS — R918 Other nonspecific abnormal finding of lung field: Secondary | ICD-10-CM | POA: Diagnosis not present

## 2016-09-02 DIAGNOSIS — Z823 Family history of stroke: Secondary | ICD-10-CM | POA: Diagnosis not present

## 2016-09-02 DIAGNOSIS — Z85118 Personal history of other malignant neoplasm of bronchus and lung: Secondary | ICD-10-CM | POA: Diagnosis not present

## 2016-09-02 DIAGNOSIS — I700 Atherosclerosis of aorta: Secondary | ICD-10-CM | POA: Diagnosis not present

## 2016-09-02 DIAGNOSIS — J80 Acute respiratory distress syndrome: Secondary | ICD-10-CM | POA: Diagnosis not present

## 2016-09-02 DIAGNOSIS — C3491 Malignant neoplasm of unspecified part of right bronchus or lung: Secondary | ICD-10-CM | POA: Diagnosis not present

## 2016-09-02 DIAGNOSIS — Z85828 Personal history of other malignant neoplasm of skin: Secondary | ICD-10-CM | POA: Diagnosis not present

## 2016-09-02 DIAGNOSIS — C210 Malignant neoplasm of anus, unspecified: Secondary | ICD-10-CM | POA: Diagnosis not present

## 2016-09-02 DIAGNOSIS — L98419 Non-pressure chronic ulcer of buttock with unspecified severity: Secondary | ICD-10-CM | POA: Diagnosis not present

## 2016-09-02 DIAGNOSIS — E6609 Other obesity due to excess calories: Secondary | ICD-10-CM | POA: Diagnosis not present

## 2016-09-02 DIAGNOSIS — F419 Anxiety disorder, unspecified: Secondary | ICD-10-CM | POA: Diagnosis not present

## 2016-09-02 DIAGNOSIS — I2510 Atherosclerotic heart disease of native coronary artery without angina pectoris: Secondary | ICD-10-CM | POA: Diagnosis not present

## 2016-09-02 DIAGNOSIS — F339 Major depressive disorder, recurrent, unspecified: Secondary | ICD-10-CM | POA: Diagnosis not present

## 2016-09-02 DIAGNOSIS — R531 Weakness: Secondary | ICD-10-CM | POA: Diagnosis not present

## 2016-09-02 DIAGNOSIS — C20 Malignant neoplasm of rectum: Secondary | ICD-10-CM | POA: Diagnosis not present

## 2016-09-02 DIAGNOSIS — J9601 Acute respiratory failure with hypoxia: Secondary | ICD-10-CM | POA: Diagnosis not present

## 2016-09-02 DIAGNOSIS — Z79899 Other long term (current) drug therapy: Secondary | ICD-10-CM | POA: Diagnosis not present

## 2016-09-02 DIAGNOSIS — I482 Chronic atrial fibrillation: Secondary | ICD-10-CM | POA: Diagnosis not present

## 2016-09-02 DIAGNOSIS — C269 Malignant neoplasm of ill-defined sites within the digestive system: Secondary | ICD-10-CM | POA: Diagnosis not present

## 2016-09-02 DIAGNOSIS — N6019 Diffuse cystic mastopathy of unspecified breast: Secondary | ICD-10-CM | POA: Diagnosis not present

## 2016-09-02 DIAGNOSIS — Z808 Family history of malignant neoplasm of other organs or systems: Secondary | ICD-10-CM | POA: Diagnosis not present

## 2016-09-02 DIAGNOSIS — R2689 Other abnormalities of gait and mobility: Secondary | ICD-10-CM | POA: Diagnosis not present

## 2016-09-02 DIAGNOSIS — K5730 Diverticulosis of large intestine without perforation or abscess without bleeding: Secondary | ICD-10-CM | POA: Diagnosis not present

## 2016-09-02 DIAGNOSIS — R031 Nonspecific low blood-pressure reading: Secondary | ICD-10-CM | POA: Diagnosis not present

## 2016-09-02 DIAGNOSIS — R911 Solitary pulmonary nodule: Secondary | ICD-10-CM | POA: Diagnosis not present

## 2016-09-02 DIAGNOSIS — F329 Major depressive disorder, single episode, unspecified: Secondary | ICD-10-CM | POA: Diagnosis not present

## 2016-09-02 LAB — COMPREHENSIVE METABOLIC PANEL
ALK PHOS: 58 U/L (ref 38–126)
ALT: 17 U/L (ref 14–54)
ANION GAP: 6 (ref 5–15)
AST: 19 U/L (ref 15–41)
Albumin: 2.7 g/dL — ABNORMAL LOW (ref 3.5–5.0)
BILIRUBIN TOTAL: 0.4 mg/dL (ref 0.3–1.2)
BUN: 28 mg/dL — ABNORMAL HIGH (ref 6–20)
CALCIUM: 9.2 mg/dL (ref 8.9–10.3)
CO2: 38 mmol/L — ABNORMAL HIGH (ref 22–32)
Chloride: 92 mmol/L — ABNORMAL LOW (ref 101–111)
Creatinine, Ser: 0.68 mg/dL (ref 0.44–1.00)
GFR calc Af Amer: >60 mL/min (ref >60)
GLUCOSE: 139 mg/dL — AB (ref 65–99)
Potassium: 5 mmol/L (ref 3.5–5.1)
Sodium: 136 mmol/L (ref 135–145)
TOTAL PROTEIN: 6 g/dL — AB (ref 6.5–8.1)

## 2016-09-02 LAB — CBC WITH DIFFERENTIAL/PLATELET
Basophils Absolute: 0 10*3/uL (ref 0.0–0.1)
Basophils Relative: 0 %
Eosinophils Absolute: 0 10*3/uL (ref 0.0–0.7)
Eosinophils Relative: 0 %
HEMATOCRIT: 33.6 % — AB (ref 36.0–46.0)
HEMOGLOBIN: 10.3 g/dL — AB (ref 12.0–15.0)
LYMPHS ABS: 0.4 10*3/uL — AB (ref 0.7–4.0)
LYMPHS PCT: 3 %
MCH: 24.5 pg — AB (ref 26.0–34.0)
MCHC: 30.7 g/dL (ref 30.0–36.0)
MCV: 79.8 fL (ref 78.0–100.0)
MONO ABS: 0.2 10*3/uL (ref 0.1–1.0)
MONOS PCT: 1 %
Neutro Abs: 11.5 10*3/uL — ABNORMAL HIGH (ref 1.7–7.7)
Neutrophils Relative %: 96 %
Platelets: 169 10*3/uL (ref 150–400)
RBC: 4.21 MIL/uL (ref 3.87–5.11)
RDW: 15.6 % — AB (ref 11.5–15.5)
WBC: 12.1 10*3/uL — ABNORMAL HIGH (ref 4.0–10.5)

## 2016-09-02 LAB — PHOSPHORUS: Phosphorus: 4.2 mg/dL (ref 2.5–4.6)

## 2016-09-02 LAB — MAGNESIUM: MAGNESIUM: 1.9 mg/dL (ref 1.7–2.4)

## 2016-09-02 MED ORDER — HYDROCODONE-ACETAMINOPHEN 5-325 MG PO TABS: 1.0000 | ORAL_TABLET | ORAL | 0 refills | Status: DC | PRN

## 2016-09-02 MED ORDER — DOXYCYCLINE HYCLATE 100 MG PO TABS: 100.0000 mg | ORAL_TABLET | Freq: Two times a day (BID) | ORAL | 0 refills | Status: DC

## 2016-09-02 MED ORDER — PREDNISONE 10 MG (21) PO TBPK: 20.0000 mg | ORAL_TABLET | Freq: Every day | ORAL | 0 refills | Status: DC

## 2016-09-02 MED ORDER — BUDESONIDE 0.25 MG/2ML IN SUSP: 0.2500 mg | Freq: Two times a day (BID) | RESPIRATORY_TRACT | 12 refills | Status: DC

## 2016-09-02 MED ORDER — FLUCONAZOLE 100 MG PO TABS
200.0000 mg | ORAL_TABLET | Freq: Once | ORAL | Status: AC
  Administered 2016-09-02: 200 mg via ORAL
  Filled 2016-09-02: qty 2

## 2016-09-02 MED ORDER — FLUCONAZOLE 100 MG PO TABS: 100.0000 mg | ORAL_TABLET | Freq: Every day | ORAL | Status: DC

## 2016-09-02 MED ORDER — FLUCONAZOLE 100 MG PO TABS: 100.0000 mg | ORAL_TABLET | Freq: Every day | ORAL | 0 refills | Status: DC

## 2016-09-02 NOTE — Discharge Summary (Addendum)
Physician Discharge Summary  Tricia Hernandez ZCH:[id number] DOB: October 20, 1930 DOA: 08/25/2016  PCP: Clinton Quant, MD  Admit date: 08/25/2016 Discharge date: 09/02/2016  Admitted From: SNF Disposition:  SNF  Recommendations for Outpatient Follow-up:  1. Follow up with PCP in 1-2 weeks 2. Follow up with Oncology as an Outpatient and Have repeat PET-Scan done 3. Follow up at SNF for Yeast Infection with Treatment for 10 days 4. Please obtain BMP/CBC within 1 week   Home Health: No Equipment/Devices: None  Discharge Condition: Stable CODE STATUS: FULL CODE Diet recommendation: Heart Healthy  Brief/Interim Summary: 83 y.o.femalewith recently diagnosed anal cancer on radiation therapy and has had recent diverting colostomy was brought to the ER after patient was having persistent shortness of breath with subjective feeling of fever and chills and nausea. Patient admitted for Pneumonia and continued on IV antibiotics. She required more than her baseline oxygen (on 3L at home) and required 4-5L at present time.  Family agreed patient seemed slightly confused on 08/27/16 and as such labs were ordered and CT of the head ordered as well as CT of the chest (this was done at Dr. Geralyn Flash request). Patient found to have a Pneumonia and being treated with Steroids and Abx. Patient much improved and mentation improved and currently deemed stable to be D/C'd back to SNF and follow up with PCP and Oncology as an outpatient.  Discharge Diagnoses:  Principal Problem:   Acute respiratory failure with hypoxia (HCC) Active Problems:   Pulmonary embolus (HCC)   Anal cancer (HCC)   Elevated troponin   Atrial fibrillation ([place]), CHA2DS2-VASc Score 5   Essential hypertension   HCAP (healthcare-associated pneumonia)   Acute respiratory failure with hypoxia likely from HCAP PNA; improved - Was 81% on 2 Liters on Admission. Improved to 96% on 3 Liters yesterday; Currently maintaining on 2 Liters - D/C'd  Aztreonam as completed course; C/w Doxycycline (day 5)  for Atypical Coverage; Multiple Drug Allergies; - Influenza PCR Negative - Followblood cultures; Showed NGTD x 5days - Respiratory Panel Negative - Continue Nebs with Budesonide and scheduled and prn DuoNeb at SNF - CTA was obtained and showed bilateral patchy nodular ground glass densities/consolidations in Right > Left Lung; Has known in past nodular densities in past. Carcinoid vs. Metastasis per notes - Changed IV Solumedrol 60 mg q6h to po Sterapred Taper - Continue with Flutter Valve - Respiratory Cx consistent with Normal Respiratory Flora - Follow up Care at SNF  Elevated Troponins - elevated troponin likely from respiratory distress and hypoxia, atrial fibrillation - troponin appear Flat - Unlikely ACS at this point.   Altered Mental Status, Much Improved. - CT head showed atrophy and chronic microvascular ischemia with no acute abnormality and mucosal edema in the sphenoid sinus - Ammonia level 44 - U/A not specific and Urine Cx showed 20,000 CFU of Yeast; Patient has Genital Yeast infection - Back to Baseline  Genital Yeast Infection -Will give 1 dose of Diflucan 200 mg now -Follow up with SNF for additional Care and continue Diflucan 100 mg daily for 10 days  Atrial Fibrillation  - chads 2 vasc score of 5.  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 Lindi Adie who will see patient in consultation and additional recc's; Follow up with Oncology Dr. Lindi Adie as an outpatient  - CT Chest as above  Discharge Instructions  Discharge Instructions    Call MD for:  difficulty breathing, headache or visual disturbances    Complete by:  As directed    Call MD for:  persistant dizziness or light-headedness    Complete by:  As directed    Call MD for:  persistant nausea and vomiting    Complete by:  As directed    Call MD for:  severe uncontrolled pain    Complete by:  As directed    Call MD for:  temperature >100.4    Complete by:  As directed    Diet - low sodium heart healthy    Complete by:  As directed    Discharge instructions    Complete by:  As directed    Follow up Care at SNF. Reschedule PET-Scan and follow up with Oncologist and PCP as an outpatient.   Increase activity slowly    Complete by:  As directed      Allergies as of 09/02/2016      Reactions   Aleve [naproxen Sodium] Hives   Antihistamines, Chlorpheniramine-type Other (See Comments)   Reaction:  Unknown    Aspirin Other (See Comments)   Reaction:  Unknown    Ciprofloxacin Other (See Comments)   Reaction:  Unknown    Codeine Other (See Comments)   Reaction:  Unknown    Penicillins Other (See Comments)   Reaction:  Unknown  Has patient had a PCN reaction causing immediate rash, facial/tongue/throat swelling, SOB or lightheadedness with hypotension: Unsure Has patient had a PCN reaction causing severe rash involving mucus membranes or skin necrosis: Unsure Has patient had a PCN reaction that required hospitalization Unsure Has patient had a PCN reaction occurring within the last 10 years: Unsure If all of the above answers are "NO", then may proceed with Cephalosporin use.   Rocephin [ceftriaxone Sodium In Dextrose] Other (See Comments)   Reaction:  Unknown    Sulfa Antibiotics Other (See Comments)   Reaction:  Unknown     Hydrochlorothiazide Rash      Medication List    TAKE these medications   atorvastatin 20 MG tablet Commonly known as:  LIPITOR Take 20 mg by mouth daily.   budesonide 0.25 MG/2ML nebulizer solution Commonly known as:  PULMICORT Take 2 mLs (0.25 mg total) by nebulization 2 (two) times daily.   Calcium 600-200 MG-UNIT tablet Take 1 tablet by mouth daily.   cholecalciferol 1000 units tablet Commonly known as:  VITAMIN D Take 1,000 Units by mouth daily.   doxycycline 100 MG tablet Commonly known as:  VIBRA-TABS Take 1 tablet (100 mg total) by mouth 2 (two) times daily.   ELIQUIS 5 MG Tabs tablet Generic drug:  apixaban Take 5 mg by mouth 2 (two) times daily.   ENSURE PLUS Liqd Take 237 mLs by mouth 3 (three) times daily between meals.   fenofibrate 160 MG tablet Take 160 mg by mouth daily.   ferrous sulfate 325 (65 FE) MG tablet Take 325 mg by mouth 2 (two) times daily with a meal.   fluconazole  100 MG tablet Commonly known as:  DIFLUCAN Take 1 tablet (100 mg total) by mouth daily. Start taking on:  09/03/2016   fluticasone 50 MCG/ACT nasal spray Commonly known as:  FLONASE Place 1 spray into both nostrils daily.   folic acid 326 MCG tablet Commonly known as:  FOLVITE Take 400 mcg by mouth daily.   HYDROcodone-acetaminophen 5-325 MG tablet Commonly known as:  NORCO/VICODIN Take 1 tablet by mouth every 4 (four) hours as needed for moderate pain.   hydrocortisone 25 MG suppository Commonly known as:  ANUSOL-HC Place 25 mg rectally at bedtime.   ipratropium-albuterol 0.5-2.5 (3) MG/3ML Soln Commonly known as:  DUONEB Take 3 mLs by nebulization 2 (two) times daily.   metoprolol 100 MG tablet Commonly known as:  LOPRESSOR Take 100 mg by mouth 2 (two) times daily.   montelukast 10 MG tablet Commonly known as:  SINGULAIR Take 10 mg by mouth daily.   multivitamin with minerals Tabs tablet Take 1 tablet by mouth daily.   omeprazole 40 MG capsule Commonly  known as:  PRILOSEC Take 40 mg by mouth daily.   ondansetron 8 MG tablet Commonly known as:  ZOFRAN Take 8 mg by mouth every 6 (six) hours as needed for nausea or vomiting.   PARoxetine 20 MG tablet Commonly known as:  PAXIL Take 20 mg by mouth at bedtime.   predniSONE 10 MG (21) Tbpk tablet Commonly known as:  STERAPRED UNI-PAK 21 TAB Take 2 tablets (20 mg total) by mouth daily.   pyridOXINE 100 MG tablet Commonly known as:  VITAMIN B-6 Take 100 mg by mouth daily.   vitamin B-12 1000 MCG tablet Commonly known as:  CYANOCOBALAMIN Take 1,000 mcg by mouth daily.       Allergies  Allergen Reactions  . Aleve [Naproxen Sodium] Hives  . Antihistamines, Chlorpheniramine-Type Other (See Comments)    Reaction:  Unknown   . Aspirin Other (See Comments)    Reaction:  Unknown   . Ciprofloxacin Other (See Comments)    Reaction:  Unknown   . Codeine Other (See Comments)    Reaction:  Unknown   . Penicillins Other (See Comments)    Reaction:  Unknown  Has patient had a PCN reaction causing immediate rash, facial/tongue/throat swelling, SOB or lightheadedness with hypotension: Unsure Has patient had a PCN reaction causing severe rash involving mucus membranes or skin necrosis: Unsure Has patient had a PCN reaction that required hospitalization Unsure Has patient had a PCN reaction occurring within the last 10 years: Unsure If all of the above answers are "NO", then may proceed with Cephalosporin use.   . Rocephin [Ceftriaxone Sodium In Dextrose] Other (See Comments)    Reaction:  Unknown   . Sulfa Antibiotics Other (See Comments)    Reaction:  Unknown   . Hydrochlorothiazide Rash    Consultations:  Oncology Dr. Lindi Adie  Procedures/Studies: Dg Chest 2 View  Result Date: 08/26/2016 CLINICAL DATA:  Productive cough for 3-4 days with shortness of breath EXAM: CHEST  2 VIEW COMPARISON:  07/25/2016, 12 11,017 FINDINGS: Increased opacity in the left lower lobe, could reflect in  infiltrate. Postsurgical changes of the right thorax with linear scarring in the right mid lung. Scattered pulmonary nodules are present in the right upper and lower lobes, questionable few new small nodules in the right upper lobe. Cardiomediastinal silhouette stable with atherosclerosis. No pneumothorax. IMPRESSION: 1. Increased opacity at the left lung base could relate to an infiltrate 2. Multiple small nodules in the right  upper and lower lobes. Electronically Signed   By: Donavan Foil M.D.   On: 08/26/2016 00:38   Ct Head W & Wo Contrast  Result Date: 08/27/2016 CLINICAL DATA:  Confusion.  History of lung cancer. EXAM: CT HEAD WITHOUT AND WITH CONTRAST TECHNIQUE: Contiguous axial images were obtained from the base of the skull through the vertex without and with intravenous contrast CONTRAST:  100 mL Isovue 370 IV COMPARISON:  None. FINDINGS: Brain: Mild to moderate atrophy. Negative for hydrocephalus. Mild hypodensity in the periventricular white matter bilaterally. No acute infarct. Negative for hemorrhage or mass lesion. Normal enhancement following contrast infusion. No enhancing metastatic deposits in the brain. Vascular: Normal arterial and venous enhancement. Skull: Negative Sinuses/Orbits: Diffuse mucosal thickening sphenoid sinus. Remaining sinuses clear. Bilateral lens replacement. Other: None IMPRESSION: Atrophy and chronic microvascular ischemia.  No acute abnormality. Mucosal edema in the sphenoid sinus. Electronically Signed   By: Franchot Gallo M.D.   On: 08/27/2016 18:57   Ct Angio Chest Pe W Or Wo Contrast  Result Date: 08/27/2016 CLINICAL DATA:  Recently diagnosed anal cancer on radiation therapy, status post diverting colostomy. Now with shortness of breath, fever, chills and nausea. Chest x-ray earlier today shows opacity concerning for pneumonia. Additional history of previous partial right lung removal for mass/carcinoid. EXAM: CT ANGIOGRAPHY CHEST WITH CONTRAST TECHNIQUE:  Multidetector CT imaging of the chest was performed using the standard protocol during bolus administration of intravenous contrast. Multiplanar CT image reconstructions and MIPs were obtained to evaluate the vascular anatomy. CONTRAST:  100 cc Isovue 370 COMPARISON:  None. FINDINGS: Cardiovascular: Some of the peripheral segmental and subsegmental pulmonary artery branches are difficult to definitively characterize due to patient breathing motion artifact but there is no convincing pulmonary embolism identified within the main, lobar or central segmental pulmonary arteries. Thoracic aorta is normal in caliber, with scattered atherosclerosis. No aortic dissection. There is mild cardiomegaly.  No pericardial effusion. Mediastinum/Nodes: No mass or pathologically enlarged lymph nodes are identified within the mediastinum or perihilar regions. Largest lymph nodes identified within the right lower paratracheal space and aortopulmonary window region are within normal limits at 8-9 mm short axis dimension. Esophagus appears normal. Trachea and central bronchi are unremarkable. Lungs/Pleura: Small right pleural effusion. Moderate-sized left pleural effusion. Postsurgical changes within the right perihilar region/right middle lobe compatible with given history of previous partial lobectomy for mass/carcinoid, with presumably associated atelectasis/airspace collapse extending towards the lower hilum. Additional patchy small nodular and ground-glass density consolidations are seen throughout the periphery of both lungs, densest component within the right lower lobe. Upper Abdomen: No acute findings. Musculoskeletal: Mild degenerative change throughout the thoracic spine. No acute or suspicious osseous finding. Review of the MIP images confirms the above findings. IMPRESSION: 1. Postsurgical changes within the right perihilar lung extending towards the right middle lobe, compatible with given history of previous partial  lobectomy for mass/carcinoid, with presumably associated atelectasis/airspace collapse extending towards the lower right hilum. 2. Additional patchy nodular and ground-glass density consolidations throughout the periphery of both lungs, with densest component in the right lower lobe. Differential consideration includes both neoplastic and infectious etiologies. Favor pneumonia, perhaps atypical pneumonia such as fungal or viral. Recommend follow-up chest CT to ensure resolution, perhaps in 4-6 weeks. If there is not complete resolution, PET-CT may be needed for further characterization. 3. Moderate left pleural effusion and small right pleural effusion. 4. Cardiomegaly. 5. No pulmonary embolism seen, with mild study limitations detailed above. 6. Aortic atherosclerosis. Electronically Signed   By:  Franki Cabot M.D.   On: 08/27/2016 19:11    Subjective: Seen and examined at bedside and was much improved. Still with slight expiratory wheezing. No nausea or vomiting. No other concerns or complaints and ready to go back to SNF.  Discharge Exam: Vitals:   09/01/16 2143 09/02/16 0611  BP: 124/71 131/74  Pulse: (!) 110 (!) 106  Resp: 20 20  Temp: 97.9 F (36.6 C) 97.9 F (36.6 C)   Vitals:   09/01/16 1532 09/01/16 2143 09/02/16 0611 09/02/16 0947  BP:  124/71 131/74   Pulse:  (!) 110 (!) 106   Resp:  20 20   Temp:  97.9 F (36.6 C) 97.9 F (36.6 C)   TempSrc:  Oral Oral   SpO2: 94% 94% 98% 96%  Weight:      Height:       General: Pt is alert, awake, not in acute distress Cardiovascular: Irregularly Irregular, S1/S2 +, no rubs, no gallops Respiratory: Diminishe bilaterally with mild expiratory wheezing, no rhonchi; Patient not tachypenic or using accessory muscles to breathe wearing supplemental O2 Abdominal: Soft, NT, ND, bowel sounds +; Colostomy in place Extremities: no edema, no cyanosis  The results of significant diagnostics from this hospitalization (including imaging, microbiology,  ancillary and laboratory) are listed below for reference.    Microbiology: Recent Results (from the past 240 hour(s))  Blood Culture (routine x 2)     Status: None   Collection Time: 08/26/16  1:40 AM  Result Value Ref Range Status   Specimen Description BLOOD LEFT ARM  Final   Special Requests BOTTLES DRAWN AEROBIC AND ANAEROBIC 5ML  Final   Culture   Final    NO GROWTH 5 DAYS Performed at [facility], [address] [place], [place] [address]    Report Status 08/31/2016 FINAL  Final  Blood Culture (routine x 2)     Status: None   Collection Time: 08/26/16  1:45 AM  Result Value Ref Range Status   Specimen Description BLOOD RIGHT ARM  Final   Special Requests BOTTLES DRAWN AEROBIC AND ANAEROBIC 5ML  Final   Culture   Final    NO GROWTH 5 DAYS Performed at [facility], [place] [address] [place], [place] [address]    Report Status 08/31/2016 FINAL  Final  MRSA PCR Screening     Status: None   Collection Time: 08/26/16  3:11 AM  Result Value Ref Range Status   MRSA by PCR NEGATIVE NEGATIVE Final    Comment:        The GeneXpert MRSA Assay (FDA approved for NASAL specimens only), is one component of a comprehensive MRSA colonization surveillance program. It is not intended to diagnose MRSA infection nor to guide or monitor treatment for MRSA infections.   Respiratory Panel by PCR     Status: None   Collection Time: 08/26/16 10:00 AM  Result Value Ref Range Status   Adenovirus NOT DETECTED NOT DETECTED Final   Coronavirus 229E NOT DETECTED NOT DETECTED Final   Coronavirus HKU1 NOT DETECTED NOT DETECTED Final   Coronavirus NL63 NOT DETECTED NOT DETECTED Final   Coronavirus OC43 NOT DETECTED NOT DETECTED Final   Metapneumovirus NOT DETECTED NOT DETECTED Final   Rhinovirus / Enterovirus NOT DETECTED NOT DETECTED Final   Influenza A NOT DETECTED NOT DETECTED Final   Influenza B NOT DETECTED NOT DETECTED Final   Parainfluenza Virus 1 NOT DETECTED NOT DETECTED  Final   Parainfluenza Virus 2 NOT  DETECTED NOT DETECTED Final   Parainfluenza Virus 3 NOT DETECTED NOT DETECTED Final   Parainfluenza Virus 4 NOT DETECTED NOT DETECTED Final   Respiratory Syncytial Virus NOT DETECTED NOT DETECTED Final   Bordetella pertussis NOT DETECTED NOT DETECTED Final   Chlamydophila pneumoniae NOT DETECTED NOT DETECTED Final   Mycoplasma pneumoniae NOT DETECTED NOT DETECTED Final    Comment: Performed at [facility]  Culture, Urine     Status: Abnormal   Collection Time: 08/28/16  2:20 AM  Result Value Ref Range Status   Specimen Description URINE, CATHETERIZED  Final   Special Requests NONE  Final   Culture 20,000 COLONIES/mL YEAST (A)  Final   Report Status 08/29/2016 FINAL  Final  Culture, expectorated sputum-assessment     Status: None   Collection Time: 08/29/16 12:05 PM  Result Value Ref Range Status   Specimen Description SPUTUM  Final   Special Requests Immunocompromised  Final   Sputum evaluation THIS SPECIMEN IS ACCEPTABLE FOR SPUTUM CULTURE  Final   Report Status 08/29/2016 FINAL  Final  Culture, respiratory (NON-Expectorated)     Status: None   Collection Time: 08/29/16 12:05 PM  Result Value Ref Range Status   Specimen Description SPUTUM  Final   Special Requests Immunocompromised Reflexed from [id number]  Final   Gram Stain   Final    FEW WBC PRESENT, PREDOMINANTLY MONONUCLEAR MODERATE GRAM POSITIVE COCCI IN PAIRS RARE GRAM POSITIVE RODS RARE GRAM NEGATIVE COCCI IN PAIRS    Culture   Final    Consistent with normal respiratory flora. Performed at [facility], [place] [address], [place], [place] [address]    Report Status 08/31/2016 FINAL  Final     Labs: BNP (last 3 results)  Recent Labs  07/22/16 2028 08/25/16 2340  BNP 305.6* 742.5*   Basic Metabolic Panel:  Recent Labs Lab 08/28/16 0954 08/29/16 0537 08/30/16 0844 09/01/16 0532 09/02/16 0522  NA 138 139 138 135 136  K 4.3 4.6 4.3 4.7 5.0  CL 94* 93* 91* 89*  92*  CO2 38* 39* 41* 37* 38*  GLUCOSE 132* 142* 134* 126* 139*  BUN '16 17 20 '$ 25* 28*  CREATININE 0.52 0.56 0.57 0.65 0.68  CALCIUM 8.9 8.9 9.0 8.9 9.2  MG 1.8 1.8 1.8 1.8 1.9  PHOS 2.4* 2.7 2.6 2.3* 4.2   Liver Function Tests:  Recent Labs Lab 08/28/16 0954 08/29/16 0537 08/30/16 0844 09/01/16 0532 09/02/16 0522  AST '23 20 24 26 19  '$ ALT '19 19 22 19 17  '$ ALKPHOS 68 64 69 60 58  BILITOT 0.6 0.3 0.3 0.2* 0.4  PROT 6.4* 6.4* 6.8 5.9* 6.0*  ALBUMIN 2.9* 2.8* 2.9* 2.8* 2.7*   No results for input(s): LIPASE, AMYLASE in the last 168 hours.  Recent Labs Lab 08/27/16 1618  AMMONIA 44*   CBC:  Recent Labs Lab 08/28/16 0954 08/29/16 0537 08/30/16 0844 09/01/16 0532 09/02/16 0522  WBC 6.9 5.4 8.9 10.2 12.1*  NEUTROABS 5.6 4.7 8.2* 9.3* 11.5*  HGB 9.2* 9.0* 10.0* 10.2* 10.3*  HCT 31.9* 30.9* 33.6* 33.9* 33.6*  MCV 85.8 84.0 83.4 81.9 79.8  PLT 119* 135* 173 168 169   Cardiac Enzymes:  Recent Labs Lab 08/27/16 1127  TROPONINI 0.15*   BNP: Invalid input(s): POCBNP CBG: No results for input(s): GLUCAP in the last 168 hours. D-Dimer No results for input(s): DDIMER in the last 72 hours. Hgb A1c No results for input(s): HGBA1C in the last 72 hours. Lipid Profile No  results for input(s): CHOL, HDL, LDLCALC, TRIG, CHOLHDL, LDLDIRECT in the last 72 hours. Thyroid function studies No results for input(s): TSH, T4TOTAL, T3FREE, THYROIDAB in the last 72 hours.  Invalid input(s): FREET3 Anemia work up No results for input(s): VITAMINB12, FOLATE, FERRITIN, TIBC, IRON, RETICCTPCT in the last 72 hours. Urinalysis    Component Value Date/Time   COLORURINE YELLOW 08/27/2016 1541   APPEARANCEUR HAZY (A) 08/27/2016 1541   LABSPEC >1.046 (H) 08/27/2016 1541   PHURINE 5.0 08/27/2016 1541   GLUCOSEU NEGATIVE 08/27/2016 1541   HGBUR LARGE (A) 08/27/2016 1541   BILIRUBINUR NEGATIVE 08/27/2016 1541   KETONESUR NEGATIVE 08/27/2016 1541   PROTEINUR 30 (A) 08/27/2016 1541    NITRITE NEGATIVE 08/27/2016 1541   LEUKOCYTESUR LARGE (A) 08/27/2016 1541   Sepsis Labs Invalid input(s): PROCALCITONIN,  WBC,  LACTICIDVEN Microbiology Recent Results (from the past 240 hour(s))  Blood Culture (routine x 2)     Status: None   Collection Time: 08/26/16  1:40 AM  Result Value Ref Range Status   Specimen Description BLOOD LEFT ARM  Final   Special Requests BOTTLES DRAWN AEROBIC AND ANAEROBIC 5ML  Final   Culture   Final    NO GROWTH 5 DAYS Performed at [facility], [place] [address], [place], [place] [address]    Report Status 08/31/2016 FINAL  Final  Blood Culture (routine x 2)     Status: None   Collection Time: 08/26/16  1:45 AM  Result Value Ref Range Status   Specimen Description BLOOD RIGHT ARM  Final   Special Requests BOTTLES DRAWN AEROBIC AND ANAEROBIC 5ML  Final   Culture   Final    NO GROWTH 5 DAYS Performed at [facility], [place] [address], [place], [place] [address]    Report Status 08/31/2016 FINAL  Final  MRSA PCR Screening     Status: None   Collection Time: 08/26/16  3:11 AM  Result Value Ref Range Status   MRSA by PCR NEGATIVE NEGATIVE Final    Comment:        The GeneXpert MRSA Assay (FDA approved for NASAL specimens only), is one component of a comprehensive MRSA colonization surveillance program. It is not intended to diagnose MRSA infection nor to guide or monitor treatment for MRSA infections.   Respiratory Panel by PCR     Status: None   Collection Time: 08/26/16 10:00 AM  Result Value Ref Range Status   Adenovirus NOT DETECTED NOT DETECTED Final   Coronavirus 229E NOT DETECTED NOT DETECTED Final   Coronavirus HKU1 NOT DETECTED NOT DETECTED Final   Coronavirus NL63 NOT DETECTED NOT DETECTED Final   Coronavirus OC43 NOT DETECTED NOT DETECTED Final   Metapneumovirus NOT DETECTED NOT DETECTED Final   Rhinovirus / Enterovirus NOT DETECTED NOT DETECTED Final   Influenza A NOT DETECTED NOT DETECTED Final   Influenza B NOT  DETECTED NOT DETECTED Final   Parainfluenza Virus 1 NOT DETECTED NOT DETECTED Final   Parainfluenza Virus 2 NOT DETECTED NOT DETECTED Final   Parainfluenza Virus 3 NOT DETECTED NOT DETECTED Final   Parainfluenza Virus 4 NOT DETECTED NOT DETECTED Final   Respiratory Syncytial Virus NOT DETECTED NOT DETECTED Final   Bordetella pertussis NOT DETECTED NOT DETECTED Final   Chlamydophila pneumoniae NOT DETECTED NOT DETECTED Final   Mycoplasma pneumoniae NOT DETECTED NOT DETECTED Final    Comment: Performed at [facility]  Culture, Urine     Status: Abnormal   Collection Time: 08/28/16  2:20 AM  Result Value Ref Range Status   Specimen Description URINE, CATHETERIZED  Final   Special Requests NONE  Final   Culture 20,000 COLONIES/mL YEAST (A)  Final   Report Status 08/29/2016 FINAL  Final  Culture, expectorated sputum-assessment     Status: None   Collection Time: 08/29/16 12:05 PM  Result Value Ref Range Status   Specimen Description SPUTUM  Final   Special Requests Immunocompromised  Final   Sputum evaluation THIS SPECIMEN IS ACCEPTABLE FOR SPUTUM CULTURE  Final   Report Status 08/29/2016 FINAL  Final  Culture, respiratory (NON-Expectorated)     Status: None   Collection Time: 08/29/16 12:05 PM  Result Value Ref Range Status   Specimen Description SPUTUM  Final   Special Requests Immunocompromised Reflexed from [id number]  Final   Gram Stain   Final    FEW WBC PRESENT, PREDOMINANTLY MONONUCLEAR MODERATE GRAM POSITIVE COCCI IN PAIRS RARE GRAM POSITIVE RODS RARE GRAM NEGATIVE COCCI IN PAIRS    Culture   Final    Consistent with normal respiratory flora. Performed at [facility], [place] [address] [place], [place] [address]    Report Status 08/31/2016 FINAL  Final   Time coordinating discharge: Over 30 minutes  SIGNED:  Kerney Elbe, DO [facility] 09/02/2016, 3:39 PM Pager [phone]  If 7PM-7AM, please contact night-coverage [email or web address] Password  [email or web address]

## 2016-09-02 NOTE — Progress Notes (Signed)
[facility] Therapy Treatment Record Phone [phone]   Radiation Therapy was administered to Tricia Hernandez on: 09/02/2016  2:20 PM and was treatment # 13 out of a planned course of 25 treatments.  Radiation Treatment  1). Beam photons with 6-10 energy  2). Brachytherapy None  3). Stereotactic Radiosurgery None  4). Other Radiation None     Lasnicki,Yudit Modesitt, RT (T)

## 2016-09-02 NOTE — Progress Notes (Signed)
Patient discharged to [facility] via EMS. Report attempted to be called to [employer]- First time, was on hold for 10 minutes after speaking to Network engineer and still no answer. Second time, I was on hold for 8 minutes before being asked to call back in 10 minutes. This RN's number was given and a message was left for [facility]'s nurse to call me when they are able to.

## 2016-09-02 NOTE — Progress Notes (Signed)
Phoned [place] with Marolyn Hammock, RN caring for patient today. Explained a supplemental order of Diflucan to start tomorrow has been entered by Dr. [name]. She verbalized understanding.

## 2016-09-02 NOTE — Progress Notes (Signed)
CSW assisting with d/c planning. Pt / daughter are in agreement with d/c back to [facility] today. [facility] transport required. Medical necessity form completed. D/C Summary sent to SNF for review. Scripts included in d/c packet. # for report provided to nsg.  Werner Lean LCSW [phone]

## 2016-09-03 ENCOUNTER — Telehealth: Payer: Self-pay | Admitting: Radiation Oncology

## 2016-09-03 ENCOUNTER — Ambulatory Visit
Admission: RE | Admit: 2016-09-03 | Discharge: 2016-09-03 | Disposition: A | Discharge status: Home / Self Care | Payer: Medicare Other | Source: Ambulatory Visit | Attending: Radiation Oncology | Admitting: Radiation Oncology

## 2016-09-03 ENCOUNTER — Ambulatory Visit: Payer: Medicare Other | Admitting: Hematology and Oncology

## 2016-09-03 DIAGNOSIS — C211 Malignant neoplasm of anal canal: Secondary | ICD-10-CM | POA: Diagnosis not present

## 2016-09-03 DIAGNOSIS — C210 Malignant neoplasm of anus, unspecified: Secondary | ICD-10-CM | POA: Diagnosis not present

## 2016-09-03 DIAGNOSIS — Z510 Encounter for antineoplastic radiation therapy: Secondary | ICD-10-CM | POA: Diagnosis not present

## 2016-09-03 NOTE — Telephone Encounter (Signed)
Tammy @ [facility] reports the patient's daughter or son will bring her themselves for daily radiation therapy. Phoned patient's daughter, Collie Siad, to confirm. Collie Siad verbalized this is correct. Informed Miranda, RT on L4 of these findings.

## 2016-09-04 ENCOUNTER — Ambulatory Visit
Admission: RE | Admit: 2016-09-04 | Discharge: 2016-09-04 | Disposition: A | Discharge status: Home / Self Care | Payer: Medicare Other | Source: Ambulatory Visit | Attending: Radiation Oncology | Admitting: Radiation Oncology

## 2016-09-04 DIAGNOSIS — C211 Malignant neoplasm of anal canal: Secondary | ICD-10-CM | POA: Diagnosis not present

## 2016-09-04 DIAGNOSIS — Z510 Encounter for antineoplastic radiation therapy: Secondary | ICD-10-CM | POA: Diagnosis not present

## 2016-09-04 DIAGNOSIS — C210 Malignant neoplasm of anus, unspecified: Secondary | ICD-10-CM | POA: Diagnosis not present

## 2016-09-05 ENCOUNTER — Ambulatory Visit
Admission: RE | Admit: 2016-09-05 | Discharge: 2016-09-05 | Disposition: A | Discharge status: Home / Self Care | Payer: Medicare Other | Source: Ambulatory Visit | Attending: Radiation Oncology | Admitting: Radiation Oncology

## 2016-09-05 VITALS — BP 108/79 | HR 91 | Temp 97.8°F | Resp 18 | Wt 236.2 lb

## 2016-09-05 DIAGNOSIS — C210 Malignant neoplasm of anus, unspecified: Secondary | ICD-10-CM

## 2016-09-05 DIAGNOSIS — Z510 Encounter for antineoplastic radiation therapy: Secondary | ICD-10-CM | POA: Diagnosis not present

## 2016-09-05 DIAGNOSIS — C211 Malignant neoplasm of anal canal: Secondary | ICD-10-CM | POA: Diagnosis not present

## 2016-09-05 NOTE — Progress Notes (Signed)
  Radiation Oncology         [phone]   Name: Tricia Hernandez MRN: [id number]   Date: 09/05/2016  DOB: 20-Jul-1931   Weekly Radiation Therapy Management    ICD-9-CM ICD-10-CM   1. Anal cancer ([place]) 154.3 C21.0     Current Dose: 28.8 Gy  Planned Dose:  45 Gy  Narrative The patient presents for routine under treatment assessment.   Weight and vitals stable. Denies pain. Patient denies nausea or vomiting. Loose stool is noted in colostomy bag without blood. Patient reports her yeast infection is improving. She denies dysuria, hematuria, or rectal pain. She reports increased GERD. She notes the facility has provided her with medication to manage reflux. Reports supplementing her diet with ensure. Reports her energy level has improved since last week.  Set-up films were reviewed. The chart was checked.  Physical Findings  weight is 236 lb 3.2 oz (107.1 kg). Her oral temperature is 97.8 F (36.6 C). Her blood pressure is 108/79 and her pulse is 91. Her respiration is 18 and oxygen saturation is 100%.  Weight essentially stable.  No significant changes. In wheelchair. Oxygen via nasal cannula.   Impression The patient is tolerating radiation.  Plan Continue treatment as planned. Patient was given an acid reflux pill.     Sheral Apley [name], M.D.  This document serves as a record of services personally performed by Tyler Pita, MD. It was created on his behalf by Bethann Humble, a trained medical scribe. The creation of this record is based on the scribe's personal observations and the provider's statements to them. This document has been checked and approved by the attending provider.

## 2016-09-05 NOTE — Progress Notes (Addendum)
Weight loss noted during hospitalization for pneumonia. Vitals stable. Continuous oxygen therapy 2 liters via nasal cannula noted. Denies pain. Denies nausea or vomiting. Loose stool noted in colostomy bag without blood. Reports yeast infection is improving. Denies dysuria or hematuria. Denies rectal pain. Reports increased GERD. Reports facility has provided her with medication to manage reflux. Reports supplementing her diet with ENSURE. Reports her energy level this week is better than last week.   BP 108/79 (BP Location: Left Arm, Patient Position: Sitting, Cuff Size: Large)   Pulse 91   Temp 97.8 F (36.6 C) (Oral)   Resp 18   Wt 236 lb 3.2 oz (107.1 kg)   SpO2 100%   BMI 40.54 kg/m  Wt Readings from Last 3 Encounters:  09/05/16 236 lb 3.2 oz (107.1 kg)  08/26/16 243 lb 9.7 oz (110.5 kg)  08/30/16 243 lb 1.6 oz (110.3 kg)

## 2016-09-06 ENCOUNTER — Ambulatory Visit
Admission: RE | Admit: 2016-09-06 | Discharge: 2016-09-06 | Disposition: A | Discharge status: Home / Self Care | Payer: Medicare Other | Source: Ambulatory Visit | Attending: Radiation Oncology | Admitting: Radiation Oncology

## 2016-09-06 DIAGNOSIS — I482 Chronic atrial fibrillation: Secondary | ICD-10-CM | POA: Diagnosis not present

## 2016-09-06 DIAGNOSIS — C210 Malignant neoplasm of anus, unspecified: Secondary | ICD-10-CM | POA: Diagnosis not present

## 2016-09-06 DIAGNOSIS — C211 Malignant neoplasm of anal canal: Secondary | ICD-10-CM | POA: Diagnosis not present

## 2016-09-06 DIAGNOSIS — Z510 Encounter for antineoplastic radiation therapy: Secondary | ICD-10-CM | POA: Diagnosis not present

## 2016-09-06 DIAGNOSIS — C20 Malignant neoplasm of rectum: Secondary | ICD-10-CM | POA: Diagnosis not present

## 2016-09-06 DIAGNOSIS — J9621 Acute and chronic respiratory failure with hypoxia: Secondary | ICD-10-CM | POA: Diagnosis not present

## 2016-09-06 DIAGNOSIS — R918 Other nonspecific abnormal finding of lung field: Secondary | ICD-10-CM | POA: Diagnosis not present

## 2016-09-09 ENCOUNTER — Ambulatory Visit
Admission: RE | Admit: 2016-09-09 | Discharge: 2016-09-09 | Disposition: A | Discharge status: Home / Self Care | Payer: Medicare Other | Source: Ambulatory Visit | Attending: Radiation Oncology | Admitting: Radiation Oncology

## 2016-09-09 DIAGNOSIS — Z510 Encounter for antineoplastic radiation therapy: Secondary | ICD-10-CM | POA: Diagnosis not present

## 2016-09-09 DIAGNOSIS — C211 Malignant neoplasm of anal canal: Secondary | ICD-10-CM | POA: Diagnosis not present

## 2016-09-09 DIAGNOSIS — C210 Malignant neoplasm of anus, unspecified: Secondary | ICD-10-CM | POA: Diagnosis not present

## 2016-09-10 ENCOUNTER — Ambulatory Visit
Admission: RE | Admit: 2016-09-10 | Discharge: 2016-09-10 | Disposition: A | Discharge status: Home / Self Care | Payer: Medicare Other | Source: Ambulatory Visit | Attending: Radiation Oncology | Admitting: Radiation Oncology

## 2016-09-10 ENCOUNTER — Telehealth: Payer: Self-pay | Admitting: Hematology and Oncology

## 2016-09-10 DIAGNOSIS — Z510 Encounter for antineoplastic radiation therapy: Secondary | ICD-10-CM | POA: Diagnosis not present

## 2016-09-10 DIAGNOSIS — C211 Malignant neoplasm of anal canal: Secondary | ICD-10-CM | POA: Diagnosis not present

## 2016-09-10 DIAGNOSIS — C210 Malignant neoplasm of anus, unspecified: Secondary | ICD-10-CM | POA: Diagnosis not present

## 2016-09-10 NOTE — Telephone Encounter (Signed)
Pt daughter came into office to sch follow up appt to review PET scan results. Gave pt appt date/time 2/6 at 3 pm per request

## 2016-09-11 ENCOUNTER — Ambulatory Visit
Admission: RE | Admit: 2016-09-11 | Discharge: 2016-09-11 | Disposition: A | Discharge status: Home / Self Care | Payer: Medicare Other | Source: Ambulatory Visit | Attending: Radiation Oncology | Admitting: Radiation Oncology

## 2016-09-11 DIAGNOSIS — C210 Malignant neoplasm of anus, unspecified: Secondary | ICD-10-CM | POA: Diagnosis not present

## 2016-09-11 DIAGNOSIS — C211 Malignant neoplasm of anal canal: Secondary | ICD-10-CM | POA: Diagnosis not present

## 2016-09-11 DIAGNOSIS — Z510 Encounter for antineoplastic radiation therapy: Secondary | ICD-10-CM | POA: Diagnosis not present

## 2016-09-12 ENCOUNTER — Ambulatory Visit
Admission: RE | Admit: 2016-09-12 | Discharge: 2016-09-12 | Disposition: A | Discharge status: Home / Self Care | Payer: Medicare Other | Source: Ambulatory Visit | Attending: Radiation Oncology | Admitting: Radiation Oncology

## 2016-09-12 DIAGNOSIS — R031 Nonspecific low blood-pressure reading: Secondary | ICD-10-CM | POA: Diagnosis not present

## 2016-09-12 DIAGNOSIS — Z510 Encounter for antineoplastic radiation therapy: Secondary | ICD-10-CM | POA: Diagnosis not present

## 2016-09-12 DIAGNOSIS — M6281 Muscle weakness (generalized): Secondary | ICD-10-CM | POA: Diagnosis not present

## 2016-09-12 DIAGNOSIS — C3491 Malignant neoplasm of unspecified part of right bronchus or lung: Secondary | ICD-10-CM | POA: Diagnosis not present

## 2016-09-12 DIAGNOSIS — C211 Malignant neoplasm of anal canal: Secondary | ICD-10-CM | POA: Diagnosis not present

## 2016-09-12 DIAGNOSIS — C210 Malignant neoplasm of anus, unspecified: Secondary | ICD-10-CM | POA: Diagnosis not present

## 2016-09-13 ENCOUNTER — Ambulatory Visit
Admission: RE | Admit: 2016-09-13 | Discharge: 2016-09-13 | Disposition: A | Discharge status: Home / Self Care | Payer: Medicare Other | Source: Ambulatory Visit | Attending: Radiation Oncology | Admitting: Radiation Oncology

## 2016-09-13 VITALS — BP 111/59 | HR 93 | Temp 98.2°F | Resp 18 | Wt 234.6 lb

## 2016-09-13 DIAGNOSIS — C210 Malignant neoplasm of anus, unspecified: Secondary | ICD-10-CM | POA: Diagnosis not present

## 2016-09-13 DIAGNOSIS — F339 Major depressive disorder, recurrent, unspecified: Secondary | ICD-10-CM | POA: Diagnosis not present

## 2016-09-13 DIAGNOSIS — C211 Malignant neoplasm of anal canal: Secondary | ICD-10-CM | POA: Diagnosis not present

## 2016-09-13 DIAGNOSIS — C3491 Malignant neoplasm of unspecified part of right bronchus or lung: Secondary | ICD-10-CM | POA: Diagnosis not present

## 2016-09-13 DIAGNOSIS — F5101 Primary insomnia: Secondary | ICD-10-CM

## 2016-09-13 DIAGNOSIS — C269 Malignant neoplasm of ill-defined sites within the digestive system: Secondary | ICD-10-CM | POA: Diagnosis not present

## 2016-09-13 DIAGNOSIS — Z510 Encounter for antineoplastic radiation therapy: Secondary | ICD-10-CM | POA: Diagnosis not present

## 2016-09-13 DIAGNOSIS — I482 Chronic atrial fibrillation: Secondary | ICD-10-CM | POA: Diagnosis not present

## 2016-09-13 MED ORDER — ZOLPIDEM TARTRATE 5 MG PO TABS: 5.0000 mg | ORAL_TABLET | Freq: Every evening | ORAL | 5 refills | Status: DC | PRN

## 2016-09-13 NOTE — Addendum Note (Signed)
Encounter addended by: Heywood Footman, RN on: 09/13/2016  4:56 PM<BR>    Actions taken: Order list changed, Order Reconciliation Section accessed, Home Medications modified, Medication taking status modified

## 2016-09-13 NOTE — Progress Notes (Signed)
Weight and vitals stable. Denies pain. Continuous oxygen therapy 2 liters via nasal cannula noted. Denies nausea or vomiting. Loose bowel noted in colostomy bag. Stoma of colostomy pink and moist. Yeast infection has greatly improved. Completed course of diflucan. Denies dysuria or hematuria. Denies rectal. Reports increased GERD. Noted script for prilosec has expired at the facility. Reports she continues to supplement her diet with 2-3 ensures per day. Reports difficulty sleeping.             BP (!) 111/59 (BP Location: Right Arm, Patient Position: Sitting, Cuff Size: Large)   Pulse 93   Temp 98.2 F (36.8 C) (Oral)   Resp 18   Wt 234 lb 9.6 oz (106.4 kg)   SpO2 95%   BMI 40.27 kg/m  Wt Readings from Last 3 Encounters:  09/13/16 234 lb 9.6 oz (106.4 kg)  09/05/16 236 lb 3.2 oz (107.1 kg)  08/26/16 243 lb 9.7 oz (110.5 kg)

## 2016-09-13 NOTE — Progress Notes (Signed)
  Radiation Oncology         [phone]   Name: Tricia Hernandez MRN: [id number]   Date: 09/13/2016  DOB: [birth year]   Weekly Radiation Therapy Management    ICD-9-CM ICD-10-CM   1. Primary insomnia 307.42 F51.01 zolpidem (AMBIEN) 5 MG tablet  2. Anal cancer ([place]) 154.3 C21.0     Current Dose: 39.6 Gy  Planned Dose:  54 Gy  Narrative The patient presents for routine under treatment assessment.   Weight and vitals stable. Denies pain. Continuous oxygen therapy 2 liters via nasal cannula noted. Denies nausea or vomiting. Loose bowel noted in colostomy bag. Patient denies diarrhea. Stoma of colostomy pink and moist. Yeast infection has greatly improved. Completed course of diflucan. Denies dysuria or hematuria. Reports increased GERD. Noted script for prilosec has expired at the facility. Reports she continues to supplement her diet with 2-3 Ensures per day. Reports difficulty sleeping. She has never taken medication sleep aid. She is taking prednisone.  Set-up films were reviewed. The chart was checked.  Physical Findings  weight is 234 lb 9.6 oz (106.4 kg). Her oral temperature is 98.2 F (36.8 C). Her blood pressure is 111/59 (abnormal) and her pulse is 93. Her respiration is 18 and oxygen saturation is 95%.  Weight essentially stable.  No significant changes. In wheelchair. Oxygen via nasal cannula.   Impression The patient is tolerating radiation.  Plan Continue treatment as planned. She is scheduled for follow up with Dr. Lindi Adie on 09/17/2016. Prescribed ambien for difficulty sleeping. The patient will also try chamomile tea and melatonin for difficulty sleeping. We wrote a paper order for her to take to the rehab facility to reinstate her medicine.     Sheral Apley [name], M.D.  This document serves as a record of services personally performed by Tyler Pita, MD. It was created on his behalf by Arlyce Harman, a trained medical scribe. The creation of this record is based on the  scribe's personal observations and the provider's statements to them. This document has been checked and approved by the attending provider.

## 2016-09-16 ENCOUNTER — Other Ambulatory Visit: Payer: Self-pay | Admitting: Radiation Oncology

## 2016-09-16 ENCOUNTER — Ambulatory Visit
Admission: RE | Admit: 2016-09-16 | Discharge: 2016-09-16 | Disposition: A | Discharge status: Home / Self Care | Payer: Medicare Other | Source: Ambulatory Visit | Attending: Radiation Oncology | Admitting: Radiation Oncology

## 2016-09-16 ENCOUNTER — Ambulatory Visit (HOSPITAL_COMMUNITY)
Admission: RE | Admit: 2016-09-16 | Discharge: 2016-09-16 | Disposition: A | Discharge status: Home / Self Care | Payer: No Typology Code available for payment source | Source: Ambulatory Visit | Attending: Hematology and Oncology | Admitting: Hematology and Oncology

## 2016-09-16 DIAGNOSIS — Q638 Other specified congenital malformations of kidney: Secondary | ICD-10-CM | POA: Insufficient documentation

## 2016-09-16 DIAGNOSIS — C210 Malignant neoplasm of anus, unspecified: Secondary | ICD-10-CM | POA: Insufficient documentation

## 2016-09-16 DIAGNOSIS — I2510 Atherosclerotic heart disease of native coronary artery without angina pectoris: Secondary | ICD-10-CM | POA: Insufficient documentation

## 2016-09-16 DIAGNOSIS — C211 Malignant neoplasm of anal canal: Secondary | ICD-10-CM | POA: Diagnosis not present

## 2016-09-16 DIAGNOSIS — K5730 Diverticulosis of large intestine without perforation or abscess without bleeding: Secondary | ICD-10-CM | POA: Diagnosis not present

## 2016-09-16 DIAGNOSIS — R918 Other nonspecific abnormal finding of lung field: Secondary | ICD-10-CM | POA: Diagnosis not present

## 2016-09-16 DIAGNOSIS — Z510 Encounter for antineoplastic radiation therapy: Secondary | ICD-10-CM | POA: Diagnosis not present

## 2016-09-16 DIAGNOSIS — R300 Dysuria: Secondary | ICD-10-CM

## 2016-09-16 DIAGNOSIS — I700 Atherosclerosis of aorta: Secondary | ICD-10-CM | POA: Insufficient documentation

## 2016-09-16 LAB — URINALYSIS, MICROSCOPIC - CHCC
Glucose: NEGATIVE mg/dL
KETONES: NEGATIVE mg/dL
Nitrite: POSITIVE
PROTEIN: 300 mg/dL
SPECIFIC GRAVITY, URINE: 1.02 (ref 1.003–1.035)
Urobilinogen, UR: 0.2 mg/dL (ref 0.2–1)
pH: 6 (ref 4.6–8.0)

## 2016-09-16 LAB — GLUCOSE, CAPILLARY: GLUCOSE-CAPILLARY: 90 mg/dL (ref 65–99)

## 2016-09-16 MED ORDER — FLUDEOXYGLUCOSE F - 18 (FDG) INJECTION
11.7700 | Freq: Once | INTRAVENOUS | Status: AC | PRN
  Administered 2016-09-16: 11.77 via INTRAVENOUS

## 2016-09-17 ENCOUNTER — Ambulatory Visit
Admission: RE | Admit: 2016-09-17 | Discharge: 2016-09-17 | Disposition: A | Discharge status: Home / Self Care | Payer: Medicare Other | Source: Ambulatory Visit | Attending: Radiation Oncology | Admitting: Radiation Oncology

## 2016-09-17 ENCOUNTER — Encounter: Payer: Self-pay | Admitting: Radiation Oncology

## 2016-09-17 ENCOUNTER — Encounter: Payer: Self-pay | Admitting: Hematology and Oncology

## 2016-09-17 ENCOUNTER — Other Ambulatory Visit: Payer: Self-pay | Admitting: Radiation Oncology

## 2016-09-17 ENCOUNTER — Ambulatory Visit (HOSPITAL_BASED_OUTPATIENT_CLINIC_OR_DEPARTMENT_OTHER): Payer: Medicare Other | Admitting: Hematology and Oncology

## 2016-09-17 DIAGNOSIS — R031 Nonspecific low blood-pressure reading: Secondary | ICD-10-CM | POA: Diagnosis not present

## 2016-09-17 DIAGNOSIS — C211 Malignant neoplasm of anal canal: Secondary | ICD-10-CM | POA: Diagnosis not present

## 2016-09-17 DIAGNOSIS — C210 Malignant neoplasm of anus, unspecified: Secondary | ICD-10-CM | POA: Diagnosis not present

## 2016-09-17 DIAGNOSIS — I10 Essential (primary) hypertension: Secondary | ICD-10-CM | POA: Diagnosis not present

## 2016-09-17 DIAGNOSIS — Z510 Encounter for antineoplastic radiation therapy: Secondary | ICD-10-CM | POA: Diagnosis not present

## 2016-09-17 NOTE — Assessment & Plan Note (Signed)
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 It is unclear but the inguinal lymphadenopathy and the lung nodulescould be metastatic disease. it could be a stage IIIa versus stage IV.  Current Treatment: XRT to anus S/P diverting colostomy  Lung Nodules: PET/CT scan 09/16/2017: Focal anal activity SUV 13.3, inguinal lymph nodes are smaller (2.4 cm to 1.5 cm) and low-grade metabolic activity, faint nodularity in the lungs similar to prior, significant reduction in perirectal lymph node 9.2 cm to 0.4 cm.  Radiology review: I discussed the images from the recent PET CT scan of the patient. I believe that the effects of radiation are still ongoing. There is no current increase activity in the lungs to suggest lung cancer. These nodules could be related to carcinoid. I recommended continuation of current radiation regimen and completion of treatment for definitive therapy for anal cancer.  Return to clinic in 3 months for follow-up

## 2016-09-17 NOTE — Progress Notes (Signed)
Patient Care Team: Clinton Quant, MD as PCP - General (Internal Medicine)  DIAGNOSIS:  Encounter Diagnosis  Name Primary?  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 Patient is undergoing radiation therapy for anal cancer. She reports that she is having generalized weakness and fatigue. She is having tremors as well as difficulty with ambulation. She is accompanied by her daughter who is trying to move all of the physicians from [place] to [place].  REVIEW OF SYSTEMS:   Constitutional: Denies fevers, chills or abnormal weight loss Eyes: Denies blurriness of vision Ears, nose, mouth, throat, and face: Denies mucositis or sore throat Respiratory: Denies cough, dyspnea or wheezes Cardiovascular: Denies palpitation, chest discomfort Gastrointestinal:  Colostomy Skin: Denies abnormal skin rashes Lymphatics: Denies new lymphadenopathy or easy bruising Neurological:Denies numbness, tingling or new weaknesses Behavioral/Psych: Mood is stable, no new changes  Extremities: No lower extremity edema  All other systems were reviewed with the patient and are negative.  I have reviewed the past medical history, past surgical history, social history and family history with the patient and they are unchanged from previous note.  ALLERGIES:  is allergic to penicillins; aleve [naproxen sodium]; antihistamines, chlorpheniramine-type; aspirin;  ciprofloxacin; codeine; hydrochlorothiazide; rocephin [ceftriaxone sodium in dextrose]; and sulfa antibiotics.  MEDICATIONS:  Current Outpatient Prescriptions  Medication Sig Dispense Refill  . apixaban (ELIQUIS) 5 MG TABS tablet Take 5 mg by mouth 2 (two) times daily.    Marland Kitchen atorvastatin (LIPITOR) 20 MG tablet Take 20 mg by mouth daily.    . budesonide (PULMICORT) 0.25 MG/2ML nebulizer solution Take 2 mLs (0.25 mg total) by nebulization 2 (two) times daily. 60 mL 12  . Calcium 600-200 MG-UNIT tablet Take 1 tablet by mouth daily.    . cholecalciferol (VITAMIN D) 1000 units tablet Take 1,000 Units by mouth daily.    . ciprofloxacin (CIPRO) 500 MG tablet Take 500 mg by mouth 2 (two) times daily. Take one tablet by mouth twice a day for five days.    . ENSURE PLUS (ENSURE PLUS) LIQD Take 237 mLs by mouth 3 (three) times daily between meals.    . fenofibrate 160 MG tablet Take 160 mg by mouth daily.    . ferrous sulfate 325 (65 FE) MG tablet Take 325 mg by mouth 2 (two) times daily with a meal.    . fluticasone (FLONASE) 50 MCG/ACT nasal spray Place 1 spray into both nostrils daily.    . folic acid (FOLVITE) 063 MCG tablet Take 400 mcg by mouth daily.    . hydrocortisone (ANUSOL-HC) 25 MG suppository Place 25 mg rectally at bedtime.     Marland Kitchen ipratropium-albuterol (DUONEB) 0.5-2.5 (3) MG/3ML SOLN Take 3 mLs by nebulization 2 (two) times daily.    . metoprolol (LOPRESSOR) 100 MG tablet Take 100 mg by mouth 2 (two) times daily.    . montelukast (SINGULAIR) 10 MG tablet Take 10  mg by mouth daily.    . Multiple Vitamin (MULTIVITAMIN WITH MINERALS) TABS tablet Take 1 tablet by mouth daily.    Marland Kitchen omeprazole (PRILOSEC) 40 MG capsule Take 40 mg by mouth daily.    Marland Kitchen PARoxetine (PAXIL) 20 MG tablet Take 20 mg by mouth at bedtime.     . predniSONE (STERAPRED UNI-PAK 21 TAB) 10 MG (21) TBPK tablet Take 2 tablets (20 mg total) by mouth daily. 42 tablet 0  . pyridOXINE (VITAMIN B-6) 100 MG tablet Take 100 mg by mouth  daily.    . vitamin B-12 (CYANOCOBALAMIN) 1000 MCG tablet Take 1,000 mcg by mouth daily.    Marland Kitchen zolpidem (AMBIEN) 5 MG tablet Take 1 tablet (5 mg total) by mouth at bedtime as needed for sleep. 20 tablet 5   No current facility-administered medications for this visit.     PHYSICAL EXAMINATION: ECOG PERFORMANCE STATUS: 3 - Symptomatic, >50% confined to bed  Vitals:   09/17/16 1536  BP: 126/78  Pulse: (!) 113  Resp: 18  Temp: 97.7 F (36.5 C)   Filed Weights   09/17/16 1536  Weight: 237 lb 3.2 oz (107.6 kg)    GENERAL:alert, no distress and comfortable SKIN: skin color, texture, turgor are normal, no rashes or significant lesions EYES: normal, Conjunctiva are pink and non-injected, sclera clear OROPHARYNX:no exudate, no erythema and lips, buccal mucosa, and tongue normal  NECK: supple, thyroid normal size, non-tender, without nodularity LYMPH:  no palpable lymphadenopathy in the cervical, axillary or inguinal LUNGS: clear to auscultation and percussion with normal breathing effort HEART: regular rate & rhythm and no murmurs and no lower extremity edema ABDOMEN: Colostomy MUSCULOSKELETAL:no cyanosis of digits and no clubbing  NEURO: alert & oriented x 3 with fluent speech, generalized weakness and tremors EXTREMITIES: No lower extremity edema  LABORATORY DATA:  I have reviewed the data as listed   Chemistry      Component Value Date/Time   NA 136 09/02/2016 0522   K 5.0 09/02/2016 0522   CL 92 (L) 09/02/2016 0522   CO2 38 (H) 09/02/2016 0522   BUN 28 (H) 09/02/2016 0522   CREATININE 0.68 09/02/2016 0522      Component Value Date/Time   CALCIUM 9.2 09/02/2016 0522   ALKPHOS 58 09/02/2016 0522   AST 19 09/02/2016 0522   ALT 17 09/02/2016 0522   BILITOT 0.4 09/02/2016 0522       Lab Results  Component Value Date   WBC 12.1 (H) 09/02/2016   HGB 10.3 (L) 09/02/2016   HCT 33.6 (L) 09/02/2016   MCV 79.8 09/02/2016   PLT 169 09/02/2016   NEUTROABS 11.5 (H)  09/02/2016    ASSESSMENT & PLAN:  Anal cancer ([facility]) 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 It is unclear but the inguinal lymphadenopathy and the lung nodulescould be metastatic disease. it could be a stage IIIa versus stage IV.  Current Treatment: XRT to anus S/P diverting colostomy  Lung Nodules: PET/CT scan 09/16/2017: Focal anal activity SUV 13.3, inguinal lymph nodes are smaller (2.4 cm to 1.5 cm) and low-grade metabolic activity, faint nodularity in the lungs similar to prior, significant reduction in perirectal lymph node 9.2 cm to 0.4 cm.  Radiology review: I discussed the images from the recent PET CT scan of the patient. I believe that the effects of radiation are still ongoing. There is no current increase activity in the lungs to suggest lung cancer. These nodules could be  related to carcinoid. I recommended continuation of current radiation regimen and completion of treatment for definitive therapy for anal cancer.  Return to clinic in 3 months for follow-up with another CT chest abdomen pelvis. If those scans look stable, we can plan to see her with scans every 6 months for 2 years then annually thereafter.   I spent 25 minutes talking to the patient of which more than half was spent in counseling and coordination of care.  Orders Placed This Encounter  Procedures  . CT Abdomen Pelvis W Contrast    Standing Status:   Future    Standing Expiration Date:   09/17/2017    Order Specific Question:   If indicated for the ordered procedure, I authorize the administration of contrast media per Radiology protocol    Answer:   Yes    Order Specific Question:   Reason for Exam (SYMPTOM  OR DIAGNOSIS REQUIRED)    Answer:   Restaging anal cancer and carcinoid tumors lung    Order Specific Question:   Preferred imaging location?    Answer:   [facility]  . CT Chest W Contrast    Standing Status:    Future    Standing Expiration Date:   09/17/2017    Order Specific Question:   If indicated for the ordered procedure, I authorize the administration of contrast media per Radiology protocol    Answer:   Yes    Order Specific Question:   Reason for Exam (SYMPTOM  OR DIAGNOSIS REQUIRED)    Answer:   Restaging anal cancer and carcinoid tumors lung    Order Specific Question:   Preferred imaging location?    Answer:   [facility]   The patient has a good understanding of the overall plan. she agrees with it. she will call with any problems that may develop before the next visit here.   Rulon Eisenmenger, MD 09/17/16

## 2016-09-17 NOTE — Progress Notes (Signed)
She is allergic to Cipro. She lives in a facility. I will have to fax in an order to her facility. Her recall is horrible but, I can bring her around following treatment today and try to figure out what reaction she has to each allergy. Sam

## 2016-09-17 NOTE — Progress Notes (Signed)
Patient has urinary tract infection. Phoned daughter to clarify patient's allergies. Daughter confirms the patient only has an anaphylactic response to penicillin. She explains the anaphylactic response to penicillin her mother had occurred before she Collie Siad) was born. She reports that the other medications "just cause a rash." Noted in patient's medication history she has taken doxycycline, cleocin and vancomycin without incident. Updated Shona Simpson, PA-C on these findings. Bryson Ha provided this RN with a written prescription for Cipro 500 mg. Provided paper script to patient's daughter, Collie Siad.

## 2016-09-18 ENCOUNTER — Ambulatory Visit
Admission: RE | Admit: 2016-09-18 | Discharge: 2016-09-18 | Disposition: A | Discharge status: Home / Self Care | Payer: Medicare Other | Source: Ambulatory Visit | Attending: Radiation Oncology | Admitting: Radiation Oncology

## 2016-09-18 DIAGNOSIS — Z510 Encounter for antineoplastic radiation therapy: Secondary | ICD-10-CM | POA: Diagnosis not present

## 2016-09-18 DIAGNOSIS — C211 Malignant neoplasm of anal canal: Secondary | ICD-10-CM | POA: Diagnosis not present

## 2016-09-18 DIAGNOSIS — C210 Malignant neoplasm of anus, unspecified: Secondary | ICD-10-CM | POA: Diagnosis not present

## 2016-09-18 LAB — URINE CULTURE

## 2016-09-19 ENCOUNTER — Ambulatory Visit
Admission: RE | Admit: 2016-09-19 | Discharge: 2016-09-19 | Disposition: A | Discharge status: Home / Self Care | Payer: Medicare Other | Source: Ambulatory Visit | Attending: Radiation Oncology | Admitting: Radiation Oncology

## 2016-09-19 DIAGNOSIS — C211 Malignant neoplasm of anal canal: Secondary | ICD-10-CM | POA: Diagnosis not present

## 2016-09-19 DIAGNOSIS — C210 Malignant neoplasm of anus, unspecified: Secondary | ICD-10-CM

## 2016-09-19 DIAGNOSIS — Z510 Encounter for antineoplastic radiation therapy: Secondary | ICD-10-CM | POA: Diagnosis not present

## 2016-09-19 MED ORDER — BIAFINE EX EMUL
Freq: Every day | CUTANEOUS | Status: DC
  Administered 2016-09-19: 15:00:00 via TOPICAL

## 2016-09-19 NOTE — Progress Notes (Signed)
Physician order sheet for Biafine and the cream given to patient's son to delivery to the facility of which his mother resides.

## 2016-09-20 ENCOUNTER — Other Ambulatory Visit: Payer: Self-pay | Admitting: Emergency Medicine

## 2016-09-20 ENCOUNTER — Ambulatory Visit
Admission: RE | Admit: 2016-09-20 | Discharge: 2016-09-20 | Disposition: A | Discharge status: Home / Self Care | Payer: Medicare Other | Source: Ambulatory Visit | Attending: Radiation Oncology | Admitting: Radiation Oncology

## 2016-09-20 VITALS — BP 111/74 | HR 71 | Temp 97.5°F | Resp 18 | Wt 239.2 lb

## 2016-09-20 DIAGNOSIS — Z510 Encounter for antineoplastic radiation therapy: Secondary | ICD-10-CM | POA: Diagnosis not present

## 2016-09-20 DIAGNOSIS — C211 Malignant neoplasm of anal canal: Secondary | ICD-10-CM | POA: Diagnosis not present

## 2016-09-20 DIAGNOSIS — I4819 Other persistent atrial fibrillation: Secondary | ICD-10-CM

## 2016-09-20 DIAGNOSIS — C210 Malignant neoplasm of anus, unspecified: Secondary | ICD-10-CM

## 2016-09-20 DIAGNOSIS — I2699 Other pulmonary embolism without acute cor pulmonale: Secondary | ICD-10-CM

## 2016-09-20 DIAGNOSIS — C3491 Malignant neoplasm of unspecified part of right bronchus or lung: Secondary | ICD-10-CM

## 2016-09-20 NOTE — Progress Notes (Addendum)
Weight and vitals stable. Denies pain. Dry desquamation of groin fold noted. Nursing home which patient reside started apply Biafine cream each night before bed then washes it off each morning before radiation therapy. Understands to continue use of Biafine for two weeks following xrt. Patient continues antibiotics prescribed by Shona Simpson, PA-C for UTI. Reports dysuria and urinary frequency are less. Patient denies any allergic reaction to antibiotic prescribed. Denies any bowel complaints. Reports continues weakness. Requires assistance with standing and fatigues easily. One month follow up appointment card given. Completed paperwork to obtain handicap placard for patient given to son.   BP 111/74 (BP Location: Right Wrist, Patient Position: Sitting, Cuff Size: Normal)   Pulse 71   Temp 97.5 F (36.4 C) (Oral)   Resp 18   Wt 239 lb 3.2 oz (108.5 kg)   SpO2 99%   BMI 41.06 kg/m  Wt Readings from Last 3 Encounters:  09/20/16 239 lb 3.2 oz (108.5 kg)  09/17/16 237 lb 3.2 oz (107.6 kg)  09/13/16 234 lb 9.6 oz (106.4 kg)

## 2016-09-20 NOTE — Progress Notes (Signed)
  Radiation Oncology         [phone]   Name: Tricia Hernandez MRN: [id number]   Date: 09/20/2016  DOB: Aug 20, 1930   Weekly Radiation Therapy Management    ICD-9-CM ICD-10-CM   1. Anal cancer ([place]) 154.3 C21.0     Current Dose: 48.6 Gy  Planned Dose:  54 Gy  Narrative The patient presents for routine under treatment assessment.   Weight and vitals stable. Denies pain. Dry desquamation of groin fold noted. Nursing home which patient resides started applying Biafine cream each night before bed then washes it off each morning before radiation therapy. She understands to continue use of Biafine for two weeks following XRT. Patient continues antibiotic prescribed by Shona Simpson, PA-C for UTI. Reports dysuria and urinary frequency are less. Patient denies any allergic reaction to antibiotic prescribed. Denies any bowel complaints. Denies loose stool. Reports continued weakness. Requires assistance with standing and fatigues easily.   Set-up films were reviewed. The chart was checked.  Physical Findings  weight is 239 lb 3.2 oz (108.5 kg). Her oral temperature is 97.5 F (36.4 C). Her blood pressure is 111/74 and her pulse is 71. Her respiration is 18 and oxygen saturation is 99%.  Weight essentially stable.  No significant changes. In wheelchair. Oxygen via nasal cannula.   Impression The patient is tolerating radiation.  Plan Continue treatment as planned. She will complete treatment next week and return for follow up in 1 month.     Sheral Apley [name], M.D.  This document serves as a record of services personally performed by Tyler Pita, MD. It was created on his behalf by Arlyce Harman, a trained medical scribe. The creation of this record is based on the scribe's personal observations and the provider's statements to them. This document has been checked and approved by the attending provider.

## 2016-09-23 ENCOUNTER — Ambulatory Visit
Admission: RE | Admit: 2016-09-23 | Discharge: 2016-09-23 | Disposition: A | Discharge status: Home / Self Care | Payer: No Typology Code available for payment source | Source: Ambulatory Visit | Attending: Radiation Oncology | Admitting: Radiation Oncology

## 2016-09-23 ENCOUNTER — Ambulatory Visit
Admission: RE | Admit: 2016-09-23 | Discharge: 2016-09-23 | Disposition: A | Discharge status: Home / Self Care | Payer: Medicare Other | Source: Ambulatory Visit | Attending: Radiation Oncology | Admitting: Radiation Oncology

## 2016-09-23 ENCOUNTER — Other Ambulatory Visit: Payer: Self-pay | Admitting: *Deleted

## 2016-09-23 ENCOUNTER — Ambulatory Visit: Payer: Medicare Other

## 2016-09-23 DIAGNOSIS — C211 Malignant neoplasm of anal canal: Secondary | ICD-10-CM | POA: Insufficient documentation

## 2016-09-23 DIAGNOSIS — R300 Dysuria: Secondary | ICD-10-CM | POA: Diagnosis not present

## 2016-09-23 DIAGNOSIS — Z510 Encounter for antineoplastic radiation therapy: Secondary | ICD-10-CM | POA: Diagnosis not present

## 2016-09-23 DIAGNOSIS — C210 Malignant neoplasm of anus, unspecified: Secondary | ICD-10-CM | POA: Diagnosis not present

## 2016-09-23 LAB — URINALYSIS, MICROSCOPIC - CHCC
BILIRUBIN (URINE): NEGATIVE
GLUCOSE UR CHCC: NEGATIVE mg/dL
Ketones: NEGATIVE mg/dL
Nitrite: NEGATIVE
PH: 6 (ref 4.6–8.0)
Protein: <30 mg/dL
Specific Gravity, Urine: 1.025 (ref 1.003–1.035)
Urobilinogen, UR: 0.2 mg/dL (ref 0.2–1)

## 2016-09-24 ENCOUNTER — Telehealth: Payer: Self-pay | Admitting: *Deleted

## 2016-09-24 ENCOUNTER — Ambulatory Visit: Payer: Medicare Other

## 2016-09-24 ENCOUNTER — Ambulatory Visit
Admission: RE | Admit: 2016-09-24 | Discharge: 2016-09-24 | Disposition: A | Discharge status: Home / Self Care | Payer: Medicare Other | Source: Ambulatory Visit | Attending: Radiation Oncology | Admitting: Radiation Oncology

## 2016-09-24 ENCOUNTER — Other Ambulatory Visit: Payer: Self-pay | Admitting: Radiation Oncology

## 2016-09-24 DIAGNOSIS — C210 Malignant neoplasm of anus, unspecified: Secondary | ICD-10-CM | POA: Diagnosis not present

## 2016-09-24 DIAGNOSIS — Z510 Encounter for antineoplastic radiation therapy: Secondary | ICD-10-CM | POA: Diagnosis not present

## 2016-09-24 DIAGNOSIS — C211 Malignant neoplasm of anal canal: Secondary | ICD-10-CM | POA: Diagnosis not present

## 2016-09-24 LAB — URINE CULTURE: Organism ID, Bacteria: NO GROWTH

## 2016-09-24 MED ORDER — CIPROFLOXACIN HCL 500 MG PO TABS: 500.0000 mg | ORAL_TABLET | Freq: Two times a day (BID) | ORAL | 0 refills | Status: AC

## 2016-09-24 NOTE — Telephone Encounter (Signed)
Prescription for second round of Cipro given to therapist on L4 to provide to the patient. Patient resides in a facility that require a paper prescription. All prescriptions from radiation oncology in the past have been given to the patient, her son, or her daughter and delivered to the facility. Also, another copy of the patient's one month follow up appointment card was attached to the new script. Patient scheduled to complete treatment tomorrow.

## 2016-09-24 NOTE — Telephone Encounter (Signed)
Called daughter Collie Siad, patient's culture from lab urine yesterday still not back, bu Shona Simpson PA wrote a refill on cipro for 5 more days, can pick up tomorrow ,  rx given to Joaquim Lai RN for the patient  3:19 PM

## 2016-09-25 ENCOUNTER — Encounter: Payer: Self-pay | Admitting: Radiation Oncology

## 2016-09-25 ENCOUNTER — Ambulatory Visit
Admission: RE | Admit: 2016-09-25 | Discharge: 2016-09-25 | Disposition: A | Discharge status: Home / Self Care | Payer: Medicare Other | Source: Ambulatory Visit | Attending: Radiation Oncology | Admitting: Radiation Oncology

## 2016-09-25 ENCOUNTER — Ambulatory Visit: Payer: Medicare Other

## 2016-09-25 DIAGNOSIS — C211 Malignant neoplasm of anal canal: Secondary | ICD-10-CM | POA: Diagnosis not present

## 2016-09-25 DIAGNOSIS — C210 Malignant neoplasm of anus, unspecified: Secondary | ICD-10-CM | POA: Diagnosis not present

## 2016-09-25 DIAGNOSIS — Z510 Encounter for antineoplastic radiation therapy: Secondary | ICD-10-CM | POA: Diagnosis not present

## 2016-09-26 DIAGNOSIS — L98419 Non-pressure chronic ulcer of buttock with unspecified severity: Secondary | ICD-10-CM | POA: Diagnosis not present

## 2016-09-26 NOTE — Progress Notes (Signed)
Thank you. Sam

## 2016-09-27 NOTE — Progress Notes (Signed)
  Radiation Oncology         [phone] ________________________________  Name: Tricia Hernandez MRN: [id number]  Date: 09/25/2016  DOB: Nov 29, 1930  End of Treatment Note  Diagnosis:  83 y.o. 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 The patient was admitted to the ED on 08/30/16 for SOB and chills. It was determined that the patient developed a yeast infection for which she was prescribed antibiotics. The patient had dysuria and urinary frequency that improved. She had blood and black stool that seeped from her rectum during the beginning of treatment that improved. The patient had a colostomy bag in place with normal/loose stool. She had fatigue and difficulty sleeping. She was prescribed Ambien as a sleep aid and advised to use chamomile tea and melatonin. The patient had GERD for which she took Prilosec. The patient had hyperpigmentation below the colostomy back without desquamation.  Plan: The patient has completed radiation treatment. The patient will return to radiation oncology clinic for routine followup in one month. I advised her to call or return sooner if she has any questions or concerns related to her recovery or treatment. ________________________________  Sheral Apley. [name], M.D.  This document serves as a record of services personally performed by Tyler Pita, MD. It was created on his behalf by Darcus Austin, a trained medical scribe. The creation of this record is based on the scribe's personal observations and the provider's statements to them. This document has been checked and approved by the attending  provider.

## 2016-09-30 ENCOUNTER — Encounter (INDEPENDENT_AMBULATORY_CARE_PROVIDER_SITE_OTHER): Payer: Self-pay

## 2016-09-30 ENCOUNTER — Encounter: Payer: Self-pay | Admitting: Internal Medicine

## 2016-09-30 ENCOUNTER — Ambulatory Visit (INDEPENDENT_AMBULATORY_CARE_PROVIDER_SITE_OTHER): Payer: Medicare Other | Admitting: Internal Medicine

## 2016-09-30 VITALS — BP 104/60 | HR 142 | Ht 64.0 in | Wt 241.8 lb

## 2016-09-30 DIAGNOSIS — Z79899 Other long term (current) drug therapy: Secondary | ICD-10-CM

## 2016-09-30 DIAGNOSIS — I4891 Unspecified atrial fibrillation: Secondary | ICD-10-CM | POA: Diagnosis not present

## 2016-09-30 NOTE — Patient Instructions (Addendum)
Medication Instructions:  Your physician recommends that you continue on your current medications as directed. Please refer to the Current Medication list given to you today.   Labwork: BMET 1 week after discharge    Testing/Procedures: Hospital admission for Tikosyn    EKG (nurse visit) 1 week after discharge   Follow-Up: Your physician recommends that you schedule a follow-up appointment in: 6 weeks from hospital discharge with Dr. Lovena Le    Any Other Special Instructions Will Be Listed Below (If Applicable). Our pharmacist will call you to set up appointment to discuss and schedule Tikosyn admission    If you need a refill on your cardiac medications before your next appointment, please call your pharmacy.

## 2016-09-30 NOTE — Progress Notes (Signed)
HPI Tricia Hernandez is referred today for evaluation of uncontrolled atrial fib with a RVR. She has a h/o lung CA, s/p excession, as well as pulmonary embolism. She was diagnosed with anus CA and has undergone radiation but has not yet healed completely. The patient has very rapid rates in atrial fib. She gets sob and tires easily when she exerts herself. Allergies  Allergen Reactions  . Penicillins Anaphylaxis    Reaction:  Unknown  Has patient had a PCN reaction causing immediate rash, facial/tongue/throat swelling, SOB or lightheadedness with hypotension: Unsure Has patient had a PCN reaction causing severe rash involving mucus membranes or skin necrosis: Unsure Has patient had a PCN reaction that required hospitalization Unsure Has patient had a PCN reaction occurring within the last 10 years: Unsure If all of the above answers are "NO", then may proceed with Cephalosporin use.   [name] [Naproxen Sodium] Hives  . Antihistamines, Chlorpheniramine-Type Rash    Reaction:  Unknown   . Aspirin Rash  . Codeine Rash  . Hydrochlorothiazide Rash  . Rocephin [Ceftriaxone Sodium In Dextrose] Rash  . Sulfa Antibiotics Rash     Current Outpatient Prescriptions  Medication Sig Dispense Refill  . apixaban (ELIQUIS) 5 MG TABS tablet Take 5 mg by mouth 2 (two) times daily.    Marland Kitchen atorvastatin (LIPITOR) 20 MG tablet Take 20 mg by mouth daily.    . budesonide (PULMICORT) 0.25 MG/2ML nebulizer solution Take 2 mLs (0.25 mg total) by nebulization 2 (two) times daily. 60 mL 12  . Calcium 600-200 MG-UNIT tablet Take 1 tablet by mouth daily.    . cholecalciferol (VITAMIN D) 1000 units tablet Take 1,000 Units by mouth daily.    Marland Kitchen emollient (BIAFINE) cream Apply topically as needed. Apply cream to area of skin breakdown in groin folds. Please apply at night before bed. Please wash off with AM shower and do not reapply before radiation therapy.    . ENSURE PLUS (ENSURE PLUS) LIQD Take 237 mLs by mouth  3 (three) times daily between meals.    . fenofibrate 160 MG tablet Take 160 mg by mouth daily.    . ferrous sulfate 325 (65 FE) MG tablet Take 325 mg by mouth 2 (two) times daily with a meal.    . fluticasone (FLONASE) 50 MCG/ACT nasal spray Place 1 spray into both nostrils daily.    . folic acid (FOLVITE) 620 MCG tablet Take 400 mcg by mouth daily.    . hydrocortisone (ANUSOL-HC) 25 MG suppository Place 25 mg rectally at bedtime.     Marland Kitchen ipratropium-albuterol (DUONEB) 0.5-2.5 (3) MG/3ML SOLN Take 3 mLs by nebulization 2 (two) times daily.    . metoprolol (LOPRESSOR) 100 MG tablet Take 100 mg by mouth 2 (two) times daily.    . montelukast (SINGULAIR) 10 MG tablet Take 10 mg by mouth daily.    . Multiple Vitamin (MULTIVITAMIN WITH MINERALS) TABS tablet Take 1 tablet by mouth daily.    Marland Kitchen omeprazole (PRILOSEC) 40 MG capsule Take 40 mg by mouth daily.    Marland Kitchen PARoxetine (PAXIL) 20 MG tablet Take 20 mg by mouth at bedtime.     . predniSONE (STERAPRED UNI-PAK 21 TAB) 10 MG (21) TBPK tablet Take 2 tablets (20 mg total) by mouth daily. 42 tablet 0  . pyridOXINE (VITAMIN B-6) 100 MG tablet Take 100 mg by mouth daily.    . vitamin B-12 (CYANOCOBALAMIN) 1000 MCG tablet Take 1,000 mcg by mouth daily.    Marland Kitchen  zolpidem (AMBIEN) 5 MG tablet Take 1 tablet (5 mg total) by mouth at bedtime as needed for sleep. 20 tablet 5   No current facility-administered medications for this visit.      Past Medical History:  Diagnosis Date  . Anxiety   . Atrial fibrillation ([place])   . Chronic bronchitis ([place])   . Chronic depression   . Diverticular disease   . Fibrocystic disease of breast   . Fracture of shoulder   . GERD (gastroesophageal reflux disease)   . Herpes zoster   . Hyperlipidemia   . Hypertension   . Labyrinthitis   . Lung cancer ([place])   . Obesity   . Paroxysmal supraventricular tachycardia ([place])   . Pulmonary embolus ([place])   . Vitamin D deficiency     ROS:   All systems reviewed and negative except  as noted in the HPI.   Past Surgical History:  Procedure Laterality Date  . APPENDECTOMY    . BASAL CELL CARCINOMA EXCISION     nose  . breast cyst removal     right  . CATARACT EXTRACTION Bilateral   . CHOLECYSTECTOMY    . COLOSTOMY N/A 07/29/2016   Procedure: COLOSTOMY;  Surgeon: Clovis Riley, MD;  Location: [facility];  Service: General;  Laterality: N/A;  . EVALUATION UNDER ANESTHESIA WITH HEMORRHOIDECTOMY AND PROCTOSCOPY N/A 07/25/2016   Procedure: EXAM UNDER ANESTHESIA, EXCISION PERIANAL MASS.;  Surgeon: Judeth Horn, MD;  Location: [facility];  Service: General;  Laterality: N/A;  Prone position  . LAPAROSCOPIC DIVERTED COLOSTOMY N/A 07/29/2016   Procedure: ATTEMPTED LAPAROSCOPIC ASSISTED COLOSTOMY;  Surgeon: Clovis Riley, MD;  Location: [facility];  Service: General;  Laterality: N/A;  . LUNG REMOVAL, PARTIAL  2013   RML mass/carcinoid, Dr Lurena Nida  . TONSILLECTOMY AND ADENOIDECTOMY    . TOTAL VAGINAL HYSTERECTOMY       Family History  Problem Relation Age of Onset  . Heart attack Mother     Dec 1987  . CVA Mother   . Hypertension Mother   . Multiple myeloma Mother   . Breast cancer Sister   . Skin cancer Sister   . Prostate cancer Brother   . Throat cancer Brother   . Cervical cancer Daughter   . Leukemia Daughter   . Leukemia Son   . Throat cancer Brother   . Cancer Brother     soft palette  . Heart Problems Brother     Pacemaker  . Colon cancer Neg Hx   . Pancreatic cancer Neg Hx      Social History   Social History  . Marital status: Single    Spouse name: N/A  . Number of children: N/A  . Years of education: N/A   Occupational History  . Not on file.   Social History Main Topics  . Smoking status: Former Smoker    Quit date: 1976  . Smokeless tobacco: Never Used  . Alcohol use No  . Drug use: No  . Sexual activity: Not on file   Other Topics Concern  . Not on file   Social History Narrative  . No narrative on file     BP 104/60   Pulse  (!) 142   Ht 5' 4" (1.626 m)   Wt 241 lb 12.8 oz (109.7 kg)   BMI 41.50 kg/m   Physical Exam:  Well appearing NAD HEENT: Unremarkable Neck:  No JVD, no thyromegally Lymphatics:  No adenopathy Back:  No CVA tenderness Lungs:  Clear HEART:  Regular rate rhythm, no murmurs, no rubs, no clicks Abd:  soft, positive bowel sounds, no organomegally, no rebound, no guarding Ext:  2 plus pulses, 2+ peripheral edema, no cyanosis, no clubbing Skin:  No rashes no nodules Neuro:  CN II through XII intact, motor grossly intact  EKG - atrial fib with a RVR, QTC around 415  Assess/Plan: 1. Uncontrolled atrial fib - we discussed the treatment options. Because she has been out of rhythm for several years, she may well not maintain NSR. We also discussed AV node ablation and insertion of a HIS bundle PPM. With her recent radiation therapy and skin problems in the perineum, this is also not a great choice. Unfortunately she cannot be rate controlled. Will have her come in next week for Dofetilide initiation. 2. Obesity - she is encouraged to lose weight. 3. HTN - her blood pressure has been low.  4. Chronic diastolic heart failure - her symptoms are class 2. She will continue her current meds. She has peripheral edema and will likely need additional diuretic therapy.   ,M.D. 

## 2016-10-01 ENCOUNTER — Ambulatory Visit: Payer: Medicare Other

## 2016-10-02 ENCOUNTER — Telehealth: Payer: Self-pay | Admitting: Pharmacist

## 2016-10-02 DIAGNOSIS — I482 Chronic atrial fibrillation: Secondary | ICD-10-CM | POA: Diagnosis not present

## 2016-10-02 DIAGNOSIS — C3491 Malignant neoplasm of unspecified part of right bronchus or lung: Secondary | ICD-10-CM | POA: Diagnosis not present

## 2016-10-02 DIAGNOSIS — I5032 Chronic diastolic (congestive) heart failure: Secondary | ICD-10-CM | POA: Diagnosis not present

## 2016-10-02 DIAGNOSIS — C210 Malignant neoplasm of anus, unspecified: Secondary | ICD-10-CM | POA: Diagnosis not present

## 2016-10-02 NOTE — Telephone Encounter (Signed)
Spoke to patient's daughter and pt has not missed any doses of Eliquis since her surgery in December. She has been on Paroxetine for several years and has been stable. We discussed that this medication can contribute to prolonged QTc. Looking back at the EKGs in epic her QTc has been stable since December (we do not have record before that). Advised that she could discuss with PCP change to SNRI. Paroxetine is not contraindicated but should be monitored closely. Patient's daughter states understanding and they will discuss with PCP. Appt made for Monday for Tikosyn loading. Pt's daughter will call to verify copay is cost effective.

## 2016-10-03 DIAGNOSIS — L98419 Non-pressure chronic ulcer of buttock with unspecified severity: Secondary | ICD-10-CM | POA: Diagnosis not present

## 2016-10-07 ENCOUNTER — Inpatient Hospital Stay (HOSPITAL_COMMUNITY)
Admission: AD | Admit: 2016-10-07 | Discharge: 2016-10-10 | DRG: 309 | Disposition: A | Discharge status: Home Health | Payer: Medicare Other | Source: Ambulatory Visit | Attending: Internal Medicine | Admitting: Internal Medicine

## 2016-10-07 ENCOUNTER — Telehealth: Payer: Self-pay | Admitting: *Deleted

## 2016-10-07 ENCOUNTER — Ambulatory Visit: Payer: Medicare Other

## 2016-10-07 ENCOUNTER — Ambulatory Visit (INDEPENDENT_AMBULATORY_CARE_PROVIDER_SITE_OTHER): Payer: Medicare Other | Admitting: Pharmacist

## 2016-10-07 VITALS — Wt 242.0 lb

## 2016-10-07 DIAGNOSIS — Z886 Allergy status to analgesic agent status: Secondary | ICD-10-CM | POA: Diagnosis not present

## 2016-10-07 DIAGNOSIS — F419 Anxiety disorder, unspecified: Secondary | ICD-10-CM | POA: Diagnosis not present

## 2016-10-07 DIAGNOSIS — F329 Major depressive disorder, single episode, unspecified: Secondary | ICD-10-CM | POA: Diagnosis not present

## 2016-10-07 DIAGNOSIS — Z85118 Personal history of other malignant neoplasm of bronchus and lung: Secondary | ICD-10-CM

## 2016-10-07 DIAGNOSIS — K219 Gastro-esophageal reflux disease without esophagitis: Secondary | ICD-10-CM | POA: Diagnosis not present

## 2016-10-07 DIAGNOSIS — E559 Vitamin D deficiency, unspecified: Secondary | ICD-10-CM | POA: Diagnosis not present

## 2016-10-07 DIAGNOSIS — Z923 Personal history of irradiation: Secondary | ICD-10-CM | POA: Diagnosis not present

## 2016-10-07 DIAGNOSIS — I10 Essential (primary) hypertension: Secondary | ICD-10-CM | POA: Diagnosis not present

## 2016-10-07 DIAGNOSIS — Z8249 Family history of ischemic heart disease and other diseases of the circulatory system: Secondary | ICD-10-CM

## 2016-10-07 DIAGNOSIS — Z79899 Other long term (current) drug therapy: Secondary | ICD-10-CM

## 2016-10-07 DIAGNOSIS — Z807 Family history of other malignant neoplasms of lymphoid, hematopoietic and related tissues: Secondary | ICD-10-CM | POA: Diagnosis not present

## 2016-10-07 DIAGNOSIS — C210 Malignant neoplasm of anus, unspecified: Secondary | ICD-10-CM | POA: Diagnosis present

## 2016-10-07 DIAGNOSIS — N6019 Diffuse cystic mastopathy of unspecified breast: Secondary | ICD-10-CM | POA: Diagnosis present

## 2016-10-07 DIAGNOSIS — Z882 Allergy status to sulfonamides status: Secondary | ICD-10-CM

## 2016-10-07 DIAGNOSIS — E669 Obesity, unspecified: Secondary | ICD-10-CM | POA: Diagnosis present

## 2016-10-07 DIAGNOSIS — Z7901 Long term (current) use of anticoagulants: Secondary | ICD-10-CM

## 2016-10-07 DIAGNOSIS — Z86711 Personal history of pulmonary embolism: Secondary | ICD-10-CM | POA: Diagnosis not present

## 2016-10-07 DIAGNOSIS — E785 Hyperlipidemia, unspecified: Secondary | ICD-10-CM | POA: Diagnosis present

## 2016-10-07 DIAGNOSIS — Z803 Family history of malignant neoplasm of breast: Secondary | ICD-10-CM

## 2016-10-07 DIAGNOSIS — Z880 Allergy status to penicillin: Secondary | ICD-10-CM

## 2016-10-07 DIAGNOSIS — Z885 Allergy status to narcotic agent status: Secondary | ICD-10-CM

## 2016-10-07 DIAGNOSIS — Z823 Family history of stroke: Secondary | ICD-10-CM | POA: Diagnosis not present

## 2016-10-07 DIAGNOSIS — Z87891 Personal history of nicotine dependence: Secondary | ICD-10-CM | POA: Diagnosis not present

## 2016-10-07 DIAGNOSIS — I471 Supraventricular tachycardia: Secondary | ICD-10-CM | POA: Diagnosis present

## 2016-10-07 DIAGNOSIS — Z808 Family history of malignant neoplasm of other organs or systems: Secondary | ICD-10-CM

## 2016-10-07 DIAGNOSIS — Z6841 Body Mass Index (BMI) 40.0 and over, adult: Secondary | ICD-10-CM

## 2016-10-07 DIAGNOSIS — Z85828 Personal history of other malignant neoplasm of skin: Secondary | ICD-10-CM

## 2016-10-07 DIAGNOSIS — I481 Persistent atrial fibrillation: Principal | ICD-10-CM

## 2016-10-07 DIAGNOSIS — Z7951 Long term (current) use of inhaled steroids: Secondary | ICD-10-CM

## 2016-10-07 DIAGNOSIS — I4819 Other persistent atrial fibrillation: Secondary | ICD-10-CM | POA: Diagnosis present

## 2016-10-07 DIAGNOSIS — Z888 Allergy status to other drugs, medicaments and biological substances status: Secondary | ICD-10-CM

## 2016-10-07 DIAGNOSIS — I4891 Unspecified atrial fibrillation: Secondary | ICD-10-CM | POA: Diagnosis not present

## 2016-10-07 DIAGNOSIS — I493 Ventricular premature depolarization: Secondary | ICD-10-CM | POA: Diagnosis not present

## 2016-10-07 HISTORY — DX: Personal history of other medical treatment: Z92.89

## 2016-10-07 HISTORY — DX: Heart failure, unspecified: I50.9

## 2016-10-07 HISTORY — DX: Gastric ulcer, unspecified as acute or chronic, without hemorrhage or perforation: K25.9

## 2016-10-07 HISTORY — DX: Basal cell carcinoma of skin of nose: C44.311

## 2016-10-07 HISTORY — DX: Colostomy status: Z93.3

## 2016-10-07 HISTORY — DX: Malignant neoplasm of anus, unspecified: C21.0

## 2016-10-07 HISTORY — DX: Other pulmonary embolism without acute cor pulmonale: I26.99

## 2016-10-07 HISTORY — DX: Anemia, unspecified: D64.9

## 2016-10-07 HISTORY — DX: Personal history of other diseases of the digestive system: Z87.19

## 2016-10-07 HISTORY — DX: Pneumonia, unspecified organism: J18.9

## 2016-10-07 LAB — BASIC METABOLIC PANEL
Anion gap: 4 — ABNORMAL LOW (ref 5–15)
BUN / CREAT RATIO: 22 (ref 12–28)
BUN: 20 mg/dL (ref 8–27)
BUN: 16 mg/dL (ref 6–20)
CALCIUM: 8.2 mg/dL — AB (ref 8.9–10.3)
CHLORIDE: 100 mmol/L (ref 96–106)
CO2: 33 mmol/L — ABNORMAL HIGH (ref 18–29)
CO2: 32 mmol/L (ref 22–32)
CREATININE: 0.86 mg/dL (ref 0.44–1.00)
Calcium: 8.7 mg/dL (ref 8.7–10.3)
Chloride: 101 mmol/L (ref 101–111)
Creatinine, Ser: 0.91 mg/dL (ref 0.57–1.00)
GFR calc non Af Amer: 59 mL/min/{1.73_m2} — ABNORMAL LOW (ref >59)
GFR calc non Af Amer: >60 mL/min (ref >60)
GFR, EST AFRICAN AMERICAN: 67 mL/min/{1.73_m2} (ref >59)
Glucose, Bld: 136 mg/dL — ABNORMAL HIGH (ref 65–99)
Glucose: 93 mg/dL (ref 65–99)
Potassium: 3.8 mmol/L (ref 3.5–5.2)
Potassium: 4.8 mmol/L (ref 3.5–5.1)
SODIUM: 135 mmol/L (ref 134–144)
Sodium: 137 mmol/L (ref 135–145)

## 2016-10-07 LAB — MAGNESIUM
Magnesium: 1.7 mg/dL (ref 1.6–2.3)
Magnesium: 2.1 mg/dL (ref 1.7–2.4)

## 2016-10-07 MED ORDER — VITAMIN D 1000 UNITS PO TABS
1000.0000 [IU] | ORAL_TABLET | Freq: Every day | ORAL | Status: DC
  Administered 2016-10-08 – 2016-10-10 (×3): 1000 [IU] via ORAL
  Filled 2016-10-07 (×3): qty 1

## 2016-10-07 MED ORDER — SODIUM CHLORIDE 0.9% FLUSH
3.0000 mL | Freq: Two times a day (BID) | INTRAVENOUS | Status: DC
  Administered 2016-10-07 – 2016-10-10 (×7): 3 mL via INTRAVENOUS

## 2016-10-07 MED ORDER — MAGNESIUM OXIDE 400 (241.3 MG) MG PO TABS: 400.0000 mg | ORAL_TABLET | Freq: Two times a day (BID) | ORAL | Status: DC

## 2016-10-07 MED ORDER — MONTELUKAST SODIUM 10 MG PO TABS
10.0000 mg | ORAL_TABLET | Freq: Every day | ORAL | Status: DC
  Administered 2016-10-08 – 2016-10-10 (×3): 10 mg via ORAL
  Filled 2016-10-07 (×3): qty 1

## 2016-10-07 MED ORDER — ADULT MULTIVITAMIN W/MINERALS CH
1.0000 | ORAL_TABLET | Freq: Every day | ORAL | Status: DC
  Administered 2016-10-08 – 2016-10-10 (×3): 1 via ORAL
  Filled 2016-10-07 (×3): qty 1

## 2016-10-07 MED ORDER — ZOLPIDEM TARTRATE 5 MG PO TABS
5.0000 mg | ORAL_TABLET | Freq: Every evening | ORAL | Status: DC | PRN
Start: 2016-10-07 — End: 2016-10-10

## 2016-10-07 MED ORDER — ATORVASTATIN CALCIUM 20 MG PO TABS
20.0000 mg | ORAL_TABLET | Freq: Every day | ORAL | Status: DC
  Administered 2016-10-07 – 2016-10-10 (×4): 20 mg via ORAL
  Filled 2016-10-07 (×4): qty 1

## 2016-10-07 MED ORDER — FENOFIBRATE 160 MG PO TABS
160.0000 mg | ORAL_TABLET | Freq: Every day | ORAL | Status: DC
  Administered 2016-10-08 – 2016-10-10 (×3): 160 mg via ORAL
  Filled 2016-10-07 (×3): qty 1

## 2016-10-07 MED ORDER — SODIUM CHLORIDE 0.9% FLUSH
3.0000 mL | INTRAVENOUS | Status: DC | PRN
  Administered 2016-10-08: 3 mL via INTRAVENOUS
  Filled 2016-10-07: qty 3

## 2016-10-07 MED ORDER — APIXABAN 5 MG PO TABS
5.0000 mg | ORAL_TABLET | Freq: Two times a day (BID) | ORAL | Status: DC
  Administered 2016-10-07 – 2016-10-10 (×6): 5 mg via ORAL
  Filled 2016-10-07 (×6): qty 1

## 2016-10-07 MED ORDER — SODIUM CHLORIDE 0.9 % IV SOLN: 250.0000 mL | INTRAVENOUS | Status: DC | PRN

## 2016-10-07 MED ORDER — PANTOPRAZOLE SODIUM 40 MG PO TBEC
40.0000 mg | DELAYED_RELEASE_TABLET | Freq: Every day | ORAL | Status: DC
  Administered 2016-10-08 – 2016-10-10 (×3): 40 mg via ORAL
  Filled 2016-10-07 (×3): qty 1

## 2016-10-07 MED ORDER — BUDESONIDE 0.25 MG/2ML IN SUSP
0.2500 mg | Freq: Two times a day (BID) | RESPIRATORY_TRACT | Status: DC
  Administered 2016-10-07 – 2016-10-10 (×5): 0.25 mg via RESPIRATORY_TRACT
  Filled 2016-10-07 (×5): qty 2

## 2016-10-07 MED ORDER — DOFETILIDE 500 MCG PO CAPS
500.0000 ug | ORAL_CAPSULE | Freq: Two times a day (BID) | ORAL | Status: DC
  Administered 2016-10-07 – 2016-10-10 (×6): 500 ug via ORAL
  Filled 2016-10-07 (×6): qty 1

## 2016-10-07 MED ORDER — VITAMIN B-6 100 MG PO TABS
100.0000 mg | ORAL_TABLET | Freq: Every day | ORAL | Status: DC
Start: 2016-10-08 — End: 2016-10-10
  Administered 2016-10-08 – 2016-10-10 (×3): 100 mg via ORAL
  Filled 2016-10-07 (×3): qty 1

## 2016-10-07 MED ORDER — FERROUS SULFATE 325 (65 FE) MG PO TABS
325.0000 mg | ORAL_TABLET | Freq: Two times a day (BID) | ORAL | Status: DC
Start: 2016-10-07 — End: 2016-10-10
  Administered 2016-10-07 – 2016-10-10 (×6): 325 mg via ORAL
  Filled 2016-10-07 (×6): qty 1

## 2016-10-07 MED ORDER — PAROXETINE HCL 20 MG PO TABS
20.0000 mg | ORAL_TABLET | Freq: Every day | ORAL | Status: DC
  Administered 2016-10-07 – 2016-10-09 (×3): 20 mg via ORAL
  Filled 2016-10-07 (×3): qty 1

## 2016-10-07 MED ORDER — MAGNESIUM SULFATE 2 GM/50ML IV SOLN
2.0000 g | Freq: Once | INTRAVENOUS | Status: AC
  Administered 2016-10-07: 2 g via INTRAVENOUS
  Filled 2016-10-07: qty 50

## 2016-10-07 MED ORDER — FLUTICASONE PROPIONATE 50 MCG/ACT NA SUSP
1.0000 | Freq: Every day | NASAL | Status: DC
  Administered 2016-10-08 – 2016-10-10 (×3): 1 via NASAL
  Filled 2016-10-07: qty 16

## 2016-10-07 MED ORDER — MAGNESIUM OXIDE 400 (241.3 MG) MG PO TABS
400.0000 mg | ORAL_TABLET | Freq: Two times a day (BID) | ORAL | Status: DC
  Administered 2016-10-08 – 2016-10-10 (×5): 400 mg via ORAL
  Filled 2016-10-07 (×5): qty 1

## 2016-10-07 MED ORDER — POTASSIUM CHLORIDE CRYS ER 20 MEQ PO TBCR
40.0000 meq | EXTENDED_RELEASE_TABLET | Freq: Once | ORAL | Status: AC
  Administered 2016-10-07: 40 meq via ORAL
  Filled 2016-10-07: qty 2

## 2016-10-07 MED ORDER — HYDROCORTISONE ACETATE 25 MG RE SUPP
25.0000 mg | Freq: Every day | RECTAL | Status: DC
  Filled 2016-10-07 (×3): qty 1

## 2016-10-07 MED ORDER — VITAMIN B-12 1000 MCG PO TABS
1000.0000 ug | ORAL_TABLET | Freq: Every day | ORAL | Status: DC
  Administered 2016-10-08 – 2016-10-10 (×3): 1000 ug via ORAL
  Filled 2016-10-07 (×3): qty 1

## 2016-10-07 MED ORDER — IPRATROPIUM-ALBUTEROL 0.5-2.5 (3) MG/3ML IN SOLN
3.0000 mL | Freq: Two times a day (BID) | RESPIRATORY_TRACT | Status: DC
  Administered 2016-10-07 – 2016-10-10 (×5): 3 mL via RESPIRATORY_TRACT
  Filled 2016-10-07 (×5): qty 3

## 2016-10-07 NOTE — Progress Notes (Signed)
EKG completed and in chart  Tricia Hernandez Tricia Hernandez

## 2016-10-07 NOTE — Progress Notes (Signed)
Patient ID: Tricia Hernandez                 DOB: 1930/12/22                    MRN: [id number]     HPI: Tricia Hernandez is a 83 y.o. female patient of Dr. Lovena Le who presents today for Tikosyn initiation. PMH is significant for uncontrolled afib with RVR, HFpEF with LVEF 60-65^, GERD, HLD, obesity, lung cancer s/p excision, and PE. At last visit with Dr Lovena Le, discussed AV node ablation vs Tikosyn and pt elected to pursue Tikosyn initiation.  Pt educated on potential risks with Tikosyn including QTc prolongation. Pt is aware of the importance of not missing any doses and will call the office if they miss more than 2 doses in a row. Pt is anticoagulated with Eliquis and reports no missed doses within the past month. Pt is currently not taking any contraindicating medications. Pt does take paroxetine - has been on this for several years. EKGs back to December 2017 are stable (no records before that). Not an absolute contraindication with Tikosyn but will monitor QTc closely. No other QTc prolongating or contraindicated medications.   Pt's daughter states that the nursing home stopped giving her any metoprolol '100mg'$  BID because her BP was dropping (systolic in the 02I - normal is low 100s). She has not been taking her metoprolol for the past week and a half minimum. Pt has had swelling in her feet and cheeks/neck since stopping the metoprolol. Discharged from the nursing home today - will go home after Tikosyn admit. Of note, most info discussed with pt's daughter since pt did not seem to comprehend a majority of the information.  EKG: Reviewed by Dr Rayann Heman. Afib with RVR. Vent rate 160bpm, QTc 482mec.  Labs: K 3.8, Mg 1.7, SCr 0.91, CrCl 867mmin, 242 lbs  Past Medical History:  Diagnosis Date  . Anxiety   . Atrial fibrillation ([facility]  . Chronic bronchitis ([place]  . Chronic depression   . Diverticular disease   . Fibrocystic disease of breast   . Fracture of shoulder   . GERD (gastroesophageal  reflux disease)   . Herpes zoster   . Hyperlipidemia   . Hypertension   . Labyrinthitis   . Lung cancer ([facility] [place]  . Obesity   . Paroxysmal supraventricular tachycardia ([facility]  . Pul[place]us ([place]  . Vitamin D deficiency     Current Outpatient Prescriptions on File Prior to Visit  Medication Sig Dispense Refill  . apixaban (ELIQUIS) 5 MG TABS tablet Take 5 mg by mouth 2 (two) times daily.    . Marland Kitchentorvastatin (LIPITOR) 20 MG tablet Take 20 mg by mouth daily.    . budesonide (PULMICORT) 0.25 MG/2ML nebulizer solution Take 2 mLs (0.25 mg total) by nebulization 2 (two) times daily. 60 mL 12  . Calcium 600-200 MG-UNIT tablet Take 1 tablet by mouth daily.    . cholecalciferol (VITAMIN D) 1000 units tablet Take 1,000 Units by mouth daily.    . Marland Kitchenmollient (BIAFINE) cream Apply topically as needed. Apply cream to area of skin breakdown in groin folds. Please apply at night before bed. Please wash off with AM shower and do not reapply before radiation therapy.    . ENSURE PLUS (ENSURE PLUS) LIQD Take 237 mLs by mouth 3 (three) times daily between meals.    . fenofibrate 160 MG tablet Take 160 mg by mouth daily.    .Marland Kitchen  ferrous sulfate 325 (65 FE) MG tablet Take 325 mg by mouth 2 (two) times daily with a meal.    . fluticasone (FLONASE) 50 MCG/ACT nasal spray Place 1 spray into both nostrils daily.    . folic acid (FOLVITE) 532 MCG tablet Take 400 mcg by mouth daily.    . hydrocortisone (ANUSOL-HC) 25 MG suppository Place 25 mg rectally at bedtime.     Marland Kitchen ipratropium-albuterol (DUONEB) 0.5-2.5 (3) MG/3ML SOLN Take 3 mLs by nebulization 2 (two) times daily.    . metoprolol (LOPRESSOR) 100 MG tablet Take 100 mg by mouth 2 (two) times daily.    . montelukast (SINGULAIR) 10 MG tablet Take 10 mg by mouth daily.    . Multiple Vitamin (MULTIVITAMIN WITH MINERALS) TABS tablet Take 1 tablet by mouth daily.    Marland Kitchen omeprazole (PRILOSEC) 40 MG capsule Take 40 mg by mouth daily.    Marland Kitchen PARoxetine (PAXIL) 20 MG  tablet Take 20 mg by mouth at bedtime.     . pyridOXINE (VITAMIN B-6) 100 MG tablet Take 100 mg by mouth daily.    . vitamin B-12 (CYANOCOBALAMIN) 1000 MCG tablet Take 1,000 mcg by mouth daily.    Marland Kitchen zolpidem (AMBIEN) 5 MG tablet Take 1 tablet (5 mg total) by mouth at bedtime as needed for sleep. 20 tablet 5   No current facility-administered medications on file prior to visit.     Allergies  Allergen Reactions  . Penicillins Anaphylaxis    Reaction:  Unknown  Has patient had a PCN reaction causing immediate rash, facial/tongue/throat swelling, SOB or lightheadedness with hypotension: Unsure Has patient had a PCN reaction causing severe rash involving mucus membranes or skin necrosis: Unsure Has patient had a PCN reaction that required hospitalization Unsure Has patient had a PCN reaction occurring within the last 10 years: Unsure If all of the above answers are "NO", then may proceed with Cephalosporin use.   [name] [Naproxen Sodium] Hives  . Antihistamines, Chlorpheniramine-Type Rash    Reaction:  Unknown   . Aspirin Rash  . Codeine Rash  . Hydrochlorothiazide Rash  . Rocephin [Ceftriaxone Sodium In Dextrose] Rash  . Sulfa Antibiotics Rash    Assessment/Plan:  1. Atrial fibrillation - QTc at goal < 42mec and has been stable - of note, pt is on paroxetine which is not contraindicated with Tikosyn but may prolong QTc. Previous EKGs have been stable and pt has been on same dose of paroxetine for years. Of note, her nursing home stopped her metoprolol tartrate '100mg'$  BID a week and a half ago due to hypotension with systolic BP in the 999M Her HR is 160 today as a result.  K somehow dropped to 3.8 despite being close to 5 on previous multiple checks, Mg also dropped to 1.7 despite previously being at goal. Pt and her daughter are already waiting at the hospital by the time labs came back [place] Called [place]'s daughter and advised her to give pt 1-2  bananas to help bring up electrolytes. CrCl > 80, anticipated starting dose of Tikosyn is 507m BID.  Pt needs EKG 1 week after Tikosyn discharge and f/u visit with Dr TaLovena Le weeks after discharge per Dr TaLovena Le  Megan E. Supple, PharmD, CPP, [id number]. [id number] Eagle LaneGrBatesville[place] [id number]: [phone]: [phone]/26/2018 7:39 AM

## 2016-10-07 NOTE — H&P (Signed)
ELECTROPHYSIOLOGY HISTORY AND PHYSICAL    Patient ID: Tricia Hernandez MRN: [id number], DOB/AGE: [birth year] 83 y.o.  Admit date: 10/07/2016 Date of Consult: 10/07/2016  Primary Physician: Wilfred Lacy, NP Electrophysiologist: Lovena Le  CC: here for Tikosyn load   HPI:  Tricia Hernandez is a 84 y.o. female with a past medical history significant for persistent atrial fibrillation, hypertension, depression, prior lung cancer, anal cancer s/p radiation. She has persistent atrial fibrillation despite current medical therapy.  She reports compliance with Eliquis for the last 4 weeks without missed doses.  She presents today for Tikosyn admission.   She denies recent chest pain, shortness of breath, LE edema, nausea, vomiting, dizziness, or syncope.   Past Medical History:  Diagnosis Date  . Anxiety   . Atrial fibrillation ([place])   . Chronic bronchitis ([place])   . Chronic depression   . Diverticular disease   . Fibrocystic disease of breast   . Fracture of shoulder   . GERD (gastroesophageal reflux disease)   . Herpes zoster   . Hyperlipidemia   . Hypertension   . Labyrinthitis   . Lung cancer ([place])   . Obesity   . Paroxysmal supraventricular tachycardia ([place])   . Pulmonary embolus ([place])   . Vitamin D deficiency      Surgical History:  Past Surgical History:  Procedure Laterality Date  . APPENDECTOMY    . BASAL CELL CARCINOMA EXCISION     nose  . breast cyst removal     right  . CATARACT EXTRACTION Bilateral   . CHOLECYSTECTOMY    . COLOSTOMY N/A 07/29/2016   Procedure: COLOSTOMY;  Surgeon: Clovis Riley, MD;  Location: [facility];  Service: General;  Laterality: N/A;  . EVALUATION UNDER ANESTHESIA WITH HEMORRHOIDECTOMY AND PROCTOSCOPY N/A 07/25/2016   Procedure: EXAM UNDER ANESTHESIA, EXCISION PERIANAL MASS.;  Surgeon: Judeth Horn, MD;  Location: [facility];  Service: General;  Laterality: N/A;  Prone position  . LAPAROSCOPIC DIVERTED COLOSTOMY N/A 07/29/2016   Procedure: ATTEMPTED  LAPAROSCOPIC ASSISTED COLOSTOMY;  Surgeon: Clovis Riley, MD;  Location: [facility];  Service: General;  Laterality: N/A;  . LUNG REMOVAL, PARTIAL  2013   RML mass/carcinoid, Dr Lurena Nida  . TONSILLECTOMY AND ADENOIDECTOMY    . TOTAL VAGINAL HYSTERECTOMY       Prescriptions Prior to Admission  Medication Sig Dispense Refill Last Dose  . apixaban (ELIQUIS) 5 MG TABS tablet Take 5 mg by mouth 2 (two) times daily.   Taking  . atorvastatin (LIPITOR) 20 MG tablet Take 20 mg by mouth daily.   Taking  . budesonide (PULMICORT) 0.25 MG/2ML nebulizer solution Take 2 mLs (0.25 mg total) by nebulization 2 (two) times daily. 60 mL 12 Taking  . Calcium 600-200 MG-UNIT tablet Take 1 tablet by mouth daily.   Taking  . cholecalciferol (VITAMIN D) 1000 units tablet Take 1,000 Units by mouth daily.   Taking  . emollient (BIAFINE) cream Apply topically as needed. Apply cream to area of skin breakdown in groin folds. Please apply at night before bed. Please wash off with AM shower and do not reapply before radiation therapy.   Taking  . ENSURE PLUS (ENSURE PLUS) LIQD Take 237 mLs by mouth 3 (three) times daily between meals.   Taking  . fenofibrate 160 MG tablet Take 160 mg by mouth daily.   Taking  . ferrous sulfate 325 (65 FE) MG tablet Take 325 mg by mouth 2 (two) times daily with a meal.   Taking  . fluticasone (  FLONASE) 50 MCG/ACT nasal spray Place 1 spray into both nostrils daily.   Taking  . folic acid (FOLVITE) 417 MCG tablet Take 400 mcg by mouth daily.   Taking  . hydrocortisone (ANUSOL-HC) 25 MG suppository Place 25 mg rectally at bedtime.    Taking  . ipratropium-albuterol (DUONEB) 0.5-2.5 (3) MG/3ML SOLN Take 3 mLs by nebulization 2 (two) times daily.   Taking  . montelukast (SINGULAIR) 10 MG tablet Take 10 mg by mouth daily.   Taking  . Multiple Vitamin (MULTIVITAMIN WITH MINERALS) TABS tablet Take 1 tablet by mouth daily.   Taking  . omeprazole (PRILOSEC) 40 MG capsule Take 40 mg by mouth daily.    Taking  . PARoxetine (PAXIL) 20 MG tablet Take 20 mg by mouth at bedtime.    Taking  . pyridOXINE (VITAMIN B-6) 100 MG tablet Take 100 mg by mouth daily.   Taking  . vitamin B-12 (CYANOCOBALAMIN) 1000 MCG tablet Take 1,000 mcg by mouth daily.   Taking  . zolpidem (AMBIEN) 5 MG tablet Take 1 tablet (5 mg total) by mouth at bedtime as needed for sleep. 20 tablet 5 Taking    Inpatient Medications:   Allergies:  Allergies  Allergen Reactions  . Penicillins Anaphylaxis    Reaction:  Unknown  Has patient had a PCN reaction causing immediate rash, facial/tongue/throat swelling, SOB or lightheadedness with hypotension: Unsure Has patient had a PCN reaction causing severe rash involving mucus membranes or skin necrosis: Unsure Has patient had a PCN reaction that required hospitalization Unsure Has patient had a PCN reaction occurring within the last 10 years: Unsure If all of the above answers are "NO", then may proceed with Cephalosporin use.   Tricia Hernandez [Naproxen Sodium] Hives  . Antihistamines, Chlorpheniramine-Type Rash    Reaction:  Unknown   . Aspirin Rash  . Codeine Rash  . Hydrochlorothiazide Rash  . Rocephin [Ceftriaxone Sodium In Dextrose] Rash  . Sulfa Antibiotics Rash    Social History   Social History  . Marital status: Single    Spouse name: N/A  . Number of children: N/A  . Years of education: N/A   Occupational History  . Not on file.   Social History Main Topics  . Smoking status: Former Smoker    Quit date: 1976  . Smokeless tobacco: Never Used  . Alcohol use No  . Drug use: No  . Sexual activity: Not on file   Other Topics Concern  . Not on file   Social History Narrative  . No narrative on file     Family History  Problem Relation Age of Onset  . Heart attack Mother     Dec 1987  . CVA Mother   . Hypertension Mother   . Multiple myeloma Mother   . Breast cancer Sister   . Skin cancer Sister   . Prostate cancer Brother   . Throat cancer  Brother   . Cervical cancer Daughter   . Leukemia Daughter   . Leukemia Son   . Throat cancer Brother   . Cancer Brother     soft palette  . Heart Problems Brother     Pacemaker  . Colon cancer Neg Hx   . Pancreatic cancer Neg Hx      Review of Systems: All other systems reviewed and are otherwise negative except as noted above.  Physical Exam: Vitals:   10/07/16 1251 10/07/16 1300  BP:  (!) 142/91  Pulse:  94  Resp:  19  Temp:  97.7 F (36.5 C)  TempSrc:  Oral  SpO2:  100%  Weight: 235 lb 6.4 oz (106.8 kg)   Height: 5' 4"  (1.626 m)     GEN- The patient is elderly and obese appearing, alert and oriented x 3 today, +HOH HEENT: normocephalic, atraumatic; sclera clear, conjunctiva pink; hearing intact; oropharynx clear; neck supple  Lungs- Clear to ausculation bilaterally, normal work of breathing.  No wheezes, rales, rhonchi Heart- Tachycardic irregular rate and rhythm  GI- soft, non-tender, non-distended, bowel sounds present  Extremities- no clubbing, cyanosis, 1+ BLE MS- no significant deformity or atrophy Skin- warm and dry, no rash or lesion Psych- euthymic mood, full affect Neuro- strength and sensation are intact  Labs:   Lab Results  Component Value Date   WBC 12.1 (H) 09/02/2016   HGB 10.3 (L) 09/02/2016   HCT 33.6 (L) 09/02/2016   MCV 79.8 09/02/2016   PLT 169 09/02/2016     Recent Labs Lab 10/07/16 1101  NA 135  K 3.8  CL 100  CO2 33*  BUN 20  CREATININE 0.91  CALCIUM 8.7  GLUCOSE 93     TELEMETRY:  AF with RVR   Assessment/Plan: 1.  Persistent symptomatic AF The patient presents for Tikosyn load  K and Mg supplemented QTc stable Tricia Hernandez plan to start Tikosyn 581mg tonight [facility] If still in AF on Wednesday, Tyquavious Gamel need DCCV  2.  Obesity Body mass index is 40.41 kg/m. Weight loss advised  3.  HTN Stable No change required today  Signed, AChanetta Marshall NP 10/07/2016 3:18 PM  I have  seen and examined this patient with AChanetta Marshall  Agree with above, note added to reflect my findings.  On exam, irregular rhythm, no murmurs, lungs clear. Admitted for tikosyn loading. K and Mg to be checked daily. Plan for cardioversion after 4th dose if not back in sinus rhythm.     Kaitlin Ardito M. Duy Lemming MD 10/07/2016 3:49 PM

## 2016-10-07 NOTE — Telephone Encounter (Signed)
Called patient to alter 11-12-16 fu to 10-29-16 @ 2:30 pm with the PA, lvm for a return call

## 2016-10-07 NOTE — Progress Notes (Signed)
Paged MD regarding pt arrival and needing orders. Awaiting orders  Tricia Hernandez

## 2016-10-07 NOTE — Progress Notes (Signed)
Daughter at bedside, stated cardiologist came to bedside and discussed Tikosyn dosing. Pt now resting in bed   Jannetta Massey Leory Plowman

## 2016-10-07 NOTE — Progress Notes (Signed)
Pharmacy Review for Dofetilide (Tikosyn) Initiation  Admit Complaint: 83 y.o. female admitted 10/07/2016 with atrial fibrillation to be initiated on dofetilide.   Assessment:  Patient Exclusion Criteria: If any screening criteria checked as "Yes", then  patient  should NOT receive dofetilide until criteria item is corrected. If "Yes" please indicate correction plan.  YES  NO Patient  Exclusion Criteria Correction Plan  '[]'$  '[x]'$  Baseline QTc interval is greater than or equal to 440 msec. IF above YES box checked dofetilide contraindicated unless patient has ICD; then may proceed if QTc 500-550 msec or with known ventricular conduction abnormalities may proceed with QTc 550-600 msec. QTc =     '[x]'$  '[]'$  Magnesium level is less than 1.8 mEq/l : Last magnesium:  Lab Results  Component Value Date   MG 1.7 10/07/2016       replacing  '[x]'$  '[]'$  Potassium level is less than 4 mEq/l : Last potassium:  Lab Results  Component Value Date   K 3.8 10/07/2016       replacing  '[]'$  '[x]'$  Patient is known or suspected to have a digoxin level greater than 2 ng/ml: No results found for: DIGOXIN    '[]'$  '[x]'$  Creatinine clearance less than 20 ml/min (calculated using Cockcroft-Gault, actual body weight and serum creatinine): Estimated Creatinine Clearance: 55.8 mL/min (by C-G formula based on SCr of 0.91 mg/dL).    '[]'$  '[x]'$  Patient has received drugs known to prolong the QT intervals within the last 48 hours (phenothiazines, tricyclics or tetracyclic antidepressants, erythromycin, H-1 antihistamines, cisapride, fluoroquinolones, azithromycin). Drugs not listed above may have an, as yet, undetected potential to prolong the QT interval, updated information on QT prolonging agents is available at this website:QT prolonging agents   '[]'$  '[x]'$  Patient received a dose of hydrochlorothiazide (Oretic) alone or in any combination including triamterene (Dyazide, Maxzide) in the last 48 hours.   '[]'$  '[x]'$  Patient received a medication  known to increase dofetilide plasma concentrations prior to initial dofetilide dose:  . Trimethoprim (Primsol, Proloprim) in the last 36 hours . Verapamil (Calan, Verelan) in the last 36 hours or a sustained release dose in the last 72 hours . Megestrol (Megace) in the last 5 days  . Cimetidine (Tagamet) in the last 6 hours . Ketoconazole (Nizoral) in the last 24 hours . Itraconazole (Sporanox) in the last 48 hours  . Prochlorperazine (Compazine) in the last 36 hours    '[]'$  '[x]'$  Patient is known to have a history of torsades de pointes; congenital or acquired long QT syndromes.   '[]'$  '[x]'$  Patient has received a Class 1 antiarrhythmic with less than 2 half-lives since last dose. (Disopyramide, Quinidine, Procainamide, Lidocaine, Mexiletine, Flecainide, Propafenone)   '[]'$  '[x]'$  Patient has received amiodarone therapy in the past 3 months or amiodarone level is greater than 0.3 ng/ml.    Patient has been appropriately anticoagulated with apixaban.  Ordering provider was confirmed at [email or web address] if they are not listed on the [place] Prescribers list.  Goal of Therapy: Follow renal function, electrolytes, potential drug interactions, and dose adjustment. Provide education and 1 week supply at discharge.  Plan:  '[x]'$   Physician selected initial dose within range recommended for patients level of renal function - will monitor for response.  '[]'$   Physician selected initial dose outside of range recommended for patients level of renal function - will discuss if the dose should be altered at this time.   Select One Calculated CrCl  Dose q12h  '[x]'$  > 60 ml/min 500 mcg  '[]'$   40-60 ml/min 250 mcg  '[]'$  20-40 ml/min 125 mcg   2. Follow up QTc after the first 5 doses, renal function, electrolytes (K & Mg) daily x 3     days, dose adjustment, success of initiation and facilitate 1 week discharge supply as     clinically indicated.  3. Initiate Tikosyn education video (Call [phone] and ask for  Tikosyn Video # [phone]).  4. Place Enrollment Form on the chart for discharge supply of dofetilide.   Sharilyn Sites M 2:06 PM 10/07/2016

## 2016-10-08 ENCOUNTER — Encounter (HOSPITAL_COMMUNITY): Payer: Self-pay | Admitting: General Practice

## 2016-10-08 LAB — BASIC METABOLIC PANEL
ANION GAP: 6 (ref 5–15)
BUN: 17 mg/dL (ref 6–20)
CHLORIDE: 99 mmol/L — AB (ref 101–111)
CO2: 32 mmol/L (ref 22–32)
Calcium: 8 mg/dL — ABNORMAL LOW (ref 8.9–10.3)
Creatinine, Ser: 0.81 mg/dL (ref 0.44–1.00)
GFR calc Af Amer: >60 mL/min (ref >60)
Glucose, Bld: 102 mg/dL — ABNORMAL HIGH (ref 65–99)
POTASSIUM: 4.8 mmol/L (ref 3.5–5.1)
SODIUM: 137 mmol/L (ref 135–145)

## 2016-10-08 MED ORDER — METOPROLOL TARTRATE 50 MG PO TABS
50.0000 mg | ORAL_TABLET | Freq: Two times a day (BID) | ORAL | Status: DC
  Administered 2016-10-08 (×2): 50 mg via ORAL
  Filled 2016-10-08 (×2): qty 1

## 2016-10-08 NOTE — Progress Notes (Signed)
Pt's HR dropping to 37 frequently but non-sustaining, pt is awake, denies chest pain, not in respiratory distress, asymptomatic. Dr. Aundra Dubin was notified and no new orders given. Will monitor pt accordingly.

## 2016-10-08 NOTE — Evaluation (Signed)
Physical Therapy Evaluation Patient Details Name: Nikayla Madaris MRN: [id number] DOB: Jun 16, 1931 Today's Date: 10/08/2016   History of Present Illness  Patient is an 83 yo female admitted 10/07/16 after d/c from [facility] (SNF) with persistent Afib with RVR.  Admitted for tikosyn loading with possible need for DCCV per MD note.     PMH:  Obesity, HOH, HTN, depression, anxiety, lung CA, HLD, PE, incontinent bladder  Clinical Impression  Patient presents with problems listed below.  Will benefit from acute PT to maximize functional mobility prior to discharge.  Per patient and daughter, patient completed her rehab at [facility] and is planning to return home at d/c.  Patient will need 24 hour assist initially - daughter reports she and patient's son will be providing 24 hour assist initially. Unsure how long they will be able to do this.  Recommend HHPT for continued therapy at d/c.    Follow Up Recommendations Home health PT;Supervision for mobility/OOB    Equipment Recommendations  None recommended by PT    Recommendations for Other Services       Precautions / Restrictions Precautions Precautions: Fall Precaution Comments: monitor HR Restrictions Weight Bearing Restrictions: No      Mobility  Bed Mobility Overal bed mobility: Needs Assistance Bed Mobility: Supine to Sit;Sit to Supine     Supine to sit: Min assist Sit to supine: Min guard   General bed mobility comments: Verbal cues for mobility.  Assist to raise trunk to sitting position.  Patient able to return to supine with no physical assist.  Transfers Overall transfer level: Needs assistance Equipment used: Rolling walker (2 wheeled) Transfers: Sit to/from Stand Sit to Stand: Min guard         General transfer comment: Patient using correct hand placement.  Assist for safety only.  Ambulation/Gait Ambulation/Gait assistance: Min guard Ambulation Distance (Feet): 22 Feet Assistive device: Rolling  walker (2 wheeled) Gait Pattern/deviations: Step-through pattern;Decreased stride length;Shuffle;Trunk flexed Gait velocity: decreased Gait velocity interpretation: Below normal speed for age/gender General Gait Details: Patient using RW safely.  Assist for safety.  HR at 73 at rest.  Increased to 103 during gait, and returned to 80 at rest.  Stairs            Wheelchair Mobility    Modified Rankin (Stroke Patients Only)       Balance                                             Pertinent Vitals/Pain Pain Assessment: No/denies pain    Home Living Family/patient expects to be discharged to:: Private residence Living Arrangements: Alone Available Help at Discharge: Family;Available 24 hours/day;Available PRN/intermittently (Dtr and son 24 hours for few days, then prn) Type of Home: House Home Access: Stairs to enter Entrance Stairs-Rails: Right;Left;Can reach both Entrance Stairs-Number of Steps: 4 Home Layout: One level Home Equipment: Walker - 2 wheels;Cane - single point;Shower seat;Walker - 4 wheels;Bedside commode (Lift chair)      Prior Function Level of Independence: Independent (Baseline - prior to SNF admit)         Comments: Independent with ambulation and ADL's.  Drives     Hand Dominance        Extremity/Trunk Assessment   Upper Extremity Assessment Upper Extremity Assessment: Generalized weakness    Lower Extremity Assessment Lower Extremity Assessment: Generalized weakness  Communication   Communication: HOH  Cognition Arousal/Alertness: Awake/alert Behavior During Therapy: WFL for tasks assessed/performed Overall Cognitive Status: Within Functional Limits for tasks assessed                      General Comments      Exercises     Assessment/Plan    PT Assessment Patient needs continued PT services  PT Problem List Decreased strength;Decreased activity tolerance;Decreased balance;Decreased  mobility;Cardiopulmonary status limiting activity;Obesity       PT Treatment Interventions DME instruction;Gait training;Stair training;Functional mobility training;Therapeutic activities;Therapeutic exercise;Patient/family education    PT Goals (Current goals can be found in the Care Plan section)  Acute Rehab PT Goals Patient Stated Goal: To go home at d/c PT Goal Formulation: With patient/family Time For Goal Achievement: 10/15/16 Potential to Achieve Goals: Good    Frequency Min 3X/week   Barriers to discharge Decreased caregiver support Daughter and son to provide 24 hour assist initially at d/c.  Then will be available prn    Co-evaluation               End of Session Equipment Utilized During Treatment: Gait belt Activity Tolerance: Patient limited by fatigue Patient left: in bed;with call bell/phone within reach;with bed alarm set;with family/visitor present Nurse Communication: Mobility status PT Visit Diagnosis: Difficulty in walking, not elsewhere classified (R26.2);Muscle weakness (generalized) (M62.81)         Time: 7209-4709 PT Time Calculation (min) (ACUTE ONLY): 17 min   Charges:   PT Evaluation $PT Eval Moderate Complexity: 1 Procedure     PT G Codes:         Despina Pole 10/12/2016, 8:00 PM Carita Pian. Sanjuana Kava, [place] Pager [phone]

## 2016-10-08 NOTE — Progress Notes (Signed)
Pt converted to NSR  Called by central monitor.  EKG done says undetermined rhythm, otherwise normal ECG.  Notified A. Lynnell Jude, NP.  Says continue with tikosyn as ordered.  Karie Kirks, Therapist, sports.

## 2016-10-08 NOTE — Progress Notes (Signed)
SUBJECTIVE: The patient is doing well today.  At this time, she denies chest pain, shortness of breath, or any new concerns.  CURRENT MEDICATIONS: . apixaban  5 mg Oral BID  . atorvastatin  20 mg Oral Daily  . budesonide  0.25 mg Nebulization BID  . cholecalciferol  1,000 Units Oral Daily  . dofetilide  500 mcg Oral BID  . fenofibrate  160 mg Oral Daily  . ferrous sulfate  325 mg Oral BID WC  . fluticasone  1 spray Each Nare Daily  . hydrocortisone  25 mg Rectal QHS  . ipratropium-albuterol  3 mL Nebulization BID  . magnesium oxide  400 mg Oral BID  . metoprolol tartrate  50 mg Oral BID  . montelukast  10 mg Oral Daily  . multivitamin with minerals  1 tablet Oral Daily  . pantoprazole  40 mg Oral Daily  . PARoxetine  20 mg Oral QHS  . pyridOXINE  100 mg Oral Daily  . sodium chloride flush  3 mL Intravenous Q12H  . vitamin B-12  1,000 mcg Oral Daily     OBJECTIVE: Physical Exam: Vitals:   10/07/16 2143 10/07/16 2252 10/08/16 0105 10/08/16 0554  BP:  119/60 (!) 109/56 120/62  Pulse:  (!) 108 (!) 111 (!) 140  Resp:    18  Temp:   98.8 F (37.1 C) 98.3 F (36.8 C)  TempSrc:   Oral Oral  SpO2: 95%  95% 94%  Weight:    233 lb 8 oz (105.9 kg)  Height:        Intake/Output Summary (Last 24 hours) at 10/08/16 8527 Last data filed at 10/07/16 1500  Gross per 24 hour  Intake              400 ml  Output                0 ml  Net              400 ml    Telemetry reveals AF w RVR  GEN- The patient is elderly and obese appearing, alert and oriented x 3 today, +HOH Head- normocephalic, atraumatic Eyes-  Sclera clear, conjunctiva pink Ears- hearing intact Oropharynx- clear Neck- supple  Lungs- Clear to ausculation bilaterally, normal work of breathing Heart- Tachycardic irregular rate and rhythm  GI- soft, NT, ND, + BS Extremities- no clubbing, cyanosis, or edema Skin- no rash or lesion Psych- euthymic mood, full affect Neuro- strength and sensation are  intact  LABS: Basic Metabolic Panel:  Recent Labs  10/07/16 1101 10/07/16 2059 10/08/16 0513  NA 135 137 137  K 3.8 4.8 4.8  CL 100 101 99*  CO2 33* 32 32  GLUCOSE 93 136* 102*  BUN '20 16 17  '$ CREATININE 0.91 0.86 0.81  CALCIUM 8.7 8.2* 8.0*  MG 1.7 2.1  --     ASSESSMENT AND PLAN:  Active Problems:   Persistent atrial fibrillation (HCC)  1.  Persistent symptomatic AF The patient presents for Tikosyn load  BMET, Mg, QTc stable Continue Tikosyn 563mg bid Will resume Metoprolol and monitor BP  Continue Eliquis long term for CHADS2VASC of 4 If still in AF tomorrow, will need DCCV. Scheduled for 12Noon, NPO after midnight tonight   2.  Obesity Body mass index is 40.41 kg/m. Weight loss advised  3.  HTN Stable No change required today  AChanetta Marshall NP 10/08/2016 7:28 AM  EP Attending  Patient seen and examined. Agree with above. She  is doing well so far with initiation of Tikosyn. Her QT is ok. Her exam reveals an IRIR rhythm with clear lungs and no edema. Tele reveals atrial fib with CVR. Will plan on DCCV tomorrow if still in atrial fib.  Mikle Bosworth.D.

## 2016-10-08 NOTE — Progress Notes (Signed)
Spoke with Dr. Liborio Nixon regarding oders for [employer] initiation.Per orders potassium level needs to be > or =4 and magnesium level needs to be > or = 1.8.Per resulted labs last potassium level 3.8 and magnesium level 1.7 .Patient did receive 40 meq of potassium and 2 gm of IV magnesium however no orders to repeat labs.Dr.Henderson gave orders to check potassium level and magnesium level and to call back if abnormal.If level within parameters to start medication. Also informed MD patient heart rate ranging 120-140's atrial fibrillation.No further orders given at this time.

## 2016-10-08 NOTE — Care Management Note (Signed)
Case Management Note  Patient Details  Name: Scherrie [place] MRN: [id number] Date of Birth: 01-05-1931  Subjective/Objective:    Admitted with Persistent Atrial Fib                Action/Plan: Patient is from [facility] and is to return to SNF at discharge; Annye Rusk following    Expected Discharge Date: possibly 10/12/2016                 Expected Discharge Plan:  [place]  Discharge planning Services  CM Consult     Status of Service:  In process, will continue to follow  Sherrilyn Rist [phone] 10/08/2016, 2:40 PM

## 2016-10-08 NOTE — Progress Notes (Signed)
RE: Benefit check  Received: Today  Memory Argue CMA        S/W [facility] @ [place] # [phone]   1. TIKOSYN  500 MCG  COVER- NO- NOT COVER  PRIOR APPROVAL- YES # [phone] FOR EXCEPTION   2. DOFETILIDE  500 MCG BID  COVER- YES  CO-AP- $ 57.81  TIER- 4 DRUG  PRIOR APPROVAL - NO   PHARMACY : ANY RETAIL

## 2016-10-09 ENCOUNTER — Encounter (HOSPITAL_COMMUNITY)
Admission: AD | Disposition: A | Discharge status: Home Health | Payer: Self-pay | Source: Ambulatory Visit | Attending: Internal Medicine

## 2016-10-09 LAB — BASIC METABOLIC PANEL
Anion gap: 5 (ref 5–15)
BUN: 15 mg/dL (ref 6–20)
CALCIUM: 7.9 mg/dL — AB (ref 8.9–10.3)
CO2: 31 mmol/L (ref 22–32)
Chloride: 102 mmol/L (ref 101–111)
Creatinine, Ser: 0.85 mg/dL (ref 0.44–1.00)
GFR calc non Af Amer: >60 mL/min (ref >60)
Glucose, Bld: 87 mg/dL (ref 65–99)
POTASSIUM: 4 mmol/L (ref 3.5–5.1)
Sodium: 138 mmol/L (ref 135–145)

## 2016-10-09 LAB — MAGNESIUM: Magnesium: 1.9 mg/dL (ref 1.7–2.4)

## 2016-10-09 SURGERY — CARDIOVERSION
Anesthesia: Monitor Anesthesia Care

## 2016-10-09 MED ORDER — METOPROLOL TARTRATE 50 MG PO TABS
75.0000 mg | ORAL_TABLET | Freq: Two times a day (BID) | ORAL | Status: DC
  Administered 2016-10-09: 75 mg via ORAL
  Filled 2016-10-09 (×3): qty 1

## 2016-10-09 NOTE — Care Management Note (Addendum)
Case Management Note  Patient Details  Name: Symiah Nowotny MRN: [id number] Date of Birth: 07-09-1931  Subjective/Objective:  Admitted with Persistent Atrial Fib                  Action/Plan: Patient was recently admitted to a SNF for therapy and stated that she does not wish to return to the facility at discharge and will return home with [facility] services; her daughter and son will stay with her for added support;. HHC choice offered, pt chose [facility]; CM called Vaughan Basta for [facility] arrangements, orders/ clinical information faxed as requested. Attending MD please enter the face to face in Epic for [facility] services.  Expected Discharge Date:      Possibly 10/10/2016            Expected Discharge Plan:  [place]  Discharge planning Services  CM Consult  DME Arranged:  Kasandra Knudsen DME Agency:  [place] Arranged:  RN, PT [facility] Agency:  [facility]  Status of Service:  In process, will continue to follow  Sherrilyn Rist [phone] 10/09/2016, 3:18 PM

## 2016-10-09 NOTE — Discharge Summary (Signed)
ELECTROPHYSIOLOGY PROCEDURE DISCHARGE SUMMARY    Patient ID: Tricia Hernandez,  MRN: [id number], DOB/AGE: 09-30-1930 83 y.o.  Admit date: 10/07/2016 Discharge date: 10/10/2016  Primary Care Physician: Wilfred Lacy, NP Electrophysiologist: Lovena Le  Primary Discharge Diagnosis:  1.  Persistent atrial fibrillation status post Tikosyn loading this admission  Secondary Discharge Diagnosis:  1.  Obesity 2.  HTN 3.  Depression 4.  Prior lung cancer 5.  Anal cancer  Allergies  Allergen Reactions  . Penicillins Anaphylaxis    Reaction:  Unknown  Has patient had a PCN reaction causing immediate rash, facial/tongue/throat swelling, SOB or lightheadedness with hypotension: Unsure Has patient had a PCN reaction causing severe rash involving mucus membranes or skin necrosis: Unsure Has patient had a PCN reaction that required hospitalization Unsure Has patient had a PCN reaction occurring within the last 10 years: Unsure If all of the above answers are "NO", then may proceed with Cephalosporin use.   [name] [Naproxen Sodium] Hives  . Antihistamines, Chlorpheniramine-Type Rash    Reaction:  Unknown   . Aspirin Rash  . Codeine Rash  . Hydrochlorothiazide Rash  . Rocephin [Ceftriaxone Sodium In Dextrose] Rash  . Sulfa Antibiotics Rash     Procedures This Admission:  1.  Tikosyn loading  Brief HPI: Tricia Hernandez is a 83 y.o. female with a past medical history as noted above.  They were referred to EP in the outpatient setting for treatment options of atrial fibrillation.  Risks, benefits, and alternatives to Tikosyn were reviewed with the patient who wished to proceed.    Hospital Course:  The patient was admitted and Tikosyn was initiated.  Renal function and electrolytes were followed during the hospitalization.  Their QTc remained stable.  They were monitored until discharge on telemetry which demonstrated sinus rhythm with PAC's.  On the day of discharge, they were examined by  Dr Lovena Le who considered them stable for discharge to home.  Follow-up has been arranged with AF clinic in 1 week and with Dr Lovena Le in 4 weeks.   Physical Exam: Vitals:   10/10/16 0640 10/10/16 0800 10/10/16 0909 10/10/16 0911  BP: (!) 110/46 (!) 89/51    Pulse: 62 65    Resp: 20     Temp: 98.7 F (37.1 C)     TempSrc: Oral     SpO2: 94%  97% 100%  Weight: 233 lb 9.6 oz (106 kg)     Height:        GEN- The patient is elderly and obese appearing, alert and oriented x 3 today.   HEENT: normocephalic, atraumatic; sclera clear, conjunctiva pink; hearing intact; oropharynx clear; neck supple  Lungs- Clear to ausculation bilaterally, normal work of breathing.  No wheezes, rales, rhonchi Heart- Regular rate and rhythm, no murmurs, rubs or gallops  GI- soft, non-tender, non-distended, bowel sounds present  Extremities- no clubbing, cyanosis, or edema; DP/PT/radial pulses 2+ bilaterally MS- no significant deformity or atrophy Skin- warm and dry, no rash or lesion Psych- euthymic mood, full affect Neuro- strength and sensation are intact   Labs:   Lab Results  Component Value Date   WBC 12.1 (H) 09/02/2016   HGB 10.3 (L) 09/02/2016   HCT 33.6 (L) 09/02/2016   MCV 79.8 09/02/2016   PLT 169 09/02/2016     Recent Labs Lab 10/10/16 0429  NA 139  K 4.2  CL 104  CO2 31  BUN 13  CREATININE 0.91  CALCIUM 8.0*  GLUCOSE 100*  Discharge Medications:  Allergies as of 10/10/2016      Reactions   Penicillins Anaphylaxis   Reaction:  Unknown  Has patient had a PCN reaction causing immediate rash, facial/tongue/throat swelling, SOB or lightheadedness with hypotension: Unsure Has patient had a PCN reaction causing severe rash involving mucus membranes or skin necrosis: Unsure Has patient had a PCN reaction that required hospitalization Unsure Has patient had a PCN reaction occurring within the last 10 years: Unsure If all of the above answers are "NO", then may proceed with  Cephalosporin use.   Aleve [naproxen Sodium] Hives   Antihistamines, Chlorpheniramine-type Rash   Reaction:  Unknown    Aspirin Rash   Codeine Rash   Hydrochlorothiazide Rash   Rocephin [ceftriaxone Sodium In Dextrose] Rash   Sulfa Antibiotics Rash      Medication List    TAKE these medications   atorvastatin 20 MG tablet Commonly known as:  LIPITOR Take 20 mg by mouth daily.   budesonide 0.25 MG/2ML nebulizer solution Commonly known as:  PULMICORT Take 2 mLs (0.25 mg total) by nebulization 2 (two) times daily.   Calcium 600-200 MG-UNIT tablet Take 1 tablet by mouth daily.   cholecalciferol 1000 units tablet Commonly known as:  VITAMIN D Take 1,000 Units by mouth daily.   dofetilide 500 MCG capsule Commonly known as:  TIKOSYN Take 1 capsule (500 mcg total) by mouth 2 (two) times daily.   ELIQUIS 5 MG Tabs tablet Generic drug:  apixaban Take 5 mg by mouth 2 (two) times daily.   emollient cream Commonly known as:  BIAFINE Apply topically as needed. Apply cream to area of skin breakdown in groin folds. Please apply at night before bed. Please wash off with AM shower and do not reapply before radiation therapy.   ENSURE PLUS Liqd Take 237 mLs by mouth 3 (three) times daily between meals.   fenofibrate 160 MG tablet Take 160 mg by mouth daily.   ferrous sulfate 325 (65 FE) MG tablet Take 325 mg by mouth 2 (two) times daily with a meal.   fluticasone 50 MCG/ACT nasal spray Commonly known as:  FLONASE Place 1 spray into both nostrils daily.   folic acid 546 MCG tablet Commonly known as:  FOLVITE Take 400 mcg by mouth daily.   hydrocortisone 25 MG suppository Commonly known as:  ANUSOL-HC Place 25 mg rectally at bedtime.   ipratropium-albuterol 0.5-2.5 (3) MG/3ML Soln Commonly known as:  DUONEB Take 3 mLs by nebulization 2 (two) times daily.   magnesium oxide 400 (241.3 Mg) MG tablet Commonly known as:  MAG-OX Take 1 tablet (400 mg total) by mouth 2 (two)  times daily.   metoprolol tartrate 25 MG tablet Commonly known as:  LOPRESSOR Take 1 tablet (25 mg total) by mouth 2 (two) times daily.   montelukast 10 MG tablet Commonly known as:  SINGULAIR Take 10 mg by mouth daily.   multivitamin with minerals Tabs tablet Take 1 tablet by mouth daily.   omeprazole 40 MG capsule Commonly known as:  PRILOSEC Take 40 mg by mouth daily.   PARoxetine 20 MG tablet Commonly known as:  PAXIL Take 20 mg by mouth at bedtime.   pyridOXINE 100 MG tablet Commonly known as:  VITAMIN B-6 Take 100 mg by mouth daily.   vitamin B-12 1000 MCG tablet Commonly known as:  CYANOCOBALAMIN Take 1,000 mcg by mouth daily.   zolpidem 5 MG tablet Commonly known as:  AMBIEN Take 1 tablet (5 mg total) by mouth  at bedtime as needed for sleep.       Disposition:  Discharge Instructions    Diet - low sodium heart healthy    Complete by:  As directed    Increase activity slowly    Complete by:  As directed      Follow-up Information    Rafael Hernandez Follow up on 10/16/2016.   Specialty:  Cardiology Why:  at [facility] information: [address] [id number] Montura [place], [place] Follow up on 11/14/2016.   Specialty:  Cardiology Why:  at 3:15PM  Contact information: [address] [address] [phone]           Duration of Discharge Encounter: Greater than 30 minutes including physician time.  Signed, Chanetta Marshall, NP 10/10/2016 10:53 AM  EP Attending  Patient seen and examined. Agree with above. Her QT is ok and she is maintaining NSR with PACs. Plan to DC home with usual followup.  Mikle Bosworth.D.

## 2016-10-09 NOTE — Progress Notes (Signed)
Physical Therapy Treatment Patient Details Name: Lamesha Tibbits MRN: [id number] DOB: [birth year] Today's Date: 10/09/2016    History of Present Illness Patient is an 83 yo female admitted 10/07/16 after d/c from [facility] (SNF) with persistent Afib with RVR.  Admitted for tikosyn loading with possible need for DCCV per MD note.     PMH:  Obesity, HOH, HTN, depression, anxiety, lung CA, HLD, PE, incontinent bladder    PT Comments    Pt reports feeling good today. She is agreeable and motivated to participate in therapy. Decreased assist needed for mobility and increased gait distance noted. HR remained in upper 60s to low 80s throughout Rx. No SOB.  Pt reports possible d/c home tomorrow.   Follow Up Recommendations  Home health PT;Supervision for mobility/OOB     Equipment Recommendations  None recommended by PT    Recommendations for Other Services       Precautions / Restrictions Precautions Precautions: Fall Precaution Comments: monitor HR    Mobility  Bed Mobility         Supine to sit: Supervision;HOB elevated     General bed mobility comments: +rail, no physical assist needed  Transfers   Equipment used: Rolling walker (2 wheeled)   Sit to Stand: Min guard         General transfer comment: Pt demo safe technique.  Ambulation/Gait Ambulation/Gait assistance: Min guard Ambulation Distance (Feet): 100 Feet Assistive device: Rolling walker (2 wheeled) Gait Pattern/deviations: Step-through pattern;Trunk flexed Gait velocity: mildly decreased   General Gait Details: Steady gait. HR ranged upper 60s to low 80s throughout Rx. No SOB noted.   Stairs            Wheelchair Mobility    Modified Rankin (Stroke Patients Only)       Balance                                    Cognition Arousal/Alertness: Awake/alert Behavior During Therapy: WFL for tasks assessed/performed Overall Cognitive Status: Within Functional Limits for  tasks assessed                 General Comments: HOH    Exercises      General Comments        Pertinent Vitals/Pain Pain Assessment: No/denies pain    Home Living                      Prior Function            PT Goals (current goals can now be found in the care plan section) Acute Rehab PT Goals Patient Stated Goal: To go home at d/c PT Goal Formulation: With patient/family Time For Goal Achievement: 10/15/16 Potential to Achieve Goals: Good Progress towards PT goals: Progressing toward goals    Frequency    Min 3X/week      PT Plan Current plan remains appropriate    Co-evaluation             End of Session Equipment Utilized During Treatment: Gait belt Activity Tolerance: Patient tolerated treatment well Patient left: in chair;with chair alarm set;with call bell/phone within reach Nurse Communication: Mobility status PT Visit Diagnosis: Difficulty in walking, not elsewhere classified (R26.2)     Time: 2409-7353 PT Time Calculation (min) (ACUTE ONLY): 24 min  Charges:  $Gait Training: 23-37 mins  G Codes:       Lorriane Shire 10/09/2016, 10:49 AM

## 2016-10-09 NOTE — Progress Notes (Signed)
Pressure of 93/52 HR of 59 at 1020. Metoprolol delayed, will recheck BP in 30 minutes and re-assess.

## 2016-10-09 NOTE — Progress Notes (Signed)
SUBJECTIVE: The patient is doing well today.  At this time, she denies chest pain, shortness of breath, or any new concerns.  CURRENT MEDICATIONS: . apixaban  5 mg Oral BID  . atorvastatin  20 mg Oral Daily  . budesonide  0.25 mg Nebulization BID  . cholecalciferol  1,000 Units Oral Daily  . dofetilide  500 mcg Oral BID  . fenofibrate  160 mg Oral Daily  . ferrous sulfate  325 mg Oral BID WC  . fluticasone  1 spray Each Nare Daily  . hydrocortisone  25 mg Rectal QHS  . ipratropium-albuterol  3 mL Nebulization BID  . magnesium oxide  400 mg Oral BID  . metoprolol tartrate  50 mg Oral BID  . montelukast  10 mg Oral Daily  . multivitamin with minerals  1 tablet Oral Daily  . pantoprazole  40 mg Oral Daily  . PARoxetine  20 mg Oral QHS  . pyridOXINE  100 mg Oral Daily  . sodium chloride flush  3 mL Intravenous Q12H  . vitamin B-12  1,000 mcg Oral Daily     OBJECTIVE: Physical Exam: Vitals:   10/08/16 1219 10/08/16 2057 10/08/16 2114 10/09/16 0428  BP: 113/71 (!) 100/52  93/65  Pulse: 99 76 71 88  Resp: '18 18 18 18  '$ Temp: 98.4 F (36.9 C) 98.5 F (36.9 C)  98.3 F (36.8 C)  TempSrc: Oral Oral  Oral  SpO2: 97% 98% 97% 94%  Weight:    234 lb 11.2 oz (106.5 kg)  Height:        Intake/Output Summary (Last 24 hours) at 10/09/16 0749 Last data filed at 10/08/16 2234  Gross per 24 hour  Intake              480 ml  Output                1 ml  Net              479 ml    Telemetry reveals sinus rhythm with frequent atrial ectopy   GEN- The patient is elderly and obese appearing, alert and oriented x 3 today, +HOH Head- normocephalic, atraumatic Eyes-  Sclera clear, conjunctiva pink Ears- hearing intact Oropharynx- clear Neck- supple  Lungs- Clear to ausculation bilaterally, normal work of breathing Heart-  irregular rate and rhythm  GI- soft, NT, ND, + BS Extremities- no clubbing, cyanosis, or edema Skin- no rash or lesion Psych- euthymic mood, full  affect Neuro- strength and sensation are intact  LABS: Basic Metabolic Panel:  Recent Labs  10/07/16 2059 10/08/16 0513 10/09/16 0318  NA 137 137 138  K 4.8 4.8 4.0  CL 101 99* 102  CO2 32 32 31  GLUCOSE 136* 102* 87  BUN '16 17 15  '$ CREATININE 0.86 0.81 0.85  CALCIUM 8.2* 8.0* 7.9*  MG 2.1  --  1.9    ASSESSMENT AND PLAN:  Active Problems:   Persistent atrial fibrillation (HCC)  1.  Persistent symptomatic AF The patient presents for Tikosyn load  BMET, Mg, QTc stable Continue Tikosyn 569mg bid Continue Metoprolol - will increase to '75mg'$  twice daily to help with ectopy and monitor BP  Continue Eliquis long term for CHADS2VASC of 4  2.  Obesity Body mass index is 40.41 kg/m. Weight loss advised  3.  HTN Stable No change required today  AChanetta Marshall NP 10/09/2016 7:49 AM   EP Attending  Patient seen and examined. Agree with above. The patient  is doing well and has reverted back to NSR with PAC's and PVC's. Her exam reveals an irregular rhythm with clear lungs and no/trace edema. Neuro is non-focal. Tele is reviewed by me and demonstrates NSR with PAC's and PVC's. Labs are reviewed as well.  A/P 1. Persistent atrial fib - she has reverted no NSR. Will continue Dofetilide. Her QT is ok. Will anticpate DC home tomorrow. Usual followup. 2. Obesity - weight loss has been discussed and encouraged. 3. HTN - her blood pressure is controlled. Her beta blocker has been uptitrated.  Mikle Bosworth.D.

## 2016-10-10 LAB — BASIC METABOLIC PANEL
Anion gap: 4 — ABNORMAL LOW (ref 5–15)
BUN: 13 mg/dL (ref 6–20)
CALCIUM: 8 mg/dL — AB (ref 8.9–10.3)
CO2: 31 mmol/L (ref 22–32)
CREATININE: 0.91 mg/dL (ref 0.44–1.00)
Chloride: 104 mmol/L (ref 101–111)
GFR calc Af Amer: >60 mL/min (ref >60)
GFR, EST NON AFRICAN AMERICAN: 57 mL/min — AB (ref >60)
GLUCOSE: 100 mg/dL — AB (ref 65–99)
Potassium: 4.2 mmol/L (ref 3.5–5.1)
SODIUM: 139 mmol/L (ref 135–145)

## 2016-10-10 LAB — MAGNESIUM: Magnesium: 2.1 mg/dL (ref 1.7–2.4)

## 2016-10-10 MED ORDER — DOFETILIDE 500 MCG PO CAPS: 500.0000 ug | ORAL_CAPSULE | Freq: Two times a day (BID) | ORAL | 3 refills | Status: DC

## 2016-10-10 MED ORDER — METOPROLOL TARTRATE 25 MG PO TABS: 25.0000 mg | ORAL_TABLET | Freq: Two times a day (BID) | ORAL | 1 refills | Status: DC

## 2016-10-10 MED ORDER — MAGNESIUM OXIDE 400 (241.3 MG) MG PO TABS: 400.0000 mg | ORAL_TABLET | Freq: Two times a day (BID) | ORAL | 1 refills | Status: DC

## 2016-10-10 NOTE — Progress Notes (Signed)
Patient is discharging home on Tikosyn; a 7 day supply will be given to the patient by [facility] from the [employer]; Jacinto Reap [place] RN,MHA,BSN [phone]

## 2016-10-10 NOTE — Progress Notes (Signed)
Page to A.Seiler,PA regarding pt's low BP and pending discharge, as well as pass care managements note/request for hard copy rx for tykosin

## 2016-10-10 NOTE — Progress Notes (Signed)
A.Seiler,PA notified of hypotension s/p metoprolol increase. Per [employer] rx for metoprolol will return to '50mg'$  BID versus the '75mg'$  BID increase.  We can also expect Tykosin rx on the unit prior to pt d/c.

## 2016-10-10 NOTE — Progress Notes (Signed)
Pt has orders to be discharged. Discharge instructions given and pt has no additional questions at this time. Medication regimen reviewed and pt educated. Pt verbalized understanding and has no additional questions. Telemetry box removed. IV removed and site in good condition. Pt stable and waiting for transportation.   Allona Gondek RN 

## 2016-10-10 NOTE — Discharge Instructions (Signed)

## 2016-10-13 DIAGNOSIS — E669 Obesity, unspecified: Secondary | ICD-10-CM | POA: Diagnosis not present

## 2016-10-13 DIAGNOSIS — F329 Major depressive disorder, single episode, unspecified: Secondary | ICD-10-CM | POA: Diagnosis not present

## 2016-10-13 DIAGNOSIS — I110 Hypertensive heart disease with heart failure: Secondary | ICD-10-CM | POA: Diagnosis not present

## 2016-10-13 DIAGNOSIS — Z433 Encounter for attention to colostomy: Secondary | ICD-10-CM | POA: Diagnosis not present

## 2016-10-13 DIAGNOSIS — I481 Persistent atrial fibrillation: Secondary | ICD-10-CM | POA: Diagnosis not present

## 2016-10-13 DIAGNOSIS — I5030 Unspecified diastolic (congestive) heart failure: Secondary | ICD-10-CM | POA: Diagnosis not present

## 2016-10-15 ENCOUNTER — Encounter (HOSPITAL_COMMUNITY): Payer: Self-pay | Admitting: Nurse Practitioner

## 2016-10-15 ENCOUNTER — Ambulatory Visit (HOSPITAL_COMMUNITY)
Admission: RE | Admit: 2016-10-15 | Discharge: 2016-10-15 | Disposition: A | Discharge status: Home / Self Care | Payer: Medicare Other | Source: Ambulatory Visit | Attending: Nurse Practitioner | Admitting: Nurse Practitioner

## 2016-10-15 VITALS — BP 86/50 | HR 73 | Ht 64.0 in | Wt 242.6 lb

## 2016-10-15 DIAGNOSIS — Z85118 Personal history of other malignant neoplasm of bronchus and lung: Secondary | ICD-10-CM | POA: Insufficient documentation

## 2016-10-15 DIAGNOSIS — I110 Hypertensive heart disease with heart failure: Secondary | ICD-10-CM | POA: Insufficient documentation

## 2016-10-15 DIAGNOSIS — Z880 Allergy status to penicillin: Secondary | ICD-10-CM | POA: Insufficient documentation

## 2016-10-15 DIAGNOSIS — Z803 Family history of malignant neoplasm of breast: Secondary | ICD-10-CM | POA: Insufficient documentation

## 2016-10-15 DIAGNOSIS — R609 Edema, unspecified: Secondary | ICD-10-CM | POA: Diagnosis not present

## 2016-10-15 DIAGNOSIS — F419 Anxiety disorder, unspecified: Secondary | ICD-10-CM | POA: Insufficient documentation

## 2016-10-15 DIAGNOSIS — Z8042 Family history of malignant neoplasm of prostate: Secondary | ICD-10-CM | POA: Insufficient documentation

## 2016-10-15 DIAGNOSIS — Z85828 Personal history of other malignant neoplasm of skin: Secondary | ICD-10-CM | POA: Insufficient documentation

## 2016-10-15 DIAGNOSIS — Z933 Colostomy status: Secondary | ICD-10-CM | POA: Diagnosis not present

## 2016-10-15 DIAGNOSIS — Z86711 Personal history of pulmonary embolism: Secondary | ICD-10-CM | POA: Insufficient documentation

## 2016-10-15 DIAGNOSIS — I471 Supraventricular tachycardia: Secondary | ICD-10-CM | POA: Insufficient documentation

## 2016-10-15 DIAGNOSIS — K219 Gastro-esophageal reflux disease without esophagitis: Secondary | ICD-10-CM | POA: Diagnosis not present

## 2016-10-15 DIAGNOSIS — Z7901 Long term (current) use of anticoagulants: Secondary | ICD-10-CM | POA: Diagnosis not present

## 2016-10-15 DIAGNOSIS — I509 Heart failure, unspecified: Secondary | ICD-10-CM | POA: Diagnosis not present

## 2016-10-15 DIAGNOSIS — E785 Hyperlipidemia, unspecified: Secondary | ICD-10-CM | POA: Insufficient documentation

## 2016-10-15 DIAGNOSIS — Z886 Allergy status to analgesic agent status: Secondary | ICD-10-CM | POA: Insufficient documentation

## 2016-10-15 DIAGNOSIS — E559 Vitamin D deficiency, unspecified: Secondary | ICD-10-CM | POA: Diagnosis not present

## 2016-10-15 DIAGNOSIS — F329 Major depressive disorder, single episode, unspecified: Secondary | ICD-10-CM | POA: Insufficient documentation

## 2016-10-15 DIAGNOSIS — I4891 Unspecified atrial fibrillation: Secondary | ICD-10-CM

## 2016-10-15 DIAGNOSIS — Z823 Family history of stroke: Secondary | ICD-10-CM | POA: Insufficient documentation

## 2016-10-15 DIAGNOSIS — Z87891 Personal history of nicotine dependence: Secondary | ICD-10-CM | POA: Insufficient documentation

## 2016-10-15 DIAGNOSIS — I480 Paroxysmal atrial fibrillation: Secondary | ICD-10-CM | POA: Insufficient documentation

## 2016-10-15 DIAGNOSIS — Z6841 Body Mass Index (BMI) 40.0 and over, adult: Secondary | ICD-10-CM | POA: Insufficient documentation

## 2016-10-15 DIAGNOSIS — Z79899 Other long term (current) drug therapy: Secondary | ICD-10-CM | POA: Diagnosis not present

## 2016-10-15 DIAGNOSIS — E669 Obesity, unspecified: Secondary | ICD-10-CM | POA: Insufficient documentation

## 2016-10-15 DIAGNOSIS — Z8711 Personal history of peptic ulcer disease: Secondary | ICD-10-CM | POA: Insufficient documentation

## 2016-10-15 DIAGNOSIS — Z8249 Family history of ischemic heart disease and other diseases of the circulatory system: Secondary | ICD-10-CM | POA: Insufficient documentation

## 2016-10-15 LAB — BASIC METABOLIC PANEL
Anion gap: 8 (ref 5–15)
BUN: 14 mg/dL (ref 6–20)
CHLORIDE: 102 mmol/L (ref 101–111)
CO2: 30 mmol/L (ref 22–32)
CREATININE: 0.83 mg/dL (ref 0.44–1.00)
Calcium: 8.9 mg/dL (ref 8.9–10.3)
Glucose, Bld: 110 mg/dL — ABNORMAL HIGH (ref 65–99)
POTASSIUM: 4.1 mmol/L (ref 3.5–5.1)
SODIUM: 140 mmol/L (ref 135–145)

## 2016-10-15 LAB — MAGNESIUM: Magnesium: 2 mg/dL (ref 1.7–2.4)

## 2016-10-15 NOTE — Progress Notes (Signed)
Primary Care Physician: Wilfred Lacy, NP Referring Physician: Dr. Jacklynn Barnacle f/u   Tricia Hernandez is a 83 y.o. female with a h/o paroxsymal afib that was recently admitted 2/26 thru 3/1 for tikosyn initiation. Qtc stayed stable. She is in the clinic with her daughter which oversees her care. Precautions re tikosyn and potential for qtc prolongation were discussed with daughter. She has had some LLE edema since being home usually improved with elevation of legs. More noticeable this am after the hour drive here.   Today, she denies symptoms of palpitations, chest pain, shortness of breath, orthopnea, PND,  dizziness, presyncope, syncope, or neurologic sequela. Posot The patient is tolerating medications without difficulties and is otherwise without complaint today.   Past Medical History:  Diagnosis Date  . 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 Anemia   . Anxiety   . Atrial fibrillation ([place])   . Basal cell carcinoma of skin of nose   . CHF (congestive heart failure) ([place]) 11/2015  . Chronic bronchitis ([place])   . Chronic depression   . Colostomy in place [facility])   . Diverticular disease   . Fibrocystic disease of breast   . Fracture of shoulder   . GERD (gastroesophageal reflux disease)   . Herpes zoster   . History of blood transfusion    "related to emergent hysterectomy"  . History of hiatal hernia   . Hyperlipidemia   . Hypertension   . Labyrinthitis   . Lung cancer ([place])    RML  . Obesity   . Paroxysmal supraventricular tachycardia ([place])   . Peptic ulcer of stomach    hx  . Pneumonia 1980s X 1; 08/2016   "walking; double"  . Pulmonary embolism ([place]) 05/2014   "left lung"  . Vitamin D deficiency    Past Surgical History:  Procedure Laterality Date  . APPENDECTOMY  1959  . BASAL CELL CARCINOMA EXCISION     nose  . BREAST CYST ASPIRATION Right 2016?  Marland Kitchen CATARACT EXTRACTION W/ INTRAOCULAR LENS  IMPLANT, BILATERAL  Bilateral 1990s  . CHOLECYSTECTOMY OPEN  1980s  . COLON SURGERY    . COLOSTOMY N/A 07/29/2016   Procedure: COLOSTOMY;  Surgeon: Clovis Riley, MD;  Location: [place];  Service: General;  Laterality: N/A;  . DILATION AND CURETTAGE OF UTERUS    . EVALUATION UNDER ANESTHESIA WITH HEMORRHOIDECTOMY AND PROCTOSCOPY N/A 07/25/2016   Procedure: EXAM UNDER ANESTHESIA, EXCISION PERIANAL MASS.;  Surgeon: Judeth Horn, MD;  Location: [place];  Service: General;  Laterality: N/A;  Prone position  . LAPAROSCOPIC DIVERTED COLOSTOMY N/A 07/29/2016   Procedure: ATTEMPTED LAPAROSCOPIC ASSISTED COLOSTOMY;  Surgeon: Clovis Riley, MD;  Location: [place];  Service: General;  Laterality: N/A;  . LUNG REMOVAL, PARTIAL Right 2013   RML mass/carcinoid, Dr Lurena Nida  . TONSILLECTOMY AND ADENOIDECTOMY  1960s  . VAGINAL HYSTERECTOMY  1959   "partial"    Current Outpatient Prescriptions  Medication Sig Dispense Refill  . apixaban (ELIQUIS) 5 MG TABS tablet Take 5 mg by mouth 2 (two) times daily.    Marland Kitchen atorvastatin (LIPITOR) 20 MG tablet Take 20 mg by mouth daily.    . budesonide (PULMICORT) 0.25 MG/2ML nebulizer solution Take 2 mLs (0.25 mg total) by nebulization 2 (two) times daily. 60 mL 12  . Calcium 600-200 MG-UNIT tablet Take 1 tablet by mouth daily.    Marland Kitchen dofetilide (TIKOSYN) 500 MCG capsule Take 1 capsule (500 mcg total)  by mouth 2 (two) times daily. 180 capsule 3  . emollient (BIAFINE) cream Apply topically as needed. Apply cream to area of skin breakdown in groin folds. Please apply at night before bed. Please wash off with AM shower and do not reapply before radiation therapy.    . ENSURE PLUS (ENSURE PLUS) LIQD Take 237 mLs by mouth 3 (three) times daily between meals.    . fenofibrate 160 MG tablet Take 160 mg by mouth daily.    . ferrous sulfate 325 (65 FE) MG tablet Take 325 mg by mouth 2 (two) times daily with a meal.    . fluticasone (FLONASE) 50 MCG/ACT nasal spray Place 1 spray into both nostrils daily.     Marland Kitchen ipratropium-albuterol (DUONEB) 0.5-2.5 (3) MG/3ML SOLN Take 3 mLs by nebulization 2 (two) times daily.    . magnesium oxide (MAG-OX) 400 (241.3 Mg) MG tablet Take 1 tablet (400 mg total) by mouth 2 (two) times daily. 60 tablet 1  . Metoprolol Tartrate (LOPRESSOR) 25 MG tablet Take 1 tablet (25 mg total) by mouth 2 (two) times daily. 60 tablet 1  . montelukast (SINGULAIR) 10 MG tablet Take 10 mg by mouth daily.    Marland Kitchen PARoxetine (PAXIL) 20 MG tablet Take 20 mg by mouth at bedtime.      No current facility-administered medications for this encounter.     Allergies  Allergen Reactions  . Penicillins Anaphylaxis    Reaction:  Unknown  Has patient had a PCN reaction causing immediate rash, facial/tongue/throat swelling, SOB or lightheadedness with hypotension: Unsure Has patient had a PCN reaction causing severe rash involving mucus membranes or skin necrosis: Unsure Has patient had a PCN reaction that required hospitalization Unsure Has patient had a PCN reaction occurring within the last 10 years: Unsure If all of the above answers are "NO", then may proceed with Cephalosporin use.   [name] [Naproxen Sodium] Hives  . Antihistamines, Chlorpheniramine-Type Rash    Reaction:  Unknown   . Aspirin Rash  . Codeine Rash  . Hydrochlorothiazide Rash  . Rocephin [Ceftriaxone Sodium In Dextrose] Rash  . Sulfa Antibiotics Rash    Social History   Social History  . Marital status: Widowed    Spouse name: N/A  . Number of children: N/A  . Years of education: N/A   Occupational History  . Not on file.   Social History Main Topics  . Smoking status: Former Smoker    Packs/day: 0.50    Years: 15.00    Types: Cigarettes    Quit date: 1976  . Smokeless tobacco: Never Used  . Alcohol use No  . Drug use: No  . Sexual activity: No   Other Topics Concern  . Not on file   Social History Narrative  . No narrative on file    Family History  Problem Relation Age of Onset  . Heart  attack Mother     Dec 1987  . CVA Mother   . Hypertension Mother   . Multiple myeloma Mother   . Breast cancer Sister   . Skin cancer Sister   . Prostate cancer Brother   . Throat cancer Brother   . Cervical cancer Daughter   . Leukemia Daughter   . Leukemia Son   . Throat cancer Brother   . Cancer Brother     soft palette  . Heart Problems Brother     Pacemaker  . Colon cancer Neg Hx   . Pancreatic cancer Neg Hx  ROS- All systems are reviewed and negative except as per the HPI above  Physical Exam: Vitals:   10/15/16 1118  BP: (!) 86/50  Pulse: 73  Weight: 242 lb 9.6 oz (110 kg)  Height: 5' 4"  (1.626 m)   Wt Readings from Last 3 Encounters:  10/15/16 242 lb 9.6 oz (110 kg)  10/10/16 233 lb 9.6 oz (106 kg)  10/07/16 242 lb (109.8 kg)    Labs: Lab Results  Component Value Date   NA 140 10/15/2016   K 4.1 10/15/2016   CL 102 10/15/2016   CO2 30 10/15/2016   GLUCOSE 110 (H) 10/15/2016   BUN 14 10/15/2016   CREATININE 0.83 10/15/2016   CALCIUM 8.9 10/15/2016   PHOS 4.2 09/02/2016   MG 2.0 10/15/2016   Lab Results  Component Value Date   INR 1.30 07/22/2016   No results found for: CHOL, HDL, LDLCALC, TRIG   GEN- The patient is well appearing, alert and oriented x 3 today.   Head- normocephalic, atraumatic Eyes-  Sclera clear, conjunctiva pink Ears- hearing intact Oropharynx- clear Neck- supple, no JVP Lymph- no cervical lymphadenopathy Lungs- Clear to ausculation bilaterally, normal work of breathing Heart- Regular rate and rhythm, no murmurs, rubs or gallops, PMI not laterally displaced GI- soft, NT, ND, + BS Extremities- no clubbing, cyanosis, or edema MS- no significant deformity or atrophy Skin- no rash or lesion Psych- euthymic mood, full affect Neuro- strength and sensation are intact  EKG- NSR at 73 bpm, pr int 124 ms, qtc 438 ms(stable) Epic records reviewed    Assessment and Plan: 1. Afib Maintaining SR on tikosyn Continue  dofetilide 500 mg bid QTc prolonging drugs reviewed Continue eliquis  2. LLE Elevate legs when sitting Knee high compression socks Weigh daily Weight up 5 lbs Avoid salt Call if edema does not improve   F/u Dr. Lovena Le 4/3 afib clinc as needed  Geroge Baseman. Ovie Eastep, [facility] [address] [place], [place] [address] [phone]

## 2016-10-16 ENCOUNTER — Encounter (HOSPITAL_COMMUNITY): Payer: Medicare Other | Admitting: Nurse Practitioner

## 2016-10-16 DIAGNOSIS — F329 Major depressive disorder, single episode, unspecified: Secondary | ICD-10-CM | POA: Diagnosis not present

## 2016-10-16 DIAGNOSIS — E669 Obesity, unspecified: Secondary | ICD-10-CM | POA: Diagnosis not present

## 2016-10-16 DIAGNOSIS — I5030 Unspecified diastolic (congestive) heart failure: Secondary | ICD-10-CM | POA: Diagnosis not present

## 2016-10-16 DIAGNOSIS — Z433 Encounter for attention to colostomy: Secondary | ICD-10-CM | POA: Diagnosis not present

## 2016-10-16 DIAGNOSIS — I481 Persistent atrial fibrillation: Secondary | ICD-10-CM | POA: Diagnosis not present

## 2016-10-16 DIAGNOSIS — I110 Hypertensive heart disease with heart failure: Secondary | ICD-10-CM | POA: Diagnosis not present

## 2016-10-18 NOTE — Progress Notes (Addendum)
Tricia Hernandez 83 y.o. 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   Nausea/ Vomiting:Nausea when had a UTI last week better now UTI resolved. Diarrhea:None,having normal bowel movements via colostomy bag one to two times daily Skin irritation:Right buttock healing.  Passed some blood from her rectum last Wednesday small amount when she got up that morning and when she went to bed that night.  Passed clear jelly like secretion from her rectum this afternoon. Pain: None Fatigue:Having mild fatigue all the times if she is up and walking, taking naps Weight:: Wt Readings from Last 3 Encounters:  10/29/16 239 lb 9.6 oz (108.7 kg)  10/15/16 242 lb 9.6 oz (110 kg)  10/10/16 233 lb 9.6 oz (106 kg)  BP 131/61   Pulse (!) 57   Temp 97.9 F (36.6 C) (Oral)   Resp 18   Ht '5\' 4"'$  (1.626 m)   Wt 239 lb 9.6 oz (108.7 kg)   SpO2 98%   BMI 41.13 kg/m

## 2016-10-21 ENCOUNTER — Telehealth: Payer: Self-pay | Admitting: Internal Medicine

## 2016-10-21 ENCOUNTER — Ambulatory Visit: Payer: Medicare Other | Admitting: Nurse Practitioner

## 2016-10-21 ENCOUNTER — Inpatient Hospital Stay: Admission: RE | Admit: 2016-10-21 | Payer: Self-pay | Source: Ambulatory Visit | Admitting: Radiation Oncology

## 2016-10-21 NOTE — Telephone Encounter (Signed)
New message   *STAT* If patient is at the pharmacy, call can be transferred to refill team.   1. Which medications need to be refilled? (please list name of each medication and dose if known) PARoxetine (PAXIL) 20 MG tablet  2. Which pharmacy/location (including street and city if local pharmacy) is medication to be sent to? [facility] on [address] in [place]  3. Do they need a 30 day or 90 day supply? 90 day supply

## 2016-10-23 DIAGNOSIS — F329 Major depressive disorder, single episode, unspecified: Secondary | ICD-10-CM | POA: Diagnosis not present

## 2016-10-23 DIAGNOSIS — I110 Hypertensive heart disease with heart failure: Secondary | ICD-10-CM | POA: Diagnosis not present

## 2016-10-23 DIAGNOSIS — E669 Obesity, unspecified: Secondary | ICD-10-CM | POA: Diagnosis not present

## 2016-10-23 DIAGNOSIS — I481 Persistent atrial fibrillation: Secondary | ICD-10-CM | POA: Diagnosis not present

## 2016-10-23 DIAGNOSIS — I5030 Unspecified diastolic (congestive) heart failure: Secondary | ICD-10-CM | POA: Diagnosis not present

## 2016-10-23 DIAGNOSIS — Z433 Encounter for attention to colostomy: Secondary | ICD-10-CM | POA: Diagnosis not present

## 2016-10-24 DIAGNOSIS — R809 Proteinuria, unspecified: Secondary | ICD-10-CM | POA: Diagnosis not present

## 2016-10-24 DIAGNOSIS — R3129 Other microscopic hematuria: Secondary | ICD-10-CM | POA: Diagnosis not present

## 2016-10-24 DIAGNOSIS — R8290 Unspecified abnormal findings in urine: Secondary | ICD-10-CM | POA: Diagnosis not present

## 2016-10-24 NOTE — Telephone Encounter (Signed)
Needs to go to PCP

## 2016-10-24 NOTE — Telephone Encounter (Signed)
This message hasn't been addressed. I do not see where Dr Lovena Le has ever filled paxil for the patient. Is this okay to refill? Please advise. Thanks, MI

## 2016-10-24 NOTE — Telephone Encounter (Signed)
Spoke with patients daughter, Collie Siad and made her aware to call PCP for paxil as this is not a cardiac med. She stated that they have already picked a ninety day supply of this med up at the pharmacy but she is not sure who filled it . She stated that the patient has been diagnosed with a UTI and the physician wants to prescribe bactrim ds but she would like to know if this safe to take with the tikosyn. Collie Siad can be reached at [phone]. Thanks, MI

## 2016-10-25 DIAGNOSIS — I481 Persistent atrial fibrillation: Secondary | ICD-10-CM | POA: Diagnosis not present

## 2016-10-25 DIAGNOSIS — E669 Obesity, unspecified: Secondary | ICD-10-CM | POA: Diagnosis not present

## 2016-10-25 DIAGNOSIS — I5030 Unspecified diastolic (congestive) heart failure: Secondary | ICD-10-CM | POA: Diagnosis not present

## 2016-10-25 DIAGNOSIS — Z433 Encounter for attention to colostomy: Secondary | ICD-10-CM | POA: Diagnosis not present

## 2016-10-25 DIAGNOSIS — I110 Hypertensive heart disease with heart failure: Secondary | ICD-10-CM | POA: Diagnosis not present

## 2016-10-25 DIAGNOSIS — F329 Major depressive disorder, single episode, unspecified: Secondary | ICD-10-CM | POA: Diagnosis not present

## 2016-10-25 NOTE — Telephone Encounter (Addendum)
LMOM for pt's daughter. Bactrim is contraindicated with Tikosyn and pt will need alternative antibiotic for UTI. Fluoroquinolones contraindicated as well d/t QTc prolongation and Macrobid is not recommended in elderly patients. She will likely need fosfomycin.   Pt called back and reported that pt's doctor already sent in a prescription for Macrobid. Explained to pt's daughter that there is no contraindication with Tikosyn but generally isn't used in elderly patients.

## 2016-10-28 DIAGNOSIS — Z433 Encounter for attention to colostomy: Secondary | ICD-10-CM | POA: Diagnosis not present

## 2016-10-28 DIAGNOSIS — I481 Persistent atrial fibrillation: Secondary | ICD-10-CM | POA: Diagnosis not present

## 2016-10-28 DIAGNOSIS — I110 Hypertensive heart disease with heart failure: Secondary | ICD-10-CM | POA: Diagnosis not present

## 2016-10-28 DIAGNOSIS — E669 Obesity, unspecified: Secondary | ICD-10-CM | POA: Diagnosis not present

## 2016-10-28 DIAGNOSIS — F329 Major depressive disorder, single episode, unspecified: Secondary | ICD-10-CM | POA: Diagnosis not present

## 2016-10-28 DIAGNOSIS — I5030 Unspecified diastolic (congestive) heart failure: Secondary | ICD-10-CM | POA: Diagnosis not present

## 2016-10-29 ENCOUNTER — Ambulatory Visit
Admission: RE | Admit: 2016-10-29 | Discharge: 2016-10-29 | Disposition: A | Discharge status: Home / Self Care | Payer: Medicare Other | Source: Ambulatory Visit | Attending: Radiation Oncology | Admitting: Radiation Oncology

## 2016-10-29 ENCOUNTER — Encounter: Payer: Self-pay | Admitting: Radiation Oncology

## 2016-10-29 ENCOUNTER — Telehealth: Payer: Self-pay | Admitting: Urology

## 2016-10-29 VITALS — BP 131/61 | HR 57 | Temp 97.9°F | Resp 18 | Ht 64.0 in | Wt 239.6 lb

## 2016-10-29 DIAGNOSIS — Z888 Allergy status to other drugs, medicaments and biological substances status: Secondary | ICD-10-CM | POA: Insufficient documentation

## 2016-10-29 DIAGNOSIS — Z79899 Other long term (current) drug therapy: Secondary | ICD-10-CM | POA: Insufficient documentation

## 2016-10-29 DIAGNOSIS — Z923 Personal history of irradiation: Secondary | ICD-10-CM | POA: Insufficient documentation

## 2016-10-29 DIAGNOSIS — Z7901 Long term (current) use of anticoagulants: Secondary | ICD-10-CM | POA: Diagnosis not present

## 2016-10-29 DIAGNOSIS — C3400 Malignant neoplasm of unspecified main bronchus: Secondary | ICD-10-CM

## 2016-10-29 DIAGNOSIS — Z882 Allergy status to sulfonamides status: Secondary | ICD-10-CM | POA: Insufficient documentation

## 2016-10-29 DIAGNOSIS — C211 Malignant neoplasm of anal canal: Secondary | ICD-10-CM | POA: Diagnosis not present

## 2016-10-29 DIAGNOSIS — Z885 Allergy status to narcotic agent status: Secondary | ICD-10-CM | POA: Insufficient documentation

## 2016-10-29 DIAGNOSIS — Z886 Allergy status to analgesic agent status: Secondary | ICD-10-CM | POA: Insufficient documentation

## 2016-10-29 DIAGNOSIS — Z933 Colostomy status: Secondary | ICD-10-CM | POA: Insufficient documentation

## 2016-10-29 DIAGNOSIS — Z880 Allergy status to penicillin: Secondary | ICD-10-CM | POA: Insufficient documentation

## 2016-10-29 DIAGNOSIS — C210 Malignant neoplasm of anus, unspecified: Secondary | ICD-10-CM

## 2016-10-29 MED ORDER — BIAFINE EX EMUL
Freq: Every day | CUTANEOUS | Status: DC
  Administered 2016-10-29: 16:00:00 via TOPICAL

## 2016-10-29 NOTE — Progress Notes (Signed)
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 She is accomanied by her daughter, Collie Siad.  She has completed definitive radiotherapy for her squamous cell carcinoma of the anal canal.  She did have difficulty with a yeast infection and UTI during her course of treatment, both of which resolved with medications.  She also experienced fatigue and difficulty sleeping. She did have improvement with Ambien. She did develop some mild hyperpigmentation below the colostomy bag without desquamation.               She had a PET/CT scan on 09/16/2016 for further evaluation of the lung nodules noted on prior CT chest. This noted focal anal activity with SUV of 13.3. The inguinal lymph nodes were decreased in size from 2.4 cm to 1.5 cm with low-grade metabolic activity, favoring response to therapy. There was faint nodularity in the lungs, similar to prior study from the SUV of 1.8. There was significant reduction in size of a perirectal lymph node.  She has consulted with Dr. Lindi Adie in medical oncology who, after reviewing PET CT and workup to date, has recommended and ordered a repeat CT chest/abdomen/pelvis in 3 months, at which time she will follow-up with him for further discussion.    She has an appointment with Dr. Vaughan Browner in Pulmonology on 11/04/16 to establish care and a follow up appointment with her surgeon, Dr. Hulen Skains on 11/05/16.   Follow up appointment with Dr. Lindi Adie is scheduled for 12/31/16.  On review of systems, the patient states that  she is doing well overall recovering from the effects of radiotherapy. Her fatigue is gradually improving and she is slowly regaining strength.  She has a colostomy bag in place which continues to drain normal/loose stool.  They are changing the colostomy bag regularly and deny any skin breakdown or irritation at the colostomy site. The skin on her buttocks is healing well- using Biafine cream.  She has not had any rectal pain. She did have an episode of blood from her rectum 1 week ago.  Her daughter noticed scant blood on the washcloth when cleaning her in the morning and again that evening but has not seen any further.  Otherwise, she has had some occasional clear mucous secretions from the rectum. They have not noted any blood in the colostomy bag. She denies fever, chills, night sweats, chest pain, shortness of breath, nausea or vomiting.  Her appetite is good and she is maintaining her weight.  ALLERGIES:  is allergic to penicillins; aleve [naproxen sodium]; antihistamines, chlorpheniramine-type; aspirin; codeine; hydrochlorothiazide; rocephin [ceftriaxone sodium in dextrose]; and sulfa antibiotics.  Meds: Current Outpatient Prescriptions  Medication Sig Dispense Refill  . apixaban (ELIQUIS) 5 MG TABS tablet Take 5 mg by mouth 2 (two) times daily.    Marland Kitchen atorvastatin (LIPITOR) 20 MG tablet Take 20 mg by mouth daily.    . budesonide (PULMICORT) 0.25 MG/2ML nebulizer solution Take 2 mLs (0.25 mg total) by nebulization 2 (two) times daily. 60 mL 12  .  Calcium 600-200 MG-UNIT tablet Take 1 tablet by mouth daily.    Marland Kitchen dofetilide (TIKOSYN) 500 MCG capsule Take 1 capsule (500 mcg total) by mouth 2 (two) times daily. 180 capsule 3  . emollient (BIAFINE) cream Apply topically as needed. Apply cream to area of skin breakdown in groin folds. Please apply at night before bed. Please wash off with AM shower and do not reapply before radiation therapy.    . ENSURE PLUS (ENSURE PLUS) LIQD Take 237 mLs by mouth 3  (three) times daily between meals.    . fenofibrate 160 MG tablet Take 160 mg by mouth daily.    . ferrous sulfate 325 (65 FE) MG tablet Take 325 mg by mouth 2 (two) times daily with a meal.    . fluticasone (FLONASE) 50 MCG/ACT nasal spray Place 1 spray into both nostrils daily.    Marland Kitchen ipratropium-albuterol (DUONEB) 0.5-2.5 (3) MG/3ML SOLN Take 3 mLs by nebulization 2 (two) times daily.    . magnesium oxide (MAG-OX) 400 (241.3 Mg) MG tablet Take 1 tablet (400 mg total) by mouth 2 (two) times daily. 60 tablet 1  . Metoprolol Tartrate (LOPRESSOR) 25 MG tablet Take 1 tablet (25 mg total) by mouth 2 (two) times daily. 60 tablet 1  . montelukast (SINGULAIR) 10 MG tablet Take 10 mg by mouth daily.    Marland Kitchen PARoxetine (PAXIL) 20 MG tablet Take 20 mg by mouth at bedtime.      No current facility-administered medications for this encounter.     Physical Findings:  vitals were not taken for this visit.  0/10 In general this is a well appearing caucasian female in no acute distress. She's alert and oriented x4 and appropriate throughout the examination. Cardiopulmonary assessment is negative for acute distress and she exhibits normal effort.  Her skin is healing well with only mild dry desquamation and erythema remaining on the buttocks. There is no rectal drainage, bleeding or mass visualized. Rectal exam was deferred as she has follow up with her surgeon next week. The colostomy site is clean and without signs of infection.  Colostomy is draining appropriately with dark brown stool present.    Lab Findings: Lab Results  Component Value Date   WBC 12.1 (H) 09/02/2016   HGB 10.3 (L) 09/02/2016   HCT 33.6 (L) 09/02/2016   MCV 79.8 09/02/2016   PLT 169 09/02/2016     Radiographic Findings: No results found.  Impression/Plan: 1. Stage T3 N3 MX (possible lung metastases) squamous cell carcinoma of the anal canal - Stage IIIB vs IV.  The patient appears to be doing well following completion of  radiotherapy. She will continue with Biafine cream to the buttocks daily for an additional week and then will transition over to Aquaphor prn.  We will plan to follow up in 3 months or sooner pending findings on follow up CT Chest/Abd/Pelvis ordered by Dr. Lindi Adie.  Since she travels from [place], [place], we will try and coordinate her follow-up appointment in our office on the same date as follow up with medical oncology. In the interim, she knows to contact us with any questions or concerns related to her previous radiotherapy.    Nicholos Johns, PA-C

## 2016-10-30 ENCOUNTER — Telehealth: Payer: Self-pay | Admitting: *Deleted

## 2016-10-30 ENCOUNTER — Institutional Professional Consult (permissible substitution): Payer: Medicare Other | Admitting: Pulmonary Disease

## 2016-10-30 NOTE — Telephone Encounter (Signed)
CALLED PATIENT TO INFORM OF FU VISIT WITH DR. Isidore Moos ON 12-31-16 @ 9:15 AM, LVM FOR A RETURN CALL

## 2016-10-31 DIAGNOSIS — I481 Persistent atrial fibrillation: Secondary | ICD-10-CM | POA: Diagnosis not present

## 2016-10-31 DIAGNOSIS — E669 Obesity, unspecified: Secondary | ICD-10-CM | POA: Diagnosis not present

## 2016-10-31 DIAGNOSIS — F329 Major depressive disorder, single episode, unspecified: Secondary | ICD-10-CM | POA: Diagnosis not present

## 2016-10-31 DIAGNOSIS — Z433 Encounter for attention to colostomy: Secondary | ICD-10-CM | POA: Diagnosis not present

## 2016-10-31 DIAGNOSIS — I110 Hypertensive heart disease with heart failure: Secondary | ICD-10-CM | POA: Diagnosis not present

## 2016-10-31 DIAGNOSIS — I5030 Unspecified diastolic (congestive) heart failure: Secondary | ICD-10-CM | POA: Diagnosis not present

## 2016-11-01 DIAGNOSIS — E669 Obesity, unspecified: Secondary | ICD-10-CM | POA: Diagnosis not present

## 2016-11-01 DIAGNOSIS — I110 Hypertensive heart disease with heart failure: Secondary | ICD-10-CM | POA: Diagnosis not present

## 2016-11-01 DIAGNOSIS — Z433 Encounter for attention to colostomy: Secondary | ICD-10-CM | POA: Diagnosis not present

## 2016-11-01 DIAGNOSIS — F329 Major depressive disorder, single episode, unspecified: Secondary | ICD-10-CM | POA: Diagnosis not present

## 2016-11-01 DIAGNOSIS — I481 Persistent atrial fibrillation: Secondary | ICD-10-CM | POA: Diagnosis not present

## 2016-11-01 DIAGNOSIS — I5030 Unspecified diastolic (congestive) heart failure: Secondary | ICD-10-CM | POA: Diagnosis not present

## 2016-11-01 NOTE — Telephone Encounter (Signed)
Error

## 2016-11-04 ENCOUNTER — Other Ambulatory Visit (INDEPENDENT_AMBULATORY_CARE_PROVIDER_SITE_OTHER): Payer: Medicare Other

## 2016-11-04 ENCOUNTER — Ambulatory Visit (INDEPENDENT_AMBULATORY_CARE_PROVIDER_SITE_OTHER): Payer: Medicare Other | Admitting: Pulmonary Disease

## 2016-11-04 ENCOUNTER — Encounter: Payer: Self-pay | Admitting: Nurse Practitioner

## 2016-11-04 ENCOUNTER — Encounter: Payer: Self-pay | Admitting: Pulmonary Disease

## 2016-11-04 ENCOUNTER — Ambulatory Visit (INDEPENDENT_AMBULATORY_CARE_PROVIDER_SITE_OTHER): Payer: Medicare Other | Admitting: Nurse Practitioner

## 2016-11-04 VITALS — BP 104/58 | HR 69 | Temp 98.5°F | Ht 64.0 in | Wt 241.0 lb

## 2016-11-04 VITALS — BP 106/60 | HR 56 | Ht 64.0 in | Wt 240.6 lb

## 2016-11-04 DIAGNOSIS — D649 Anemia, unspecified: Secondary | ICD-10-CM | POA: Diagnosis not present

## 2016-11-04 DIAGNOSIS — J42 Unspecified chronic bronchitis: Secondary | ICD-10-CM | POA: Diagnosis not present

## 2016-11-04 DIAGNOSIS — R0602 Shortness of breath: Secondary | ICD-10-CM | POA: Diagnosis not present

## 2016-11-04 DIAGNOSIS — D500 Iron deficiency anemia secondary to blood loss (chronic): Secondary | ICD-10-CM

## 2016-11-04 DIAGNOSIS — R8290 Unspecified abnormal findings in urine: Secondary | ICD-10-CM | POA: Diagnosis not present

## 2016-11-04 DIAGNOSIS — R918 Other nonspecific abnormal finding of lung field: Secondary | ICD-10-CM

## 2016-11-04 DIAGNOSIS — E782 Mixed hyperlipidemia: Secondary | ICD-10-CM

## 2016-11-04 DIAGNOSIS — I10 Essential (primary) hypertension: Secondary | ICD-10-CM | POA: Diagnosis not present

## 2016-11-04 LAB — IRON AND TIBC
%SAT: 13 % (ref 11–50)
Iron: 54 ug/dL (ref 45–160)
TIBC: 403 ug/dL (ref 250–450)
UIBC: 349 ug/dL (ref 125–400)

## 2016-11-04 LAB — CBC WITH DIFFERENTIAL/PLATELET
BASOS ABS: 0 10*3/uL (ref 0.0–0.1)
Basophils Relative: 0.4 % (ref 0.0–3.0)
Eosinophils Absolute: 0 10*3/uL (ref 0.0–0.7)
Eosinophils Relative: 0.8 % (ref 0.0–5.0)
HEMATOCRIT: 31.3 % — AB (ref 36.0–46.0)
Hemoglobin: 9.9 g/dL — ABNORMAL LOW (ref 12.0–15.0)
LYMPHS ABS: 1.3 10*3/uL (ref 0.7–4.0)
LYMPHS PCT: 20.5 % (ref 12.0–46.0)
MCHC: 31.6 g/dL (ref 30.0–36.0)
MCV: 82.5 fl (ref 78.0–100.0)
MONOS PCT: 11 % (ref 3.0–12.0)
Monocytes Absolute: 0.7 10*3/uL (ref 0.1–1.0)
NEUTROS ABS: 4.2 10*3/uL (ref 1.4–7.7)
NEUTROS PCT: 67.3 % (ref 43.0–77.0)
PLATELETS: 249 10*3/uL (ref 150.0–400.0)
RBC: 3.79 Mil/uL — ABNORMAL LOW (ref 3.87–5.11)
RDW: 25.2 % — AB (ref 11.5–15.5)
WBC: 6.3 10*3/uL (ref 4.0–10.5)

## 2016-11-04 LAB — LIPID PANEL
CHOL/HDL RATIO: 3
CHOLESTEROL: 144 mg/dL (ref 0–200)
HDL: 46.2 mg/dL (ref >39.00)
LDL CALC: 69 mg/dL (ref 0–99)
NonHDL: 97.57
TRIGLYCERIDES: 145 mg/dL (ref 0.0–149.0)
VLDL: 29 mg/dL (ref 0.0–40.0)

## 2016-11-04 LAB — FERRITIN: Ferritin: 44.2 ng/mL (ref 10.0–291.0)

## 2016-11-04 MED ORDER — IPRATROPIUM-ALBUTEROL 0.5-2.5 (3) MG/3ML IN SOLN: 3.0000 mL | Freq: Two times a day (BID) | RESPIRATORY_TRACT | 1 refills | Status: DC

## 2016-11-04 MED ORDER — BUDESONIDE 0.25 MG/2ML IN SUSP: 0.2500 mg | Freq: Two times a day (BID) | RESPIRATORY_TRACT | 12 refills | Status: DC

## 2016-11-04 MED ORDER — MONTELUKAST SODIUM 10 MG PO TABS: 10.0000 mg | ORAL_TABLET | Freq: Every day | ORAL | 1 refills | Status: DC

## 2016-11-04 MED ORDER — ATORVASTATIN CALCIUM 20 MG PO TABS: 20.0000 mg | ORAL_TABLET | Freq: Every day | ORAL | 1 refills | Status: DC

## 2016-11-04 MED ORDER — FENOFIBRATE 160 MG PO TABS: 160.0000 mg | ORAL_TABLET | Freq: Every day | ORAL | 1 refills | Status: DC

## 2016-11-04 NOTE — Progress Notes (Signed)
Subjective:  Patient ID: Tricia Hernandez, female    DOB: [birth year]  Age: 83 y.o. MRN: [id number]  CC: Establish Care (est care/lab consult/vitamin consult,meds refill. req magnesium oxid lab? Tdap?)   HPI  Previous pcp in [place], last seen summer 2017. Home in [place], [place]. Lives alone. Has home PT at this time (2-3times a week) Has adult daughter checks on her daily, helps with some ADLs.  Rectal Cancer: Resection 07/2016 Completed radiation 09/25/2016 Has colostomy bag at this time (permanent vs temporal?).  Recurrent UTI secondary to urinary incontinence and use of incontinent pads. Dr. Caren Griffins [facility]: manages recurrent UTI and annual mammogram.  A-Fib: converted to NSR with use of Tikosyn. Current use of eliquis. Denies any signs of bleeding or abnormal bruising. Managed by cardiologist: Dr. Lovena Le.  Outpatient Medications Prior to Visit  Medication Sig Dispense Refill  . apixaban (ELIQUIS) 5 MG TABS tablet Take 5 mg by mouth 2 (two) times daily.    . Calcium 600-200 MG-UNIT tablet Take 1 tablet by mouth daily.    Marland Kitchen dofetilide (TIKOSYN) 500 MCG capsule Take 1 capsule (500 mcg total) by mouth 2 (two) times daily. 180 capsule 3  . ENSURE PLUS (ENSURE PLUS) LIQD Take 237 mLs by mouth 3 (three) times daily between meals.    . fluticasone (FLONASE) 50 MCG/ACT nasal spray Place 1 spray into both nostrils daily.    . magnesium oxide (MAG-OX) 400 (241.3 Mg) MG tablet Take 1 tablet (400 mg total) by mouth 2 (two) times daily. 60 tablet 1  . Metoprolol Tartrate (LOPRESSOR) 25 MG tablet Take 1 tablet (25 mg total) by mouth 2 (two) times daily. 60 tablet 1  . PARoxetine (PAXIL) 20 MG tablet Take 20 mg by mouth at bedtime.     Marland Kitchen atorvastatin (LIPITOR) 20 MG tablet Take 20 mg by mouth daily.    . budesonide (PULMICORT) 0.25 MG/2ML nebulizer solution Take 2 mLs (0.25 mg total) by nebulization 2 (two) times daily. 60 mL 12  . fenofibrate 160 MG tablet  Take 160 mg by mouth daily.    . ferrous sulfate 325 (65 FE) MG tablet Take 325 mg by mouth 2 (two) times daily with a meal.    . ipratropium-albuterol (DUONEB) 0.5-2.5 (3) MG/3ML SOLN Take 3 mLs by nebulization 2 (two) times daily.    . montelukast (SINGULAIR) 10 MG tablet Take 10 mg by mouth daily.     No facility-administered medications prior to visit.     ROS Review of Systems  Constitutional: Negative for fever, malaise/fatigue and weight loss.  HENT: Positive for hearing loss. Negative for congestion and sore throat.        Chronic hearing loss (has hearing aids)  Eyes:       Negative for visual changes  Respiratory: Negative for cough and shortness of breath.   Cardiovascular: Negative for chest pain, palpitations and leg swelling.  Gastrointestinal: Negative for blood in stool, constipation, diarrhea, heartburn and nausea.  Genitourinary: Negative for dysuria, frequency and urgency.  Musculoskeletal: Negative for falls, joint pain and myalgias.  Skin: Negative for rash.  Neurological: Negative for dizziness, sensory change and headaches.  Endo/Heme/Allergies: Does not bruise/bleed easily.  Psychiatric/Behavioral: Negative for depression, substance abuse and suicidal ideas. The patient is not nervous/anxious.     Objective:  BP (!) 104/58   Pulse 69   Temp 98.5 F (36.9 C)   Ht '5\' 4"'$  (1.626 m)   Wt 241 lb (109.3 kg)  SpO2 98%   BMI 41.37 kg/m   BP Readings from Last 3 Encounters:  11/04/16 106/60  11/04/16 (!) 104/58  10/29/16 131/61    Wt Readings from Last 3 Encounters:  11/04/16 240 lb 9.6 oz (109.1 kg)  11/04/16 241 lb (109.3 kg)  10/29/16 239 lb 9.6 oz (108.7 kg)    Physical Exam  Constitutional: She is oriented to person, place, and time. No distress.  Cardiovascular: Normal rate and normal heart sounds.   Pulmonary/Chest: Effort normal and breath sounds normal.  Abdominal: Soft.  Musculoskeletal: She exhibits edema.  Neurological: She is alert  and oriented to person, place, and time.  Skin: Skin is warm and dry.  Vitals reviewed.   Lab Results  Component Value Date   WBC 6.3 11/04/2016   HGB 9.9 (L) 11/04/2016   HCT 31.3 (L) 11/04/2016   PLT 249.0 11/04/2016   GLUCOSE 110 (H) 10/15/2016   CHOL 144 11/04/2016   TRIG 145.0 11/04/2016   HDL 46.20 11/04/2016   LDLCALC 69 11/04/2016   ALT 17 09/02/2016   AST 19 09/02/2016   NA 140 10/15/2016   K 4.1 10/15/2016   CL 102 10/15/2016   CREATININE 0.83 10/15/2016   BUN 14 10/15/2016   CO2 30 10/15/2016   INR 1.30 07/22/2016    No results found.  Assessment & Plan:   Terriana was seen today for establish care.  Diagnoses and all orders for this visit:  Essential hypertension  Iron deficiency anemia due to chronic blood loss -     CBC w/Diff; Future -     Ferritin; Future -     Iron Binding Cap (TIBC); Future -     ferrous sulfate 325 (65 FE) MG tablet; Take 1 tablet (325 mg total) by mouth daily with breakfast.  Mixed hyperlipidemia -     Lipid panel; Future -     CK Total (and CKMB); Future -     atorvastatin (LIPITOR) 20 MG tablet; Take 1 tablet (20 mg total) by mouth daily. -     fenofibrate 160 MG tablet; Take 1 tablet (160 mg total) by mouth daily.  Chronic bronchitis, unspecified chronic bronchitis type (HCC) -     montelukast (SINGULAIR) 10 MG tablet; Take 1 tablet (10 mg total) by mouth at bedtime.   I have changed Ms. Grega's atorvastatin, fenofibrate, montelukast, and ferrous sulfate. I am also having her maintain her apixaban, fluticasone, PARoxetine, ENSURE PLUS, Calcium, dofetilide, magnesium oxide, and metoprolol tartrate.  Meds ordered this encounter  Medications  . atorvastatin (LIPITOR) 20 MG tablet    Sig: Take 1 tablet (20 mg total) by mouth daily.    Dispense:  90 tablet    Refill:  1    Order Specific Question:   Supervising Provider    Answer:   Cassandria Anger [[id number]]  . fenofibrate 160 MG tablet    Sig: Take 1 tablet (160 mg  total) by mouth daily.    Dispense:  90 tablet    Refill:  1    Order Specific Question:   Supervising Provider    Answer:   Cassandria Anger [[id number]]  . montelukast (SINGULAIR) 10 MG tablet    Sig: Take 1 tablet (10 mg total) by mouth at bedtime.    Dispense:  90 tablet    Refill:  1    Order Specific Question:   Supervising Provider    Answer:   Cassandria Anger [[id number]]  . ferrous sulfate 325 (65  FE) MG tablet    Sig: Take 1 tablet (325 mg total) by mouth daily with breakfast.    Dispense:  90 tablet    Refill:  1    Order Specific Question:   Supervising Provider    Answer:   Cassandria Anger [[id number]]    Follow-up: Return in about 3 months (around 02/04/2017) for HTN, hyperlipidemia.  Wilfred Lacy, NP

## 2016-11-04 NOTE — Patient Instructions (Signed)
Your CT scan and PET scan results were reviewed with you today We can follow the lung nodules on the scheduled follow-up scans We will schedule you for pulmonary function test Continue using the inhalers. Will call in refills for Brovana and DuoNeb's  Return to clinic in 3 months.

## 2016-11-04 NOTE — Progress Notes (Signed)
Pre visit review using our clinic review tool, if applicable. No additional management support is needed unless otherwise documented below in the visit note. 

## 2016-11-04 NOTE — Addendum Note (Signed)
Addended by: Maryanna Shape A on: 11/04/2016 03:43 PM   Modules accepted: Orders

## 2016-11-04 NOTE — Progress Notes (Signed)
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 She used to follow [facility], Pulmonologist at [facility]. 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 Work up included CT scan of the chest and PET scan which shows non avid stable lung nodules. She was recently evaluated for uncontrolled atrial fibrillation and loaded with [facility]. She also had an episode of pneumonia in January for which she was admitted and treated with steroids and antibiotics. She has a remote smoking history of about 5 pack years. Quit in 1975. 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 There are no known exposures at work or at home.  Outpatient Encounter Prescriptions as of 11/04/2016  Medication Sig  . apixaban (ELIQUIS) 5 MG TABS tablet Take 5 mg by mouth 2 (two) times daily.  Marland Kitchen atorvastatin (LIPITOR) 20 MG tablet Take 20 mg by mouth daily.  . budesonide (PULMICORT) 0.25 MG/2ML nebulizer solution Take 2 mLs (0.25 mg total) by nebulization 2 (two) times daily.  . Calcium 600-200 MG-UNIT tablet Take 1 tablet by mouth daily.  Marland Kitchen dofetilide ([facility]) 500 MCG capsule Take 1 capsule (500 mcg total) by mouth 2 (two) times daily.  Marland Kitchen ENSURE PLUS (ENSURE PLUS) LIQD Take 237 mLs by mouth 3 (three) times daily between meals.  . fenofibrate 160 MG tablet Take 160 mg by mouth daily.  . ferrous sulfate 325 (65 FE) MG tablet Take 325 mg by mouth 2 (two) times daily with a meal.    . fluticasone (FLONASE) 50 MCG/ACT nasal spray Place 1 spray into both nostrils daily.  Marland Kitchen ipratropium-albuterol (DUONEB) 0.5-2.5 (3) MG/3ML SOLN Take 3 mLs by nebulization 2 (two) times daily.  . magnesium oxide (MAG-OX) 400 (241.3 Mg) MG tablet Take 1 tablet (400 mg total) by mouth 2 (two) times daily.  . Metoprolol Tartrate (LOPRESSOR) 25 MG tablet Take 1 tablet (25 mg total) by mouth 2 (two) times daily.  . montelukast (SINGULAIR) 10 MG tablet Take 10 mg by mouth daily.  Marland Kitchen PARoxetine (PAXIL) 20 MG tablet Take 20 mg by mouth at bedtime.    No facility-administered encounter medications on file as of 11/04/2016.     Allergies as of 11/04/2016 - Review Complete 11/04/2016  Allergen Reaction Noted  . Penicillins Anaphylaxis 06/14/2016  . Benadryl [diphenhydramine hcl]  10/29/2016  . Aleve [naproxen sodium] Hives 07/22/2016  . Antihistamines, chlorpheniramine-type Rash   . Aspirin Rash 06/14/2016  . Codeine Rash 06/14/2016  . Hydrochlorothiazide Rash 07/22/2016  . Rocephin [ceftriaxone sodium in dextrose] Rash 06/14/2016  . Sulfa antibiotics Rash 06/14/2016    Past Medical History:  Diagnosis Date  . 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 Anemia   . Anxiety   . Atrial fibrillation ([place])   . Basal cell carcinoma of skin of nose   . CHF (congestive heart failure) ([place]) 11/2015  . Chronic bronchitis ([place])   .  Chronic depression   . Colostomy in place [facility])   . Diverticular disease   . Fibrocystic disease of breast   . Fracture of shoulder   . GERD (gastroesophageal reflux disease)   . Herpes zoster   . History of blood transfusion    "related to emergent hysterectomy"  . History of hiatal hernia   . Hyperlipidemia   . Hypertension   . Labyrinthitis   . Lung cancer ([place])    RML  . Obesity   . Paroxysmal supraventricular tachycardia ([place])   . Peptic ulcer of stomach    hx  . Pneumonia 1980s X 1; 08/2016   "walking;  double"  . Pulmonary embolism ([place]) 05/2014   "left lung"  . Vitamin D deficiency     Past Surgical History:  Procedure Laterality Date  . APPENDECTOMY  1959  . BASAL CELL CARCINOMA EXCISION     nose  . BREAST CYST ASPIRATION Right 2016?  Marland Kitchen CATARACT EXTRACTION W/ INTRAOCULAR LENS  IMPLANT, BILATERAL Bilateral 1990s  . CHOLECYSTECTOMY OPEN  1980s  . COLON SURGERY    . COLOSTOMY N/A 07/29/2016   Procedure: COLOSTOMY;  Surgeon: Clovis Riley, MD;  Location: [place];  Service: General;  Laterality: N/A;  . DILATION AND CURETTAGE OF UTERUS    . EVALUATION UNDER ANESTHESIA WITH HEMORRHOIDECTOMY AND PROCTOSCOPY N/A 07/25/2016   Procedure: EXAM UNDER ANESTHESIA, EXCISION PERIANAL MASS.;  Surgeon: Judeth Horn, MD;  Location: [place];  Service: General;  Laterality: N/A;  Prone position  . LAPAROSCOPIC DIVERTED COLOSTOMY N/A 07/29/2016   Procedure: ATTEMPTED LAPAROSCOPIC ASSISTED COLOSTOMY;  Surgeon: Clovis Riley, MD;  Location: [place];  Service: General;  Laterality: N/A;  . LUNG REMOVAL, PARTIAL Right 2013   RML mass/carcinoid, Dr Lurena Nida  . TONSILLECTOMY AND ADENOIDECTOMY  1960s  . VAGINAL HYSTERECTOMY  1959   "partial"    Family History  Problem Relation Age of Onset  . Heart attack Mother     Dec 1987  . CVA Mother   . Hypertension Mother   . Multiple myeloma Mother   . Breast cancer Sister   . Skin cancer Sister   . Prostate cancer Brother   . Throat cancer Brother   . Cervical cancer Daughter   . Leukemia Daughter   . Leukemia Son   . Throat cancer Brother   . Cancer Brother     soft palette  . Heart Problems Brother     Pacemaker  . Colon cancer Neg Hx   . Pancreatic cancer Neg Hx     Social History   Social History  . Marital status: Widowed    Spouse name: N/A  . Number of children: N/A  . Years of education: N/A   Occupational History  . Not on file.   Social History Main Topics  . Smoking status: Former Smoker    Packs/day: 0.50    Years: 15.00     Types: Cigarettes    Quit date: 1976  . Smokeless tobacco: Never Used  . Alcohol use No  . Drug use: No  . Sexual activity: No   Other Topics Concern  . Not on file   Social History Narrative  . No narrative on file   Review of systems: Review of Systems  Constitutional: Negative for fever and chills.  HENT: Negative.   Eyes: Negative for blurred vision.  Respiratory: as per HPI  Cardiovascular: Negative for chest pain and palpitations.  Gastrointestinal: Negative for vomiting, diarrhea, blood per rectum.  Genitourinary: Negative for dysuria, urgency, frequency and hematuria.  Musculoskeletal: Negative for myalgias, back pain and joint pain.  Skin: Negative for itching and rash.  Neurological: Negative for dizziness, tremors, focal weakness, seizures and loss of consciousness.  Endo/Heme/Allergies: Negative for environmental allergies.  Psychiatric/Behavioral: Negative for depression, suicidal ideas and hallucinations.  All other systems reviewed and are negative.  Physical Exam: Blood pressure 106/60, pulse (!) 56, height 5' 4"  (1.626 m), weight 240 lb 9.6 oz (109.1 kg), SpO2 95 %. Gen:      No acute distress HEENT:  EOMI, sclera anicteric Neck:     No masses; no thyromegaly Lungs:    Clear to auscultation bilaterally; normal respiratory effort CV:         Regular rate and rhythm; no murmurs Abd:      + bowel sounds; soft, non-tender; no palpable masses, no distension Ext:    No edema; adequate peripheral perfusion Skin:      Warm and dry; no rash Neuro: alert and oriented x 3 Psych: normal mood and affect  Data Reviewed: CT chest 07/25/16-postsurgical changes in right middle lung, multiple subcentimeter pulmonary nodules. CTA chest 08/27/16-no pulmonary embolus, stable postsurgical changes, multiple subcentimeter pulmonary nodules. Consolidative changes in bilateral lungs, small bilateral pleural effusions. PET scan 09/16/16- resolution of consolidative changes, stable  postsurgical changes, stable subcentimeter pulmonary nodules which are non-PET avid  I have reviewed all the images personally  Assessment:  Pulmonary nodules H/O lung carcinoid s/p resection I had reviewed all images available in our system. The pulmonary nodules have remained stable from December 2017 to February 2018 and are non-PET avid which is reassuring. These are likely benign processes or possibly carcinoid. We'll continue to observe these on the surveillance CTs that she is due to get for her anal cancer.   Recent admission for Pneumonia Noted to have consolidative changes on CT in January due pneumonia. These have resolved after antibiotic therapy.  Dyspnea, congestion. Needs evaluation for COPD given remote smoking history. She will be scheduled for pulmonary function tests and continue on Brovana and duonebs  Plan/Recommendations: - Continue brovana, pulmicort - Follow pulmonary nodules on CT scan - Schedule PFTs  Marshell Garfinkel MD [facility] Pager [phone] 11/04/2016, 2:45 PM  CC: Pomposini, Cherly Anderson, MD

## 2016-11-04 NOTE — Patient Instructions (Signed)
Please sign medical release to get records from previous pcp.  Please have mammogram results faxed to me.  Go to basement for lab results. You will be called with results

## 2016-11-05 DIAGNOSIS — Z933 Colostomy status: Secondary | ICD-10-CM | POA: Diagnosis not present

## 2016-11-05 DIAGNOSIS — E782 Mixed hyperlipidemia: Secondary | ICD-10-CM | POA: Insufficient documentation

## 2016-11-05 DIAGNOSIS — C210 Malignant neoplasm of anus, unspecified: Secondary | ICD-10-CM | POA: Diagnosis not present

## 2016-11-05 LAB — CK TOTAL AND CKMB (NOT AT ARMC)
CK, MB: 0.8 ng/mL (ref 0.0–5.0)
Relative Index: 5.7 — ABNORMAL HIGH (ref 0.0–4.0)
Total CK: 14 U/L (ref 7–177)

## 2016-11-05 MED ORDER — FERROUS SULFATE 325 (65 FE) MG PO TABS: 325.0000 mg | ORAL_TABLET | Freq: Every day | ORAL | 1 refills | Status: DC

## 2016-11-06 ENCOUNTER — Telehealth: Payer: Self-pay | Admitting: Internal Medicine

## 2016-11-06 ENCOUNTER — Telehealth: Payer: Self-pay | Admitting: Pulmonary Disease

## 2016-11-06 DIAGNOSIS — E669 Obesity, unspecified: Secondary | ICD-10-CM | POA: Diagnosis not present

## 2016-11-06 DIAGNOSIS — I5030 Unspecified diastolic (congestive) heart failure: Secondary | ICD-10-CM | POA: Diagnosis not present

## 2016-11-06 DIAGNOSIS — Z433 Encounter for attention to colostomy: Secondary | ICD-10-CM | POA: Diagnosis not present

## 2016-11-06 DIAGNOSIS — J9601 Acute respiratory failure with hypoxia: Secondary | ICD-10-CM

## 2016-11-06 DIAGNOSIS — I110 Hypertensive heart disease with heart failure: Secondary | ICD-10-CM | POA: Diagnosis not present

## 2016-11-06 DIAGNOSIS — F329 Major depressive disorder, single episode, unspecified: Secondary | ICD-10-CM | POA: Diagnosis not present

## 2016-11-06 DIAGNOSIS — I481 Persistent atrial fibrillation: Secondary | ICD-10-CM | POA: Diagnosis not present

## 2016-11-06 DIAGNOSIS — J42 Unspecified chronic bronchitis: Secondary | ICD-10-CM

## 2016-11-06 NOTE — Telephone Encounter (Signed)
Spoke with pt's daughter Collie Siad, states that pt's nebulizer is broken and is [facility].  This has been placed.  Nothing further needed.

## 2016-11-06 NOTE — Telephone Encounter (Signed)
Per patient's daugther. Just finished second course of Macrobid for UTI.  Repeat urine specimen shows UTI still present.  She said Dr. Caren Griffins Heist's office called her and asked her to call Dr. Tanna Furry office to find out what other antibiotics she could take. She has several allergies.  Pt had Tikosyn initiation 10/07/16.  Per telephone note by Fuller Canada, PharmD on 10/25/16, fosfomycin would be acceptable.   I informed the patient's daughter of this and advised if Dr. Louanna Raw office wants to call us, we can provide this information to them directly.

## 2016-11-06 NOTE — Telephone Encounter (Signed)
New Message   Pt c/o medication issue:  1. Name of Medication: dofetilide (TIKOSYN) 500 MCG capsule  2. How are you currently taking this medication (dosage and times per day)? As Prescibed  3. Are you having a reaction (difficulty breathing--STAT)? No  4. What is your medication issue? Pt has had two rounds of microbid & it's not touching the infection. Needs to know what patient can take that won't interfere with current medication.

## 2016-11-08 DIAGNOSIS — F329 Major depressive disorder, single episode, unspecified: Secondary | ICD-10-CM | POA: Diagnosis not present

## 2016-11-08 DIAGNOSIS — I5030 Unspecified diastolic (congestive) heart failure: Secondary | ICD-10-CM | POA: Diagnosis not present

## 2016-11-08 DIAGNOSIS — E669 Obesity, unspecified: Secondary | ICD-10-CM | POA: Diagnosis not present

## 2016-11-08 DIAGNOSIS — I110 Hypertensive heart disease with heart failure: Secondary | ICD-10-CM | POA: Diagnosis not present

## 2016-11-08 DIAGNOSIS — I481 Persistent atrial fibrillation: Secondary | ICD-10-CM | POA: Diagnosis not present

## 2016-11-08 DIAGNOSIS — Z433 Encounter for attention to colostomy: Secondary | ICD-10-CM | POA: Diagnosis not present

## 2016-11-12 ENCOUNTER — Ambulatory Visit (INDEPENDENT_AMBULATORY_CARE_PROVIDER_SITE_OTHER): Payer: Medicare Other | Admitting: Internal Medicine

## 2016-11-12 ENCOUNTER — Ambulatory Visit: Payer: Self-pay | Admitting: Radiation Oncology

## 2016-11-12 ENCOUNTER — Other Ambulatory Visit: Payer: Self-pay | Admitting: Internal Medicine

## 2016-11-12 ENCOUNTER — Encounter: Payer: Self-pay | Admitting: Internal Medicine

## 2016-11-12 VITALS — BP 106/58 | HR 55 | Ht 64.0 in | Wt 245.0 lb

## 2016-11-12 DIAGNOSIS — I480 Paroxysmal atrial fibrillation: Secondary | ICD-10-CM | POA: Diagnosis not present

## 2016-11-12 DIAGNOSIS — H6983 Other specified disorders of Eustachian tube, bilateral: Secondary | ICD-10-CM | POA: Diagnosis not present

## 2016-11-12 DIAGNOSIS — R0981 Nasal congestion: Secondary | ICD-10-CM | POA: Diagnosis not present

## 2016-11-12 MED ORDER — METOPROLOL TARTRATE 25 MG PO TABS: 25.0000 mg | ORAL_TABLET | Freq: Two times a day (BID) | ORAL | 1 refills | Status: DC

## 2016-11-12 MED ORDER — METOPROLOL TARTRATE 25 MG PO TABS: 25.0000 mg | ORAL_TABLET | Freq: Two times a day (BID) | ORAL | 2 refills | Status: DC

## 2016-11-12 MED ORDER — AMIODARONE HCL 200 MG PO TABS: 200.0000 mg | ORAL_TABLET | Freq: Every day | ORAL | 3 refills | Status: DC

## 2016-11-12 MED ORDER — MAGNESIUM OXIDE 400 (241.3 MG) MG PO TABS: 400.0000 mg | ORAL_TABLET | Freq: Every day | ORAL | 1 refills | Status: DC

## 2016-11-12 NOTE — Progress Notes (Signed)
HPI T[facility] is referred today for evaluation of uncontrolled atrial fib with a RVR. She has a h/o lung CA, s/p excession, as well as pulmonary embolism. She was diagnosed with anus CA and has undergone radiation but has not yet healed completely. The patient has very rapid rates in atrial fib. She gets sob and tires easily when she exerts herself. Allergies  Allergen Reactions  . Penicillins Anaphylaxis    Reaction:  Unknown  Has patient had a PCN reaction causing immediate rash, facial/tongue/throat swelling, SOB or lightheadedness with hypotension: Unsure Has patient had a PCN reaction causing severe rash involving mucus membranes or skin necrosis: Unsure Has patient had a PCN reaction that required hospitalization Unsure Has patient had a PCN reaction occurring within the last 10 years: Unsure If all of the above answers are "NO", then may proceed with Cephalosporin use.   . Benadryl [Diphenhydramine Hcl]     Increases heart rate  . Aleve [Naproxen Sodium] Hives  . Antihistamines, Chlorpheniramine-Type Rash    Reaction:  Unknown   . Aspirin Rash  . Codeine Rash  . Hydrochlorothiazide Rash  . Rocephin [Ceftriaxone Sodium In Dextrose] Rash  . Sulfa Antibiotics Rash     Current Outpatient Prescriptions  Medication Sig Dispense Refill  . apixaban (ELIQUIS) 5 MG TABS tablet Take 5 mg by mouth 2 (two) times daily.    Marland Kitchen atorvastatin (LIPITOR) 20 MG tablet Take 1 tablet (20 mg total) by mouth daily. 90 tablet 1  . budesonide (PULMICORT) 0.25 MG/2ML nebulizer solution Take 2 mLs (0.25 mg total) by nebulization 2 (two) times daily. 60 mL 12  . Calcium 600-200 MG-UNIT tablet Take 1 tablet by mouth daily.    Marland Kitchen dofetilide (TIKOSYN) 500 MCG capsule Take 1 capsule (500 mcg total) by mouth 2 (two) times daily. 180 capsule 3  . ENSURE PLUS (ENSURE PLUS) LIQD Take 237 mLs by mouth 3 (three) times daily between meals.    . fenofibrate 160 MG tablet Take 1 tablet (160 mg total) by  mouth daily. 90 tablet 1  . ferrous sulfate 325 (65 FE) MG tablet Take 1 tablet (325 mg total) by mouth daily with breakfast. 90 tablet 1  . fluticasone (FLONASE) 50 MCG/ACT nasal spray Place 1 spray into both nostrils daily.    Marland Kitchen ipratropium-albuterol (DUONEB) 0.5-2.5 (3) MG/3ML SOLN Take 3 mLs by nebulization 2 (two) times daily. 360 mL 1  . magnesium oxide (MAG-OX) 400 (241.3 Mg) MG tablet Take 1 tablet (400 mg total) by mouth 2 (two) times daily. 60 tablet 1  . Metoprolol Tartrate (LOPRESSOR) 25 MG tablet Take 1 tablet (25 mg total) by mouth 2 (two) times daily. 60 tablet 1  . montelukast (SINGULAIR) 10 MG tablet Take 1 tablet (10 mg total) by mouth at bedtime. 90 tablet 1  . PARoxetine (PAXIL) 20 MG tablet Take 20 mg by mouth at bedtime.      No current facility-administered medications for this visit.      Past Medical History:  Diagnosis Date  . 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 Anemia   . Anxiety   . Atrial fibrillation ([place])   . Basal cell carcinoma of skin of nose   . CHF (congestive heart failure) ([place]) 11/2015  . Chronic bronchitis ([place])   . Chronic depression   . Colostomy in place [facility])   . Diverticular disease   . Fibrocystic disease of breast   .  Fracture of shoulder   . GERD (gastroesophageal reflux disease)   . Herpes zoster   . History of blood transfusion    "related to emergent hysterectomy"  . History of hiatal hernia   . Hyperlipidemia   . Hypertension   . Labyrinthitis   . Lung cancer ([place])    RML  . Obesity   . Paroxysmal supraventricular tachycardia ([place])   . Peptic ulcer of stomach    hx  . Pneumonia 1980s X 1; 08/2016   "walking; double"  . Pulmonary embolism ([place]) 05/2014   "left lung"  . Vitamin D deficiency     ROS:   All systems reviewed and negative except as noted in the HPI.   Past Surgical History:  Procedure Laterality Date  . APPENDECTOMY  1959  . BASAL CELL CARCINOMA  EXCISION     nose  . BREAST CYST ASPIRATION Right 2016?  Marland Kitchen CATARACT EXTRACTION W/ INTRAOCULAR LENS  IMPLANT, BILATERAL Bilateral 1990s  . CHOLECYSTECTOMY OPEN  1980s  . COLON SURGERY    . COLOSTOMY N/A 07/29/2016   Procedure: COLOSTOMY;  Surgeon: Clovis Riley, MD;  Location: [place];  Service: General;  Laterality: N/A;  . DILATION AND CURETTAGE OF UTERUS    . EVALUATION UNDER ANESTHESIA WITH HEMORRHOIDECTOMY AND PROCTOSCOPY N/A 07/25/2016   Procedure: EXAM UNDER ANESTHESIA, EXCISION PERIANAL MASS.;  Surgeon: Judeth Horn, MD;  Location: [place];  Service: General;  Laterality: N/A;  Prone position  . LAPAROSCOPIC DIVERTED COLOSTOMY N/A 07/29/2016   Procedure: ATTEMPTED LAPAROSCOPIC ASSISTED COLOSTOMY;  Surgeon: Clovis Riley, MD;  Location: [place];  Service: General;  Laterality: N/A;  . LUNG REMOVAL, PARTIAL Right 2013   RML mass/carcinoid, Dr Lurena Nida  . TONSILLECTOMY AND ADENOIDECTOMY  1960s  . VAGINAL HYSTERECTOMY  1959   "partial"     Family History  Problem Relation Age of Onset  . Heart attack Mother     Dec 1987  . CVA Mother   . Hypertension Mother   . Multiple myeloma Mother   . Breast cancer Sister   . Skin cancer Sister   . Prostate cancer Brother   . Throat cancer Brother   . Cervical cancer Daughter   . Leukemia Daughter   . Leukemia Son   . Throat cancer Brother   . Cancer Brother     soft palette  . Heart Problems Brother     Pacemaker  . Colon cancer Neg Hx   . Pancreatic cancer Neg Hx      Social History   Social History  . Marital status: Widowed    Spouse name: N/A  . Number of children: N/A  . Years of education: N/A   Occupational History  . Not on file.   Social History Main Topics  . Smoking status: Former Smoker    Packs/day: 0.50    Years: 15.00    Types: Cigarettes    Quit date: 1976  . Smokeless tobacco: Never Used  . Alcohol use No  . Drug use: No  . Sexual activity: No   Other Topics Concern  . Not on file   Social  History Narrative  . No narrative on file     BP (!) 106/58   Pulse (!) 55   Ht 5' 4"  (1.626 m)   Wt 245 lb (111.1 kg)   SpO2 96%   BMI 42.05 kg/m   Physical Exam:  Well appearing NAD HEENT: Unremarkable Neck:  No JVD, no thyromegally Lymphatics:  No adenopathy Back:  No CVA tenderness Lungs:  Clear HEART:  Regular rate rhythm, no murmurs, no rubs, no clicks Abd:  soft, positive bowel sounds, no organomegally, no rebound, no guarding Ext:  2 plus pulses, 2+ peripheral edema, no cyanosis, no clubbing Skin:  No rashes no nodules Neuro:  CN II through XII intact, motor grossly intact  EKG - atrial fib with a RVR, QTC around 415  Assess/Plan: 1. Uncontrolled atrial fib - she is maintaining NSR on dofetilide. Will recheck in several months 2. Obesity - she is encouraged to lose weight. 3. HTN - her blood pressure is well controlled. Will follow.  4. Chronic diastolic heart failure - her symptoms are class 2. She will continue her current meds. She has peripheral edema but this is much improved since she went back to NSR.  Mikle Bosworth.D.

## 2016-11-12 NOTE — Telephone Encounter (Signed)
Medication Detail    Disp Refills Start End   metoprolol tartrate (LOPRESSOR) 25 MG tablet 180 tablet 2 11/12/2016    Sig - Route: Take 1 tablet (25 mg total) by mouth 2 (two) times daily. - Oral   Notes to Pharmacy: disregard earlier order of one month supply pt wants 3 months   E-Prescribing Status: Receipt confirmed by pharmacy (11/12/2016 11:30 AM EDT)   [id number] - [place], [address] AT [place]

## 2016-11-12 NOTE — Patient Instructions (Addendum)
Medication Instructions:  Your physician has recommended you make the following change in your medication:  1) DECREASE Magnesium to 400 mg (one tablet) tablet ONCE daily   Labwork: none  Testing/Procedures: none  Follow-Up: Your physician wants you to follow-up in: 6 months with Dr. Lovena Le. You will receive a reminder letter in the mail two months in advance. If you don't receive a letter, please call our office to schedule the follow-up appointment.   Any Other Special Instructions Will Be Listed Below (If Applicable).     If you need a refill on your cardiac medications before your next appointment, please call your pharmacy.

## 2016-11-13 ENCOUNTER — Other Ambulatory Visit: Payer: Self-pay

## 2016-11-13 ENCOUNTER — Telehealth: Payer: Self-pay

## 2016-11-13 DIAGNOSIS — I5030 Unspecified diastolic (congestive) heart failure: Secondary | ICD-10-CM | POA: Diagnosis not present

## 2016-11-13 DIAGNOSIS — E669 Obesity, unspecified: Secondary | ICD-10-CM | POA: Diagnosis not present

## 2016-11-13 DIAGNOSIS — Z433 Encounter for attention to colostomy: Secondary | ICD-10-CM | POA: Diagnosis not present

## 2016-11-13 DIAGNOSIS — I481 Persistent atrial fibrillation: Secondary | ICD-10-CM | POA: Diagnosis not present

## 2016-11-13 DIAGNOSIS — I110 Hypertensive heart disease with heart failure: Secondary | ICD-10-CM | POA: Diagnosis not present

## 2016-11-13 DIAGNOSIS — F329 Major depressive disorder, single episode, unspecified: Secondary | ICD-10-CM | POA: Diagnosis not present

## 2016-11-13 MED ORDER — IPRATROPIUM-ALBUTEROL 0.5-2.5 (3) MG/3ML IN SOLN: 3.0000 mL | Freq: Two times a day (BID) | RESPIRATORY_TRACT | 0 refills | Status: DC

## 2016-11-13 NOTE — Telephone Encounter (Signed)
[facility] pharmacy requesting 90 day supply of douneb, Rx sent.

## 2016-11-13 NOTE — Telephone Encounter (Signed)
PA for Pulmicort nebs received. Clinical information was filled out and routed to [employer] for PM signature

## 2016-11-14 ENCOUNTER — Encounter: Payer: Medicare Other | Admitting: Internal Medicine

## 2016-11-14 ENCOUNTER — Telehealth: Payer: Self-pay | Admitting: Pulmonary Disease

## 2016-11-14 DIAGNOSIS — R0602 Shortness of breath: Secondary | ICD-10-CM

## 2016-11-14 MED ORDER — IPRATROPIUM-ALBUTEROL 0.5-2.5 (3) MG/3ML IN SOLN: 3.0000 mL | Freq: Two times a day (BID) | RESPIRATORY_TRACT | 0 refills | Status: DC

## 2016-11-14 NOTE — Telephone Encounter (Signed)
A PA was received from covermymeds for duoneb. Pt was called, Collie Siad (daughter) answered and was informed about PA. She was asked who pt's DME company is to send neb solutions to be filled so it will be in Medicare guidelines. She stated pt uses [facility] home health in [place]. An order was placed for nebs. Will route to [facility] to follow.

## 2016-11-14 NOTE — Telephone Encounter (Signed)
Rx has been singed and given to [facility] to send to DME per pt request. Nothing further needed

## 2016-11-15 DIAGNOSIS — I110 Hypertensive heart disease with heart failure: Secondary | ICD-10-CM | POA: Diagnosis not present

## 2016-11-15 DIAGNOSIS — Z433 Encounter for attention to colostomy: Secondary | ICD-10-CM | POA: Diagnosis not present

## 2016-11-15 DIAGNOSIS — I481 Persistent atrial fibrillation: Secondary | ICD-10-CM | POA: Diagnosis not present

## 2016-11-15 DIAGNOSIS — E669 Obesity, unspecified: Secondary | ICD-10-CM | POA: Diagnosis not present

## 2016-11-15 DIAGNOSIS — I5030 Unspecified diastolic (congestive) heart failure: Secondary | ICD-10-CM | POA: Diagnosis not present

## 2016-11-15 DIAGNOSIS — F329 Major depressive disorder, single episode, unspecified: Secondary | ICD-10-CM | POA: Diagnosis not present

## 2016-11-15 NOTE — Telephone Encounter (Signed)
PA has been signed by PM and faxed to provided number. Will leave message open to f/u on.

## 2016-11-18 ENCOUNTER — Ambulatory Visit: Payer: Medicare Other | Admitting: Nurse Practitioner

## 2016-11-20 DIAGNOSIS — E669 Obesity, unspecified: Secondary | ICD-10-CM | POA: Diagnosis not present

## 2016-11-20 DIAGNOSIS — I110 Hypertensive heart disease with heart failure: Secondary | ICD-10-CM | POA: Diagnosis not present

## 2016-11-20 DIAGNOSIS — Z433 Encounter for attention to colostomy: Secondary | ICD-10-CM | POA: Diagnosis not present

## 2016-11-20 DIAGNOSIS — F329 Major depressive disorder, single episode, unspecified: Secondary | ICD-10-CM | POA: Diagnosis not present

## 2016-11-20 DIAGNOSIS — I481 Persistent atrial fibrillation: Secondary | ICD-10-CM | POA: Diagnosis not present

## 2016-11-20 DIAGNOSIS — I5030 Unspecified diastolic (congestive) heart failure: Secondary | ICD-10-CM | POA: Diagnosis not present

## 2016-11-22 DIAGNOSIS — I5030 Unspecified diastolic (congestive) heart failure: Secondary | ICD-10-CM | POA: Diagnosis not present

## 2016-11-22 DIAGNOSIS — F329 Major depressive disorder, single episode, unspecified: Secondary | ICD-10-CM | POA: Diagnosis not present

## 2016-11-22 DIAGNOSIS — I110 Hypertensive heart disease with heart failure: Secondary | ICD-10-CM | POA: Diagnosis not present

## 2016-11-22 DIAGNOSIS — I481 Persistent atrial fibrillation: Secondary | ICD-10-CM | POA: Diagnosis not present

## 2016-11-22 DIAGNOSIS — E669 Obesity, unspecified: Secondary | ICD-10-CM | POA: Diagnosis not present

## 2016-11-22 DIAGNOSIS — Z433 Encounter for attention to colostomy: Secondary | ICD-10-CM | POA: Diagnosis not present

## 2016-11-26 DIAGNOSIS — F329 Major depressive disorder, single episode, unspecified: Secondary | ICD-10-CM | POA: Diagnosis not present

## 2016-11-26 DIAGNOSIS — N390 Urinary tract infection, site not specified: Secondary | ICD-10-CM | POA: Diagnosis not present

## 2016-11-26 DIAGNOSIS — E669 Obesity, unspecified: Secondary | ICD-10-CM | POA: Diagnosis not present

## 2016-11-26 DIAGNOSIS — I110 Hypertensive heart disease with heart failure: Secondary | ICD-10-CM | POA: Diagnosis not present

## 2016-11-26 DIAGNOSIS — I5030 Unspecified diastolic (congestive) heart failure: Secondary | ICD-10-CM | POA: Diagnosis not present

## 2016-11-26 DIAGNOSIS — I481 Persistent atrial fibrillation: Secondary | ICD-10-CM | POA: Diagnosis not present

## 2016-11-26 DIAGNOSIS — Z433 Encounter for attention to colostomy: Secondary | ICD-10-CM | POA: Diagnosis not present

## 2016-11-26 NOTE — Telephone Encounter (Signed)
Received denial for brovana and pulmicort, due to being filed under medicare part D.  Drugs used in nebulizer at home are covered under part B.  Due to our phones currently being down, I will call insurance company on 11/27/16 to start PA under part B.

## 2016-11-28 DIAGNOSIS — E669 Obesity, unspecified: Secondary | ICD-10-CM | POA: Diagnosis not present

## 2016-11-28 DIAGNOSIS — I110 Hypertensive heart disease with heart failure: Secondary | ICD-10-CM | POA: Diagnosis not present

## 2016-11-28 DIAGNOSIS — F329 Major depressive disorder, single episode, unspecified: Secondary | ICD-10-CM | POA: Diagnosis not present

## 2016-11-28 DIAGNOSIS — Z433 Encounter for attention to colostomy: Secondary | ICD-10-CM | POA: Diagnosis not present

## 2016-11-28 DIAGNOSIS — I5030 Unspecified diastolic (congestive) heart failure: Secondary | ICD-10-CM | POA: Diagnosis not present

## 2016-11-28 DIAGNOSIS — I481 Persistent atrial fibrillation: Secondary | ICD-10-CM | POA: Diagnosis not present

## 2016-12-02 ENCOUNTER — Other Ambulatory Visit: Payer: Self-pay | Admitting: *Deleted

## 2016-12-02 MED ORDER — APIXABAN 5 MG PO TABS: 5.0000 mg | ORAL_TABLET | Freq: Two times a day (BID) | ORAL | 1 refills | Status: DC

## 2016-12-02 NOTE — Telephone Encounter (Signed)
Called [facility] to find out how to run medication through part B. When speaking the pharmacist he ran the pt's insurance for Pulmicort and they stated that it is covered with $0 co-pay. He also stated he does not have record of Brovana. Upon looking into pt's current medication list I do not see this medication either. Daughter is made aware that this is covered by insurance. Pharmacist verified that pt would be able to pick up rx that was called in back in March. Nothing further is needed

## 2016-12-03 DIAGNOSIS — I110 Hypertensive heart disease with heart failure: Secondary | ICD-10-CM | POA: Diagnosis not present

## 2016-12-03 DIAGNOSIS — F329 Major depressive disorder, single episode, unspecified: Secondary | ICD-10-CM | POA: Diagnosis not present

## 2016-12-03 DIAGNOSIS — I5030 Unspecified diastolic (congestive) heart failure: Secondary | ICD-10-CM | POA: Diagnosis not present

## 2016-12-03 DIAGNOSIS — E669 Obesity, unspecified: Secondary | ICD-10-CM | POA: Diagnosis not present

## 2016-12-03 DIAGNOSIS — Z433 Encounter for attention to colostomy: Secondary | ICD-10-CM | POA: Diagnosis not present

## 2016-12-03 DIAGNOSIS — I481 Persistent atrial fibrillation: Secondary | ICD-10-CM | POA: Diagnosis not present

## 2016-12-05 DIAGNOSIS — E669 Obesity, unspecified: Secondary | ICD-10-CM | POA: Diagnosis not present

## 2016-12-05 DIAGNOSIS — I5030 Unspecified diastolic (congestive) heart failure: Secondary | ICD-10-CM | POA: Diagnosis not present

## 2016-12-05 DIAGNOSIS — Z433 Encounter for attention to colostomy: Secondary | ICD-10-CM | POA: Diagnosis not present

## 2016-12-05 DIAGNOSIS — I110 Hypertensive heart disease with heart failure: Secondary | ICD-10-CM | POA: Diagnosis not present

## 2016-12-05 DIAGNOSIS — I481 Persistent atrial fibrillation: Secondary | ICD-10-CM | POA: Diagnosis not present

## 2016-12-05 DIAGNOSIS — F329 Major depressive disorder, single episode, unspecified: Secondary | ICD-10-CM | POA: Diagnosis not present

## 2016-12-09 ENCOUNTER — Telehealth: Payer: Self-pay | Admitting: Internal Medicine

## 2016-12-09 DIAGNOSIS — F329 Major depressive disorder, single episode, unspecified: Secondary | ICD-10-CM | POA: Diagnosis not present

## 2016-12-09 DIAGNOSIS — I5030 Unspecified diastolic (congestive) heart failure: Secondary | ICD-10-CM | POA: Diagnosis not present

## 2016-12-09 DIAGNOSIS — E669 Obesity, unspecified: Secondary | ICD-10-CM | POA: Diagnosis not present

## 2016-12-09 DIAGNOSIS — I481 Persistent atrial fibrillation: Secondary | ICD-10-CM | POA: Diagnosis not present

## 2016-12-09 DIAGNOSIS — Z433 Encounter for attention to colostomy: Secondary | ICD-10-CM | POA: Diagnosis not present

## 2016-12-09 DIAGNOSIS — I110 Hypertensive heart disease with heart failure: Secondary | ICD-10-CM | POA: Diagnosis not present

## 2016-12-09 NOTE — Telephone Encounter (Signed)
New message    Pt is having problems breathing when she stands up or laying flat on back, she gets short winded when she walks   Pt c/o swelling: STAT is pt has developed SOB within 24 hours  1. How long have you been experiencing swelling? A couple minutes   2. Where is the swelling located? Face   3.  Are you currently taking a "fluid pill"? no  4.  Are you currently SOB? yes  5.  Have you traveled recently? no

## 2016-12-09 NOTE — Telephone Encounter (Signed)
Reviewed with Dr. Burt Knack who recommends pt go to ED for evaluation. I spoke with pt's daughter and gave her this information.

## 2016-12-09 NOTE — Telephone Encounter (Signed)
I spoke with pt's daughter. She reports pt developed swelling in abdomen and face last week.  No swelling in feet/ankles. Doesn't weigh daily. Heart rate 78.   Increased shortness of breath for last few days.  Daughter notes large change in last 3 days.  Yesterday and today pt is extremely short of breath when lying flat and with any exertion.  OK when sitting up.  Daughter reports pt has "loud breathing" which has been present since lung resection in 2013.  Is wheezing when walking.  Has inhalers but they are old.   Daughter also reports pt has panic disorder.  Will review with provider in office.

## 2016-12-10 ENCOUNTER — Emergency Department (HOSPITAL_COMMUNITY): Payer: Medicare Other

## 2016-12-10 ENCOUNTER — Encounter (HOSPITAL_COMMUNITY): Payer: Self-pay | Admitting: Emergency Medicine

## 2016-12-10 ENCOUNTER — Emergency Department (HOSPITAL_BASED_OUTPATIENT_CLINIC_OR_DEPARTMENT_OTHER): Admit: 2016-12-10 | Discharge: 2016-12-10 | Disposition: A | Discharge status: Home / Self Care | Payer: Medicare Other

## 2016-12-10 ENCOUNTER — Inpatient Hospital Stay (HOSPITAL_COMMUNITY)
Admission: EM | Admit: 2016-12-10 | Discharge: 2016-12-15 | DRG: 378 | Disposition: A | Discharge status: Skilled Nursing Facility | Payer: Medicare Other | Attending: Internal Medicine | Admitting: Internal Medicine

## 2016-12-10 DIAGNOSIS — Z86711 Personal history of pulmonary embolism: Secondary | ICD-10-CM

## 2016-12-10 DIAGNOSIS — E785 Hyperlipidemia, unspecified: Secondary | ICD-10-CM | POA: Diagnosis not present

## 2016-12-10 DIAGNOSIS — R195 Other fecal abnormalities: Secondary | ICD-10-CM | POA: Diagnosis not present

## 2016-12-10 DIAGNOSIS — R2689 Other abnormalities of gait and mobility: Secondary | ICD-10-CM | POA: Diagnosis not present

## 2016-12-10 DIAGNOSIS — K210 Gastro-esophageal reflux disease with esophagitis: Secondary | ICD-10-CM | POA: Diagnosis present

## 2016-12-10 DIAGNOSIS — Z902 Acquired absence of lung [part of]: Secondary | ICD-10-CM

## 2016-12-10 DIAGNOSIS — K31811 Angiodysplasia of stomach and duodenum with bleeding: Secondary | ICD-10-CM | POA: Diagnosis not present

## 2016-12-10 DIAGNOSIS — D500 Iron deficiency anemia secondary to blood loss (chronic): Secondary | ICD-10-CM | POA: Diagnosis not present

## 2016-12-10 DIAGNOSIS — Z885 Allergy status to narcotic agent status: Secondary | ICD-10-CM

## 2016-12-10 DIAGNOSIS — Z7901 Long term (current) use of anticoagulants: Secondary | ICD-10-CM

## 2016-12-10 DIAGNOSIS — I10 Essential (primary) hypertension: Secondary | ICD-10-CM | POA: Diagnosis not present

## 2016-12-10 DIAGNOSIS — Z933 Colostomy status: Secondary | ICD-10-CM

## 2016-12-10 DIAGNOSIS — I360 Nonrheumatic tricuspid (valve) stenosis: Secondary | ICD-10-CM | POA: Diagnosis not present

## 2016-12-10 DIAGNOSIS — R0902 Hypoxemia: Secondary | ICD-10-CM | POA: Diagnosis not present

## 2016-12-10 DIAGNOSIS — I481 Persistent atrial fibrillation: Secondary | ICD-10-CM | POA: Diagnosis not present

## 2016-12-10 DIAGNOSIS — Z882 Allergy status to sulfonamides status: Secondary | ICD-10-CM

## 2016-12-10 DIAGNOSIS — D6489 Other specified anemias: Secondary | ICD-10-CM | POA: Diagnosis present

## 2016-12-10 DIAGNOSIS — K2970 Gastritis, unspecified, without bleeding: Secondary | ICD-10-CM | POA: Diagnosis not present

## 2016-12-10 DIAGNOSIS — F329 Major depressive disorder, single episode, unspecified: Secondary | ICD-10-CM | POA: Diagnosis not present

## 2016-12-10 DIAGNOSIS — E559 Vitamin D deficiency, unspecified: Secondary | ICD-10-CM | POA: Diagnosis not present

## 2016-12-10 DIAGNOSIS — K921 Melena: Secondary | ICD-10-CM | POA: Diagnosis not present

## 2016-12-10 DIAGNOSIS — Z8711 Personal history of peptic ulcer disease: Secondary | ICD-10-CM

## 2016-12-10 DIAGNOSIS — D696 Thrombocytopenia, unspecified: Secondary | ICD-10-CM | POA: Diagnosis not present

## 2016-12-10 DIAGNOSIS — J42 Unspecified chronic bronchitis: Secondary | ICD-10-CM | POA: Diagnosis not present

## 2016-12-10 DIAGNOSIS — I110 Hypertensive heart disease with heart failure: Secondary | ICD-10-CM | POA: Diagnosis present

## 2016-12-10 DIAGNOSIS — Z961 Presence of intraocular lens: Secondary | ICD-10-CM | POA: Diagnosis present

## 2016-12-10 DIAGNOSIS — Z85118 Personal history of other malignant neoplasm of bronchus and lung: Secondary | ICD-10-CM

## 2016-12-10 DIAGNOSIS — I4819 Other persistent atrial fibrillation: Secondary | ICD-10-CM | POA: Diagnosis present

## 2016-12-10 DIAGNOSIS — M7989 Other specified soft tissue disorders: Secondary | ICD-10-CM | POA: Diagnosis not present

## 2016-12-10 DIAGNOSIS — I4891 Unspecified atrial fibrillation: Secondary | ICD-10-CM

## 2016-12-10 DIAGNOSIS — Z85048 Personal history of other malignant neoplasm of rectum, rectosigmoid junction, and anus: Secondary | ICD-10-CM

## 2016-12-10 DIAGNOSIS — I5032 Chronic diastolic (congestive) heart failure: Secondary | ICD-10-CM | POA: Diagnosis present

## 2016-12-10 DIAGNOSIS — J449 Chronic obstructive pulmonary disease, unspecified: Secondary | ICD-10-CM | POA: Diagnosis present

## 2016-12-10 DIAGNOSIS — Z880 Allergy status to penicillin: Secondary | ICD-10-CM

## 2016-12-10 DIAGNOSIS — C342 Malignant neoplasm of middle lobe, bronchus or lung: Secondary | ICD-10-CM | POA: Diagnosis not present

## 2016-12-10 DIAGNOSIS — R0602 Shortness of breath: Secondary | ICD-10-CM | POA: Diagnosis not present

## 2016-12-10 DIAGNOSIS — Z6841 Body Mass Index (BMI) 40.0 and over, adult: Secondary | ICD-10-CM

## 2016-12-10 DIAGNOSIS — D62 Acute posthemorrhagic anemia: Secondary | ICD-10-CM | POA: Diagnosis not present

## 2016-12-10 DIAGNOSIS — J309 Allergic rhinitis, unspecified: Secondary | ICD-10-CM | POA: Diagnosis not present

## 2016-12-10 DIAGNOSIS — Z886 Allergy status to analgesic agent status: Secondary | ICD-10-CM

## 2016-12-10 DIAGNOSIS — Z923 Personal history of irradiation: Secondary | ICD-10-CM

## 2016-12-10 DIAGNOSIS — M6281 Muscle weakness (generalized): Secondary | ICD-10-CM | POA: Diagnosis not present

## 2016-12-10 DIAGNOSIS — Z9841 Cataract extraction status, right eye: Secondary | ICD-10-CM

## 2016-12-10 DIAGNOSIS — Z79899 Other long term (current) drug therapy: Secondary | ICD-10-CM

## 2016-12-10 DIAGNOSIS — Z85828 Personal history of other malignant neoplasm of skin: Secondary | ICD-10-CM | POA: Diagnosis not present

## 2016-12-10 DIAGNOSIS — Z86718 Personal history of other venous thrombosis and embolism: Secondary | ICD-10-CM | POA: Diagnosis not present

## 2016-12-10 DIAGNOSIS — Z808 Family history of malignant neoplasm of other organs or systems: Secondary | ICD-10-CM

## 2016-12-10 DIAGNOSIS — K31819 Angiodysplasia of stomach and duodenum without bleeding: Secondary | ICD-10-CM | POA: Diagnosis not present

## 2016-12-10 DIAGNOSIS — Z888 Allergy status to other drugs, medicaments and biological substances status: Secondary | ICD-10-CM

## 2016-12-10 DIAGNOSIS — K922 Gastrointestinal hemorrhage, unspecified: Secondary | ICD-10-CM | POA: Diagnosis not present

## 2016-12-10 DIAGNOSIS — K219 Gastro-esophageal reflux disease without esophagitis: Secondary | ICD-10-CM | POA: Diagnosis not present

## 2016-12-10 DIAGNOSIS — Z87891 Personal history of nicotine dependence: Secondary | ICD-10-CM | POA: Diagnosis not present

## 2016-12-10 DIAGNOSIS — D649 Anemia, unspecified: Secondary | ICD-10-CM | POA: Diagnosis present

## 2016-12-10 DIAGNOSIS — Z9842 Cataract extraction status, left eye: Secondary | ICD-10-CM

## 2016-12-10 DIAGNOSIS — I509 Heart failure, unspecified: Secondary | ICD-10-CM | POA: Diagnosis not present

## 2016-12-10 DIAGNOSIS — Z741 Need for assistance with personal care: Secondary | ICD-10-CM | POA: Diagnosis not present

## 2016-12-10 DIAGNOSIS — K209 Esophagitis, unspecified: Secondary | ICD-10-CM | POA: Diagnosis not present

## 2016-12-10 HISTORY — DX: Gastrointestinal hemorrhage, unspecified: K92.2

## 2016-12-10 LAB — CBC
HCT: 27.1 % — ABNORMAL LOW (ref 36.0–46.0)
HEMOGLOBIN: 8 g/dL — AB (ref 12.0–15.0)
MCH: 27 pg (ref 26.0–34.0)
MCHC: 29.5 g/dL — AB (ref 30.0–36.0)
MCV: 91.6 fL (ref 78.0–100.0)
Platelets: 147 10*3/uL — ABNORMAL LOW (ref 150–400)
RBC: 2.96 MIL/uL — AB (ref 3.87–5.11)
RDW: 18.6 % — ABNORMAL HIGH (ref 11.5–15.5)
WBC: 4.6 10*3/uL (ref 4.0–10.5)

## 2016-12-10 LAB — VITAMIN B12: VITAMIN B 12: 1174 pg/mL — AB (ref 180–914)

## 2016-12-10 LAB — POC OCCULT BLOOD, ED: Fecal Occult Bld: POSITIVE — AB

## 2016-12-10 LAB — TROPONIN I: TROPONIN I: 0.07 ng/mL — AB (ref <0.03)

## 2016-12-10 LAB — BRAIN NATRIURETIC PEPTIDE: B Natriuretic Peptide: 328.7 pg/mL — ABNORMAL HIGH (ref 0.0–100.0)

## 2016-12-10 LAB — BASIC METABOLIC PANEL
ANION GAP: 5 (ref 5–15)
BUN: 15 mg/dL (ref 6–20)
CALCIUM: 8.6 mg/dL — AB (ref 8.9–10.3)
CO2: 30 mmol/L (ref 22–32)
Chloride: 105 mmol/L (ref 101–111)
Creatinine, Ser: 0.89 mg/dL (ref 0.44–1.00)
GFR calc non Af Amer: 58 mL/min — ABNORMAL LOW (ref >60)
Glucose, Bld: 96 mg/dL (ref 65–99)
Potassium: 4 mmol/L (ref 3.5–5.1)
Sodium: 140 mmol/L (ref 135–145)

## 2016-12-10 LAB — MAGNESIUM: MAGNESIUM: 2.1 mg/dL (ref 1.7–2.4)

## 2016-12-10 LAB — RETICULOCYTES
RBC.: 2.74 MIL/uL — AB (ref 3.87–5.11)
RETIC CT PCT: 4.1 % — AB (ref 0.4–3.1)
Retic Count, Absolute: 112.3 10*3/uL (ref 19.0–186.0)

## 2016-12-10 LAB — FOLATE: FOLATE: 23 ng/mL (ref >5.9)

## 2016-12-10 LAB — TSH: TSH: 1.769 u[IU]/mL (ref 0.350–4.500)

## 2016-12-10 LAB — I-STAT TROPONIN, ED: TROPONIN I, POC: 0.06 ng/mL (ref 0.00–0.08)

## 2016-12-10 LAB — FERRITIN: FERRITIN: 32 ng/mL (ref 11–307)

## 2016-12-10 MED ORDER — PANTOPRAZOLE SODIUM 40 MG PO TBEC
40.0000 mg | DELAYED_RELEASE_TABLET | Freq: Two times a day (BID) | ORAL | Status: DC
  Administered 2016-12-10 – 2016-12-12 (×4): 40 mg via ORAL
  Filled 2016-12-10 (×5): qty 1

## 2016-12-10 MED ORDER — FLUTICASONE PROPIONATE 50 MCG/ACT NA SUSP
1.0000 | Freq: Every day | NASAL | Status: DC
  Administered 2016-12-12 – 2016-12-15 (×4): 1 via NASAL
  Filled 2016-12-10: qty 16

## 2016-12-10 MED ORDER — ATORVASTATIN CALCIUM 20 MG PO TABS
20.0000 mg | ORAL_TABLET | Freq: Every day | ORAL | Status: DC
  Administered 2016-12-10 – 2016-12-14 (×5): 20 mg via ORAL
  Filled 2016-12-10 (×5): qty 1

## 2016-12-10 MED ORDER — FENOFIBRATE 160 MG PO TABS
160.0000 mg | ORAL_TABLET | Freq: Every day | ORAL | Status: DC
  Administered 2016-12-12 – 2016-12-15 (×4): 160 mg via ORAL
  Filled 2016-12-10 (×5): qty 1

## 2016-12-10 MED ORDER — BUDESONIDE 0.25 MG/2ML IN SUSP
0.2500 mg | Freq: Two times a day (BID) | RESPIRATORY_TRACT | Status: DC
  Administered 2016-12-10 – 2016-12-15 (×9): 0.25 mg via RESPIRATORY_TRACT
  Filled 2016-12-10 (×9): qty 2

## 2016-12-10 MED ORDER — SODIUM CHLORIDE 0.9 % IV SOLN
250.0000 mL | INTRAVENOUS | Status: DC | PRN
  Administered 2016-12-11: 10:00:00 via INTRAVENOUS

## 2016-12-10 MED ORDER — IPRATROPIUM-ALBUTEROL 0.5-2.5 (3) MG/3ML IN SOLN
3.0000 mL | Freq: Two times a day (BID) | RESPIRATORY_TRACT | Status: DC
  Administered 2016-12-10 – 2016-12-15 (×9): 3 mL via RESPIRATORY_TRACT
  Filled 2016-12-10 (×9): qty 3

## 2016-12-10 MED ORDER — MAGNESIUM OXIDE 400 (241.3 MG) MG PO TABS
400.0000 mg | ORAL_TABLET | Freq: Every day | ORAL | Status: DC
  Administered 2016-12-12 – 2016-12-15 (×4): 400 mg via ORAL
  Filled 2016-12-10 (×5): qty 1

## 2016-12-10 MED ORDER — SENNOSIDES-DOCUSATE SODIUM 8.6-50 MG PO TABS: 1.0000 | ORAL_TABLET | Freq: Every evening | ORAL | Status: DC | PRN

## 2016-12-10 MED ORDER — ENSURE ENLIVE PO LIQD
237.0000 mL | Freq: Three times a day (TID) | ORAL | Status: DC
  Administered 2016-12-10 – 2016-12-15 (×13): 237 mL via ORAL

## 2016-12-10 MED ORDER — DOFETILIDE 500 MCG PO CAPS
500.0000 ug | ORAL_CAPSULE | Freq: Two times a day (BID) | ORAL | Status: DC
  Administered 2016-12-10 – 2016-12-15 (×8): 500 ug via ORAL
  Filled 2016-12-10 (×14): qty 1

## 2016-12-10 MED ORDER — PAROXETINE HCL 20 MG PO TABS
20.0000 mg | ORAL_TABLET | Freq: Every day | ORAL | Status: DC
  Administered 2016-12-10 – 2016-12-14 (×5): 20 mg via ORAL
  Filled 2016-12-10 (×5): qty 1

## 2016-12-10 MED ORDER — ONDANSETRON HCL 4 MG/2ML IJ SOLN: 4.0000 mg | Freq: Four times a day (QID) | INTRAMUSCULAR | Status: DC | PRN

## 2016-12-10 MED ORDER — MONTELUKAST SODIUM 10 MG PO TABS
10.0000 mg | ORAL_TABLET | Freq: Every day | ORAL | Status: DC
  Administered 2016-12-10 – 2016-12-14 (×5): 10 mg via ORAL
  Filled 2016-12-10 (×5): qty 1

## 2016-12-10 MED ORDER — SODIUM CHLORIDE 0.9% FLUSH
3.0000 mL | Freq: Two times a day (BID) | INTRAVENOUS | Status: DC
  Administered 2016-12-10 – 2016-12-14 (×7): 3 mL via INTRAVENOUS

## 2016-12-10 MED ORDER — TRAZODONE HCL 50 MG PO TABS: 50.0000 mg | ORAL_TABLET | Freq: Every evening | ORAL | Status: DC | PRN

## 2016-12-10 MED ORDER — METOPROLOL TARTRATE 25 MG PO TABS
25.0000 mg | ORAL_TABLET | Freq: Two times a day (BID) | ORAL | Status: DC
  Administered 2016-12-10 – 2016-12-15 (×6): 25 mg via ORAL
  Filled 2016-12-10 (×10): qty 1

## 2016-12-10 MED ORDER — ONDANSETRON HCL 4 MG PO TABS: 4.0000 mg | ORAL_TABLET | Freq: Four times a day (QID) | ORAL | Status: DC | PRN

## 2016-12-10 MED ORDER — CALCIUM CARBONATE-VITAMIN D 500-200 MG-UNIT PO TABS
1.0000 | ORAL_TABLET | Freq: Every day | ORAL | Status: DC
  Administered 2016-12-12 – 2016-12-15 (×4): 1 via ORAL
  Filled 2016-12-10 (×4): qty 1

## 2016-12-10 MED ORDER — ACETAMINOPHEN 325 MG PO TABS: 650.0000 mg | ORAL_TABLET | Freq: Four times a day (QID) | ORAL | Status: DC | PRN

## 2016-12-10 MED ORDER — FERROUS SULFATE 325 (65 FE) MG PO TABS: 325.0000 mg | ORAL_TABLET | Freq: Every day | ORAL | Status: DC

## 2016-12-10 MED ORDER — SODIUM CHLORIDE 0.9% FLUSH
3.0000 mL | INTRAVENOUS | Status: DC | PRN
  Administered 2016-12-14: 3 mL via INTRAVENOUS
  Filled 2016-12-10: qty 3

## 2016-12-10 NOTE — ED Notes (Signed)
ED Provider at bedside. 

## 2016-12-10 NOTE — ED Notes (Signed)
Hospitalist at bedside 

## 2016-12-10 NOTE — Consult Note (Signed)
[facility] Consult: 4:13 PM 12/10/2016  LOS: 0 days    Referring Provider: Dr Wynetta [facility] of [facility]  Primary Care Physician:  Wilfred Lacy, NP Primary Gastroenterologist:  Dr. Carlean Purl   Reason for Consultation: anemia, FOBT +/     HPI: Tricia Hernandez is a 83 y.o. female.  PMH a fib.  DVT/PE 2015.  Chronic Eliquis.  CHF.  Obesity. Lung cancer, s/p lobectomy.  diverticulosis.  Anemia.  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  07/29/2016 end sigmoid colostomy.  Completed radiation therapy 09/25/16. 04/2014 Colonoscopy.  Dr [place] Carbo.  Internal hemorrhoids, sigmoid diverticulosis.    In ED today with weakness, SOB.  sxs onset last week. No presyncope but some dizziness.  No abdominal pain. Only became nauseated this morning but did not vomit.  Stools chronically dark from po iron, no change.  Some BPR in early March 2018.   Hgb 8.0.  9.3 on 3/26.  Baseline ~ 10.  MCV is 91.  Stool in ostomy is brown, FOBT + but pt reports has been black/dark due to taking po iron. Marland Kitchen  BNP elevated.   Iron studies 5 weeks ago unremarkable.   Not on H2 blocker or PPI at home. Not using NSAIDs  Past Medical History:  Diagnosis Date  . 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 Anemia   . Anxiety   . Atrial fibrillation ([place])   . Basal cell carcinoma of skin of nose   . CHF (congestive heart failure) ([place]) 11/2015  . Chronic bronchitis ([place])   . Chronic depression   . Colostomy in place [facility])   . Diverticular disease   . Fibrocystic disease of breast   . Fracture of shoulder   . GERD (gastroesophageal reflux disease)   . Herpes zoster   . History of blood transfusion    "related to emergent  hysterectomy"  . History of hiatal hernia   . Hyperlipidemia   . Hypertension   . Labyrinthitis   . Lung cancer ([place])    RML  . Obesity   . Paroxysmal supraventricular tachycardia ([place])   . Peptic ulcer of stomach    hx  . Pneumonia 1980s X 1; 08/2016   "walking; double"  . Pulmonary embolism ([place]) 05/2014   "left lung"  . Vitamin D deficiency     Past Surgical History:  Procedure Laterality Date  . APPENDECTOMY  1959  . BASAL CELL CARCINOMA EXCISION     nose  . BREAST CYST ASPIRATION Right 2016?  Marland Kitchen CATARACT EXTRACTION W/ INTRAOCULAR LENS  IMPLANT, BILATERAL Bilateral 1990s  . CHOLECYSTECTOMY OPEN  1980s  . COLON SURGERY    . COLOSTOMY N/A 07/29/2016   Procedure: COLOSTOMY;  Surgeon: Clovis Riley, MD;  Location: [facility];  Service: General;  Laterality: N/A;  . DILATION AND CURETTAGE OF UTERUS    . EVALUATION UNDER ANESTHESIA WITH HEMORRHOIDECTOMY AND PROCTOSCOPY N/A 07/25/2016   Procedure: EXAM UNDER  ANESTHESIA, EXCISION PERIANAL MASS.;  Surgeon: Judeth Horn, MD;  Location: [facility];  Service: General;  Laterality: N/A;  Prone position  . LAPAROSCOPIC DIVERTED COLOSTOMY N/A 07/29/2016   Procedure: ATTEMPTED LAPAROSCOPIC ASSISTED COLOSTOMY;  Surgeon: Clovis Riley, MD;  Location: [facility];  Service: General;  Laterality: N/A;  . LUNG REMOVAL, PARTIAL Right 2013   RML mass/carcinoid, Dr Lurena Nida  . TONSILLECTOMY AND ADENOIDECTOMY  1960s  . VAGINAL HYSTERECTOMY  1959   "partial"    Prior to Admission medications   Medication Sig Start Date End Date Taking? Authorizing Provider  acetaminophen (TYLENOL) 325 MG tablet Take 650 mg by mouth every 6 (six) hours as needed for mild pain.   Yes Historical Provider, MD  apixaban (ELIQUIS) 5 MG TABS tablet Take 1 tablet (5 mg total) by mouth 2 (two) times daily. 12/02/16  Yes Evans Lance, MD  atorvastatin (LIPITOR) 20 MG tablet Take 1 tablet (20 mg total) by mouth daily. 11/04/16  Yes [name] [name], NP  budesonide (PULMICORT) 0.25  MG/2ML nebulizer solution Take 2 mLs (0.25 mg total) by nebulization 2 (two) times daily. 11/04/16  Yes Praveen Mannam, MD  Calcium 600-200 MG-UNIT tablet Take 1 tablet by mouth daily.   Yes Historical Provider, MD  dofetilide (TIKOSYN) 500 MCG capsule Take 1 capsule (500 mcg total) by mouth 2 (two) times daily. 10/10/16  Yes Amber Sena Slate, NP  ENSURE PLUS (ENSURE PLUS) LIQD Take 237 mLs by mouth 3 (three) times daily between meals.   Yes Historical Provider, MD  fenofibrate 160 MG tablet Take 1 tablet (160 mg total) by mouth daily. 11/04/16  Yes [name] [name], NP  ferrous sulfate 325 (65 FE) MG tablet Take 1 tablet (325 mg total) by mouth daily with breakfast. Patient taking differently: Take 650 mg by mouth daily with breakfast.  11/05/16  Yes [name] [name], NP  fluticasone (FLONASE) 50 MCG/ACT nasal spray Place 1 spray into both nostrils daily.   Yes Historical Provider, MD  ipratropium-albuterol (DUONEB) 0.5-2.5 (3) MG/3ML SOLN Take 3 mLs by nebulization 2 (two) times daily. 11/14/16  Yes Praveen Mannam, MD  magnesium oxide (MAG-OX) 400 (241.3 Mg) MG tablet Take 1 tablet (400 mg total) by mouth daily. 11/12/16  Yes Evans Lance, MD  metoprolol tartrate (LOPRESSOR) 25 MG tablet Take 1 tablet (25 mg total) by mouth 2 (two) times daily. 11/12/16  Yes Evans Lance, MD  montelukast (SINGULAIR) 10 MG tablet Take 1 tablet (10 mg total) by mouth at bedtime. 11/04/16  Yes [name] [name], NP  MONUROL 3 g PACK Take 1 packet by mouth once. 11/28/16  Yes Historical Provider, MD  PARoxetine (PAXIL) 20 MG tablet Take 20 mg by mouth at bedtime.    Yes Historical Provider, MD    Scheduled Meds:  Infusions:  PRN Meds:    Allergies as of 12/10/2016 - Review Complete 12/10/2016  Allergen Reaction Noted  . Penicillins Anaphylaxis 06/14/2016  . Aleve [naproxen sodium] Hives 07/22/2016  . Antihistamines, chlorpheniramine-type Rash   . Aspirin Rash 06/14/2016  . Benadryl [diphenhydramine hcl]  Palpitations 10/29/2016  . Codeine Rash 06/14/2016  . Hydrochlorothiazide Rash 07/22/2016  . Rocephin [ceftriaxone sodium in dextrose] Rash 06/14/2016  . Sulfa antibiotics Rash 06/14/2016    Family History  Problem Relation Age of Onset  . Heart attack Mother     Dec 1987  . CVA Mother   . Hypertension Mother   . Multiple myeloma Mother   . Breast cancer Sister   .  Skin cancer Sister   . Prostate cancer Brother   . Throat cancer Brother   . Cervical cancer Daughter   . Leukemia Daughter   . Leukemia Son   . Throat cancer Brother   . Cancer Brother     soft palette  . Heart Problems Brother     Pacemaker  . Colon cancer Neg Hx   . Pancreatic cancer Neg Hx     Social History   Social History  . Marital status: Widowed    Spouse name: N/A  . Number of children: N/A  . Years of education: N/A   Occupational History  . Not on file.   Social History Main Topics  . Smoking status: Former Smoker    Packs/day: 0.50    Years: 15.00    Types: Cigarettes    Quit date: 1976  . Smokeless tobacco: Never Used  . Alcohol use No  . Drug use: No  . Sexual activity: No   Other Topics Concern  . Not on file   Social History Narrative  . No narrative on file    REVIEW OF SYSTEMS: Constitutional:  Weakness, fatigue. These problems overall for a few months but definitely worse starting the middle of last week. ENT:  No nose bleeds Pulm:  DOE, no cough. CV:  No palpitations, no LE edema. No chest pain. No tachycardia. GU:  No hematuria, no frequency.  Bladder incontinence. GI:  Per HPI.  No dysphagia.  No N/V Heme:  Bruises fairly easily but nothing very extensive.   Transfusions:  Does not recall ever having transfusions of blood products. Neuro:  No headaches, no peripheral tingling or numbness.  No seizures.  Poor balance and somewhat unsteady gait, she uses a rolling walker for ambulation. Derm:  No itching, no rash or sores.  Endocrine:  No sweats or chills.  No  polyuria or dysuria Immunization:  Did not inquire as to recent immunizations. Travel:  None beyond local counties in last few months.    PHYSICAL EXAM: Vital signs in last 24 hours: Vitals:   12/10/16 1530 12/10/16 1606  BP: (!) 105/46 (!) 111/45  Pulse: 67 64  Resp: (!) 22 19  Temp:  97.3 F (36.3 C)   Wt Readings from Last 3 Encounters:  12/10/16 112 kg (247 lb)  11/12/16 111.1 kg (245 lb)  11/04/16 109.1 kg (240 lb 9.6 oz)    General: Obese, pleasant, fully alert WF.  She looks pale but not acutely ill. Head:  No facial asymmetry or swelling. No signs of head trauma.  Eyes:  No scleral icterus. Conjunctiva pale. Ears:  Not hard of hearing.  Nose:  No discharge, no congestion. Mouth:  Oral mucosa is pink, moist, clear. Tongue midline. Dentition in good repair with some partial plates. Neck:  No JVD, no masses, no thyromegaly. Lungs:  Clear breath sounds bilaterally. No cough. No dyspnea at rest. Heart: Irregularly irregular. Rate controlled. Abdomen:  Soft, obese. No masses, at bedtime, bruits, hernias. Ostomy bag in left lower abdomen with small amount of plaque but not melenic appearing stool..   Rectal: Deferred   Musc/Skeltl: No joint erythema or swelling. No gross articular deformities. Extremities:  No cyanosis or clubbing. Non-pitting edema in the lower extremities. With looks to be symmetric.  Neurologic:  Patient is alert. Oriented 3. A little bit hard of hearing. No tremor. Moves all 4 limbs, strength was not tested. Skin:  No telangectasia, no significant purpura. Tattoos:  none Nodes:  No  cervical adenopathy   Psych:  Pleasant, slightly anxious  Intake/Output from previous day: No intake/output data recorded. Intake/Output this shift: No intake/output data recorded.  LAB RESULTS:  Recent Labs  12/10/16 1005  WBC 4.6  HGB 8.0*  HCT 27.1*  PLT 147*   BMET Lab Results  Component Value Date   NA 140 12/10/2016   NA 140 10/15/2016   NA 139  10/10/2016   K 4.0 12/10/2016   K 4.1 10/15/2016   K 4.2 10/10/2016   CL 105 12/10/2016   CL 102 10/15/2016   CL 104 10/10/2016   CO2 30 12/10/2016   CO2 30 10/15/2016   CO2 31 10/10/2016   GLUCOSE 96 12/10/2016   GLUCOSE 110 (H) 10/15/2016   GLUCOSE 100 (H) 10/10/2016   BUN 15 12/10/2016   BUN 14 10/15/2016   BUN 13 10/10/2016   CREATININE 0.89 12/10/2016   CREATININE 0.83 10/15/2016   CREATININE 0.91 10/10/2016   CALCIUM 8.6 (L) 12/10/2016   CALCIUM 8.9 10/15/2016   CALCIUM 8.0 (L) 10/10/2016   LFT No results for input(s): PROT, ALBUMIN, AST, ALT, ALKPHOS, BILITOT, BILIDIR, IBILI in the last 72 hours. PT/INR Lab Results  Component Value Date   INR 1.30 07/22/2016   Hepatitis Panel No results for input(s): HEPBSAG, HCVAB, HEPAIGM, HEPBIGM in the last 72 hours. C-Diff No components found for: CDIFF Lipase  No results found for: LIPASE  Drugs of Abuse  No results found for: LABOPIA, COCAINSCRNUR, LABBENZ, AMPHETMU, THCU, LABBARB   RADIOLOGY STUDIES: Dg Chest 2 View  Result Date: 12/10/2016 CLINICAL DATA:  Shortness of Breath EXAM: CHEST  2 VIEW COMPARISON:  09/16/2016, 08/26/16 FINDINGS: Cardiac shadow is stable. Scarring is again seen in the right lung consistent with the patient's known prior surgery. Increased pleural based soft tissue density is noted laterally in the right lung. No effusion is seen. No acute infiltrate is noted. No bony abnormality is seen. IMPRESSION: Postsurgical scarring in the right mid lung stable from previous exam. Slight increase in the degree of pleural based density. Nonemergent CT may be helpful for further evaluation Electronically Signed   By: Inez Catalina M.D.   On: 12/10/2016 10:29     IMPRESSION:   *  Acute on chronic anemia with what sounds like upper GI bleed: Dark stools. Has not had significant upper GI symptoms and has never undergone upper endoscopy.  *  Chronic eliquis.  A fib. History PE, DVT.   *  Stage 4 squamous cell  anal cancer, s/p end sigmoidostomy.     PLAN:     *  Cancelled iron studies, they were normal 5 weeks ago.   *  Eat tonight, NPO post midnight.  Diagnostic EGD tomorrow.    Tricia Hernandez  12/10/2016, 4:13 PM Pager: [phone]

## 2016-12-10 NOTE — ED Notes (Signed)
Attempted report 

## 2016-12-10 NOTE — ED Provider Notes (Signed)
[facility] DEPT Provider Note   CSN: [id number] Arrival date & time: 12/10/16  0957   History   Chief Complaint Chief Complaint  Patient presents with  . Shortness of Breath    HPI Tricia Hernandez is a 83 y.o. female.  HPI   83 year old female past medical history of fibrillation, pulmonary embolism, obesity, hypertension, depression, prior lung cancer, anal cancer status post colostomy, diastolic heart failure symptoms class II, presents today with complaints of shortness of breath. Patient recently admitted and discharged to [facility] from the hospital in January 2018 secondary to acute respiratory failure with pneumonia. Patient also noted to have uncontrolled A. fib with RVR has been maintaining normal sinus rhythm on dofetilide.   Patient notes she was doing physical therapy at home and had been doing well.  Both her and her daughter note middle of last week she started having worsening shortness of breath with exertion.  She notes she also has some orthopnea this is inconsistent and not always present.  She notes easily fatigued.  She denies any palpitations or signs of A. fib.  Patient was diagnosed with UTI last week took a dose of antibiotics which seemed to improve her symptoms.  She denies any cough, chest pain, shortness of breath at rest or throughout my evaluation.  Both her and her daughter noticed that she has had some lower extremity swelling to the bilateral lower extremities that seems to improve with elevation, worse on the right.  Eating and drinking appropriately.  Dark black output through ostomy; normal per patient.  No bleeding per rectum.   Past Medical History:  Diagnosis Date  . 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 Anemia   . Anxiety   . Atrial fibrillation ([place])   . Basal cell carcinoma of skin of nose   . CHF (congestive heart failure) ([place]) 11/2015  . Chronic bronchitis ([place])   . Chronic depression   .  Colostomy in place [facility])   . Diverticular disease   . Fibrocystic disease of breast   . Fracture of shoulder   . GERD (gastroesophageal reflux disease)   . Herpes zoster   . History of blood transfusion    "related to emergent hysterectomy"  . History of hiatal hernia   . Hyperlipidemia   . Hypertension   . Labyrinthitis   . Lung cancer ([place])    RML  . Obesity   . Paroxysmal supraventricular tachycardia ([place])   . Peptic ulcer of stomach    hx  . Pneumonia 1980s X 1; 08/2016   "walking; double"  . Pulmonary embolism ([place]) 05/2014   "left lung"  . Vitamin D deficiency     Patient Active Problem List   Diagnosis Date Noted  . GI bleeding 12/10/2016  . Heme positive stool 12/10/2016  . Mixed hyperlipidemia 11/05/2016  . Chronic bronchitis ([place]) 11/04/2016  . Persistent atrial fibrillation ([place]) 10/07/2016  . HCAP (healthcare-associated pneumonia) 08/26/2016  . Acute respiratory failure with hypoxia ([place]) 08/26/2016  . Essential hypertension   . Lung nodule   . Palliative care encounter   . Dyspnea 07/23/2016  . SOB (shortness of breath) 07/22/2016  . Symptomatic anemia 07/22/2016  . Anal cancer ([place]) 07/22/2016  . BRBPR (bright red blood per rectum) 07/22/2016  . Elevated troponin 07/22/2016  . Atrial fibrillation ([place]), CHA2DS2-VASc Score 5 07/22/2016  . Pulmonary embolus ([place])   . Obesity   . Lung cancer ([place])   .  Hypertension     Past Surgical History:  Procedure Laterality Date  . APPENDECTOMY  1959  . BASAL CELL CARCINOMA EXCISION     nose  . BREAST CYST ASPIRATION Right 2016?  Marland Kitchen CATARACT EXTRACTION W/ INTRAOCULAR LENS  IMPLANT, BILATERAL Bilateral 1990s  . CHOLECYSTECTOMY OPEN  1980s  . COLON SURGERY    . COLOSTOMY N/A 07/29/2016   Procedure: COLOSTOMY;  Surgeon: Clovis Riley, MD;  Location: [facility];  Service: General;  Laterality: N/A;  . DILATION AND CURETTAGE OF UTERUS    . EVALUATION UNDER ANESTHESIA WITH HEMORRHOIDECTOMY AND PROCTOSCOPY N/A  07/25/2016   Procedure: EXAM UNDER ANESTHESIA, EXCISION PERIANAL MASS.;  Surgeon: Judeth Horn, MD;  Location: [facility];  Service: General;  Laterality: N/A;  Prone position  . LAPAROSCOPIC DIVERTED COLOSTOMY N/A 07/29/2016   Procedure: ATTEMPTED LAPAROSCOPIC ASSISTED COLOSTOMY;  Surgeon: Clovis Riley, MD;  Location: [facility];  Service: General;  Laterality: N/A;  . LUNG REMOVAL, PARTIAL Right 2013   RML mass/carcinoid, Dr Lurena Nida  . TONSILLECTOMY AND ADENOIDECTOMY  1960s  . VAGINAL HYSTERECTOMY  1959   "partial"    OB History    No data available       Home Medications    Prior to Admission medications   Medication Sig Start Date End Date Taking? Authorizing Provider  acetaminophen (TYLENOL) 325 MG tablet Take 650 mg by mouth every 6 (six) hours as needed for mild pain.   Yes Historical Provider, MD  apixaban (ELIQUIS) 5 MG TABS tablet Take 1 tablet (5 mg total) by mouth 2 (two) times daily. 12/02/16  Yes Evans Lance, MD  atorvastatin (LIPITOR) 20 MG tablet Take 1 tablet (20 mg total) by mouth daily. 11/04/16  Yes [name] [name], NP  budesonide (PULMICORT) 0.25 MG/2ML nebulizer solution Take 2 mLs (0.25 mg total) by nebulization 2 (two) times daily. 11/04/16  Yes Praveen Mannam, MD  Calcium 600-200 MG-UNIT tablet Take 1 tablet by mouth daily.   Yes Historical Provider, MD  dofetilide (TIKOSYN) 500 MCG capsule Take 1 capsule (500 mcg total) by mouth 2 (two) times daily. 10/10/16  Yes Amber Sena Slate, NP  ENSURE PLUS (ENSURE PLUS) LIQD Take 237 mLs by mouth 3 (three) times daily between meals.   Yes Historical Provider, MD  fenofibrate 160 MG tablet Take 1 tablet (160 mg total) by mouth daily. 11/04/16  Yes [name] [name], NP  ferrous sulfate 325 (65 FE) MG tablet Take 1 tablet (325 mg total) by mouth daily with breakfast. Patient taking differently: Take 650 mg by mouth daily with breakfast.  11/05/16  Yes [name] [name], NP  fluticasone (FLONASE) 50 MCG/ACT nasal spray Place 1  spray into both nostrils daily.   Yes Historical Provider, MD  ipratropium-albuterol (DUONEB) 0.5-2.5 (3) MG/3ML SOLN Take 3 mLs by nebulization 2 (two) times daily. 11/14/16  Yes Praveen Mannam, MD  magnesium oxide (MAG-OX) 400 (241.3 Mg) MG tablet Take 1 tablet (400 mg total) by mouth daily. 11/12/16  Yes Evans Lance, MD  metoprolol tartrate (LOPRESSOR) 25 MG tablet Take 1 tablet (25 mg total) by mouth 2 (two) times daily. 11/12/16  Yes Evans Lance, MD  montelukast (SINGULAIR) 10 MG tablet Take 1 tablet (10 mg total) by mouth at bedtime. 11/04/16  Yes [name] [name], NP  MONUROL 3 g PACK Take 1 packet by mouth once. 11/28/16  Yes Historical Provider, MD  PARoxetine (PAXIL) 20 MG tablet Take 20 mg by mouth at bedtime.  Yes Historical Provider, MD    Family History Family History  Problem Relation Age of Onset  . Heart attack Mother     Dec 1987  . CVA Mother   . Hypertension Mother   . Multiple myeloma Mother   . Breast cancer Sister   . Skin cancer Sister   . Prostate cancer Brother   . Throat cancer Brother   . Cervical cancer Daughter   . Leukemia Daughter   . Leukemia Son   . Throat cancer Brother   . Cancer Brother     soft palette  . Heart Problems Brother     Pacemaker  . Colon cancer Neg Hx   . Pancreatic cancer Neg Hx     Social History Social History  Substance Use Topics  . Smoking status: Former Smoker    Packs/day: 0.50    Years: 15.00    Types: Cigarettes    Quit date: 1976  . Smokeless tobacco: Never Used  . Alcohol use No     Allergies   Penicillins; Aleve [naproxen sodium]; Antihistamines, chlorpheniramine-type; Aspirin; Benadryl [diphenhydramine hcl]; Codeine; Hydrochlorothiazide; Rocephin [ceftriaxone sodium in dextrose]; and Sulfa antibiotics  Review of Systems Review of Systems  All other systems reviewed and are negative.  Physical Exam Updated Vital Signs BP (!) 105/46   Pulse 67   Temp 97.5 F (36.4 C) (Oral)   Resp (!) 22    SpO2 100%   Physical Exam  Constitutional: She is oriented to person, place, and time. She appears well-developed and well-nourished.  HENT:  Head: Normocephalic and atraumatic.  Eyes: Conjunctivae are normal. Pupils are equal, round, and reactive to light. Right eye exhibits no discharge. Left eye exhibits no discharge. No scleral icterus.  Neck: Normal range of motion. No JVD present. No tracheal deviation present.  Pulmonary/Chest: Effort normal. No stridor.  Faint crackles in bilateral lower lobes   Abdominal: There is tenderness.  Ostomy with small inferior irritation no infection  Musculoskeletal: She exhibits no edema.  Minor lower extremitiy edema   Neurological: She is alert and oriented to person, place, and time. Coordination normal.  Psychiatric: She has a normal mood and affect. Her behavior is normal. Judgment and thought content normal.  Nursing note and vitals reviewed.   ED Treatments / Results  Labs (all labs ordered are listed, but only abnormal results are displayed) Labs Reviewed  BASIC METABOLIC PANEL - Abnormal; Notable for the following:       Result Value   Calcium 8.6 (*)    GFR calc non Af Amer 58 (*)    All other components within normal limits  CBC - Abnormal; Notable for the following:    RBC 2.96 (*)    Hemoglobin 8.0 (*)    HCT 27.1 (*)    MCHC 29.5 (*)    RDW 18.6 (*)    Platelets 147 (*)    All other components within normal limits  BRAIN NATRIURETIC PEPTIDE - Abnormal; Notable for the following:    B Natriuretic Peptide 328.7 (*)    All other components within normal limits  POC OCCULT BLOOD, ED - Abnormal; Notable for the following:    Fecal Occult Bld POSITIVE (*)    All other components within normal limits  URINALYSIS, ROUTINE W REFLEX MICROSCOPIC  I-STAT TROPOININ, ED   EKG  EKG Interpretation  Date/Time:  Tuesday Dec 10 2016 10:05:20 EDT Ventricular Rate:  67 PR Interval:  154 QRS Duration: 74 QT Interval:  440 QTC  Calculation: 464 R Axis:   51 Text Interpretation:  Sinus rhythm with Premature atrial complexes with Abberant conduction Otherwise normal ECG when compared to prior, T waves flattened in leads 1, 2, V2 and now upward in 3 NO stemi Confirmed by [facility] MD, [place] [phone]) on 12/10/2016 11:23:53 AM       Radiology Dg Chest 2 View  Result Date: 12/10/2016 CLINICAL DATA:  Shortness of Breath EXAM: CHEST  2 VIEW COMPARISON:  09/16/2016, 08/26/16 FINDINGS: Cardiac shadow is stable. Scarring is again seen in the right lung consistent with the patient's known prior surgery. Increased pleural based soft tissue density is noted laterally in the right lung. No effusion is seen. No acute infiltrate is noted. No bony abnormality is seen. IMPRESSION: Postsurgical scarring in the right mid lung stable from previous exam. Slight increase in the degree of pleural based density. Nonemergent CT may be helpful for further evaluation Electronically Signed   By: Inez Catalina M.D.   On: 12/10/2016 10:29    Procedures Procedures (including critical care time)  Medications Ordered in ED Medications - No data to display   Initial Impression / Assessment and Plan / ED Course  I have reviewed the triage vital signs and the nursing notes.  Pertinent labs & imaging results that were available during my care of the patient were reviewed by me and considered in my medical decision making (see chart for details).     Final Clinical Impressions(s) / ED Diagnoses   Final diagnoses:  Symptomatic anemia   83 year old female presents today with complaints of shortness of breath.  Symptoms have worsened over the last several days.  Patients presentation likely secondary to symptomatic anemia.  Her hemoglobin here is 8.0, down from 9.91 month ago.  Patient has an ostomy bag with dark stool in it, she reports this is normal for her as she takes iron supplements for chronic anemia.  I personally ambulated the patient he was  significantly weak and dyspneic upon short ambulation. 02 staturadtion down to 89 with ambulation no wheeze noted.   Patient is asymptomatic at rest.  I have low suspicion for PE in this patient.  Patient is currently on Eliquis now.  [facility] consulted for admission  New Prescriptions New Prescriptions   No medications on file     Okey Regal, PA-C 12/10/16 1546    Tegeler, Gwenyth Allegra, MD 12/13/16 [phone]

## 2016-12-10 NOTE — ED Notes (Signed)
Patient transported to X-ray 

## 2016-12-10 NOTE — Progress Notes (Signed)
Pt troponin result has been paged to [place] at 1950

## 2016-12-10 NOTE — Progress Notes (Signed)
*  PRELIMINARY RESULTS* Vascular Ultrasound Right lower extremity venous duplex has been completed.  Preliminary findings: No evidence of deep vein thrombosis in the visualized veins of the right lower extremity.  Hypoechoic area seen in right groin of unknown etiology, possible lymph node? Small bakers cyst noted. Nothing seen lateral right thigh, area of pain.  Everrett Coombe 12/10/2016, 12:25 PM

## 2016-12-10 NOTE — H&P (Signed)
 History and Physical  Tawnya Gabay MRN:[id number] DOB: 07/12/1931 DOA: 12/10/2016  Referring physician: [facility] PCP: Charlotte [name], NP  Outpatient Specialists:  1. Jennifer Lemmon PA-C [facility]   Chief Complaint: Shortness of breath  HPI: Tricia Hernandez is a 83 y.o. female with known past medical history of fibrillation on xarelto, pulmonary embolism, obesity, hypertension, depression, prior lung cancer, anal cancer status post colostomy, diastolic heart failure symptoms class II, presents with complaints of shortness of breath that has progressed over last 2 weeks.  She gets severe SOB mostly with ambulation and activities of daily living.  She is basically not SOB when resting.  She has SOB with lying recumbent and reports increased edema in the legs.  Patient recently admitted and discharged to SNF from the hospital in January 2018 secondary to acute respiratory failure with pneumonia. Patient also noted to have uncontrolled A. fib with RVR has been maintaining normal sinus rhythm on dofetilide.   Patient notes she was doing physical therapy at home and had been doing well with the exception of noticeable SOB with activities.  Both her and her daughter note middle of last week she started having worsening shortness of breath with exertion.  She notes she also has some orthopnea.  She notes easily fatigued.  She denies any palpitations.  Patient was diagnosed with UTI last week took a dose of antibiotics which seemed to improve her symptoms.  She denies any cough, chest pain, shortness of breath at rest or throughout my evaluation.  Both her and her daughter noticed that she has had some lower extremity swelling to the bilateral lower extremities that seems to improve with elevation, worse on the right. Eating and drinking appropriately.  Dark black output through ostomy; normal per patient.  No bleeding per rectum.  Her stool tested hemoccult positive.   She normally has black stools due to taking  iron tablets daily and no unusual smell to stools.  She was noted to have a Hg drop from 10 to 8 since January 2018.  She had a negative Doppler study of lower extremity to rule out DVT.  She had a normal troponin, mildly elevated BNP and normal CXR and EKG.   Review of Systems: All systems reviewed and apart from history of presenting illness, are negative.  Past Medical History:  Diagnosis Date  . Anal squamous cell carcinoma (HCC)    "spread to lymph nodes and groins" (10/08/2016)  . Anemia   . Anxiety   . Atrial fibrillation (HCC)   . Basal cell carcinoma of skin of nose   . CHF (congestive heart failure) (HCC) 11/2015  . Chronic bronchitis (HCC)   . Chronic depression   . Colostomy in place (HCC)   . Diverticular disease   . Fibrocystic disease of breast   . Fracture of shoulder   . GERD (gastroesophageal reflux disease)   . Herpes zoster   . History of blood transfusion    "related to emergent hysterectomy"  . History of hiatal hernia   . Hyperlipidemia   . Hypertension   . Labyrinthitis   . Lung cancer (HCC)    RML  . Obesity   . Paroxysmal supraventricular tachycardia (HCC)   . Peptic ulcer of stomach    hx  . Pneumonia 1980s X 1; 08/2016   "walking; double"  . Pulmonary embolism (HCC) 05/2014   "left lung"  . Vitamin D deficiency    Past Surgical History:  Procedure Laterality Date  . APPENDECTOMY    1959  . BASAL CELL CARCINOMA EXCISION     nose  . BREAST CYST ASPIRATION Right 2016?  . CATARACT EXTRACTION W/ INTRAOCULAR LENS  IMPLANT, BILATERAL Bilateral 1990s  . CHOLECYSTECTOMY OPEN  1980s  . COLON SURGERY    . COLOSTOMY N/A 07/29/2016   Procedure: COLOSTOMY;  Surgeon: Chelsea A Connor, MD;  Location: [facility] OR;  Service: General;  Laterality: N/A;  . DILATION AND CURETTAGE OF UTERUS    . EVALUATION UNDER ANESTHESIA WITH HEMORRHOIDECTOMY AND PROCTOSCOPY N/A 07/25/2016   Procedure: EXAM UNDER ANESTHESIA, EXCISION PERIANAL MASS.;  Surgeon: James Wyatt, MD;   Location: [facility] OR;  Service: General;  Laterality: N/A;  Prone position  . LAPAROSCOPIC DIVERTED COLOSTOMY N/A 07/29/2016   Procedure: ATTEMPTED LAPAROSCOPIC ASSISTED COLOSTOMY;  Surgeon: Chelsea A Connor, MD;  Location: [facility] OR;  Service: General;  Laterality: N/A;  . LUNG REMOVAL, PARTIAL Right 2013   RML mass/carcinoid, Dr Sweezer  . TONSILLECTOMY AND ADENOIDECTOMY  1960s  . VAGINAL HYSTERECTOMY  1959   "partial"   Social History:  reports that she quit smoking about 42 years ago. Her smoking use included Cigarettes. She has a 7.50 pack-year smoking history. She has never used smokeless tobacco. She reports that she does not drink alcohol or use drugs.   Allergies  Allergen Reactions  . Penicillins Anaphylaxis    Reaction:  Unknown  Has patient had a PCN reaction causing immediate rash, facial/tongue/throat swelling, SOB or lightheadedness with hypotension: Unsure Has patient had a PCN reaction causing severe rash involving mucus membranes or skin necrosis: Unsure Has patient had a PCN reaction that required hospitalization Unsure Has patient had a PCN reaction occurring within the last 10 years: Unsure If all of the above answers are "NO", then may proceed with Cephalosporin use.   . Aleve [Naproxen Sodium] Hives  . Antihistamines, Chlorpheniramine-Type Rash    Reaction:  Unknown   . Aspirin Rash  . Benadryl [Diphenhydramine Hcl] Palpitations    Increases heart rate  . Codeine Rash  . Hydrochlorothiazide Rash  . Rocephin [Ceftriaxone Sodium In Dextrose] Rash  . Sulfa Antibiotics Rash    Family History  Problem Relation Age of Onset  . Heart attack Mother     Dec 1987  . CVA Mother   . Hypertension Mother   . Multiple myeloma Mother   . Breast cancer Sister   . Skin cancer Sister   . Prostate cancer Brother   . Throat cancer Brother   . Cervical cancer Daughter   . Leukemia Daughter   . Leukemia Son   . Throat cancer Brother   . Cancer Brother     soft palette  .  Heart Problems Brother     Pacemaker  . Colon cancer Neg Hx   . Pancreatic cancer Neg Hx     Prior to Admission medications   Medication Sig Start Date End Date Taking? Authorizing Provider  acetaminophen (TYLENOL) 325 MG tablet Take 650 mg by mouth every 6 (six) hours as needed for mild pain.   Yes Historical Provider, MD  apixaban (ELIQUIS) 5 MG TABS tablet Take 1 tablet (5 mg total) by mouth 2 (two) times daily. 12/02/16  Yes Gregg W Taylor, MD  atorvastatin (LIPITOR) 20 MG tablet Take 1 tablet (20 mg total) by mouth daily. 11/04/16  Yes Charlotte Lum [name], NP  budesonide (PULMICORT) 0.25 MG/2ML nebulizer solution Take 2 mLs (0.25 mg total) by nebulization 2 (two) times daily. 11/04/16  Yes Praveen Mannam, MD  Calcium 600-200   MG-UNIT tablet Take 1 tablet by mouth daily.   Yes Historical Provider, MD  dofetilide (TIKOSYN) 500 MCG capsule Take 1 capsule (500 mcg total) by mouth 2 (two) times daily. 10/10/16  Yes Amber K Seiler, NP  ENSURE PLUS (ENSURE PLUS) LIQD Take 237 mLs by mouth 3 (three) times daily between meals.   Yes Historical Provider, MD  fenofibrate 160 MG tablet Take 1 tablet (160 mg total) by mouth daily. 11/04/16  Yes Charlotte Lum [name], NP  ferrous sulfate 325 (65 FE) MG tablet Take 1 tablet (325 mg total) by mouth daily with breakfast. Patient taking differently: Take 650 mg by mouth daily with breakfast.  11/05/16  Yes Charlotte Lum [name], NP  fluticasone (FLONASE) 50 MCG/ACT nasal spray Place 1 spray into both nostrils daily.   Yes Historical Provider, MD  ipratropium-albuterol (DUONEB) 0.5-2.5 (3) MG/3ML SOLN Take 3 mLs by nebulization 2 (two) times daily. 11/14/16  Yes Praveen Mannam, MD  magnesium oxide (MAG-OX) 400 (241.3 Mg) MG tablet Take 1 tablet (400 mg total) by mouth daily. 11/12/16  Yes Gregg W Taylor, MD  metoprolol tartrate (LOPRESSOR) 25 MG tablet Take 1 tablet (25 mg total) by mouth 2 (two) times daily. 11/12/16  Yes Gregg W Taylor, MD  montelukast (SINGULAIR) 10 MG tablet  Take 1 tablet (10 mg total) by mouth at bedtime. 11/04/16  Yes Charlotte Lum [name], NP  MONUROL 3 g PACK Take 1 packet by mouth once. 11/28/16  Yes Historical Provider, MD  PARoxetine (PAXIL) 20 MG tablet Take 20 mg by mouth at bedtime.    Yes Historical Provider, MD   Physical Exam: Vitals:   12/10/16 1300 12/10/16 1315 12/10/16 1347 12/10/16 1348  BP: (!) 101/44 (!) 103/50  (!) 112/57  Pulse: 61 64 (!) 107   Resp: 15 20    Temp:      TempSrc:      SpO2: 94% 97% 97%      General exam: Well nourished patient, lying comfortably supine on the gurney in no obvious distress. Cooperative.   Head, eyes and ENT: Nontraumatic and normocephalic. Pupils equally reacting to light and accommodation. Oral mucosa moist.  Neck: Supple. No JVD, carotid bruit or thyromegaly.  Respiratory system:  No increased work of breathing.  No crackles wheeze or rhonchi heard.   Cardiovascular system: S1 and S2 heard. No JVD.  Gastrointestinal system: Abdomen is nondistended, soft and nontender. Ostomy patent and viable with soft black stool seen. Normal bowel sounds heard. No organomegaly or masses appreciated.  Central nervous system: Alert and oriented. No focal neurological deficits.  Extremities: 2+ edema bilateral LEs.   Skin: No rashes or acute findings.  Psychiatry: Pleasant and cooperative.  Labs on Admission:  Basic Metabolic Panel:  Recent Labs Lab 12/10/16 1005  NA 140  K 4.0  CL 105  CO2 30  GLUCOSE 96  BUN 15  CREATININE 0.89  CALCIUM 8.6*   Liver Function Tests: No results for input(s): AST, ALT, ALKPHOS, BILITOT, PROT, ALBUMIN in the last 168 hours. No results for input(s): LIPASE, AMYLASE in the last 168 hours. No results for input(s): AMMONIA in the last 168 hours. CBC:  Recent Labs Lab 12/10/16 1005  WBC 4.6  HGB 8.0*  HCT 27.1*  MCV 91.6  PLT 147*   Cardiac Enzymes: No results for input(s): CKTOTAL, CKMB, CKMBINDEX, TROPONINI in the last 168 hours.  BNP (last  3 results) No results for input(s): PROBNP in the last 8760 hours. CBG: No results for input(s):   GLUCAP in the last 168 hours.  Radiological Exams on Admission: Dg Chest 2 View  Result Date: 12/10/2016 CLINICAL DATA:  Shortness of Breath EXAM: CHEST  2 VIEW COMPARISON:  09/16/2016, 08/26/16 FINDINGS: Cardiac shadow is stable. Scarring is again seen in the right lung consistent with the patient's known prior surgery. Increased pleural based soft tissue density is noted laterally in the right lung. No effusion is seen. No acute infiltrate is noted. No bony abnormality is seen. IMPRESSION: Postsurgical scarring in the right mid lung stable from previous exam. Slight increase in the degree of pleural based density. Nonemergent CT may be helpful for further evaluation Electronically Signed   By: Inez Catalina M.D.   On: 12/10/2016 10:29   EKG: Independently reviewed. SR with PACs  Assessment/Plan Principal Problem:   GI bleeding Active Problems:   SOB (shortness of breath)   Hypertension   Symptomatic anemia   Atrial fibrillation (HCC), CHA2DS2-VASc Score 5   Persistent atrial fibrillation (HCC)   Chronic bronchitis (HCC)   Heme positive stool  1. GI bleeding - Pt's stool tested hemoccult positive. Pt says she cannot remember ever having an EGD.  She is followed by Ellouise Newer at [facility]. Will consult them to see her.  Hold Xarelto for now.  Type and screen.  Follow CBC. Order protonix.  Monitor on telemetry.   2. Dyspnea and progressive SOB - This is likely a multifactorial problem but would check TSH, cycle troponin, monitor tele, check echocardiogram, type and screen for blood.  PT evaluation.  3. Atrial Fibrillation - Pt recently started on tikosyn by EP cardiologist and followed at [facility] clinic.  Currently in SR.  Holding Xarelto as above.  4. Hypertension - blood pressures well controlled on home medications.  5. Heme Positive Stool - Holding Xarelto, request GI consult. Follow CBC.       DVT Prophylaxis: SCD Code Status: full   Family Communication: daughter at bedside  Disposition Plan: TBD   Time spent: 25 mins  Irwin Brakeman, MD [facility] Pager [phone]  If 7PM-7AM, please contact night-coverage [email or web address] Password [email or web address] 12/10/2016, 2:58 PM

## 2016-12-10 NOTE — ED Notes (Signed)
Patient transported to x-ray. ?

## 2016-12-10 NOTE — ED Triage Notes (Signed)
Pt reports sob with exertion and orthopnea as well as swelling to bilateral lower extremities and abdomen since Sunday. resp e/u, pt denies chest pain, speaking in clear sentences.

## 2016-12-10 NOTE — Progress Notes (Signed)
CRITICAL VALUE ALERT  Critical value received:  Troponin 0.07  Date of notification:  12/10/2016  Time of notification:  1949  Critical value read back: yes  Nurse who received alert: Ranelle Oyster  MD notified (1st page):  Baltazar Najjar  Time of first page:  1950  MD notified (2nd page):  Time of second page:  Responding MD:  Awaiting response  Time MD responded:

## 2016-12-10 NOTE — ED Notes (Signed)
Pt returned to room and placed back on monitor.

## 2016-12-11 ENCOUNTER — Encounter (HOSPITAL_COMMUNITY)
Admission: EM | Disposition: A | Discharge status: Skilled Nursing Facility | Payer: Self-pay | Source: Home / Self Care | Attending: Internal Medicine

## 2016-12-11 ENCOUNTER — Inpatient Hospital Stay (HOSPITAL_COMMUNITY): Payer: Medicare Other | Admitting: Certified Registered Nurse Anesthetist

## 2016-12-11 ENCOUNTER — Encounter (HOSPITAL_COMMUNITY): Payer: Self-pay | Admitting: Certified Registered Nurse Anesthetist

## 2016-12-11 ENCOUNTER — Other Ambulatory Visit (HOSPITAL_COMMUNITY): Payer: Medicare Other

## 2016-12-11 DIAGNOSIS — K31819 Angiodysplasia of stomach and duodenum without bleeding: Secondary | ICD-10-CM

## 2016-12-11 DIAGNOSIS — D649 Anemia, unspecified: Secondary | ICD-10-CM

## 2016-12-11 DIAGNOSIS — J42 Unspecified chronic bronchitis: Secondary | ICD-10-CM

## 2016-12-11 DIAGNOSIS — K31811 Angiodysplasia of stomach and duodenum with bleeding: Principal | ICD-10-CM

## 2016-12-11 HISTORY — PX: ESOPHAGOGASTRODUODENOSCOPY: SHX5428

## 2016-12-11 LAB — MRSA PCR SCREENING: MRSA BY PCR: NEGATIVE

## 2016-12-11 LAB — URINALYSIS, ROUTINE W REFLEX MICROSCOPIC
BILIRUBIN URINE: NEGATIVE
Bacteria, UA: NONE SEEN
Glucose, UA: NEGATIVE mg/dL
HGB URINE DIPSTICK: NEGATIVE
Ketones, ur: NEGATIVE mg/dL
NITRITE: NEGATIVE
PH: 6 (ref 5.0–8.0)
Protein, ur: NEGATIVE mg/dL
SPECIFIC GRAVITY, URINE: 1.004 — AB (ref 1.005–1.030)

## 2016-12-11 LAB — CBC
HCT: 27 % — ABNORMAL LOW (ref 36.0–46.0)
HEMATOCRIT: 23.7 % — AB (ref 36.0–46.0)
HEMOGLOBIN: 7.1 g/dL — AB (ref 12.0–15.0)
Hemoglobin: 8.3 g/dL — ABNORMAL LOW (ref 12.0–15.0)
MCH: 27.3 pg (ref 26.0–34.0)
MCH: 27.7 pg (ref 26.0–34.0)
MCHC: 30 g/dL (ref 30.0–36.0)
MCHC: 30.7 g/dL (ref 30.0–36.0)
MCV: 90 fL (ref 78.0–100.0)
MCV: 91.2 fL (ref 78.0–100.0)
PLATELETS: 101 10*3/uL — AB (ref 150–400)
Platelets: 122 10*3/uL — ABNORMAL LOW (ref 150–400)
RBC: 2.6 MIL/uL — ABNORMAL LOW (ref 3.87–5.11)
RBC: 3 MIL/uL — ABNORMAL LOW (ref 3.87–5.11)
RDW: 18.8 % — ABNORMAL HIGH (ref 11.5–15.5)
RDW: 19.7 % — AB (ref 11.5–15.5)
WBC: 3.5 10*3/uL — AB (ref 4.0–10.5)
WBC: 3.4 10*3/uL — AB (ref 4.0–10.5)

## 2016-12-11 LAB — PROTIME-INR
INR: 1.34
Prothrombin Time: 16.7 seconds — ABNORMAL HIGH (ref 11.4–15.2)

## 2016-12-11 LAB — COMPREHENSIVE METABOLIC PANEL
ALBUMIN: 2.2 g/dL — AB (ref 3.5–5.0)
ALK PHOS: 40 U/L (ref 38–126)
ALT: 12 U/L — AB (ref 14–54)
AST: 20 U/L (ref 15–41)
Anion gap: 6 (ref 5–15)
BILIRUBIN TOTAL: 0.3 mg/dL (ref 0.3–1.2)
BUN: 15 mg/dL (ref 6–20)
CALCIUM: 8.6 mg/dL — AB (ref 8.9–10.3)
CO2: 29 mmol/L (ref 22–32)
CREATININE: 0.95 mg/dL (ref 0.44–1.00)
Chloride: 105 mmol/L (ref 101–111)
GFR calc Af Amer: >60 mL/min (ref >60)
GFR calc non Af Amer: 54 mL/min — ABNORMAL LOW (ref >60)
GLUCOSE: 115 mg/dL — AB (ref 65–99)
Potassium: 4 mmol/L (ref 3.5–5.1)
Sodium: 140 mmol/L (ref 135–145)
TOTAL PROTEIN: 4.7 g/dL — AB (ref 6.5–8.1)

## 2016-12-11 LAB — VITAMIN D 25 HYDROXY (VIT D DEFICIENCY, FRACTURES): VIT D 25 HYDROXY: 35.6 ng/mL (ref 30.0–100.0)

## 2016-12-11 LAB — FOLATE: Folate: 20.1 ng/mL (ref >5.9)

## 2016-12-11 LAB — TROPONIN I: Troponin I: 0.07 ng/mL (ref <0.03)

## 2016-12-11 LAB — TSH: TSH: 1.558 u[IU]/mL (ref 0.350–4.500)

## 2016-12-11 LAB — PREPARE RBC (CROSSMATCH)

## 2016-12-11 LAB — APTT: aPTT: 29 seconds (ref 24–36)

## 2016-12-11 SURGERY — EGD (ESOPHAGOGASTRODUODENOSCOPY)
Anesthesia: Monitor Anesthesia Care

## 2016-12-11 MED ORDER — PHENYLEPHRINE 40 MCG/ML (10ML) SYRINGE FOR IV PUSH (FOR BLOOD PRESSURE SUPPORT)
PREFILLED_SYRINGE | INTRAVENOUS | Status: DC | PRN
Start: 2016-12-11 — End: 2016-12-11
  Administered 2016-12-11 (×3): 80 ug via INTRAVENOUS
  Administered 2016-12-11: 120 ug via INTRAVENOUS

## 2016-12-11 MED ORDER — SODIUM CHLORIDE 0.9 % IV SOLN: 10.0000 mL/h | Freq: Once | INTRAVENOUS | Status: DC

## 2016-12-11 MED ORDER — LACTATED RINGERS IV SOLN
INTRAVENOUS | Status: DC
  Administered 2016-12-11 (×2): via INTRAVENOUS

## 2016-12-11 MED ORDER — POTASSIUM CHLORIDE CRYS ER 20 MEQ PO TBCR
20.0000 meq | EXTENDED_RELEASE_TABLET | Freq: Two times a day (BID) | ORAL | Status: DC
  Administered 2016-12-11 – 2016-12-13 (×6): 20 meq via ORAL
  Filled 2016-12-11 (×7): qty 1

## 2016-12-11 MED ORDER — FUROSEMIDE 10 MG/ML IJ SOLN
40.0000 mg | Freq: Two times a day (BID) | INTRAMUSCULAR | Status: AC
  Administered 2016-12-11 – 2016-12-12 (×2): 40 mg via INTRAVENOUS
  Filled 2016-12-11 (×2): qty 4

## 2016-12-11 MED ORDER — PROPOFOL 10 MG/ML IV BOLUS
INTRAVENOUS | Status: DC | PRN
  Administered 2016-12-11: 30 mg via INTRAVENOUS
  Administered 2016-12-11: 80 mg via INTRAVENOUS
  Administered 2016-12-11: 30 mg via INTRAVENOUS
  Administered 2016-12-11: 60 mg via INTRAVENOUS
  Administered 2016-12-11: 25 mg via INTRAVENOUS

## 2016-12-11 NOTE — Op Note (Signed)
[facility] Patient Name: Tricia Hernandez Procedure Date : 12/11/2016 MRN: [id number] Attending MD: Jerene Bears , MD Date of Birth: July 27, 1931 CSN: [id number] Age: 83 Admit Type: Inpatient Procedure:                Upper GI endoscopy Indications:              Acute post hemorrhagic anemia, Heme positive stool Providers:                Lajuan Lines. Hilarie Fredrickson, MD, Cleda Daub, RN, Tinnie Gens,                            Technician, Corliss Parish, Technician Referring MD:             [facility] Medicines:                Monitored Anesthesia Care Complications:            No immediate complications. Estimated Blood Loss:     Estimated blood loss: none. Procedure:                Pre-Anesthesia Assessment:                           - Prior to the procedure, a History and Physical                            was performed, and patient medications and                            allergies were reviewed. The patient's tolerance of                            previous anesthesia was also reviewed. The risks                            and benefits of the procedure and the sedation                            options and risks were discussed with the patient.                            All questions were answered, and informed consent                            was obtained. Prior Anticoagulants: The patient has                            taken Eliquis (apixaban), last dose was 2 days                            prior to procedure. ASA Grade Assessment: III - A                            patient with severe systemic disease. After  reviewing the risks and benefits, the patient was                            deemed in satisfactory condition to undergo the                            procedure.                           After obtaining informed consent, the endoscope was                            passed under direct vision. Throughout the           procedure, the patient's blood pressure, pulse, and                            oxygen saturations were monitored continuously. The                            [id number] ([id number]) scope was introduced through the                            mouth, and advanced to the third part of duodenum.                            The upper GI endoscopy was accomplished without                            difficulty. The patient tolerated the procedure                            well. Scope In: Scope Out: Findings:      LA Grade A (one or more mucosal breaks less than 5 mm, not extending       between tops of 2 mucosal folds) esophagitis with no bleeding was found       at the gastroesophageal junction.      The exam of the esophagus was otherwise normal.      Patchy mild inflammation characterized by erosions and erythema was       found at the incisura and in the gastric antrum. No bleeding found from       the stomach.      The cardia and gastric fundus were normal on retroflexion.      Three 1 to 5 mm angiodysplastic lesions with bleeding on contact from       the larger lesion (bulb) were found in the duodenal bulb (1) and in the       second portion of the duodenum (2). Fulguration to ablate the lesion to       prevent bleeding by argon plasma (Erbe right colon setting) was       successful.      The exam of the duodenum was otherwise normal. No evidence of active or       recent bleeding found today. Impression:               - LA Grade A reflux esophagitis.                           -  Mild, non-hemorrhagic gastritis.                           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                            Ablated with APC.                           - Otherwise normal examined duodenum. Moderate Sedation:      N/A Recommendation:           - Return patient to hospital ward for ongoing care.                           - Full liquid diet. Can advance if tolerated and no                             further evidence of GI bleeding.                           - Continue present medications, but hold Eliquis.                           - Once daily oral PPI.                           - 1 unit pRBC given prior/during EGD. Follow-up Hgb                            to assess response, monitor for evidence of ongoing                            bleeding.                           - Additional angiodysplastic lesions are possible                            in the more distal small bowel, which could/would                            pose a bleeding risk in the setting of chronic                            anticoagulation (Eliquis). Would recommend                            conversation regarding the risk/benefit of resuming                            Eliquis going forward. If further bleeding or                            progressive anemia, video capsule endoscopy +/-  colonoscopy should be considered (last colonoscopy                            was 2 years ago in [place], [place] with Dr. West Carbo). Procedure Code(s):        --- Professional ---                           [phone], Esophagogastroduodenoscopy, flexible,                            transoral; with control of bleeding, any method Diagnosis Code(s):        --- Professional ---                           K21.0, Gastro-esophageal reflux disease with                            esophagitis                           K29.70, Gastritis, unspecified, without bleeding                           K31.819, Angiodysplasia of stomach and duodenum                            without bleeding                           D62, Acute posthemorrhagic anemia                           R19.5, Other fecal abnormalities CPT copyright 2016 [employer]. All rights reserved. The codes documented in this report are preliminary and upon coder review may  be revised to meet current compliance requirements. Jerene Bears, MD 12/11/2016 11:07:50 AM This report has been signed electronically. Number of Addenda: 0

## 2016-12-11 NOTE — Anesthesia Procedure Notes (Signed)
Procedure Name: MAC Performed by: Yianna Tersigni P Pre-anesthesia Checklist: Patient identified, Emergency Drugs available, Suction available, Patient being monitored and Timeout performed Patient Re-evaluated:Patient Re-evaluated prior to inductionOxygen Delivery Method: Nasal cannula Intubation Type: IV induction Placement Confirmation: positive ETCO2 Dental Injury: Teeth and Oropharynx as per pre-operative assessment        

## 2016-12-11 NOTE — Progress Notes (Signed)
PROGRESS NOTE    Tricia Hernandez  BMW:[id number] DOB: September 02, 1930 DOA: 12/10/2016 PCP: Wilfred Lacy, NP  Brief Narrative:  Tricia Hernandez is a 83 y.o. female with known past medical history of fibrillation on xarelto, pulmonary embolism, obesity, hypertension, depression, prior lung cancer, anal cancer status post colostomy, diastolic heart failure symptoms class II, presented with complaints of shortness of breath that has progressed over last 2 weeks, with severe SOB mostly with ambulation and activities of daily living  Assessment & Plan: 1. 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 Dyspnea and progressive SOB  -due to anemia, component of diastolic CHF -IV lasix today -FU ECHO -TSH ok  3.  h/o PE in 2015 -xarelto on hold   4. Atrial Fibrillation -chadsvasc score >3 -recently started on tikosyn by EP cardiologist and followed at Afib clinic.  - Currently in SR.  Holding Xarelto as above.   5. Hypertension  - stable, continue metoprolol   DVT Prophylaxis: SCDs Code Status: full   Family Communication: daughter at bedside  Disposition Plan: Home in 1-2days  Consultants:   [facility]   Procedures: EGD  Subjective: -some shortness of breath  Objective: Vitals:   12/11/16 1056 12/11/16 1107 12/11/16 1111 12/11/16 1237  BP: (!) 101/44  (!) 91/41 (!) 113/33  Pulse:    65  Resp:  (!) 22    Temp:   98 F (36.7 C)   TempSrc:      SpO2:      Weight:      Height:        Intake/Output Summary (Last 24 hours) at 12/11/16 1246 Last data filed at 12/11/16 1043  Gross per 24 hour  Intake              834 ml  Output               75 ml  Net              759 ml   Filed Weights   12/10/16 1606 12/11/16 0641 12/11/16 0915    Weight: 112 kg (247 lb) 115.8 kg (255 lb 6.4 oz) 115.7 kg (255 lb)    Examination:  General exam: Appears calm and comfortable  Respiratory system: few wheezes Cardiovascular system: S1 & S2 heard, RRR. No JVD, murmurs, rubs, gallops or clicks Gastrointestinal system: Abdomen is nondistended, soft and nontender. Normal bowel sounds heard. Central nervous system: Alert and oriented. No focal neurological deficits. Extremities: Symmetric 5 x 5 power. Skin: No rashes, lesions or ulcers Psychiatry: Judgement and insight appear normal. Mood & affect appropriate.     Data Reviewed:   CBC:  Recent Labs Lab 12/10/16 1005 12/11/16 0011  WBC 4.6 3.4*  HGB 8.0* 7.1*  HCT 27.1* 23.7*  MCV 91.6 91.2  PLT 147* 272*   Basic Metabolic Panel:  Recent Labs Lab 12/10/16 1005 12/10/16 1635 12/11/16 0011  NA 140  --  140  K 4.0  --  4.0  CL 105  --  105  CO2 30  --  29  GLUCOSE 96  --  115*  BUN 15  --  15  CREATININE 0.89  --  0.95  CALCIUM 8.6*  --  8.6*  MG  --  2.1  --    GFR: Estimated Creatinine Clearance: 56 mL/min (by C-G formula based on SCr of 0.95 mg/dL). Liver Function Tests:  Recent Labs Lab 12/11/16 0011  AST 20  ALT 12*  ALKPHOS 40  BILITOT 0.3  PROT 4.7*  ALBUMIN 2.2*   No results for input(s): LIPASE, AMYLASE in the last 168 hours. No results for input(s): AMMONIA in the last 168 hours. Coagulation Profile:  Recent Labs Lab 12/11/16 0011  INR 1.34   Cardiac Enzymes:  Recent Labs Lab 12/10/16 1818 12/11/16 0011  TROPONINI 0.07* 0.07*   BNP (last 3 results) No results for input(s): PROBNP in the last 8760 hours. HbA1C: No results for input(s): HGBA1C in the last 72 hours. CBG: No results for input(s): GLUCAP in the last 168 hours. Lipid Profile: No results for input(s): CHOL, HDL, LDLCALC, TRIG, CHOLHDL, LDLDIRECT in the last 72 hours. Thyroid Function Tests:  Recent Labs  12/11/16 0011  TSH 1.558   Anemia Panel:  Recent  Labs  12/10/16 1635 12/11/16 0011  VITAMINB12 1,174*  --   FOLATE 23.0 20.1  FERRITIN 32  --   RETICCTPCT 4.1*  --    Urine analysis:    Component Value Date/Time   COLORURINE YELLOW 08/27/2016 1541   APPEARANCEUR HAZY (A) 08/27/2016 1541   LABSPEC 1.025 09/23/2016 1531   PHURINE 6.0 09/23/2016 1531   PHURINE 5.0 08/27/2016 1541   GLUCOSEU Negative 09/23/2016 1531   HGBUR Trace 09/23/2016 1531   HGBUR LARGE (A) 08/27/2016 1541   BILIRUBINUR Negative 09/23/2016 1531   KETONESUR Negative 09/23/2016 1531   KETONESUR NEGATIVE 08/27/2016 1541   PROTEINUR < 30 09/23/2016 1531   PROTEINUR 30 (A) 08/27/2016 1541   UROBILINOGEN 0.2 09/23/2016 1531   NITRITE Negative 09/23/2016 1531   NITRITE NEGATIVE 08/27/2016 1541   LEUKOCYTESUR Moderate 09/23/2016 1531   Sepsis Labs: '@LABRCNTIP'$ (procalcitonin:4,lacticidven:4)  ) Recent Results (from the past 240 hour(s))  MRSA PCR Screening     Status: None   Collection Time: 12/10/16  8:53 PM  Result Value Ref Range Status   MRSA by PCR NEGATIVE NEGATIVE Final    Comment:        The GeneXpert MRSA Assay (FDA approved for NASAL specimens only), is one component of a comprehensive MRSA colonization surveillance program. It is not intended to diagnose MRSA infection nor to guide or monitor treatment for MRSA infections.          Radiology Studies: Dg Chest 2 View  Result Date: 12/10/2016 CLINICAL DATA:  Shortness of Breath EXAM: CHEST  2 VIEW COMPARISON:  09/16/2016, 08/26/16 FINDINGS: Cardiac shadow is stable. Scarring is again seen in the right lung consistent with the patient's known prior surgery. Increased pleural based soft tissue density is noted laterally in the right lung. No effusion is seen. No acute infiltrate is noted. No bony abnormality is seen. IMPRESSION: Postsurgical scarring in the right mid lung stable from previous exam. Slight increase in the degree of pleural based density. Nonemergent CT may be helpful for  further evaluation Electronically Signed   By: Inez Catalina M.D.   On: 12/10/2016 10:29        Scheduled Meds: . atorvastatin  20 mg Oral q1800  . budesonide  0.25 mg Nebulization BID  . calcium-vitamin D  1 tablet Oral Q breakfast  . dofetilide  500 mcg Oral BID  . feeding supplement (ENSURE ENLIVE)  237 mL Oral TID BM  . fenofibrate  160 mg Oral Daily  . fluticasone  1 spray Each Nare Daily  . furosemide  40 mg Intravenous Q12H  . ipratropium-albuterol  3 mL Nebulization BID  . magnesium oxide  400 mg Oral Daily  . metoprolol tartrate  25 mg Oral BID  . montelukast  10 mg Oral QHS  . pantoprazole  40 mg Oral BID  . PARoxetine  20 mg Oral QHS  . potassium chloride  20 mEq Oral BID  . sodium chloride flush  3 mL Intravenous Q12H   Continuous Infusions: . sodium chloride Stopped (12/11/16 1030)  . sodium chloride    . lactated ringers 10 mL/hr at 12/11/16 0923     LOS: 1 day    Time spent: 86mn    PDomenic Polite MD [facility] Pager [phone] If 7PM-7AM, please contact night-coverage [email or web address] Password [email or web address] 12/11/2016, 12:46 PM

## 2016-12-11 NOTE — Progress Notes (Signed)
Pt lung fields sounds improved,clear and diminished since coming on shift after lasix. Scheduled medications given. Pt states she feels better than she has felt. Will continue to monitor.

## 2016-12-11 NOTE — Transfer of Care (Signed)
Immediate Anesthesia Transfer of Care Note  Patient: Tricia Hernandez  Procedure(s) Performed: Procedure(s): ESOPHAGOGASTRODUODENOSCOPY (EGD) (N/A)  Patient Location: PACU  Anesthesia Type:MAC  Level of Consciousness: awake, alert , oriented and patient cooperative  Airway & Oxygen Therapy: Patient Spontanous Breathing and Patient connected to nasal cannula oxygen  Post-op Assessment: Report given to RN, Post -op Vital signs reviewed and stable and Patient moving all extremities  Post vital signs: Reviewed and stable  Last Vitals:  Vitals:   12/11/16 0915 12/11/16 1053  BP: (!) 110/28   Pulse:  64  Resp: 15 (!) 21  Temp: 36.8 C 36.5 C    Last Pain:  Vitals:   12/11/16 0915  TempSrc: Oral  PainSc:          Complications: No apparent anesthesia complications

## 2016-12-11 NOTE — Anesthesia Postprocedure Evaluation (Addendum)
Anesthesia Post Note  Patient: Tricia Hernandez  Procedure(s) Performed: Procedure(s) (LRB): ESOPHAGOGASTRODUODENOSCOPY (EGD) (N/A)  Patient location during evaluation: Endoscopy Anesthesia Type: MAC Level of consciousness: awake and alert Pain management: pain level controlled Vital Signs Assessment: post-procedure vital signs reviewed and stable Respiratory status: spontaneous breathing, nonlabored ventilation, respiratory function stable and patient connected to nasal cannula oxygen Cardiovascular status: stable and blood pressure returned to baseline Anesthetic complications: no       Last Vitals:  Vitals:   12/11/16 1237 12/11/16 1323  BP: (!) 113/33 (!) 111/39  Pulse: 65 67  Resp:  16  Temp:      Last Pain:  Vitals:   12/11/16 0915  TempSrc: Oral  PainSc:                  Tricia Hernandez

## 2016-12-11 NOTE — Anesthesia Preprocedure Evaluation (Addendum)
Anesthesia Evaluation  Patient identified by MRN, date of birth, ID band Patient awake    Reviewed: Allergy & Precautions, NPO status , Patient's Chart, lab work & pertinent test results  History of Anesthesia Complications Negative for: history of anesthetic complications  Airway Mallampati: II  TM Distance: >3 FB Neck ROM: Full    Dental  (+) Upper Dentures, Partial Lower   Pulmonary shortness of breath, COPD,  COPD inhaler, neg recent URI, former smoker,  h/o lung ca,   Shallow respirations.   breath sounds clear to auscultation       Cardiovascular hypertension, Pt. on medications +CHF   Rhythm:Irregular     Neuro/Psych PSYCHIATRIC DISORDERS Anxiety Depression negative neurological ROS     GI/Hepatic Neg liver ROS, hiatal hernia, PUD, GERD  Medicated and Controlled,  Endo/Other  negative endocrine ROS  Renal/GU negative Renal ROS     Musculoskeletal   Abdominal   Peds  Hematology  (+) anemia ,   Anesthesia Other Findings   Reproductive/Obstetrics                             Anesthesia Physical Anesthesia Plan  ASA: III  Anesthesia Plan: MAC   Post-op Pain Management:    Induction: Intravenous  Airway Management Planned: Nasal Cannula, Natural Airway and Simple Face Mask  Additional Equipment: None  Intra-op Plan:   Post-operative Plan:   Informed Consent: I have reviewed the patients History and Physical, chart, labs and discussed the procedure including the risks, benefits and alternatives for the proposed anesthesia with the patient or authorized representative who has indicated his/her understanding and acceptance.   Dental advisory given  Plan Discussed with: CRNA and Surgeon  Anesthesia Plan Comments:         Anesthesia Quick Evaluation

## 2016-12-11 NOTE — Progress Notes (Signed)
Paged provider regarding pt's low bp of 113/33 and lasix dose. Per provider Broadus John, it is ok to give lasix. Will continue to monitor pt.

## 2016-12-11 NOTE — Progress Notes (Signed)
Initial Nutrition Assessment  DOCUMENTATION CODES:   Morbid obesity  INTERVENTION:   Ensure Enlive po BID, each supplement provides 350 kcal and 20 grams of protein   NUTRITION DIAGNOSIS:   Increased nutrient needs related to catabolic illness, cancer, CHF as evidenced by increased estimated needs from protein.  GOAL:   Patient will meet greater than or equal to 90% of their needs  MONITOR:   PO intake, Supplement acceptance, Labs, Weight trends  REASON FOR ASSESSMENT:   Consult Assessment of nutrition requirement/status  ASSESSMENT:   83 y.o.femalewith known past medical history of fibrillation on xarelto, pulmonary embolism, obesity, hypertension, depression, prior lung cancer, anal cancer status post colostomy, diastolic heart failure symptoms class II, presented with complaints of shortness of breath that has progressed over last 2 weeks, with severe SOB mostly with ambulation and activities of daily living   Met with pt in room today. Pt reports good appetite and oral intake pta and currently eating 100% full liquid diet. Pt drinks Ensure at home and is ordered to have Ensure in hospital. Per chart pt with wt gain likely secondary to edema but stable weights pta. 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 RD will continue to monitor for nutritional needs.    Medications reviewed and include: Ca-Vit D, lasix, Mg Oxide, protonix, paxil, KCl  Labs reviewed: Ca 8.6(L) adj. 10.04 wnl, alb 2.2(L) Wbc- 3.5(L), Hgb 8.3(L), Hct 27(L)  Nutrition-Focused physical exam completed. Findings are no fat depletion, no muscle depletion, and mild edema in BLE.   Diet Order:  Diet full liquid Room service appropriate? Yes; Fluid consistency: Thin  Skin:  Reviewed, no issues  Last BM:  5/2-colostomy  Height:   Ht Readings from Last 1 Encounters:  12/11/16 5' 4"  (1.626 m)    Weight:    Wt Readings from Last 1 Encounters:  12/11/16 255 lb (115.7 kg)    Ideal Body Weight:  54.5 kg  BMI:  Body mass index is 43.77 kg/m.  Estimated Nutritional Needs:   Kcal:  1700-2000kcal/day   Protein:  93-127g/day   Fluid:  >1.7L/day   EDUCATION NEEDS:   Education needs no appropriate at this time  Koleen Distance, RD, [place] Pager #[phone]

## 2016-12-11 NOTE — Evaluation (Signed)
Physical Therapy Evaluation Patient Details Name: Tricia Hernandez MRN: [id number] DOB: 11-17-1930 Today's Date: 12/11/2016   History of Present Illness  Tricia Hernandez is a 83 y.o. female with known past medical history of fibrillation on xarelto, pulmonary embolism, obesity, hypertension, depression, prior lung cancer, anal cancer status post colostomy, diastolic heart failure symptoms class II, presents with complaints of shortness of breath that has progressed over last 2 weeks.  Clinical Impression  Pt admitted with/for progressive SOB.  Pt presently needs min to min guard to get from supine to stand, but needs rest breaks to attain stand..  Pt currently limited functionally due to the problems listed. ( See problems list.)   Pt will benefit from PT to maximize function and safety in order to get ready for next venue listed below.     Follow Up Recommendations SNF    Equipment Recommendations  None recommended by PT    Recommendations for Other Services       Precautions / Restrictions Precautions Precautions: Fall      Mobility  Bed Mobility Overal bed mobility: Needs Assistance Bed Mobility: Supine to Sit;Sit to Supine     Supine to sit: Min assist Sit to supine: Min guard   General bed mobility comments: pt had to rest at EOB for minutes before ready to stand.  pt could slide herself to EOB, but then was wiped out.  Transfers Overall transfer level: Needs assistance   Transfers: Sit to/from Stand Sit to Stand: Min guard         General transfer comment: no assist needed though pt used UE's to assist.  Pt side stepped to [facility].  Ambulation/Gait             General Gait Details: pt not able to collect herself and too dyspneic to ambulate today.  Stairs            Wheelchair Mobility    Modified Rankin (Stroke Patients Only)       Balance Overall balance assessment: Needs assistance   Sitting balance-Leahy Scale: Good     Standing balance  support: No upper extremity supported;Bilateral upper extremity supported Standing balance-Leahy Scale: Fair                               Pertinent Vitals/Pain Pain Assessment: No/denies pain    Home Living Family/patient expects to be discharged to:: Private residence Living Arrangements: Alone Available Help at Discharge: Family;Available PRN/intermittently Type of Home: House Home Access: Stairs to enter Entrance Stairs-Rails: Right;Left;Can reach both Entrance Stairs-Number of Steps: 4 Home Layout: One level Home Equipment: Walker - 2 wheels;Cane - single point;Shower seat;Walker - 4 wheels;Bedside commode      Prior Function Level of Independence: Independent         Comments: Independent with ambulation and ADL's.  Drives, but has not driven since before December.     Hand Dominance        Extremity/Trunk Assessment   Upper Extremity Assessment Upper Extremity Assessment: Overall WFL for tasks assessed    Lower Extremity Assessment Lower Extremity Assessment: Overall WFL for tasks assessed (mild general weakness likely due from general inactivity)       Communication   Communication: HOH  Cognition Arousal/Alertness: Awake/alert Behavior During Therapy: Anxious Overall Cognitive Status: Within Functional Limits for tasks assessed  General Comments General comments (skin integrity, edema, etc.): With minimal exertion, pt's sats were 92% on 2L and HR was 82 bpm     Exercises     Assessment/Plan    PT Assessment Patient needs continued PT services  PT Problem List Decreased strength;Decreased activity tolerance;Decreased mobility;Decreased balance;Cardiopulmonary status limiting activity       PT Treatment Interventions Gait training;Functional mobility training;DME instruction;Therapeutic activities;Balance training;Patient/family education    PT Goals (Current goals can be found  in the Care Plan section)  Acute Rehab PT Goals Patient Stated Goal: be able to live by myself PT Goal Formulation: With patient Time For Goal Achievement: 12/25/16 Potential to Achieve Goals: Good    Frequency Min 3X/week   Barriers to discharge Decreased caregiver support      Co-evaluation               AM-PAC PT "6 Clicks" Daily Activity  Outcome Measure Difficulty turning over in bed (including adjusting bedclothes, sheets and blankets)?: A Little Difficulty moving from lying on back to sitting on the side of the bed? : A Little Difficulty sitting down on and standing up from a chair with arms (e.g., wheelchair, bedside commode, etc,.)?: A Little Help needed moving to and from a bed to chair (including a wheelchair)?: A Little Help needed walking in hospital room?: A Little Help needed climbing 3-5 steps with a railing? : A Lot 6 Click Score: 17    End of Session Equipment Utilized During Treatment: Oxygen Activity Tolerance: Patient limited by fatigue;Other (comment) (limited by "inability to catch my breath.") Patient left: in bed;with call bell/phone within reach;with nursing/sitter in room Nurse Communication: Mobility status PT Visit Diagnosis: Unsteadiness on feet (R26.81);Other abnormalities of gait and mobility (R26.89);Muscle weakness (generalized) (M62.81)    Time: 3794-3276 PT Time Calculation (min) (ACUTE ONLY): 30 min   Charges:   PT Evaluation $PT Eval Moderate Complexity: 1 Procedure PT Treatments $Therapeutic Activity: 8-22 mins   PT G Codes:        01-03-2017  Donnella Sham, PT [phone]  (pager)  Tessie Fass Billi Bright 01-03-2017, 5:12 PM

## 2016-12-12 ENCOUNTER — Other Ambulatory Visit (HOSPITAL_COMMUNITY): Payer: Medicare Other

## 2016-12-12 ENCOUNTER — Encounter (HOSPITAL_COMMUNITY): Payer: Self-pay | Admitting: Physician Assistant

## 2016-12-12 LAB — CBC
HEMATOCRIT: 28.4 % — AB (ref 36.0–46.0)
HEMOGLOBIN: 8.4 g/dL — AB (ref 12.0–15.0)
MCH: 26.7 pg (ref 26.0–34.0)
MCHC: 29.6 g/dL — AB (ref 30.0–36.0)
MCV: 90.2 fL (ref 78.0–100.0)
Platelets: 141 10*3/uL — ABNORMAL LOW (ref 150–400)
RBC: 3.15 MIL/uL — AB (ref 3.87–5.11)
RDW: 19.3 % — ABNORMAL HIGH (ref 11.5–15.5)
WBC: 4.4 10*3/uL (ref 4.0–10.5)

## 2016-12-12 LAB — BPAM RBC
BLOOD PRODUCT EXPIRATION DATE: 201805242359
ISSUE DATE / TIME: 201805020930
UNIT TYPE AND RH: 5100

## 2016-12-12 LAB — BASIC METABOLIC PANEL
ANION GAP: 8 (ref 5–15)
BUN: 12 mg/dL (ref 6–20)
CHLORIDE: 102 mmol/L (ref 101–111)
CO2: 30 mmol/L (ref 22–32)
CREATININE: 0.92 mg/dL (ref 0.44–1.00)
Calcium: 8.5 mg/dL — ABNORMAL LOW (ref 8.9–10.3)
GFR, EST NON AFRICAN AMERICAN: 56 mL/min — AB (ref >60)
Glucose, Bld: 131 mg/dL — ABNORMAL HIGH (ref 65–99)
POTASSIUM: 4.3 mmol/L (ref 3.5–5.1)
SODIUM: 140 mmol/L (ref 135–145)

## 2016-12-12 LAB — TYPE AND SCREEN
ABO/RH(D): O POS
ANTIBODY SCREEN: NEGATIVE
UNIT DIVISION: 0

## 2016-12-12 MED ORDER — FUROSEMIDE 40 MG PO TABS
40.0000 mg | ORAL_TABLET | Freq: Every day | ORAL | Status: DC
  Administered 2016-12-12 – 2016-12-13 (×2): 40 mg via ORAL
  Filled 2016-12-12 (×3): qty 1

## 2016-12-12 NOTE — Clinical Social Work Note (Signed)
Clinical Social Work Assessment  Patient Details  Name: Tricia Hernandez MRN: [id number] Date of Birth: [birth year]  Date of referral:  12/12/16               Reason for consult:  Facility Placement, Discharge Planning                Permission sought to share information with:  Facility Sport and exercise psychologist, Family Supports Permission granted to share information::  Yes, Verbal Permission Granted  Name::     Teacher, English as a foreign language::  SNF  Relationship::  Daughter  Contact Information:     Housing/Transportation Living arrangements for the past 2 months:  [place] of Information:  Patient, Adult Children Patient Interpreter Needed:  None Criminal Activity/Legal Involvement Pertinent to Current Situation/Hospitalization:  No - Comment as needed Significant Relationships:  Adult Children Lives with:  Self Do you feel safe going back to the place where you live?  No Need for family participation in patient care:  No (Coment)  Care giving concerns:  Pt currently lives at home alone with support from adult children. Pt's son reports that he goes over and cooks meals for her. Pt's son discussed how the pt does not ambulate much on her own if she is not pushed; she will sit in her recliner for most of the day. Pt's daughter and son reported that the pt will require daily therapy in order to recover and build strength. Pt's daughter and son indicated that returning home would not give the pt the support that she needs post-DC.   Social Worker assessment / plan:  CSW explained SNF recommendation to pt and pt's son. CSW received feedback from pt and pt's son that the pt has been in previous SNF placements before and has also received home health prior to admission to the hospital. Pt discussed how she wanted to go back home because she had been in a lengthy SNF placement at [facility] for radiation earlier this year. Pt also expressed concern about how hard it is for her children to  care for her when she is away from home because they are responsible for doing her laundry. Pt and son discussed that if there will be a lot of medical appointments required post-DC, that they would prefer SNF closer to [facility] to more easily transport for doctors. Pt's son expressed concern about how the pt will not recover if she is sent home with home health because she will not choose to be active on her own. Pt's son desired a placement in SNF to help the pt build up strength before returning home. CSW contacted pt's daughter to ensure her desire to seek referral for SNF placement. Pt's daughter agreed with the pt's son's concerns and requested referral for SNF placement. Pt's daughter and son requested SNF referrals to [facility] in [place], [place].  Employment status:  Retired Forensic scientist:  Medicare PT Recommendations:  [place] / Referral to community resources:     Patient/Family's Response to care:  Pt requested a return home with home health, but pt's son and daughter are agreeable to SNF placement at this time. Pt was agreeable to SNF placement because both of her kids were agreeable.   Patient/Family's Understanding of and Emotional Response to Diagnosis, Current Treatment, and Prognosis:  Pt seemed agreeable to SNF placement, but expressed sadness at not being at home again. Pt discussed how much she appreciates the comforts of her own home  as opposed to being in a hospital or nursing facility. Pt also expressed concern for her children having extra responsibilities, such as doing her laundry and transporting it for her, when she is in a nursing facility. However, pt's daughter and son are worried about how the pt will recover if left on her own. Pt's daughter and son are hopeful that the pt will recover strength from a rehab stay and more successfully transition back home after.  Emotional Assessment Appearance:  Appears stated  age Attitude/Demeanor/Rapport:    Affect (typically observed):  Appropriate Orientation:  Oriented to Self, Oriented to Place, Oriented to  Time, Oriented to Situation Alcohol / Substance use:  Not Applicable Psych involvement (Current and /or in the community):  No (Comment)  Discharge Needs  Concerns to be addressed:  Care Coordination, Discharge Planning Concerns Readmission within the last 30 days:  No Current discharge risk:  Physical Impairment Barriers to Discharge:  Continued Medical Work up   [employer], [place] 12/12/2016, 11:04 AM

## 2016-12-12 NOTE — Progress Notes (Signed)
PROGRESS NOTE    Tricia Hernandez  AOZ:[id number] DOB: 03-27-1931 DOA: 12/10/2016 PCP: Wilfred Lacy, NP  Brief Narrative:  Tricia Hernandez is a 83 y.o. female with known past medical history of fibrillation on xarelto, pulmonary embolism, obesity, hypertension, depression, prior lung cancer, anal cancer status post colostomy, diastolic heart failure symptoms class II, presented with complaints of shortness of breath that has progressed over last 2 weeks, with severe SOB mostly with ambulation and activities of daily living  Assessment & Plan: 1. Upper GI Bleed -admitted with anemia/GI bleed -EGD today with mild gastritis, esophagitis and three angiodysplastic lesions in the duodenum ablated with APC -Xarelto on hold, resume tomorrow, needs this due to H/o PE and AFib -hb down to 7.1 yesterday, transfused 1 unit PRBC, Hb now 8.3 and stable -continue PPI  2. Dyspnea and progressive SOB  -due to anemia, component of diastolic CHF -much improved after blood and lasix, change lasix to PO -FU ECHO -TSH ok  3.  h/o PE in 2015 -xarelto on hold, resume tomorrow  4. Atrial Fibrillation -chadsvasc score >3 -recently started on tikosyn by EP cardiologist and followed at Afib clinic.  - Currently in SR.  Holding Xarelto as above.   5. Hypertension  - stable, continue metoprolol   DVT Prophylaxis: SCDs Code Status: full   Family Communication: son at bedside  Disposition Plan: SNF in 1-2days  Consultants:   [facility]   Procedures: EGD  Subjective: - shortness of breath-much iomproved  Objective: Vitals:   12/12/16 0516 12/12/16 0948 12/12/16 0954 12/12/16 1058  BP: (!) 104/39     Pulse: 61     Resp: 18     Temp: 98.3 F (36.8 C)     TempSrc:      SpO2: 98% 97% 97% 95%  Weight:      Height:        Intake/Output Summary (Last 24 hours) at 12/12/16 1304 Last data filed at 12/12/16 0946  Gross per 24 hour  Intake              240 ml  Output                3 ml  Net               237 ml   Filed Weights   12/10/16 1606 12/11/16 0641 12/11/16 0915  Weight: 112 kg (247 lb) 115.8 kg (255 lb 6.4 oz) 115.7 kg (255 lb)    Examination:  General exam: Appears calm and comfortable , no distress,  Respiratory system: clear today Cardiovascular system: S1 & S2 heard, RRR Gastrointestinal system: Abdomen is  soft and nontender. Normal bowel sounds heard. Central nervous system: Alert and oriented. No focal neurological deficits. Extremities: Symmetric 5 x 5 power. Skin: No rashes, lesions or ulcers Psychiatry: Judgement and insight appear normal. Mood & affect appropriate.     Data Reviewed:   CBC:  Recent Labs Lab 12/10/16 1005 12/11/16 0011 12/11/16 1222 12/12/16 0949  WBC 4.6 3.4* 3.5* 4.4  HGB 8.0* 7.1* 8.3* 8.4*  HCT 27.1* 23.7* 27.0* 28.4*  MCV 91.6 91.2 90.0 90.2  PLT 147* 122* 101* 962*   Basic Metabolic Panel:  Recent Labs Lab 12/10/16 1005 12/10/16 1635 12/11/16 0011 12/12/16 0949  NA 140  --  140 140  K 4.0  --  4.0 4.3  CL 105  --  105 102  CO2 30  --  29 30  GLUCOSE 96  --  115*  131*  BUN 15  --  15 12  CREATININE 0.89  --  0.95 0.92  CALCIUM 8.6*  --  8.6* 8.5*  MG  --  2.1  --   --    GFR: Estimated Creatinine Clearance: 57.9 mL/min (by C-G formula based on SCr of 0.92 mg/dL). Liver Function Tests:  Recent Labs Lab 12/11/16 0011  AST 20  ALT 12*  ALKPHOS 40  BILITOT 0.3  PROT 4.7*  ALBUMIN 2.2*   No results for input(s): LIPASE, AMYLASE in the last 168 hours. No results for input(s): AMMONIA in the last 168 hours. Coagulation Profile:  Recent Labs Lab 12/11/16 0011  INR 1.34   Cardiac Enzymes:  Recent Labs Lab 12/10/16 1818 12/11/16 0011  TROPONINI 0.07* 0.07*   BNP (last 3 results) No results for input(s): PROBNP in the last 8760 hours. HbA1C: No results for input(s): HGBA1C in the last 72 hours. CBG: No results for input(s): GLUCAP in the last 168 hours. Lipid Profile: No results for input(s):  CHOL, HDL, LDLCALC, TRIG, CHOLHDL, LDLDIRECT in the last 72 hours. Thyroid Function Tests:  Recent Labs  12/11/16 0011  TSH 1.558   Anemia Panel:  Recent Labs  12/10/16 1635 12/11/16 0011  VITAMINB12 1,174*  --   FOLATE 23.0 20.1  FERRITIN 32  --   RETICCTPCT 4.1*  --    Urine analysis:    Component Value Date/Time   COLORURINE COLORLESS (A) 12/11/2016 1750   APPEARANCEUR CLEAR 12/11/2016 1750   LABSPEC 1.004 (L) 12/11/2016 1750   LABSPEC 1.025 09/23/2016 1531   PHURINE 6.0 12/11/2016 1750   GLUCOSEU NEGATIVE 12/11/2016 1750   GLUCOSEU Negative 09/23/2016 1531   HGBUR NEGATIVE 12/11/2016 1750   BILIRUBINUR NEGATIVE 12/11/2016 1750   BILIRUBINUR Negative 09/23/2016 1531   KETONESUR NEGATIVE 12/11/2016 1750   PROTEINUR NEGATIVE 12/11/2016 1750   UROBILINOGEN 0.2 09/23/2016 1531   NITRITE NEGATIVE 12/11/2016 1750   LEUKOCYTESUR LARGE (A) 12/11/2016 1750   LEUKOCYTESUR Moderate 09/23/2016 1531   Sepsis Labs: '@LABRCNTIP'$ (procalcitonin:4,lacticidven:4)  ) Recent Results (from the past 240 hour(s))  MRSA PCR Screening     Status: None   Collection Time: 12/10/16  8:53 PM  Result Value Ref Range Status   MRSA by PCR NEGATIVE NEGATIVE Final    Comment:        The GeneXpert MRSA Assay (FDA approved for NASAL specimens only), is one component of a comprehensive MRSA colonization surveillance program. It is not intended to diagnose MRSA infection nor to guide or monitor treatment for MRSA infections.          Radiology Studies: No results found.      Scheduled Meds: . atorvastatin  20 mg Oral q1800  . budesonide  0.25 mg Nebulization BID  . calcium-vitamin D  1 tablet Oral Q breakfast  . dofetilide  500 mcg Oral BID  . feeding supplement (ENSURE ENLIVE)  237 mL Oral TID BM  . fenofibrate  160 mg Oral Daily  . fluticasone  1 spray Each Nare Daily  . furosemide  40 mg Oral Daily  . ipratropium-albuterol  3 mL Nebulization BID  . magnesium oxide  400  mg Oral Daily  . metoprolol tartrate  25 mg Oral BID  . montelukast  10 mg Oral QHS  . pantoprazole  40 mg Oral BID  . PARoxetine  20 mg Oral QHS  . potassium chloride  20 mEq Oral BID  . sodium chloride flush  3 mL Intravenous Q12H   Continuous  Infusions: . sodium chloride Stopped (12/11/16 1030)     LOS: 2 days    Time spent: 79mn    PDomenic Polite MD [facility] Pager [phone] If 7PM-7AM, please contact night-coverage [email or web address] Password [email or web address] 12/12/2016, 1:04 PM

## 2016-12-12 NOTE — NC FL2 (Signed)
[place] LEVEL OF CARE SCREENING TOOL     IDENTIFICATION  Patient Name: Tricia Hernandez Birthdate: 1931-04-28 Sex: female Admission Date (Current Location): 12/10/2016  [place] and [place] Number:   Angelina Sheriff, [place])   Facility and Address:  [facility] [place] [place] [address] [place], [place] [address]      Provider Number: [id number]  Attending Physician Name and Address:  Domenic Polite, MD  Relative Name and Phone Number:       Current Level of Care: Hospital Recommended Level of Care: [place] Prior Approval Number:    Date Approved/Denied:   PASRR Number:    Discharge Plan: SNF    Current Diagnoses: Patient Active Problem List   Diagnosis Date Noted  . Angiodysplasia of duodenum   . GI bleeding 12/10/2016  . Heme positive stool 12/10/2016  . Mixed hyperlipidemia 11/05/2016  . Chronic bronchitis ([place]) 11/04/2016  . Persistent atrial fibrillation ([place]) 10/07/2016  . HCAP (healthcare-associated pneumonia) 08/26/2016  . Acute respiratory failure with hypoxia ([place]) 08/26/2016  . Essential hypertension   . Lung nodule   . Palliative care encounter   . Dyspnea 07/23/2016  . SOB (shortness of breath) 07/22/2016  . Symptomatic anemia 07/22/2016  . Anal cancer ([place]) 07/22/2016  . BRBPR (bright red blood per rectum) 07/22/2016  . Elevated troponin 07/22/2016  . Atrial fibrillation ([place]), CHA2DS2-VASc Score 5 07/22/2016  . Pulmonary embolus ([place])   . Obesity   . Lung cancer ([place])   . Hypertension     Orientation RESPIRATION BLADDER Height & Weight     Self, Time, Situation, Place  O2 ([place] 1L) Incontinent Weight: 255 lb (115.7 kg) Height:  '5\' 4"'$  (162.6 cm)  BEHAVIORAL SYMPTOMS/MOOD NEUROLOGICAL BOWEL NUTRITION STATUS      Colostomy Diet (cardiac)  AMBULATORY STATUS COMMUNICATION OF NEEDS Skin   Extensive Assist Verbally Normal                       Personal Care Assistance Level of Assistance  Bathing,  Dressing Bathing Assistance: Maximum assistance   Dressing Assistance: Maximum assistance     Functional Limitations Info  Hearing   Hearing Info: Impaired (wears hearing aids)      [place]  PT (By licensed PT), OT (By licensed OT)     PT Frequency: 5x/wk OT Frequency: 5x/wk            Contractures      Additional Factors Info  Code Status, Allergies, Psychotropic Code Status Info: Full Allergies Info: Penicillins, Aleve Naproxen Sodium, Antihistamines, Chlorpheniramine-type, Aspirin, Benadryl Diphenhydramine Hcl, Codeine, Hydrochlorothiazide, Rocephin Ceftriaxone Sodium In Dextrose, Sulfa Antibiotics Psychotropic Info: Paroxetine '20mg'$          Current Medications (12/12/2016):  This is the current hospital active medication list Current Facility-Administered Medications  Medication Dose Route Frequency Provider Last Rate Last Dose  . 0.9 %  sodium chloride infusion  250 mL Intravenous PRN Clanford Marisa Hua, MD   Stopped at 12/11/16 1030  . acetaminophen (TYLENOL) tablet 650 mg  650 mg Oral Q6H PRN Clanford L Johnson, MD      . atorvastatin (LIPITOR) tablet 20 mg  20 mg Oral q1800 Clanford Marisa Hua, MD   20 mg at 12/11/16 1749  . budesonide (PULMICORT) nebulizer solution 0.25 mg  0.25 mg Nebulization BID Clanford Marisa Hua, MD   0.25 mg at 12/12/16 0954  . calcium-vitamin D (OSCAL WITH D) 500-200 MG-UNIT per tablet 1 tablet  1 tablet Oral Q breakfast Clanford Marisa Hua, MD   1 tablet at 12/12/16 [id number]  . dofetilide (TIKOSYN) capsule 500 mcg  500 mcg Oral BID Clanford Marisa Hua, MD   500 mcg at 12/12/16 0918  . feeding supplement (ENSURE ENLIVE) (ENSURE ENLIVE) liquid 237 mL  237 mL Oral TID BM Clanford L Johnson, MD   237 mL at 12/12/16 1044  . fenofibrate tablet 160 mg  160 mg Oral Daily Clanford Marisa Hua, MD   160 mg at 12/12/16 1043  . fluticasone (FLONASE) 50 MCG/ACT nasal spray 1 spray  1 spray Each Nare Daily Clanford Marisa Hua, MD   1 spray at  12/12/16 1041  . furosemide (LASIX) tablet 40 mg  40 mg Oral Daily Domenic Polite, MD   40 mg at 12/12/16 1047  . ipratropium-albuterol (DUONEB) 0.5-2.5 (3) MG/3ML nebulizer solution 3 mL  3 mL Nebulization BID Clanford Marisa Hua, MD   3 mL at 12/12/16 0948  . magnesium oxide (MAG-OX) tablet 400 mg  400 mg Oral Daily Clanford Marisa Hua, MD   400 mg at 12/12/16 1043  . metoprolol tartrate (LOPRESSOR) tablet 25 mg  25 mg Oral BID Clanford Marisa Hua, MD   25 mg at 12/12/16 1043  . montelukast (SINGULAIR) tablet 10 mg  10 mg Oral QHS Clanford Marisa Hua, MD   10 mg at 12/11/16 2102  . ondansetron (ZOFRAN) tablet 4 mg  4 mg Oral Q6H PRN Clanford Marisa Hua, MD       Or  . ondansetron (ZOFRAN) injection 4 mg  4 mg Intravenous Q6H PRN Clanford L Johnson, MD      . pantoprazole (PROTONIX) EC tablet 40 mg  40 mg Oral BID Clanford Marisa Hua, MD   40 mg at 12/12/16 1043  . PARoxetine (PAXIL) tablet 20 mg  20 mg Oral QHS Clanford Marisa Hua, MD   20 mg at 12/11/16 2102  . potassium chloride SA (K-DUR,KLOR-CON) CR tablet 20 mEq  20 mEq Oral BID Domenic Polite, MD   20 mEq at 12/12/16 1043  . senna-docusate (Senokot-S) tablet 1 tablet  1 tablet Oral QHS PRN Clanford L Johnson, MD      . sodium chloride flush (NS) 0.9 % injection 3 mL  3 mL Intravenous Q12H Clanford L Johnson, MD   3 mL at 12/12/16 1000  . sodium chloride flush (NS) 0.9 % injection 3 mL  3 mL Intravenous PRN Clanford L Johnson, MD      . traZODone (DESYREL) tablet 50 mg  50 mg Oral QHS PRN Clanford Marisa Hua, MD         Discharge Medications: Please see discharge summary for a list of discharge medications.  Relevant Imaging Results:  Relevant Lab Results:   Additional Information SS#: [id number]  Geralynn Ochs, LCSW

## 2016-12-12 NOTE — Progress Notes (Signed)
Physical Therapy Treatment Patient Details Name: Tricia Hernandez MRN: [id number] DOB: 11-15-1930 Today's Date: 12/12/2016    History of Present Illness Tricia Hernandez is a 83 y.o. female with known past medical history of fibrillation on xarelto, pulmonary embolism, obesity, hypertension, depression, prior lung cancer, anal cancer status post colostomy, diastolic heart failure symptoms class II, presents with complaints of shortness of breath that has progressed over last 2 weeks.    PT Comments    Pt progressing towards goals and increased ambulation tolerance. Pt continues to be limited by fatigue and is requiring from min to min guard assist for mobility tasks. Pt's diastolic BP low, however, discussed with MD, and MD cleared for ambulation. Current plan remains appropriate to increase independence with mobility. Will continue to follow.    Follow Up Recommendations  SNF     Equipment Recommendations  None recommended by PT    Recommendations for Other Services       Precautions / Restrictions Precautions Precautions: Fall Restrictions Weight Bearing Restrictions: No    Mobility  Bed Mobility Overal bed mobility: Needs Assistance Bed Mobility: Supine to Sit     Supine to sit: Min assist;HOB elevated     General bed mobility comments: Pt required use of bed rails and min A for trunk elevation. Verbal cues for sequencing.   Transfers Overall transfer level: Needs assistance Equipment used: Rolling walker (2 wheeled) Transfers: Sit to/from Stand Sit to Stand: Min guard         General transfer comment: Min guard for steadying once standing. Verbal cues for hand placement. Pt requiring increased time to perform transfer.   Ambulation/Gait Ambulation/Gait assistance: Min guard Ambulation Distance (Feet): 25 Feet Assistive device: 4-wheeled walker Gait Pattern/deviations: Step-through pattern;Decreased stride length;Trunk flexed Gait velocity: Decreased Gait velocity  interpretation: Below normal speed for age/gender General Gait Details: Slow, guarded gait. Pt distance limited by fatigue. Verbal cues for upright posture and proximity to device. Min guard for safety.     Stairs            Wheelchair Mobility    Modified Rankin (Stroke Patients Only)       Balance Overall balance assessment: Needs assistance Sitting-balance support: No upper extremity supported;Feet supported Sitting balance-Leahy Scale: Good     Standing balance support: Bilateral upper extremity supported;During functional activity Standing balance-Leahy Scale: Poor Standing balance comment: Reliant on Rollator for standing balance.                             Cognition Arousal/Alertness: Awake/alert Behavior During Therapy: WFL for tasks assessed/performed Overall Cognitive Status: Within Functional Limits for tasks assessed                                        Exercises      General Comments General comments (skin integrity, edema, etc.): Pt reporting she feels much better. BP checked secondary to low diastolic pressure; see vitals flowsheet for BP in lying, sitting, and standing. Discussed with MD prior to mobility and MD cleared for ambulation. Pt asymptomatic throughout gait/mobility training.       Pertinent Vitals/Pain Pain Assessment: No/denies pain    Home Living                      Prior Function  PT Goals (current goals can now be found in the care plan section) Acute Rehab PT Goals Patient Stated Goal: be able to live by myself PT Goal Formulation: With patient Time For Goal Achievement: 12/25/16 Potential to Achieve Goals: Good Progress towards PT goals: Progressing toward goals    Frequency    Min 3X/week      PT Plan Current plan remains appropriate    Co-evaluation              AM-PAC PT "6 Clicks" Daily Activity  Outcome Measure  Difficulty turning over in bed  (including adjusting bedclothes, sheets and blankets)?: Total Difficulty moving from lying on back to sitting on the side of the bed? : Total Difficulty sitting down on and standing up from a chair with arms (e.g., wheelchair, bedside commode, etc,.)?: Total Help needed moving to and from a bed to chair (including a wheelchair)?: A Little Help needed walking in hospital room?: A Little Help needed climbing 3-5 steps with a railing? : A Lot 6 Click Score: 11    End of Session Equipment Utilized During Treatment: Oxygen Activity Tolerance: Patient limited by fatigue Patient left: in chair;with chair alarm set;with call bell/phone within reach Nurse Communication: Mobility status PT Visit Diagnosis: Unsteadiness on feet (R26.81);Other abnormalities of gait and mobility (R26.89);Muscle weakness (generalized) (M62.81)     Time: 7841-2820 PT Time Calculation (min) (ACUTE ONLY): 30 min  Charges:  $Gait Training: 23-37 mins                    G Codes:       Nicky Pugh, PT, DPT  Acute Rehabilitation Services  Pager: [phone] 12/12/2016, 5:13 PM

## 2016-12-12 NOTE — Progress Notes (Signed)
Daily Rounding Note  12/12/2016, 9:20 AM  LOS: 2 days   SUBJECTIVE:   Chief complaint: previous SOB is resolved.  No pain or complaints.  Feels a lot better. Says she had only had mucous, clear jelly like output from rectum in many weeks, previous minor BPR post surgery long resolved     OBJECTIVE:         Vital signs in last 24 hours:    Temp:  [97.7 F (36.5 C)-98.3 F (36.8 C)] 98.3 F (36.8 C) (05/03 0516) Pulse Rate:  [61-114] 61 (05/03 0516) Resp:  [16-22] 18 (05/03 0516) BP: (91-128)/(33-56) 104/39 (05/03 0516) SpO2:  [96 %-99 %] 98 % (05/03 0516) Last BM Date: 12/11/16 Filed Weights   12/10/16 1606 12/11/16 0641 12/11/16 0915  Weight: 112 kg (247 lb) 115.8 kg (255 lb 6.4 oz) 115.7 kg (255 lb)   General: pleasant, in good spirits, aged but not ill looking   Heart: RRR Chest: clear bil.  No cough or dyspnea Abdomen: soft, NT, ND, active BS.  Deep brown soft stool in ostomy.    Extremities: 1 + pedal, ankle edema.   Neuro/Psych:  Oriented x 3.  In good spirits.  Moves all 4 limbs, no tremor.    Intake/Output from previous day: 05/02 0701 - 05/03 0700 In: 672 [I.V.:550; Blood:284] Out: 28 [Urine:3; Stool:25]  Intake/Output this shift: No intake/output data recorded.  Lab Results:  Recent Labs  12/10/16 1005 12/11/16 0011 12/11/16 1222  WBC 4.6 3.4* 3.5*  HGB 8.0* 7.1* 8.3*  HCT 27.1* 23.7* 27.0*  PLT 147* 122* 101*   BMET  Recent Labs  12/10/16 1005 12/11/16 0011  NA 140 140  K 4.0 4.0  CL 105 105  CO2 30 29  GLUCOSE 96 115*  BUN 15 15  CREATININE 0.89 0.95  CALCIUM 8.6* 8.6*   LFT  Recent Labs  12/11/16 0011  PROT 4.7*  ALBUMIN 2.2*  AST 20  ALT 12*  ALKPHOS 40  BILITOT 0.3   PT/INR  Recent Labs  12/11/16 0011  LABPROT 16.7*  INR 1.34   Hepatitis Panel No results for input(s): HEPBSAG, HCVAB, HEPAIGM, HEPBIGM in the last 72 hours.  Studies/Results: Dg Chest 2  View  Result Date: 12/10/2016 CLINICAL DATA:  Shortness of Breath EXAM: CHEST  2 VIEW COMPARISON:  09/16/2016, 08/26/16 FINDINGS: Cardiac shadow is stable. Scarring is again seen in the right lung consistent with the patient's known prior surgery. Increased pleural based soft tissue density is noted laterally in the right lung. No effusion is seen. No acute infiltrate is noted. No bony abnormality is seen. IMPRESSION: Postsurgical scarring in the right mid lung stable from previous exam. Slight increase in the degree of pleural based density. Nonemergent CT may be helpful for further evaluation Electronically Signed   By: Inez Catalina M.D.   On: 12/10/2016 10:29   Scheduled Meds: . atorvastatin  20 mg Oral q1800  . budesonide  0.25 mg Nebulization BID  . calcium-vitamin D  1 tablet Oral Q breakfast  . dofetilide  500 mcg Oral BID  . feeding supplement (ENSURE ENLIVE)  237 mL Oral TID BM  . fenofibrate  160 mg Oral Daily  . fluticasone  1 spray Each Nare Daily  . ipratropium-albuterol  3 mL Nebulization BID  . magnesium oxide  400 mg Oral Daily  . metoprolol tartrate  25 mg Oral BID  . montelukast  10 mg Oral QHS  .  pantoprazole  40 mg Oral BID  . PARoxetine  20 mg Oral QHS  . potassium chloride  20 mEq Oral BID  . sodium chloride flush  3 mL Intravenous Q12H   Continuous Infusions: . sodium chloride Stopped (12/11/16 1030)  . sodium chloride    . lactated ringers 10 mL/hr at 12/11/16 0923   PRN Meds:.sodium chloride, acetaminophen, ondansetron **OR** ondansetron (ZOFRAN) IV, senna-docusate, sodium chloride flush, traZODone  ASSESMENT:   *  FOBT + anemia.  No gross melena or hematemesis.  In past months post op, had minor BPR 5/2 EGD: grade A reflux esophagitis.  Mild, non-hemorrhagic gastritis.  Ablation of 3 Duodenal AVMs.  Now on BID Protonix   *  Acute on chronic blood loss anemia.  On po iron at home.  s/p PRBC x 1.    *  eliquis for hx PE 2015.   *  Hx Stage IV, T3 N3 MX  (possible lung metastases) anal cancer, s/p resection and sigmoidostomy 07/2016, completed radiation therapy as well.  Colonoscopy was in 2015, no anal cancer detected.  Hx lung cancer, partial lobectomy 2013.    *  Non-critical thrombocytopenia.    PLAN   *  Consider future capsule endoscopy, +/- colonoscopy if further bleeding or ongoing/progressive anemia.    Azucena Freed  12/12/2016, 9:20 AM Pager: [phone]  I have taken an interval history, reviewed the chart, and examined the patient. I agree with the Advanced Practitioner's note and impression. Monitor Hgb closely  No overt bleeding If declining Hgb or overt bleeding will need VCE and probably colonoscopy

## 2016-12-13 ENCOUNTER — Inpatient Hospital Stay (HOSPITAL_COMMUNITY): Payer: Medicare Other

## 2016-12-13 DIAGNOSIS — I360 Nonrheumatic tricuspid (valve) stenosis: Secondary | ICD-10-CM

## 2016-12-13 LAB — ECHOCARDIOGRAM COMPLETE
Height: 64 in
Weight: 3975.33 oz

## 2016-12-13 LAB — CBC
HEMATOCRIT: 27.1 % — AB (ref 36.0–46.0)
HEMOGLOBIN: 8.1 g/dL — AB (ref 12.0–15.0)
MCH: 26.7 pg (ref 26.0–34.0)
MCHC: 29.9 g/dL — AB (ref 30.0–36.0)
MCV: 89.4 fL (ref 78.0–100.0)
Platelets: 153 10*3/uL (ref 150–400)
RBC: 3.03 MIL/uL — ABNORMAL LOW (ref 3.87–5.11)
RDW: 19 % — ABNORMAL HIGH (ref 11.5–15.5)
WBC: 4.7 10*3/uL (ref 4.0–10.5)

## 2016-12-13 LAB — BASIC METABOLIC PANEL
ANION GAP: 8 (ref 5–15)
BUN: 13 mg/dL (ref 6–20)
CALCIUM: 8.6 mg/dL — AB (ref 8.9–10.3)
CHLORIDE: 96 mmol/L — AB (ref 101–111)
CO2: 36 mmol/L — ABNORMAL HIGH (ref 22–32)
Creatinine, Ser: 1.08 mg/dL — ABNORMAL HIGH (ref 0.44–1.00)
GFR calc Af Amer: 53 mL/min — ABNORMAL LOW (ref >60)
GFR calc non Af Amer: 46 mL/min — ABNORMAL LOW (ref >60)
GLUCOSE: 112 mg/dL — AB (ref 65–99)
Potassium: 4.4 mmol/L (ref 3.5–5.1)
Sodium: 140 mmol/L (ref 135–145)

## 2016-12-13 MED ORDER — APIXABAN 5 MG PO TABS
5.0000 mg | ORAL_TABLET | Freq: Two times a day (BID) | ORAL | Status: DC
  Administered 2016-12-13 – 2016-12-15 (×5): 5 mg via ORAL
  Filled 2016-12-13 (×5): qty 1

## 2016-12-13 MED ORDER — FERROUS SULFATE 325 (65 FE) MG PO TABS
325.0000 mg | ORAL_TABLET | Freq: Two times a day (BID) | ORAL | Status: DC
  Administered 2016-12-13 – 2016-12-15 (×5): 325 mg via ORAL
  Filled 2016-12-13 (×5): qty 1

## 2016-12-13 MED ORDER — FERROUS SULFATE 325 (65 FE) MG PO TABS: 325.0000 mg | ORAL_TABLET | Freq: Every day | ORAL | Status: DC

## 2016-12-13 MED ORDER — PANTOPRAZOLE SODIUM 40 MG PO TBEC
40.0000 mg | DELAYED_RELEASE_TABLET | Freq: Every day | ORAL | Status: DC
  Administered 2016-12-13 – 2016-12-15 (×3): 40 mg via ORAL
  Filled 2016-12-13 (×3): qty 1

## 2016-12-13 NOTE — Progress Notes (Signed)
[place] for Apixaban  Indication: atrial fibrillation and pulmonary embolus  Allergies  Allergen Reactions  . Penicillins Anaphylaxis    Reaction:  Unknown  Has patient had a PCN reaction causing immediate rash, facial/tongue/throat swelling, SOB or lightheadedness with hypotension: Unsure Has patient had a PCN reaction causing severe rash involving mucus membranes or skin necrosis: Unsure Has patient had a PCN reaction that required hospitalization Unsure Has patient had a PCN reaction occurring within the last 10 years: Unsure If all of the above answers are "NO", then may proceed with Cephalosporin use.   [name] [Naproxen Sodium] Hives  . Antihistamines, Chlorpheniramine-Type Rash    Reaction:  Unknown   . Aspirin Rash  . Benadryl [Diphenhydramine Hcl] Palpitations    Increases heart rate  . Codeine Rash  . Hydrochlorothiazide Rash  . Rocephin [Ceftriaxone Sodium In Dextrose] Rash  . Sulfa Antibiotics Rash    Patient Measurements: Height: '5\' 4"'$  (162.6 cm) Weight: 248 lb 7.3 oz (112.7 kg) IBW/kg (Calculated) : 54.7  Vital Signs: Temp: 98.2 F (36.8 C) (05/04 0554) BP: 109/34 (05/04 0554) Pulse Rate: 70 (05/04 0554)  Labs:  Recent Labs  12/10/16 1818  12/11/16 0011 12/11/16 1222 12/12/16 0949 12/13/16 0418  HGB  --   < > 7.1* 8.3* 8.4* 8.1*  HCT  --   < > 23.7* 27.0* 28.4* 27.1*  PLT  --   < > 122* 101* 141* 153  APTT  --   --  29  --   --   --   LABPROT  --   --  16.7*  --   --   --   INR  --   --  1.34  --   --   --   CREATININE  --   --  0.95  --  0.92 1.08*  TROPONINI 0.07*  --  0.07*  --   --   --   < > = values in this interval not displayed.  Estimated Creatinine Clearance: 48.5 mL/min (A) (by C-G formula based on SCr of 1.08 mg/dL (H)).   Medical History: Past Medical History:  Diagnosis Date  . 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 Anemia   . Anxiety   .  Atrial fibrillation ([place])   . Basal cell carcinoma of skin of nose   . CHF (congestive heart failure) ([place]) 11/2015  . Chronic bronchitis ([place])   . Chronic depression   . Colostomy in place [facility]) 07/2016  . Diverticular disease   . Fibrocystic disease of breast   . Fracture of shoulder   . GERD (gastroesophageal reflux disease)   . GI bleed 12/10/2016   in setting of no PPI. 5/2 EGD: grade A reflux esophagitis.  Mild, non-hemorrhagic gastritis.  Ablation of 3 Duodenal AVMs  . Herpes zoster   . History of blood transfusion    for vaginal, uterine bleeding leading to hysterectomy.  in 12/2016 transfused x 1 for GI bleed.   Marland Kitchen History of hiatal hernia   . Hyperlipidemia   . Hypertension   . Labyrinthitis   . Lung cancer ([place])    RML  . Obesity   . Paroxysmal supraventricular tachycardia ([place])   . Peptic ulcer of stomach    hx  . Pneumonia 1980s X 1; 08/2016   "walking; double"  . Pulmonary embolism ([place]) 05/2014   "left lung"  . Vitamin D deficiency  Assessment: 83 yo female on apixaban prior to arrival for history of atrial fibrillation and pulmonary embolus that was admitted with concern for upper GI bleed.  Apixaban on hold since admission. 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  Hgb 8.1, PLTc 153 with no more reported bleeding.  Apixaban to resume today.    Goal of Therapy:  Monitor platelets by anticoagulation protocol: Yes   Plan:  1. Restart Apixaban 5 mg BID 2. Follow up on CBC and any sxs of bleeding  Vincenza Hews, PharmD, BCPS 12/13/2016, 12:32 PM

## 2016-12-13 NOTE — Progress Notes (Signed)
PROGRESS NOTE    Tricia Hernandez  YSA:[id number] DOB: 04-17-1931 DOA: 12/10/2016 PCP: Wilfred Lacy, NP  Brief Narrative:  Tricia Hernandez is a 83 y.o. female with known past medical history of fibrillation on Eliquis, pulmonary embolism, obesity, hypertension, depression, prior lung cancer, anal cancer status post colostomy, diastolic heart failure symptoms class II, presented with complaints of shortness of breath that has progressed over last 2 weeks, with severe SOB mostly with ambulation and activities of daily living  Assessment & Plan: 1. 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 Acute on chronic diastolic CHF  -due to anemia, component of diastolic CHF -much improved after blood and lasix, change lasix to PO -TSH ok  3.  h/o PE in 2015 -resume eliquis  4. Atrial Fibrillation -chadsvasc score >3 -recently started on tikosyn by EP cardiologist and followed at Afib clinic.  - Currently in SR.  resume eliquis  5. Hypertension  - stable, continue metoprolol   DVT Prophylaxis: SCDs Code Status: full   Family Communication: son at bedside  Disposition Plan: SNF in 1-2days  Consultants:   [facility]   Procedures: EGD  Subjective: - shortness of breath-much improved, eating well, wondering abt rehab  Objective: Vitals:   12/12/16 2319 12/13/16 0554 12/13/16 0611 12/13/16 0853  BP: (!) 104/33 (!) 109/34    Pulse: 70 70    Resp:  18    Temp:  98.2 F (36.8 C)    TempSrc:      SpO2: 98% 95%  95%  Weight:   112.7 kg (248 lb 7.3 oz)   Height:        Intake/Output Summary (Last 24 hours) at 12/13/16 1146 Last data filed at 12/13/16 1009  Gross per 24 hour  Intake              900 ml  Output                0 ml  Net              900 ml   Filed Weights   12/11/16  0641 12/11/16 0915 12/13/16 0611  Weight: 115.8 kg (255 lb 6.4 oz) 115.7 kg (255 lb) 112.7 kg (248 lb 7.3 oz)    Examination:  General exam: AAOx3, no distress Respiratory system: CTAB Cardiovascular system: S1 & S2 heard, RRR Gastrointestinal system: Abdomen is  soft and nontender. Normal bowel sounds heard. Central nervous system: Alert and oriented. No focal neurological deficits. Extremities: No edema Skin: No rashes, lesions or ulcers Psychiatry: Judgement and insight appear normal. Mood & affect appropriate.     Data Reviewed:   CBC:  Recent Labs Lab 12/10/16 1005 12/11/16 0011 12/11/16 1222 12/12/16 0949 12/13/16 0418  WBC 4.6 3.4* 3.5* 4.4 4.7  HGB 8.0* 7.1* 8.3* 8.4* 8.1*  HCT 27.1* 23.7* 27.0* 28.4* 27.1*  MCV 91.6 91.2 90.0 90.2 89.4  PLT 147* 122* 101* 141* 323   Basic Metabolic Panel:  Recent Labs Lab 12/10/16 1005 12/10/16 1635 12/11/16 0011 12/12/16 0949 12/13/16 0418  NA 140  --  140 140 140  K 4.0  --  4.0 4.3 4.4  CL 105  --  105 102 96*  CO2 30  --  29 30 36*  GLUCOSE 96  --  115* 131* 112*  BUN 15  --  '15 12 13  '$ CREATININE 0.89  --  0.95 0.92 1.08*  CALCIUM 8.6*  --  8.6* 8.5* 8.6*  MG  --  2.1  --   --   --    GFR: Estimated Creatinine Clearance: 48.5 mL/min (A) (by C-G formula based on SCr of 1.08 mg/dL (H)). Liver Function Tests:  Recent Labs Lab 12/11/16 0011  AST 20  ALT 12*  ALKPHOS 40  BILITOT 0.3  PROT 4.7*  ALBUMIN 2.2*   No results for input(s): LIPASE, AMYLASE in the last 168 hours. No results for input(s): AMMONIA in the last 168 hours. Coagulation Profile:  Recent Labs Lab 12/11/16 0011  INR 1.34   Cardiac Enzymes:  Recent Labs Lab 12/10/16 1818 12/11/16 0011  TROPONINI 0.07* 0.07*   BNP (last 3 results) No results for input(s): PROBNP in the last 8760 hours. HbA1C: No results for input(s): HGBA1C in the last 72 hours. CBG: No results for input(s): GLUCAP in the last 168 hours. Lipid  Profile: No results for input(s): CHOL, HDL, LDLCALC, TRIG, CHOLHDL, LDLDIRECT in the last 72 hours. Thyroid Function Tests:  Recent Labs  12/11/16 0011  TSH 1.558   Anemia Panel:  Recent Labs  12/10/16 1635 12/11/16 0011  VITAMINB12 1,174*  --   FOLATE 23.0 20.1  FERRITIN 32  --   RETICCTPCT 4.1*  --    Urine analysis:    Component Value Date/Time   COLORURINE COLORLESS (A) 12/11/2016 1750   APPEARANCEUR CLEAR 12/11/2016 1750   LABSPEC 1.004 (L) 12/11/2016 1750   LABSPEC 1.025 09/23/2016 1531   PHURINE 6.0 12/11/2016 1750   GLUCOSEU NEGATIVE 12/11/2016 1750   GLUCOSEU Negative 09/23/2016 1531   HGBUR NEGATIVE 12/11/2016 1750   BILIRUBINUR NEGATIVE 12/11/2016 1750   BILIRUBINUR Negative 09/23/2016 1531   KETONESUR NEGATIVE 12/11/2016 1750   PROTEINUR NEGATIVE 12/11/2016 1750   UROBILINOGEN 0.2 09/23/2016 1531   NITRITE NEGATIVE 12/11/2016 1750   LEUKOCYTESUR LARGE (A) 12/11/2016 1750   LEUKOCYTESUR Moderate 09/23/2016 1531   Sepsis Labs: '@LABRCNTIP'$ (procalcitonin:4,lacticidven:4)  ) Recent Results (from the past 240 hour(s))  MRSA PCR Screening     Status: None   Collection Time: 12/10/16  8:53 PM  Result Value Ref Range Status   MRSA by PCR NEGATIVE NEGATIVE Final    Comment:        The GeneXpert MRSA Assay (FDA approved for NASAL specimens only), is one component of a comprehensive MRSA colonization surveillance program. It is not intended to diagnose MRSA infection nor to guide or monitor treatment for MRSA infections.          Radiology Studies: No results found.      Scheduled Meds: . atorvastatin  20 mg Oral q1800  . budesonide  0.25 mg Nebulization BID  . calcium-vitamin D  1 tablet Oral Q breakfast  . dofetilide  500 mcg Oral BID  . feeding supplement (ENSURE ENLIVE)  237 mL Oral TID BM  . fenofibrate  160 mg Oral Daily  . ferrous sulfate  325 mg Oral BID WC  . fluticasone  1 spray Each Nare Daily  . furosemide  40 mg Oral Daily   . ipratropium-albuterol  3 mL Nebulization BID  . magnesium oxide  400 mg Oral Daily  . metoprolol tartrate  25 mg Oral BID  . montelukast  10 mg Oral QHS  . pantoprazole  40 mg Oral Daily  . PARoxetine  20 mg Oral QHS  . potassium chloride  20  mEq Oral BID  . sodium chloride flush  3 mL Intravenous Q12H   Continuous Infusions: . sodium chloride Stopped (12/11/16 1030)     LOS: 3 days    Time spent: 68mn    PDomenic Polite MD [facility] Pager [phone] If 7PM-7AM, please contact night-coverage [email or web address] Password [email or web address] 12/13/2016, 11:46 AM

## 2016-12-13 NOTE — Progress Notes (Signed)
Daily Rounding Note  12/13/2016, 8:32 AM  LOS: 3 days   SUBJECTIVE:   Chief complaint: no complaints.  Stools brown.  Eating well.  Walked with walker and spent time in lounge chair yesterday.  No SOB of excessive fatigue.  No dizziness.     OBJECTIVE:         Vital signs in last 24 hours:    Temp:  [98 F (36.7 C)-98.4 F (36.9 C)] 98.2 F (36.8 C) (05/04 0554) Pulse Rate:  [61-76] 70 (05/04 0554) Resp:  [16-18] 18 (05/04 0554) BP: (90-109)/(31-42) 109/34 (05/04 0554) SpO2:  [95 %-98 %] 95 % (05/04 0554) Weight:  [112.7 kg (248 lb 7.3 oz)] 112.7 kg (248 lb 7.3 oz) (05/04 0611) Last BM Date: 12/12/16 Filed Weights   12/11/16 0641 12/11/16 0915 12/13/16 5188  Weight: 115.8 kg (255 lb 6.4 oz) 115.7 kg (255 lb) 112.7 kg (248 lb 7.3 oz)   General: pleasant, comfortable.  Not ill looking   Heart:  irreg, irreg.  Rate controlled.  Chest: clear bil.  No labored breathing or cough Abdomen: soft, NT, ND.  Obese.  Stool in ostomy is brown, soft.  Extremities: 1 + pedal edema Neuro/Psych:  Oriented x 3.  Appropriate.  Animated.    Intake/Output from previous day: 05/03 0701 - 05/04 0700 In: 920 [P.O.:920] Out: -   Intake/Output this shift: No intake/output data recorded.  Lab Results:  Recent Labs  12/11/16 1222 12/12/16 0949 12/13/16 0418  WBC 3.5* 4.4 4.7  HGB 8.3* 8.4* 8.1*  HCT 27.0* 28.4* 27.1*  PLT 101* 141* 153   BMET  Recent Labs  12/11/16 0011 12/12/16 0949 12/13/16 0418  NA 140 140 140  K 4.0 4.3 4.4  CL 105 102 96*  CO2 29 30 36*  GLUCOSE 115* 131* 112*  BUN '15 12 13  '$ CREATININE 0.95 0.92 1.08*  CALCIUM 8.6* 8.5* 8.6*   LFT  Recent Labs  12/11/16 0011  PROT 4.7*  ALBUMIN 2.2*  AST 20  ALT 12*  ALKPHOS 40  BILITOT 0.3   PT/INR  Recent Labs  12/11/16 0011  LABPROT 16.7*  INR 1.34   Hepatitis Panel No results for input(s): HEPBSAG, HCVAB, HEPAIGM, HEPBIGM in the last 72  hours.  Studies/Results: No results found.   Scheduled Meds: . atorvastatin  20 mg Oral q1800  . budesonide  0.25 mg Nebulization BID  . calcium-vitamin D  1 tablet Oral Q breakfast  . dofetilide  500 mcg Oral BID  . feeding supplement (ENSURE ENLIVE)  237 mL Oral TID BM  . fenofibrate  160 mg Oral Daily  . fluticasone  1 spray Each Nare Daily  . furosemide  40 mg Oral Daily  . ipratropium-albuterol  3 mL Nebulization BID  . magnesium oxide  400 mg Oral Daily  . metoprolol tartrate  25 mg Oral BID  . montelukast  10 mg Oral QHS  . pantoprazole  40 mg Oral BID  . PARoxetine  20 mg Oral QHS  . potassium chloride  20 mEq Oral BID  . sodium chloride flush  3 mL Intravenous Q12H   Continuous Infusions: . sodium chloride Stopped (12/11/16 1030)   PRN Meds:.sodium chloride, acetaminophen, ondansetron **OR** ondansetron (ZOFRAN) IV, senna-docusate, sodium chloride flush, traZODone   ASSESMENT:   *  FOBT + anemia.  No gross melena, BPR or hematemesis.   5/2 EGD: grade A reflux esophagitis.  Mild, non-hemorrhagic gastritis.  Ablation of  3 Duodenal AVMs.  BID PO Protonix in place.    *  Acute on chronic blood loss anemia.  On po iron at home.  s/p PRBC x 1.    *  Eliquis for hx PE 2015, A fib.   Planning to resume tomorrow.    *  Hx Stage IV T3 N3 MX (possible lung metastases) anal cancer, s/p resection and sigmoidostomy 07/2016, completed radiation therapy as well.  Colonoscopy was in 2015, no anal cancer detected.  Hx lung cancer, partial lobectomy 2013.    *  Slight AKI.     PLAN   *  Restart home dose po Iron (taking 650 mg in AM so will give 325 mg BID).  Change to once daily Protonix (no PPI PTA).   Hospitalist planning restart Eliquis   *  If declining Hgb or overt bleeding will need VCE and probably colonoscopy.  I don't think Hgb 8.3 >> 8.4 >> 8.1 constitutes much of a drop in Hgb.  Observe what happens after Eliquis resumed.    *  GI signing off.        Azucena Freed  12/13/2016, 8:32 AM Pager: [phone]

## 2016-12-13 NOTE — Care Management Important Message (Signed)
Important Message  Patient Details  Name: Tricia Hernandez MRN: [id number] Date of Birth: 06-23-1931   Medicare Important Message Given:  Yes    Nathen May 12/13/2016, 12:31 PM

## 2016-12-14 LAB — CBC
HEMATOCRIT: 29.7 % — AB (ref 36.0–46.0)
HEMOGLOBIN: 8.8 g/dL — AB (ref 12.0–15.0)
MCH: 26.5 pg (ref 26.0–34.0)
MCHC: 29.6 g/dL — ABNORMAL LOW (ref 30.0–36.0)
MCV: 89.5 fL (ref 78.0–100.0)
Platelets: 166 10*3/uL (ref 150–400)
RBC: 3.32 MIL/uL — ABNORMAL LOW (ref 3.87–5.11)
RDW: 18.1 % — ABNORMAL HIGH (ref 11.5–15.5)
WBC: 4.9 10*3/uL (ref 4.0–10.5)

## 2016-12-14 LAB — BASIC METABOLIC PANEL
ANION GAP: 8 (ref 5–15)
BUN: 17 mg/dL (ref 6–20)
CHLORIDE: 95 mmol/L — AB (ref 101–111)
CO2: 33 mmol/L — AB (ref 22–32)
Calcium: 8.5 mg/dL — ABNORMAL LOW (ref 8.9–10.3)
Creatinine, Ser: 1.05 mg/dL — ABNORMAL HIGH (ref 0.44–1.00)
GFR calc non Af Amer: 48 mL/min — ABNORMAL LOW (ref >60)
GFR, EST AFRICAN AMERICAN: 55 mL/min — AB (ref >60)
GLUCOSE: 141 mg/dL — AB (ref 65–99)
Potassium: 4.1 mmol/L (ref 3.5–5.1)
Sodium: 136 mmol/L (ref 135–145)

## 2016-12-14 MED ORDER — POTASSIUM CHLORIDE CRYS ER 20 MEQ PO TBCR
20.0000 meq | EXTENDED_RELEASE_TABLET | Freq: Two times a day (BID) | ORAL | Status: DC
  Administered 2016-12-14 – 2016-12-15 (×2): 20 meq via ORAL
  Filled 2016-12-14: qty 1

## 2016-12-14 MED ORDER — FUROSEMIDE 40 MG PO TABS
40.0000 mg | ORAL_TABLET | Freq: Every day | ORAL | Status: DC
  Administered 2016-12-14 – 2016-12-15 (×2): 40 mg via ORAL
  Filled 2016-12-14: qty 1

## 2016-12-14 MED ORDER — SODIUM CHLORIDE 0.9 % IV BOLUS (SEPSIS)
500.0000 mL | Freq: Once | INTRAVENOUS | Status: AC
  Administered 2016-12-14: 500 mL via INTRAVENOUS

## 2016-12-14 NOTE — Progress Notes (Signed)
PROGRESS NOTE    Tricia Hernandez  AST:[id number] DOB: [birth year] DOA: 12/10/2016 PCP: Flossie Buffy, NP  Brief Narrative:  Tricia Hernandez is a 83 y.o. female with known past medical history of fibrillation on Eliquis, pulmonary embolism, obesity, hypertension, depression, prior lung cancer, anal cancer status post colostomy, diastolic heart failure symptoms class II, presented with complaints of shortness of breath that has progressed over last 2 weeks, with severe SOB mostly with ambulation and activities of daily living  Assessment & Plan: 1. 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 Acute on chronic diastolic CHF  -due to anemia, component of diastolic CHF -BP dropped to 90s yesterday, s/p bolus overnight -TSH ok -hold todays dose of lasix  3.  h/o PE in 2015 -resumed eliquis  4. Atrial Fibrillation -chadsvasc score >3 -recently started on tikosyn by EP cardiologist and followed at Afib clinic.  -Currently in SR, resumed eliquis -repeat EKG for QTc  5. Hypertension  - stable, continue metoprolol   DVT Prophylaxis: SCDs Code Status: full   Family Communication: son at bedside  Disposition Plan: SNF tomorrow if stable  Consultants:   [facility]   Procedures: EGD  Subjective: - shortness of breath-much improved, eating well, wondering abt rehab  Objective: Vitals:   12/14/16 0735 12/14/16 0820 12/14/16 0936 12/14/16 0938  BP:  (!) 95/40 (!) 108/51 (!) 108/51  Pulse:  70  (!) 106  Resp:  17    Temp:  98.4 F (36.9 C)    TempSrc:  Oral    SpO2: 94% 100%    Weight:      Height:        Intake/Output Summary (Last 24 hours) at 12/14/16 1433 Last data filed at 12/13/16 2033  Gross per 24 hour  Intake                0 ml  Output              100 ml  Net             -100  ml   Filed Weights   12/11/16 0915 12/13/16 0611 12/14/16 0523  Weight: 115.7 kg (255 lb) 112.7 kg (248 lb 7.3 oz) 111.8 kg (246 lb 7.6 oz)    Examination:  General exam: AAOx3, no distress Respiratory system: CTAB Cardiovascular system: S1 & S2 heard, RRR Gastrointestinal system: Abdomen is  soft and nontender. Normal bowel sounds heard. Central nervous system: Alert and oriented. No focal neurological deficits. Extremities: No edema Skin: No rashes, lesions or ulcers Psychiatry: Judgement and insight appear normal. Mood & affect appropriate.     Data Reviewed:   CBC:  Recent Labs Lab 12/11/16 0011 12/11/16 1222 12/12/16 0949 12/13/16 0418 12/14/16 1109  WBC 3.4* 3.5* 4.4 4.7 4.9  HGB 7.1* 8.3* 8.4* 8.1* 8.8*  HCT 23.7* 27.0* 28.4* 27.1* 29.7*  MCV 91.2 90.0 90.2 89.4 89.5  PLT 122* 101* 141* 153 989   Basic Metabolic Panel:  Recent Labs Lab 12/10/16 1005 12/10/16 1635 12/11/16 0011 12/12/16 0949 12/13/16 0418 12/14/16 1109  NA 140  --  140 140 140 136  K 4.0  --  4.0 4.3 4.4 4.1  CL 105  --  105 102 96* 95*  CO2 30  --  29 30 36* 33*  GLUCOSE 96  --  115* 131* 112* 141*  BUN 15  --  '15 12 13 17  '$ CREATININE 0.89  --  0.95 0.92 1.08* 1.05*  CALCIUM 8.6*  --  8.6* 8.5* 8.6* 8.5*  MG  --  2.1  --   --   --   --    GFR: Estimated Creatinine Clearance: 49.7 mL/min (A) (by C-G formula based on SCr of 1.05 mg/dL (H)). Liver Function Tests:  Recent Labs Lab 12/11/16 0011  AST 20  ALT 12*  ALKPHOS 40  BILITOT 0.3  PROT 4.7*  ALBUMIN 2.2*   No results for input(s): LIPASE, AMYLASE in the last 168 hours. No results for input(s): AMMONIA in the last 168 hours. Coagulation Profile:  Recent Labs Lab 12/11/16 0011  INR 1.34   Cardiac Enzymes:  Recent Labs Lab 12/10/16 1818 12/11/16 0011  TROPONINI 0.07* 0.07*   BNP (last 3 results) No results for input(s): PROBNP in the last 8760 hours. HbA1C: No results for input(s): HGBA1C in the last 72  hours. CBG: No results for input(s): GLUCAP in the last 168 hours. Lipid Profile: No results for input(s): CHOL, HDL, LDLCALC, TRIG, CHOLHDL, LDLDIRECT in the last 72 hours. Thyroid Function Tests: No results for input(s): TSH, T4TOTAL, FREET4, T3FREE, THYROIDAB in the last 72 hours. Anemia Panel: No results for input(s): VITAMINB12, FOLATE, FERRITIN, TIBC, IRON, RETICCTPCT in the last 72 hours. Urine analysis:    Component Value Date/Time   COLORURINE COLORLESS (A) 12/11/2016 1750   APPEARANCEUR CLEAR 12/11/2016 1750   LABSPEC 1.004 (L) 12/11/2016 1750   LABSPEC 1.025 09/23/2016 1531   PHURINE 6.0 12/11/2016 1750   GLUCOSEU NEGATIVE 12/11/2016 1750   GLUCOSEU Negative 09/23/2016 1531   HGBUR NEGATIVE 12/11/2016 1750   BILIRUBINUR NEGATIVE 12/11/2016 1750   BILIRUBINUR Negative 09/23/2016 1531   KETONESUR NEGATIVE 12/11/2016 1750   PROTEINUR NEGATIVE 12/11/2016 1750   UROBILINOGEN 0.2 09/23/2016 1531   NITRITE NEGATIVE 12/11/2016 1750   LEUKOCYTESUR LARGE (A) 12/11/2016 1750   LEUKOCYTESUR Moderate 09/23/2016 1531   Sepsis Labs: '@LABRCNTIP'$ (procalcitonin:4,lacticidven:4)  ) Recent Results (from the past 240 hour(s))  MRSA PCR Screening     Status: None   Collection Time: 12/10/16  8:53 PM  Result Value Ref Range Status   MRSA by PCR NEGATIVE NEGATIVE Final    Comment:        The GeneXpert MRSA Assay (FDA approved for NASAL specimens only), is one component of a comprehensive MRSA colonization surveillance program. It is not intended to diagnose MRSA infection nor to guide or monitor treatment for MRSA infections.          Radiology Studies: No results found.      Scheduled Meds: . apixaban  5 mg Oral BID  . atorvastatin  20 mg Oral q1800  . budesonide  0.25 mg Nebulization BID  . calcium-vitamin D  1 tablet Oral Q breakfast  . dofetilide  500 mcg Oral BID  . feeding supplement (ENSURE ENLIVE)  237 mL Oral TID BM  . fenofibrate  160 mg Oral Daily  .  ferrous sulfate  325 mg Oral BID WC  . fluticasone  1 spray Each Nare Daily  . [START ON 12/15/2016] furosemide  40 mg Oral Daily  . ipratropium-albuterol  3 mL Nebulization BID  . magnesium oxide  400 mg Oral Daily  . metoprolol tartrate  25 mg Oral BID  . montelukast  10 mg Oral QHS  . pantoprazole  40 mg Oral Daily  . PARoxetine  20 mg  Oral QHS  . [START ON 12/15/2016] potassium chloride  20 mEq Oral BID  . sodium chloride flush  3 mL Intravenous Q12H   Continuous Infusions: . sodium chloride Stopped (12/11/16 1030)     LOS: 4 days    Time spent: 77mn    PDomenic Polite MD [facility] Pager [phone] If 7PM-7AM, please contact night-coverage [email or web address] Password [facility]5/12/2016, 2:33 PM

## 2016-12-14 NOTE — Progress Notes (Signed)
Pt BP has been running soft on call physician Donnal Debar  was paged about it, did not give her metoprolol and DIKOSYN  Because she did not meet the set parameters, this morning BP was still running soft on call gave an order for 500 normal saline bolus, order carried out and Delle Reining has been told during report to recheck BP after fluid bolus and page her attending physician

## 2016-12-15 DIAGNOSIS — I509 Heart failure, unspecified: Secondary | ICD-10-CM | POA: Diagnosis not present

## 2016-12-15 DIAGNOSIS — R2689 Other abnormalities of gait and mobility: Secondary | ICD-10-CM | POA: Diagnosis not present

## 2016-12-15 DIAGNOSIS — N3000 Acute cystitis without hematuria: Secondary | ICD-10-CM | POA: Diagnosis not present

## 2016-12-15 DIAGNOSIS — I4891 Unspecified atrial fibrillation: Secondary | ICD-10-CM | POA: Diagnosis not present

## 2016-12-15 DIAGNOSIS — Z022 Encounter for examination for admission to residential institution: Secondary | ICD-10-CM | POA: Diagnosis not present

## 2016-12-15 DIAGNOSIS — J181 Lobar pneumonia, unspecified organism: Secondary | ICD-10-CM | POA: Diagnosis not present

## 2016-12-15 DIAGNOSIS — K219 Gastro-esophageal reflux disease without esophagitis: Secondary | ICD-10-CM | POA: Diagnosis not present

## 2016-12-15 DIAGNOSIS — Z86718 Personal history of other venous thrombosis and embolism: Secondary | ICD-10-CM | POA: Diagnosis not present

## 2016-12-15 DIAGNOSIS — K922 Gastrointestinal hemorrhage, unspecified: Secondary | ICD-10-CM | POA: Diagnosis not present

## 2016-12-15 DIAGNOSIS — K31819 Angiodysplasia of stomach and duodenum without bleeding: Secondary | ICD-10-CM | POA: Diagnosis not present

## 2016-12-15 DIAGNOSIS — I5030 Unspecified diastolic (congestive) heart failure: Secondary | ICD-10-CM | POA: Diagnosis not present

## 2016-12-15 DIAGNOSIS — M6281 Muscle weakness (generalized): Secondary | ICD-10-CM | POA: Diagnosis not present

## 2016-12-15 DIAGNOSIS — K31811 Angiodysplasia of stomach and duodenum with bleeding: Secondary | ICD-10-CM | POA: Diagnosis not present

## 2016-12-15 DIAGNOSIS — D508 Other iron deficiency anemias: Secondary | ICD-10-CM | POA: Diagnosis not present

## 2016-12-15 DIAGNOSIS — J309 Allergic rhinitis, unspecified: Secondary | ICD-10-CM | POA: Diagnosis not present

## 2016-12-15 DIAGNOSIS — H43813 Vitreous degeneration, bilateral: Secondary | ICD-10-CM | POA: Diagnosis not present

## 2016-12-15 DIAGNOSIS — R238 Other skin changes: Secondary | ICD-10-CM | POA: Diagnosis not present

## 2016-12-15 DIAGNOSIS — J441 Chronic obstructive pulmonary disease with (acute) exacerbation: Secondary | ICD-10-CM | POA: Diagnosis not present

## 2016-12-15 DIAGNOSIS — Z961 Presence of intraocular lens: Secondary | ICD-10-CM | POA: Diagnosis not present

## 2016-12-15 DIAGNOSIS — Z8711 Personal history of peptic ulcer disease: Secondary | ICD-10-CM | POA: Diagnosis not present

## 2016-12-15 DIAGNOSIS — Z933 Colostomy status: Secondary | ICD-10-CM | POA: Diagnosis not present

## 2016-12-15 DIAGNOSIS — J42 Unspecified chronic bronchitis: Secondary | ICD-10-CM | POA: Diagnosis not present

## 2016-12-15 DIAGNOSIS — J189 Pneumonia, unspecified organism: Secondary | ICD-10-CM | POA: Diagnosis not present

## 2016-12-15 DIAGNOSIS — J301 Allergic rhinitis due to pollen: Secondary | ICD-10-CM | POA: Diagnosis not present

## 2016-12-15 DIAGNOSIS — D500 Iron deficiency anemia secondary to blood loss (chronic): Secondary | ICD-10-CM | POA: Diagnosis not present

## 2016-12-15 DIAGNOSIS — Z741 Need for assistance with personal care: Secondary | ICD-10-CM | POA: Diagnosis not present

## 2016-12-15 DIAGNOSIS — D649 Anemia, unspecified: Secondary | ICD-10-CM | POA: Diagnosis not present

## 2016-12-15 DIAGNOSIS — R600 Localized edema: Secondary | ICD-10-CM | POA: Diagnosis not present

## 2016-12-15 DIAGNOSIS — C3400 Malignant neoplasm of unspecified main bronchus: Secondary | ICD-10-CM | POA: Diagnosis not present

## 2016-12-15 DIAGNOSIS — Z8719 Personal history of other diseases of the digestive system: Secondary | ICD-10-CM | POA: Diagnosis not present

## 2016-12-15 DIAGNOSIS — Z86711 Personal history of pulmonary embolism: Secondary | ICD-10-CM | POA: Diagnosis not present

## 2016-12-15 DIAGNOSIS — R918 Other nonspecific abnormal finding of lung field: Secondary | ICD-10-CM | POA: Diagnosis not present

## 2016-12-15 DIAGNOSIS — I480 Paroxysmal atrial fibrillation: Secondary | ICD-10-CM | POA: Diagnosis not present

## 2016-12-15 DIAGNOSIS — F329 Major depressive disorder, single episode, unspecified: Secondary | ICD-10-CM | POA: Diagnosis not present

## 2016-12-15 DIAGNOSIS — J449 Chronic obstructive pulmonary disease, unspecified: Secondary | ICD-10-CM | POA: Diagnosis not present

## 2016-12-15 DIAGNOSIS — I481 Persistent atrial fibrillation: Secondary | ICD-10-CM | POA: Diagnosis not present

## 2016-12-15 DIAGNOSIS — K5730 Diverticulosis of large intestine without perforation or abscess without bleeding: Secondary | ICD-10-CM | POA: Diagnosis not present

## 2016-12-15 DIAGNOSIS — E785 Hyperlipidemia, unspecified: Secondary | ICD-10-CM | POA: Diagnosis not present

## 2016-12-15 DIAGNOSIS — I10 Essential (primary) hypertension: Secondary | ICD-10-CM | POA: Diagnosis not present

## 2016-12-15 DIAGNOSIS — B029 Zoster without complications: Secondary | ICD-10-CM | POA: Diagnosis not present

## 2016-12-15 DIAGNOSIS — R0902 Hypoxemia: Secondary | ICD-10-CM | POA: Diagnosis not present

## 2016-12-15 DIAGNOSIS — C342 Malignant neoplasm of middle lobe, bronchus or lung: Secondary | ICD-10-CM | POA: Diagnosis not present

## 2016-12-15 DIAGNOSIS — R262 Difficulty in walking, not elsewhere classified: Secondary | ICD-10-CM | POA: Diagnosis not present

## 2016-12-15 DIAGNOSIS — I700 Atherosclerosis of aorta: Secondary | ICD-10-CM | POA: Diagnosis not present

## 2016-12-15 DIAGNOSIS — I2510 Atherosclerotic heart disease of native coronary artery without angina pectoris: Secondary | ICD-10-CM | POA: Diagnosis not present

## 2016-12-15 DIAGNOSIS — Z85048 Personal history of other malignant neoplasm of rectum, rectosigmoid junction, and anus: Secondary | ICD-10-CM | POA: Diagnosis not present

## 2016-12-15 DIAGNOSIS — C210 Malignant neoplasm of anus, unspecified: Secondary | ICD-10-CM | POA: Diagnosis present

## 2016-12-15 DIAGNOSIS — R0602 Shortness of breath: Secondary | ICD-10-CM | POA: Diagnosis not present

## 2016-12-15 DIAGNOSIS — I110 Hypertensive heart disease with heart failure: Secondary | ICD-10-CM | POA: Diagnosis not present

## 2016-12-15 MED ORDER — SENNOSIDES-DOCUSATE SODIUM 8.6-50 MG PO TABS: 1.0000 | ORAL_TABLET | Freq: Every evening | ORAL | Status: DC | PRN

## 2016-12-15 MED ORDER — PANTOPRAZOLE SODIUM 40 MG PO TBEC: 40.0000 mg | DELAYED_RELEASE_TABLET | Freq: Every day | ORAL | Status: DC

## 2016-12-15 MED ORDER — FUROSEMIDE 40 MG PO TABS: 20.0000 mg | ORAL_TABLET | Freq: Every day | ORAL | 0 refills | Status: DC

## 2016-12-15 NOTE — Progress Notes (Signed)
Tricia Hernandez to be D/C'd Rehab per MD order.  Discussed with the patient and all questions fully answered.  VSS, Skin clean, dry and intact without evidence of skin break down, no evidence of skin tears noted. IV catheter discontinued intact. Site without signs and symptoms of complications. Dressing and pressure applied.  An After Visit Summary was printed and given to the patient. Patient received prescription.  D/c education completed with patient/family including follow up instructions, medication list, d/c activities limitations if indicated, with other d/c instructions as indicated by MD - patient able to verbalize understanding, all questions fully answered.   Patient instructed to return to ED, call 911, or call MD for any changes in condition.   Patient escorted via [place], and D/C home via private auto.  Milas Hock 12/15/2016 1:23 PM

## 2016-12-15 NOTE — Clinical Social Work Note (Signed)
Clinical Social Worker facilitated patient discharge including contacting patient family and facility to confirm patient discharge plans.  Clinical information faxed to facility and family agreeable with plan. Tricia Hernandez will transport pt to [facility]-- RN notified. RN to call [phone] for report prior to discharge.  Clinical Social Worker will sign off for now as social work intervention is no longer needed. Please consult [place] again if new need arises.  [place], [place]

## 2016-12-15 NOTE — Discharge Summary (Addendum)
Physician Discharge Summary  Tricia Hernandez GGY:[id number] DOB: 06-Nov-1930 DOA: 12/10/2016  PCP: Flossie Buffy, NP  Admit date: 12/10/2016 Discharge date: 12/15/2016  Time spent: 35 minutes  Recommendations for Outpatient Follow-up:  1. PCP in 1 week, please check CBC and Bmet in 1 week, started on low dose lasix, titrate as needed  Discharge Diagnoses:  Principal Problem:   Upper GI bleeding   Duodenal AVMs s/p ablation   SOB (shortness of breath)   Hypertension   Symptomatic anemia   Paroxysmal Atrial fibrillation (HCC), CHA2DS2-VASc Score 5   Persistent atrial fibrillation (HCC)   Chronic bronchitis (HCC)   Heme positive stool   Angiodysplasia of duodenum   Anal squamous cell cancer s/p Colostomy  Discharge Condition: stable  Diet recommendation: low sodium heart healthy  Filed Weights   12/11/16 0915 12/13/16 0611 12/14/16 0523  Weight: 115.7 kg (255 lb) 112.7 kg (248 lb 7.3 oz) 111.8 kg (246 lb 7.6 oz)    History of present illness:  Tricia Hernandez a 83 y.o.femalewith known past medical history of fibrillation on xarelto, pulmonary embolism, obesity, hypertension, depression, prior lung cancer, anal cancer status post colostomy, diastolic heart failure symptoms class II, presented with complaints of shortness of breath that has progressed over last 2 weeks, with severe SOB mostly with ambulation and activities of daily living  Hospital Course:  1. Upper GI Bleed -admitted with anemia/GI bleed -EGD with mild gastritis, esophagitis and three angiodysplastic lesions in the duodenum ablated with APC -Resumed Eliquis due to H/o PE in 2015, and AFib -Hb stable since ablation and no further bleeding -continue PPI at discharge  2. Acute on chronic diastolic CHF  -due to anemia, component of diastolic CHF -required IV lasix this admission, volume status improved -TSH ok -started on lasix '20mg'$  daily  3.  h/o PE in 2015 -resumed eliquis  4. Paroxysmal Atrial  Fibrillation -chadsvasc score >3 -recently started on tikosyn by EP cardiologist and followed at Afib clinic.  -Currently in SR, resumed eliquis, continue metoprolol and Tikosyn  5. Hypertension  - stable, continue metoprolol  6. Anal cancer -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  07/29/2016 end sigmoid colostomy.  Completed radiation therapy 09/25/16.  Consultants:   [facility]  Procedures: EGD Impression:                       - LA Grade A reflux esophagitis.                           - Mild, non-hemorrhagic gastritis.                           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                            Ablated with APC.                            - Otherwise normal examined duodenum.  Discharge Exam: Vitals:   12/15/16 0515 12/15/16 0835  BP: (!) 112/43 (!) 105/48  Pulse: (!) 59 64  Resp: 18   Temp: 97.7 F (36.5 C)     General: AAOx3 Cardiovascular: S1S2/RRR Respiratory: CTAB  Discharge Instructions  Discharge Instructions    Diet - low sodium heart healthy    Complete by:  As directed    Increase activity slowly    Complete by:  As directed      Current Discharge Medication List    START taking these medications   Details  furosemide (LASIX) 40 MG tablet Take 0.5 tablets (20 mg total) by mouth daily. Refills: 0    pantoprazole (PROTONIX) 40 MG tablet Take 1 tablet (40 mg total) by mouth daily.    senna-docusate (SENOKOT-S) 8.6-50 MG tablet Take 1 tablet by mouth at bedtime as needed for mild constipation.      CONTINUE these medications which have NOT CHANGED   Details  acetaminophen (TYLENOL) 325 MG tablet Take 650 mg by mouth every 6 (six) hours as needed for mild pain.    apixaban (ELIQUIS) 5 MG TABS tablet Take 1 tablet (5 mg total) by mouth 2 (two) times daily. Qty: 180 tablet, Refills: 1    atorvastatin (LIPITOR) 20 MG tablet Take 1 tablet  (20 mg total) by mouth daily. Qty: 90 tablet, Refills: 1   Associated Diagnoses: Mixed hyperlipidemia    budesonide (PULMICORT) 0.25 MG/2ML nebulizer solution Take 2 mLs (0.25 mg total) by nebulization 2 (two) times daily. Qty: 60 mL, Refills: 12    Calcium 600-200 MG-UNIT tablet Take 1 tablet by mouth daily.    dofetilide (TIKOSYN) 500 MCG capsule Take 1 capsule (500 mcg total) by mouth 2 (two) times daily. Qty: 180 capsule, Refills: 3    ENSURE PLUS (ENSURE PLUS) LIQD Take 237 mLs by mouth 3 (three) times daily between meals.    fenofibrate 160 MG tablet Take 1 tablet (160 mg total) by mouth daily. Qty: 90 tablet, Refills: 1   Associated Diagnoses: Mixed hyperlipidemia    ferrous sulfate 325 (65 FE) MG tablet Take 1 tablet (325 mg total) by mouth daily with breakfast. Qty: 90 tablet, Refills: 1   Associated Diagnoses: Iron deficiency anemia due to chronic blood loss    fluticasone (FLONASE) 50 MCG/ACT nasal spray Place 1 spray into both nostrils daily.    ipratropium-albuterol (DUONEB) 0.5-2.5 (3) MG/3ML SOLN Take 3 mLs by nebulization 2 (two) times daily. Qty: 540 mL, Refills: 0    magnesium oxide (MAG-OX) 400 (241.3 Mg) MG tablet Take 1 tablet (400 mg total) by mouth daily. Qty: 60 tablet, Refills: 1    metoprolol tartrate (LOPRESSOR) 25 MG tablet Take 1 tablet (25 mg total) by mouth 2 (two) times daily. Qty: 180 tablet, Refills: 2    montelukast (SINGULAIR) 10 MG tablet Take 1 tablet (10 mg total) by mouth at bedtime. Qty: 90 tablet, Refills: 1   Associated Diagnoses: Chronic bronchitis, unspecified chronic bronchitis type (HCC)    PARoxetine (PAXIL) 20 MG tablet Take 20 mg by mouth at bedtime.       STOP taking these medications     MONUROL 3 g PACK        Allergies  Allergen Reactions  . Penicillins Anaphylaxis    Reaction:  Unknown  Has patient had a PCN reaction causing immediate rash, facial/tongue/throat swelling, SOB or lightheadedness with hypotension:  Unsure Has patient had a PCN reaction causing severe rash involving mucus membranes or skin necrosis: Unsure Has patient had a PCN reaction that required hospitalization Unsure Has patient had a PCN reaction occurring within the last 10 years: Unsure If all of the above answers are "NO", then may proceed with Cephalosporin use.   [name] [Naproxen Sodium]  Hives  . Antihistamines, Chlorpheniramine-Type Rash    Reaction:  Unknown   . Aspirin Rash  . Benadryl [Diphenhydramine Hcl] Palpitations    Increases heart rate  . Codeine Rash  . Hydrochlorothiazide Rash  . Rocephin [Ceftriaxone Sodium In Dextrose] Rash  . Sulfa Antibiotics Rash   Follow-up Information    [name], [name], NP. Schedule an appointment as soon as possible for a visit in 1 week(s).   Specialty:  Internal Medicine Contact information: [address] [place] [address] [phone]            The results of significant diagnostics from this hospitalization (including imaging, microbiology, ancillary and laboratory) are listed below for reference.    Significant Diagnostic Studies: Dg Chest 2 View  Result Date: 12/10/2016 CLINICAL DATA:  Shortness of Breath EXAM: CHEST  2 VIEW COMPARISON:  09/16/2016, 08/26/16 FINDINGS: Cardiac shadow is stable. Scarring is again seen in the right lung consistent with the patient's known prior surgery. Increased pleural based soft tissue density is noted laterally in the right lung. No effusion is seen. No acute infiltrate is noted. No bony abnormality is seen. IMPRESSION: Postsurgical scarring in the right mid lung stable from previous exam. Slight increase in the degree of pleural based density. Nonemergent CT may be helpful for further evaluation Electronically Signed   By: Inez Catalina M.D.   On: 12/10/2016 10:29    Microbiology: Recent Results (from the past 240 hour(s))  MRSA PCR Screening     Status: None   Collection Time: 12/10/16  8:53 PM  Result Value Ref Range  Status   MRSA by PCR NEGATIVE NEGATIVE Final    Comment:        The GeneXpert MRSA Assay (FDA approved for NASAL specimens only), is one component of a comprehensive MRSA colonization surveillance program. It is not intended to diagnose MRSA infection nor to guide or monitor treatment for MRSA infections.      Labs: Basic Metabolic Panel:  Recent Labs Lab 12/10/16 1005 12/10/16 1635 12/11/16 0011 12/12/16 0949 12/13/16 0418 12/14/16 1109  NA 140  --  140 140 140 136  K 4.0  --  4.0 4.3 4.4 4.1  CL 105  --  105 102 96* 95*  CO2 30  --  29 30 36* 33*  GLUCOSE 96  --  115* 131* 112* 141*  BUN 15  --  '15 12 13 17  '$ CREATININE 0.89  --  0.95 0.92 1.08* 1.05*  CALCIUM 8.6*  --  8.6* 8.5* 8.6* 8.5*  MG  --  2.1  --   --   --   --    Liver Function Tests:  Recent Labs Lab 12/11/16 0011  AST 20  ALT 12*  ALKPHOS 40  BILITOT 0.3  PROT 4.7*  ALBUMIN 2.2*   No results for input(s): LIPASE, AMYLASE in the last 168 hours. No results for input(s): AMMONIA in the last 168 hours. CBC:  Recent Labs Lab 12/11/16 0011 12/11/16 1222 12/12/16 0949 12/13/16 0418 12/14/16 1109  WBC 3.4* 3.5* 4.4 4.7 4.9  HGB 7.1* 8.3* 8.4* 8.1* 8.8*  HCT 23.7* 27.0* 28.4* 27.1* 29.7*  MCV 91.2 90.0 90.2 89.4 89.5  PLT 122* 101* 141* 153 166   Cardiac Enzymes:  Recent Labs Lab 12/10/16 1818 12/11/16 0011  TROPONINI 0.07* 0.07*   BNP: BNP (last 3 results)  Recent Labs  07/22/16 2028 08/25/16 2340 12/10/16 1005  BNP 305.6* 298.0* 328.7*    ProBNP (last 3  results) No results for input(s): PROBNP in the last 8760 hours.  CBG: No results for input(s): GLUCAP in the last 168 hours.     SignedDomenic Polite MD.  [facility] 12/15/2016, 10:03 AM

## 2016-12-15 NOTE — Clinical Social Work Placement (Signed)
   CLINICAL SOCIAL WORK PLACEMENT  NOTE  Date:  12/15/2016  Patient Details  Name: Tricia Hernandez MRN: [id number] Date of Birth: [birth year]  Clinical Social Work is seeking post-discharge placement for this patient at the [place] level of care (*CSW will initial, date and re-position this form in  chart as items are completed):  Yes   Patient/family provided with [place] Work Department's list of facilities offering this level of care within the geographic area requested by the patient (or if unable, by the patient's family).  Yes   Patient/family informed of their freedom to choose among providers that offer the needed level of care, that participate in Medicare, Medicaid or managed care program needed by the patient, have an available bed and are willing to accept the patient.  Yes   Patient/family informed of [place]'s ownership interest in [facility] and [facility], as well as of the fact that they are under no obligation to receive care at these facilities.  PASRR submitted to EDS on       PASRR number received on       Existing PASRR number confirmed on 12/12/16     FL2 transmitted to all facilities in geographic area requested by pt/family on 12/12/16     FL2 transmitted to all facilities within larger geographic area on       Patient informed that his/her managed care company has contracts with or will negotiate with certain facilities, including the following:        Yes   Patient/family informed of bed offers received.  Patient [facility] bed at [facility]     Physician recommends and patient chooses bed at      Patient to be transferred to   on  .  Patient to be transferred to facility by       Patient family notified on   of transfer.  Name of family member notified:  Collie Siad     PHYSICIAN Please sign FL2     Additional Comment:    _______________________________________________ Eileen Stanford,  LCSW 12/15/2016, 10:59 AM

## 2016-12-17 DIAGNOSIS — J449 Chronic obstructive pulmonary disease, unspecified: Secondary | ICD-10-CM | POA: Diagnosis not present

## 2016-12-17 DIAGNOSIS — D500 Iron deficiency anemia secondary to blood loss (chronic): Secondary | ICD-10-CM | POA: Diagnosis not present

## 2016-12-17 DIAGNOSIS — K219 Gastro-esophageal reflux disease without esophagitis: Secondary | ICD-10-CM | POA: Diagnosis not present

## 2016-12-17 DIAGNOSIS — J301 Allergic rhinitis due to pollen: Secondary | ICD-10-CM | POA: Diagnosis not present

## 2016-12-17 DIAGNOSIS — C210 Malignant neoplasm of anus, unspecified: Secondary | ICD-10-CM | POA: Diagnosis not present

## 2016-12-17 DIAGNOSIS — E785 Hyperlipidemia, unspecified: Secondary | ICD-10-CM | POA: Diagnosis not present

## 2016-12-17 DIAGNOSIS — I480 Paroxysmal atrial fibrillation: Secondary | ICD-10-CM | POA: Diagnosis not present

## 2016-12-17 DIAGNOSIS — F329 Major depressive disorder, single episode, unspecified: Secondary | ICD-10-CM | POA: Diagnosis not present

## 2016-12-17 DIAGNOSIS — Z022 Encounter for examination for admission to residential institution: Secondary | ICD-10-CM | POA: Diagnosis not present

## 2016-12-17 DIAGNOSIS — I10 Essential (primary) hypertension: Secondary | ICD-10-CM | POA: Diagnosis not present

## 2016-12-17 DIAGNOSIS — I509 Heart failure, unspecified: Secondary | ICD-10-CM | POA: Diagnosis not present

## 2016-12-19 ENCOUNTER — Telehealth: Payer: Self-pay | Admitting: Nurse Practitioner

## 2016-12-19 DIAGNOSIS — Z433 Encounter for attention to colostomy: Secondary | ICD-10-CM

## 2016-12-19 NOTE — Telephone Encounter (Signed)
Pt daughter needs a colostomy bag and adhesive and alcohol prep, no sting, cannot use regular prep pad.  2458 is number on bag one piece unit   Needs a Rx for all of this.   [place]

## 2016-12-20 DIAGNOSIS — Z433 Encounter for attention to colostomy: Secondary | ICD-10-CM | POA: Insufficient documentation

## 2016-12-20 MED ORDER — OSTOMY SUPPLIES KIT: PACK | 11 refills | Status: DC

## 2016-12-20 NOTE — Telephone Encounter (Signed)
Written rx faxed, daughter is aware.

## 2016-12-20 NOTE — Telephone Encounter (Signed)
Spoke with daughter, inform her written rx is ready I just need to know where to fax this to. She will call and give me the fax #.

## 2016-12-20 NOTE — Telephone Encounter (Signed)
Fax Number  [phone]  [facility]

## 2016-12-26 ENCOUNTER — Telehealth: Payer: Self-pay | Admitting: *Deleted

## 2016-12-26 NOTE — Telephone Encounter (Signed)
Called patient's daughter - Cammy Copa to inquire about fu appt. on 12-31-16, asked if patient wants an appt. with Lona Millard, patient's daughter stated that she would like to come on 02-25-17, fu appt. to be @ 3:30 pm on 02-25-17, per patient's daughter request

## 2016-12-27 ENCOUNTER — Other Ambulatory Visit: Payer: Self-pay | Admitting: Emergency Medicine

## 2016-12-27 DIAGNOSIS — I480 Paroxysmal atrial fibrillation: Secondary | ICD-10-CM | POA: Diagnosis not present

## 2016-12-27 DIAGNOSIS — C3400 Malignant neoplasm of unspecified main bronchus: Secondary | ICD-10-CM

## 2016-12-27 DIAGNOSIS — J301 Allergic rhinitis due to pollen: Secondary | ICD-10-CM | POA: Diagnosis not present

## 2016-12-27 DIAGNOSIS — E785 Hyperlipidemia, unspecified: Secondary | ICD-10-CM | POA: Diagnosis not present

## 2016-12-27 DIAGNOSIS — D500 Iron deficiency anemia secondary to blood loss (chronic): Secondary | ICD-10-CM | POA: Diagnosis not present

## 2016-12-27 DIAGNOSIS — J449 Chronic obstructive pulmonary disease, unspecified: Secondary | ICD-10-CM | POA: Diagnosis not present

## 2016-12-27 DIAGNOSIS — I509 Heart failure, unspecified: Secondary | ICD-10-CM | POA: Diagnosis not present

## 2016-12-27 DIAGNOSIS — J181 Lobar pneumonia, unspecified organism: Secondary | ICD-10-CM | POA: Diagnosis not present

## 2016-12-27 DIAGNOSIS — I10 Essential (primary) hypertension: Secondary | ICD-10-CM | POA: Diagnosis not present

## 2016-12-27 DIAGNOSIS — K219 Gastro-esophageal reflux disease without esophagitis: Secondary | ICD-10-CM | POA: Diagnosis not present

## 2016-12-27 DIAGNOSIS — F329 Major depressive disorder, single episode, unspecified: Secondary | ICD-10-CM | POA: Diagnosis not present

## 2016-12-27 DIAGNOSIS — C210 Malignant neoplasm of anus, unspecified: Secondary | ICD-10-CM | POA: Diagnosis not present

## 2016-12-30 ENCOUNTER — Other Ambulatory Visit (HOSPITAL_BASED_OUTPATIENT_CLINIC_OR_DEPARTMENT_OTHER): Payer: Medicare Other

## 2016-12-30 ENCOUNTER — Ambulatory Visit (HOSPITAL_COMMUNITY)
Admission: RE | Admit: 2016-12-30 | Discharge: 2016-12-30 | Disposition: A | Discharge status: Home / Self Care | Payer: Medicare Other | Source: Ambulatory Visit | Attending: Hematology and Oncology | Admitting: Hematology and Oncology

## 2016-12-30 ENCOUNTER — Telehealth: Payer: Self-pay | Admitting: Internal Medicine

## 2016-12-30 DIAGNOSIS — D508 Other iron deficiency anemias: Secondary | ICD-10-CM

## 2016-12-30 DIAGNOSIS — R918 Other nonspecific abnormal finding of lung field: Secondary | ICD-10-CM | POA: Diagnosis not present

## 2016-12-30 DIAGNOSIS — I2510 Atherosclerotic heart disease of native coronary artery without angina pectoris: Secondary | ICD-10-CM | POA: Insufficient documentation

## 2016-12-30 DIAGNOSIS — K5730 Diverticulosis of large intestine without perforation or abscess without bleeding: Secondary | ICD-10-CM | POA: Insufficient documentation

## 2016-12-30 DIAGNOSIS — C3400 Malignant neoplasm of unspecified main bronchus: Secondary | ICD-10-CM | POA: Diagnosis not present

## 2016-12-30 DIAGNOSIS — C210 Malignant neoplasm of anus, unspecified: Secondary | ICD-10-CM | POA: Diagnosis not present

## 2016-12-30 DIAGNOSIS — I700 Atherosclerosis of aorta: Secondary | ICD-10-CM | POA: Insufficient documentation

## 2016-12-30 LAB — CBC WITH DIFFERENTIAL/PLATELET
BASO%: 0.4 % (ref 0.0–2.0)
Basophils Absolute: 0 10*3/uL (ref 0.0–0.1)
EOS ABS: 0.2 10*3/uL (ref 0.0–0.5)
EOS%: 2.6 % (ref 0.0–7.0)
HCT: 26.2 % — ABNORMAL LOW (ref 34.8–46.6)
HEMOGLOBIN: 8.3 g/dL — AB (ref 11.6–15.9)
LYMPH%: 14.4 % (ref 14.0–49.7)
MCH: 27.3 pg (ref 25.1–34.0)
MCHC: 31.8 g/dL (ref 31.5–36.0)
MCV: 86 fL (ref 79.5–101.0)
MONO#: 0.5 10*3/uL (ref 0.1–0.9)
MONO%: 8 % (ref 0.0–14.0)
NEUT%: 74.6 % (ref 38.4–76.8)
NEUTROS ABS: 4.4 10*3/uL (ref 1.5–6.5)
Platelets: 180 10*3/uL (ref 145–400)
RBC: 3.05 10*6/uL — AB (ref 3.70–5.45)
RDW: 17.7 % — AB (ref 11.2–14.5)
WBC: 5.9 10*3/uL (ref 3.9–10.3)
lymph#: 0.9 10*3/uL (ref 0.9–3.3)

## 2016-12-30 LAB — COMPREHENSIVE METABOLIC PANEL
ALT: 10 U/L (ref 0–55)
AST: 17 U/L (ref 5–34)
Albumin: 3.2 g/dL — ABNORMAL LOW (ref 3.5–5.0)
Alkaline Phosphatase: 46 U/L (ref 40–150)
Anion Gap: 10 mEq/L (ref 3–11)
BUN: 27.8 mg/dL — AB (ref 7.0–26.0)
CO2: 33 mEq/L — ABNORMAL HIGH (ref 22–29)
CREATININE: 1 mg/dL (ref 0.6–1.1)
Calcium: 9.7 mg/dL (ref 8.4–10.4)
Chloride: 101 mEq/L (ref 98–109)
EGFR: 50 mL/min/{1.73_m2} — ABNORMAL LOW (ref >90)
GLUCOSE: 99 mg/dL (ref 70–140)
POTASSIUM: 4.1 meq/L (ref 3.5–5.1)
Sodium: 144 mEq/L (ref 136–145)
Total Bilirubin: 0.32 mg/dL (ref 0.20–1.20)
Total Protein: 6.2 g/dL — ABNORMAL LOW (ref 6.4–8.3)

## 2016-12-30 MED ORDER — IOPAMIDOL (ISOVUE-300) INJECTION 61%
INTRAVENOUS | Status: AC
  Filled 2016-12-30: qty 100

## 2016-12-30 MED ORDER — IOPAMIDOL (ISOVUE-300) INJECTION 61%
100.0000 mL | Freq: Once | INTRAVENOUS | Status: AC | PRN
  Administered 2016-12-30: 100 mL via INTRAVENOUS

## 2016-12-30 NOTE — Telephone Encounter (Signed)
Patient's daughter is concerned that her mother is still bleeding from GI tract. Patient had labs drawn with oncology today.  Daughter was wondering if patient needed anything else today while they are in town.  See CBC Hgb stable.

## 2016-12-30 NOTE — Telephone Encounter (Signed)
Hgb is stable so good sign  Is she seeing melena?? Is supposed to be on 2 FeSO4 daily so wil make stools dark FYI to them  Would repeat Hgb again in 2-3 weeks please and do a ferritin  Can see them if desired after that vs doing phone f/u

## 2016-12-30 NOTE — Telephone Encounter (Signed)
I am not surprised she would be heme +

## 2016-12-30 NOTE — Telephone Encounter (Signed)
Daughter says that the nursing home tested her and she was guaiac positive. She is on iron BID.  She will bring her for labs in 2 weeks.

## 2016-12-31 ENCOUNTER — Ambulatory Visit: Payer: Medicare Other

## 2016-12-31 ENCOUNTER — Other Ambulatory Visit: Payer: Self-pay

## 2016-12-31 ENCOUNTER — Ambulatory Visit (HOSPITAL_COMMUNITY)
Admission: RE | Admit: 2016-12-31 | Discharge: 2016-12-31 | Disposition: A | Discharge status: Home / Self Care | Payer: No Typology Code available for payment source | Source: Ambulatory Visit | Attending: Hematology and Oncology | Admitting: Hematology and Oncology

## 2016-12-31 ENCOUNTER — Other Ambulatory Visit: Payer: Self-pay | Admitting: Hematology and Oncology

## 2016-12-31 ENCOUNTER — Encounter: Payer: Self-pay | Admitting: Hematology and Oncology

## 2016-12-31 ENCOUNTER — Ambulatory Visit: Admission: RE | Admit: 2016-12-31 | Payer: Medicare Other | Source: Ambulatory Visit | Admitting: Radiation Oncology

## 2016-12-31 ENCOUNTER — Ambulatory Visit (HOSPITAL_BASED_OUTPATIENT_CLINIC_OR_DEPARTMENT_OTHER): Payer: Medicare Other | Admitting: Hematology and Oncology

## 2016-12-31 DIAGNOSIS — D649 Anemia, unspecified: Secondary | ICD-10-CM

## 2016-12-31 DIAGNOSIS — C210 Malignant neoplasm of anus, unspecified: Secondary | ICD-10-CM | POA: Diagnosis not present

## 2016-12-31 LAB — PREPARE RBC (CROSSMATCH)

## 2016-12-31 LAB — ABO/RH: ABO/RH(D): O POS

## 2016-12-31 NOTE — Assessment & Plan Note (Signed)
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 It is unclear but the inguinal lymphadenopathy and the lung nodulescould be metastatic disease. it could be a stage IIIa versus stage IV.  Current Treatment: XRT to anus S/P diverting colostomy  Lung Nodules: PET/CT scan 09/17/2015: Focal anal activity SUV 13.3, inguinal lymph nodes are smaller (2.4 cm to 1.5 cm) and low-grade metabolic activity, faint nodularity in the lungs similar to prior, significant reduction in perirectal lymph node 9.2 cm to 0.4 cm.  Chest abdomen pelvis 12/30/2016: Nonspecific mild focal masslike wall thickening posterior left lower rectum, mild right inguinal lymphadenopathy decreased, scattered small bilateral pulmonary nodules stable to decreased from 07/25/2016  Radiology review: I discussed the radiology findings which seem to suggest stable disease.  Recommendation: Continue observation

## 2016-12-31 NOTE — Progress Notes (Signed)
Patient Care Team: [name], [name], NP as PCP - General (Internal Medicine)  DIAGNOSIS:  Encounter Diagnosis  Name Primary?  Marland Kitchen Anal cancer ([place])     SUMMARY OF ONCOLOGIC HISTORY:   Lung cancer ([place])    Anal cancer ([place])   07/25/2016 Initial Diagnosis    Perianal: Squamous cell cancer atleast 5 cm, Moderately differentiated T2N1 (lung nodules, inguinal LN unclear etiology) stage 3B vs Stage 4      08/13/2016 -  Radiation Therapy    Anal Radiation (diverting colostomy)       CHIEF COMPLIANT: Follow-up after recent CT scans and recent hospitalization for GI bleed and anemia  INTERVAL HISTORY: Tricia Hernandez is a 83 year old with above-mentioned history of anal carcinoma who received radiation therapy. She also has a history of atrial fibrillation for which she's on anticoagulation. She was admitted to the hospital recently with GI bleed and was transfused packed red cells. She is following with gastroenterology for this. Her hemoglobin has remained stable in spite of her stool occult bloods being positive. She today complains of dizziness lightheadedness and shortness of breath to exertion. She also has severe swelling of her lower extremities for which she takes Lasix.  REVIEW OF SYSTEMS:   Constitutional: Denies fevers, chills or abnormal weight loss Eyes: Denies blurriness of vision Ears, nose, mouth, throat, and face: Denies mucositis or sore throat Respiratory: Shortness of breath Cardiovascular: Shortness of breath and palpitation as well as paroxysmal nocturnal dyspnea Gastrointestinal:  Denies nausea, heartburn or change in bowel habits Skin: Denies abnormal skin rashes Lymphatics: Denies new lymphadenopathy or easy bruising Neurological:Denies numbness, tingling or new weaknesses Behavioral/Psych: Mood is stable, no new changes  Extremities: Bilateral lower extremity edema  All other systems were reviewed with the patient and are negative.  I have reviewed the  past medical history, past surgical history, social history and family history with the patient and they are unchanged from previous note.  ALLERGIES:  is allergic to penicillins; aleve [naproxen sodium]; antihistamines, chlorpheniramine-type; aspirin; benadryl [diphenhydramine hcl]; codeine; hydrochlorothiazide; rocephin [ceftriaxone sodium in dextrose]; and sulfa antibiotics.  MEDICATIONS:  Current Outpatient Prescriptions  Medication Sig Dispense Refill  . acetaminophen (TYLENOL) 325 MG tablet Take 650 mg by mouth every 6 (six) hours as needed for mild pain.    Marland Kitchen apixaban (ELIQUIS) 5 MG TABS tablet Take 1 tablet (5 mg total) by mouth 2 (two) times daily. 180 tablet 1  . atorvastatin (LIPITOR) 20 MG tablet Take 1 tablet (20 mg total) by mouth daily. 90 tablet 1  . budesonide (PULMICORT) 0.25 MG/2ML nebulizer solution Take 2 mLs (0.25 mg total) by nebulization 2 (two) times daily. 60 mL 12  . Calcium 600-200 MG-UNIT tablet Take 1 tablet by mouth daily.    Marland Kitchen dofetilide (TIKOSYN) 500 MCG capsule Take 1 capsule (500 mcg total) by mouth 2 (two) times daily. 180 capsule 3  . ENSURE PLUS (ENSURE PLUS) LIQD Take 237 mLs by mouth 3 (three) times daily between meals.    . fenofibrate 160 MG tablet Take 1 tablet (160 mg total) by mouth daily. 90 tablet 1  . ferrous sulfate 325 (65 FE) MG tablet Take 1 tablet (325 mg total) by mouth daily with breakfast. (Patient taking differently: Take 650 mg by mouth daily with breakfast. ) 90 tablet 1  . fluticasone (FLONASE) 50 MCG/ACT nasal spray Place 1 spray into both nostrils daily.    . furosemide (LASIX) 40 MG tablet Take 0.5 tablets (20 mg total) by mouth daily.  0  . ipratropium-albuterol (DUONEB) 0.5-2.5 (3) MG/3ML SOLN Take 3 mLs by nebulization 2 (two) times daily. 540 mL 0  . magnesium oxide (MAG-OX) 400 (241.3 Mg) MG tablet Take 1 tablet (400 mg total) by mouth daily. 60 tablet 1  . metoprolol tartrate (LOPRESSOR) 25 MG tablet Take 1 tablet (25 mg total)  by mouth 2 (two) times daily. 180 tablet 2  . montelukast (SINGULAIR) 10 MG tablet Take 1 tablet (10 mg total) by mouth at bedtime. 90 tablet 1  . Ostomy Supplies KIT 8514 is number on bag one piece unit. Colostomy bag and adhesive and alcohol prep, no sting, cannot use regular prep pad. 30 each 11  . pantoprazole (PROTONIX) 40 MG tablet Take 1 tablet (40 mg total) by mouth daily.    Marland Kitchen PARoxetine (PAXIL) 20 MG tablet Take 20 mg by mouth at bedtime.     . senna-docusate (SENOKOT-S) 8.6-50 MG tablet Take 1 tablet by mouth at bedtime as needed for mild constipation.     No current facility-administered medications for this visit.     PHYSICAL EXAMINATION: ECOG PERFORMANCE STATUS: 1 - Symptomatic but completely ambulatory  Vitals:   12/31/16 0918  BP: (!) 133/50  Pulse: 75  Resp: 18  Temp: 98.4 F (36.9 C)   Filed Weights   12/31/16 0918  Weight: 248 lb 12.8 oz (112.9 kg)    GENERAL:alert, no distress and comfortable SKIN: skin color, texture, turgor are normal, no rashes or significant lesions EYES: normal, Conjunctiva are pink and non-injected, sclera clear OROPHARYNX:no exudate, no erythema and lips, buccal mucosa, and tongue normal  NECK: supple, thyroid normal size, non-tender, without nodularity LYMPH:  no palpable lymphadenopathy in the cervical, axillary or inguinal LUNGS: clear to auscultation and percussion with normal breathing effort HEART: Atrial fibrillation ABDOMEN:abdomen soft, non-tender and normal bowel sounds MUSCULOSKELETAL:no cyanosis of digits and no clubbing  NEURO: alert & oriented x 3 with fluent speech, no focal motor/sensory deficits EXTREMITIES: Bilateral 2+ower extremity edema  LABORATORY DATA:  I have reviewed the data as listed   Chemistry      Component Value Date/Time   NA 144 12/30/2016 0850   K 4.1 12/30/2016 0850   CL 95 (L) 12/14/2016 1109   CO2 33 (H) 12/30/2016 0850   BUN 27.8 (H) 12/30/2016 0850   CREATININE 1.0 12/30/2016 0850       Component Value Date/Time   CALCIUM 9.7 12/30/2016 0850   ALKPHOS 46 12/30/2016 0850   AST 17 12/30/2016 0850   ALT 10 12/30/2016 0850   BILITOT 0.32 12/30/2016 0850       Lab Results  Component Value Date   WBC 5.9 12/30/2016   HGB 8.3 (L) 12/30/2016   HCT 26.2 (L) 12/30/2016   MCV 86.0 12/30/2016   PLT 180 12/30/2016   NEUTROABS 4.4 12/30/2016    ASSESSMENT & PLAN:  Anal cancer (HCC) Squamous cell carcinoma of the anus: Patient has bilateral inguinal lymphadenopathy and bilateral lung nodules. It is unclear but the inguinal lymphadenopathy and the lung nodulescould be metastatic disease. it could be a stage IIIa versus stage IV.  Current Treatment: XRT to anus S/P diverting colostomy  Lung Nodules: CT scan show that they have improved/stable no new nodules PET/CT scan 09/17/2015: Focal anal activity SUV 13.3, inguinal lymph nodes are smaller (2.4 cm to 1.5 cm) and low-grade metabolic activity, faint nodularity in the lungs similar to prior, significant reduction in perirectal lymph node 9.2 cm to 0.4 cm.  Chest abdomen pelvis  12/30/2016: Nonspecific mild focal masslike wall thickening posterior left lower rectum, mild right inguinal lymphadenopathy decreased, scattered small bilateral pulmonary nodules stable to decreased from 07/25/2016  Radiology review: I discussed the radiology findings which seem to suggest stable disease.  Recommendation: Continue observation Severe anemia due to GI bleeding: Because she is symptomatic I recommended that she get 1 unit of packed red cells. Our plan for the anal cancer is to do CT scans annually and follow-up every 6 months.    I spent 25 minutes talking to the patient of which more than half was spent in counseling and coordination of care.  No orders of the defined types were placed in this encounter.  The patient has a good understanding of the overall plan. she agrees with it. she will call with any problems that may  develop before the next visit here.   Rulon Eisenmenger, MD 12/31/16

## 2016-12-31 NOTE — Progress Notes (Signed)
Placed 1 unit of rbc on hold this week for blood transfusion. Labs added for t/s today.

## 2016-12-31 NOTE — Progress Notes (Signed)
Pt to receive 1 unit of blood at [facility] unit tomorrow 5/23. Pt to have lasix '20mg'$  IV x1 post transfusion of blood.

## 2017-01-01 ENCOUNTER — Ambulatory Visit (HOSPITAL_COMMUNITY)
Admission: RE | Admit: 2017-01-01 | Discharge: 2017-01-01 | Disposition: A | Discharge status: Home / Self Care | Payer: No Typology Code available for payment source | Source: Ambulatory Visit | Attending: Hematology and Oncology | Admitting: Hematology and Oncology

## 2017-01-01 DIAGNOSIS — D649 Anemia, unspecified: Secondary | ICD-10-CM | POA: Diagnosis not present

## 2017-01-01 MED ORDER — SODIUM CHLORIDE 0.9 % IV SOLN
250.0000 mL | Freq: Once | INTRAVENOUS | Status: AC
  Administered 2017-01-01: 250 mL via INTRAVENOUS

## 2017-01-01 MED ORDER — ACETAMINOPHEN 325 MG PO TABS
650.0000 mg | ORAL_TABLET | Freq: Once | ORAL | Status: AC
Start: 2017-01-01 — End: 2017-01-01
  Administered 2017-01-01: 650 mg via ORAL
  Filled 2017-01-01: qty 2

## 2017-01-01 MED ORDER — HEPARIN SOD (PORK) LOCK FLUSH 100 UNIT/ML IV SOLN: 250.0000 [IU] | INTRAVENOUS | Status: DC | PRN

## 2017-01-01 MED ORDER — SODIUM CHLORIDE 0.9% FLUSH: 3.0000 mL | INTRAVENOUS | Status: DC | PRN

## 2017-01-01 MED ORDER — FUROSEMIDE 10 MG/ML IJ SOLN
20.0000 mg | Freq: Once | INTRAMUSCULAR | Status: AC
Start: 2017-01-01 — End: 2017-01-01
  Administered 2017-01-01: 20 mg via INTRAVENOUS
  Filled 2017-01-01: qty 2

## 2017-01-01 MED ORDER — SODIUM CHLORIDE 0.9% FLUSH: 10.0000 mL | INTRAVENOUS | Status: DC | PRN

## 2017-01-01 MED ORDER — HEPARIN SOD (PORK) LOCK FLUSH 100 UNIT/ML IV SOLN: 500.0000 [IU] | Freq: Every day | INTRAVENOUS | Status: DC | PRN

## 2017-01-01 NOTE — Discharge Instructions (Signed)
Blood Transfusion , Adult A blood transfusion is a procedure in which you receive donated blood, including plasma, platelets, and red blood cells, through an IV tube. You may need a blood transfusion because of illness, surgery, or injury. The blood may come from a donor. You may also be able to donate blood for yourself (autologous blood donation) before a surgery if you know that you might require a blood transfusion. The blood given in a transfusion is made up of different types of cells. You may receive:  Red blood cells. These carry oxygen to the cells in the body.  White blood cells. These help you fight infections.  Platelets. These help your blood to clot.  Plasma. This is the liquid part of your blood and it helps with fluid imbalances. If you have hemophilia or another clotting disorder, you may also receive other types of blood products. Tell a health care provider about:  Any allergies you have.  All medicines you are taking, including vitamins, herbs, eye drops, creams, and over-the-counter medicines.  Any problems you or family members have had with anesthetic medicines.  Any blood disorders you have.  Any surgeries you have had.  Any medical conditions you have, including any recent fever or cold symptoms.  Whether you are pregnant or may be pregnant.  Any previous reactions you have had during a blood transfusion. What are the risks? Generally, this is a safe procedure. However, problems may occur, including:  Having an allergic reaction to something in the donated blood. Hives and itching may be symptoms of this type of reaction.  Fever. This may be a reaction to the white blood cells in the transfused blood. Nausea or chest pain may accompany a fever.  Iron overload. This can happen from having many transfusions.  Transfusion-related acute lung injury (TRALI). This is a rare reaction that causes lung damage. The cause is not known.TRALI can occur within hours  of a transfusion or several days later.  Sudden (acute) or delayed hemolytic reactions. This happens if your blood does not match the cells in your transfusion. Your body's defense system (immune system) may try to attack the new cells. This complication is rare. The symptoms include fever, chills, nausea, and low back pain or chest pain.  Infection or disease transmission. This is rare. What happens before the procedure?  You will have a blood test to determine your blood type. This is necessary to know what kind of blood your body will accept and to match it to the donor blood.  If you are going to have a planned surgery, you may be able to do an autologous blood donation. This may be done in case you need to have a transfusion.  If you have had an allergic reaction to a transfusion in the past, you may be given medicine to help prevent a reaction. This medicine may be given to you by mouth or through an IV tube.  You will have your temperature, blood pressure, and pulse monitored before the transfusion.  Follow instructions from your health care provider about eating and drinking restrictions.  Ask your health care provider about:  Changing or stopping your regular medicines. This is especially important if you are taking diabetes medicines or blood thinners.  Taking medicines such as aspirin and ibuprofen. These medicines can thin your blood. Do not take these medicines before your procedure if your health care provider instructs you not to. What happens during the procedure?  An IV tube will be   inserted into one of your veins.  The bag of donated blood will be attached to your IV tube. The blood will then enter through your vein.  Your temperature, blood pressure, and pulse will be monitored regularly during the transfusion. This monitoring is done to detect early signs of a transfusion reaction.  If you have any signs or symptoms of a reaction, your transfusion will be stopped and  you may be given medicine.  When the transfusion is complete, your IV tube will be removed.  Pressure may be applied to the IV site for a few minutes.  A bandage (dressing) will be applied. The procedure may vary among health care providers and hospitals. What happens after the procedure?  Your temperature, blood pressure, heart rate, breathing rate, and blood oxygen level will be monitored often.  Your blood may be tested to see how you are responding to the transfusion.  You may be warmed with fluids or blankets to maintain a normal body temperature. Summary  A blood transfusion is a procedure in which you receive donated blood, including plasma, platelets, and red blood cells, through an IV tube.  Your temperature, blood pressure, and pulse will be monitored before, during, and after the transfusion.  Your blood may be tested after the transfusion to see how your body has responded. This information is not intended to replace advice given to you by your health care provider. Make sure you discuss any questions you have with your health care provider. Document Released: 07/26/2000 Document Revised: 04/25/2016 Document Reviewed: 04/25/2016 Elsevier Interactive Patient Education  2017 Elsevier Inc.  

## 2017-01-01 NOTE — Procedures (Signed)
[facility]  Procedure Note  Tricia Hernandez FHL:[id number] DOB: May 30, 1931 DOA: 01/01/2017  Dr. Lindi Adie  Associated Diagnosis:  Anemia, unspecified type  Procedure Note: IV started, 1 unit PRCBS' transfused per order, IV discontinued.  Lasix given prior to blood per order.   Condition During Procedure:  Patient stable   Condition at Discharge: patient stable, son here to transport home   [facility], [place], [facility]

## 2017-01-02 LAB — TYPE AND SCREEN
ABO/RH(D): O POS
ANTIBODY SCREEN: NEGATIVE
Unit division: 0

## 2017-01-02 LAB — BPAM RBC
BLOOD PRODUCT EXPIRATION DATE: 201806062359
ISSUE DATE / TIME: 201805230859
UNIT TYPE AND RH: 5100

## 2017-01-09 DIAGNOSIS — Z961 Presence of intraocular lens: Secondary | ICD-10-CM | POA: Diagnosis not present

## 2017-01-09 DIAGNOSIS — H43813 Vitreous degeneration, bilateral: Secondary | ICD-10-CM | POA: Diagnosis not present

## 2017-01-13 ENCOUNTER — Encounter (INDEPENDENT_AMBULATORY_CARE_PROVIDER_SITE_OTHER): Payer: Self-pay

## 2017-01-13 ENCOUNTER — Telehealth: Payer: Self-pay | Admitting: Internal Medicine

## 2017-01-13 ENCOUNTER — Ambulatory Visit (INDEPENDENT_AMBULATORY_CARE_PROVIDER_SITE_OTHER): Payer: Medicare Other | Admitting: Physician Assistant

## 2017-01-13 ENCOUNTER — Encounter: Payer: Self-pay | Admitting: Physician Assistant

## 2017-01-13 ENCOUNTER — Other Ambulatory Visit (INDEPENDENT_AMBULATORY_CARE_PROVIDER_SITE_OTHER): Payer: Medicare Other

## 2017-01-13 VITALS — BP 104/48 | HR 72 | Ht 64.0 in

## 2017-01-13 DIAGNOSIS — Z8719 Personal history of other diseases of the digestive system: Secondary | ICD-10-CM

## 2017-01-13 DIAGNOSIS — K31811 Angiodysplasia of stomach and duodenum with bleeding: Secondary | ICD-10-CM | POA: Diagnosis not present

## 2017-01-13 DIAGNOSIS — D508 Other iron deficiency anemias: Secondary | ICD-10-CM

## 2017-01-13 DIAGNOSIS — C210 Malignant neoplasm of anus, unspecified: Secondary | ICD-10-CM | POA: Diagnosis not present

## 2017-01-13 LAB — CBC WITH DIFFERENTIAL/PLATELET
BASOS PCT: 0.4 % (ref 0.0–3.0)
Basophils Absolute: 0 10*3/uL (ref 0.0–0.1)
Eosinophils Absolute: 0.2 10*3/uL (ref 0.0–0.7)
Eosinophils Relative: 3.7 % (ref 0.0–5.0)
Hemoglobin: 8.1 g/dL — ABNORMAL LOW (ref 12.0–15.0)
LYMPHS PCT: 12.6 % (ref 12.0–46.0)
Lymphs Abs: 0.7 10*3/uL (ref 0.7–4.0)
MCHC: 31.6 g/dL (ref 30.0–36.0)
MCV: 88.6 fl (ref 78.0–100.0)
MONOS PCT: 9.3 % (ref 3.0–12.0)
Monocytes Absolute: 0.5 10*3/uL (ref 0.1–1.0)
NEUTROS ABS: 4.3 10*3/uL (ref 1.4–7.7)
Neutrophils Relative %: 74 % (ref 43.0–77.0)
PLATELETS: 196 10*3/uL (ref 150.0–400.0)
RBC: 2.9 Mil/uL — ABNORMAL LOW (ref 3.87–5.11)
RDW: 17.9 % — AB (ref 11.5–15.5)
WBC: 5.8 10*3/uL (ref 4.0–10.5)

## 2017-01-13 MED ORDER — METOPROLOL TARTRATE 25 MG PO TABS: 12.5000 mg | ORAL_TABLET | Freq: Two times a day (BID) | ORAL | 4 refills | Status: DC

## 2017-01-13 NOTE — Addendum Note (Signed)
Addendum  created 01/13/17 1448 by Oleta Mouse, MD   Sign clinical note

## 2017-01-13 NOTE — Telephone Encounter (Signed)
Patient's daughter came up to the office to discuss patient's BP. Patient saw her GI doctor today and her BP was 104/48 and HR was 72. Patient's daughter wanted Dr. Lovena Le to be aware and make any changes to patient's medications if necessary. Patient is at a nursing home and they would need to receive orders if there are changes. Informed patient's daughter her message would be sent to Dr. Lovena Le. Patient's daughter verbalized understanding. Will forward to Dr. Lovena Le for advisement.

## 2017-01-13 NOTE — Patient Instructions (Addendum)
Please go to the basement level to have your labs drawn.  Continue oral iron supplement. Continue Protonix 40 mg.   We have provided hemoccult cards x 3 for you to take home.   We have provided you with a lab order to take to [facility] every 2 weeks for 12 weeks.

## 2017-01-13 NOTE — Telephone Encounter (Signed)
New message       Pt c/o BP issue: STAT if pt c/o blurred vision, one-sided weakness or slurred speech  1. What are your last 5 BP readings?  6-2 108/50; 5-28- 118/45; 5-21 122/47; 5-19 115/52 2. Are you having any other symptoms (ex. Dizziness, headache, blurred vision, passed out?  Pt feels like she is going to pass out at times---when she get up to walk 3. What is your BP issue?  Patient's bp is low--(bottom number)

## 2017-01-13 NOTE — Telephone Encounter (Signed)
Called, spoke with pt's daughter, Collie Siad (on [place]). Informed Dr. Tanna Furry recommendation - reduce Metoprolol Tartrate to 12.5 mg twice daily. Daughter put the nurse Earlie Server, RN) on the phone (From [facility]). Dorothy stated to please fax over Rx order. Faxed order to [phone]. Informed to call back with questions or concerns. Dorothy verbalized understanding. Spoke to daughter again before hanging up, she verbalized understanding and thanked me for calling.

## 2017-01-13 NOTE — Progress Notes (Addendum)
Subjective:    Patient ID: Tricia Hernandez, female    DOB: [birth year], 83 y.o.   MRN: [id number]  HPI [name] is a pleasant 66 year old white female, known to Dr. Carlean Purl who is currently in [facility] after a prolonged illness this year. She has history of atrial fibrillation, for which she is on eliquis, history of PE, hypertension, prior lung CA. 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 She has since completed radiation. She is being followed by Dr. Lindi Adie. She is felt to have stage IIIB versus stage IV disease She was just restaged with CT scans on 12/30/2016 which showed mild focal masslike wall thickening in the posterior left lower rectum but decreased inguinal adenopathy and decrease in size of pulmonary nodules. She is felt to have stable disease and is being observed. Patient was seen by GI during hospitalization in May at which time she had evidence of GI bleeding in the setting of eliquis. She did require transfusions and has had one transfusion since discharge which was done on 12/31/2016. She underwent EGD per Dr. 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 Capsule endoscopy and/or colonoscopy mentioned if she continues to have problems with bleeding and transfusion requirement. Patient's primary complaint today is some dizziness and lightheadedness which she has been having intermittently. She and her daughter feel this was due to change in her cardiac meds with addition of Lasix for peripheral edema. Her blood pressure is lower than her usual baseline. She's not noticed any melena. She says her stool is always on the dark side in the colostomy bag but is on chronic iron. Stool was checked for occult blood by the rehabilitation facility last week and noted to be heme positive. Patient says  she felt like her stool was darker at that point -she was therefore sent back to GI. Last hemoglobin on 12/30/2016 8.3 hematocrit of 26.7 and received 1 unit packed RBCs after that.   Review of Systems Pertinent positive and negative review of systems were noted in the above HPI section.  All other review of systems was otherwise negative.  Outpatient Encounter Prescriptions as of 01/13/2017  Medication Sig  . acetaminophen (TYLENOL) 325 MG tablet Take 650 mg by mouth every 6 (six) hours as needed for mild pain.  Marland Kitchen apixaban (ELIQUIS) 5 MG TABS tablet Take 1 tablet (5 mg total) by mouth 2 (two) times daily.  Marland Kitchen atorvastatin (LIPITOR) 20 MG tablet Take 1 tablet (20 mg total) by mouth daily.  . budesonide (PULMICORT) 0.25 MG/2ML nebulizer solution Take 2 mLs (0.25 mg total) by nebulization 2 (two) times daily.  . Calcium 600-200 MG-UNIT tablet Take 1 tablet by mouth daily.  Marland Kitchen dofetilide (TIKOSYN) 500 MCG capsule Take 1 capsule (500 mcg total) by mouth 2 (two) times daily.  Marland Kitchen ENSURE PLUS (ENSURE PLUS) LIQD Take 237 mLs by mouth 3 (three) times daily between meals.  . fenofibrate 160 MG tablet Take 1 tablet (160 mg total) by mouth daily.  . ferrous sulfate 325 (65 FE) MG tablet Take 1 tablet (325 mg total) by mouth daily with breakfast. (Patient taking differently: Take 650 mg by mouth daily with breakfast. )  . fluticasone (FLONASE) 50 MCG/ACT nasal spray Place 1 spray into both nostrils daily.  . furosemide (LASIX) 40 MG tablet Take 0.5 tablets (20 mg  total) by mouth daily.  Marland Kitchen ipratropium-albuterol (DUONEB) 0.5-2.5 (3) MG/3ML SOLN Take 3 mLs by nebulization 2 (two) times daily.  . magnesium oxide (MAG-OX) 400 (241.3 Mg) MG tablet Take 1 tablet (400 mg total) by mouth daily.  . metoprolol tartrate (LOPRESSOR) 25 MG tablet Take 1 tablet (25 mg total) by mouth 2 (two) times daily. (Patient taking differently: Take 25 mg by mouth 2 (two) times daily. Takes IF BP is 100 and above)  . montelukast  (SINGULAIR) 10 MG tablet Take 1 tablet (10 mg total) by mouth at bedtime.  [employer] KIT 8514 is number on bag one piece unit. Colostomy bag and adhesive and alcohol prep, no sting, cannot use regular prep pad.  . pantoprazole (PROTONIX) 40 MG tablet Take 1 tablet (40 mg total) by mouth daily.  Marland Kitchen PARoxetine (PAXIL) 20 MG tablet Take 20 mg by mouth at bedtime.   . senna-docusate (SENOKOT-S) 8.6-50 MG tablet Take 1 tablet by mouth at bedtime as needed for mild constipation.   No facility-administered encounter medications on file as of 01/13/2017.    Allergies  Allergen Reactions  . Penicillins Anaphylaxis    Reaction:  Unknown  Has patient had a PCN reaction causing immediate rash, facial/tongue/throat swelling, SOB or lightheadedness with hypotension: Unsure Has patient had a PCN reaction causing severe rash involving mucus membranes or skin necrosis: Unsure Has patient had a PCN reaction that required hospitalization Unsure Has patient had a PCN reaction occurring within the last 10 years: Unsure If all of the above answers are "NO", then may proceed with Cephalosporin use.   [name] [Naproxen Sodium] Hives  . Antihistamines, Chlorpheniramine-Type Rash    Reaction:  Unknown   . Aspirin Rash  . Benadryl [Diphenhydramine Hcl] Palpitations    Increases heart rate  . Codeine Rash  . Hydrochlorothiazide Rash  . Rocephin [Ceftriaxone Sodium In Dextrose] Rash  . Sulfa Antibiotics Rash   Patient Active Problem List   Diagnosis Date Noted  . Colostomy care ([place]) 12/20/2016  . Angiodysplasia of duodenum   . GI bleeding 12/10/2016  . Heme positive stool 12/10/2016  . Mixed hyperlipidemia 11/05/2016  . Chronic bronchitis ([place]) 11/04/2016  . Persistent atrial fibrillation ([place]) 10/07/2016  . HCAP (healthcare-associated pneumonia) 08/26/2016  . Acute respiratory failure with hypoxia ([place]) 08/26/2016  . Essential hypertension   . Lung nodule   . Palliative care encounter   .  Dyspnea 07/23/2016  . SOB (shortness of breath) 07/22/2016  . Symptomatic anemia 07/22/2016  . Anal cancer ([place]) 07/22/2016  . BRBPR (bright red blood per rectum) 07/22/2016  . Elevated troponin 07/22/2016  . Atrial fibrillation ([place]), CHA2DS2-VASc Score 5 07/22/2016  . Pulmonary embolus ([place])   . Obesity   . Lung cancer ([place])   . Hypertension    Social History   Social History  . Marital status: Widowed    Spouse name: N/A  . Number of children: 5  . Years of education: N/A   Occupational History  . retired    Social History Main Topics  . Smoking status: Former Smoker    Packs/day: 0.50    Years: 15.00    Types: Cigarettes    Quit date: 1976  . Smokeless tobacco: Never Used  . Alcohol use No  . Drug use: No  . Sexual activity: No   Other Topics Concern  . Not on file   Social History Narrative  . No narrative on file    Ms. Colaizzi's family history includes  Breast cancer in her sister; CVA in her mother; Cancer in her brother; Cervical cancer in her daughter; Heart Problems in her brother; Heart attack in her mother; Hypertension in her mother; Leukemia in her daughter and son; Multiple myeloma in her mother; Prostate cancer in her brother; Skin cancer in her sister; Throat cancer in her brother and brother.      Objective:    Vitals:   01/13/17 1333  BP: (!) 104/48  Pulse: 72    Physical Exam  well-developed elderly white female in no acute distress, she is using a rolling walker, accompanied by her daughter, pleasant blood pressure 104/48 pulse 72, O2 sat 92 on room air, BMI not done. HEENT ;nontraumatic normocephalic EOMI PERRLA sclera are anicteric, Patient is pale-appearing, Pulmonary ;clear bilaterally, Cardiovascular ;regular rate and rhythm with S1-S2, Abdomen ;large soft nontender bowel sounds are active she has a colostomy in the left lower quadrant with brown stool, midline incisional scar, Rectal ;exam not done, Extremities; 1+ edema bilateral lower  extremities to the knee, Neuropsych; mood and affect appropriate       Assessment & Plan:   #46 83 year old white female with multiple medical problems with recent hospitalization with GI bleeding in setting of chronic eliquis use. 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 Most recent staging scans show stable disease #4 history of atrial fibrillation #5 history of PE   #6 hypertension #7 recent complaints of intermittent dizziness-this may be a combination of anemia and lowered blood pressure with addition of Lasix  Plan; repeat CBC today, also discussed following serial CBCs which will be done every 2 weeks over the next couple of months to assure stability He would like to have these drawn in [place] at [facility] and have the results faxed here. Daughter was given Hemoccult cards today at her request to keep on hand at home should they have any concerns for bleeding. There also asked to contact us for any recurrent melena. She will continue oral iron supplement, ferrous sulfate 325 twice daily. She will follow up with Dr. Carlean Purl as needed. We discussed potential need for periodic transfusion and also possible capsule endoscopy should she have ongoing issues with GI blood loss.   Raidon Swanner S Reine Bristow PA-C 01/13/2017   Cc: Flossie Buffy, NP   Agree with Ms. Genia Harold assessment and plan. Gatha Mayer, MD, [facility]

## 2017-01-14 ENCOUNTER — Telehealth: Payer: Self-pay | Admitting: Physician Assistant

## 2017-01-14 NOTE — Telephone Encounter (Signed)
Advised of the 8.1 hgb. No advice or comments made. Will call her back once the results are reviewed.

## 2017-01-17 ENCOUNTER — Telehealth: Payer: Self-pay | Admitting: Physician Assistant

## 2017-01-17 DIAGNOSIS — D649 Anemia, unspecified: Secondary | ICD-10-CM | POA: Diagnosis not present

## 2017-01-17 NOTE — Telephone Encounter (Signed)
She will receive 2 units today. Repeat CBC on Monday 01/20/17. Daughter will not bring her to the ER. She will wait for a phone call from the results. PCP will check it.

## 2017-01-17 NOTE — Telephone Encounter (Signed)
Tricia Hernandez, please review chart- HGB has drifted another 1.5 gm and being transfused today... Some discussion when she was in hospiatl per Pyrtle re- possible capsule and or colon though she has known AVM's and is on Eliquis for prior PE..want to keep monitoring or do capsule?Marland Kitchen. If she could get off Eliquis wouls stop oozing...Marland KitchenMarland Kitchen

## 2017-01-20 DIAGNOSIS — R238 Other skin changes: Secondary | ICD-10-CM | POA: Diagnosis not present

## 2017-01-20 DIAGNOSIS — B029 Zoster without complications: Secondary | ICD-10-CM | POA: Diagnosis not present

## 2017-01-20 DIAGNOSIS — D500 Iron deficiency anemia secondary to blood loss (chronic): Secondary | ICD-10-CM | POA: Diagnosis not present

## 2017-01-21 NOTE — Telephone Encounter (Signed)
OK 

## 2017-01-21 NOTE — Telephone Encounter (Signed)
Spoke with the daughter Ms Brayton El. Since the patient is in a rehab center, there is a doctor monitoring her. She will be receiving a "weekly injection to help her biuld her red blood cells" also. The daughter will continue to relay her progress "in case I have to bring her back to [place] so Dr Carlean Purl will not be in the dark."

## 2017-01-21 NOTE — Telephone Encounter (Signed)
Most recent Hgb is 8>9. Previously 8.1 on 01/13/17. Do you want her to continue with weekly hgb checks?

## 2017-01-21 NOTE — Telephone Encounter (Signed)
Patient daughter states pt had blood count yesterday and it was 8.9 and she wanted to let Amy and Dr.Gessner know.

## 2017-01-21 NOTE — Telephone Encounter (Signed)
I suggest a monthly CBC - she may be getting that through oncology and if so then do it there

## 2017-01-27 ENCOUNTER — Telehealth: Payer: Self-pay | Admitting: Physician Assistant

## 2017-01-28 NOTE — Telephone Encounter (Signed)
Ok , as long as she stay at 8 or above-does not need transfusion, continue with current plan of q 2week HGB's - please forward next labs to Dr Carlean Purl as I will be out on leave

## 2017-01-28 NOTE — Telephone Encounter (Signed)
Tricia Hernandez advised. PCP is checking weekly at this time.

## 2017-02-04 DIAGNOSIS — D500 Iron deficiency anemia secondary to blood loss (chronic): Secondary | ICD-10-CM | POA: Diagnosis not present

## 2017-02-04 DIAGNOSIS — C210 Malignant neoplasm of anus, unspecified: Secondary | ICD-10-CM | POA: Diagnosis not present

## 2017-02-04 DIAGNOSIS — J301 Allergic rhinitis due to pollen: Secondary | ICD-10-CM | POA: Diagnosis not present

## 2017-02-04 DIAGNOSIS — I480 Paroxysmal atrial fibrillation: Secondary | ICD-10-CM | POA: Diagnosis not present

## 2017-02-04 DIAGNOSIS — I509 Heart failure, unspecified: Secondary | ICD-10-CM | POA: Diagnosis not present

## 2017-02-04 DIAGNOSIS — N3000 Acute cystitis without hematuria: Secondary | ICD-10-CM | POA: Diagnosis not present

## 2017-02-04 DIAGNOSIS — I10 Essential (primary) hypertension: Secondary | ICD-10-CM | POA: Diagnosis not present

## 2017-02-04 DIAGNOSIS — E785 Hyperlipidemia, unspecified: Secondary | ICD-10-CM | POA: Diagnosis not present

## 2017-02-04 DIAGNOSIS — F329 Major depressive disorder, single episode, unspecified: Secondary | ICD-10-CM | POA: Diagnosis not present

## 2017-02-04 DIAGNOSIS — K219 Gastro-esophageal reflux disease without esophagitis: Secondary | ICD-10-CM | POA: Diagnosis not present

## 2017-02-04 DIAGNOSIS — J449 Chronic obstructive pulmonary disease, unspecified: Secondary | ICD-10-CM | POA: Diagnosis not present

## 2017-02-04 DIAGNOSIS — B029 Zoster without complications: Secondary | ICD-10-CM | POA: Diagnosis not present

## 2017-02-06 ENCOUNTER — Ambulatory Visit: Payer: Medicare Other | Admitting: Urology

## 2017-02-10 ENCOUNTER — Telehealth: Payer: Self-pay | Admitting: Internal Medicine

## 2017-02-11 DIAGNOSIS — D500 Iron deficiency anemia secondary to blood loss (chronic): Secondary | ICD-10-CM | POA: Diagnosis not present

## 2017-02-16 NOTE — Telephone Encounter (Signed)
If they have not done a f/u hgb ask them to and fax to me or send me the f/u hgb if done  Dx chronic blood loss anemia

## 2017-02-17 NOTE — Telephone Encounter (Signed)
Patient's daughter reports that they did lab on her mom today.  She will have them fax it to Dr. Carlean Purl

## 2017-02-18 ENCOUNTER — Telehealth: Payer: Self-pay | Admitting: Physician Assistant

## 2017-02-18 ENCOUNTER — Ambulatory Visit: Payer: Medicare Other | Admitting: Urology

## 2017-02-18 DIAGNOSIS — J449 Chronic obstructive pulmonary disease, unspecified: Secondary | ICD-10-CM | POA: Diagnosis not present

## 2017-02-18 DIAGNOSIS — I480 Paroxysmal atrial fibrillation: Secondary | ICD-10-CM | POA: Diagnosis not present

## 2017-02-18 DIAGNOSIS — C210 Malignant neoplasm of anus, unspecified: Secondary | ICD-10-CM | POA: Diagnosis not present

## 2017-02-18 DIAGNOSIS — E785 Hyperlipidemia, unspecified: Secondary | ICD-10-CM | POA: Diagnosis not present

## 2017-02-18 DIAGNOSIS — R600 Localized edema: Secondary | ICD-10-CM | POA: Diagnosis not present

## 2017-02-18 DIAGNOSIS — J301 Allergic rhinitis due to pollen: Secondary | ICD-10-CM | POA: Diagnosis not present

## 2017-02-18 DIAGNOSIS — K219 Gastro-esophageal reflux disease without esophagitis: Secondary | ICD-10-CM | POA: Diagnosis not present

## 2017-02-18 DIAGNOSIS — F329 Major depressive disorder, single episode, unspecified: Secondary | ICD-10-CM | POA: Diagnosis not present

## 2017-02-18 DIAGNOSIS — D500 Iron deficiency anemia secondary to blood loss (chronic): Secondary | ICD-10-CM | POA: Diagnosis not present

## 2017-02-18 DIAGNOSIS — R262 Difficulty in walking, not elsewhere classified: Secondary | ICD-10-CM | POA: Diagnosis not present

## 2017-02-18 DIAGNOSIS — I509 Heart failure, unspecified: Secondary | ICD-10-CM | POA: Diagnosis not present

## 2017-02-18 DIAGNOSIS — I10 Essential (primary) hypertension: Secondary | ICD-10-CM | POA: Diagnosis not present

## 2017-02-18 NOTE — Telephone Encounter (Signed)
FYI

## 2017-02-18 NOTE — Telephone Encounter (Signed)
Tricia Hernandez will try to arrange for one of the son's to bring the patient to an appointment. Presently appointment is planned for Friday. Patient is on Eliquis.

## 2017-02-18 NOTE — Telephone Encounter (Signed)
If they can come see me Friday I ask that we work her in to my AM schedule  Is she on the Eliquis still?

## 2017-02-20 DIAGNOSIS — I481 Persistent atrial fibrillation: Secondary | ICD-10-CM | POA: Diagnosis not present

## 2017-02-20 DIAGNOSIS — J441 Chronic obstructive pulmonary disease with (acute) exacerbation: Secondary | ICD-10-CM | POA: Diagnosis not present

## 2017-02-20 DIAGNOSIS — I110 Hypertensive heart disease with heart failure: Secondary | ICD-10-CM | POA: Diagnosis not present

## 2017-02-20 DIAGNOSIS — F329 Major depressive disorder, single episode, unspecified: Secondary | ICD-10-CM | POA: Diagnosis not present

## 2017-02-20 DIAGNOSIS — I5030 Unspecified diastolic (congestive) heart failure: Secondary | ICD-10-CM | POA: Diagnosis not present

## 2017-02-20 DIAGNOSIS — K219 Gastro-esophageal reflux disease without esophagitis: Secondary | ICD-10-CM | POA: Diagnosis not present

## 2017-02-21 ENCOUNTER — Encounter (INDEPENDENT_AMBULATORY_CARE_PROVIDER_SITE_OTHER): Payer: Self-pay

## 2017-02-21 ENCOUNTER — Other Ambulatory Visit (INDEPENDENT_AMBULATORY_CARE_PROVIDER_SITE_OTHER): Payer: Medicare Other

## 2017-02-21 ENCOUNTER — Encounter: Payer: Self-pay | Admitting: Internal Medicine

## 2017-02-21 ENCOUNTER — Ambulatory Visit (INDEPENDENT_AMBULATORY_CARE_PROVIDER_SITE_OTHER): Payer: Medicare Other | Admitting: Internal Medicine

## 2017-02-21 VITALS — BP 108/50 | HR 60 | Ht 64.0 in | Wt 269.0 lb

## 2017-02-21 DIAGNOSIS — Z7901 Long term (current) use of anticoagulants: Secondary | ICD-10-CM

## 2017-02-21 DIAGNOSIS — D500 Iron deficiency anemia secondary to blood loss (chronic): Secondary | ICD-10-CM

## 2017-02-21 DIAGNOSIS — R601 Generalized edema: Secondary | ICD-10-CM | POA: Diagnosis not present

## 2017-02-21 DIAGNOSIS — K5520 Angiodysplasia of colon without hemorrhage: Secondary | ICD-10-CM | POA: Diagnosis not present

## 2017-02-21 LAB — CBC WITH DIFFERENTIAL/PLATELET
BASOS ABS: 0 10*3/uL (ref 0.0–0.1)
BASOS PCT: 0.4 % (ref 0.0–3.0)
EOS PCT: 3.4 % (ref 0.0–5.0)
Eosinophils Absolute: 0.2 10*3/uL (ref 0.0–0.7)
HCT: 25.1 % — ABNORMAL LOW (ref 36.0–46.0)
Hemoglobin: 8.2 g/dL — ABNORMAL LOW (ref 12.0–15.0)
LYMPHS ABS: 0.6 10*3/uL — AB (ref 0.7–4.0)
Lymphocytes Relative: 10 % — ABNORMAL LOW (ref 12.0–46.0)
MCHC: 32.6 g/dL (ref 30.0–36.0)
MCV: 87.9 fl (ref 78.0–100.0)
Monocytes Absolute: 0.5 10*3/uL (ref 0.1–1.0)
Monocytes Relative: 9.5 % (ref 3.0–12.0)
NEUTROS ABS: 4.3 10*3/uL (ref 1.4–7.7)
NEUTROS PCT: 76.7 % (ref 43.0–77.0)
PLATELETS: 179 10*3/uL (ref 150.0–400.0)
RBC: 2.86 Mil/uL — ABNORMAL LOW (ref 3.87–5.11)
RDW: 17.2 % — AB (ref 11.5–15.5)
WBC: 5.6 10*3/uL (ref 4.0–10.5)

## 2017-02-21 LAB — BASIC METABOLIC PANEL
BUN: 22 mg/dL (ref 6–23)
CALCIUM: 9.2 mg/dL (ref 8.4–10.5)
CO2: 38 mEq/L — ABNORMAL HIGH (ref 19–32)
CREATININE: 1 mg/dL (ref 0.40–1.20)
Chloride: 98 mEq/L (ref 96–112)
GFR: 56.2 mL/min — AB (ref >60.00)
Glucose, Bld: 97 mg/dL (ref 70–99)
Potassium: 4.1 mEq/L (ref 3.5–5.1)
Sodium: 143 mEq/L (ref 135–145)

## 2017-02-21 LAB — BRAIN NATRIURETIC PEPTIDE: Pro B Natriuretic peptide (BNP): 354 pg/mL — ABNORMAL HIGH (ref 0.0–100.0)

## 2017-02-21 LAB — FERRITIN: Ferritin: 39.5 ng/mL (ref 10.0–291.0)

## 2017-02-21 NOTE — Progress Notes (Signed)
Call - daughter - tell her labs show Hgb 8 - stable Kidney function ok  I want her (patient) to take a full tablet of furosemide twice a day AM and early PM over weekend then take that daily for now to get fluid off

## 2017-02-21 NOTE — Progress Notes (Signed)
Tricia Hernandez 83 y.o. [birth year] [id number]  Assessment & Plan:   Encounter Diagnoses  Name Primary?  Marland Kitchen Anemia due to chronic blood loss Yes  . Angiodysplasia of intestinal tract   . Generalized edema   . Long term current use of anticoagulant      I think she probably has persistent bleeding from angiodysplasia. It may be that's her iron stores are not repleted either. The Eliquis anticoagulation is problematic but with her A. fib and history of pulmonary emboli is appropriate.  She looks like she's in pulmonary edema and perhaps really anasarca today, and may have congestive heart failure though she doesn't carry a diagnosis of that. The anemia is probably driving this.  I'm going to check a BNP, be met, CBC and a ferritin level. She may need another transfusion, she may need iron infusion. Capsule endoscopy of the small bowel will be undertaken. If she has diffuse angiodysplasia I'm not sure that anything further should be done other than supportive care consideration of stopping Eliquis. If there are angiodysplastic lesions localized and reachable by push endoscope like an enteroscopy we can consider that. Repeating a colonoscopy is an option that was done about 3 years ago but she didn't have any angiodysplasia that I'm aware of. What we know his she had diverticulosis hemorrhoids and a small sigmoid adenoma. She has iron colored stools that I ensure or heme positive but did not look like melena. I think her blood loss is slow but persistent.  She will probably need an increase in her diuretics but I want to see what her creatinine is etc. before we do that.  I appreciate the opportunity to care for this patient. CC: [name], [name], NP   Subjective:   Chief Complaint:Anemia  HPI A she is here with her son, she has a complicated medical history that includes prior pulmonary embolism, anal cancer treated with radiation and chemotherapy and she has a colostomy, and she has  duodenal AVM status post ablation by Dr. Elmo Putt in June, with persistent anemia and getting blood about once a week. Hemoglobin has been in the 7 range while she was in a rehabilitation facility. Stools are dark as seen in her colostomy. She also feels somewhat short of breath and is complaining of progressive lower extremity edema that is painful. She is in a wheelchair and she is on oxygen. Allergies  Allergen Reactions  . Penicillins Anaphylaxis    Reaction:  Unknown  Has patient had a PCN reaction causing immediate rash, facial/tongue/throat swelling, SOB or lightheadedness with hypotension: Unsure Has patient had a PCN reaction causing severe rash involving mucus membranes or skin necrosis: Unsure Has patient had a PCN reaction that required hospitalization Unsure Has patient had a PCN reaction occurring within the last 10 years: Unsure If all of the above answers are "NO", then may proceed with Cephalosporin use.   [name] [Naproxen Sodium] Hives  . Antihistamines, Chlorpheniramine-Type Rash    Reaction:  Unknown   . Aspirin Rash  . Benadryl [Diphenhydramine Hcl] Palpitations    Increases heart rate  . Codeine Rash  . Hydrochlorothiazide Rash  . Rocephin [Ceftriaxone Sodium In Dextrose] Rash  . Sulfa Antibiotics Rash   Current Meds  Medication Sig  . apixaban (ELIQUIS) 5 MG TABS tablet Take 1 tablet (5 mg total) by mouth 2 (two) times daily.  Marland Kitchen atorvastatin (LIPITOR) 20 MG tablet Take 1 tablet (20 mg total) by mouth daily.  . budesonide (PULMICORT) 0.25 MG/2ML nebulizer  solution Take 2 mLs (0.25 mg total) by nebulization 2 (two) times daily.  . Calcium 600-200 MG-UNIT tablet Take 1 tablet by mouth daily.  Marland Kitchen dofetilide (TIKOSYN) 500 MCG capsule Take 1 capsule (500 mcg total) by mouth 2 (two) times daily.  Marland Kitchen ENSURE PLUS (ENSURE PLUS) LIQD Take 237 mLs by mouth once a week. 3 times weekly  . ergocalciferol (VITAMIN D2) 50000 units capsule Take 50,000 Units by mouth once a week.  .  fenofibrate 160 MG tablet Take 1 tablet (160 mg total) by mouth daily.  . ferrous sulfate 325 (65 FE) MG tablet Take 1 tablet (325 mg total) by mouth daily with breakfast. (Patient taking differently: Take 650 mg by mouth daily with breakfast. )  . fluticasone (FLONASE) 50 MCG/ACT nasal spray Place 1 spray into both nostrils daily.  . furosemide (LASIX) 40 MG tablet Take 0.5 tablets (20 mg total) by mouth daily.  Marland Kitchen gabapentin (NEURONTIN) 300 MG capsule 3 (three) times daily.  Marland Kitchen ipratropium-albuterol (DUONEB) 0.5-2.5 (3) MG/3ML SOLN Take 3 mLs by nebulization 2 (two) times daily.  . magnesium oxide (MAG-OX) 400 (241.3 Mg) MG tablet Take 1 tablet (400 mg total) by mouth daily.  . metoprolol tartrate (LOPRESSOR) 25 MG tablet Take 0.5 tablets (12.5 mg total) by mouth 2 (two) times daily.  . montelukast (SINGULAIR) 10 MG tablet Take 1 tablet (10 mg total) by mouth at bedtime.  [employer] KIT 8514 is number on bag one piece unit. Colostomy bag and adhesive and alcohol prep, no sting, cannot use regular prep pad.  . pantoprazole (PROTONIX) 40 MG tablet Take 1 tablet (40 mg total) by mouth daily.  Marland Kitchen PARoxetine (PAXIL) 20 MG tablet Take 20 mg by mouth at bedtime.   . senna-docusate (SENOKOT-S) 8.6-50 MG tablet Take 1 tablet by mouth at bedtime as needed for mild constipation.   Past Medical History:  Diagnosis Date  . 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 Anemia   . Anxiety   . Atrial fibrillation ([place])   . Basal cell carcinoma of skin of nose   . CHF (congestive heart failure) ([place]) 11/2015  . Chronic bronchitis ([place])   . Chronic depression   . Colostomy in place [facility]) 07/2016  . Diverticular disease   . Fibrocystic disease of breast   . Fracture of shoulder   . GERD (gastroesophageal reflux disease)   . GI bleed 12/10/2016   in setting of no PPI. 5/2 EGD: grade A reflux esophagitis.  Mild, non-hemorrhagic gastritis.  Ablation of 3 Duodenal  AVMs  . Herpes zoster   . History of blood transfusion    for vaginal, uterine bleeding leading to hysterectomy.  in 12/2016 transfused x 1 for GI bleed.   Marland Kitchen History of hiatal hernia   . Hyperlipidemia   . Hypertension   . Labyrinthitis   . Lung cancer ([place])    RML  . Obesity   . Paroxysmal supraventricular tachycardia ([place])   . Peptic ulcer of stomach    hx  . Pneumonia 1980s X 1; 08/2016   "walking; double"  . Pulmonary embolism ([place]) 05/2014   "left lung"  . Vitamin D deficiency    Past Surgical History:  Procedure Laterality Date  . APPENDECTOMY  1959  . BASAL CELL CARCINOMA EXCISION     nose  . BREAST CYST ASPIRATION Right 2016?  Marland Kitchen CATARACT EXTRACTION W/ INTRAOCULAR LENS  IMPLANT, BILATERAL Bilateral 1990s  .  CHOLECYSTECTOMY OPEN  1980s  . COLOSTOMY N/A 07/29/2016   Procedure: COLOSTOMY;  Surgeon: Clovis Riley, MD;  Location: [place];  Service: General;  Laterality: N/A;  . DILATION AND CURETTAGE OF UTERUS    . ESOPHAGOGASTRODUODENOSCOPY N/A 12/11/2016   Procedure: ESOPHAGOGASTRODUODENOSCOPY (EGD);  Surgeon: Jerene Bears, MD;  [facility] ENDOSCOPY;  Service: Endoscopy;  Laterality: N/A;  . EVALUATION UNDER ANESTHESIA WITH HEMORRHOIDECTOMY AND PROCTOSCOPY N/A 07/25/2016   Procedure: EXAM UNDER ANESTHESIA, EXCISION PERIANAL MASS.;  Surgeon: Judeth Horn, MD;  Location: [place];  Service: General;  Laterality: N/A;  Prone position  . LAPAROSCOPIC DIVERTED COLOSTOMY N/A 07/29/2016   Procedure: ATTEMPTED LAPAROSCOPIC ASSISTED COLOSTOMY;  Surgeon: Clovis Riley, MD;  Location: [place];  Service: General;  Laterality: N/A;  . LUNG REMOVAL, PARTIAL Right 2013   RML mass/carcinoid, Dr Lurena Nida  . TONSILLECTOMY AND ADENOIDECTOMY  1960s  . VAGINAL HYSTERECTOMY  1959   "partial"   Social History   Social History  . Marital status: Widowed    Spouse name: N/A  . Number of children: 5  . Years of education: N/A   Occupational History  . retired    Social History Main Topics   . Smoking status: Former Smoker    Packs/day: 0.50    Years: 15.00    Types: Cigarettes    Quit date: 1976  . Smokeless tobacco: Never Used  . Alcohol use No  . Drug use: No  . Sexual activity: No    family history includes Breast cancer in her sister; CVA in her mother; Cancer in her brother; Cervical cancer in her daughter; Heart Problems in her brother; Heart attack in her mother; Hypertension in her mother; Leukemia in her daughter and son; Multiple myeloma in her mother; Prostate cancer in her brother; Skin cancer in her sister; Throat cancer in her brother and brother.   Review of Systems As per history of present illness  Objective:   Physical Exam BP (!) 108/50   Pulse 60   Ht 5' 4"  (1.626 m)   Wt 269 lb (122 kg)   BMI 46.17 kg/m  Chronically ill on O2, obese ww in wheelchair Eyes anicteric Lungs rales 1/2 up bilat Ht distant s1s2 abd obese - colostomy LLQ with iron colored stool looks healthy o/w Ext 2+ firm pitting edema 3/4 pretib nilat no cyanosis Skin pale

## 2017-02-21 NOTE — Patient Instructions (Addendum)
CAPSULE ENDOSCOPY PATIENT INSTRUCTION SHEET  Tricia Hernandez 06-28-1931 [id number]   1. 02/25/17 Seven (7) days prior to capsule endoscopy stop taking iron supplements and carafate.  2. 03/02/17 Two (2) days prior to capsule endoscopy stop taking aspirin or any arthritis drugs.  3. 03/03/17 Day before capsule endoscopy purchase a 238 gram bottle of Miralax from the laxative section of your drug store, and a 32 oz. bottle of Gatorade (no red).    4. 03/03/17 One (1) day prior to capsule endoscopy: a) Stop smoking. b) Eat a regular diet until 12:00 Noon. c) After 12:00 Noon take only the following: Black coffee  Jell-O (no fruit or red Jell-o) Water   Bouillon (chicken or beef) 7-Up   Cranberry Juice Tea   Kool-Aid Popsicle (not red) Sprite   Coke Ginger Ale  Pepsi Mountain Dew Gatorade d) At 6:00 pm the evening before your appointment, drink 7 capfuls (105 grams) of Miralax with 32 oz. Gatorade. Drink 8 oz every 15 minutes until gone. e) Nothing to eat or drink after midnight except medications with a sip of water.  5. 03/04/17 Day of capsule endoscopy: Wear loose two-piece clothing to your exam. No medications for 2 hours prior to your test.  6. Please arrive at [facility]  3rd floor patient registration area by 8 am on: 03/04/17.   For any questions: Call [facility] at [phone] and ask to speak with one of the capsule endoscopy nurses.   Your physician has requested that you go to the basement for the following lab work before leaving today: CBC, BMET, Ferritin, B-Peptide  We will call you with results and plans.   I appreciate the opportunity to care for you. Silvano Rusk, MD, [facility]

## 2017-02-21 NOTE — Progress Notes (Signed)
Tricia Hernandez 83 y.o. 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   Pain:Denies pain. Nausea/ Vomiting:Nausea on Saturday, went to [facility] admitted, it was extra fluid in her body all over. Diarrhea:Has a colostomy, no diarrhea. Skin irritation:Colostomy stoma normal pink healhy tissue. Vaginal/Rectal bleeding:None, Intestinal  bleeding per daughter, had blood transfusion in May 23,2018 one unit  and [name], [place] June 15 2 units, July 3,10 2018 one unit of blood each date Fatigue:Having fatigue most of the day. Loss of appetite:Good appetite eating three meals a day eating fruit snacks. Weight: Wt Readings from Last 3 Encounters:  02/25/17 258 lb 9.6 oz (117.3 kg)  02/25/17 258 lb 9.6 oz (117.3 kg)  02/23/17 259 lb 0.7 oz (117.5 kg)  O2 2 l/m nasal cannular while in [facility] June 2018 SOB with exertion BP 124/60   Pulse 70   Temp 98.7 F (37.1 C) (Oral)   Resp 18   Ht 5\' 4"  (1.626 m)   Wt 258 lb 9.6 oz (117.3 kg)   SpO2 99%   BMI 44.39 kg/m

## 2017-02-22 ENCOUNTER — Encounter (HOSPITAL_COMMUNITY): Payer: Self-pay | Admitting: Emergency Medicine

## 2017-02-22 ENCOUNTER — Observation Stay (HOSPITAL_COMMUNITY)
Admission: EM | Admit: 2017-02-22 | Discharge: 2017-02-23 | Disposition: A | Discharge status: Home / Self Care | Payer: Medicare Other | Attending: Internal Medicine | Admitting: Internal Medicine

## 2017-02-22 ENCOUNTER — Emergency Department (HOSPITAL_COMMUNITY): Payer: Medicare Other

## 2017-02-22 DIAGNOSIS — F419 Anxiety disorder, unspecified: Secondary | ICD-10-CM | POA: Insufficient documentation

## 2017-02-22 DIAGNOSIS — K31819 Angiodysplasia of stomach and duodenum without bleeding: Secondary | ICD-10-CM | POA: Insufficient documentation

## 2017-02-22 DIAGNOSIS — I471 Supraventricular tachycardia: Secondary | ICD-10-CM | POA: Insufficient documentation

## 2017-02-22 DIAGNOSIS — K219 Gastro-esophageal reflux disease without esophagitis: Secondary | ICD-10-CM

## 2017-02-22 DIAGNOSIS — K210 Gastro-esophageal reflux disease with esophagitis: Secondary | ICD-10-CM | POA: Insufficient documentation

## 2017-02-22 DIAGNOSIS — I8312 Varicose veins of left lower extremity with inflammation: Secondary | ICD-10-CM | POA: Insufficient documentation

## 2017-02-22 DIAGNOSIS — I8311 Varicose veins of right lower extremity with inflammation: Secondary | ICD-10-CM | POA: Insufficient documentation

## 2017-02-22 DIAGNOSIS — Z933 Colostomy status: Secondary | ICD-10-CM | POA: Insufficient documentation

## 2017-02-22 DIAGNOSIS — R0600 Dyspnea, unspecified: Secondary | ICD-10-CM

## 2017-02-22 DIAGNOSIS — I481 Persistent atrial fibrillation: Secondary | ICD-10-CM | POA: Diagnosis not present

## 2017-02-22 DIAGNOSIS — Z85118 Personal history of other malignant neoplasm of bronchus and lung: Secondary | ICD-10-CM | POA: Diagnosis not present

## 2017-02-22 DIAGNOSIS — Z79899 Other long term (current) drug therapy: Secondary | ICD-10-CM | POA: Insufficient documentation

## 2017-02-22 DIAGNOSIS — Z886 Allergy status to analgesic agent status: Secondary | ICD-10-CM | POA: Insufficient documentation

## 2017-02-22 DIAGNOSIS — K625 Hemorrhage of anus and rectum: Secondary | ICD-10-CM | POA: Insufficient documentation

## 2017-02-22 DIAGNOSIS — Z9981 Dependence on supplemental oxygen: Secondary | ICD-10-CM | POA: Insufficient documentation

## 2017-02-22 DIAGNOSIS — I110 Hypertensive heart disease with heart failure: Secondary | ICD-10-CM | POA: Diagnosis not present

## 2017-02-22 DIAGNOSIS — Z7902 Long term (current) use of antithrombotics/antiplatelets: Secondary | ICD-10-CM | POA: Insufficient documentation

## 2017-02-22 DIAGNOSIS — Z880 Allergy status to penicillin: Secondary | ICD-10-CM | POA: Insufficient documentation

## 2017-02-22 DIAGNOSIS — C210 Malignant neoplasm of anus, unspecified: Secondary | ICD-10-CM | POA: Diagnosis not present

## 2017-02-22 DIAGNOSIS — Z85828 Personal history of other malignant neoplasm of skin: Secondary | ICD-10-CM | POA: Diagnosis not present

## 2017-02-22 DIAGNOSIS — E559 Vitamin D deficiency, unspecified: Secondary | ICD-10-CM | POA: Insufficient documentation

## 2017-02-22 DIAGNOSIS — Z8719 Personal history of other diseases of the digestive system: Secondary | ICD-10-CM | POA: Diagnosis not present

## 2017-02-22 DIAGNOSIS — I509 Heart failure, unspecified: Secondary | ICD-10-CM | POA: Diagnosis not present

## 2017-02-22 DIAGNOSIS — Z87891 Personal history of nicotine dependence: Secondary | ICD-10-CM | POA: Diagnosis not present

## 2017-02-22 DIAGNOSIS — Z6841 Body Mass Index (BMI) 40.0 and over, adult: Secondary | ICD-10-CM | POA: Insufficient documentation

## 2017-02-22 DIAGNOSIS — I5033 Acute on chronic diastolic (congestive) heart failure: Secondary | ICD-10-CM | POA: Diagnosis not present

## 2017-02-22 DIAGNOSIS — J9601 Acute respiratory failure with hypoxia: Secondary | ICD-10-CM | POA: Diagnosis not present

## 2017-02-22 DIAGNOSIS — I872 Venous insufficiency (chronic) (peripheral): Secondary | ICD-10-CM

## 2017-02-22 DIAGNOSIS — I10 Essential (primary) hypertension: Secondary | ICD-10-CM | POA: Diagnosis present

## 2017-02-22 DIAGNOSIS — R0602 Shortness of breath: Secondary | ICD-10-CM | POA: Diagnosis not present

## 2017-02-22 DIAGNOSIS — E669 Obesity, unspecified: Secondary | ICD-10-CM | POA: Diagnosis not present

## 2017-02-22 DIAGNOSIS — Z86711 Personal history of pulmonary embolism: Secondary | ICD-10-CM | POA: Insufficient documentation

## 2017-02-22 DIAGNOSIS — Z823 Family history of stroke: Secondary | ICD-10-CM | POA: Insufficient documentation

## 2017-02-22 DIAGNOSIS — E782 Mixed hyperlipidemia: Secondary | ICD-10-CM | POA: Insufficient documentation

## 2017-02-22 DIAGNOSIS — D649 Anemia, unspecified: Secondary | ICD-10-CM | POA: Diagnosis not present

## 2017-02-22 DIAGNOSIS — I4891 Unspecified atrial fibrillation: Secondary | ICD-10-CM | POA: Diagnosis present

## 2017-02-22 LAB — BASIC METABOLIC PANEL
ANION GAP: 6 (ref 5–15)
BUN: 18 mg/dL (ref 6–20)
CALCIUM: 9.3 mg/dL (ref 8.9–10.3)
CO2: 37 mmol/L — ABNORMAL HIGH (ref 22–32)
CREATININE: 0.94 mg/dL (ref 0.44–1.00)
Chloride: 98 mmol/L — ABNORMAL LOW (ref 101–111)
GFR, EST NON AFRICAN AMERICAN: 55 mL/min — AB (ref >60)
Glucose, Bld: 140 mg/dL — ABNORMAL HIGH (ref 65–99)
Potassium: 4.2 mmol/L (ref 3.5–5.1)
SODIUM: 141 mmol/L (ref 135–145)

## 2017-02-22 LAB — CBC
HCT: 28.4 % — ABNORMAL LOW (ref 36.0–46.0)
HEMOGLOBIN: 8.2 g/dL — AB (ref 12.0–15.0)
MCH: 27.2 pg (ref 26.0–34.0)
MCHC: 28.9 g/dL — ABNORMAL LOW (ref 30.0–36.0)
MCV: 94.4 fL (ref 78.0–100.0)
PLATELETS: 198 10*3/uL (ref 150–400)
RBC: 3.01 MIL/uL — AB (ref 3.87–5.11)
RDW: 16.8 % — ABNORMAL HIGH (ref 11.5–15.5)
WBC: 6.2 10*3/uL (ref 4.0–10.5)

## 2017-02-22 LAB — MRSA PCR SCREENING: MRSA by PCR: NEGATIVE

## 2017-02-22 LAB — I-STAT TROPONIN, ED: TROPONIN I, POC: 0.09 ng/mL — AB (ref 0.00–0.08)

## 2017-02-22 LAB — BRAIN NATRIURETIC PEPTIDE: B Natriuretic Peptide: 411.7 pg/mL — ABNORMAL HIGH (ref 0.0–100.0)

## 2017-02-22 LAB — TROPONIN I: TROPONIN I: 0.1 ng/mL — AB (ref <0.03)

## 2017-02-22 MED ORDER — HEPARIN SOD (PORK) LOCK FLUSH 100 UNIT/ML IV SOLN: 250.0000 [IU] | INTRAVENOUS | Status: DC | PRN

## 2017-02-22 MED ORDER — PANTOPRAZOLE SODIUM 40 MG PO TBEC
40.0000 mg | DELAYED_RELEASE_TABLET | Freq: Every day | ORAL | Status: DC
  Administered 2017-02-22 – 2017-02-23 (×2): 40 mg via ORAL
  Filled 2017-02-22 (×2): qty 1

## 2017-02-22 MED ORDER — SODIUM CHLORIDE 0.9% FLUSH: 10.0000 mL | INTRAVENOUS | Status: DC | PRN

## 2017-02-22 MED ORDER — MAGNESIUM OXIDE 400 (241.3 MG) MG PO TABS
400.0000 mg | ORAL_TABLET | Freq: Every day | ORAL | Status: DC
  Administered 2017-02-22 – 2017-02-23 (×2): 400 mg via ORAL
  Filled 2017-02-22 (×2): qty 1

## 2017-02-22 MED ORDER — DOFETILIDE 500 MCG PO CAPS
500.0000 ug | ORAL_CAPSULE | Freq: Two times a day (BID) | ORAL | Status: DC
  Administered 2017-02-22 – 2017-02-23 (×3): 500 ug via ORAL
  Filled 2017-02-22 (×4): qty 1

## 2017-02-22 MED ORDER — ACETAMINOPHEN 325 MG PO TABS: 650.0000 mg | ORAL_TABLET | ORAL | Status: DC | PRN

## 2017-02-22 MED ORDER — FLUTICASONE PROPIONATE 50 MCG/ACT NA SUSP
1.0000 | Freq: Every day | NASAL | Status: DC
  Administered 2017-02-22 – 2017-02-23 (×2): 1 via NASAL
  Filled 2017-02-22 (×2): qty 16

## 2017-02-22 MED ORDER — FUROSEMIDE 10 MG/ML IJ SOLN
40.0000 mg | Freq: Two times a day (BID) | INTRAMUSCULAR | Status: DC
  Administered 2017-02-22 – 2017-02-23 (×3): 40 mg via INTRAVENOUS
  Filled 2017-02-22 (×3): qty 4

## 2017-02-22 MED ORDER — SODIUM CHLORIDE 0.9% FLUSH: 3.0000 mL | INTRAVENOUS | Status: DC | PRN

## 2017-02-22 MED ORDER — LISINOPRIL 2.5 MG PO TABS
2.5000 mg | ORAL_TABLET | Freq: Every day | ORAL | Status: DC
  Administered 2017-02-22: 2.5 mg via ORAL
  Filled 2017-02-22 (×2): qty 1

## 2017-02-22 MED ORDER — SODIUM CHLORIDE 0.9 % IV SOLN: 250.0000 mL | Freq: Once | INTRAVENOUS | Status: DC

## 2017-02-22 MED ORDER — ALBUTEROL SULFATE (2.5 MG/3ML) 0.083% IN NEBU
5.0000 mg | INHALATION_SOLUTION | Freq: Once | RESPIRATORY_TRACT | Status: AC
  Administered 2017-02-22: 5 mg via RESPIRATORY_TRACT
  Filled 2017-02-22: qty 6

## 2017-02-22 MED ORDER — HYDRALAZINE HCL 20 MG/ML IJ SOLN: 10.0000 mg | Freq: Three times a day (TID) | INTRAMUSCULAR | Status: DC | PRN

## 2017-02-22 MED ORDER — MONTELUKAST SODIUM 10 MG PO TABS
10.0000 mg | ORAL_TABLET | Freq: Every day | ORAL | Status: DC
  Administered 2017-02-22: 10 mg via ORAL
  Filled 2017-02-22: qty 1

## 2017-02-22 MED ORDER — ONDANSETRON HCL 4 MG/2ML IJ SOLN: 4.0000 mg | Freq: Four times a day (QID) | INTRAMUSCULAR | Status: DC | PRN

## 2017-02-22 MED ORDER — HEPARIN SOD (PORK) LOCK FLUSH 100 UNIT/ML IV SOLN: 500.0000 [IU] | Freq: Every day | INTRAVENOUS | Status: DC | PRN

## 2017-02-22 MED ORDER — SODIUM CHLORIDE 0.9 % IV SOLN: 250.0000 mL | INTRAVENOUS | Status: DC | PRN

## 2017-02-22 MED ORDER — SODIUM CHLORIDE 0.9% FLUSH
3.0000 mL | Freq: Two times a day (BID) | INTRAVENOUS | Status: DC
  Administered 2017-02-22 – 2017-02-23 (×4): 3 mL via INTRAVENOUS

## 2017-02-22 MED ORDER — PAROXETINE HCL 20 MG PO TABS
20.0000 mg | ORAL_TABLET | Freq: Every day | ORAL | Status: DC
  Administered 2017-02-22: 20 mg via ORAL
  Filled 2017-02-22: qty 1

## 2017-02-22 MED ORDER — APIXABAN 5 MG PO TABS
5.0000 mg | ORAL_TABLET | Freq: Two times a day (BID) | ORAL | Status: DC
  Administered 2017-02-22 – 2017-02-23 (×3): 5 mg via ORAL
  Filled 2017-02-22 (×3): qty 1

## 2017-02-22 MED ORDER — ATORVASTATIN CALCIUM 20 MG PO TABS
20.0000 mg | ORAL_TABLET | Freq: Every day | ORAL | Status: DC
  Administered 2017-02-22 – 2017-02-23 (×2): 20 mg via ORAL
  Filled 2017-02-22 (×2): qty 1

## 2017-02-22 MED ORDER — METOPROLOL TARTRATE 25 MG PO TABS
12.5000 mg | ORAL_TABLET | Freq: Two times a day (BID) | ORAL | Status: DC
  Administered 2017-02-22 – 2017-02-23 (×3): 12.5 mg via ORAL
  Filled 2017-02-22 (×3): qty 1

## 2017-02-22 MED ORDER — IPRATROPIUM BROMIDE 0.02 % IN SOLN
0.5000 mg | Freq: Once | RESPIRATORY_TRACT | Status: AC
  Administered 2017-02-22: 0.5 mg via RESPIRATORY_TRACT
  Filled 2017-02-22: qty 2.5

## 2017-02-22 MED ORDER — IPRATROPIUM-ALBUTEROL 0.5-2.5 (3) MG/3ML IN SOLN
3.0000 mL | Freq: Two times a day (BID) | RESPIRATORY_TRACT | Status: DC
  Administered 2017-02-22 – 2017-02-23 (×3): 3 mL via RESPIRATORY_TRACT
  Filled 2017-02-22 (×3): qty 3

## 2017-02-22 MED ORDER — ORAL CARE MOUTH RINSE
15.0000 mL | Freq: Two times a day (BID) | OROMUCOSAL | Status: DC
  Administered 2017-02-22 – 2017-02-23 (×3): 15 mL via OROMUCOSAL

## 2017-02-22 MED ORDER — GABAPENTIN 300 MG PO CAPS
300.0000 mg | ORAL_CAPSULE | Freq: Three times a day (TID) | ORAL | Status: DC
  Administered 2017-02-22 – 2017-02-23 (×4): 300 mg via ORAL
  Filled 2017-02-22 (×5): qty 1

## 2017-02-22 MED ORDER — BUDESONIDE 0.25 MG/2ML IN SUSP
0.2500 mg | Freq: Two times a day (BID) | RESPIRATORY_TRACT | Status: DC
  Administered 2017-02-22 – 2017-02-23 (×3): 0.25 mg via RESPIRATORY_TRACT
  Filled 2017-02-22 (×3): qty 2

## 2017-02-22 MED ORDER — FERROUS SULFATE 325 (65 FE) MG PO TABS
325.0000 mg | ORAL_TABLET | Freq: Two times a day (BID) | ORAL | Status: DC
  Administered 2017-02-22 – 2017-02-23 (×3): 325 mg via ORAL
  Filled 2017-02-22 (×5): qty 1

## 2017-02-22 MED ORDER — FENOFIBRATE 160 MG PO TABS
160.0000 mg | ORAL_TABLET | Freq: Every day | ORAL | Status: DC
  Administered 2017-02-22 – 2017-02-23 (×2): 160 mg via ORAL
  Filled 2017-02-22 (×2): qty 1

## 2017-02-22 MED ORDER — FUROSEMIDE 10 MG/ML IJ SOLN
40.0000 mg | Freq: Once | INTRAMUSCULAR | Status: AC
  Administered 2017-02-22: 40 mg via INTRAVENOUS
  Filled 2017-02-22: qty 4

## 2017-02-22 NOTE — ED Notes (Signed)
Awaiting medications to be verified

## 2017-02-22 NOTE — ED Notes (Signed)
Pt and family updated on waiting for bed and waiting for morning medications to be verified from pharmacy. Verbalized understanding

## 2017-02-22 NOTE — Care Management Note (Signed)
Case Management Note  Patient Details  Name: Tricia Hernandez MRN: [id number] Date of Birth: [birth year]  Subjective/Objective:                 Patient from home alone. Has HH through [facility]. Lives in [place]. Has had 4 admissions in the past 6 months. Patient discharged to [facility] in May.  PCP [name], CHARLOTTE LUM  Cardiology Dr Lovena Le    Action/Plan:  CM will continue to follow for DC needs  Expected Discharge Date:                  Expected Discharge Plan:  [place]  In-House Referral:     Discharge planning Services  CM Consult  Post Acute Care Choice:    Choice offered to:     DME Arranged:    DME Agency:     HH Arranged:    [place]:  [facility]  Status of Service:  In process, will continue to follow  If discussed at Long Length of Stay Meetings, dates discussed:    Additional Comments:  Carles Collet, RN 02/22/2017, 2:22 PM

## 2017-02-22 NOTE — ED Triage Notes (Addendum)
Pt woke up at 1am with sob and audible wheezing.  Breathing labored on arrival.  Speaking in short phrases.  Pt was suppose to double lasix today and tomorrow.  C/o increased swelling to lower extremities.

## 2017-02-22 NOTE — H&P (Signed)
[facility] History and Physical  [name] OEH:[id number] DOB: Jan 26, 1931 DOA: 02/22/2017  Referring physician:  PCP: Tricia Buffy, NP   Chief Complaint: dyspnea  HPI: Tricia Hernandez is a 83 y.o. female  with past medical history of squamous cell carcinoma, atrial fibrillation, chronic bronchitis, colostomy, GI bleed and lung cancer status post resection presents emergency room with a chief complaint of dyspnea. Patient and family state patient is dry weight is around 249 pounds. Patient is not regularly weighed every track of her fluid. Patient feels that her legs have began to swell at least over the last 2 days more than normal. Patient also short of breath with the cough. Patient denies fever and sputum production. No chills. No recent medication changes.  ED course: Chest x-ray showed infection versus fluid. EDP gave patient dose Lasix. Patient felt this helped with symptoms. Hospitalist consulted for admission.   Review of Systems:  As per HPI otherwise 10 point review of systems negative.    Past Medical History:  Diagnosis Date  . 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 Anemia   . Anxiety   . Atrial fibrillation ([place])   . Basal cell carcinoma of skin of nose   . CHF (congestive heart failure) ([place]) 11/2015  . Chronic bronchitis ([place])   . Chronic depression   . Colostomy in place [facility]) 07/2016  . Diverticular disease   . Fibrocystic disease of breast   . Fracture of shoulder   . GERD (gastroesophageal reflux disease)   . GI bleed 12/10/2016   in setting of no PPI. 5/2 EGD: grade A reflux esophagitis.  Mild, non-hemorrhagic gastritis.  Ablation of 3 Duodenal AVMs  . Herpes zoster   . History of blood transfusion    for vaginal, uterine bleeding leading to hysterectomy.  in 12/2016 transfused x 1 for GI bleed.   Marland Kitchen History of hiatal hernia   . Hyperlipidemia   . Hypertension   . Labyrinthitis   . Lung cancer  ([place])    RML  . Obesity   . Paroxysmal supraventricular tachycardia ([place])   . Peptic ulcer of stomach    hx  . Pneumonia 1980s X 1; 08/2016   "walking; double"  . Pulmonary embolism ([place]) 05/2014   "left lung"  . Vitamin D deficiency    Past Surgical History:  Procedure Laterality Date  . APPENDECTOMY  1959  . BASAL CELL CARCINOMA EXCISION     nose  . BREAST CYST ASPIRATION Right 2016?  Marland Kitchen CATARACT EXTRACTION W/ INTRAOCULAR LENS  IMPLANT, BILATERAL Bilateral 1990s  . CHOLECYSTECTOMY OPEN  1980s  . COLOSTOMY N/A 07/29/2016   Procedure: COLOSTOMY;  Surgeon: Clovis Riley, MD;  Location: [facility];  Service: General;  Laterality: N/A;  . DILATION AND CURETTAGE OF UTERUS    . ESOPHAGOGASTRODUODENOSCOPY N/A 12/11/2016   Procedure: ESOPHAGOGASTRODUODENOSCOPY (EGD);  Surgeon: Jerene Bears, MD;  Location: [facility] ENDOSCOPY;  Service: Endoscopy;  Laterality: N/A;  . EVALUATION UNDER ANESTHESIA WITH HEMORRHOIDECTOMY AND PROCTOSCOPY N/A 07/25/2016   Procedure: EXAM UNDER ANESTHESIA, EXCISION PERIANAL MASS.;  Surgeon: Judeth Horn, MD;  Location: [place];  Service: General;  Laterality: N/A;  Prone position  . LAPAROSCOPIC DIVERTED COLOSTOMY N/A 07/29/2016   Procedure: ATTEMPTED LAPAROSCOPIC ASSISTED COLOSTOMY;  Surgeon: Clovis Riley, MD;  Location: [facility];  Service: General;  Laterality: N/A;  . LUNG REMOVAL, PARTIAL Right 2013   RML mass/carcinoid, Dr Lurena Nida  . TONSILLECTOMY AND  ADENOIDECTOMY  1960s  . VAGINAL HYSTERECTOMY  1959   "partial"   Social History:  reports that she quit smoking about 42 years ago. Her smoking use included Cigarettes. She has a 7.50 pack-year smoking history. She has never used smokeless tobacco. She reports that she does not drink alcohol or use drugs.  Allergies  Allergen Reactions  . Penicillins Anaphylaxis    Reaction:  Unknown  Has patient had a PCN reaction causing immediate rash, facial/tongue/throat swelling, SOB or lightheadedness with hypotension:  Unsure Has patient had a PCN reaction causing severe rash involving mucus membranes or skin necrosis: Unsure Has patient had a PCN reaction that required hospitalization Unsure Has patient had a PCN reaction occurring within the last 10 years: Unsure If all of the above answers are "NO", then may proceed with Cephalosporin use.   [name] [Naproxen Sodium] Hives  . Antihistamines, Chlorpheniramine-Type Rash    Reaction:  Unknown   . Aspirin Rash  . Benadryl [Diphenhydramine Hcl] Palpitations    Increases heart rate  . Codeine Rash  . Hydrochlorothiazide Rash  . Rocephin [Ceftriaxone Sodium In Dextrose] Rash  . Sulfa Antibiotics Rash    Family History  Problem Relation Age of Onset  . Heart attack Mother        Dec 1987  . CVA Mother   . Hypertension Mother   . Multiple myeloma Mother   . Breast cancer Sister   . Skin cancer Sister   . Prostate cancer Brother   . Throat cancer Brother   . Cervical cancer Daughter   . Leukemia Daughter   . Leukemia Son   . Throat cancer Brother   . Cancer Brother        soft palette  . Heart Problems Brother        Pacemaker  . Colon cancer Neg Hx   . Pancreatic cancer Neg Hx      Prior to Admission medications   Medication Sig Start Date End Date Taking? Authorizing Provider  apixaban (ELIQUIS) 5 MG TABS tablet Take 1 tablet (5 mg total) by mouth 2 (two) times daily. 12/02/16  Yes Evans Lance, MD  atorvastatin (LIPITOR) 20 MG tablet Take 1 tablet (20 mg total) by mouth daily. 11/04/16  Yes [name], [name], NP  budesonide (PULMICORT) 0.25 MG/2ML nebulizer solution Take 2 mLs (0.25 mg total) by nebulization 2 (two) times daily. 11/04/16  Yes Mannam, Praveen, MD  Calcium 600-200 MG-UNIT tablet Take 1 tablet by mouth daily.   Yes [provider]  dofetilide (TIKOSYN) 500 MCG capsule Take 1 capsule (500 mcg total) by mouth 2 (two) times daily. 10/10/16  Yes Seiler, Amber K, NP  ENSURE PLUS (ENSURE PLUS) LIQD Take 237 mLs by mouth  daily.    Yes [provider]  ergocalciferol (VITAMIN D2) 50000 units capsule Take 50,000 Units by mouth once a week.   Yes [provider]  fenofibrate 160 MG tablet Take 1 tablet (160 mg total) by mouth daily. 11/04/16  Yes [name], [name], NP  ferrous sulfate 325 (65 FE) MG tablet Take 1 tablet (325 mg total) by mouth daily with breakfast. Patient taking differently: Take 650 mg by mouth daily with breakfast.  11/05/16  Yes [name], [name], NP  fluticasone (FLONASE) 50 MCG/ACT nasal spray Place 1 spray into both nostrils daily.   Yes [provider]  furosemide (LASIX) 40 MG tablet Take 0.5 tablets (20 mg total) by mouth daily. 12/16/16  Yes Domenic Polite, MD  gabapentin (NEURONTIN) 300 MG capsule 300 mg 3 (three) times daily.  02/20/17  Yes [provider]  ipratropium-albuterol (DUONEB) 0.5-2.5 (3) MG/3ML SOLN Take 3 mLs by nebulization 2 (two) times daily. 11/14/16  Yes Mannam, Praveen, MD  magnesium oxide (MAG-OX) 400 (241.3 Mg) MG tablet Take 1 tablet (400 mg total) by mouth daily. 11/12/16  Yes Evans Lance, MD  metoprolol tartrate (LOPRESSOR) 25 MG tablet Take 0.5 tablets (12.5 mg total) by mouth 2 (two) times daily. 01/13/17 04/13/17 Yes Evans Lance, MD  montelukast (SINGULAIR) 10 MG tablet Take 1 tablet (10 mg total) by mouth at bedtime. 11/04/16  Yes [name], [name], NP  pantoprazole (PROTONIX) 40 MG tablet Take 1 tablet (40 mg total) by mouth daily. 12/16/16  Yes Domenic Polite, MD  PARoxetine (PAXIL) 20 MG tablet Take 20 mg by mouth at bedtime.    Yes [provider]  senna-docusate (SENOKOT-S) 8.6-50 MG tablet Take 1 tablet by mouth at bedtime as needed for mild constipation. 12/15/16  Yes Domenic Polite, MD  Ostomy Supplies KIT [phone] is number on bag one piece unit. Colostomy bag and adhesive and alcohol prep, no sting, cannot use regular prep pad. 12/20/16   [name], [name], NP   Physical Exam: Vitals:   02/22/17 0630 02/22/17  0645 02/22/17 0700 02/22/17 0715  BP: 109/61 (!) 122/51 123/85 (!) 135/58  Pulse: 67 70 65 64  Resp: (!) 22 18 (!) 22 (!) 22  Temp:      TempSrc:      SpO2: 93% 97% 95% 95%    Wt Readings from Last 3 Encounters:  02/21/17 122 kg (269 lb)  12/31/16 112.9 kg (248 lb 12.8 oz)  12/14/16 111.8 kg (246 lb 7.6 oz)    General:  Appears calm and comfortable; A&Ox3 Eyes:  PERRL, EOMI, normal lids, iris ENT:  grossly normal hearing, lips & tongue Neck:  no LAD, masses or thyromegaly Cardiovascular:  Irr, RR, no m/r/g. LE edema. 2+ Respiratory:  CTA bilaterally, no w/r/r. Normal respiratory effort. Abdomen:  soft, ntnd Skin:  no rash or induration seen on limited exam Musculoskeletal:  grossly normal tone BUE/BLE Psychiatric:  grossly normal mood and affect, speech fluent and appropriate Neurologic:  CN 2-12 grossly intact, moves all extremities in coordinated fashion.          Labs on Admission:  Basic Metabolic Panel:  Recent Labs Lab 02/21/17 1203 02/22/17 0454  NA 143 141  K 4.1 4.2  CL 98 98*  CO2 38* 37*  GLUCOSE 97 140*  BUN 22 18  CREATININE 1.00 0.94  CALCIUM 9.2 9.3   Liver Function Tests: No results for input(s): AST, ALT, ALKPHOS, BILITOT, PROT, ALBUMIN in the last 168 hours. No results for input(s): LIPASE, AMYLASE in the last 168 hours. No results for input(s): AMMONIA in the last 168 hours. CBC:  Recent Labs Lab 02/21/17 1203 02/22/17 0454  WBC 5.6 6.2  NEUTROABS 4.3  --   HGB 8.2 Repeated and verified X2.* 8.2*  HCT 25.1* 28.4*  MCV 87.9 94.4  PLT 179.0 198   Cardiac Enzymes:  Recent Labs Lab 02/22/17 0529  TROPONINI 0.10*    BNP (last 3 results)  Recent Labs  08/25/16 2340 12/10/16 1005 02/22/17 0542  BNP 298.0* 328.7* 411.7*    ProBNP (last 3 results)  Recent Labs  02/21/17 1203  PROBNP 354.0*     Serum creatinine: 0.94 mg/dL 02/22/17 0454 Estimated creatinine clearance: 58.4 mL/min  CBG: No results for  input(s):  GLUCAP in the last 168 hours.  Radiological Exams on Admission: Dg Chest Portable 1 View  Result Date: 02/22/2017 CLINICAL DATA:  83 year old female with shortness of breath. History of anal cancer and radiation treatment. Prior right middle lobe surgery for resection of mass/carcinoid. EXAM: PORTABLE CHEST 1 VIEW COMPARISON:  Chest radiograph dated 12/10/2016 and CT dated 08/27/2016 FINDINGS: Postsurgical changes in the right mid lung field. There diffuse bilateral interstitial prominence and hazy airspace densities predominantly involving the mid to lower lung fields. Overall degree of airspace densities have increased compared to the radiograph of 12/10/2016. Small pleural effusions may be present. There is no pneumothorax. Stable cardiac silhouette. No acute osseous pathology. IMPRESSION: Postsurgical changes of the right mid lung field. Bilateral mid to lower lung field interstitial prominence and airspace densities with interval progression compared to the prior radiograph noted concerning for an infectious process. Clinical correlation is recommended. Electronically Signed   By: Anner Crete M.D.   On: 02/22/2017 05:52    EKG: Independently reviewed. No STEMI  Assessment/Plan Principal Problem:   Dyspnea Active Problems:   Atrial fibrillation (HCC), CHA2DS2-VASc Score 5   Anemia   Acid reflux  Acute heart failure Serial troponin, pos but at baseline Diuresis Fluid seen on cxr Echo tomorrow Ins and outs Continuous pulse ox I&Os CXR in AM Start ACEi  Afib Continue Lopressor, Eliquis, tikosyn  Anemia Chronic stable Continue iron tablets  Reflux Continue Protonix  Chronic pain Cont gabapentin  Ostomy Continue bag changes  Hyperlipidemia Continue statin, fenofibrate  Chronic bronchitis Continue duo nebs, Pulmicort, singulair  Anxiety No SI/HI Cont paxil  Code Status: full  DVT Prophylaxis: eliquis Family Communication: at bedside Disposition Plan:  Pending Improvement  Status: obs tele  Elwin Mocha, MD Family Medicine [facility] [email or web address] Password [email or web address]

## 2017-02-22 NOTE — ED Provider Notes (Signed)
[facility] DEPT Provider Note   CSN: [id number] Arrival date & time: 02/22/17  0445     History   Chief Complaint Chief Complaint  Patient presents with  . Shortness of Breath    HPI Tricia Hernandez is a 83 y.o. female.  HPI  83 year old female with a history of GI bleeding, atrial fibrillation, CHF, and chronic bronchitis presents with shortness of breath. She has been in rehabilitation for the last 9 weeks after being admitted in May 2018. She got out of rehabilitation 2 days ago. She states she's been somewhat short of breath recently but cannot exactly clarify for how long. However tonight at 1 AM she woke up with worsening shortness of breath and it has been continuing for the last 5 hours. She denies any cough. She states she's had some allergies for the last couple days including sneezing and rhinorrhea. No fevers. For the last 3 weeks she has had progressive bilateral lower extremity edema. She saw her gastroenterologist yesterday and was told to increase her Lasix from 20 mg daily to 20 mg twice per day. She did not try her albuterol this morning. She states that she is chronically on 2 L of oxygen over the last 3 weeks. She has gotten a blood transfusion earlier this week as well as last week. She is due for a capsule endoscopy soon to determine the source of bleeding.  Past Medical History:  Diagnosis Date  . 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 Anemia   . Anxiety   . Atrial fibrillation ([place])   . Basal cell carcinoma of skin of nose   . CHF (congestive heart failure) ([place]) 11/2015  . Chronic bronchitis ([place])   . Chronic depression   . Colostomy in place [facility]) 07/2016  . Diverticular disease   . Fibrocystic disease of breast   . Fracture of shoulder   . GERD (gastroesophageal reflux disease)   . GI bleed 12/10/2016   in setting of no PPI. 5/2 EGD: grade A reflux esophagitis.  Mild, non-hemorrhagic gastritis.  Ablation  of 3 Duodenal AVMs  . Herpes zoster   . History of blood transfusion    for vaginal, uterine bleeding leading to hysterectomy.  in 12/2016 transfused x 1 for GI bleed.   Marland Kitchen History of hiatal hernia   . Hyperlipidemia   . Hypertension   . Labyrinthitis   . Lung cancer ([place])    RML  . Obesity   . Paroxysmal supraventricular tachycardia ([place])   . Peptic ulcer of stomach    hx  . Pneumonia 1980s X 1; 08/2016   "walking; double"  . Pulmonary embolism ([place]) 05/2014   "left lung"  . Vitamin D deficiency     Patient Active Problem List   Diagnosis Date Noted  . Colostomy care ([place]) 12/20/2016  . Angiodysplasia of duodenum   . GI bleeding 12/10/2016  . Heme positive stool 12/10/2016  . Mixed hyperlipidemia 11/05/2016  . Chronic bronchitis ([place]) 11/04/2016  . Persistent atrial fibrillation ([place]) 10/07/2016  . HCAP (healthcare-associated pneumonia) 08/26/2016  . Acute respiratory failure with hypoxia ([place]) 08/26/2016  . Essential hypertension   . Lung nodule   . Palliative care encounter   . Dyspnea 07/23/2016  . SOB (shortness of breath) 07/22/2016  . Symptomatic anemia 07/22/2016  . Anal cancer ([place]) 07/22/2016  . BRBPR (bright red blood per rectum) 07/22/2016  . Elevated troponin 07/22/2016  . Atrial fibrillation ([place]), CHA2DS2-VASc  Score 5 07/22/2016  . Pulmonary embolus ([place])   . Obesity   . Lung cancer ([place])   . Hypertension     Past Surgical History:  Procedure Laterality Date  . APPENDECTOMY  1959  . BASAL CELL CARCINOMA EXCISION     nose  . BREAST CYST ASPIRATION Right 2016?  Marland Kitchen CATARACT EXTRACTION W/ INTRAOCULAR LENS  IMPLANT, BILATERAL Bilateral 1990s  . CHOLECYSTECTOMY OPEN  1980s  . COLOSTOMY N/A 07/29/2016   Procedure: COLOSTOMY;  Surgeon: Clovis Riley, MD;  Location: [facility];  Service: General;  Laterality: N/A;  . DILATION AND CURETTAGE OF UTERUS    . ESOPHAGOGASTRODUODENOSCOPY N/A 12/11/2016   Procedure: ESOPHAGOGASTRODUODENOSCOPY (EGD);  Surgeon: Jerene Bears, MD;  Location[facility] END[facility]COPY;  Service: Endoscopy;  Laterality: N/A;  . EVALUATION UNDER ANESTHESIA WITH HEMORRHOIDECTOMY AND PROCTOSCOPY N/A 07/25/2016   Procedure: EXAM UNDER ANESTHESIA, EXCISION PERIANAL MASS.;  Surgeon: Judeth Horn, MD;  Location: [facility];  Service: General;  Laterality: N/A;  Prone position  . LAPAROSCOPIC DIVERTED COLOSTOMY N/A 07/29/2016   Procedure: ATTEMPTED LAPAROSCOPIC ASSISTED COLOSTOMY;  Surgeon: Clovis Riley, MD;  Location: [facility];  Service: General;  Laterality: N/A;  . LUNG REMOVAL, PARTIAL Right 2013   RML mass/carcinoid, Dr Lurena Nida  . TONSILLECTOMY AND ADENOIDECTOMY  1960s  . VAGINAL HYSTERECTOMY  1959   "partial"    OB History    No data available       Home Medications    Prior to Admission medications   Medication Sig Start Date End Date Taking? Authorizing Provider  apixaban (ELIQUIS) 5 MG TABS tablet Take 1 tablet (5 mg total) by mouth 2 (two) times daily. 12/02/16  Yes Evans Lance, MD  atorvastatin (LIPITOR) 20 MG tablet Take 1 tablet (20 mg total) by mouth daily. 11/04/16  Yes [name], [name], NP  budesonide (PULMICORT) 0.25 MG/2ML nebulizer solution Take 2 mLs (0.25 mg total) by nebulization 2 (two) times daily. 11/04/16  Yes Mannam, Praveen, MD  Calcium 600-200 MG-UNIT tablet Take 1 tablet by mouth daily.   Yes [provider]  dofetilide (TIKOSYN) 500 MCG capsule Take 1 capsule (500 mcg total) by mouth 2 (two) times daily. 10/10/16  Yes Seiler, Amber K, NP  ENSURE PLUS (ENSURE PLUS) LIQD Take 237 mLs by mouth daily.    Yes [provider]  ergocalciferol (VITAMIN D2) 50000 units capsule Take 50,000 Units by mouth once a week.   Yes [provider]  fenofibrate 160 MG tablet Take 1 tablet (160 mg total) by mouth daily. 11/04/16  Yes [name], [name], NP  ferrous sulfate 325 (65 FE) MG tablet Take 1 tablet (325 mg total) by mouth daily with breakfast. Patient taking differently: Take 650 mg by mouth  daily with breakfast.  11/05/16  Yes [name], [name], NP  fluticasone (FLONASE) 50 MCG/ACT nasal spray Place 1 spray into both nostrils daily.   Yes [provider]  furosemide (LASIX) 40 MG tablet Take 0.5 tablets (20 mg total) by mouth daily. 12/16/16  Yes Domenic Polite, MD  gabapentin (NEURONTIN) 300 MG capsule 300 mg 3 (three) times daily.  02/20/17  Yes [provider]  ipratropium-albuterol (DUONEB) 0.5-2.5 (3) MG/3ML SOLN Take 3 mLs by nebulization 2 (two) times daily. 11/14/16  Yes Mannam, Praveen, MD  magnesium oxide (MAG-OX) 400 (241.3 Mg) MG tablet Take 1 tablet (400 mg total) by mouth daily. 11/12/16  Yes Evans Lance, MD  metoprolol tartrate (LOPRESSOR) 25 MG tablet Take 0.5  tablets (12.5 mg total) by mouth 2 (two) times daily. 01/13/17 04/13/17 Yes Evans Lance, MD  montelukast (SINGULAIR) 10 MG tablet Take 1 tablet (10 mg total) by mouth at bedtime. 11/04/16  Yes [name], [name], NP  pantoprazole (PROTONIX) 40 MG tablet Take 1 tablet (40 mg total) by mouth daily. 12/16/16  Yes Domenic Polite, MD  PARoxetine (PAXIL) 20 MG tablet Take 20 mg by mouth at bedtime.    Yes [provider]  senna-docusate (SENOKOT-S) 8.6-50 MG tablet Take 1 tablet by mouth at bedtime as needed for mild constipation. 12/15/16  Yes Domenic Polite, MD  Ostomy Supplies KIT [phone] is number on bag one piece unit. Colostomy bag and adhesive and alcohol prep, no sting, cannot use regular prep pad. 12/20/16   [name], [name], NP    Family History Family History  Problem Relation Age of Onset  . Heart attack Mother        Dec 1987  . CVA Mother   . Hypertension Mother   . Multiple myeloma Mother   . Breast cancer Sister   . Skin cancer Sister   . Prostate cancer Brother   . Throat cancer Brother   . Cervical cancer Daughter   . Leukemia Daughter   . Leukemia Son   . Throat cancer Brother   . Cancer Brother        soft palette  . Heart Problems Brother        Pacemaker  .  Colon cancer Neg Hx   . Pancreatic cancer Neg Hx     Social History Social History  Substance Use Topics  . Smoking status: Former Smoker    Packs/day: 0.50    Years: 15.00    Types: Cigarettes    Quit date: 1976  . Smokeless tobacco: Never Used  . Alcohol use No     Allergies   Penicillins; Aleve [naproxen sodium]; Antihistamines, chlorpheniramine-type; Aspirin; Benadryl [diphenhydramine hcl]; Codeine; Hydrochlorothiazide; Rocephin [ceftriaxone sodium in dextrose]; and Sulfa antibiotics   Review of Systems Review of Systems  Constitutional: Negative for fever.  Respiratory: Positive for shortness of breath.   Cardiovascular: Positive for chest pain and leg swelling.  Gastrointestinal: Positive for blood in stool (dark stool). Negative for abdominal pain and vomiting.  All other systems reviewed and are negative.    Physical Exam Updated Vital Signs BP (!) 149/59   Pulse 63   Temp 98.4 F (36.9 C) (Oral)   Resp (!) 24   SpO2 96%   Physical Exam  Constitutional: She is oriented to person, place, and time. She appears well-developed and well-nourished.  HENT:  Head: Normocephalic and atraumatic.  Right Ear: External ear normal.  Left Ear: External ear normal.  Nose: Nose normal.  Eyes: Right eye exhibits no discharge. Left eye exhibits no discharge.  Cardiovascular: Normal rate, regular rhythm and normal heart sounds.   Pulmonary/Chest: Effort normal. No accessory muscle usage. Tachypnea noted. She has decreased breath sounds. She has wheezes (mild).  Abdominal: Soft. There is no tenderness.  Musculoskeletal: She exhibits edema (BLE pitting edema).  Neurological: She is alert and oriented to person, place, and time.  Skin: Skin is warm and dry. She is not diaphoretic.  Nursing note and vitals reviewed.    ED Treatments / Results  Labs (all labs ordered are listed, but only abnormal results are displayed) Labs Reviewed  BASIC METABOLIC PANEL - Abnormal;  Notable for the following:       Result Value  Chloride 98 (*)    CO2 37 (*)    Glucose, Bld 140 (*)    GFR calc non Af Amer 55 (*)    All other components within normal limits  CBC - Abnormal; Notable for the following:    RBC 3.01 (*)    Hemoglobin 8.2 (*)    HCT 28.4 (*)    MCHC 28.9 (*)    RDW 16.8 (*)    All other components within normal limits  TROPONIN I - Abnormal; Notable for the following:    Troponin I 0.10 (*)    All other components within normal limits  BRAIN NATRIURETIC PEPTIDE - Abnormal; Notable for the following:    B Natriuretic Peptide 411.7 (*)    All other components within normal limits  I-STAT TROPOININ, ED - Abnormal; Notable for the following:    Troponin i, poc 0.09 (*)    All other components within normal limits    EKG  EKG Interpretation  Date/Time:  Saturday February 22 2017 05:05:02 EDT Ventricular Rate:  63 PR Interval:  148 QRS Duration: 70 QT Interval:  462 QTC Calculation: 472 R Axis:   58 Text Interpretation:  Normal sinus rhythm with sinus arrhythmia nonspecific ST changes, similar to May 2018 Confirmed by Sherwood Gambler [phone]) on 02/22/2017 5:48:33 AM       Radiology Dg Chest Portable 1 View  Result Date: 02/22/2017 CLINICAL DATA:  83 year old female with shortness of breath. History of anal cancer and radiation treatment. Prior right middle lobe surgery for resection of mass/carcinoid. EXAM: PORTABLE CHEST 1 VIEW COMPARISON:  Chest radiograph dated 12/10/2016 and CT dated 08/27/2016 FINDINGS: Postsurgical changes in the right mid lung field. There diffuse bilateral interstitial prominence and hazy airspace densities predominantly involving the mid to lower lung fields. Overall degree of airspace densities have increased compared to the radiograph of 12/10/2016. Small pleural effusions may be present. There is no pneumothorax. Stable cardiac silhouette. No acute osseous pathology. IMPRESSION: Postsurgical changes of the right mid lung  field. Bilateral mid to lower lung field interstitial prominence and airspace densities with interval progression compared to the prior radiograph noted concerning for an infectious process. Clinical correlation is recommended. Electronically Signed   By: Anner Crete M.D.   On: 02/22/2017 05:52    Procedures Procedures (including critical care time)  Medications Ordered in ED Medications  albuterol (PROVENTIL) (2.5 MG/3ML) 0.083% nebulizer solution 5 mg (5 mg Nebulization Given 02/22/17 0556)  ipratropium (ATROVENT) nebulizer solution 0.5 mg (0.5 mg Nebulization Given 02/22/17 0556)  furosemide (LASIX) injection 40 mg (40 mg Intravenous Given 02/22/17 0711)     Initial Impression / Assessment and Plan / ED Course  I have reviewed the triage vital signs and the nursing notes.  Pertinent labs & imaging results that were available during my care of the patient were reviewed by me and considered in my medical decision making (see chart for details).     Patient appears better after being given albuterol. Overall her picture appears to be a mix between COPD and CHF. She will be given IV Lasix given elevated BNP, likely edema and her chest x-ray. I do not think this represents pneumonia given no cough, fever, and normal white blood cell count. Her mildly elevated troponin is similar to the past and likely from strain from CHF. Admit to the hospitalist. She is stable on her now chronic 2 L oxygen requirement.  Final Clinical Impressions(s) / ED Diagnoses   Final diagnoses:  Acute on  chronic congestive heart failure, unspecified heart failure type [facility])    New Prescriptions New Prescriptions   No medications on file     Sherwood Gambler, MD 02/22/17 [phone]

## 2017-02-23 ENCOUNTER — Observation Stay (HOSPITAL_COMMUNITY): Payer: Medicare Other

## 2017-02-23 DIAGNOSIS — J9601 Acute respiratory failure with hypoxia: Principal | ICD-10-CM

## 2017-02-23 DIAGNOSIS — I4891 Unspecified atrial fibrillation: Secondary | ICD-10-CM | POA: Diagnosis not present

## 2017-02-23 DIAGNOSIS — I878 Other specified disorders of veins: Secondary | ICD-10-CM | POA: Diagnosis not present

## 2017-02-23 DIAGNOSIS — R05 Cough: Secondary | ICD-10-CM | POA: Diagnosis not present

## 2017-02-23 DIAGNOSIS — R0602 Shortness of breath: Secondary | ICD-10-CM | POA: Diagnosis not present

## 2017-02-23 DIAGNOSIS — I10 Essential (primary) hypertension: Secondary | ICD-10-CM | POA: Diagnosis not present

## 2017-02-23 DIAGNOSIS — I872 Venous insufficiency (chronic) (peripheral): Secondary | ICD-10-CM

## 2017-02-23 LAB — BASIC METABOLIC PANEL
ANION GAP: 6 (ref 5–15)
BUN: 19 mg/dL (ref 6–20)
CALCIUM: 8.7 mg/dL — AB (ref 8.9–10.3)
CO2: 40 mmol/L — ABNORMAL HIGH (ref 22–32)
Chloride: 95 mmol/L — ABNORMAL LOW (ref 101–111)
Creatinine, Ser: 1.11 mg/dL — ABNORMAL HIGH (ref 0.44–1.00)
GFR calc non Af Amer: 45 mL/min — ABNORMAL LOW (ref >60)
GFR, EST AFRICAN AMERICAN: 52 mL/min — AB (ref >60)
Glucose, Bld: 108 mg/dL — ABNORMAL HIGH (ref 65–99)
Potassium: 3.6 mmol/L (ref 3.5–5.1)
Sodium: 141 mmol/L (ref 135–145)

## 2017-02-23 MED ORDER — FUROSEMIDE 40 MG PO TABS: 40.0000 mg | ORAL_TABLET | Freq: Every day | ORAL | 0 refills | Status: DC

## 2017-02-23 NOTE — Discharge Instructions (Signed)
Turn oxygen up to 4 L when walking. Take to PCP and check O2 in 1 wk.   Please take all your medications with you for your next visit with your Primary MD. Please request your Primary MD to go over all hospital test results at the follow up. Please ask your Primary MD to get all Hospital records sent to his/her office.  If you experience worsening of your admission symptoms, develop shortness of breath, chest pain, suicidal or homicidal thoughts or a life threatening emergency, you must seek medical attention immediately by calling 911 or calling your MD.  Dennis Bast must read the complete instructions/literature along with all the possible adverse reactions/side effects for all the medicines you take including new medications that have been prescribed to you. Take new medicines after you have completely understood and accpet all the possible adverse reactions/side effects.   Do not drive when taking pain medications or sedatives.    Do not take more than prescribed Pain, Sleep and Anxiety Medications  If you have smoked or chewed Tobacco in the last 2 yrs please stop. Stop any regular alcohol and or recreational drug use.  Wear Seat belts while driving.

## 2017-02-23 NOTE — Progress Notes (Signed)
Patient discharge teaching given, including activity, diet, follow-up appoints, and medications. Patient verbalized understanding of all discharge instructions. IV access was d/c'd. Vitals are stable. Skin is intact except as charted in most recent assessments. Pt to be escorted out by NT, to be driven home by family. 

## 2017-02-23 NOTE — Discharge Summary (Signed)
Physician Discharge Summary  Tricia Hernandez ZOX:[id number] DOB: September 05, 1930 DOA: 02/22/2017  PCP: Flossie Buffy, NP  Admit date: 02/22/2017 Discharge date: 02/23/2017  Admitted From: home Disposition:  home   Recommendations for Outpatient Follow-up:  1. TEDS for pedal edema 2. Needs to have pulse ox checked in PCPs office in 1 wk  Discharge Condition:  stable   CODE STATUS:  Full code   Consultations:  none    Discharge Diagnoses:  Principal Problem:   Acute respiratory failure with hypoxia ([place]) Active Problems: Pedal edema due to venous stasis   Obesity   Hypertension   Anal cancer ([place])   BRBPR (bright red blood per rectum)   Atrial fibrillation (HCC), CHA2DS2-VASc Score 5   Anemia   Acid reflux      Subjective: No cough, congestion, dyspnea. No other complaints.   Brief Summary: Tricia Hernandez is a 83 y.o. female  with past medical history of squamous cell carcinoma, atrial fibrillation, chronic bronchitis, colostomy, GI bleed and lung cancer status post resection presents emergency room with a chief complaint of dyspnea and increasing pedal edema.  Hospital Course:  Acute hypoxic resp failure - has CXR consistent with b/l atelectasis - has been given IV Lasix and has diuresed well - ambulated down the hall twice today- I walked with her the second time where she walked from the end of the hall to the nursing station and back while talking- she will need 4 L O2 when ambulation-- have added incentive spirometry-  - can use 40 mg of Lasix for next 3 days and then go back to 20 mg daily as she was taking before  Venous stasis edema - has been siting in a chair all day at home and SNF - TEDS ordered- advised to keep elevated   All other medical problems stable- no changes in medications made today  Discharge Instructions  Discharge Instructions    Diet - low sodium heart healthy    Complete by:  As directed    Increase activity slowly    Complete by:  As  directed      Allergies as of 02/23/2017      Reactions   Penicillins Anaphylaxis   Reaction:  Unknown  Has patient had a PCN reaction causing immediate rash, facial/tongue/throat swelling, SOB or lightheadedness with hypotension: Unsure Has patient had a PCN reaction causing severe rash involving mucus membranes or skin necrosis: Unsure Has patient had a PCN reaction that required hospitalization Unsure Has patient had a PCN reaction occurring within the last 10 years: Unsure If all of the above answers are "NO", then may proceed with Cephalosporin use.   Aleve [naproxen Sodium] Hives   Antihistamines, Chlorpheniramine-type Rash   Reaction:  Unknown    Aspirin Rash   Benadryl [diphenhydramine Hcl] Palpitations   Increases heart rate   Codeine Rash   Hydrochlorothiazide Rash   Rocephin [ceftriaxone Sodium In Dextrose] Rash   Sulfa Antibiotics Rash      Medication List    TAKE these medications   apixaban 5 MG Tabs tablet Commonly known as:  ELIQUIS Take 1 tablet (5 mg total) by mouth 2 (two) times daily.   atorvastatin 20 MG tablet Commonly known as:  LIPITOR Take 1 tablet (20 mg total) by mouth daily.   budesonide 0.25 MG/2ML nebulizer solution Commonly known as:  PULMICORT Take 2 mLs (0.25 mg total) by nebulization 2 (two) times daily.   Calcium 600-200 MG-UNIT tablet Take 1 tablet by mouth daily.  dofetilide 500 MCG capsule Commonly known as:  TIKOSYN Take 1 capsule (500 mcg total) by mouth 2 (two) times daily.   ENSURE PLUS Liqd Take 237 mLs by mouth daily.   ergocalciferol 50000 units capsule Commonly known as:  VITAMIN D2 Take 50,000 Units by mouth once a week.   fenofibrate 160 MG tablet Take 1 tablet (160 mg total) by mouth daily.   ferrous sulfate 325 (65 FE) MG tablet Take 1 tablet (325 mg total) by mouth daily with breakfast. What changed:  how much to take   fluticasone 50 MCG/ACT nasal spray Commonly known as:  FLONASE Place 1 spray into  both nostrils daily.   furosemide 40 MG tablet Commonly known as:  LASIX Take 1 tablet (40 mg total) by mouth daily. For 3 days and then back to 20 mg daily What changed:  how much to take  additional instructions   gabapentin 300 MG capsule Commonly known as:  NEURONTIN 300 mg 3 (three) times daily.   ipratropium-albuterol 0.5-2.5 (3) MG/3ML Soln Commonly known as:  DUONEB Take 3 mLs by nebulization 2 (two) times daily.   magnesium oxide 400 (241.3 Mg) MG tablet Commonly known as:  MAG-OX Take 1 tablet (400 mg total) by mouth daily.   metoprolol tartrate 25 MG tablet Commonly known as:  LOPRESSOR Take 0.5 tablets (12.5 mg total) by mouth 2 (two) times daily.   montelukast 10 MG tablet Commonly known as:  SINGULAIR Take 1 tablet (10 mg total) by mouth at bedtime.   Ostomy Supplies Kit [id number] is number on bag one piece unit. Colostomy bag and adhesive and alcohol prep, no sting, cannot use regular prep pad.   pantoprazole 40 MG tablet Commonly known as:  PROTONIX Take 1 tablet (40 mg total) by mouth daily.   PARoxetine 20 MG tablet Commonly known as:  PAXIL Take 20 mg by mouth at bedtime.   senna-docusate 8.6-50 MG tablet Commonly known as:  Senokot-S Take 1 tablet by mouth at bedtime as needed for mild constipation.       Allergies  Allergen Reactions  . Penicillins Anaphylaxis    Reaction:  Unknown  Has patient had a PCN reaction causing immediate rash, facial/tongue/throat swelling, SOB or lightheadedness with hypotension: Unsure Has patient had a PCN reaction causing severe rash involving mucus membranes or skin necrosis: Unsure Has patient had a PCN reaction that required hospitalization Unsure Has patient had a PCN reaction occurring within the last 10 years: Unsure If all of the above answers are "NO", then may proceed with Cephalosporin use.   [name] [Naproxen Sodium] Hives  . Antihistamines, Chlorpheniramine-Type Rash    Reaction:  Unknown   .  Aspirin Rash  . Benadryl [Diphenhydramine Hcl] Palpitations    Increases heart rate  . Codeine Rash  . Hydrochlorothiazide Rash  . Rocephin [Ceftriaxone Sodium In Dextrose] Rash  . Sulfa Antibiotics Rash     Procedures/Studies:   Dg Chest 2 View  Result Date: 02/23/2017 CLINICAL DATA:  Shortness of Breath EXAM: CHEST  2 VIEW COMPARISON:  Cough 02/22/2017 FINDINGS: Cardiomegaly. Vascular congestion. Postsurgical changes in the right mid lung. Areas of atelectasis or scarring in the right mid and lower lung. No confluent opacity on the left. Small right effusion. IMPRESSION: Areas of right mid and lower lung atelectasis or scarring with postoperative changes on the right. Small right effusion. Cardiomegaly, vascular congestion. Electronically Signed   By: Rolm Baptise M.D.   On: 02/23/2017 07:50   Dg Chest Portable  1 View  Result Date: 02/22/2017 CLINICAL DATA:  83 year old female with shortness of breath. History of anal cancer and radiation treatment. Prior right middle lobe surgery for resection of mass/carcinoid. EXAM: PORTABLE CHEST 1 VIEW COMPARISON:  Chest radiograph dated 12/10/2016 and CT dated 08/27/2016 FINDINGS: Postsurgical changes in the right mid lung field. There diffuse bilateral interstitial prominence and hazy airspace densities predominantly involving the mid to lower lung fields. Overall degree of airspace densities have increased compared to the radiograph of 12/10/2016. Small pleural effusions may be present. There is no pneumothorax. Stable cardiac silhouette. No acute osseous pathology. IMPRESSION: Postsurgical changes of the right mid lung field. Bilateral mid to lower lung field interstitial prominence and airspace densities with interval progression compared to the prior radiograph noted concerning for an infectious process. Clinical correlation is recommended. Electronically Signed   By: Anner Crete M.D.   On: 02/22/2017 05:52       Discharge Exam: Vitals:    02/22/17 2154 02/23/17 0452  BP: (!) 106/43 (!) 111/51  Pulse: 63 (!) 57  Resp: 17 17  Temp: 98 F (36.7 C) 98 F (36.7 C)   Vitals:   02/22/17 2024 02/22/17 2154 02/23/17 0452 02/23/17 0733  BP:  (!) 106/43 (!) 111/51   Pulse:  63 (!) 57   Resp:  17 17   Temp:  98 F (36.7 C) 98 F (36.7 C)   TempSrc:      SpO2: 98% 96% 98% 92%  Weight:   117.5 kg (259 lb 0.7 oz)   Height:        General: Pt is alert, awake, not in acute distress Cardiovascular: RRR, S1/S2 +, no rubs, no gallops Respiratory: CTA bilaterally, no wheezing, no rhonchi Abdominal: Soft, NT, ND, bowel sounds + Extremities: no edema, no cyanosis    The results of significant diagnostics from this hospitalization (including imaging, microbiology, ancillary and laboratory) are listed below for reference.     Microbiology: Recent Results (from the past 240 hour(s))  MRSA PCR Screening     Status: None   Collection Time: 02/22/17 12:44 PM  Result Value Ref Range Status   MRSA by PCR NEGATIVE NEGATIVE Final    Comment:        The GeneXpert MRSA Assay (FDA approved for NASAL specimens only), is one component of a comprehensive MRSA colonization surveillance program. It is not intended to diagnose MRSA infection nor to guide or monitor treatment for MRSA infections.      Labs: BNP (last 3 results)  Recent Labs  08/25/16 2340 12/10/16 1005 02/22/17 0542  BNP 298.0* 328.7* 021.1*   Basic Metabolic Panel:  Recent Labs Lab 02/21/17 1203 02/22/17 0454 02/23/17 0354  NA 143 141 141  K 4.1 4.2 3.6  CL 98 98* 95*  CO2 38* 37* 40*  GLUCOSE 97 140* 108*  BUN _0 CREATININE 1.00 0.94 1.11*  CALCIUM 9.2 9.3 8.7*   Liver Function Tests: No results for input(s): AST, ALT, ALKPHOS, BILITOT, PROT, ALBUMIN in the last 168 hours. No results for input(s): LIPASE, AMYLASE in the last 168 hours. No results for input(s): AMMONIA in the last 168 hours. CBC:  Recent Labs Lab 02/21/17 1203  02/22/17 0454  WBC 5.6 6.2  NEUTROABS 4.3  --   HGB 8.2 Repeated and verified X2.* 8.2*  HCT 25.1* 28.4*  MCV 87.9 94.4  PLT 179.0 198   Cardiac Enzymes:  Recent Labs Lab 02/22/17 0529  TROPONINI 0.10*   BNP: Invalid  input(s): POCBNP CBG: No results for input(s): GLUCAP in the last 168 hours. D-Dimer No results for input(s): DDIMER in the last 72 hours. Hgb A1c No results for input(s): HGBA1C in the last 72 hours. Lipid Profile No results for input(s): CHOL, HDL, LDLCALC, TRIG, CHOLHDL, LDLDIRECT in the last 72 hours. Thyroid function studies No results for input(s): TSH, T4TOTAL, T3FREE, THYROIDAB in the last 72 hours.  Invalid input(s): FREET3 Anemia work up  Recent Labs  02/21/17 1203  FERRITIN 39.5   Urinalysis    Component Value Date/Time   COLORURINE COLORLESS (A) 12/11/2016 1750   APPEARANCEUR CLEAR 12/11/2016 1750   LABSPEC 1.004 (L) 12/11/2016 1750   LABSPEC 1.025 09/23/2016 1531   PHURINE 6.0 12/11/2016 1750   GLUCOSEU NEGATIVE 12/11/2016 1750   GLUCOSEU Negative 09/23/2016 1531   HGBUR NEGATIVE 12/11/2016 1750   BILIRUBINUR NEGATIVE 12/11/2016 1750   BILIRUBINUR Negative 09/23/2016 1531   KETONESUR NEGATIVE 12/11/2016 1750   PROTEINUR NEGATIVE 12/11/2016 1750   UROBILINOGEN 0.2 09/23/2016 1531   NITRITE NEGATIVE 12/11/2016 1750   LEUKOCYTESUR LARGE (A) 12/11/2016 1750   LEUKOCYTESUR Moderate 09/23/2016 1531   Sepsis Labs Invalid input(s): PROCALCITONIN,  WBC,  LACTICIDVEN Microbiology Recent Results (from the past 240 hour(s))  MRSA PCR Screening     Status: None   Collection Time: 02/22/17 12:44 PM  Result Value Ref Range Status   MRSA by PCR NEGATIVE NEGATIVE Final    Comment:        The GeneXpert MRSA Assay (FDA approved for NASAL specimens only), is one component of a comprehensive MRSA colonization surveillance program. It is not intended to diagnose MRSA infection nor to guide or monitor treatment for MRSA infections.       Time coordinating discharge: Over 30 minutes  SIGNED:   Debbe Odea, MD  [facility] 02/23/2017, 3:14 PM Pager   If 7PM-7AM, please contact night-coverage [email or web address] Password [email or web address]

## 2017-02-23 NOTE — Evaluation (Signed)
Physical Therapy Evaluation Patient Details Name: Landa Mullinax MRN: [id number] DOB: [birth year] Today's Date: 02/23/2017   History of Present Illness  Rosalita Carey is a 83 y.o. female with known past medical history of fibrillation on xarelto, pulmonary embolism, obesity, hypertension, depression, prior lung cancer, anal cancer status post colostomy, diastolic heart failure symptoms class II, presents with complaints of shortness of breath and LE edema.  Patient with CHF exacerbation.     Clinical Impression  Patient presents with problems listed below.  Patient will benefit from acute PT to maximize functional independence prior to discharge home.  Patient receiving HHPT pta - recommend that HHPT continue at d/c.  During gait, patient with SaO2 dropping to 84% while on 2L O2.  Returned to 92% within 1 minute with rest and pursed-lip breathing.    Follow Up Recommendations Home health PT;Supervision - Intermittent    Equipment Recommendations  None recommended by PT    Recommendations for Other Services       Precautions / Restrictions Precautions Precautions: Fall Precaution Comments: Patient with almost constant urination of small amounts.  Used adult diaper during gait. Restrictions Weight Bearing Restrictions: No      Mobility  Bed Mobility Overal bed mobility: Modified Independent             General bed mobility comments: Increased time  Transfers Overall transfer level: Modified independent Equipment used: Rolling walker (2 wheeled)             General transfer comment: Patient using correct hand placement.  Able to stand with no assist.  While standing, put on adult diaper due to incontinence.  Ambulation/Gait Ambulation/Gait assistance: Supervision Ambulation Distance (Feet): 220 Feet Assistive device: Rolling walker (2 wheeled) Gait Pattern/deviations: Step-through pattern;Decreased stride length;Trunk flexed   Gait velocity interpretation: at or  above normal speed for age/gender General Gait Details: Patient with slow, steady gait with RW.  Cues to hold head up during gait.  Patient ambulating with 2L O2.  SaO2 decreased to 84% after 220'.  With sitting rest with deep breathing, patient able to increase SaO2 to 92% within 1 minute.  Stairs            Wheelchair Mobility    Modified Rankin (Stroke Patients Only)       Balance Overall balance assessment: Needs assistance Sitting-balance support: No upper extremity supported;Feet supported Sitting balance-Leahy Scale: Good     Standing balance support: Single extremity supported Standing balance-Leahy Scale: Poor                               Pertinent Vitals/Pain Pain Assessment: No/denies pain    Home Living Family/patient expects to be discharged to:: Private residence Living Arrangements: Alone Available Help at Discharge: Family;Available PRN/intermittently Type of Home: House Home Access: Stairs to enter Entrance Stairs-Rails: Right;Left;Can reach both Entrance Stairs-Number of Steps: 4 Home Layout: One level Home Equipment: Walker - 2 wheels;Cane - single point;Shower seat;Walker - 4 wheels;Bedside commode Additional Comments: Patient's daughter is there in am to get patient up and make sure meals are ready.  In pm, she is there for meal, meds, and to assist patient to bed.  Son's try to visit periodically during day.    Prior Function Level of Independence: Independent with assistive device(s);Needs assistance   Gait / Transfers Assistance Needed: Patient able to ambulate with RW on her own.  ADL's / Homemaking Assistance Needed: Patient able to do sponge  bath and dressing.  Daughter assists with a shower during the week, meal prep, housekeeping, medication management.        Hand Dominance        Extremity/Trunk Assessment   Upper Extremity Assessment Upper Extremity Assessment: Overall WFL for tasks assessed    Lower Extremity  Assessment Lower Extremity Assessment: Generalized weakness       Communication   Communication: HOH  Cognition Arousal/Alertness: Awake/alert Behavior During Therapy: WFL for tasks assessed/performed Overall Cognitive Status: Within Functional Limits for tasks assessed (Some issues remembering medical hx.)                                        General Comments      Exercises     Assessment/Plan    PT Assessment Patient needs continued PT services  PT Problem List Decreased strength;Decreased balance;Decreased mobility;Cardiopulmonary status limiting activity;Obesity       PT Treatment Interventions DME instruction;Gait training;Stair training;Functional mobility training;Therapeutic activities;Therapeutic exercise;Balance training;Patient/family education    PT Goals (Current goals can be found in the Care Plan section)  Acute Rehab PT Goals Patient Stated Goal: To return home PT Goal Formulation: With patient/family Time For Goal Achievement: 03/02/17 Potential to Achieve Goals: Good    Frequency Min 3X/week   Barriers to discharge Decreased caregiver support Lives alone.  Daughter works.  Patient is alone during work hours of daughter, and at night after in bed.    Co-evaluation               AM-PAC PT "6 Clicks" Daily Activity  Outcome Measure Difficulty turning over in bed (including adjusting bedclothes, sheets and blankets)?: None Difficulty moving from lying on back to sitting on the side of the bed? : A Little Difficulty sitting down on and standing up from a chair with arms (e.g., wheelchair, bedside commode, etc,.)?: A Little Help needed moving to and from a bed to chair (including a wheelchair)?: None Help needed walking in hospital room?: None Help needed climbing 3-5 steps with a railing? : A Little 6 Click Score: 21    End of Session Equipment Utilized During Treatment: Gait belt;Oxygen Activity Tolerance: Patient tolerated  treatment well Patient left: in chair;with call bell/phone within reach;with family/visitor present Nurse Communication: Mobility status (SaO2 drop with gait on 2L O2.) PT Visit Diagnosis: Unsteadiness on feet (R26.81);Muscle weakness (generalized) (M62.81)    Time: 9518-8416 PT Time Calculation (min) (ACUTE ONLY): 42 min   Charges:   PT Evaluation $PT Eval Moderate Complexity: 1 Procedure PT Treatments $Gait Training: 23-37 mins   PT G Codes:   PT G-Codes **NOT FOR INPATIENT CLASS** Functional Assessment Tool Used: AM-PAC 6 Clicks Basic Mobility;Clinical judgement Functional Limitation: Mobility: Walking and moving around Mobility: Walking and Moving Around Current Status (S0630): At least 1 percent but less than 20 percent impaired, limited or restricted Mobility: Walking and Moving Around Goal Status [id number]): At least 1 percent but less than 20 percent impaired, limited or restricted    Carita Pian. Sanjuana Kava, [facility] Acute Rehab Services Pager [place] 02/23/2017, 1:48 PM

## 2017-02-23 NOTE — Progress Notes (Signed)
Pt had a 2.06 sec pause on telmetry. BP was 111/51, HR 57, R 17, O2 98 on 2L [place]. Provider [place] notified.

## 2017-02-23 NOTE — Progress Notes (Signed)
SATURATION QUALIFICATIONS: (This note is used to comply with regulatory documentation for home oxygen)  Patient Saturations on 2L O2 at Rest = 94%  Patient Saturations on 2 Liters of oxygen while Ambulating = 84%  Patient Saturations on 2 Liters of oxygen at rest for 60 seconds = 92%  Please briefly explain why patient needs home oxygen:  O2 sats dropped to 84% on 2L O2 while ambulating 220'.  Carita Pian Sanjuana Kava, [place] Pager [phone]

## 2017-02-23 NOTE — Care Management Note (Signed)
Case Management Note  Patient Details  Name: Mikita Lesmeister MRN: [id number] Date of Birth: [birth year]  Subjective/Objective:                 Spoke w patient and daughter. Patient active w [facility]. Faxed referral for resuming care.    Action/Plan:  DC to home w home health services.   Expected Discharge Date:  02/23/17               Expected Discharge Plan:  [place]  In-House Referral:     Discharge planning Services  CM Consult  Post Acute Care Choice:  Home Health Choice offered to:  Patient, Adult Children  DME Arranged:    DME Agency:     HH Arranged:  RN, PT, OT, Nurse's Aide [place] Agency:  [facility]  Status of Service:  Completed, signed off  If discussed at [place] of Stay Meetings, dates discussed:    Additional Comments:  Carles Collet, RN 02/23/2017, 3:59 PM

## 2017-02-24 DIAGNOSIS — F329 Major depressive disorder, single episode, unspecified: Secondary | ICD-10-CM | POA: Diagnosis not present

## 2017-02-24 DIAGNOSIS — I110 Hypertensive heart disease with heart failure: Secondary | ICD-10-CM | POA: Diagnosis not present

## 2017-02-24 DIAGNOSIS — I481 Persistent atrial fibrillation: Secondary | ICD-10-CM | POA: Diagnosis not present

## 2017-02-24 DIAGNOSIS — I5030 Unspecified diastolic (congestive) heart failure: Secondary | ICD-10-CM | POA: Diagnosis not present

## 2017-02-24 DIAGNOSIS — K219 Gastro-esophageal reflux disease without esophagitis: Secondary | ICD-10-CM | POA: Diagnosis not present

## 2017-02-24 DIAGNOSIS — J441 Chronic obstructive pulmonary disease with (acute) exacerbation: Secondary | ICD-10-CM | POA: Diagnosis not present

## 2017-02-25 ENCOUNTER — Ambulatory Visit
Admission: RE | Admit: 2017-02-25 | Discharge: 2017-02-25 | Disposition: A | Discharge status: Home / Self Care | Payer: Medicare Other | Source: Ambulatory Visit | Attending: Urology | Admitting: Urology

## 2017-02-25 ENCOUNTER — Encounter: Payer: Self-pay | Admitting: Urology

## 2017-02-25 ENCOUNTER — Ambulatory Visit: Payer: Medicare Other | Admitting: Internal Medicine

## 2017-02-25 ENCOUNTER — Ambulatory Visit (INDEPENDENT_AMBULATORY_CARE_PROVIDER_SITE_OTHER): Payer: Medicare Other | Admitting: Pulmonary Disease

## 2017-02-25 ENCOUNTER — Encounter: Payer: Self-pay | Admitting: Pulmonary Disease

## 2017-02-25 ENCOUNTER — Telehealth: Payer: Self-pay | Admitting: *Deleted

## 2017-02-25 VITALS — BP 124/60 | HR 70 | Temp 98.7°F | Resp 18 | Ht 64.0 in | Wt 258.6 lb

## 2017-02-25 VITALS — BP 124/60 | HR 70 | Ht 64.0 in | Wt 258.6 lb

## 2017-02-25 DIAGNOSIS — Z7901 Long term (current) use of anticoagulants: Secondary | ICD-10-CM | POA: Insufficient documentation

## 2017-02-25 DIAGNOSIS — Z79899 Other long term (current) drug therapy: Secondary | ICD-10-CM | POA: Diagnosis not present

## 2017-02-25 DIAGNOSIS — R918 Other nonspecific abnormal finding of lung field: Secondary | ICD-10-CM | POA: Diagnosis not present

## 2017-02-25 DIAGNOSIS — R0602 Shortness of breath: Secondary | ICD-10-CM | POA: Diagnosis not present

## 2017-02-25 DIAGNOSIS — Z933 Colostomy status: Secondary | ICD-10-CM | POA: Diagnosis not present

## 2017-02-25 DIAGNOSIS — J42 Unspecified chronic bronchitis: Secondary | ICD-10-CM

## 2017-02-25 DIAGNOSIS — C211 Malignant neoplasm of anal canal: Secondary | ICD-10-CM | POA: Insufficient documentation

## 2017-02-25 DIAGNOSIS — C210 Malignant neoplasm of anus, unspecified: Secondary | ICD-10-CM

## 2017-02-25 DIAGNOSIS — Z08 Encounter for follow-up examination after completed treatment for malignant neoplasm: Secondary | ICD-10-CM | POA: Diagnosis not present

## 2017-02-25 DIAGNOSIS — Z85048 Personal history of other malignant neoplasm of rectum, rectosigmoid junction, and anus: Secondary | ICD-10-CM | POA: Diagnosis not present

## 2017-02-25 LAB — PULMONARY FUNCTION TEST
FEF 25-75 POST: 0.53 L/s
FEF 25-75 PRE: 0.32 L/s
FEF2575-%Change-Post: 66 %
FEF2575-%PRED-POST: 43 %
FEF2575-%PRED-PRE: 26 %
FEV1-%Change-Post: 17 %
FEV1-%PRED-PRE: 38 %
FEV1-%Pred-Post: 45 %
FEV1-POST: 0.82 L
FEV1-PRE: 0.7 L
FEV1FVC-%CHANGE-POST: -3 %
FEV1FVC-%PRED-PRE: 81 %
FEV6-%CHANGE-POST: 20 %
FEV6-%PRED-POST: 61 %
FEV6-%Pred-Pre: 50 %
FEV6-Post: 1.42 L
FEV6-Pre: 1.17 L
FEV6FVC-%CHANGE-POST: 0 %
FEV6FVC-%Pred-Post: 105 %
FEV6FVC-%Pred-Pre: 106 %
FVC-%Change-Post: 21 %
FVC-%PRED-POST: 58 %
FVC-%Pred-Pre: 48 %
FVC-Post: 1.43 L
FVC-Pre: 1.18 L
POST FEV1/FVC RATIO: 57 %
Post FEV6/FVC ratio: 99 %
Pre FEV1/FVC ratio: 59 %
Pre FEV6/FVC Ratio: 100 %
RV % pred: 556 %
RV: 13.68 L
TLC % pred: 293 %
TLC: 14.82 L

## 2017-02-25 MED ORDER — BUDESONIDE 0.5 MG/2ML IN SUSP: 0.5000 mg | Freq: Two times a day (BID) | RESPIRATORY_TRACT | 5 refills | Status: DC

## 2017-02-25 MED ORDER — ARFORMOTEROL TARTRATE 15 MCG/2ML IN NEBU: 15.0000 ug | INHALATION_SOLUTION | Freq: Two times a day (BID) | RESPIRATORY_TRACT | 5 refills | Status: DC

## 2017-02-25 NOTE — Progress Notes (Signed)
PFT done today. 

## 2017-02-25 NOTE — Progress Notes (Signed)
Tricia Hernandez    [id number]    Apr 03, 1931  Primary Care Physician:[name], [name], NP  Referring Physician: Flossie Buffy, NP [place], [place] [address]  Chief complaint:   Follow up of  COPD Lung nodules. 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 She used to follow Dr. Audie Box, Pulmonologist at [place], [place]. 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 Work up included CT scan of the chest and PET scan which shows non avid stable lung nodules. She was recently evaluated for uncontrolled atrial fibrillation and loaded with Tikosyn. She also had an episode of pneumonia in January 2018 for which she was admitted and treated with steroids and antibiotics.   She has a remote smoking history of about 5 pack years and exposure to secondhand smoke. Quit smoking in 1975. 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 She has been exposed to a lot of dust and fibre inhalation during her work in [employer].   Outpatient Encounter Prescriptions as of 02/25/2017  Medication Sig  . apixaban (ELIQUIS) 5 MG TABS tablet Take 1 tablet (5 mg total) by mouth 2 (two) times daily.  Marland Kitchen atorvastatin (LIPITOR) 20 MG tablet Take 1 tablet (20 mg total) by mouth daily.  . budesonide (PULMICORT) 0.25 MG/2ML nebulizer solution Take 2 mLs (0.25 mg total) by nebulization 2 (two) times daily.  . Calcium 600-200 MG-UNIT tablet Take 1 tablet by mouth daily.  Marland Kitchen dofetilide (TIKOSYN) 500 MCG capsule Take 1 capsule (500 mcg total) by mouth 2 (two) times daily.  Marland Kitchen ENSURE PLUS (ENSURE PLUS) LIQD Take 237 mLs by mouth daily.   .  ergocalciferol (VITAMIN D2) 50000 units capsule Take 50,000 Units by mouth once a week.  . fenofibrate 160 MG tablet Take 1 tablet (160 mg total) by mouth daily.  . ferrous sulfate 325 (65 FE) MG tablet Take 1 tablet (325 mg total) by mouth daily with breakfast. (Patient taking differently: Take 650 mg by mouth daily with breakfast. )  . fluticasone (FLONASE) 50 MCG/ACT nasal spray Place 1 spray into both nostrils daily.  . furosemide (LASIX) 40 MG tablet Take 1 tablet (40 mg total) by mouth daily. For 3 days and then back to 20 mg daily  . gabapentin (NEURONTIN) 300 MG capsule 300 mg 3 (three) times daily.   Marland Kitchen ipratropium-albuterol (DUONEB) 0.5-2.5 (3) MG/3ML SOLN Take 3 mLs by nebulization 2 (two) times daily.  . magnesium oxide (MAG-OX) 400 (241.3 Mg) MG tablet Take 1 tablet (400 mg total) by mouth daily.  . metoprolol tartrate (LOPRESSOR) 25 MG tablet Take 0.5 tablets (12.5 mg total) by mouth 2 (two) times daily.  . montelukast (SINGULAIR) 10 MG tablet Take 1 tablet (10 mg total) by mouth at bedtime.  [employer] KIT 8514 is number on bag one piece unit. Colostomy bag and adhesive and alcohol prep, no sting, cannot use regular prep pad.  . pantoprazole (PROTONIX) 40 MG tablet Take 1 tablet (40 mg total) by mouth daily.  Marland Kitchen PARoxetine (PAXIL) 20 MG tablet Take 20 mg by mouth at bedtime.   Marland Kitchen  senna-docusate (SENOKOT-S) 8.6-50 MG tablet Take 1 tablet by mouth at bedtime as needed for mild constipation.   No facility-administered encounter medications on file as of 02/25/2017.     Allergies as of 02/25/2017 - Review Complete 02/25/2017  Allergen Reaction Noted  . Penicillins Anaphylaxis 06/14/2016  . Aleve [naproxen sodium] Hives 07/22/2016  . Antihistamines, chlorpheniramine-type Rash   . Aspirin Rash 06/14/2016  . Benadryl [diphenhydramine hcl] Palpitations 10/29/2016  . Codeine Rash 06/14/2016  . Hydrochlorothiazide Rash 07/22/2016  . Rocephin [ceftriaxone sodium in dextrose] Rash  06/14/2016  . Sulfa antibiotics Rash 06/14/2016    Past Medical History:  Diagnosis Date  . Anal squamous cell carcinoma ([place]) 07/2016   "spread to lymph nodes and groins"   . Anemia   . Anxiety   . Atrial fibrillation ([place])   . Basal cell carcinoma of skin of nose   . CHF (congestive heart failure) ([place]) 11/2015  . Chronic bronchitis ([place])   . Chronic depression   . Colostomy in place [facility]) 07/2016  . Diverticular disease   . Fibrocystic disease of breast   . Fracture of shoulder   . GERD (gastroesophageal reflux disease)   . GI bleed 12/10/2016   in setting of no PPI. 5/2 EGD: grade A reflux esophagitis.  Mild, non-hemorrhagic gastritis.  Ablation of 3 Duodenal AVMs  . Herpes zoster   . History of blood transfusion    for vaginal, uterine bleeding leading to hysterectomy.  in 12/2016 transfused x 1 for GI bleed.   Marland Kitchen History of hiatal hernia   . Hyperlipidemia   . Hypertension   . Labyrinthitis   . Lung cancer ([place])    RML  . Obesity   . Paroxysmal supraventricular tachycardia ([place])   . Peptic ulcer of stomach    hx  . Pneumonia 1980s X 1; 08/2016   "walking; double"  . Pulmonary embolism ([place]) 05/2014   "left lung"  . Vitamin D deficiency     Past Surgical History:  Procedure Laterality Date  . APPENDECTOMY  1959  . BASAL CELL CARCINOMA EXCISION     nose  . BREAST CYST ASPIRATION Right 2016?  Marland Kitchen CATARACT EXTRACTION W/ INTRAOCULAR LENS  IMPLANT, BILATERAL Bilateral 1990s  . CHOLECYSTECTOMY OPEN  1980s  . COLOSTOMY N/A 07/29/2016   Procedure: COLOSTOMY;  Surgeon: Clovis Riley, MD;  [facility];  Service: General;  Laterality: N/A;  . DILATION AND CURETTAGE OF UTERUS    . ESOPHAGOGASTRODUODENOSCOPY N/A 12/11/2016   Procedure: ESOPHAGOGASTRODUODENOSCOPY (EGD);  Surgeon: Jerene Bears, MD;  Location: [facility] ENDOSCOPY;  Service: Endoscopy;  Laterality: N/A;  . EVALUATION UNDER ANESTHESIA WITH HEMORRHOIDECTOMY AND PROCTOSCOPY N/A 07/25/2016   Procedure: EXAM UNDER  ANESTHESIA, EXCISION PERIANAL MASS.;  Surgeon: Judeth Horn, MD;  Location: [place];  Service: General;  Laterality: N/A;  Prone position  . LAPAROSCOPIC DIVERTED COLOSTOMY N/A 07/29/2016   Procedure: ATTEMPTED LAPAROSCOPIC ASSISTED COLOSTOMY;  Surgeon: Clovis Riley, MD;  Location: [facility];  Service: General;  Laterality: N/A;  . LUNG REMOVAL, PARTIAL Right 2013   RML mass/carcinoid, Dr Lurena Nida  . TONSILLECTOMY AND ADENOIDECTOMY  1960s  . VAGINAL HYSTERECTOMY  1959   "partial"    Family History  Problem Relation Age of Onset  . Heart attack Mother        Dec 1987  . CVA Mother   . Hypertension Mother   . Multiple myeloma Mother   . Breast cancer Sister   . Skin cancer Sister   .  Prostate cancer Brother   . Throat cancer Brother   . Cervical cancer Daughter   . Leukemia Daughter   . Leukemia Son   . Throat cancer Brother   . Cancer Brother        soft palette  . Heart Problems Brother        Pacemaker  . Colon cancer Neg Hx   . Pancreatic cancer Neg Hx     Social History   Social History  . Marital status: Widowed    Spouse name: N/A  . Number of children: 5  . Years of education: N/A   Occupational History  . retired    Social History Main Topics  . Smoking status: Former Smoker    Packs/day: 0.50    Years: 15.00    Types: Cigarettes    Quit date: 1976  . Smokeless tobacco: Never Used  . Alcohol use No  . Drug use: No  . Sexual activity: No   Other Topics Concern  . Not on file   Social History Narrative  . No narrative on file   Review of systems: Review of Systems  Constitutional: Negative for fever and chills.  HENT: Negative.   Eyes: Negative for blurred vision.  Respiratory: as per HPI  Cardiovascular: Negative for chest pain and palpitations.  Gastrointestinal: Negative for vomiting, diarrhea, blood per rectum. Genitourinary: Negative for dysuria, urgency, frequency and hematuria.  Musculoskeletal: Negative for myalgias, back pain and joint  pain.  Skin: Negative for itching and rash.  Neurological: Negative for dizziness, tremors, focal weakness, seizures and loss of consciousness.  Endo/Heme/Allergies: Negative for environmental allergies.  Psychiatric/Behavioral: Negative for depression, suicidal ideas and hallucinations.  All other systems reviewed and are negative.  Physical Exam: Blood pressure 124/60, pulse 70, height 5' 4" (1.626 m), weight 258 lb 9.6 oz (117.3 kg), SpO2 92 %. Gen:      No acute distress HEENT:  EOMI, sclera anicteric Neck:     No masses; no thyromegaly Lungs:    Clear to auscultation bilaterally; normal respiratory effort CV:         Regular rate and rhythm; no murmurs Abd:      + bowel sounds; soft, non-tender; no palpable masses, no distension Ext:    No edema; adequate peripheral perfusion Skin:      Warm and dry; no rash Neuro: alert and oriented x 3 Psych: normal mood and affect  Data Reviewed: CT chest 07/25/16-postsurgical changes in right middle lung, multiple subcentimeter pulmonary nodules. CTA chest 08/27/16-no pulmonary embolus, stable postsurgical changes, multiple subcentimeter pulmonary nodules. Consolidative changes in bilateral lungs, small bilateral pleural effusions. PET scan 09/16/16- resolution of consolidative changes, stable postsurgical changes, stable subcentimeter pulmonary nodules which are non-PET avid  I have reviewed all the images personally  PFTs 02/26/27 FVC 1.43 [58%) FEV1 0.82 (45%) F/F 57 TLC 293% RV/TLC 188% Severe obstruction with bronchodilator response, hyperinflation  Assessment:  Severe COPD PFTs reviewed today. They show severe obstruction with hyperinflation and bronchodilator response (as shown by improvement in FVC]. This is more severe than expected for her smoke exposure. Check A1AT levels and phenotype.  Given the hyperinflation and airtrapping she will need optimization of inhalers. We will increase pulmicort to 0.5 mg bid and add brovana nebs.  She will continue on the duo nebs, supplemental O2 and is requesting a portable concentrator.  Pulmonary nodules H/O lung carcinoid s/p resection The pulmonary nodules have remained stable from December 2017 to February 2018 and are non-PET avid   which is reassuring. These are likely benign processes or possibly carcinoid. We'll continue to observe these on the surveillance CTs that she is due to get for her anal cancer.   Recent admission for Pneumonia Noted to have consolidative changes on CT in January due pneumonia. These have resolved after antibiotic therapy.  Plan/Recommendations: - Increase pulmicort, add brovana. Continue duonebs. - Follow pulmonary nodules on CT scan - Check A1AT levels and phenotype.  - Order portable concentrator.     MD [facility] Pager [phone] 02/25/2017, 3:06 PM  CC: [name], [place] Lum, NP   

## 2017-02-25 NOTE — Progress Notes (Signed)
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 She is accomanied by her daughter, Tricia Hernandez.  She reports that overall, she is doing much better since her recent hospitalization.  She reports that she has recovered well from her previous radiotherapy and has no complaints related to that.  She specifically denies blood per rectum, blood in her stools in colostomy bag, rectal pain or abdominal pain. She will occasionally have some clear jelly seepage from the rectum.              Follow up CT C/A/P on 12/30/16 suggested stable disease with nonspecific mild focal wall thickening in the posterior left lower rectum and decreased inguinal lymphadenopathy as compared to previous PET on 09/16/16.  Lung nodules previously noted appear stable to decreased in size as compared to CT chest 07/25/16.    She is followed with Dr. Lindi Adie in medical oncology who has recommended continued observation with annual CT chest/abdomen/pelvis and 6 month follow up visits. She has a scheduled follow up appointment with Dr. Lindi Adie on 06/24/17.  She has a history of chronic blood loss anemia with GI bleeds managed by Dr. Carlean Purl.  She was recently evaluated on 02/21/17 and is scheduled for capsule endoscopy on 03/04/17 for further evaluation of the small bowel to r/o a significant source of active bleeding.  She is followed by Dr. Vaughan Browner in Pulmonology for history of lung carcinoid- s/p right lung resection in 2012.  She had an office visit with Dr. Vaughan Browner today for  pulmonary function testing and follow up, just prior to our appointment today.He has recommended that she continue with O2 24/7.  She was recently hospitalized overnight (7/14 - 7/15) for acute respiratory failure with hypoxia.  She was discharged home with 4L O2 via [place] to be used with any activity/ambulation as well as Lasix 23m daily for 3 days followed by 282mdaily thereafter.  She reports that her breathing has greatly improved since increasing her dose of Lasix and using the O2.  Currently, she denies chest pain, increased shortness of breath, hemoptysis, productive cough, fever or chills.     On review of systems, the patient states that she is doing well overall and has recovered well from the effects of radiotherapy. Her fatigue is gradually improving and she is slowly regaining strength.  She has a colostomy bag in place which continues to drain normal/loose stool with no blood noted.  They are changing the colostomy bag regularly and deny any skin breakdown or irritation at the colostomy site. The skin on her buttocks is dry and intact.  She has not had any rectal pain. She denies fever, chills, night sweats, chest pain, increased shortness of breath, nausea or vomiting.  Her appetite is good and she is maintaining her weight.  ALLERGIES:  is allergic to penicillins; aleve [naproxen sodium]; antihistamines, chlorpheniramine-type; aspirin; benadryl [diphenhydramine hcl]; codeine; hydrochlorothiazide; rocephin [ceftriaxone sodium in dextrose]; and sulfa antibiotics.  Meds: Current Outpatient Prescriptions  Medication Sig Dispense Refill  .  apixaban (ELIQUIS) 5 MG TABS tablet Take 1 tablet (5 mg total) by mouth 2 (two) times daily. 180 tablet 1  . atorvastatin (LIPITOR) 20 MG tablet Take 1 tablet (20 mg total) by mouth daily. 90 tablet 1  . budesonide (PULMICORT) 0.25 MG/2ML nebulizer solution Take 2 mLs (0.25 mg total) by nebulization 2 (two) times daily. 60 mL 12  . Calcium 600-200 MG-UNIT  tablet Take 1 tablet by mouth daily.    Marland Kitchen dofetilide (TIKOSYN) 500 MCG capsule Take 1 capsule (500 mcg total) by mouth 2 (two) times daily. 180 capsule 3  . ENSURE PLUS (ENSURE PLUS) LIQD Take 237 mLs by mouth daily.     . ergocalciferol (VITAMIN D2) 50000 units capsule Take 50,000 Units by mouth once a week.    . fenofibrate 160 MG tablet Take 1 tablet (160 mg total) by mouth daily. 90 tablet 1  . ferrous sulfate 325 (65 FE) MG tablet Take 1 tablet (325 mg total) by mouth daily with breakfast. (Patient taking differently: Take 650 mg by mouth daily with breakfast. ) 90 tablet 1  . fluticasone (FLONASE) 50 MCG/ACT nasal spray Place 1 spray into both nostrils daily.    . furosemide (LASIX) 40 MG tablet Take 1 tablet (40 mg total) by mouth daily. For 3 days and then back to 20 mg daily 30 tablet 0  . gabapentin (NEURONTIN) 300 MG capsule 300 mg 3 (three) times daily.     . magnesium oxide (MAG-OX) 400 (241.3 Mg) MG tablet Take 1 tablet (400 mg total) by mouth daily. 60 tablet 1  . metoprolol tartrate (LOPRESSOR) 25 MG tablet Take 0.5 tablets (12.5 mg total) by mouth 2 (two) times daily. 15 tablet 4  . montelukast (SINGULAIR) 10 MG tablet Take 1 tablet (10 mg total) by mouth at bedtime. 90 tablet 1  . Ost[employer] Supplies KIT 8514 is number on bag one piece unit. Colostomy bag and adhesive and alcohol prep, no sting, cannot use regular prep pad. 30 each 11  . pantoprazole (PROTONIX) 40 MG tablet Take 1 tablet (40 mg total) by mouth daily.    Marland Kitchen PARoxetine (PAXIL) 20 MG tablet Take 20 mg by mouth at bedtime.     Marland Kitchen arformoterol (BROVANA) 15 MCG/2ML NEBU Take 2 mLs (15 mcg total) by nebulization 2 (two) times daily. Dx copd: J44.9 120 mL 5  . budesonide (PULMICORT) 0.5 MG/2ML nebulizer solution Take 2 mLs (0.5 mg total) by nebulization 2 (two) times daily. Dx code: j44.9 120 mL 5  . ipratropium-albuterol (DUONEB) 0.5-2.5 (3) MG/3ML SOLN Take 3 mLs by nebulization 2 (two) times daily. 540 mL 0  .  senna-docusate (SENOKOT-S) 8.6-50 MG tablet Take 1 tablet by mouth at bedtime as needed for mild constipation. (Patient not taking: Reported on 02/25/2017)     No current facility-administered medications for this encounter.     Physical Findings:  height is 5' 4"  (1.626 m) and weight is 258 lb 9.6 oz (117.3 kg). Her oral temperature is 98.7 F (37.1 C). Her blood pressure is 124/60 and her pulse is 70. Her respiration is 18 and oxygen saturation is 99%.  Pain Assessment Pain Score: 0-No pain0/10 In general this is a well appearing, obese caucasian female in no acute distress. She's alert and oriented x4 and appropriate throughout the examination. Cardiopulmonary assessment is negative for acute distress and she exhibits normal effort.  There is no palpable inguinal lymphadenopathy.  Abdomen is soft, non-tender and non-distended. The colostomy site is clean and without  signs of infection.  Colostomy is draining appropriately with dark brown stool present.  Her skin is well healed without desquamation or erythema on the buttocks. There is no rectal drainage, bleeding or mass visualized. Rectal exam is performed and is without palpable rectal mass.  Patient declined vaginal exam today.  Lab Findings: Lab Results  Component Value Date   WBC 6.2 02/22/2017   HGB 8.2 (L) 02/22/2017   HCT 28.4 (L) 02/22/2017   MCV 94.4 02/22/2017   PLT 198 02/22/2017     Radiographic Findings: Dg Chest 2 View  Result Date: 02/23/2017 CLINICAL DATA:  Shortness of Breath EXAM: CHEST  2 VIEW COMPARISON:  Cough 02/22/2017 FINDINGS: Cardiomegaly. Vascular congestion. Postsurgical changes in the right mid lung. Areas of atelectasis or scarring in the right mid and lower lung. No confluent opacity on the left. Small right effusion. IMPRESSION: Areas of right mid and lower lung atelectasis or scarring with postoperative changes on the right. Small right effusion. Cardiomegaly, vascular congestion. Electronically Signed    By: Rolm Baptise M.D.   On: 02/23/2017 07:50   Dg Chest Portable 1 View  Result Date: 02/22/2017 CLINICAL DATA:  83 year old female with shortness of breath. History of anal cancer and radiation treatment. Prior right middle lobe surgery for resection of mass/carcinoid. EXAM: PORTABLE CHEST 1 VIEW COMPARISON:  Chest radiograph dated 12/10/2016 and CT dated 08/27/2016 FINDINGS: Postsurgical changes in the right mid lung field. There diffuse bilateral interstitial prominence and hazy airspace densities predominantly involving the mid to lower lung fields. Overall degree of airspace densities have increased compared to the radiograph of 12/10/2016. Small pleural effusions may be present. There is no pneumothorax. Stable cardiac silhouette. No acute osseous pathology. IMPRESSION: Postsurgical changes of the right mid lung field. Bilateral mid to lower lung field interstitial prominence and airspace densities with interval progression compared to the prior radiograph noted concerning for an infectious process. Clinical correlation is recommended. Electronically Signed   By: Anner Crete M.D.   On: 02/22/2017 05:52    Impression/Plan: 1. Stage T3 N3 MX (possible lung metastases) squamous cell carcinoma of the anal canal - Stage IIIB vs IV.  The patient appears to be doing well following completion of radiotherapy. She will follow up with Dr. Lindi Adie as planned on 06/24/17 and every 6 months thereafter, with plans to obtain CT Chest/Abd/Pelvis annually.  Her next planned CT scan is to occur in November, prior to her follow up appointment with Dr. Lindi Adie.   I have advised that she will need continued evaluation with general surgery for anoscopy every 6-12 months.  Her daughter, Tricia Hernandez, will call for this appointment. We will continue to follow her annually for DRE and inguinal lymph node assessment. Since she travels from [place], [place], we will try and coordinate her follow-up appointment in our office on the  same date as follow up with medical oncology. In the interim, she knows to contact us with any questions or concerns related to her previous radiotherapy.    Nicholos Johns, PA-C

## 2017-02-25 NOTE — Telephone Encounter (Signed)
Called pt/daughter to set-up TCM hosp appt. Spoke w/sue (daughter) they are just leaving another doctors appt she will give [place] a call abck tomorrow to set-up hosp f/u appt...Tricia Hernandez

## 2017-02-25 NOTE — Patient Instructions (Addendum)
I reviewed the pulmonary function tests. They shows severe emphysema, COPD We will change your medications for better control of shortness of breath We will increase Pulmicort to 0.5 mg twice a day. We will start Brovana nebulizer twice a day Continue duonebs Check A1AT levels and phenotype.   We will assess if we can get a portable concentrator. Continue supplemental o2  Return in 3 months

## 2017-02-26 ENCOUNTER — Telehealth: Payer: Self-pay | Admitting: *Deleted

## 2017-02-26 DIAGNOSIS — I110 Hypertensive heart disease with heart failure: Secondary | ICD-10-CM | POA: Diagnosis not present

## 2017-02-26 DIAGNOSIS — K219 Gastro-esophageal reflux disease without esophagitis: Secondary | ICD-10-CM | POA: Diagnosis not present

## 2017-02-26 DIAGNOSIS — F329 Major depressive disorder, single episode, unspecified: Secondary | ICD-10-CM | POA: Diagnosis not present

## 2017-02-26 DIAGNOSIS — I5030 Unspecified diastolic (congestive) heart failure: Secondary | ICD-10-CM | POA: Diagnosis not present

## 2017-02-26 DIAGNOSIS — I481 Persistent atrial fibrillation: Secondary | ICD-10-CM | POA: Diagnosis not present

## 2017-02-26 DIAGNOSIS — J441 Chronic obstructive pulmonary disease with (acute) exacerbation: Secondary | ICD-10-CM | POA: Diagnosis not present

## 2017-02-26 DIAGNOSIS — D649 Anemia, unspecified: Secondary | ICD-10-CM | POA: Diagnosis not present

## 2017-02-26 NOTE — Telephone Encounter (Signed)
CALLED PATIENT TO INFORM OF 1 YR. FU WITH ASHLYN BRUNING ON 02-24-18 @ 1 PM, PATIENT'S DAUGHTER - SUE PRESSLEY, AND SHE REQUESTED THIS DAY

## 2017-02-27 ENCOUNTER — Telehealth: Payer: Self-pay | Admitting: Internal Medicine

## 2017-02-27 NOTE — Telephone Encounter (Signed)
Se labs in your office.  Hgb down to 7.1 .  Daughter calling for the results. I put them on your chair.

## 2017-02-27 NOTE — Telephone Encounter (Signed)
Called daughter back since appt has not been made. Daughter states they are going to hold off on the appt w/[place]. Mom saw the pulmonologist & oncologist on tues. They have check her oxygen and he put her on 2 liters. they dx her w/ severe COPD too. Daughter states she will make appt once mom start feeling better...Johny Chess

## 2017-02-27 NOTE — Telephone Encounter (Signed)
I spoke to daughter Cammy Copa - I have recommended that we have patient get transfusion of 2 U PRBC in short stay - hopefully Monday  She is having a capsule endo w/ [place] Tuesday - the transfusion should take priority over that if we need to reschedule  Advised if she deteriorates more then to ED before  Also - transfuse each U over 3 hrs and give 40 mg furosemide IV between units

## 2017-02-28 ENCOUNTER — Encounter (HOSPITAL_COMMUNITY): Payer: Self-pay | Admitting: *Deleted

## 2017-02-28 ENCOUNTER — Other Ambulatory Visit: Payer: Self-pay

## 2017-02-28 ENCOUNTER — Emergency Department (HOSPITAL_COMMUNITY)
Admission: EM | Admit: 2017-02-28 | Discharge: 2017-02-28 | Disposition: A | Discharge status: Home / Self Care | Payer: Medicare Other | Attending: Emergency Medicine | Admitting: Emergency Medicine

## 2017-02-28 ENCOUNTER — Emergency Department (HOSPITAL_COMMUNITY): Payer: Medicare Other

## 2017-02-28 DIAGNOSIS — Z9049 Acquired absence of other specified parts of digestive tract: Secondary | ICD-10-CM | POA: Diagnosis not present

## 2017-02-28 DIAGNOSIS — Z87891 Personal history of nicotine dependence: Secondary | ICD-10-CM | POA: Diagnosis not present

## 2017-02-28 DIAGNOSIS — I481 Persistent atrial fibrillation: Secondary | ICD-10-CM | POA: Diagnosis not present

## 2017-02-28 DIAGNOSIS — Z79899 Other long term (current) drug therapy: Secondary | ICD-10-CM | POA: Insufficient documentation

## 2017-02-28 DIAGNOSIS — I517 Cardiomegaly: Secondary | ICD-10-CM | POA: Diagnosis not present

## 2017-02-28 DIAGNOSIS — D500 Iron deficiency anemia secondary to blood loss (chronic): Secondary | ICD-10-CM

## 2017-02-28 DIAGNOSIS — F329 Major depressive disorder, single episode, unspecified: Secondary | ICD-10-CM | POA: Diagnosis not present

## 2017-02-28 DIAGNOSIS — I110 Hypertensive heart disease with heart failure: Secondary | ICD-10-CM | POA: Diagnosis not present

## 2017-02-28 DIAGNOSIS — F419 Anxiety disorder, unspecified: Secondary | ICD-10-CM | POA: Insufficient documentation

## 2017-02-28 DIAGNOSIS — D649 Anemia, unspecified: Secondary | ICD-10-CM | POA: Diagnosis not present

## 2017-02-28 DIAGNOSIS — Z7901 Long term (current) use of anticoagulants: Secondary | ICD-10-CM | POA: Insufficient documentation

## 2017-02-28 DIAGNOSIS — I509 Heart failure, unspecified: Secondary | ICD-10-CM | POA: Diagnosis not present

## 2017-02-28 DIAGNOSIS — Z85118 Personal history of other malignant neoplasm of bronchus and lung: Secondary | ICD-10-CM | POA: Diagnosis not present

## 2017-02-28 DIAGNOSIS — I5030 Unspecified diastolic (congestive) heart failure: Secondary | ICD-10-CM | POA: Diagnosis not present

## 2017-02-28 DIAGNOSIS — J441 Chronic obstructive pulmonary disease with (acute) exacerbation: Secondary | ICD-10-CM | POA: Diagnosis not present

## 2017-02-28 DIAGNOSIS — R0600 Dyspnea, unspecified: Secondary | ICD-10-CM | POA: Insufficient documentation

## 2017-02-28 DIAGNOSIS — R0602 Shortness of breath: Secondary | ICD-10-CM | POA: Diagnosis not present

## 2017-02-28 DIAGNOSIS — K219 Gastro-esophageal reflux disease without esophagitis: Secondary | ICD-10-CM | POA: Diagnosis not present

## 2017-02-28 LAB — URINALYSIS, ROUTINE W REFLEX MICROSCOPIC
BACTERIA UA: NONE SEEN
BILIRUBIN URINE: NEGATIVE
Glucose, UA: NEGATIVE mg/dL
Ketones, ur: NEGATIVE mg/dL
Nitrite: NEGATIVE
Protein, ur: 100 mg/dL — AB
SPECIFIC GRAVITY, URINE: 1.013 (ref 1.005–1.030)
pH: 8 (ref 5.0–8.0)

## 2017-02-28 LAB — COMPREHENSIVE METABOLIC PANEL
ALBUMIN: 3.4 g/dL — AB (ref 3.5–5.0)
ALK PHOS: 43 U/L (ref 38–126)
ALT: 10 U/L — AB (ref 14–54)
AST: 21 U/L (ref 15–41)
Anion gap: 8 (ref 5–15)
BILIRUBIN TOTAL: 0.8 mg/dL (ref 0.3–1.2)
BUN: 19 mg/dL (ref 6–20)
CO2: 33 mmol/L — ABNORMAL HIGH (ref 22–32)
CREATININE: 0.98 mg/dL (ref 0.44–1.00)
Calcium: 9 mg/dL (ref 8.9–10.3)
Chloride: 99 mmol/L — ABNORMAL LOW (ref 101–111)
GFR calc Af Amer: >60 mL/min (ref >60)
GFR calc non Af Amer: 52 mL/min — ABNORMAL LOW (ref >60)
GLUCOSE: 95 mg/dL (ref 65–99)
POTASSIUM: 4.3 mmol/L (ref 3.5–5.1)
Sodium: 140 mmol/L (ref 135–145)
TOTAL PROTEIN: 6 g/dL — AB (ref 6.5–8.1)

## 2017-02-28 LAB — CBC
HEMATOCRIT: 26.9 % — AB (ref 36.0–46.0)
Hemoglobin: 7.9 g/dL — ABNORMAL LOW (ref 12.0–15.0)
MCH: 26.7 pg (ref 26.0–34.0)
MCHC: 29.4 g/dL — AB (ref 30.0–36.0)
MCV: 90.9 fL (ref 78.0–100.0)
PLATELETS: 184 10*3/uL (ref 150–400)
RBC: 2.96 MIL/uL — ABNORMAL LOW (ref 3.87–5.11)
RDW: 16.2 % — AB (ref 11.5–15.5)
WBC: 5.2 10*3/uL (ref 4.0–10.5)

## 2017-02-28 LAB — BRAIN NATRIURETIC PEPTIDE: B Natriuretic Peptide: 333.8 pg/mL — ABNORMAL HIGH (ref 0.0–100.0)

## 2017-02-28 LAB — PREPARE RBC (CROSSMATCH)

## 2017-02-28 MED ORDER — ENSURE ENLIVE PO LIQD
237.0000 mL | Freq: Once | ORAL | Status: AC
  Administered 2017-02-28: 237 mL via ORAL
  Filled 2017-02-28: qty 237

## 2017-02-28 MED ORDER — SODIUM CHLORIDE 0.9 % IV SOLN
10.0000 mL/h | Freq: Once | INTRAVENOUS | Status: AC
  Administered 2017-02-28: 10 mL/h via INTRAVENOUS

## 2017-02-28 NOTE — Telephone Encounter (Signed)
Patient's daughter notified of appt for transfusion at the [facility] clinic for 03/03/17 8:00.  Collie Siad verbalized understanding that her mom will be at the clinic all day

## 2017-02-28 NOTE — ED Notes (Signed)
Patient transported to X-ray 

## 2017-02-28 NOTE — ED Provider Notes (Signed)
[facility] DE[facility] Provider Note   CSN: [id number] Arrival date & time: 02/28/17  1502     History   Chief Complaint Chief Complaint  Patient presents with  . Abnormal Lab    HPI Gwendoline Judy is a 83 y.o. female.  Patient with anemia, atrial fibrillation, congestive heart failure, bronchitis/COPD, history of blood transfusion, lung cancer, pneumonia presents with fatigue and shortness of breath worse with exertion. Feels similar to when she had blood transfusion the past. Patient has dark stools in her ostomy bag. Patient has had 5 blood transfusions recently for GI bleed for which they cannot find a specific source. Patient has a video capsule test pending. Patient is on 2 L nasal cannula time is at baseline. Chest pain. Patient was on anticoagulation but held due to GI bleeding and procedures recently.      Past Medical History:  Diagnosis Date  . 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 Anemia   . Anxiety   . Atrial fibrillation ([place])   . Basal cell carcinoma of skin of nose   . CHF (congestive heart failure) ([place]) 11/2015  . Chronic bronchitis ([place])   . Chronic depression   . Colostomy in place [facility]) 07/2016  . Diverticular disease   . Fibrocystic disease of breast   . Fracture of shoulder   . GERD (gastroesophageal reflux disease)   . GI bleed 12/10/2016   in setting of no PPI. 5/2 EGD: grade A reflux esophagitis.  Mild, non-hemorrhagic gastritis.  Ablation of 3 Duodenal AVMs  . Herpes zoster   . History of blood transfusion    for vaginal, uterine bleeding leading to hysterectomy.  in 12/2016 transfused x 1 for GI bleed.   Marland Kitchen History of hiatal hernia   . Hyperlipidemia   . Hypertension   . Labyrinthitis   . 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 Obesity   . Paroxysmal supraventricular tachycardia ([place])   . Peptic ulcer of stomach    hx  . Pneumonia 1980s X 1; 08/2016   "walking; double"  . Pulmonary embolism ([place]) 05/2014     "left lung"  . Vitamin D deficiency     Patient Active Problem List   Diagnosis Date Noted  . Venous stasis dermatitis of both lower extremities 02/23/2017  . Anemia 02/22/2017  . Acid reflux 02/22/2017  . Colostomy care ([place]) 12/20/2016  . Angiodysplasia of duodenum   . GI bleeding 12/10/2016  . Heme positive stool 12/10/2016  . Mixed hyperlipidemia 11/05/2016  . Chronic bronchitis ([place]) 11/04/2016  . Persistent atrial fibrillation ([place]) 10/07/2016  . HCAP (healthcare-associated pneumonia) 08/26/2016  . Acute respiratory failure with hypoxia ([place]) 08/26/2016  . Essential hypertension   . Lung nodule   . Palliative care encounter   . Symptomatic anemia 07/22/2016  . Anal cancer ([place]) 07/22/2016  . BRBPR (bright red blood per rectum) 07/22/2016  . Elevated troponin 07/22/2016  . Atrial fibrillation ([place]), CHA2DS2-VASc Score 5 07/22/2016  . Pulmonary embolus ([place])   . Obesity   . Lung cancer ([place])   . Hypertension     Past Surgical History:  Procedure Laterality Date  . APPENDECTOMY  1959  . BASAL CELL CARCINOMA EXCISION     nose  . BREAST CYST ASPIRATION Right 2016?  Marland Kitchen CATARACT EXTRACTION W/ INTRAOCULAR LENS  IMPLANT, BILATERAL Bilateral 1990s  . CHOLECYSTECTOMY OPEN  1980s  . COLOSTOMY N/A 07/29/2016   Procedure: COLOSTOMY;  Surgeon:  Clovis Riley, MD;  Location: [facility];  Service: General;  Laterality: N/A;  . DILATION AND CURETTAGE OF UTERUS    . ESOPHAGOGASTRODUODENOSCOPY N/A 12/11/2016   Procedure: ESOPHAGOGASTRODUODENOSCOPY (EGD);  Surgeon: Jerene Bears, MD;  Location: [facility] ENDOSCOPY;  Service: Endoscopy;  Laterality: N/A;  . EVALUATION UNDER ANESTHESIA WITH HEMORRHOIDECTOMY AND PROCTOSCOPY N/A 07/25/2016   Procedure: EXAM UNDER ANESTHESIA, EXCISION PERIANAL MASS.;  Surgeon: Judeth Horn, MD;  Location: [place];  Service: General;  Laterality: N/A;  Prone position  . LAPAROSCOPIC DIVERTED COLOSTOMY N/A 07/29/2016   Procedure: ATTEMPTED LAPAROSCOPIC ASSISTED  COLOSTOMY;  Surgeon: Clovis Riley, MD;  Location: [facility];  Service: General;  Laterality: N/A;  . LUNG REMOVAL, PARTIAL Right 2013   RML mass/carcinoid, Dr Lurena Nida  . TONSILLECTOMY AND ADENOIDECTOMY  1960s  . VAGINAL HYSTERECTOMY  1959   "partial"    OB History    No data available       Home Medications    Prior to Admission medications   Medication Sig Start Date End Date Taking? Authorizing Provider  apixaban (ELIQUIS) 5 MG TABS tablet Take 1 tablet (5 mg total) by mouth 2 (two) times daily. 12/02/16  Yes Evans Lance, MD  arformoterol (BROVANA) 15 MCG/2ML NEBU Take 2 mLs (15 mcg total) by nebulization 2 (two) times daily. Dx copd: J44.9 02/25/17  Yes Mannam, Praveen, MD  atorvastatin (LIPITOR) 20 MG tablet Take 1 tablet (20 mg total) by mouth daily. 11/04/16  Yes [name], [name], NP  budesonide (PULMICORT) 0.5 MG/2ML nebulizer solution Take 2 mLs (0.5 mg total) by nebulization 2 (two) times daily. Dx code: j44.9 02/25/17  Yes Mannam, Praveen, MD  Calcium 600-200 MG-UNIT tablet Take 1 tablet by mouth daily.   Yes [provider]  dofetilide (TIKOSYN) 500 MCG capsule Take 1 capsule (500 mcg total) by mouth 2 (two) times daily. 10/10/16  Yes Seiler, Amber K, NP  ENSURE PLUS (ENSURE PLUS) LIQD Take 237 mLs by mouth daily.    Yes [provider]  ergocalciferol (VITAMIN D2) 50000 units capsule Take 50,000 Units by mouth once a week.   Yes [provider]  fenofibrate 160 MG tablet Take 1 tablet (160 mg total) by mouth daily. 11/04/16  Yes [name], [name], NP  ferrous sulfate 325 (65 FE) MG tablet Take 1 tablet (325 mg total) by mouth daily with breakfast. Patient taking differently: Take 650 mg by mouth daily with breakfast.  11/05/16  Yes [name], [name], NP  fluticasone (FLONASE) 50 MCG/ACT nasal spray Place 1 spray into both nostrils daily.   Yes [provider]  furosemide (LASIX) 40 MG tablet Take 1 tablet (40 mg total) by mouth daily.  For 3 days and then back to 20 mg daily 02/23/17  Yes Rizwan, Eunice Blase, MD  gabapentin (NEURONTIN) 300 MG capsule 300 mg 3 (three) times daily.  02/20/17  Yes [provider]  ipratropium-albuterol (DUONEB) 0.5-2.5 (3) MG/3ML SOLN Take 3 mLs by nebulization 2 (two) times daily. 11/14/16  Yes Mannam, Praveen, MD  magnesium oxide (MAG-OX) 400 (241.3 Mg) MG tablet Take 1 tablet (400 mg total) by mouth daily. 11/12/16  Yes Evans Lance, MD  metoprolol tartrate (LOPRESSOR) 25 MG tablet Take 0.5 tablets (12.5 mg total) by mouth 2 (two) times daily. 01/13/17 04/13/17 Yes Evans Lance, MD  montelukast (SINGULAIR) 10 MG tablet Take 1 tablet (10 mg total) by mouth at bedtime. 11/04/16  Yes [name], [name], NP  pantoprazole (PROTONIX) 40 MG  tablet Take 1 tablet (40 mg total) by mouth daily. 12/16/16  Yes Domenic Polite, MD  PARoxetine (PAXIL) 20 MG tablet Take 20 mg by mouth at bedtime.    Yes [provider]  Ostomy Supplies KIT [phone] is number on bag one piece unit. Colostomy bag and adhesive and alcohol prep, no sting, cannot use regular prep pad. 12/20/16   [name], [name], NP  senna-docusate (SENOKOT-S) 8.6-50 MG tablet Take 1 tablet by mouth at bedtime as needed for mild constipation. Patient not taking: Reported on 02/25/2017 12/15/16   Domenic Polite, MD    Family History Family History  Problem Relation Age of Onset  . Heart attack Mother        Dec 1987  . CVA Mother   . Hypertension Mother   . Multiple myeloma Mother   . Breast cancer Sister   . Skin cancer Sister   . Prostate cancer Brother   . Throat cancer Brother   . Cervical cancer Daughter   . Leukemia Daughter   . Leukemia Son   . Throat cancer Brother   . Cancer Brother        soft palette  . Heart Problems Brother        Pacemaker  . Colon cancer Neg Hx   . Pancreatic cancer Neg Hx     Social History Social History  Substance Use Topics  . Smoking status: Former Smoker    Packs/day: 0.50    Years:  15.00    Types: Cigarettes    Quit date: 1976  . Smokeless tobacco: Never Used  . Alcohol use No     Allergies   Penicillins; Aleve [naproxen sodium]; Antihistamines, chlorpheniramine-type; Aspirin; Benadryl [diphenhydramine hcl]; Codeine; Hydrochlorothiazide; Rocephin [ceftriaxone sodium in dextrose]; and Sulfa antibiotics   Review of Systems Review of Systems  Constitutional: Positive for fatigue. Negative for chills and fever.  HENT: Negative for congestion.   Eyes: Negative for visual disturbance.  Respiratory: Positive for shortness of breath.   Cardiovascular: Negative for chest pain.  Gastrointestinal: Positive for blood in stool. Negative for abdominal pain and vomiting.  Genitourinary: Negative for dysuria and flank pain.  Musculoskeletal: Negative for back pain, neck pain and neck stiffness.  Skin: Negative for rash.  Neurological: Negative for light-headedness and headaches.     Physical Exam Updated Vital Signs BP 107/70   Pulse 85   Temp 99.1 F (37.3 C) (Oral)   Resp 20   Ht _0  (1.626 m)   Wt 113.4 kg (250 lb)   SpO2 100%   BMI 42.91 kg/m   Physical Exam  Constitutional: She is oriented to person, place, and time. She appears well-developed and well-nourished.  HENT:  Head: Normocephalic and atraumatic.  Eyes: Conjunctivae are normal. Right eye exhibits no discharge. Left eye exhibits no discharge.  Neck: Normal range of motion. Neck supple. No tracheal deviation present.  Cardiovascular: Normal rate and regular rhythm.   Pulmonary/Chest: Effort normal. She has rales (few base left).  Abdominal: Soft. She exhibits no distension. There is no tenderness. There is no guarding.  Patient has dark stools in the ostomy bag.  Musculoskeletal: She exhibits edema (mild bilateral le).  Neurological: She is alert and oriented to person, place, and time.  Skin: Skin is warm. No rash noted. There is pallor.  Psychiatric: She has a normal mood and affect.   Nursing note and vitals reviewed.    ED Treatments / Results  Labs (all labs ordered are listed, but  only abnormal results are displayed) Labs Reviewed  COMPREHENSIVE METABOLIC PANEL - Abnormal; Notable for the following:       Result Value   Chloride 99 (*)    CO2 33 (*)    Total Protein 6.0 (*)    Albumin 3.4 (*)    ALT 10 (*)    GFR calc non Af Amer 52 (*)    All other components within normal limits  CBC - Abnormal; Notable for the following:    RBC 2.96 (*)    Hemoglobin 7.9 (*)    HCT 26.9 (*)    MCHC 29.4 (*)    RDW 16.2 (*)    All other components within normal limits  BRAIN NATRIURETIC PEPTIDE - Abnormal; Notable for the following:    B Natriuretic Peptide 333.8 (*)    All other components within normal limits  TROPONIN I  URINALYSIS, ROUTINE W REFLEX MICROSCOPIC  POC OCCULT BLOOD, ED  POC OCCULT BLOOD, ED  TYPE AND SCREEN  PREPARE RBC (CROSSMATCH)    EKG  EKG Interpretation None       Radiology Dg Chest Port 1 View  Result Date: 02/28/2017 CLINICAL DATA:  Anemia. History of gastrointestinal bleeding. Weakness and fatigue. EXAM: PORTABLE CHEST 1 VIEW COMPARISON:  02/23/2017.  Multiple previous chest exams. FINDINGS: Chronic cardiomegaly. Chronic pleural and parenchymal pulmonary scarring. Possible small effusions. No sign of active infiltrate, consolidation or lobar collapse. No acute bone finding. IMPRESSION: Cardiomegaly. Chronic lung disease. Possible small effusions. No other active disease identified. Electronically Signed   By: Nelson Chimes M.D.   On: 02/28/2017 19:46    Procedures Procedures (including critical care time)  Medications Ordered in ED Medications  feeding supplement (ENSURE ENLIVE) (ENSURE ENLIVE) liquid 237 mL (not administered)  0.9 %  sodium chloride infusion (10 mL/hr Intravenous New Bag/Given 02/28/17 1846)     Initial Impression / Assessment and Plan / ED Course  I have reviewed the triage vital signs and the nursing  notes.  Pertinent labs & imaging results that were available during my care of the patient were reviewed by me and considered in my medical decision making (see chart for details).    Patient presents shortness of breath and clinical concern for symptomatic anemia. Hemoglobin 7.9 and patient has been around 8 for the past 2 months however has received blood transfusions intermittently at outside hospital. Patient's unsure what her actual baseline is. BP borderline on arrival.  Patient does have a few crackles on exam plan for 1 unit of packed red blood cells given very slowly if needed or bp drops.  Plan for chest x-ray and cardiac workup to look for signs of heart failure. Patient not baseline 2 L nasal cannula. Plan for admission to the hospitalist. Patient's primary doctor from [place]. Hospitalist discussed options with patient and family. They have close follow-up outpatient and hemoglobin is near their baseline. They prefer to follow-up with gastroenterology and primary doctor in the office and will return for blood transfusion if worsening symptoms. The patients results and plan were reviewed and discussed.   Any x-rays performed were independently reviewed by myself.   Differential diagnosis were considered with the presenting HPI.  Medications  feeding supplement (ENSURE ENLIVE) (ENSURE ENLIVE) liquid 237 mL (not administered)  0.9 %  sodium chloride infusion (10 mL/hr Intravenous New Bag/Given 02/28/17 1846)    Vitals:   02/28/17 1845 02/28/17 1900 02/28/17 1915 02/28/17 2030  BP: (!) 108/58  (!) 120/48 107/70  Pulse: (!) 112  60 60 85  Resp: _0 Temp:      TempSrc:      SpO2: 100% 100% 100% 100%  Weight:      Height:        Final diagnoses:  Symptomatic anemia  Dyspnea    Admission/ observation were discussed with the admitting physician, patient and/or family and they are comfortable with the plan.    Final Clinical Impressions(s) / ED Diagnoses   Final  diagnoses:  Symptomatic anemia  Dyspnea    New Prescriptions New Prescriptions   No medications on file     Elnora Morrison, MD 02/28/17 2055

## 2017-02-28 NOTE — Consult Note (Signed)
Hospitalist Service Medical Consultation   Tricia Hernandez  ZSM:[id number]  DOB: [birth year]  DOA: 02/28/2017  PCP: Flossie Buffy, NP   Outpatient Specialists:  Dr. Carlean Purl, GI     Dr. Vaughan Browner, Pulm     Dr. Lindi Adie, Onc   Requesting physician: Dr. Reather Converse  Reason for consultation: Anemia   History of Present Illness: Tricia Hernandez is an 83 y.o. female with history of suspected metaststic rectal cancer ECOG 1, chron blood loss anemia followed by Dr. Carlean Purl, Afib off Eliquis for now, on Tikosyn, hx of lung carcinoid, COPD recently started O2, HFpEF and obesity who presents with abnormal lab.  The patient has a CP LA, followed by Dr. Carlean Purl, thinks she has received about 5 transfusions in the last 5 months (here and at [facility]), has had negative endoscopies, has plans for capsule endoscopy next week.  She was just admitted for dyspnea and leg swelling last weekend, got a few doses of IV Lasix, discharged on three days of doubled Lasix, feels considerably better.  The past few days she has been feeling fine, ambulating at home independently, (actually has been back to her home this week, where she has family around periodicaly through the day but is alone at night,  for the first time in several months from rehab).  At discharge her Hgb was 8.1, but this week she had an outside Hgb measured at 7.1 g/dL.  Per GI clinic notes, this was shared with Dr. Carlean Purl who scheduled her for 2 units PRBCs on Monday before her capsule study on Tuesday.  Told her to come in if symptoms were worse.    Today, a HHRN came to the house, told her that she needed to be evaluated emergently for this Hgb (unclear if nurse knew she had transfusion already addressed).  Patient became alarmed, started to have tingling in her shoulders, and feel panicked and have shortness of breath, called her son and daughter to take her to the ER emergently.  Patient is clear to me that she had been fine  symptomatically, in fact better this week than she had been.  Had had no hematochezia, melena.    ED course: -Temp 99.1 F, heart rate 58, respirations and pulse ox normal on home O2, blood pressure 116/51 -Sodium 140, potassium 4.3, creatinine 0.98, WBC 5.2K, hemoglobin 7.9 -BNP 333 -Bicarbonate 33 -Transaminases normal -Chest x-ray showed no edema -TRH were asked to evaluate for anemia   Review of Systems:  As per HPI otherwise 10 systems were reviewed and were negative.   Past Medical History: Past Medical History:  Diagnosis Date  . 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 Anemia   . Anxiety   . Atrial fibrillation ([place])   . Basal cell carcinoma of skin of nose   . CHF (congestive heart failure) ([place]) 11/2015  . Chronic bronchitis ([place])   . Chronic depression   . Colostomy in place [facility]) 07/2016  . Diverticular disease   . Fibrocystic disease of breast   . Fracture of shoulder   . GERD (gastroesophageal reflux disease)   . GI bleed 12/10/2016   in setting of no PPI. 5/2 EGD: grade A reflux esophagitis.  Mild, non-hemorrhagic gastritis.  Ablation of 3 Duodenal AVMs  . Herpes zoster   . History of blood transfusion    for vaginal, uterine bleeding leading to hysterectomy.  in 12/2016 transfused x 1 for GI bleed.   Marland Kitchen History of hiatal hernia   . Hyperlipidemia   . Hypertension   . Labyrinthitis   . Lung cancer ([place])    RML  . Obesity   . Paroxysmal supraventricular tachycardia ([place])   . Peptic ulcer of stomach    hx  . Pneumonia 1980s X 1; 08/2016   "walking; double"  . Pulmonary embolism ([place]) 05/2014   "left lung"  . Vitamin D deficiency     Past Surgical History: Past Surgical History:  Procedure Laterality Date  . APPENDECTOMY  1959  . BASAL CELL CARCINOMA EXCISION     nose  . BREAST CYST ASPIRATION Right 2016?  Marland Kitchen CATARACT EXTRACTION W/ INTRAOCULAR LENS  IMPLANT, BILATERAL Bilateral 1990s  . CHOLECYSTECTOMY  OPEN  1980s  . COLOSTOMY N/A 07/29/2016   Procedure: COLOSTOMY;  Surgeon: Clovis Riley, MD;  Location: [place];  Service: General;  Laterality: N/A;  . DILATION AND CURETTAGE OF UTERUS    . ESOPHAGOGASTRODUODENOSCOPY N/A 12/11/2016   Procedure: ESOPHAGOGASTRODUODENOSCOPY (EGD);  Surgeon: Jerene Bears, MD;  Location: [facility] ENDOSCOPY;  Service: Endoscopy;  Laterality: N/A;  . EVALUATION UNDER ANESTHESIA WITH HEMORRHOIDECTOMY AND PROCTOSCOPY N/A 07/25/2016   Procedure: EXAM UNDER ANESTHESIA, EXCISION PERIANAL MASS.;  Surgeon: Judeth Horn, MD;  Location: [place];  Service: General;  Laterality: N/A;  Prone position  . LAPAROSCOPIC DIVERTED COLOSTOMY N/A 07/29/2016   Procedure: ATTEMPTED LAPAROSCOPIC ASSISTED COLOSTOMY;  Surgeon: Clovis Riley, MD;  Location: [place];  Service: General;  Laterality: N/A;  . LUNG REMOVAL, PARTIAL Right 2013   RML mass/carcinoid, Dr Lurena Nida  . TONSILLECTOMY AND ADENOIDECTOMY  1960s  . VAGINAL HYSTERECTOMY  1959   "partial"     Allergies:   Allergies  Allergen Reactions  . Penicillins Anaphylaxis    Reaction:  Unknown  Has patient had a PCN reaction causing immediate rash, facial/tongue/throat swelling, SOB or lightheadedness with hypotension: Unsure Has patient had a PCN reaction causing severe rash involving mucus membranes or skin necrosis: Unsure Has patient had a PCN reaction that required hospitalization Unsure Has patient had a PCN reaction occurring within the last 10 years: Unsure If all of the above answers are "NO", then may proceed with Cephalosporin use.   [name] [Naproxen Sodium] Hives  . Antihistamines, Chlorpheniramine-Type Rash    Reaction:  Unknown   . Aspirin Rash  . Benadryl [Diphenhydramine Hcl] Palpitations    Increases heart rate  . Codeine Rash  . Hydrochlorothiazide Rash  . Rocephin [Ceftriaxone Sodium In Dextrose] Rash  . Sulfa Antibiotics Rash     Social History:  reports that she quit smoking about 42 years ago. Her  smoking use included Cigarettes. She has a 7.50 pack-year smoking history. She has never used smokeless tobacco. She reports that she does not drink alcohol or use drugs.   Family History: Family History  Problem Relation Age of Onset  . Heart attack Mother        Dec 1987  . CVA Mother   . Hypertension Mother   . Multiple myeloma Mother   . Breast cancer Sister   . Skin cancer Sister   . Prostate cancer Brother   . Throat cancer Brother   . Cervical cancer Daughter   . Leukemia Daughter   . Leukemia Son   . Throat cancer Brother   . Cancer Brother        soft palette  . Heart Problems Brother  Pacemaker  . Colon cancer Neg Hx   . Pancreatic cancer Neg Hx      Physical Exam: Vitals:   02/28/17 1837 02/28/17 1845 02/28/17 1900 02/28/17 1915  BP: (!) 116/50 (!) 108/58  (!) 120/48  Pulse:  (!) 112 60 60  Resp: 17 18 19 19   Temp:      TempSrc:      SpO2:  100% 100% 100%  Weight:      Height:        Constitutional: Alert and awake, obese elderly female, pleasant, oriented x3, not in any acute distress. Eyes: PERLA, EOMI, irises appear normal, anicteric sclera,  ENMT: external ears and nose appear normal, hearing normal            Lips appears normal, oropharynx mucosa, tongue, posterior pharynx appear normal  Neck: neck appears normal, no masses, normal ROM, no thyromegaly, no JVD, JV pulse not visble due to habitus  CVS: S1-S2 clear, no murmur rubs or gallops, minimal trace LE edema, normal pedal pulses, no carotid bruits Respiratory:  clear to auscultation bilaterally, no wheezing, rales or rhonchi.  Slight atelectasis and dimihshed tthroughout. Respiratory effort normal. No accessory muscle use.  GI: soft nontender, nondistended, normal bowel sounds, no hepatosplenomegaly, no hernias  Musculoskeletal: no cyanosis, clubbing or edema noted bilaterally Neuro: Cranial nerves II-XII intact, strength equal and normal Psych: judgement and insight appear normal, stable  mood and affect, mental status Skin: no rashes or lesions or ulcers, no induration or nodules    Data reviewed:  I have personally reviewed following labs and imaging studies Labs:  CBC:  Recent Labs Lab 02/22/17 0454 02/28/17 1600  WBC 6.2 5.2  HGB 8.2* 7.9*  HCT 28.4* 26.9*  MCV 94.4 90.9  PLT 198 409    Basic Metabolic Panel:  Recent Labs Lab 02/22/17 0454 02/23/17 0354 02/28/17 1600  NA 141 141 140  K 4.2 3.6 4.3  CL 98* 95* 99*  CO2 37* 40* 33*  GLUCOSE 140* 108* 95  BUN 18 19 19   CREATININE 0.94 1.11* 0.98  CALCIUM 9.3 8.7* 9.0   GFR Estimated Creatinine Clearance: 53.7 mL/min (by C-G formula based on SCr of 0.98 mg/dL). Liver Function Tests:  Recent Labs Lab 02/28/17 1600  AST 21  ALT 10*  ALKPHOS 43  BILITOT 0.8  PROT 6.0*  ALBUMIN 3.4*   Cardiac Enzymes:  Recent Labs Lab 02/22/17 0529  TROPONINI 0.10*   Urinalysis    Component Value Date/Time   COLORURINE COLORLESS (A) 12/11/2016 1750   APPEARANCEUR CLEAR 12/11/2016 1750   LABSPEC 1.004 (L) 12/11/2016 1750   LABSPEC 1.025 09/23/2016 1531   PHURINE 6.0 12/11/2016 1750   GLUCOSEU NEGATIVE 12/11/2016 1750   GLUCOSEU Negative 09/23/2016 1531   HGBUR NEGATIVE 12/11/2016 1750   BILIRUBINUR NEGATIVE 12/11/2016 1750   BILIRUBINUR Negative 09/23/2016 1531   KETONESUR NEGATIVE 12/11/2016 1750   PROTEINUR NEGATIVE 12/11/2016 1750   UROBILINOGEN 0.2 09/23/2016 1531   NITRITE NEGATIVE 12/11/2016 1750   LEUKOCYTESUR LARGE (A) 12/11/2016 1750   LEUKOCYTESUR Moderate 09/23/2016 1531     Microbiology Recent Results (from the past 240 hour(s))  MRSA PCR Screening     Status: None   Collection Time: 02/22/17 12:44 PM  Result Value Ref Range Status   MRSA by PCR NEGATIVE NEGATIVE Final    Comment:        The GeneXpert MRSA Assay (FDA approved for NASAL specimens only), is one component of a comprehensive MRSA colonization surveillance program. It  is not intended to diagnose  MRSA infection nor to guide or monitor treatment for MRSA infections.        Inpatient Medications:   Scheduled Meds: . feeding supplement (ENSURE ENLIVE)  237 mL Oral Once   Continuous Infusions:   Radiological Exams on Admission: CXR was personally reviewed and showed no edema, shows chronic interstitial markings and scarring. Dg Chest Port 1 View  Result Date: 02/28/2017 CLINICAL DATA:  Anemia. History of gastrointestinal bleeding. Weakness and fatigue. EXAM: PORTABLE CHEST 1 VIEW COMPARISON:  02/23/2017.  Multiple previous chest exams. FINDINGS: Chronic cardiomegaly. Chronic pleural and parenchymal pulmonary scarring. Possible small effusions. No sign of active infiltrate, consolidation or lobar collapse. No acute bone finding. IMPRESSION: Cardiomegaly. Chronic lung disease. Possible small effusions. No other active disease identified. Electronically Signed   By: Nelson Chimes M.D.   On: 02/28/2017 19:46    Echocardiogram May 2018: Report reviewed Mild LVH EF 55-60% PAP 39     Impression/Recommendations 1. Anemia: Asymptomatic.  It appears her [facility] was unaware that this was being addressed already.  Patient has no symptoms of fluid overload, feels at her baseline, desires to go home.  Transfusion is already arranged on Monday, do not anticipate that this will interfere with her capsule study on Tuesday. -Return precautions given    Thank you for this consultation.      Edwin Dada M.D. [facility] 02/28/2017, 8:48 PM

## 2017-02-28 NOTE — Discharge Instructions (Signed)
Follow-up closely with gastroenterologist and your primary doctor. If he develop worsening bleeding, worsening symptoms return to the emergency department for blood transfusion.

## 2017-02-28 NOTE — ED Triage Notes (Signed)
Pt reports being sent here today for Hgb < 8.0. Pt has hx of anemia/gi bleed and just had transfusions one week ago. Has weakness/fatigue with exertion. No acute distress is noted at triage.

## 2017-03-03 ENCOUNTER — Ambulatory Visit (HOSPITAL_COMMUNITY)
Admission: RE | Admit: 2017-03-03 | Discharge: 2017-03-03 | Disposition: A | Discharge status: Home / Self Care | Payer: Medicare Other | Source: Ambulatory Visit | Attending: Hematology and Oncology | Admitting: Hematology and Oncology

## 2017-03-03 DIAGNOSIS — D500 Iron deficiency anemia secondary to blood loss (chronic): Secondary | ICD-10-CM

## 2017-03-03 DIAGNOSIS — D649 Anemia, unspecified: Secondary | ICD-10-CM | POA: Diagnosis not present

## 2017-03-03 LAB — BPAM RBC
Blood Product Expiration Date: 201807282359
ISSUE DATE / TIME: 201807091308
UNIT TYPE AND RH: 5100

## 2017-03-03 LAB — TYPE AND SCREEN
ABO/RH(D): O POS
ANTIBODY SCREEN: NEGATIVE
UNIT DIVISION: 0

## 2017-03-03 LAB — PREPARE RBC (CROSSMATCH)

## 2017-03-03 MED ORDER — SODIUM CHLORIDE 0.9 % IV SOLN
Freq: Once | INTRAVENOUS | Status: AC
Start: 2017-03-03 — End: 2017-03-03
  Administered 2017-03-03: 10:00:00 via INTRAVENOUS

## 2017-03-03 MED ORDER — FUROSEMIDE 10 MG/ML IJ SOLN
40.0000 mg | Freq: Once | INTRAMUSCULAR | Status: AC
  Administered 2017-03-03: 40 mg via INTRAVENOUS
  Filled 2017-03-03: qty 4

## 2017-03-03 NOTE — Discharge Instructions (Signed)

## 2017-03-03 NOTE — Progress Notes (Signed)
Provider: Dr. Lindi Adie  Associated Diagnosis: Anemia, unspecified type  Treatment: 2 units PRBC's via IVPB  Patient tolerated procedure well with no transfusion reaction. Discharge instructions given to patient and patient states an understanding. Patient alert, oriented, and ambulatory to wheelchair at time of discharge. Patient's daughter at bedside.

## 2017-03-04 ENCOUNTER — Encounter: Payer: Self-pay | Admitting: Internal Medicine

## 2017-03-04 ENCOUNTER — Ambulatory Visit (INDEPENDENT_AMBULATORY_CARE_PROVIDER_SITE_OTHER): Payer: Medicare Other | Admitting: Internal Medicine

## 2017-03-04 DIAGNOSIS — K5520 Angiodysplasia of colon without hemorrhage: Secondary | ICD-10-CM | POA: Diagnosis not present

## 2017-03-04 DIAGNOSIS — D500 Iron deficiency anemia secondary to blood loss (chronic): Secondary | ICD-10-CM | POA: Diagnosis not present

## 2017-03-04 LAB — TYPE AND SCREEN
ABO/RH(D): O POS
ANTIBODY SCREEN: NEGATIVE
UNIT DIVISION: 0
Unit division: 0

## 2017-03-04 LAB — BPAM RBC
BLOOD PRODUCT EXPIRATION DATE: 201808172359
Blood Product Expiration Date: 201808172359
ISSUE DATE / TIME: 201807230922
ISSUE DATE / TIME: 201807230922
UNIT TYPE AND RH: 5100
Unit Type and Rh: 5100

## 2017-03-04 NOTE — Progress Notes (Signed)
Patient here for capsule endoscopy. Tolerated procedure. Verbalizes understanding of written and verbal instructions.  YSH#[id number] Cap ID# [id number] Exp date: 01-13-2018

## 2017-03-05 DIAGNOSIS — I481 Persistent atrial fibrillation: Secondary | ICD-10-CM | POA: Diagnosis not present

## 2017-03-05 DIAGNOSIS — I110 Hypertensive heart disease with heart failure: Secondary | ICD-10-CM | POA: Diagnosis not present

## 2017-03-05 DIAGNOSIS — K219 Gastro-esophageal reflux disease without esophagitis: Secondary | ICD-10-CM | POA: Diagnosis not present

## 2017-03-05 DIAGNOSIS — F329 Major depressive disorder, single episode, unspecified: Secondary | ICD-10-CM | POA: Diagnosis not present

## 2017-03-05 DIAGNOSIS — I5030 Unspecified diastolic (congestive) heart failure: Secondary | ICD-10-CM | POA: Diagnosis not present

## 2017-03-05 DIAGNOSIS — J441 Chronic obstructive pulmonary disease with (acute) exacerbation: Secondary | ICD-10-CM | POA: Diagnosis not present

## 2017-03-06 DIAGNOSIS — I5030 Unspecified diastolic (congestive) heart failure: Secondary | ICD-10-CM | POA: Diagnosis not present

## 2017-03-06 DIAGNOSIS — J441 Chronic obstructive pulmonary disease with (acute) exacerbation: Secondary | ICD-10-CM | POA: Diagnosis not present

## 2017-03-06 DIAGNOSIS — D649 Anemia, unspecified: Secondary | ICD-10-CM | POA: Diagnosis not present

## 2017-03-06 DIAGNOSIS — K219 Gastro-esophageal reflux disease without esophagitis: Secondary | ICD-10-CM | POA: Diagnosis not present

## 2017-03-06 DIAGNOSIS — I110 Hypertensive heart disease with heart failure: Secondary | ICD-10-CM | POA: Diagnosis not present

## 2017-03-06 DIAGNOSIS — I481 Persistent atrial fibrillation: Secondary | ICD-10-CM | POA: Diagnosis not present

## 2017-03-06 DIAGNOSIS — F329 Major depressive disorder, single episode, unspecified: Secondary | ICD-10-CM | POA: Diagnosis not present

## 2017-03-07 ENCOUNTER — Telehealth: Payer: Self-pay

## 2017-03-07 DIAGNOSIS — F329 Major depressive disorder, single episode, unspecified: Secondary | ICD-10-CM | POA: Diagnosis not present

## 2017-03-07 DIAGNOSIS — I110 Hypertensive heart disease with heart failure: Secondary | ICD-10-CM | POA: Diagnosis not present

## 2017-03-07 DIAGNOSIS — I5030 Unspecified diastolic (congestive) heart failure: Secondary | ICD-10-CM | POA: Diagnosis not present

## 2017-03-07 DIAGNOSIS — I481 Persistent atrial fibrillation: Secondary | ICD-10-CM | POA: Diagnosis not present

## 2017-03-07 DIAGNOSIS — J441 Chronic obstructive pulmonary disease with (acute) exacerbation: Secondary | ICD-10-CM | POA: Diagnosis not present

## 2017-03-07 DIAGNOSIS — K219 Gastro-esophageal reflux disease without esophagitis: Secondary | ICD-10-CM | POA: Diagnosis not present

## 2017-03-07 NOTE — Telephone Encounter (Signed)
Patient's daughter notified of the results and recommendations. I spoke with Erasmo Downer at [facility] and she will fax a copy of the labs drawn on 02/04/17

## 2017-03-07 NOTE — Telephone Encounter (Signed)
-----   Message from Gatha Mayer, MD sent at 03/07/2017  6:58 AM EDT ----- Regarding: capsule results Let daughter capsule shows AVM's and an abnormal segment of the small bowel - not sure if that is radiation change vs possible cancer - though former would make more sense.  I need to review with Dr. Lindi Adie and other cancer doctors and surgeons and will do that next week and will plan to have her on the cancer conference Wednesday and get back about it.  I think her blood thinner is accelerating bleeding but there are risks to stopping it - we may be able to have an IVC filter placed to protect the lungs and then more safely stop the blood thinner.  Has she had another CBC after this week's transfusion?  If not please get one - Monday would be ok.  Text/call me if anything urgent on this  Thanks  CEG

## 2017-03-10 ENCOUNTER — Telehealth: Payer: Self-pay

## 2017-03-10 NOTE — Telephone Encounter (Signed)
Called pt Tricia Hernandez to notify her of her appointment with Dr.Gudena next week. Call back number provided to call and confirm that she received this call.

## 2017-03-11 ENCOUNTER — Encounter: Payer: Self-pay | Admitting: Internal Medicine

## 2017-03-11 ENCOUNTER — Telehealth: Payer: Self-pay | Admitting: Pulmonary Disease

## 2017-03-11 ENCOUNTER — Telehealth: Payer: Self-pay | Admitting: Internal Medicine

## 2017-03-11 DIAGNOSIS — C3400 Malignant neoplasm of unspecified main bronchus: Secondary | ICD-10-CM

## 2017-03-11 DIAGNOSIS — J42 Unspecified chronic bronchitis: Secondary | ICD-10-CM

## 2017-03-11 DIAGNOSIS — J9601 Acute respiratory failure with hypoxia: Secondary | ICD-10-CM

## 2017-03-11 NOTE — Telephone Encounter (Signed)
Pt sister calling requesting that we send in Rx for [place]- I advised that this was sent in to [facility] in [place], [place]. Collie Siad states that she has not checked with the pharmacy yet but will call tonight and let us know tomorrow if they were not received by the pharmacy.   Pt wants a smaller tanks and needs an Rx sent to [facility] "Please evaluate patient with M6 tanks with conserving device" Qualifying saturations obtained on 02/25/17  Please advise Dr Vaughan Browner. Thanks.

## 2017-03-11 NOTE — Telephone Encounter (Signed)
OK to send the smaller tanks and send Rx for O2 as requested.  Marshell Garfinkel MD [facility] 03/11/2017, 11:55 PM

## 2017-03-11 NOTE — Telephone Encounter (Signed)
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  She continues to bleed from these areas I think and is requiring frequent transfusions.  Discussed w/ Dr. Lindi Adie and thoughts are to place IVC filter and stop Eliquis.  However she also has A fib - I would think 325 mg ASA worth a try instead of Eliquis.  Will cc Drs. Tyson Dense and Mannam on this to seek out there input but I think this is the best overall approach given recurrent blood loss anemia.

## 2017-03-12 DIAGNOSIS — K219 Gastro-esophageal reflux disease without esophagitis: Secondary | ICD-10-CM | POA: Diagnosis not present

## 2017-03-12 DIAGNOSIS — J441 Chronic obstructive pulmonary disease with (acute) exacerbation: Secondary | ICD-10-CM | POA: Diagnosis not present

## 2017-03-12 DIAGNOSIS — I481 Persistent atrial fibrillation: Secondary | ICD-10-CM | POA: Diagnosis not present

## 2017-03-12 DIAGNOSIS — I5030 Unspecified diastolic (congestive) heart failure: Secondary | ICD-10-CM | POA: Diagnosis not present

## 2017-03-12 DIAGNOSIS — F329 Major depressive disorder, single episode, unspecified: Secondary | ICD-10-CM | POA: Diagnosis not present

## 2017-03-12 DIAGNOSIS — I110 Hypertensive heart disease with heart failure: Secondary | ICD-10-CM | POA: Diagnosis not present

## 2017-03-12 NOTE — Telephone Encounter (Signed)
Called and spoke to pt's daughter, Collie Siad. Informed her of the recs per PM. Order placed with description per DME. Collie Siad verbalized understanding and denied any further questions or concerns at this time.

## 2017-03-12 NOTE — Telephone Encounter (Signed)
The lung PEs were diagnosed in 2014. I don't know the exact circumstances of the embolism as this was in [place] before she moved to [place]. I can contact her previous pulmonologist to get more details but I feel it would be reasonable to use an IVC filter and stop Eliquis given her ongoing GI bleed.  I'll defer the need for anticoagulation for A. fib to Dr. Lovena Le.  Marshell Garfinkel MD [facility] Pager [phone] 03/12/2017, 12:04 AM

## 2017-03-13 ENCOUNTER — Encounter: Payer: Self-pay | Admitting: Internal Medicine

## 2017-03-13 DIAGNOSIS — I110 Hypertensive heart disease with heart failure: Secondary | ICD-10-CM | POA: Diagnosis not present

## 2017-03-13 DIAGNOSIS — J441 Chronic obstructive pulmonary disease with (acute) exacerbation: Secondary | ICD-10-CM | POA: Diagnosis not present

## 2017-03-13 DIAGNOSIS — I481 Persistent atrial fibrillation: Secondary | ICD-10-CM | POA: Diagnosis not present

## 2017-03-13 DIAGNOSIS — D649 Anemia, unspecified: Secondary | ICD-10-CM | POA: Diagnosis not present

## 2017-03-13 DIAGNOSIS — K219 Gastro-esophageal reflux disease without esophagitis: Secondary | ICD-10-CM | POA: Diagnosis not present

## 2017-03-13 DIAGNOSIS — F329 Major depressive disorder, single episode, unspecified: Secondary | ICD-10-CM | POA: Diagnosis not present

## 2017-03-13 DIAGNOSIS — I5030 Unspecified diastolic (congestive) heart failure: Secondary | ICD-10-CM | POA: Diagnosis not present

## 2017-03-13 NOTE — Telephone Encounter (Signed)
I would suggest she stop her anti-coagulation. GT

## 2017-03-14 ENCOUNTER — Telehealth: Payer: Self-pay

## 2017-03-14 DIAGNOSIS — J441 Chronic obstructive pulmonary disease with (acute) exacerbation: Secondary | ICD-10-CM | POA: Diagnosis not present

## 2017-03-14 DIAGNOSIS — F329 Major depressive disorder, single episode, unspecified: Secondary | ICD-10-CM | POA: Diagnosis not present

## 2017-03-14 DIAGNOSIS — I481 Persistent atrial fibrillation: Secondary | ICD-10-CM | POA: Diagnosis not present

## 2017-03-14 DIAGNOSIS — I110 Hypertensive heart disease with heart failure: Secondary | ICD-10-CM | POA: Diagnosis not present

## 2017-03-14 DIAGNOSIS — I5030 Unspecified diastolic (congestive) heart failure: Secondary | ICD-10-CM | POA: Diagnosis not present

## 2017-03-14 DIAGNOSIS — K219 Gastro-esophageal reflux disease without esophagitis: Secondary | ICD-10-CM | POA: Diagnosis not present

## 2017-03-14 NOTE — Telephone Encounter (Signed)
Received detailed written order for Brovana 15MCG from [place]. Signed form has been faxed to provided number. Received successful fax confirmation. Nothing further needed.

## 2017-03-17 ENCOUNTER — Other Ambulatory Visit: Payer: Self-pay | Admitting: Nurse Practitioner

## 2017-03-17 ENCOUNTER — Telehealth: Payer: Self-pay | Admitting: Nurse Practitioner

## 2017-03-17 ENCOUNTER — Other Ambulatory Visit: Payer: Self-pay

## 2017-03-17 DIAGNOSIS — K922 Gastrointestinal hemorrhage, unspecified: Secondary | ICD-10-CM

## 2017-03-17 DIAGNOSIS — Z86711 Personal history of pulmonary embolism: Secondary | ICD-10-CM

## 2017-03-17 DIAGNOSIS — I4891 Unspecified atrial fibrillation: Secondary | ICD-10-CM

## 2017-03-17 NOTE — Telephone Encounter (Signed)
We all agree to stop anticoagulation Please let daughter know we stop the Eliquis IR consult for hx PE - evaluate for IVC filter

## 2017-03-17 NOTE — Telephone Encounter (Signed)
Spoke with [name]. Referral was correct. It is being reviewed by IR Dr watts. Spoke with Cammy Copa. Expresses understanding of the plan to stop Eliquis and consult IR. The patient will see Dr Lindi Adie tomorrow morning.

## 2017-03-17 NOTE — Telephone Encounter (Signed)
Pt called requesting refill for: Paroxetine 20 mg, calcium 600+D, Pantoprazole 40 mg and furosemide 20mg  send to [facility] on [address]   We never send in these med. Pt is requesting 90 days supply.

## 2017-03-17 NOTE — Telephone Encounter (Signed)
Left message for Tricia Hernandez to call. Referral entered into EPIC. Message also left for [name] in IR to discuss the referral.

## 2017-03-18 ENCOUNTER — Telehealth: Payer: Self-pay

## 2017-03-18 ENCOUNTER — Encounter: Payer: Self-pay | Admitting: Hematology and Oncology

## 2017-03-18 ENCOUNTER — Ambulatory Visit (HOSPITAL_BASED_OUTPATIENT_CLINIC_OR_DEPARTMENT_OTHER): Payer: Medicare Other | Admitting: Hematology and Oncology

## 2017-03-18 DIAGNOSIS — R911 Solitary pulmonary nodule: Secondary | ICD-10-CM

## 2017-03-18 DIAGNOSIS — C210 Malignant neoplasm of anus, unspecified: Secondary | ICD-10-CM

## 2017-03-18 DIAGNOSIS — R140 Abdominal distension (gaseous): Secondary | ICD-10-CM

## 2017-03-18 DIAGNOSIS — D649 Anemia, unspecified: Secondary | ICD-10-CM | POA: Diagnosis not present

## 2017-03-18 DIAGNOSIS — K620 Anal polyp: Secondary | ICD-10-CM

## 2017-03-18 MED ORDER — PANTOPRAZOLE SODIUM 40 MG PO TBEC: 40.0000 mg | DELAYED_RELEASE_TABLET | Freq: Every day | ORAL | 0 refills | Status: DC

## 2017-03-18 MED ORDER — FUROSEMIDE 20 MG PO TABS: 20.0000 mg | ORAL_TABLET | Freq: Every day | ORAL | 0 refills | Status: DC

## 2017-03-18 MED ORDER — ERGOCALCIFEROL 1.25 MG (50000 UT) PO CAPS: 50000.0000 [IU] | ORAL_CAPSULE | ORAL | 3 refills | Status: DC

## 2017-03-18 MED ORDER — PAROXETINE HCL 20 MG PO TABS: 20.0000 mg | ORAL_TABLET | Freq: Every day | ORAL | 0 refills | Status: DC

## 2017-03-18 NOTE — Progress Notes (Signed)
Patient Care Team: [name], [name], NP as PCP - General (Internal Medicine)  DIAGNOSIS:  Encounter Diagnosis  Name Primary?  Marland Kitchen Anal cancer ([place])     SUMMARY OF ONCOLOGIC HISTORY:   Lung cancer ([place])    Anal cancer ([place])   07/25/2016 Initial Diagnosis    Perianal: Squamous cell cancer atleast 5 cm, Moderately differentiated T2N1 (lung nodules, inguinal LN unclear etiology) stage 3B vs Stage 4      08/13/2016 -  Radiation Therapy    Anal Radiation (diverting colostomy)       CHIEF COMPLIANT: Follow-up of anal cancer, severe anemia requiring blood transfusion  INTERVAL HISTORY: Tricia Hernandez is a 83 year old with above-mentioned history of atrial fibrillation and history of pulmonary embolism 4 years ago who has been on oral anticoagulation and had GI bleeding. She follows with Dr. Carlean Purl. We discontinued anticoagulation after discussing the pros and cons. Patient continues to be short of breath to minimal exertion and is mostly sedentary. She received 2 units of blood transfusion and felt remarkably better. She had been in the hospital with congestive heart failure. Her blood pressure and pulse returned at extremely low.  REVIEW OF SYSTEMS:   Constitutional: Denies fevers, chills or abnormal weight loss Eyes: Denies blurriness of vision Ears, nose, mouth, throat, and face: Denies mucositis or sore throat Respiratory: Shortness of breath to minimal exertion, uses a walker to get around Cardiovascular: Atrial fibrillation Gastrointestinal:  Denies nausea, heartburn or change in bowel habits Skin: Denies abnormal skin rashes Lymphatics: Denies new lymphadenopathy or easy bruising Neurological:Denies numbness, tingling or new weaknesses Behavioral/Psych: Mood is stable, no new changes  Extremities: No lower extremity edema  All other systems were reviewed with the patient and are negative.  I have reviewed the past medical history, past surgical history, social history and  family history with the patient and they are unchanged from previous note.  ALLERGIES:  is allergic to penicillins; aleve [naproxen sodium]; antihistamines, chlorpheniramine-type; aspirin; benadryl [diphenhydramine hcl]; codeine; hydrochlorothiazide; rocephin [ceftriaxone sodium in dextrose]; and sulfa antibiotics.  MEDICATIONS:  Current Outpatient Prescriptions  Medication Sig Dispense Refill  . arformoterol (BROVANA) 15 MCG/2ML NEBU Take 2 mLs (15 mcg total) by nebulization 2 (two) times daily. Dx copd: J44.9 120 mL 5  . atorvastatin (LIPITOR) 20 MG tablet Take 1 tablet (20 mg total) by mouth daily. 90 tablet 1  . budesonide (PULMICORT) 0.5 MG/2ML nebulizer solution Take 2 mLs (0.5 mg total) by nebulization 2 (two) times daily. Dx code: j44.9 120 mL 5  . Calcium 600-200 MG-UNIT tablet Take 1 tablet by mouth daily.    Marland Kitchen dofetilide (TIKOSYN) 500 MCG capsule Take 1 capsule (500 mcg total) by mouth 2 (two) times daily. 180 capsule 3  . ENSURE PLUS (ENSURE PLUS) LIQD Take 237 mLs by mouth daily.     . ergocalciferol (VITAMIN D2) 50000 units capsule Take 50,000 Units by mouth once a week.    . fenofibrate 160 MG tablet Take 1 tablet (160 mg total) by mouth daily. 90 tablet 1  . ferrous sulfate 325 (65 FE) MG tablet Take 1 tablet (325 mg total) by mouth daily with breakfast. (Patient taking differently: Take 650 mg by mouth daily with breakfast. ) 90 tablet 1  . fluticasone (FLONASE) 50 MCG/ACT nasal spray Place 1 spray into both nostrils daily.    . furosemide (LASIX) 40 MG tablet Take 1 tablet (40 mg total) by mouth daily. For 3 days and then back to 20 mg daily 30 tablet  0  . gabapentin (NEURONTIN) 300 MG capsule 300 mg 3 (three) times daily.     Marland Kitchen ipratropium-albuterol (DUONEB) 0.5-2.5 (3) MG/3ML SOLN Take 3 mLs by nebulization 2 (two) times daily. 540 mL 0  . magnesium oxide (MAG-OX) 400 (241.3 Mg) MG tablet Take 1 tablet (400 mg total) by mouth daily. 60 tablet 1  . metoprolol tartrate  (LOPRESSOR) 25 MG tablet Take 0.5 tablets (12.5 mg total) by mouth 2 (two) times daily. 15 tablet 4  . montelukast (SINGULAIR) 10 MG tablet Take 1 tablet (10 mg total) by mouth at bedtime. 90 tablet 1  . Ostomy Supplies KIT 8514 is number on bag one piece unit. Colostomy bag and adhesive and alcohol prep, no sting, cannot use regular prep pad. 30 each 11  . pantoprazole (PROTONIX) 40 MG tablet Take 1 tablet (40 mg total) by mouth daily.    Marland Kitchen PARoxetine (PAXIL) 20 MG tablet Take 20 mg by mouth at bedtime.     . senna-docusate (SENOKOT-S) 8.6-50 MG tablet Take 1 tablet by mouth at bedtime as needed for mild constipation. (Patient not taking: Reported on 02/25/2017)     No current facility-administered medications for this visit.     PHYSICAL EXAMINATION: ECOG PERFORMANCE STATUS: 2 - Symptomatic, <50% confined to bed  Vitals:   03/18/17 0954  BP: (!) 109/29  Pulse: (!) 37  Resp: 17  Temp: 98.4 F (36.9 C)   Filed Weights   03/18/17 0954  Weight: 242 lb 8 oz (110 kg)    GENERAL:alert, no distress and comfortable SKIN: skin color, texture, turgor are normal, no rashes or significant lesions EYES: normal, Conjunctiva are pink and non-injected, sclera clear OROPHARYNX:no exudate, no erythema and lips, buccal mucosa, and tongue normal  NECK: supple, thyroid normal size, non-tender, without nodularity LYMPH:  no palpable lymphadenopathy in the cervical, axillary or inguinal LUNGS: Diminished breath sounds at the bases HEART:Irregular ABDOMEN:abdomen soft, non-tender and normal bowel sounds MUSCULOSKELETAL:no cyanosis of digits and no clubbing  NEURO: alert & oriented x 3 with fluent speech, no focal motor/sensory deficits EXTREMITIES: 2+ lower extremity edema  LABORATORY DATA:  I have reviewed the data as listed   Chemistry      Component Value Date/Time   NA 140 02/28/2017 1600   NA 144 12/30/2016 0850   K 4.3 02/28/2017 1600   K 4.1 12/30/2016 0850   CL 99 (L) 02/28/2017 1600    CO2 33 (H) 02/28/2017 1600   CO2 33 (H) 12/30/2016 0850   BUN 19 02/28/2017 1600   BUN 27.8 (H) 12/30/2016 0850   CREATININE 0.98 02/28/2017 1600   CREATININE 1.0 12/30/2016 0850      Component Value Date/Time   CALCIUM 9.0 02/28/2017 1600   CALCIUM 9.7 12/30/2016 0850   ALKPHOS 43 02/28/2017 1600   ALKPHOS 46 12/30/2016 0850   AST 21 02/28/2017 1600   AST 17 12/30/2016 0850   ALT 10 (L) 02/28/2017 1600   ALT 10 12/30/2016 0850   BILITOT 0.8 02/28/2017 1600   BILITOT 0.32 12/30/2016 0850       Lab Results  Component Value Date   WBC 5.2 02/28/2017   HGB 7.9 (L) 02/28/2017   HCT 26.9 (L) 02/28/2017   MCV 90.9 02/28/2017   PLT 184 02/28/2017   NEUTROABS 4.3 02/21/2017    ASSESSMENT & PLAN:  Anal cancer (HCC) Squamous cell carcinoma of the anus: Patient has bilateral inguinal lymphadenopathy and bilateral lung nodules. It is unclear but the inguinal lymphadenopathy and the  lung nodulescould be metastatic disease. it could be a stage IIIa versus stage IV.  Treatment summary 08/13/2016-09/25/2016 : XRT to anus S/P diverting colostomy  CT CAP:12/30/2016: Nonspecific wall thickening posteriorly lower left rectum inguinal adenopathy decreased stable bilateral lung nodules favor benign  Severe anemia from GI bleeding: Due to anticoagulation. We discontinued anticoagulation. She probably would benefit from IVC filter given her extremely high risk for development of blood clots.  Plan: Annual CT scans and follow-ups in May 2019  I spent 25 minutes talking to the patient of which more than half was spent in counseling and coordination of care.  No orders of the defined types were placed in this encounter.  The patient has a good understanding of the overall plan. she agrees with it. she will call with any problems that may develop before the next visit here.   Rulon Eisenmenger, MD 03/18/17

## 2017-03-18 NOTE — Telephone Encounter (Signed)
Calcium+D can be bought OTC. Send other prescription for 90days, no refill. Need OV for further refills

## 2017-03-18 NOTE — Assessment & Plan Note (Signed)
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 It is unclear but the inguinal lymphadenopathy and the lung nodulescould be metastatic disease. it could be a stage IIIa versus stage IV.  Treatment summary 08/13/2016-09/25/2016 : XRT to anus S/P diverting colostomy  CT CAP:12/30/2016: Nonspecific wall thickening posteriorly lower left rectum inguinal adenopathy decreased stable bilateral lung nodules favor benign  Severe anemia from GI bleeding  Plan: Annual CT scans and follow-ups

## 2017-03-18 NOTE — Telephone Encounter (Signed)
Spoke with patient's daughter and she states she was calling back to let Beth know the labs were faxed to Dr. Carlean Purl last week.

## 2017-03-18 NOTE — Telephone Encounter (Signed)
Pt is aware.  

## 2017-03-19 ENCOUNTER — Telehealth: Payer: Self-pay | Admitting: Internal Medicine

## 2017-03-19 DIAGNOSIS — I110 Hypertensive heart disease with heart failure: Secondary | ICD-10-CM | POA: Diagnosis not present

## 2017-03-19 DIAGNOSIS — I5030 Unspecified diastolic (congestive) heart failure: Secondary | ICD-10-CM | POA: Diagnosis not present

## 2017-03-19 DIAGNOSIS — K219 Gastro-esophageal reflux disease without esophagitis: Secondary | ICD-10-CM | POA: Diagnosis not present

## 2017-03-19 DIAGNOSIS — J441 Chronic obstructive pulmonary disease with (acute) exacerbation: Secondary | ICD-10-CM | POA: Diagnosis not present

## 2017-03-19 DIAGNOSIS — F329 Major depressive disorder, single episode, unspecified: Secondary | ICD-10-CM | POA: Diagnosis not present

## 2017-03-19 DIAGNOSIS — I481 Persistent atrial fibrillation: Secondary | ICD-10-CM | POA: Diagnosis not present

## 2017-03-19 NOTE — Telephone Encounter (Signed)
Daughter states pt seen Dr. Lindi Adie yesterday and her HR was 37.  Pt was at home yesterday and was just sitting in chair and told her daughter that she felt like she was going to pass out.  Pt did for a few seconds and came right back to and didn't even realize she had went out.  Daughter states HR today is 38 or 44.  They held Lopressor last night and this morning.  Denies lightheadedness or dizziness prior to passing out.  Spoke with Dr. Burt Knack and he said pt needs to continue holding BB and come in to be seen in EP to discuss possible pacemaker.  Spoke with EP scheduler and she is aware pt needs to come in to be seen.  Spoke with daughter and went over recommendations and she is in agreement.  Daughter asked that we contact her in regards to appt as pt is hard of hearing. Will route to Dr. Lovena Le to make him aware.

## 2017-03-19 NOTE — Telephone Encounter (Signed)
New message   Pt daughter is calling. Yesterday her pulse rate was 37. When they got home from doctor office, pt passed out. Pt daughter states that she came too right after.   Pt c/o Syncope: STAT if syncope occurred within 30 minutes and pt complains of lightheadedness High Priority if episode of passing out, completely, today or in last 24 hours   1. Did you pass out today? No   2. When is the last time you passed out? Yesterday, around 1pm.  3. Has this occurred multiple times? No. Yesterday was the first time that daughter is aware of.  4. Did you have any symptoms prior to passing out? Pt daughter said at doctor office pulse was 37. Pt stated she was going to pass out right before. Pt daughter said she did not give lopressor.

## 2017-03-20 ENCOUNTER — Telehealth: Payer: Self-pay | Admitting: Pulmonary Disease

## 2017-03-20 ENCOUNTER — Encounter: Payer: Self-pay | Admitting: Cardiology

## 2017-03-20 ENCOUNTER — Ambulatory Visit (INDEPENDENT_AMBULATORY_CARE_PROVIDER_SITE_OTHER): Payer: Medicare Other | Admitting: Cardiology

## 2017-03-20 VITALS — BP 102/50 | HR 52 | Ht 64.0 in | Wt 241.4 lb

## 2017-03-20 DIAGNOSIS — I10 Essential (primary) hypertension: Secondary | ICD-10-CM

## 2017-03-20 DIAGNOSIS — R55 Syncope and collapse: Secondary | ICD-10-CM | POA: Diagnosis not present

## 2017-03-20 DIAGNOSIS — I480 Paroxysmal atrial fibrillation: Secondary | ICD-10-CM

## 2017-03-20 DIAGNOSIS — I5032 Chronic diastolic (congestive) heart failure: Secondary | ICD-10-CM | POA: Diagnosis not present

## 2017-03-20 LAB — CBC
Hematocrit: 31.8 % — ABNORMAL LOW (ref 34.0–46.6)
Hemoglobin: 9.5 g/dL — ABNORMAL LOW (ref 11.1–15.9)
MCH: 25.8 pg — ABNORMAL LOW (ref 26.6–33.0)
MCHC: 29.9 g/dL — ABNORMAL LOW (ref 31.5–35.7)
MCV: 86 fL (ref 79–97)
PLATELETS: 158 10*3/uL (ref 150–379)
RBC: 3.68 x10E6/uL — AB (ref 3.77–5.28)
RDW: 16.5 % — ABNORMAL HIGH (ref 12.3–15.4)
WBC: 5.3 10*3/uL (ref 3.4–10.8)

## 2017-03-20 NOTE — Addendum Note (Signed)
Addended by: Eulis Foster on: 03/20/2017 09:11 AM   Modules accepted: Orders

## 2017-03-20 NOTE — Patient Instructions (Addendum)
Medication Instructions:   STAY OFF THE METOPROLOL  Lab work,  Your physician recommends that you have lab work today  Follow-Up:  Your physician recommends that you schedule a follow-up appointment in: [place]

## 2017-03-20 NOTE — Progress Notes (Signed)
Electrophysiology Office Note   Date:  03/20/2017   ID:  Tricia Hernandez, DOB 1931/05/28, MRN [id number]  PCP:  Flossie Buffy, NP  Cardiologist:  Lovena Le Primary Electrophysiologist: Lovena Le    Chief Complaint  Patient presents with  . Bradycardia     History of Present Illness: Tricia Hernandez is a 83 y.o. female who is being seen today for the evaluation of bradycardia at the request of [name], [name], NP. Presenting today for electrophysiology evaluation. She has a history of atrial fibrillation with rapid rates, CHF, and obesity. She has very rapid rates apparently when she is in atrial fibrillation from prior notes. Her symptoms from her atrial fibrillation are fatigue and shortness of breath.   She presents today after an episode of syncope which occurred earlier this week. According to her daughter, she was sitting at the table, and said that she was going to pass out. When she awoke she did not have any major issues. She did not have bowel or bladder incontinence. Her mental status had not changed. She had previously been to her primary physician's office and was noted to be bradycardic, and her blood pressure was low. Her daughter stopped her metoprolol.  Today, she denies symptoms of palpitations, chest pain, shortness of breath, orthopnea, PND, lower extremity edema, claudication, dizziness, presyncope, bleeding, or neurologic sequela. The patient is tolerating medications without difficulties.    Past Medical History:  Diagnosis Date  . 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 Anemia   . Anxiety   . Atrial fibrillation ([place])   . Basal cell carcinoma of skin of nose   . CHF (congestive heart failure) ([place]) 11/2015  . Chronic bronchitis ([place])   . Chronic depression   . Colostomy in place [facility]) 07/2016  . Diverticular disease   . Fibrocystic disease of breast   . Fracture of shoulder   . GERD (gastroesophageal reflux  disease)   . GI bleed 12/10/2016   in setting of no PPI. 5/2 EGD: grade A reflux esophagitis.  Mild, non-hemorrhagic gastritis.  Ablation of 3 Duodenal AVMs  . Herpes zoster   . History of blood transfusion    for vaginal, uterine bleeding leading to hysterectomy.  in 12/2016 transfused x 1 for GI bleed.   Marland Kitchen History of hiatal hernia   . Hyperlipidemia   . Hypertension   . Labyrinthitis   . Lung cancer ([place])    RML  . Obesity   . Paroxysmal supraventricular tachycardia ([place])   . Peptic ulcer of stomach    hx  . Pneumonia 1980s X 1; 08/2016   "walking; double"  . Pulmonary embolism ([place]) 05/2014   "left lung"  . Vitamin D deficiency    Past Surgical History:  Procedure Laterality Date  . APPENDECTOMY  1959  . BASAL CELL CARCINOMA EXCISION     nose  . BREAST CYST ASPIRATION Right 2016?  Marland Kitchen CATARACT EXTRACTION W/ INTRAOCULAR LENS  IMPLANT, BILATERAL Bilateral 1990s  . CHOLECYSTECTOMY OPEN  1980s  . COLOSTOMY N/A 07/29/2016   Procedure: COLOSTOMY;  Surgeon: Clovis Riley, MD;  Location: [place];  Service: General;  Laterality: N/A;  . DILATION AND CURETTAGE OF UTERUS    . ESOPHAGOGASTRODUODENOSCOPY N/A 12/11/2016   Procedure: ESOPHAGOGASTRODUODENOSCOPY (EGD);  Surgeon: Jerene Bears, MD;  [facility] ENDOSCOPY;  Service: Endoscopy;  Laterality: N/A;  . EVALUATION UNDER ANESTHESIA WITH HEMORRHOIDECTOMY AND PROCTOSCOPY N/A 07/25/2016   Procedure: Jasmine December  UNDER ANESTHESIA, EXCISION PERIANAL MASS.;  Surgeon: Judeth Horn, MD;  Location: [place];  Service: General;  Laterality: N/A;  Prone position  . LAPAROSCOPIC DIVERTED COLOSTOMY N/A 07/29/2016   Procedure: ATTEMPTED LAPAROSCOPIC ASSISTED COLOSTOMY;  Surgeon: Clovis Riley, MD;  Location: [place];  Service: General;  Laterality: N/A;  . LUNG REMOVAL, PARTIAL Right 2013   RML mass/carcinoid, Dr Lurena Nida  . TONSILLECTOMY AND ADENOIDECTOMY  1960s  . VAGINAL HYSTERECTOMY  1959   "partial"     Current Outpatient Prescriptions  Medication  Sig Dispense Refill  . arformoterol (BROVANA) 15 MCG/2ML NEBU Take 2 mLs (15 mcg total) by nebulization 2 (two) times daily. Dx copd: J44.9 120 mL 5  . atorvastatin (LIPITOR) 20 MG tablet Take 1 tablet (20 mg total) by mouth daily. 90 tablet 1  . budesonide (PULMICORT) 0.5 MG/2ML nebulizer solution Take 2 mLs (0.5 mg total) by nebulization 2 (two) times daily. Dx code: j44.9 120 mL 5  . Calcium 600-200 MG-UNIT tablet Take 1 tablet by mouth daily.    Marland Kitchen dofetilide (TIKOSYN) 500 MCG capsule Take 1 capsule (500 mcg total) by mouth 2 (two) times daily. 180 capsule 3  . ENSURE PLUS (ENSURE PLUS) LIQD Take 237 mLs by mouth daily.     . ergocalciferol (VITAMIN D2) 50000 units capsule Take 1 capsule (50,000 Units total) by mouth once a week. 12 capsule 3  . fenofibrate 160 MG tablet Take 1 tablet (160 mg total) by mouth daily. 90 tablet 1  . ferrous sulfate 325 (65 FE) MG tablet Take 1 tablet (325 mg total) by mouth daily with breakfast. (Patient taking differently: Take 650 mg by mouth daily with breakfast. ) 90 tablet 1  . fluticasone (FLONASE) 50 MCG/ACT nasal spray Place 1 spray into both nostrils daily.    . furosemide (LASIX) 20 MG tablet Take 1 tablet (20 mg total) by mouth daily. 90 tablet 0  . ipratropium-albuterol (DUONEB) 0.5-2.5 (3) MG/3ML SOLN Take 3 mLs by nebulization 2 (two) times daily. 540 mL 0  . magnesium oxide (MAG-OX) 400 (241.3 Mg) MG tablet Take 1 tablet (400 mg total) by mouth daily. 60 tablet 1  . montelukast (SINGULAIR) 10 MG tablet Take 1 tablet (10 mg total) by mouth at bedtime. 90 tablet 1  . Ostomy Supplies KIT 8514 is number on bag one piece unit. Colostomy bag and adhesive and alcohol prep, no sting, cannot use regular prep pad. 30 each 11  . pantoprazole (PROTONIX) 40 MG tablet Take 1 tablet (40 mg total) by mouth daily. 90 tablet 0  . PARoxetine (PAXIL) 20 MG tablet Take 1 tablet (20 mg total) by mouth at bedtime. 90 tablet 0  . senna-docusate (SENOKOT-S) 8.6-50 MG tablet  Take 1 tablet by mouth at bedtime as needed for mild constipation.     No current facility-administered medications for this visit.     Allergies:   Penicillins; Aleve [naproxen sodium]; Antihistamines, chlorpheniramine-type; Aspirin; Benadryl [diphenhydramine hcl]; Codeine; Hydrochlorothiazide; Rocephin [ceftriaxone sodium in dextrose]; and Sulfa antibiotics   Social History:  The patient  reports that she quit smoking about 42 years ago. Her smoking use included Cigarettes. She has a 7.50 pack-year smoking history. She has never used smokeless tobacco. She reports that she does not drink alcohol or use drugs.   Family History:  The patient's family history includes Breast cancer in her sister; CVA in her mother; Cancer in her brother; Cervical cancer in her daughter; Heart Problems in her brother; Heart attack in her  mother; Hypertension in her mother; Leukemia in her daughter and son; Multiple myeloma in her mother; Prostate cancer in her brother; Skin cancer in her sister; Throat cancer in her brother and brother.    ROS:  Please see the history of present illness.   Otherwise, review of systems is positive for fatigue, hearing loss, depression, anxiety, balance problems, easy bruising.   All other systems are reviewed and negative.    PHYSICAL EXAM: VS:  BP (!) 102/50   Pulse (!) 52   Ht 5' 4" (1.626 m)   Wt 241 lb 6.4 oz (109.5 kg)   BMI 41.44 kg/m  , BMI Body mass index is 41.44 kg/m. GEN: Well nourished, well developed, in no acute distress  HEENT: normal  Neck: no JVD, carotid bruits, or masses Cardiac: RRR; no murmurs, rubs, or gallops,no edema  Respiratory:  clear to auscultation bilaterally, normal work of breathing GI: soft, nontender, nondistended, + BS MS: no deformity or atrophy  Skin: warm and dry Neuro:  Strength and sensation are intact Psych: euthymic mood, full affect  EKG:  EKG is ordered today. Personal review of the ekg ordered shows ectopic atrial rhythm,  rate 52, prolonged QTc 485 ms  Recent Labs: 12/10/2016: Magnesium 2.1 12/11/2016: TSH 1.558 02/21/2017: Pro B Natriuretic peptide (BNP) 354.0 02/28/2017: ALT 10; B Natriuretic Peptide 333.8; BUN 19; Creatinine, Ser 0.98; Hemoglobin 7.9; Platelets 184; Potassium 4.3; Sodium 140    Lipid Panel     Component Value Date/Time   CHOL 144 11/04/2016 1517   TRIG 145.0 11/04/2016 1517   HDL 46.20 11/04/2016 1517   CHOLHDL 3 11/04/2016 1517   VLDL 29.0 11/04/2016 1517   LDLCALC 69 11/04/2016 1517     Wt Readings from Last 3 Encounters:  03/20/17 241 lb 6.4 oz (109.5 kg)  03/18/17 242 lb 8 oz (110 kg)  02/28/17 250 lb (113.4 kg)      Other studies Reviewed: Additional studies/ records that were reviewed today include: TTE 12/13/16  Review of the above records today demonstrates:  - Left ventricle: The cavity size was normal. Wall thickness was   increased in a pattern of mild LVH. Systolic function was normal.   The estimated ejection fraction was in the range of 55% to 60%.   Wall motion was normal; there were no regional wall motion   abnormalities. The study is not technically sufficient to allow   evaluation of LV diastolic function. - Aortic valve: Trileaflet. Sclerosis without stenosis. There was   no regurgitation. - Right ventricle: The cavity size was normal. Systolic function is   mildly reduced. - Tricuspid valve: There was mild regurgitation. - Pulmonary arteries: PA peak pressure: 39 mm Hg (S). - Inferior vena cava: The vessel was normal in size. The   respirophasic diameter changes were in the normal range (>= 50%),   consistent with normal central venous pressure.   ASSESSMENT AND PLAN:  1.  Atrial fibrillation: Currently on dofetilide maintaining sinus rhythm. Eliquis for anticoagulation.  This patients CHA2DS2-VASc Score and unadjusted Ischemic Stroke Rate (% per year) is equal to 4.8 % stroke rate/year from a score of 4  Above score calculated as 1 point each if  present [CHF, HTN, DM, Vascular=MI/PAD/Aortic Plaque, Age if 65-74, or Female] Above score calculated as 2 points each if present [Age > 75, or Stroke/TIA/TE]  2. Obesity: encouraged weight loss  3. Hypertension:well controlled  4. Chronic diastolic heart failure: Class II symptoms. No signs of volume overload.  5.  Syncope: Possibly due to bradycardia. EKG today shows a low atrial rhythm. She has stopped her metoprolol and has felt much better. Her heart rate is in the 50s today. I do not see an indication for pacemaker placement. I have told her that if she continues to have episodes of syncope, pacemaker implantation may be indicated. We  have her follow-up with one of the nurse practitioners in 2 weeks for further evaluation.   Current medicines are reviewed at length with the patient today.   The patient does not have concerns regarding her medicines.  The following changes were made today:  none  Labs/ tests ordered today include:  Orders Placed This Encounter  Procedures  . EKG 12-Lead     Disposition:   FU with EP NP 1 week  Signed,  Meredith Leeds, MD  03/20/2017 8:46 AM     [facility] [address] [place] [address] [phone] (office) [phone] (fax)

## 2017-03-20 NOTE — Telephone Encounter (Signed)
Received a coverage determination form for patient's budesonide 0.5mg /68mL. Forms have been filled out but need PM's signature. I will stamp the forms when I return to [facility] tomorrow.

## 2017-03-20 NOTE — Addendum Note (Signed)
Addended by: Cristopher Estimable on: 03/20/2017 08:52 AM   Modules accepted: Orders

## 2017-03-21 DIAGNOSIS — I5030 Unspecified diastolic (congestive) heart failure: Secondary | ICD-10-CM | POA: Diagnosis not present

## 2017-03-21 DIAGNOSIS — K219 Gastro-esophageal reflux disease without esophagitis: Secondary | ICD-10-CM | POA: Diagnosis not present

## 2017-03-21 DIAGNOSIS — J441 Chronic obstructive pulmonary disease with (acute) exacerbation: Secondary | ICD-10-CM | POA: Diagnosis not present

## 2017-03-21 DIAGNOSIS — F329 Major depressive disorder, single episode, unspecified: Secondary | ICD-10-CM | POA: Diagnosis not present

## 2017-03-21 DIAGNOSIS — I481 Persistent atrial fibrillation: Secondary | ICD-10-CM | POA: Diagnosis not present

## 2017-03-21 DIAGNOSIS — I110 Hypertensive heart disease with heart failure: Secondary | ICD-10-CM | POA: Diagnosis not present

## 2017-03-21 NOTE — Progress Notes (Signed)
Please call daughter - Hgb is stable at 9.5 (outside labs not showing here were similar)

## 2017-03-25 ENCOUNTER — Other Ambulatory Visit (HOSPITAL_COMMUNITY): Payer: Self-pay | Admitting: Interventional Radiology

## 2017-03-25 ENCOUNTER — Other Ambulatory Visit: Payer: Self-pay | Admitting: *Deleted

## 2017-03-25 ENCOUNTER — Ambulatory Visit
Admission: RE | Admit: 2017-03-25 | Discharge: 2017-03-25 | Disposition: A | Discharge status: Home / Self Care | Payer: Medicare Other | Source: Ambulatory Visit | Attending: Internal Medicine | Admitting: Internal Medicine

## 2017-03-25 ENCOUNTER — Encounter: Payer: Self-pay | Admitting: Internal Medicine

## 2017-03-25 ENCOUNTER — Encounter: Payer: Self-pay | Admitting: Nurse Practitioner

## 2017-03-25 ENCOUNTER — Other Ambulatory Visit: Payer: Self-pay | Admitting: Radiology

## 2017-03-25 ENCOUNTER — Ambulatory Visit (INDEPENDENT_AMBULATORY_CARE_PROVIDER_SITE_OTHER): Payer: Medicare Other | Admitting: Nurse Practitioner

## 2017-03-25 VITALS — BP 120/56 | HR 59 | Ht 64.0 in | Wt 237.6 lb

## 2017-03-25 DIAGNOSIS — I4891 Unspecified atrial fibrillation: Secondary | ICD-10-CM

## 2017-03-25 DIAGNOSIS — I495 Sick sinus syndrome: Secondary | ICD-10-CM | POA: Diagnosis not present

## 2017-03-25 DIAGNOSIS — Z86711 Personal history of pulmonary embolism: Secondary | ICD-10-CM

## 2017-03-25 DIAGNOSIS — K922 Gastrointestinal hemorrhage, unspecified: Secondary | ICD-10-CM

## 2017-03-25 DIAGNOSIS — I110 Hypertensive heart disease with heart failure: Secondary | ICD-10-CM | POA: Diagnosis not present

## 2017-03-25 DIAGNOSIS — Z7901 Long term (current) use of anticoagulants: Secondary | ICD-10-CM | POA: Diagnosis not present

## 2017-03-25 DIAGNOSIS — I480 Paroxysmal atrial fibrillation: Secondary | ICD-10-CM

## 2017-03-25 DIAGNOSIS — J441 Chronic obstructive pulmonary disease with (acute) exacerbation: Secondary | ICD-10-CM | POA: Diagnosis not present

## 2017-03-25 DIAGNOSIS — Z79899 Other long term (current) drug therapy: Secondary | ICD-10-CM

## 2017-03-25 DIAGNOSIS — I481 Persistent atrial fibrillation: Secondary | ICD-10-CM | POA: Diagnosis not present

## 2017-03-25 DIAGNOSIS — D649 Anemia, unspecified: Secondary | ICD-10-CM | POA: Diagnosis not present

## 2017-03-25 DIAGNOSIS — K219 Gastro-esophageal reflux disease without esophagitis: Secondary | ICD-10-CM | POA: Diagnosis not present

## 2017-03-25 DIAGNOSIS — I5030 Unspecified diastolic (congestive) heart failure: Secondary | ICD-10-CM | POA: Diagnosis not present

## 2017-03-25 DIAGNOSIS — F329 Major depressive disorder, single episode, unspecified: Secondary | ICD-10-CM | POA: Diagnosis not present

## 2017-03-25 HISTORY — PX: IR RADIOLOGIST EVAL & MGMT: IMG5224

## 2017-03-25 NOTE — Patient Instructions (Addendum)
We will be checking the following labs today - NONE   Medication Instructions:    Continue with your current medicines.     Testing/Procedures To Be Arranged:  N/A  Follow-Up:  See Dr. Lovena Le in 4 to 6 weeks with EKG   Other Special Instructions:   N/A    If you need a refill on your cardiac medications before your next appointment, please call your pharmacy.   Call the [place] office at [phone] if you have any questions, problems or concerns.

## 2017-03-25 NOTE — Consult Note (Signed)
Chief Complaint: Remote history of pulmonary embolus, recent GI bleed, patient was taken off her anticoagulation. Assess for filter.  Referring Physician(s): Gessner,Carl E  History of Present Illness: Tricia Hernandez is a 83 y.o. female with a remote history of pulmonary embolus from 2014. Prior to the pulmonary emboli, she had a right middle lobectomy for carcinoid tumor. At the time of the pulmonary embolus in 2014, the patient and her daughter describe that she had 2small pulmonary emboli in the left lung by imaging. Outside imaging and reports are not available. She has been on chronic eliquis the last 4 years. 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 Because of the GI bleed,  pulmonology, cardiology, and gastroenterology recommended stopping the anticoagulation. She has been off eliquis for 2 weeks. In addition, she does not remember having a lower extremity ultrasound to diagnose any DVT. She has no history of a hypercoagulable state. She currently is asymptomatic and remains fairly functional. She does walk with a walker. She does not currently drive. No recent prolonged travel or symptoms. No current chest pains or shortness of breath.  Past Medical History:  Diagnosis Date  . 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 Anemia   . Anxiety   . Atrial fibrillation ([place])   . Basal cell carcinoma of skin of nose   . CHF (congestive heart failure) ([place]) 11/2015  . Chronic bronchitis ([place])   . Chronic depression   . Colostomy in place [facility]) 07/2016  . Diverticular disease   . Fibrocystic disease of breast   . Fracture of shoulder   . GERD (gastroesophageal reflux disease)   . GI bleed 12/10/2016   in setting of no PPI. 5/2 EGD: grade A reflux esophagitis.  Mild, non-hemorrhagic gastritis.  Ablation of 3 Duodenal AVMs  . Herpes zoster   . History of blood transfusion    for vaginal, uterine  bleeding leading to hysterectomy.  in 12/2016 transfused x 1 for GI bleed.   Marland Kitchen History of hiatal hernia   . Hyperlipidemia   . Hypertension   . Labyrinthitis   . Lung cancer ([place])    RML  . Obesity   . Paroxysmal supraventricular tachycardia ([place])   . Peptic ulcer of stomach    hx  . Pneumonia 1980s X 1; 08/2016   "walking; double"  . Pulmonary embolism ([place]) 05/2014   "left lung"  . Vitamin D deficiency     Past Surgical History:  Procedure Laterality Date  . APPENDECTOMY  1959  . BASAL CELL CARCINOMA EXCISION     nose  . BREAST CYST ASPIRATION Right 2016?  Marland Kitchen CATARACT EXTRACTION W/ INTRAOCULAR LENS  IMPLANT, BILATERAL Bilateral 1990s  . CHOLECYSTECTOMY OPEN  1980s  . COLOSTOMY N/A 07/29/2016   Procedure: COLOSTOMY;  Surgeon: Clovis Riley, MD;  Location: [place];  Service: General;  Laterality: N/A;  . DILATION AND CURETTAGE OF UTERUS    . ESOPHAGOGASTRODUODENOSCOPY N/A 12/11/2016   Procedure: ESOPHAGOGASTRODUODENOSCOPY (EGD);  Surgeon: Jerene Bears, MD;  Location: [facility] ENDOSCOPY;  Service: Endoscopy;  Laterality: N/A;  . EVALUATION UNDER ANESTHESIA WITH HEMORRHOIDECTOMY AND PROCTOSCOPY N/A 07/25/2016   Procedure: EXAM UNDER ANESTHESIA, EXCISION PERIANAL MASS.;  Surgeon: Judeth Horn, MD;  Location: [place];  Service: General;  Laterality: N/A;  Prone position  . LAPAROSCOPIC DIVERTED COLOSTOMY N/A 07/29/2016   Procedure: ATTEMPTED LAPAROSCOPIC  ASSISTED COLOSTOMY;  Surgeon: Clovis Riley, MD;  Location: [place];  Service: General;  Laterality: N/A;  . LUNG REMOVAL, PARTIAL Right 2013   RML mass/carcinoid, Dr Lurena Nida  . TONSILLECTOMY AND ADENOIDECTOMY  1960s  . VAGINAL HYSTERECTOMY  1959   "partial"    Allergies: Penicillins; Aleve [naproxen sodium]; Antihistamines, chlorpheniramine-type; Aspirin; Benadryl [diphenhydramine hcl]; Codeine; Hydrochlorothiazide; Rocephin [ceftriaxone sodium in dextrose]; and Sulfa antibiotics  Medications: Prior to Admission medications     Medication Sig Start Date End Date Taking? Authorizing Provider  arformoterol (BROVANA) 15 MCG/2ML NEBU Take 2 mLs (15 mcg total) by nebulization 2 (two) times daily. Dx copd: J44.9 02/25/17   Mannam, Hart Robinsons, MD  atorvastatin (LIPITOR) 20 MG tablet Take 1 tablet (20 mg total) by mouth daily. 11/04/16   [name], [name], NP  budesonide (PULMICORT) 0.5 MG/2ML nebulizer solution Take 2 mLs (0.5 mg total) by nebulization 2 (two) times daily. Dx code: j44.9 02/25/17   Marshell Garfinkel, MD  Calcium 600-200 MG-UNIT tablet Take 1 tablet by mouth daily.    [provider]  dofetilide (TIKOSYN) 500 MCG capsule Take 1 capsule (500 mcg total) by mouth 2 (two) times daily. 10/10/16   Seiler, Amber K, NP  ENSURE PLUS (ENSURE PLUS) LIQD Take 237 mLs by mouth daily.     [provider]  ergocalciferol (VITAMIN D2) 50000 units capsule Take 1 capsule (50,000 Units total) by mouth once a week. 03/18/17   Nicholas Lose, MD  fenofibrate 160 MG tablet Take 1 tablet (160 mg total) by mouth daily. 11/04/16   [name], [name], NP  ferrous sulfate 325 (65 FE) MG EC tablet Take 325 mg by mouth 2 (two) times daily.    [provider]  fluticasone (FLONASE) 50 MCG/ACT nasal spray Place 1 spray into both nostrils daily.    [provider]  furosemide (LASIX) 20 MG tablet Take 1 tablet (20 mg total) by mouth daily. 03/18/17   [name], [name], NP  gabapentin (NEURONTIN) 300 MG capsule TK 1 C PO TID 02/20/17   [provider]  ipratropium-albuterol (DUONEB) 0.5-2.5 (3) MG/3ML SOLN Take 3 mLs by nebulization 2 (two) times daily. 11/14/16   Mannam, Hart Robinsons, MD  magnesium oxide (MAG-OX) 400 (241.3 Mg) MG tablet Take 1 tablet (400 mg total) by mouth daily. 11/12/16   Evans Lance, MD  montelukast (SINGULAIR) 10 MG tablet Take 1 tablet (10 mg total) by mouth at bedtime. 11/04/16   NcheCharlene Brooke, NP  O[employer] Supp[employer] KIT [phone] is number on bag one piece unit. Colostomy bag and adhesive and  alcohol prep, no sting, cannot use regular prep pad. 12/20/16   [name], [name], NP  pantoprazole (PROTONIX) 40 MG tablet Take 1 tablet (40 mg total) by mouth daily. 03/18/17   [name], [name], NP  PARoxetine (PAXIL) 20 MG tablet Take 1 tablet (20 mg total) by mouth at bedtime. 03/18/17   [name], [name], NP  senna-docusate (SENOKOT-S) 8.6-50 MG tablet Take 1 tablet by mouth at bedtime as needed for mild constipation. 12/15/16   Domenic Polite, MD     Family History  Problem Relation Age of Onset  . Heart attack Mother        Dec 1987  . CVA Mother   . Hypertension Mother   . Multiple myeloma Mother   . Breast cancer Sister   . Skin cancer Sister   . Prostate cancer Brother   . Throat cancer Brother   . Cervical cancer Daughter   .  Leukemia Daughter   . Leukemia Son   . Throat cancer Brother   . Cancer Brother        soft palette  . Heart Problems Brother        Pacemaker  . Colon cancer Neg Hx   . Pancreatic cancer Neg Hx     Social History   Social History  . Marital status: Widowed    Spouse name: N/A  . Number of children: 5  . Years of education: N/A   Occupational History  . retired    Social History Main Topics  . Smoking status: Former Smoker    Packs/day: 0.50    Years: 15.00    Types: Cigarettes    Quit date: 1976  . Smokeless tobacco: Never Used  . Alcohol use No  . Drug use: No  . Sexual activity: No   Other Topics Concern  . Not on file   Social History Narrative  . No narrative on file      Review of Systems: A 12 point ROS discussed and pertinent positives are indicated in the HPI above.  All other systems are negative.  Review of Systems  Vital Signs: BP (!) 112/51   Pulse (!) 59   Temp 98.3 F (36.8 C) (Oral)   Resp 16   Ht 5' 4"  (1.626 m)   Wt 237 lb (107.5 kg)   SpO2 93%   BMI 40.68 kg/m   Physical Exam  Constitutional:  Elderly mildly obese female in no distress.  Eyes: Conjunctivae are normal. No scleral icterus.   Cardiovascular:  Irregular with a mild systolic murmur.  Pulmonary/Chest: Effort normal and breath sounds normal. No respiratory distress.  Abdominal: Soft. Bowel sounds are normal. She exhibits no distension.  Musculoskeletal: Normal range of motion. She exhibits no edema or tenderness.  Symmetric lower extremities. No significant peripheral edema or leg discomfort. No current signs of DVT.  Skin: Skin is warm and dry. No erythema.  Psychiatric: She has a normal mood and affect. Her behavior is normal.      Imaging: Dg Chest Port 1 View  Result Date: 02/28/2017 CLINICAL DATA:  Anemia. History of gastrointestinal bleeding. Weakness and fatigue. EXAM: PORTABLE CHEST 1 VIEW COMPARISON:  02/23/2017.  Multiple previous chest exams. FINDINGS: Chronic cardiomegaly. Chronic pleural and parenchymal pulmonary scarring. Possible small effusions. No sign of active infiltrate, consolidation or lobar collapse. No acute bone finding. IMPRESSION: Cardiomegaly. Chronic lung disease. Possible small effusions. No other active disease identified. Electronically Signed   By: Nelson Chimes M.D.   On: 02/28/2017 19:46    Labs:  CBC:  Recent Labs  02/21/17 1203 02/22/17 0454 02/28/17 1600 03/20/17 0911  WBC 5.6 6.2 5.2 5.3  HGB 8.2 Repeated and verified X2.* 8.2* 7.9* 9.5*  HCT 25.1* 28.4* 26.9* 31.8*  PLT 179.0 198 184 158    COAGS:  Recent Labs  07/22/16 2028  07/24/16 1635 07/25/16 0153 07/26/16 0235 12/11/16 0011  INR 1.30  --   --   --   --  1.34  APTT 29  < > 98* 102* 28 29  < > = values in this interval not displayed.  BMP:  Recent Labs  12/14/16 1109  02/21/17 1203 02/22/17 0454 02/23/17 0354 02/28/17 1600  NA 136  < > 143 141 141 140  K 4.1  < > 4.1 4.2 3.6 4.3  CL 95*  --  98 98* 95* 99*  CO2 33*  < > 38* 37* 40* 33*  GLUCOSE 141*  < > 97 140* 108* 95  BUN 17  < > 22 18 19 19   CALCIUM 8.5*  < > 9.2 9.3 8.7* 9.0  CREATININE 1.05*  < > 1.00 0.94 1.11* 0.98  GFRNONAA  48*  --   --  55* 45* 52*  GFRAA 55*  --   --  >60 52* >60  < > = values in this interval not displayed.  LIVER FUNCTION TESTS:  Recent Labs  09/02/16 0522 12/11/16 0011 12/30/16 0850 02/28/17 1600  BILITOT 0.4 0.3 0.32 0.8  AST 19 20 17 21   ALT 17 12* 10 10*  ALKPHOS 58 40 46 43  PROT 6.0* 4.7* 6.2* 6.0*  ALBUMIN 2.7* 2.2* 3.2* 3.4*      Assessment and Plan:  Elderly 84 year old female with a remote history of pulmonary emboli from 2014. This has been treated with chronic anticoagulation for 4 years. She recently had a GI bleed related to small bowel AVMs. Anticoagulation has been stopped.  Plan: We had a lengthy discussion about IVC filter placement, indications, contraindications, complications, risks, benefits. I think she is low risk for recurrent VTE / pulmonary emboli.  Recommendations: Obtain hypercoagulable panel and follow up with hematology.   Ultrasound venous Doppler bilateral lower extremities to exclude DVT.   Consider holding on IVC filter placement.  Thank you for this interesting consult.  I greatly enjoyed meeting Tricia Hernandez and look forward to participating in their care.  A copy of this report was sent to the requesting provider on this date.  Electronically Signed: Greggory Keen 03/25/2017, 4:37 PM   I spent a total of  60 Minutes   in face to face in clinical consultation, greater than 50% of which was counseling/coordinating care for this patient with prior pulmonary embolism

## 2017-03-25 NOTE — Progress Notes (Signed)
CARDIOLOGY OFFICE NOTE  Date:  03/25/2017    Tricia Hernandez Date of Birth: [birth year] Medical Record #[id number]  PCP:  Flossie Buffy, NP  Cardiologist:  Taylor/Camnitz  Chief Complaint  Patient presents with  . Bradycardia    1 week check - seen for Dr. Alroy Bailiff    History of Present Illness: Tricia Hernandez is a 83 y.o. female who presents today for a follow up visit. Seen for Dr. Alroy Bailiff.   She has a history of atrial fibrillation, CHF, and obesity. She has very rapid rates apparently when she is in atrial fibrillation from prior notes. Her symptoms from her atrial fibrillation are fatigue and shortness of breath.   Seen here last week for evaluation of bradycardia.  She had had an episode of syncope earlier last week. According to her daughter, she was sitting at the table, and said that she was going to pass out. When she awoke she did not have any major issues. She did not have bowel or bladder incontinence. Her mental status had not changed. She had previously been to her primary physician's office and was noted to be bradycardic, and her blood pressure was low. Her daughter stopped her metoprolol. Ekg showed an atrial rhythm - she was bradycardic - metoprolol was discontinued and no indication for PPM last week.   Also noted that her Eliquis has been stopped - looks like she is to have IVC filter placed.   Comes in today. Here with her daughter Tricia Hernandez. She is doing well. No syncope. Had a "spell" on Sunday - got a taste of cucumber on her lips - got hot - her head had a funny feeling. Says her vitals were ok. She had not eaten any cucumbers. Headed to [place] after this visit today for IVC filter placement.   Past Medical History:  Diagnosis Date  . 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 Anemia   . Anxiety   . Atrial fibrillation ([place])   . Basal cell carcinoma of skin of nose   . CHF (congestive  heart failure) ([place]) 11/2015  . Chronic bronchitis ([place])   . Chronic depression   . Colostomy in place [facility]) 07/2016  . Diverticular disease   . Fibrocystic disease of breast   . Fracture of shoulder   . GERD (gastroesophageal reflux disease)   . GI bleed 12/10/2016   in setting of no PPI. 5/2 EGD: grade A reflux esophagitis.  Mild, non-hemorrhagic gastritis.  Ablation of 3 Duodenal AVMs  . Herpes zoster   . History of blood transfusion    for vaginal, uterine bleeding leading to hysterectomy.  in 12/2016 transfused x 1 for GI bleed.   Marland Kitchen History of hiatal hernia   . Hyperlipidemia   . Hypertension   . Labyrinthitis   . Lung cancer ([place])    RML  . Obesity   . Paroxysmal supraventricular tachycardia ([place])   . Peptic ulcer of stomach    hx  . Pneumonia 1980s X 1; 08/2016   "walking; double"  . Pulmonary embolism ([place]) 05/2014   "left lung"  . Vitamin D deficiency     Past Surgical History:  Procedure Laterality Date  . APPENDECTOMY  1959  . BASAL CELL CARCINOMA EXCISION     nose  . BREAST CYST ASPIRATION Right 2016?  Marland Kitchen CATARACT EXTRACTION W/ INTRAOCULAR LENS  IMPLANT, BILATERAL Bilateral 1990s  . CHOLECYSTECTOMY OPEN  1980s  .  COLOSTOMY N/A 07/29/2016   Procedure: COLOSTOMY;  Surgeon: Clovis Riley, MD;  Location: [place];  Service: General;  Laterality: N/A;  . DILATION AND CURETTAGE OF UTERUS    . ESOPHAGOGASTRODUODENOSCOPY N/A 12/11/2016   Procedure: ESOPHAGOGASTRODUODENOSCOPY (EGD);  Surgeon: Jerene Bears, MD;  Location[facility] ENDOSCOPY;  Service: Endoscopy;  Laterality: N/A;  . EVALUATION UNDER ANESTHESIA WITH HEMORRHOIDECTOMY AND PROCTOSCOPY N/A 07/25/2016   Procedure: EXAM UNDER ANESTHESIA, EXCISION PERIANAL MASS.;  Surgeon: Judeth Horn, MD;  Location: [place];  Service: General;  Laterality: N/A;  Prone position  . LAPAROSCOPIC DIVERTED COLOSTOMY N/A 07/29/2016   Procedure: ATTEMPTED LAPAROSCOPIC ASSISTED COLOSTOMY;  Surgeon: Clovis Riley, MD;  Location: [place];   Service: General;  Laterality: N/A;  . LUNG REMOVAL, PARTIAL Right 2013   RML mass/carcinoid, Dr Lurena Nida  . TONSILLECTOMY AND ADENOIDECTOMY  1960s  . VAGINAL HYSTERECTOMY  1959   "partial"     Medications: Current Meds  Medication Sig  . arformoterol (BROVANA) 15 MCG/2ML NEBU Take 2 mLs (15 mcg total) by nebulization 2 (two) times daily. Dx copd: J44.9  . atorvastatin (LIPITOR) 20 MG tablet Take 1 tablet (20 mg total) by mouth daily.  . budesonide (PULMICORT) 0.5 MG/2ML nebulizer solution Take 2 mLs (0.5 mg total) by nebulization 2 (two) times daily. Dx code: j58.9  . Calcium 600-200 MG-UNIT tablet Take 1 tablet by mouth daily.  Marland Kitchen dofetilide (TIKOSYN) 500 MCG capsule Take 1 capsule (500 mcg total) by mouth 2 (two) times daily.  Marland Kitchen ENSURE PLUS (ENSURE PLUS) LIQD Take 237 mLs by mouth daily.   . ergocalciferol (VITAMIN D2) 50000 units capsule Take 1 capsule (50,000 Units total) by mouth once a week.  . fenofibrate 160 MG tablet Take 1 tablet (160 mg total) by mouth daily.  . ferrous sulfate 325 (65 FE) MG EC tablet Take 325 mg by mouth 2 (two) times daily.  . fluticasone (FLONASE) 50 MCG/ACT nasal spray Place 1 spray into both nostrils daily.  . furosemide (LASIX) 20 MG tablet Take 1 tablet (20 mg total) by mouth daily.  Marland Kitchen ipratropium-albuterol (DUONEB) 0.5-2.5 (3) MG/3ML SOLN Take 3 mLs by nebulization 2 (two) times daily.  . magnesium oxide (MAG-OX) 400 (241.3 Mg) MG tablet Take 1 tablet (400 mg total) by mouth daily.  . montelukast (SINGULAIR) 10 MG tablet Take 1 tablet (10 mg total) by mouth at bedtime.  [employer] KIT [id number] is number on bag one piece unit. Colostomy bag and adhesive and alcohol prep, no sting, cannot use regular prep pad.  . pantoprazole (PROTONIX) 40 MG tablet Take 1 tablet (40 mg total) by mouth daily.  Marland Kitchen PARoxetine (PAXIL) 20 MG tablet Take 1 tablet (20 mg total) by mouth at bedtime.  . senna-docusate (SENOKOT-S) 8.6-50 MG tablet Take 1 tablet by mouth at  bedtime as needed for mild constipation.  . [DISCONTINUED] ferrous sulfate 325 (65 FE) MG tablet Take 1 tablet (325 mg total) by mouth daily with breakfast. (Patient taking differently: Take 650 mg by mouth daily with breakfast. )     Allergies: Allergies  Allergen Reactions  . Penicillins Anaphylaxis    Reaction:  Unknown  Has patient had a PCN reaction causing immediate rash, facial/tongue/throat swelling, SOB or lightheadedness with hypotension: Unsure Has patient had a PCN reaction causing severe rash involving mucus membranes or skin necrosis: Unsure Has patient had a PCN reaction that required hospitalization Unsure Has patient had a PCN reaction occurring within the last 10 years: Unsure If all  of the above answers are "NO", then may proceed with Cephalosporin use.   [name] [Naproxen Sodium] Hives  . Antihistamines, Chlorpheniramine-Type Rash    Reaction:  Unknown   . Aspirin Rash  . Benadryl [Diphenhydramine Hcl] Palpitations    Increases heart rate  . Codeine Rash  . Hydrochlorothiazide Rash  . Rocephin [Ceftriaxone Sodium In Dextrose] Rash  . Sulfa Antibiotics Rash    Social History: The patient  reports that she quit smoking about 42 years ago. Her smoking use included Cigarettes. She has a 7.50 pack-year smoking history. She has never used smokeless tobacco. She reports that she does not drink alcohol or use drugs.   Family History: The patient's family history includes Breast cancer in her sister; CVA in her mother; Cancer in her brother; Cervical cancer in her daughter; Heart Problems in her brother; Heart attack in her mother; Hypertension in her mother; Leukemia in her daughter and son; Multiple myeloma in her mother; Prostate cancer in her brother; Skin cancer in her sister; Throat cancer in her brother and brother.   Review of Systems: Please see the history of present illness.   Otherwise, the review of systems is positive for none.   All other systems are  reviewed and negative.   Physical Exam: VS:  BP (!) 120/56 (BP Location: Left Arm, Patient Position: Sitting, Cuff Size: Large)   Pulse (!) 59   Ht 5' 4"  (1.626 m)   Wt 237 lb 9.6 oz (107.8 kg)   BMI 40.78 kg/m  .  BMI Body mass index is 40.78 kg/m.  Wt Readings from Last 3 Encounters:  03/25/17 237 lb 9.6 oz (107.8 kg)  03/20/17 241 lb 6.4 oz (109.5 kg)  03/18/17 242 lb 8 oz (110 kg)    General: Pleasant. Well developed, well nourished and in no acute distress.   HEENT: Normal.  Neck: Supple, no JVD, carotid bruits, or masses noted.  Cardiac: Regular rate and rhythm. No murmurs, rubs, or gallops. No edema.  Respiratory:  Lungs are clear to auscultation bilaterally with normal work of breathing.  GI: Soft and nontender.  MS: No deformity or atrophy. Gait and ROM intact.  Skin: Warm and dry. Color is normal.  Neuro:  Strength and sensation are intact and no gross focal deficits noted.  Psych: Alert, appropriate and with normal affect.   LABORATORY DATA:  EKG:  EKG is ordered today. This demonstrates sinus - rate of 59 - short PR QT is 504 and QTc is 498 - little worse compared to last EKG (522/485 respectively).  Lab Results  Component Value Date   WBC 5.3 03/20/2017   HGB 9.5 (L) 03/20/2017   HCT 31.8 (L) 03/20/2017   PLT 158 03/20/2017   GLUCOSE 95 02/28/2017   CHOL 144 11/04/2016   TRIG 145.0 11/04/2016   HDL 46.20 11/04/2016   LDLCALC 69 11/04/2016   ALT 10 (L) 02/28/2017   AST 21 02/28/2017   NA 140 02/28/2017   K 4.3 02/28/2017   CL 99 (L) 02/28/2017   CREATININE 0.98 02/28/2017   BUN 19 02/28/2017   CO2 33 (H) 02/28/2017   TSH 1.558 12/11/2016   INR 1.34 12/11/2016     BNP (last 3 results)  Recent Labs  12/10/16 1005 02/22/17 0542 02/28/17 1600  BNP 328.7* 411.7* 333.8*    ProBNP (last 3 results)  Recent Labs  02/21/17 1203  PROBNP 354.0*     Other Studies Reviewed Today:  Additional studies/ records that were reviewed  today include:  TTE 12/13/16  Review of the above records today demonstrates:  - Left ventricle: The cavity size was normal. Wall thickness was increased in a pattern of mild LVH. Systolic function was normal. The estimated ejection fraction was in the range of 55% to 60%. Wall motion was normal; there were no regional wall motion abnormalities. The study is not technically sufficient to allow evaluation of LV diastolic function. - Aortic valve: Trileaflet. Sclerosis without stenosis. There was no regurgitation. - Right ventricle: The cavity size was normal. Systolic function is mildly reduced. - Tricuspid valve: There was mild regurgitation. - Pulmonary arteries: PA peak pressure: 39 mm Hg (S). - Inferior vena cava: The vessel was normal in size. The respirophasic diameter changes were in the normal range (>= 50%), consistent with normal central venous pressure.   ASSESSMENT AND PLAN:  1.  Atrial fibrillation: Currently on dofetilide maintaining sinus rhythm. She is now off of her Eliquis due to bleeding - having IVC filter placed later today.   2. Obesity: not discussed today  3. Hypertension: BP is ok on her current regimen - no changes made.   4. Chronic diastolic heart failure: Class II symptoms. No signs of volume overload.  5. Syncope: Possibly due to bradycardia. Her heart rate continues to improve. She is in sinus. I have left her on her current regimen. She will need to see Dr. Lovena Le back with EKG - recheck for her Tikosyn. Clinically she is greatly improved. No recurrent spells. Not sure what to make of the "tasting cucumber spell".   6. Anemia - off Eliquis - seeing GI/oncology. IVC filter later today at [place].   Current medicines are reviewed with the patient today.  The patient does not have concerns regarding medicines other than what has been noted above.  The following changes have been made:  See above.  Labs/ tests ordered today include:      Orders Placed This Encounter  Procedures  . EKG 12-Lead     Disposition:   She is to see Dr. Lovena Le back in 4 to 6 weeks with EKG - his scheduler will arrange and call the daughter (unable to place on AVS due to EPIC failure).   Patient is agreeable to this plan and will call if any problems develop in the interim.   SignedTruitt Merle, NP  03/25/2017 2:31 PM  [facility] [address] [place], [place]  [address] Phone: [phone] Fax: [phone]

## 2017-03-26 ENCOUNTER — Telehealth: Payer: Self-pay

## 2017-03-26 ENCOUNTER — Other Ambulatory Visit (HOSPITAL_COMMUNITY): Payer: Self-pay | Admitting: Interventional Radiology

## 2017-03-26 DIAGNOSIS — Z86711 Personal history of pulmonary embolism: Secondary | ICD-10-CM

## 2017-03-26 DIAGNOSIS — F329 Major depressive disorder, single episode, unspecified: Secondary | ICD-10-CM | POA: Diagnosis not present

## 2017-03-26 DIAGNOSIS — I110 Hypertensive heart disease with heart failure: Secondary | ICD-10-CM | POA: Diagnosis not present

## 2017-03-26 DIAGNOSIS — I4891 Unspecified atrial fibrillation: Secondary | ICD-10-CM

## 2017-03-26 DIAGNOSIS — K922 Gastrointestinal hemorrhage, unspecified: Secondary | ICD-10-CM

## 2017-03-26 DIAGNOSIS — I481 Persistent atrial fibrillation: Secondary | ICD-10-CM | POA: Diagnosis not present

## 2017-03-26 DIAGNOSIS — I5030 Unspecified diastolic (congestive) heart failure: Secondary | ICD-10-CM | POA: Diagnosis not present

## 2017-03-26 DIAGNOSIS — J441 Chronic obstructive pulmonary disease with (acute) exacerbation: Secondary | ICD-10-CM | POA: Diagnosis not present

## 2017-03-26 DIAGNOSIS — K219 Gastro-esophageal reflux disease without esophagitis: Secondary | ICD-10-CM | POA: Diagnosis not present

## 2017-03-26 LAB — BETA-2 GLYCOPROTEIN ANTIBODIES
Beta-2 Glyco I IgG: <9 SGU (ref <=20)
Beta-2-Glycoprotein I IgA: <9 SAU (ref <=20)
Beta-2-Glycoprotein I IgM: <9 SMU (ref <=20)

## 2017-03-26 LAB — HOMOCYSTEINE: HOMOCYSTEINE: 21.4 umol/L — AB (ref <10.4)

## 2017-03-26 LAB — CARDIOLIPIN ANTIBODIES, IGG, IGM, IGA
Anticardiolipin IgA: <11 [APL'U]
Anticardiolipin IgG: <14 [GPL'U]
Anticardiolipin IgM: <12 [MPL'U]

## 2017-03-26 NOTE — Telephone Encounter (Signed)
  Called and told Collie Siad , Betina's daughter that Mom's hgb is now 10.0 g/dl .  She was very happy to hear this and will call and tell her Mom now.

## 2017-03-27 ENCOUNTER — Telehealth: Payer: Self-pay | Admitting: Pulmonary Disease

## 2017-03-27 LAB — PROTEIN S, TOTAL: PROTEIN S ANTIGEN, TOTAL: 98 % (ref 70–140)

## 2017-03-27 LAB — ANTITHROMBIN III: ANTITHROMB III FUNC: 99 %{activity} (ref 80–120)

## 2017-03-27 LAB — PROTEIN S ACTIVITY: Protein S Activity: 93 % (ref 60–140)

## 2017-03-27 LAB — PROTEIN C ACTIVITY: Protein C Activity: 136 % (ref 70–180)

## 2017-03-27 NOTE — Telephone Encounter (Signed)
[employer] and spoke to [place]. This issue has been handled. Katie apologized for any confusion. Nothing further was needed.

## 2017-03-28 ENCOUNTER — Encounter: Payer: Self-pay | Admitting: *Deleted

## 2017-03-28 DIAGNOSIS — I110 Hypertensive heart disease with heart failure: Secondary | ICD-10-CM | POA: Diagnosis not present

## 2017-03-28 DIAGNOSIS — F329 Major depressive disorder, single episode, unspecified: Secondary | ICD-10-CM | POA: Diagnosis not present

## 2017-03-28 DIAGNOSIS — I5030 Unspecified diastolic (congestive) heart failure: Secondary | ICD-10-CM | POA: Diagnosis not present

## 2017-03-28 DIAGNOSIS — J441 Chronic obstructive pulmonary disease with (acute) exacerbation: Secondary | ICD-10-CM | POA: Diagnosis not present

## 2017-03-28 DIAGNOSIS — K219 Gastro-esophageal reflux disease without esophagitis: Secondary | ICD-10-CM | POA: Diagnosis not present

## 2017-03-28 DIAGNOSIS — I481 Persistent atrial fibrillation: Secondary | ICD-10-CM | POA: Diagnosis not present

## 2017-03-28 LAB — RFX DRVVT SCR W/RFLX CONF 1:1 MIX: dRVVT Screen: 32 s (ref <=45)

## 2017-03-28 LAB — RFX PTT-LA W/RFX TO HEX PHASE CONF: PTT-LA SCREEN: 29 s (ref <=40)

## 2017-03-28 LAB — LUPUS ANTICOAGULANT PANEL

## 2017-03-30 LAB — PROTHROMBIN GENE MUTATION

## 2017-03-30 LAB — FACTOR 5 LEIDEN

## 2017-03-31 ENCOUNTER — Ambulatory Visit
Admission: RE | Admit: 2017-03-31 | Discharge: 2017-03-31 | Disposition: A | Discharge status: Home / Self Care | Payer: Medicare Other | Source: Ambulatory Visit | Attending: Interventional Radiology | Admitting: Interventional Radiology

## 2017-03-31 DIAGNOSIS — K922 Gastrointestinal hemorrhage, unspecified: Secondary | ICD-10-CM

## 2017-03-31 DIAGNOSIS — Z7901 Long term (current) use of anticoagulants: Secondary | ICD-10-CM | POA: Diagnosis not present

## 2017-03-31 DIAGNOSIS — Z86711 Personal history of pulmonary embolism: Secondary | ICD-10-CM

## 2017-03-31 DIAGNOSIS — I4891 Unspecified atrial fibrillation: Secondary | ICD-10-CM

## 2017-04-01 ENCOUNTER — Telehealth: Payer: Self-pay

## 2017-04-01 NOTE — Telephone Encounter (Signed)
Received detailed written order for Pulmicort 0.5 from [facility]. Signed form has been faxed to provided number. Received successful fax confirmation. Nothing further needed.

## 2017-04-02 DIAGNOSIS — I110 Hypertensive heart disease with heart failure: Secondary | ICD-10-CM | POA: Diagnosis not present

## 2017-04-02 DIAGNOSIS — I5030 Unspecified diastolic (congestive) heart failure: Secondary | ICD-10-CM | POA: Diagnosis not present

## 2017-04-02 DIAGNOSIS — K219 Gastro-esophageal reflux disease without esophagitis: Secondary | ICD-10-CM | POA: Diagnosis not present

## 2017-04-02 DIAGNOSIS — J441 Chronic obstructive pulmonary disease with (acute) exacerbation: Secondary | ICD-10-CM | POA: Diagnosis not present

## 2017-04-02 DIAGNOSIS — I481 Persistent atrial fibrillation: Secondary | ICD-10-CM | POA: Diagnosis not present

## 2017-04-02 DIAGNOSIS — F329 Major depressive disorder, single episode, unspecified: Secondary | ICD-10-CM | POA: Diagnosis not present

## 2017-04-03 ENCOUNTER — Telehealth: Payer: Self-pay | Admitting: Radiology

## 2017-04-03 NOTE — Telephone Encounter (Signed)
No answer on patient's home #.  Phoned patient's daughter Tricia Hernandez to review [place] results of 03/31/2017 (per Dr Fritz Pickerel instruction):  No DVT on [place].  Does not recommend IVC filter.  Instructed to follow up with Dr. Lindi Adie.    Tricia Hernandez, [place] 04/03/2017 10:12 AM

## 2017-04-04 DIAGNOSIS — F329 Major depressive disorder, single episode, unspecified: Secondary | ICD-10-CM | POA: Diagnosis not present

## 2017-04-04 DIAGNOSIS — Z5181 Encounter for therapeutic drug level monitoring: Secondary | ICD-10-CM | POA: Diagnosis not present

## 2017-04-04 DIAGNOSIS — I481 Persistent atrial fibrillation: Secondary | ICD-10-CM | POA: Diagnosis not present

## 2017-04-04 DIAGNOSIS — K219 Gastro-esophageal reflux disease without esophagitis: Secondary | ICD-10-CM | POA: Diagnosis not present

## 2017-04-04 DIAGNOSIS — I5030 Unspecified diastolic (congestive) heart failure: Secondary | ICD-10-CM | POA: Diagnosis not present

## 2017-04-04 DIAGNOSIS — D649 Anemia, unspecified: Secondary | ICD-10-CM | POA: Diagnosis not present

## 2017-04-04 DIAGNOSIS — I110 Hypertensive heart disease with heart failure: Secondary | ICD-10-CM | POA: Diagnosis not present

## 2017-04-04 DIAGNOSIS — J441 Chronic obstructive pulmonary disease with (acute) exacerbation: Secondary | ICD-10-CM | POA: Diagnosis not present

## 2017-04-08 ENCOUNTER — Telehealth: Payer: Self-pay

## 2017-04-08 DIAGNOSIS — D500 Iron deficiency anemia secondary to blood loss (chronic): Secondary | ICD-10-CM

## 2017-04-08 NOTE — Telephone Encounter (Signed)
Daughter calling about results of the CBC from 8/24.  They are on your desk.  How often does she need repeat CBC?

## 2017-04-08 NOTE — Telephone Encounter (Signed)
Left message for patient to call back  

## 2017-04-08 NOTE — Telephone Encounter (Signed)
Patient's daughter notified She will take her to [facility] in [place] for lab draws.  I mailed her the orders

## 2017-04-08 NOTE — Telephone Encounter (Signed)
Hgb 10 .4  I would do them every 2 weeks for now  Dx chronic blood loss anemia

## 2017-04-09 ENCOUNTER — Ambulatory Visit: Payer: Medicare Other | Admitting: Internal Medicine

## 2017-04-15 ENCOUNTER — Telehealth: Payer: Self-pay | Admitting: Nurse Practitioner

## 2017-04-15 MED ORDER — MAGNESIUM OXIDE 400 (241.3 MG) MG PO TABS: 400.0000 mg | ORAL_TABLET | Freq: Every day | ORAL | 0 refills | Status: DC

## 2017-04-15 MED ORDER — PANTOPRAZOLE SODIUM 40 MG PO TBEC: 40.0000 mg | DELAYED_RELEASE_TABLET | Freq: Every day | ORAL | 0 refills | Status: DC

## 2017-04-15 NOTE — Telephone Encounter (Signed)
Requesting refill on magnesium oxide and patoprazole to be sent to [facility] in [place].  Requesting 90 day scripts.

## 2017-04-15 NOTE — Telephone Encounter (Signed)
Reviewed chart pt is up-to-date sent refills to [facility].../lmb  

## 2017-04-16 ENCOUNTER — Other Ambulatory Visit: Payer: Self-pay | Admitting: Internal Medicine

## 2017-04-16 DIAGNOSIS — D500 Iron deficiency anemia secondary to blood loss (chronic): Secondary | ICD-10-CM | POA: Diagnosis not present

## 2017-04-17 LAB — CBC WITH DIFFERENTIAL/PLATELET
BASOS: 0 %
Basophils Absolute: 0 10*3/uL (ref 0.0–0.2)
EOS (ABSOLUTE): 0.2 10*3/uL (ref 0.0–0.4)
EOS: 4 %
HEMATOCRIT: 36 % (ref 34.0–46.6)
Hemoglobin: 11.4 g/dL (ref 11.1–15.9)
IMMATURE GRANS (ABS): 0 10*3/uL (ref 0.0–0.1)
IMMATURE GRANULOCYTES: 0 %
Lymphocytes Absolute: 1.3 10*3/uL (ref 0.7–3.1)
Lymphs: 27 %
MCH: 26.7 pg (ref 26.6–33.0)
MCHC: 31.7 g/dL (ref 31.5–35.7)
MCV: 84 fL (ref 79–97)
MONOS ABS: 0.4 10*3/uL (ref 0.1–0.9)
Monocytes: 7 %
NEUTROS PCT: 62 %
Neutrophils Absolute: 3 10*3/uL (ref 1.4–7.0)
Platelets: 176 10*3/uL (ref 150–379)
RBC: 4.27 x10E6/uL (ref 3.77–5.28)
RDW: 16.3 % — AB (ref 12.3–15.4)
WBC: 4.8 10*3/uL (ref 3.4–10.8)

## 2017-04-21 ENCOUNTER — Ambulatory Visit: Payer: Medicare Other | Admitting: Internal Medicine

## 2017-04-21 ENCOUNTER — Telehealth: Payer: Self-pay

## 2017-04-21 NOTE — Telephone Encounter (Signed)
Patient's daughter notified of the results and new timing of repeat labs

## 2017-04-21 NOTE — Progress Notes (Signed)
Showed up in [facility] also  Paper result reviewed  To notify daughter results and repeat CBC 1 month

## 2017-04-21 NOTE — Telephone Encounter (Signed)
Left message for patient's daughter to call back to discuss lab results.  Per Dr. Carlean Purl need to repeat CBC in 1 month instead of the planned 2 weeks.  Hgb 11.4 , Hct. 36

## 2017-04-29 DIAGNOSIS — Z1231 Encounter for screening mammogram for malignant neoplasm of breast: Secondary | ICD-10-CM | POA: Diagnosis not present

## 2017-05-13 ENCOUNTER — Ambulatory Visit: Payer: Medicare Other | Admitting: Internal Medicine

## 2017-05-25 NOTE — Progress Notes (Signed)
Cardiology Office Note Date:  05/27/2017  Patient ID:  Tricia Hernandez, Tricia Hernandez Dec 10, 1930, MRN [id number] PCP:  Flossie Buffy, NP  Electrophysiologist:  Dr. Lovena Le Pulmonary: Dr. Concepcion Living GI: Dr. Carlean Purl    Chief Complaint: cardiology f/u  History of Present Illness: Tricia Hernandez is a 83 y.o. female with history of AFib (difficult to control), HTN, diastolic chronic CHF, obesity, HLD, Lung cancer treated surgically, anal cancer, has colostomy, chronic bronchitis/COPD follows with pulmonary.  In review of records historically her AFib has been difficult to control, though at her last visit with Dr. Lovena Le she was maintaining SR, she was seen by Dr. Curt Bears in August after a syncopal event, at the time of his visit her BB had been held in response to the syncopal event and her HR/rhythm was a lo atrial rhythm in the 50's, did not feel any further was needed at the time unless recurrent syncope.  She was off her Eliquis 2/2 GIB associated with small bowel AVMs  She saw L. Servando Snare, NP in August to f/u without recurrent syncope, was off her Eliquis for IVC filter placement.  [facility] radiology suggested holding off IVC filter and evaluating for hypercoagulable state and check ing LE venous US.   The patient comes accompanied by her daughter who helps with HPI/current issues.  She states it has been decided given AVMs that she can no longer be on a/c.  ( I note in July brief GI note with capsule endoscopy with ongoing bleeding and requiring of a number of transfusions) Dr. Lovena Le was in agreement, best to stop a/c.  Since stopping she has not had any further bleeding.  The patient feels like she is doing well in general.  She denies any kind of cardiac awareness, no CP or palpitations, no symptoms that have made her suspect she has been out of rhythm.  No dizziness since her blood counts improved, no near near syncope or syncope.  She sleeps well without symptoms of PND or orthopnea, she cleans  her house and has no trouble with her ADLs, ambulates with a walker, denies SOB or DOE.   Past Medical History:  Diagnosis Date  . 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 Anemia   . Anxiety   . Atrial fibrillation ([place])   . Basal cell carcinoma of skin of nose   . CHF (congestive heart failure) ([place]) 11/2015  . Chronic bronchitis ([place])   . Chronic depression   . Colostomy in place [facility]) 07/2016  . Diverticular disease   . Fibrocystic disease of breast   . Fracture of shoulder   . GERD (gastroesophageal reflux disease)   . GI bleed 12/10/2016   in setting of no PPI. 5/2 EGD: grade A reflux esophagitis.  Mild, non-hemorrhagic gastritis.  Ablation of 3 Duodenal AVMs  . Herpes zoster   . History of blood transfusion    for vaginal, uterine bleeding leading to hysterectomy.  in 12/2016 transfused x 1 for GI bleed.   Marland Kitchen History of hiatal hernia   . Hyperlipidemia   . Hypertension   . Labyrinthitis   . Lung cancer ([place])    RML  . Obesity   . Paroxysmal supraventricular tachycardia ([place])   . Peptic ulcer of stomach    hx  . Pneumonia 1980s X 1; 08/2016   "walking; double"  . Pulmonary embolism ([place]) 05/2014   "left lung"  . Vitamin D deficiency  Past Surgical History:  Procedure Laterality Date  . APPENDECTOMY  1959  . BASAL CELL CARCINOMA EXCISION     nose  . BREAST CYST ASPIRATION Right 2016?  Marland Kitchen CATARACT EXTRACTION W/ INTRAOCULAR LENS  IMPLANT, BILATERAL Bilateral 1990s  . CHOLECYSTECTOMY OPEN  1980s  . COLOSTOMY N/A 07/29/2016   Procedure: COLOSTOMY;  Surgeon: Clovis Riley, MD;  Location: [place];  Service: General;  Laterality: N/A;  . DILATION AND CURETTAGE OF UTERUS    . ESOPHAGOGASTRODUODENOSCOPY N/A 12/11/2016   Procedure: ESOPHAGOGASTRODUODENOSCOPY (EGD);  Surgeon: Jerene Bears, MD;  Location: [facility] ENDOSCOPY;  Service: Endoscopy;  Laterality: N/A;  . EVALUATION UNDER ANESTHESIA WITH HEMORRHOIDECTOMY AND PROCTOSCOPY N/A  07/25/2016   Procedure: EXAM UNDER ANESTHESIA, EXCISION PERIANAL MASS.;  Surgeon: Judeth Horn, MD;  Location: [place];  Service: General;  Laterality: N/A;  Prone position  . IR RADIOLOGIST EVAL & MGMT  03/25/2017  . LAPAROSCOPIC DIVERTED COLOSTOMY N/A 07/29/2016   Procedure: ATTEMPTED LAPAROSCOPIC ASSISTED COLOSTOMY;  Surgeon: Clovis Riley, MD;  Location: [place];  Service: General;  Laterality: N/A;  . LUNG REMOVAL, PARTIAL Right 2013   RML mass/carcinoid, Dr Lurena Nida  . TONSILLECTOMY AND ADENOIDECTOMY  1960s  . VAGINAL HYSTERECTOMY  1959   "partial"    Current Outpatient Prescriptions  Medication Sig Dispense Refill  . arformoterol (BROVANA) 15 MCG/2ML NEBU Take 2 mLs (15 mcg total) by nebulization 2 (two) times daily. Dx copd: J44.9 120 mL 5  . atorvastatin (LIPITOR) 20 MG tablet Take 1 tablet (20 mg total) by mouth daily. 90 tablet 1  . budesonide (PULMICORT) 0.5 MG/2ML nebulizer solution Take 2 mLs (0.5 mg total) by nebulization 2 (two) times daily. Dx code: j44.9 120 mL 5  . Calcium 600-200 MG-UNIT tablet Take 1 tablet by mouth daily.    Marland Kitchen dofetilide (TIKOSYN) 500 MCG capsule Take 1 capsule (500 mcg total) by mouth 2 (two) times daily. 180 capsule 3  . ergocalciferol (VITAMIN D2) 50000 units capsule Take 1 capsule (50,000 Units total) by mouth once a week. 12 capsule 3  . fenofibrate 160 MG tablet Take 1 tablet (160 mg total) by mouth daily. 90 tablet 1  . ferrous sulfate 325 (65 FE) MG EC tablet Take 325 mg by mouth 2 (two) times daily.    . fluticasone (FLONASE) 50 MCG/ACT nasal spray Place 1 spray into both nostrils daily.    . furosemide (LASIX) 20 MG tablet Take 1 tablet (20 mg total) by mouth daily. 90 tablet 0  . ipratropium-albuterol (DUONEB) 0.5-2.5 (3) MG/3ML SOLN Take 3 mLs by nebulization 2 (two) times daily. 540 mL 0  . magnesium oxide (MAG-OX) 400 (241.3 Mg) MG tablet Take 1 tablet (400 mg total) by mouth daily. Follow-up appt is due for future refills 90 tablet 0  .  montelukast (SINGULAIR) 10 MG tablet Take 1 tablet (10 mg total) by mouth at bedtime. 90 tablet 1  . Ostomy Supplies KIT 8514 is number on bag one piece unit. Colostomy bag and adhesive and alcohol prep, no sting, cannot use regular prep pad. 30 each 11  . pantoprazole (PROTONIX) 40 MG tablet Take 1 tablet (40 mg total) by mouth daily. Follow-up appt is due for future refills 90 tablet 0  . PARoxetine (PAXIL) 20 MG tablet Take 1 tablet (20 mg total) by mouth at bedtime. 90 tablet 0   No current facility-administered medications for this visit.     Allergies:   Penicillins; Aleve [naproxen sodium]; Antihistamines, chlorpheniramine-type; Aspirin; Benadryl [diphenhydramine  hcl]; Codeine; Hydrochlorothiazide; Rocephin [ceftriaxone sodium in dextrose]; and Sulfa antibiotics   Social History:  The patient  reports that she quit smoking about 42 years ago. Her smoking use included Cigarettes. She has a 7.50 pack-year smoking history. She has never used smokeless tobacco. She reports that she does not drink alcohol or use drugs.   Family History:  The patient's family history includes Breast cancer in her sister; CVA in her mother; Cancer in her brother; Cervical cancer in her daughter; Heart Problems in her brother; Heart attack in her mother; Hypertension in her mother; Leukemia in her daughter and son; Multiple myeloma in her mother; Prostate cancer in her brother; Skin cancer in her sister; Throat cancer in her brother and brother.  ROS:  Please see the history of present illness.  All other systems are reviewed and otherwise negative.   PHYSICAL EXAM:  VS:  BP 130/72   Pulse (!) 55   Ht 5' 4"  (1.626 m)   Wt 226 lb (102.5 kg)   BMI 38.79 kg/m  BMI: Body mass index is 38.79 kg/m. Well nourished, well developed, in no acute distress  HEENT: normocephalic, atraumatic  Neck: no JVD, carotid bruits or masses Cardiac:  RRR; 1/6 SM, no rubs, or gallops Lungs:  CTA b/l, no wheezing, rhonchi or  rales  Abd: soft, nontender MS: no deformity, age appropriate atrophy Ext: no edema  Skin: warm and dry, no rash Neuro:  No gross deficits appreciated Psych: euthymic mood, full affect   EKG:  Done today and reviewed by myself shows SB 55bpm, PR 180m, QRS 762m QTc [id number] 12/13/16  Review of the above records today demonstrates:  - Left ventricle: The cavity size was normal. Wall thickness was increased in a pattern of mild LVH. Systolic function was normal. The estimated ejection fraction was in the range of 55% to 60%. Wall motion was normal; there were no regional wall motion abnormalities. The study is not technically sufficient to allow evaluation of LV diastolic function. - Aortic valve: Trileaflet. Sclerosis without stenosis. There was no regurgitation. - Right ventricle: The cavity size was normal. Systolic function is mildly reduced. - Tricuspid valve: There was mild regurgitation. - Pulmonary arteries: PA peak pressure: 39 mm Hg (S). - Inferior vena cava: The vessel was normal in size. The respirophasic diameter changes were in the normal range (>= 50%), consistent with normal central venous pressure.  Recent Labs: 12/10/2016: Magnesium 2.1 12/11/2016: TSH 1.558 02/21/2017: Pro B Natriuretic peptide (BNP) 354.0 02/28/2017: ALT 10; B Natriuretic Peptide 333.8; BUN 19; Creatinine, Ser 0.98; Potassium 4.3; Sodium 140 04/16/2017: Hemoglobin 11.4; Platelets 176  11/04/2016: Cholesterol 144; HDL 46.20; LDL Cholesterol 69; Total CHOL/HDL Ratio 3; Triglycerides 145.0; VLDL 29.0   CrCl cannot be calculated (Patient's most recent lab result is older than the maximum 21 days allowed.).   Wt Readings from Last 3 Encounters:  05/27/17 226 lb (102.5 kg)  03/25/17 237 lb (107.5 kg)  03/25/17 237 lb 9.6 oz (107.8 kg)     Other studies reviewed: Additional studies/records reviewed today include: summarized above  ASSESSMENT AND PLAN:  1. Paroxysmal AFib      CHA2DS2Vasc is at least 4, no loger on a/c with recurrent GIB/AVMs     Maintaining SR, on Tikosyn, QTc OK with her Paxil     BMET and Mag today  2. HTN     Looks OK, no changes    Disposition: F/u 6 months, sooner if needed   Current medicines  are reviewed at length with the patient today.  The patient did not have any concerns regarding medicines.  Venetia Night, PA-C 05/27/2017 10:04 AM     [facility] [place] [address] [phone] (office)  [phone] (fax)

## 2017-05-26 ENCOUNTER — Encounter: Payer: Self-pay | Admitting: Interventional Radiology

## 2017-05-27 ENCOUNTER — Encounter: Payer: Self-pay | Admitting: Nurse Practitioner

## 2017-05-27 ENCOUNTER — Ambulatory Visit (INDEPENDENT_AMBULATORY_CARE_PROVIDER_SITE_OTHER): Payer: Medicare Other | Admitting: Nurse Practitioner

## 2017-05-27 ENCOUNTER — Other Ambulatory Visit: Payer: Medicare Other

## 2017-05-27 ENCOUNTER — Ambulatory Visit (INDEPENDENT_AMBULATORY_CARE_PROVIDER_SITE_OTHER): Payer: Medicare Other | Admitting: Pulmonary Disease

## 2017-05-27 ENCOUNTER — Other Ambulatory Visit (INDEPENDENT_AMBULATORY_CARE_PROVIDER_SITE_OTHER): Payer: Medicare Other

## 2017-05-27 ENCOUNTER — Ambulatory Visit (INDEPENDENT_AMBULATORY_CARE_PROVIDER_SITE_OTHER): Payer: Medicare Other | Admitting: Physician Assistant

## 2017-05-27 ENCOUNTER — Encounter: Payer: Self-pay | Admitting: Pulmonary Disease

## 2017-05-27 VITALS — BP 138/80 | HR 60 | Ht 64.25 in | Wt 226.6 lb

## 2017-05-27 VITALS — BP 130/72 | HR 55 | Ht 64.0 in | Wt 226.0 lb

## 2017-05-27 VITALS — BP 134/70 | HR 60 | Temp 97.8°F | Ht 64.25 in | Wt 227.0 lb

## 2017-05-27 DIAGNOSIS — E559 Vitamin D deficiency, unspecified: Secondary | ICD-10-CM

## 2017-05-27 DIAGNOSIS — F4323 Adjustment disorder with mixed anxiety and depressed mood: Secondary | ICD-10-CM | POA: Diagnosis not present

## 2017-05-27 DIAGNOSIS — R918 Other nonspecific abnormal finding of lung field: Secondary | ICD-10-CM | POA: Diagnosis not present

## 2017-05-27 DIAGNOSIS — Z780 Asymptomatic menopausal state: Secondary | ICD-10-CM | POA: Diagnosis not present

## 2017-05-27 DIAGNOSIS — J9601 Acute respiratory failure with hypoxia: Secondary | ICD-10-CM

## 2017-05-27 DIAGNOSIS — R739 Hyperglycemia, unspecified: Secondary | ICD-10-CM | POA: Diagnosis not present

## 2017-05-27 DIAGNOSIS — D500 Iron deficiency anemia secondary to blood loss (chronic): Secondary | ICD-10-CM

## 2017-05-27 DIAGNOSIS — R634 Abnormal weight loss: Secondary | ICD-10-CM | POA: Diagnosis not present

## 2017-05-27 DIAGNOSIS — J42 Unspecified chronic bronchitis: Secondary | ICD-10-CM

## 2017-05-27 DIAGNOSIS — I10 Essential (primary) hypertension: Secondary | ICD-10-CM | POA: Diagnosis not present

## 2017-05-27 DIAGNOSIS — I872 Venous insufficiency (chronic) (peripheral): Secondary | ICD-10-CM | POA: Diagnosis not present

## 2017-05-27 DIAGNOSIS — E782 Mixed hyperlipidemia: Secondary | ICD-10-CM

## 2017-05-27 DIAGNOSIS — Z79899 Other long term (current) drug therapy: Secondary | ICD-10-CM

## 2017-05-27 DIAGNOSIS — I480 Paroxysmal atrial fibrillation: Secondary | ICD-10-CM | POA: Diagnosis not present

## 2017-05-27 DIAGNOSIS — Z23 Encounter for immunization: Secondary | ICD-10-CM

## 2017-05-27 DIAGNOSIS — K219 Gastro-esophageal reflux disease without esophagitis: Secondary | ICD-10-CM | POA: Diagnosis not present

## 2017-05-27 LAB — IBC PANEL
Iron: 56 ug/dL (ref 42–145)
SATURATION RATIOS: 12.4 % — AB (ref 20.0–50.0)
Transferrin: 323 mg/dL (ref 212.0–360.0)

## 2017-05-27 LAB — HEMOGLOBIN A1C: Hgb A1c MFr Bld: 5.6 % (ref 4.6–6.5)

## 2017-05-27 LAB — BASIC METABOLIC PANEL
BUN/Creatinine Ratio: 22 (ref 12–28)
BUN: 22 mg/dL (ref 8–27)
BUN: 23 mg/dL (ref 6–23)
CALCIUM: 9.8 mg/dL (ref 8.7–10.3)
CHLORIDE: 101 mmol/L (ref 96–106)
CHLORIDE: 103 meq/L (ref 96–112)
CO2: 28 mmol/L (ref 20–29)
CO2: 32 mEq/L (ref 19–32)
Calcium: 9.9 mg/dL (ref 8.4–10.5)
Creatinine, Ser: 0.98 mg/dL (ref 0.57–1.00)
Creatinine, Ser: 1.1 mg/dL (ref 0.40–1.20)
GFR, EST AFRICAN AMERICAN: 62 mL/min/{1.73_m2} (ref >59)
GFR, EST NON AFRICAN AMERICAN: 54 mL/min/{1.73_m2} — AB (ref >59)
GFR: 50.31 mL/min — ABNORMAL LOW (ref >60.00)
GLUCOSE: 91 mg/dL (ref 70–99)
Glucose: 92 mg/dL (ref 65–99)
POTASSIUM: 4.9 mmol/L (ref 3.5–5.2)
POTASSIUM: 4.2 meq/L (ref 3.5–5.1)
SODIUM: 144 mmol/L (ref 134–144)
Sodium: 142 mEq/L (ref 135–145)

## 2017-05-27 LAB — HEPATIC FUNCTION PANEL
ALBUMIN: 3.9 g/dL (ref 3.5–5.2)
ALK PHOS: 43 U/L (ref 39–117)
ALT: 13 U/L (ref 0–35)
AST: 20 U/L (ref 0–37)
BILIRUBIN TOTAL: 0.4 mg/dL (ref 0.2–1.2)
Bilirubin, Direct: 0.1 mg/dL (ref 0.0–0.3)
Total Protein: 6.7 g/dL (ref 6.0–8.3)

## 2017-05-27 LAB — CBC WITH DIFFERENTIAL/PLATELET
BASOS PCT: 0.4 % (ref 0.0–3.0)
Basophils Absolute: 0 10*3/uL (ref 0.0–0.1)
EOS PCT: 5.4 % — AB (ref 0.0–5.0)
Eosinophils Absolute: 0.2 10*3/uL (ref 0.0–0.7)
HCT: 38.4 % (ref 36.0–46.0)
Hemoglobin: 12.4 g/dL (ref 12.0–15.0)
LYMPHS ABS: 1 10*3/uL (ref 0.7–4.0)
Lymphocytes Relative: 23.4 % (ref 12.0–46.0)
MCHC: 32.3 g/dL (ref 30.0–36.0)
MCV: 82.5 fl (ref 78.0–100.0)
MONO ABS: 0.4 10*3/uL (ref 0.1–1.0)
MONOS PCT: 9.6 % (ref 3.0–12.0)
NEUTROS ABS: 2.5 10*3/uL (ref 1.4–7.7)
NEUTROS PCT: 61.2 % (ref 43.0–77.0)
PLATELETS: 133 10*3/uL — AB (ref 150.0–400.0)
RBC: 4.66 Mil/uL (ref 3.87–5.11)
RDW: 16.5 % — AB (ref 11.5–15.5)
WBC: 4.1 10*3/uL (ref 4.0–10.5)

## 2017-05-27 LAB — LIPID PANEL
CHOLESTEROL: 151 mg/dL (ref 0–200)
HDL: 57.2 mg/dL (ref >39.00)
LDL CALC: 74 mg/dL (ref 0–99)
NonHDL: 93.81
Total CHOL/HDL Ratio: 3
Triglycerides: 99 mg/dL (ref 0.0–149.0)
VLDL: 19.8 mg/dL (ref 0.0–40.0)

## 2017-05-27 LAB — TSH: TSH: 1.43 u[IU]/mL (ref 0.35–4.50)

## 2017-05-27 LAB — MAGNESIUM: Magnesium: 2 mg/dL (ref 1.6–2.3)

## 2017-05-27 NOTE — Patient Instructions (Addendum)
Medication Instructions:   Your physician recommends that you continue on your current medications as directed. Please refer to the Current Medication list given to you today.   If you need a refill on your cardiac medications before your next appointment, please call your pharmacy.  Labwork:  BMET AND MAG TODAY    Testing/Procedures: NONE ORDERED  TODAY    Follow-Up:  Your physician wants you to follow-up in:  IN  6  MONTHS WITH DR Knox Saliva will receive a reminder letter in the mail two months in advance. If you don't receive a letter, please call our office to schedule the follow-up appointment.     Any Other Special Instructions Will Be Listed Below (If Applicable).

## 2017-05-27 NOTE — Progress Notes (Signed)
Subjective:  Patient ID: Tricia Hernandez, female    DOB: 17-Jan-1931  Age: 83 y.o. MRN: [id number]  CC: Follow-up (follow up-lossing weight-med consult due to weight loss-lab? slu shot and shingle shot consult? peanut consult due to hx of GI bleeding) and Leg Pain (right leg pain at times--hx of shingle in right ankle 3 mo ago--letter for postal service for put mail on door due to mobility issue?)   HPI Accompanied by daughter.  Denies any new complains  Will like labs updated  Weight loss is intentional with change in diet and increase activity.  Chronic right lower leg pain due to post herpetic pain. Stable, intermittent use of tylenol for pain with significant improvement.  Mood is stable with use of paxil. Improved mood due to return to her home from rehab facility.  Anemia: Denies any GI bleed. Still has colostomy with no problems.  Edema: Improved with furosemide use.  Hyperlipidemina: No adverse effects with lipitor and fenofibrate.  Outpatient Medications Prior to Visit  Medication Sig Dispense Refill  . arformoterol (BROVANA) 15 MCG/2ML NEBU Take 2 mLs (15 mcg total) by nebulization 2 (two) times daily. Dx copd: J44.9 120 mL 5  . budesonide (PULMICORT) 0.5 MG/2ML nebulizer solution Take 2 mLs (0.5 mg total) by nebulization 2 (two) times daily. Dx code: j44.9 120 mL 5  . Calcium 600-200 MG-UNIT tablet Take 1 tablet by mouth daily.    Marland Kitchen dofetilide (TIKOSYN) 500 MCG capsule Take 1 capsule (500 mcg total) by mouth 2 (two) times daily. 180 capsule 3  . ergocalciferol (VITAMIN D2) 50000 units capsule Take 1 capsule (50,000 Units total) by mouth once a week. 12 capsule 3  . ferrous sulfate 325 (65 FE) MG EC tablet Take 325 mg by mouth 2 (two) times daily.    . fluticasone (FLONASE) 50 MCG/ACT nasal spray Place 1 spray into both nostrils daily.    Marland Kitchen ipratropium-albuterol (DUONEB) 0.5-2.5 (3) MG/3ML SOLN Take 3 mLs by nebulization 2 (two) times daily. 540 mL 0  . Ostomy  Supplies KIT 8514 is number on bag one piece unit. Colostomy bag and adhesive and alcohol prep, no sting, cannot use regular prep pad. 30 each 11  . atorvastatin (LIPITOR) 20 MG tablet Take 1 tablet (20 mg total) by mouth daily. 90 tablet 1  . fenofibrate 160 MG tablet Take 1 tablet (160 mg total) by mouth daily. 90 tablet 1  . furosemide (LASIX) 20 MG tablet Take 1 tablet (20 mg total) by mouth daily. 90 tablet 0  . magnesium oxide (MAG-OX) 400 (241.3 Mg) MG tablet Take 1 tablet (400 mg total) by mouth daily. Follow-up appt is due for future refills 90 tablet 0  . montelukast (SINGULAIR) 10 MG tablet Take 1 tablet (10 mg total) by mouth at bedtime. 90 tablet 1  . pantoprazole (PROTONIX) 40 MG tablet Take 1 tablet (40 mg total) by mouth daily. Follow-up appt is due for future refills 90 tablet 0  . PARoxetine (PAXIL) 20 MG tablet Take 1 tablet (20 mg total) by mouth at bedtime. 90 tablet 0   No facility-administered medications prior to visit.     ROS See HPI  Objective:  BP 134/70   Pulse 60   Temp 97.8 F (36.6 C)   Ht 5' 4.25" (1.632 m)   Wt 227 lb (103 kg)   SpO2 98%   BMI 38.66 kg/m   BP Readings from Last 3 Encounters:  05/27/17 134/70  05/27/17 138/80  05/27/17 130/72  Wt Readings from Last 3 Encounters:  05/27/17 227 lb (103 kg)  05/27/17 226 lb 9.6 oz (102.8 kg)  05/27/17 226 lb (102.5 kg)    Physical Exam  Constitutional: She is oriented to person, place, and time.  Neck: Normal range of motion. Neck supple.  Cardiovascular: Normal rate and regular rhythm.   Murmur heard. Pulmonary/Chest: Effort normal and breath sounds normal.  Abdominal: Soft. Bowel sounds are normal. There is no tenderness.  LLQ colostomy  Musculoskeletal: She exhibits no edema.  Neurological: She is alert and oriented to person, place, and time.  Ambulates with walker  Skin: Skin is warm and dry. No rash noted. No erythema.  Psychiatric: She has a normal mood and affect.  Vitals  reviewed.   Lab Results  Component Value Date   WBC 4.1 05/27/2017   HGB 12.4 05/27/2017   HCT 38.4 05/27/2017   PLT 133.0 (L) 05/27/2017   GLUCOSE 91 05/27/2017   CHOL 151 05/27/2017   TRIG 99.0 05/27/2017   HDL 57.20 05/27/2017   LDLCALC 74 05/27/2017   ALT 13 05/27/2017   AST 20 05/27/2017   NA 142 05/27/2017   K 4.2 05/27/2017   CL 103 05/27/2017   CREATININE 1.10 05/27/2017   BUN 23 05/27/2017   CO2 32 05/27/2017   TSH 1.43 05/27/2017   INR 1.34 12/11/2016   HGBA1C 5.6 05/27/2017    US Venous Img Lower Bilateral  Result Date: 03/31/2017 CLINICAL DATA:  History of PE, recent GI bleed, patient can no longer be anticoagulated. IVC filter evaluation EXAM: BILATERAL LOWER EXTREMITY VENOUS DOPPLER ULTRASOUND TECHNIQUE: Gray-scale sonography with graded compression, as well as color Doppler and duplex ultrasound were performed to evaluate the lower extremity deep venous systems from the level of the common femoral vein and including the common femoral, femoral, profunda femoral, popliteal and calf veins including the posterior tibial, peroneal and gastrocnemius veins when visible. The superficial great saphenous vein was also interrogated. Spectral Doppler was utilized to evaluate flow at rest and with distal augmentation maneuvers in the common femoral, femoral and popliteal veins. COMPARISON:  None. FINDINGS: RIGHT LOWER EXTREMITY Common Femoral Vein: No evidence of thrombus. Normal compressibility, respiratory phasicity and response to augmentation. Saphenofemoral Junction: No evidence of thrombus. Normal compressibility and flow on color Doppler imaging. Profunda Femoral Vein: No evidence of thrombus. Normal compressibility and flow on color Doppler imaging. Femoral Vein: No evidence of thrombus. Normal compressibility, respiratory phasicity and response to augmentation. Popliteal Vein: No evidence of thrombus. Normal compressibility, respiratory phasicity and response to augmentation.  Calf Veins: Limited visualization. No significant occlusive thrombus Superficial Great Saphenous Vein: No evidence of thrombus. Normal compressibility and flow on color Doppler imaging. Venous Reflux:  None. Other Findings: Right popliteal fossa Baker cyst measures 6 x 3.5 x 3.0 cm. Mildly prominent right inguinal lymph node with a short axis measurement of 9 mm. LEFT LOWER EXTREMITY Common Femoral Vein: No evidence of thrombus. Normal compressibility, respiratory phasicity and response to augmentation. Saphenofemoral Junction: No evidence of thrombus. Normal compressibility and flow on color Doppler imaging. Profunda Femoral Vein: No evidence of thrombus. Normal compressibility and flow on color Doppler imaging. Femoral Vein: No evidence of thrombus. Normal compressibility, respiratory phasicity and response to augmentation. Popliteal Vein: No evidence of thrombus. Normal compressibility, respiratory phasicity and response to augmentation. Calf Veins: Limited visualization. No significant occlusive thrombus Superficial Great Saphenous Vein: No evidence of thrombus. Normal compressibility and flow on color Doppler imaging. Venous Reflux:  None. Other Findings:  None. IMPRESSION: No significant DVT in either extremity. Limited assessment of the calf veins. 6 x 3 cm right Baker's cyst Mildly prominent right inguinal lymph node with preserved architecture, remains nonspecific. Electronically Signed   By: Jerilynn Mages.  Shick M.D.   On: 03/31/2017 12:15    Assessment & Plan:   Brindy was seen today for follow-up and leg pain.  Diagnoses and all orders for this visit:  Essential hypertension -     Basic metabolic panel; Future  Mixed hyperlipidemia -     Hepatic function panel; Future -     Lipid panel; Future -     atorvastatin (LIPITOR) 20 MG tablet; Take 1 tablet (20 mg total) by mouth daily. -     fenofibrate 160 MG tablet; Take 1 tablet (160 mg total) by mouth daily.  Iron deficiency anemia due to chronic blood  loss -     IBC panel; Future -     CBC w/Diff; Future  Weight loss -     TSH; Future  Vitamin D deficiency -     DG Bone Density; Future -     Vitamin D 1,25 dihydroxy; Future  Hyperglycemia -     Hemoglobin A1c; Future  Asymptomatic postmenopausal estrogen deficiency -     DG Bone Density; Future -     Vitamin D 1,25 dihydroxy; Future  Need for influenza vaccination -     Flu vaccine HIGH DOSE PF  Need for diphtheria-tetanus-pertussis (Tdap) vaccine -     Tdap vaccine greater than or equal to 7yo IM  Chronic bronchitis, unspecified chronic bronchitis type ([place]) -     montelukast (SINGULAIR) 10 MG tablet; Take 1 tablet (10 mg total) by mouth at bedtime.  Gastroesophageal reflux disease, esophagitis presence not specified -     pantoprazole (PROTONIX) 40 MG tablet; Take 1 tablet (40 mg total) by mouth daily. Follow-up appt is due for future refills  Adjustment disorder with mixed anxiety and depressed mood -     PARoxetine (PAXIL) 20 MG tablet; Take 1 tablet (20 mg total) by mouth at bedtime.  Venous stasis dermatitis of both lower extremities -     furosemide (LASIX) 20 MG tablet; Take 1 tablet (20 mg total) by mouth daily.  Other orders -     magnesium oxide (MAG-OX) 400 (241.3 Mg) MG tablet; Take 1 tablet (400 mg total) by mouth daily. Follow-up appt is due for future refills   I am having Ms. Schepers maintain her fluticasone, Calcium, dofetilide, ipratropium-albuterol, Ostomy Supplies, budesonide, arformoterol, ergocalciferol, ferrous sulfate, montelukast, atorvastatin, fenofibrate, furosemide, PARoxetine, pantoprazole, and magnesium oxide.  Meds ordered this encounter  Medications  . montelukast (SINGULAIR) 10 MG tablet    Sig: Take 1 tablet (10 mg total) by mouth at bedtime.    Dispense:  90 tablet    Refill:  1    Order Specific Question:   Supervising Provider    Answer:   Binnie Rail [[id number]]  . atorvastatin (LIPITOR) 20 MG tablet    Sig: Take 1 tablet  (20 mg total) by mouth daily.    Dispense:  90 tablet    Refill:  1    Order Specific Question:   Supervising Provider    Answer:   Binnie Rail [[id number]]  . fenofibrate 160 MG tablet    Sig: Take 1 tablet (160 mg total) by mouth daily.    Dispense:  90 tablet    Refill:  1    Order Specific Question:  Supervising Provider    Answer:   Binnie Rail [[id number]]  . furosemide (LASIX) 20 MG tablet    Sig: Take 1 tablet (20 mg total) by mouth daily.    Dispense:  90 tablet    Refill:  0    Order Specific Question:   Supervising Provider    Answer:   Binnie Rail [[id number]]  . PARoxetine (PAXIL) 20 MG tablet    Sig: Take 1 tablet (20 mg total) by mouth at bedtime.    Dispense:  90 tablet    Refill:  1    Order Specific Question:   Supervising Provider    Answer:   Binnie Rail [[id number]]  . pantoprazole (PROTONIX) 40 MG tablet    Sig: Take 1 tablet (40 mg total) by mouth daily. Follow-up appt is due for future refills    Dispense:  90 tablet    Refill:  1    Order Specific Question:   Supervising Provider    Answer:   Binnie Rail [[id number]]  . magnesium oxide (MAG-OX) 400 (241.3 Mg) MG tablet    Sig: Take 1 tablet (400 mg total) by mouth daily. Follow-up appt is due for future refills    Dispense:  90 tablet    Refill:  0    Order Specific Question:   Supervising Provider    Answer:   Binnie Rail [[id number]]    Follow-up: Return in about 6 months (around 11/25/2017) for with new NP.  Wilfred Lacy, NP

## 2017-05-27 NOTE — Progress Notes (Signed)
Tricia Tricia Hernandez    [id number]    Mar 31, 1931  Primary Care Physician:[name], [name], NP  Referring Physician: Flossie Buffy, NP [place], [place] [address]  Chief complaint:   Follow up of  COPD Lung nodules. 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 She used to follow Tricia Tricia Hernandez, Pulmonologist at [place], [place]. 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 Work up included CT scan of the chest and PET scan which shows non avid stable lung nodules. She was recently evaluated for uncontrolled atrial fibrillation and loaded with Tikosyn. She also had an episode of pneumonia in January 2018 for which she was admitted and treated with steroids and antibiotics.   She has a remote smoking history of about 5 pack years and exposure to secondhand smoke. Quit smoking in 1975. 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 She has been exposed to a lot of dust and fibre inhalation during her work in [employer].   Interim history: Her anticoagulation is being held due to ongoing GI bleed after discussion with Tricia Tricia Hernandez, GI and Dr. Lindi Hernandez, Oncology. She has been evaluated by IR for IVC filter. This has been deferred as she did not have any DVT or evidence of hypercoagulability. Breathing is much improved. CBC shows stable blood counts. She continues on nebulizers with no issues.  Outpatient Encounter Prescriptions as of 05/27/2017  Medication Sig  . arformoterol (BROVANA) 15 MCG/2ML NEBU Take 2 mLs (15 mcg total) by nebulization 2 (two) times daily. Dx copd: J44.9  . atorvastatin (LIPITOR) 20 MG tablet Take  1 tablet (20 mg total) by mouth daily.  . budesonide (PULMICORT) 0.5 MG/2ML nebulizer solution Take 2 mLs (0.5 mg total) by nebulization 2 (two) times daily. Dx code: j1.9  . Calcium 600-200 MG-UNIT tablet Take 1 tablet by mouth daily.  Marland Kitchen dofetilide (TIKOSYN) 500 MCG capsule Take 1 capsule (500 mcg total) by mouth 2 (two) times daily.  . ergocalciferol (VITAMIN D2) 50000 units capsule Take 1 capsule (50,000 Units total) by mouth once a week.  . fenofibrate 160 MG tablet Take 1 tablet (160 mg total) by mouth daily.  . ferrous sulfate 325 (65 FE) MG EC tablet Take 325 mg by mouth 2 (two) times daily.  . fluticasone (FLONASE) 50 MCG/ACT nasal spray Place 1 spray into both nostrils daily.  . furosemide (LASIX) 20 MG tablet Take 1 tablet (20 mg total) by mouth daily.  Marland Kitchen ipratropium-albuterol (DUONEB) 0.5-2.5 (3) MG/3ML SOLN Take 3 mLs by nebulization 2 (two) times daily.  . magnesium oxide (MAG-OX) 400 (241.3 Mg) MG tablet Take 1 tablet (400 mg total) by mouth daily. Follow-up appt is due for future refills  . montelukast (SINGULAIR) 10 MG tablet Take 1 tablet (10 mg total) by mouth at bedtime.  Tricia Tricia Hernandez [id number] is number on bag one piece unit. Colostomy bag and adhesive and alcohol prep, no sting, cannot use regular prep pad.  . pantoprazole (PROTONIX) 40 MG tablet Take 1 tablet (40 mg total) by mouth daily. Follow-up appt is due for future refills  . PARoxetine (  PAXIL) 20 MG tablet Take 1 tablet (20 mg total) by mouth at bedtime.   No facility-administered encounter medications on file as of 05/27/2017.     Allergies as of 05/27/2017 - Review Complete 05/27/2017  Allergen Reaction Noted  . Penicillins Anaphylaxis 06/14/2016  . Aleve [naproxen sodium] Hives 07/22/2016  . Antihistamines, chlorpheniramine-type Rash   . Aspirin Rash 06/14/2016  . Benadryl [diphenhydramine hcl] Palpitations 10/29/2016  . Codeine Rash 06/14/2016  . Hydrochlorothiazide Rash 07/22/2016  . Rocephin  [ceftriaxone sodium in dextrose] Rash 06/14/2016  . Sulfa antibiotics Rash 06/14/2016    Past Medical History:  Diagnosis Date  . Anal squamous cell carcinoma ([place]) 07/2016   "spread to lymph nodes and groins"   . Anemia   . Anxiety   . Atrial fibrillation ([place])   . Basal cell carcinoma of skin of nose   . CHF (congestive heart failure) ([place]) 11/2015  . Chronic bronchitis ([place])   . Chronic depression   . Colostomy in place [facility]) 07/2016  . Diverticular disease   . Fibrocystic disease of breast   . Fracture of shoulder   . GERD (gastroesophageal reflux disease)   . GI bleed 12/10/2016   in setting of no PPI. 5/2 EGD: grade A reflux esophagitis.  Mild, non-hemorrhagic gastritis.  Ablation of 3 Duodenal AVMs  . Herpes zoster   . History of blood transfusion    for vaginal, uterine bleeding leading to hysterectomy.  in 12/2016 transfused x 1 for GI bleed.   Marland Kitchen History of hiatal hernia   . Hyperlipidemia   . Hypertension   . Labyrinthitis   . Lung cancer ([place])    RML  . Obesity   . Paroxysmal supraventricular tachycardia ([place])   . Peptic ulcer of stomach    hx  . Pneumonia 1980s X 1; 08/2016   "walking; double"  . Pulmonary embolism ([place]) 05/2014   "left lung"  . Vitamin D deficiency     Past Surgical History:  Procedure Laterality Date  . APPENDECTOMY  1959  . BASAL CELL CARCINOMA EXCISION     nose  . BREAST CYST ASPIRATION Right 2016?  Marland Kitchen CATARACT EXTRACTION W/ INTRAOCULAR LENS  IMPLANT, BILATERAL Bilateral 1990s  . CHOLECYSTECTOMY OPEN  1980s  . COLOSTOMY N/A 07/29/2016   Procedure: COLOSTOMY;  Surgeon: Clovis Riley, MD;  Location: [place];  Service: General;  Laterality: N/A;  . DILATION AND CURETTAGE OF UTERUS    . ESOPHAGOGASTRODUODENOSCOPY N/A 12/11/2016   Procedure: ESOPHAGOGASTRODUODENOSCOPY (EGD);  Surgeon: Jerene Bears, MD;  Location: [facility];  Service: Endoscopy;  Laterality: N/A;  . EVALUATION UNDER ANESTHESIA WITH HEMORRHOIDECTOMY AND PROCTOSCOPY N/A  07/25/2016   Procedure: EXAM UNDER ANESTHESIA, EXCISION PERIANAL MASS.;  Surgeon: Judeth Horn, MD;  Location: [place];  Service: General;  Laterality: N/A;  Prone position  . IR RADIOLOGIST EVAL & MGMT  03/25/2017  . LAPAROSCOPIC DIVERTED COLOSTOMY N/A 07/29/2016   Procedure: ATTEMPTED LAPAROSCOPIC ASSISTED COLOSTOMY;  Surgeon: Clovis Riley, MD;  Location: [place];  Service: General;  Laterality: N/A;  . LUNG REMOVAL, PARTIAL Right 2013   RML mass/carcinoid, Dr Lurena Nida  . TONSILLECTOMY AND ADENOIDECTOMY  1960s  . VAGINAL HYSTERECTOMY  1959   "partial"    Family History  Problem Relation Age of Onset  . Heart attack Mother        Dec 1987  . CVA Mother   . Hypertension Mother   . Multiple myeloma Mother   . Breast cancer Sister   .  Skin cancer Sister   . Prostate cancer Brother   . Throat cancer Brother   . Cervical cancer Daughter   . Leukemia Daughter   . Leukemia Son   . Throat cancer Brother   . Cancer Brother        soft palette  . Heart Problems Brother        Pacemaker  . Colon cancer Neg Hx   . Pancreatic cancer Neg Hx     Social History   Social History  . Marital status: Widowed    Spouse name: N/A  . Number of children: 5  . Years of education: N/A   Occupational History  . retired    Social History Main Topics  . Smoking status: Former Smoker    Packs/day: 0.50    Years: 15.00    Types: Cigarettes    Quit date: 1976  . Smokeless tobacco: Never Used  . Alcohol use No  . Drug use: No  . Sexual activity: No   Other Topics Concern  . Not on file   Social History Narrative  . No narrative on file   Review of systems: Review of Systems  Constitutional: Negative for fever and chills.  HENT: Negative.   Eyes: Negative for blurred vision.  Respiratory: as per HPI  Cardiovascular: Negative for chest pain and palpitations.  Gastrointestinal: Negative for vomiting, diarrhea, blood per rectum. Genitourinary: Negative for dysuria, urgency, frequency  and hematuria.  Musculoskeletal: Negative for myalgias, back pain and joint pain.  Skin: Negative for itching and rash.  Neurological: Negative for dizziness, tremors, focal weakness, seizures and loss of consciousness.  Endo/Heme/Allergies: Negative for environmental allergies.  Psychiatric/Behavioral: Negative for depression, suicidal ideas and hallucinations.  All other systems reviewed and are negative.  Physical Exam: Blood pressure 138/80, pulse 60, height 5' 4.25" (1.632 m), weight 226 lb 9.6 oz (102.8 kg), SpO2 96 %. Gen:      No acute distress HEENT:  EOMI, sclera anicteric Neck:     No masses; no thyromegaly Lungs:    Clear to auscultation bilaterally; normal respiratory effort CV:         Regular rate and rhythm; no murmurs Abd:      + bowel sounds; soft, non-tender; no palpable masses, no distension Ext:    No edema; adequate peripheral perfusion Skin:      Warm and dry; no rash Neuro: alert and oriented x 3 Psych: normal mood and affect  Data Reviewed: CT chest 07/25/16-postsurgical changes in right middle lung, multiple subcentimeter pulmonary nodules. CTA chest 08/27/16-no pulmonary embolus, stable postsurgical changes, multiple subcentimeter pulmonary nodules. Consolidative changes in bilateral lungs, small bilateral pleural effusions. PET scan 09/16/16- resolution of consolidative changes, stable postsurgical changes, stable subcentimeter pulmonary nodules which are non-PET avid I have reviewed all the images personally  PFTs 02/26/27 FVC 1.43 [58%), FEV1 0.82 (45%), F/F 57, TLC 293%, RV/TLC 188% Severe obstruction with bronchodilator response, hyperinflation  Assessment:  Severe COPD PFTs show severe obstruction with hyperinflation and bronchodilator response (as shown by improvement in FVC]. This is more severe than expected for her smoke exposure. Check A1AT levels and phenotype.  She stable on brovana, Pulmicort nebs.and will continue duonebs as needed.  Oxygen  levels remained good on ambulation. We will stop supplemental oxygen during the daytime. I asked her to continue nighttime oxygen for now.  Pulmonary nodules H/O lung carcinoid s/p resection Lung nodules are stable from December 2017 to February 2018 and are non-PET avid which is reassuring.  These are likely benign processes or possibly carcinoid. We'll continue to observe these on the surveillance CTs that she is due to get for her anal cancer.   Plan/Recommendations: - Continue brovana, pulmocort, duonebs - Follow pulmonary nodules on CT scan - Check A1AT levels and phenotype.   Marshell Garfinkel MD [place] [facility] Pager [phone] 05/27/2017, 11:11 AM  CC: [name], [name], NP

## 2017-05-27 NOTE — Patient Instructions (Addendum)
I'm glad you're feeling better and your breathing is improved Urology levels are good today. He did not require oxygen during the daytime. Continue using the oxygen at night We'll check alpha-1 antitrypsin levels and phenotype. Continue using her nebulizers

## 2017-05-27 NOTE — Patient Instructions (Addendum)
Stable lab results.  Please get shingrix vaccine from pharmacy.  Please schedule Bone density at front desk.

## 2017-05-27 NOTE — Addendum Note (Signed)
Addended by: Lorretta Harp on: 05/27/2017 11:46 AM   Modules accepted: Orders

## 2017-05-28 MED ORDER — ATORVASTATIN CALCIUM 20 MG PO TABS: 20.0000 mg | ORAL_TABLET | Freq: Every day | ORAL | 1 refills | Status: DC

## 2017-05-28 MED ORDER — PAROXETINE HCL 20 MG PO TABS: 20.0000 mg | ORAL_TABLET | Freq: Every day | ORAL | 1 refills | Status: DC

## 2017-05-28 MED ORDER — PANTOPRAZOLE SODIUM 40 MG PO TBEC: 40.0000 mg | DELAYED_RELEASE_TABLET | Freq: Every day | ORAL | 1 refills | Status: DC

## 2017-05-28 MED ORDER — FENOFIBRATE 160 MG PO TABS: 160.0000 mg | ORAL_TABLET | Freq: Every day | ORAL | 1 refills | Status: DC

## 2017-05-28 MED ORDER — MAGNESIUM OXIDE 400 (241.3 MG) MG PO TABS: 400.0000 mg | ORAL_TABLET | Freq: Every day | ORAL | 0 refills | Status: DC

## 2017-05-28 MED ORDER — FUROSEMIDE 20 MG PO TABS: 20.0000 mg | ORAL_TABLET | Freq: Every day | ORAL | 0 refills | Status: DC

## 2017-05-28 MED ORDER — MONTELUKAST SODIUM 10 MG PO TABS: 10.0000 mg | ORAL_TABLET | Freq: Every day | ORAL | 1 refills | Status: DC

## 2017-05-30 LAB — ALPHA-1 ANTITRYPSIN PHENOTYPE: A-1 Antitrypsin, Ser: 141 mg/dL (ref 83–199)

## 2017-05-30 LAB — VITAMIN D 1,25 DIHYDROXY
VITAMIN D2 1, 25 (OH): 15 pg/mL
VITAMIN D3 1, 25 (OH): 14 pg/mL
Vitamin D 1, 25 (OH)2 Total: 29 pg/mL (ref 18–72)

## 2017-06-02 ENCOUNTER — Ambulatory Visit: Payer: Self-pay | Admitting: Surgery

## 2017-06-02 ENCOUNTER — Other Ambulatory Visit: Payer: Self-pay

## 2017-06-02 DIAGNOSIS — C210 Malignant neoplasm of anus, unspecified: Secondary | ICD-10-CM | POA: Diagnosis not present

## 2017-06-02 MED ORDER — FLEET ENEMA 7-19 GM/118ML RE ENEM: 2.0000 | ENEMA | Freq: Once | RECTAL | Status: AC

## 2017-06-02 MED ORDER — DOFETILIDE 500 MCG PO CAPS: 500.0000 ug | ORAL_CAPSULE | Freq: Two times a day (BID) | ORAL | 0 refills | Status: DC

## 2017-06-02 NOTE — H&P (Signed)
History of Present Illness Harrell Gave M. Anzley Dibbern MD; 06/02/2017 9:22 AM) Patient words: Ms. Tricia Hernandez is an 83 year old female referred to me by Dr. Hulen Skains for consideration of flexible sigmoidoscopy.  She has a history of anal canal squamous cell carcinoma which presented with a large bleeding mass and associated fissure and Dr. Hulen Skains to the operating room for EUA and excisional biopsy of July 25, 2016.  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  Her staging workup showed the mass and additionally multiple enlarged bilateral inguinal lymph nodes concerning for metastatic disease.  She has since completed a course of XRT but did not undergo combination therapy with chemotherapy due to her age and frailty.  She is here today to schedule her first surveillance EUA and flexible sigmoidoscopy.  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  She otherwise tolerated the therapy well.  She denies any complaints today.  She reports her weight has been stable.  No further bleeding from her stoma.  She denies taking any blood thinners at this time including aspirin, Plavix.  PMH: HTN, HLD, COPD, GERD, anal canal scc  FHx: Denies FHx of malignancy  Social hx : Denies tobacco/EtOH/drug use  A comprehensive 10 system review of systems was performed and negative except as listed above.  The patient is a 83 year old female.   Allergies Malachy Moan, RMA; 06/02/2017 9:05 AM) HYDROCHLOROTHIAZIDE   Rash. Aleve *ANALGESICS - ANTI-INFLAMMATORY*   Hives. Antihistamines, Chlorpheniramine-type   Aspirin *ANALGESICS - NonNarcotic*   Ciprofloxacin *FLUOROQUINOLONES*   Codeine Sulfate *ANALGESICS - OPIOID*   Penicillin G Sodium *PENICILLINS*   Rocephin *CEPHALOSPORINS*   Sulfa  Antibiotics    Medication History Malachy Moan, RMA; 06/02/2017 9:08 AM) Furosemide  (20MG Tablet, Oral) Active. Pantoprazole Sodium  (40MG Tablet DR, Oral) Active. Brovana  (15MCG/2ML Nebulized Soln, Inhalation) Active. Budesonide  (0.5MG/2ML Suspension, Inhalation) Active. Vitamin D (Ergocalciferol)  (50000UNIT Capsule, Oral) Active. Dofetilide  (500MCG Capsule, Oral) Active. Ensure Plus  (Oral) Active. Magnesium Oxide  (Oral) Specific strength unknown - Active. Calcium Carb-Cholecalciferol  (600-400MG-UNIT Tablet, Oral) Active. Atorvastatin Calcium  (20MG Tablet, Oral) Active. Fenofibrate  (160MG Tablet, Oral) Active. Ferrous Sulfate  (325 (65 Fe)MG Tablet, Oral) Active. Fluticasone Propionate  (50MCG/ACT Suspension, Nasal) Active. Ipratropium-Albuterol  (0.5-2.5 (3)MG/3ML Solution, Inhalation) Active. Montelukast Sodium  (10MG Tablet, Oral) Active. PARoxetine HCl  (20MG Tablet, Oral) Active. Medications Reconciled   Vitals Malachy Moan RMA; 06/02/2017 9:09 AM) 06/02/2017 9:08 AM Weight: 227.6 lb   Height: 64 in  Body Surface Area: 2.07 m   Body Mass Index: 39.07 kg/m   Temp.: 98.8 F    Pulse: 61 (Regular)    BP: 140/80 (Sitting, Left Arm, Standard)       Physical Exam Harrell Gave M. Dayane Hillenburg MD; 06/02/2017 9:25 AM) The physical exam findings are as follows: Note: Constitutional: No acute distress, conversant, no deformities Eyes: Moist conjunctiva, no lid lag, anicteric, pupils equal round reactive to light Neck: Trachea midline: No thyromegaly Lungs: Normal respiratory effort; no tactile fremitus CV: Irregular rate, irregular rhythm; no palpable thrills, no pitting edema GI: Abdomen is soft, nontender; stoma is pink and productive of stool; no palpable hepatosplenomegaly; tender right inguinal region with possible palpable inguinal node MSK: Normal gait; no clubbing/cyanosis Psych: Appropriate  affect; alert and oriented 3 Lymph: No palpable cervical or  axillary lymphadenopathy; palpable right groin lymphadenopathy consistent with known disease on CT-PET    Assessment & Plan Harrell Gave M. Velinda Wrobel MD; 06/02/2017 9:28 AM) ANAL CANCER (C21.0) Impression: Status post XRT -Known R groin lymphadenopathy undergoing surveillance by Dr. Hulen Skains and her oncology team -Here today to schedule flex sig of her rectum to evaluate for possible radiation proctitis as a source of her anemia and surveillance of her anal canal -Procedure, material risks (including, but not limited to, pain, bleeding, perforation of colon), benefits, and alternatives were described. Her questions and her daughter's questions are answered to her satisfaction and the patient elected proceed. -Schedule for anal EUA + flexible sigmoidoscopy to be performed under MAC - will need 2 fleets enemas the morning of the procedure. Has had prior colonoscopy in recent past per daugter in the last year and was otherwise normal so will defer evaluation through stoma at this time.

## 2017-06-11 ENCOUNTER — Encounter (HOSPITAL_COMMUNITY): Payer: Self-pay | Admitting: *Deleted

## 2017-06-16 ENCOUNTER — Ambulatory Visit (HOSPITAL_COMMUNITY): Payer: Medicare Other | Admitting: Certified Registered Nurse Anesthetist

## 2017-06-16 ENCOUNTER — Encounter (HOSPITAL_COMMUNITY)
Admission: RE | Disposition: A | Discharge status: Home / Self Care | Payer: Self-pay | Source: Ambulatory Visit | Attending: Surgery

## 2017-06-16 ENCOUNTER — Encounter (HOSPITAL_COMMUNITY): Payer: Self-pay

## 2017-06-16 ENCOUNTER — Ambulatory Visit (HOSPITAL_COMMUNITY)
Admission: RE | Admit: 2017-06-16 | Discharge: 2017-06-16 | Disposition: A | Discharge status: Home / Self Care | Payer: Medicare Other | Source: Ambulatory Visit | Attending: Surgery | Admitting: Surgery

## 2017-06-16 DIAGNOSIS — Z86711 Personal history of pulmonary embolism: Secondary | ICD-10-CM | POA: Diagnosis not present

## 2017-06-16 DIAGNOSIS — Z880 Allergy status to penicillin: Secondary | ICD-10-CM | POA: Insufficient documentation

## 2017-06-16 DIAGNOSIS — Z888 Allergy status to other drugs, medicaments and biological substances status: Secondary | ICD-10-CM | POA: Insufficient documentation

## 2017-06-16 DIAGNOSIS — J449 Chronic obstructive pulmonary disease, unspecified: Secondary | ICD-10-CM | POA: Insufficient documentation

## 2017-06-16 DIAGNOSIS — K625 Hemorrhage of anus and rectum: Secondary | ICD-10-CM | POA: Insufficient documentation

## 2017-06-16 DIAGNOSIS — Z881 Allergy status to other antibiotic agents status: Secondary | ICD-10-CM | POA: Insufficient documentation

## 2017-06-16 DIAGNOSIS — Z885 Allergy status to narcotic agent status: Secondary | ICD-10-CM | POA: Insufficient documentation

## 2017-06-16 DIAGNOSIS — K922 Gastrointestinal hemorrhage, unspecified: Secondary | ICD-10-CM | POA: Diagnosis not present

## 2017-06-16 DIAGNOSIS — Z79899 Other long term (current) drug therapy: Secondary | ICD-10-CM | POA: Diagnosis not present

## 2017-06-16 DIAGNOSIS — K6289 Other specified diseases of anus and rectum: Secondary | ICD-10-CM | POA: Insufficient documentation

## 2017-06-16 DIAGNOSIS — Z85048 Personal history of other malignant neoplasm of rectum, rectosigmoid junction, and anus: Secondary | ICD-10-CM | POA: Diagnosis not present

## 2017-06-16 DIAGNOSIS — Z87891 Personal history of nicotine dependence: Secondary | ICD-10-CM | POA: Insufficient documentation

## 2017-06-16 DIAGNOSIS — Z923 Personal history of irradiation: Secondary | ICD-10-CM | POA: Diagnosis not present

## 2017-06-16 DIAGNOSIS — E785 Hyperlipidemia, unspecified: Secondary | ICD-10-CM | POA: Diagnosis not present

## 2017-06-16 DIAGNOSIS — Z86718 Personal history of other venous thrombosis and embolism: Secondary | ICD-10-CM | POA: Insufficient documentation

## 2017-06-16 DIAGNOSIS — Z8711 Personal history of peptic ulcer disease: Secondary | ICD-10-CM | POA: Insufficient documentation

## 2017-06-16 DIAGNOSIS — I509 Heart failure, unspecified: Secondary | ICD-10-CM | POA: Diagnosis not present

## 2017-06-16 DIAGNOSIS — Z886 Allergy status to analgesic agent status: Secondary | ICD-10-CM | POA: Insufficient documentation

## 2017-06-16 DIAGNOSIS — Z882 Allergy status to sulfonamides status: Secondary | ICD-10-CM | POA: Insufficient documentation

## 2017-06-16 DIAGNOSIS — R590 Localized enlarged lymph nodes: Secondary | ICD-10-CM | POA: Diagnosis not present

## 2017-06-16 DIAGNOSIS — K219 Gastro-esophageal reflux disease without esophagitis: Secondary | ICD-10-CM | POA: Insufficient documentation

## 2017-06-16 DIAGNOSIS — Z7951 Long term (current) use of inhaled steroids: Secondary | ICD-10-CM | POA: Diagnosis not present

## 2017-06-16 DIAGNOSIS — I110 Hypertensive heart disease with heart failure: Secondary | ICD-10-CM | POA: Insufficient documentation

## 2017-06-16 DIAGNOSIS — Z85118 Personal history of other malignant neoplasm of bronchus and lung: Secondary | ICD-10-CM | POA: Insufficient documentation

## 2017-06-16 DIAGNOSIS — D649 Anemia, unspecified: Secondary | ICD-10-CM | POA: Diagnosis not present

## 2017-06-16 DIAGNOSIS — I4891 Unspecified atrial fibrillation: Secondary | ICD-10-CM | POA: Diagnosis not present

## 2017-06-16 HISTORY — PX: FLEXIBLE SIGMOIDOSCOPY: SHX5431

## 2017-06-16 SURGERY — SIGMOIDOSCOPY, FLEXIBLE
Anesthesia: Monitor Anesthesia Care

## 2017-06-16 MED ORDER — DEXAMETHASONE SODIUM PHOSPHATE 10 MG/ML IJ SOLN
INTRAMUSCULAR | Status: AC
  Filled 2017-06-16: qty 1

## 2017-06-16 MED ORDER — PROPOFOL 10 MG/ML IV BOLUS
INTRAVENOUS | Status: AC
  Filled 2017-06-16: qty 20

## 2017-06-16 MED ORDER — ONDANSETRON HCL 4 MG/2ML IJ SOLN
INTRAMUSCULAR | Status: AC
Start: 2017-06-16 — End: 2017-06-16
  Filled 2017-06-16: qty 2

## 2017-06-16 MED ORDER — LIDOCAINE 2% (20 MG/ML) 5 ML SYRINGE
INTRAMUSCULAR | Status: DC | PRN
  Administered 2017-06-16: 50 mg via INTRAVENOUS

## 2017-06-16 MED ORDER — PROPOFOL 10 MG/ML IV BOLUS
INTRAVENOUS | Status: DC | PRN
  Administered 2017-06-16: 30 mg via INTRAVENOUS
  Administered 2017-06-16 (×5): 20 mg via INTRAVENOUS

## 2017-06-16 MED ORDER — PROPOFOL 10 MG/ML IV BOLUS
INTRAVENOUS | Status: AC
  Filled 2017-06-16: qty 40

## 2017-06-16 MED ORDER — ONDANSETRON HCL 4 MG/2ML IJ SOLN
INTRAMUSCULAR | Status: DC | PRN
  Administered 2017-06-16: 4 mg via INTRAVENOUS

## 2017-06-16 MED ORDER — DEXAMETHASONE SODIUM PHOSPHATE 10 MG/ML IJ SOLN
INTRAMUSCULAR | Status: DC | PRN
  Administered 2017-06-16: 10 mg via INTRAVENOUS

## 2017-06-16 MED ORDER — SODIUM CHLORIDE 0.9 % IV SOLN: INTRAVENOUS | Status: DC

## 2017-06-16 MED ORDER — LIDOCAINE 2% (20 MG/ML) 5 ML SYRINGE
INTRAMUSCULAR | Status: AC
  Filled 2017-06-16: qty 5

## 2017-06-16 MED ORDER — LACTATED RINGERS IV SOLN
INTRAVENOUS | Status: DC | PRN
  Administered 2017-06-16: 10:00:00 via INTRAVENOUS

## 2017-06-16 MED ORDER — PROPOFOL 500 MG/50ML IV EMUL
INTRAVENOUS | Status: DC | PRN
  Administered 2017-06-16: 100 ug/kg/min via INTRAVENOUS

## 2017-06-16 NOTE — Anesthesia Preprocedure Evaluation (Signed)
Anesthesia Evaluation  Patient identified by MRN, date of birth, ID band Patient awake    Reviewed: Allergy & Precautions, H&P , NPO status , Patient's Chart, lab work & pertinent test results, reviewed documented beta blocker date and time   History of Anesthesia Complications Negative for: history of anesthetic complications  Airway Mallampati: II  TM Distance: >3 FB Neck ROM: Full    Dental no notable dental hx. (+) Upper Dentures, Partial Lower   Pulmonary shortness of breath, COPD,  COPD inhaler, neg recent URI, former smoker,  h/o lung ca,   Shallow respirations.  Pulmonary exam normal breath sounds clear to auscultation       Cardiovascular hypertension, Pt. on medications +CHF   Rhythm:Irregular Rate:Normal     Neuro/Psych PSYCHIATRIC DISORDERS Anxiety Depression negative neurological ROS     GI/Hepatic Neg liver ROS, hiatal hernia, PUD, GERD  Medicated and Controlled,  Endo/Other  negative endocrine ROS  Renal/GU negative Renal ROS     Musculoskeletal   Abdominal   Peds  Hematology  (+) anemia ,   Anesthesia Other Findings   Reproductive/Obstetrics                             Anesthesia Physical  Anesthesia Plan  ASA: III  Anesthesia Plan: MAC   Post-op Pain Management:    Induction: Intravenous  PONV Risk Score and Plan: 2 and Treatment may vary due to age or medical condition and Propofol infusion  Airway Management Planned: Nasal Cannula, Natural Airway and Simple Face Mask  Additional Equipment: None  Intra-op Plan:   Post-operative Plan:   Informed Consent: I have reviewed the patients History and Physical, chart, labs and discussed the procedure including the risks, benefits and alternatives for the proposed anesthesia with the patient or authorized representative who has indicated his/her understanding and acceptance.   Dental advisory given  Plan  Discussed with: CRNA and Surgeon  Anesthesia Plan Comments:         Anesthesia Quick Evaluation

## 2017-06-16 NOTE — Anesthesia Procedure Notes (Signed)
Date/Time: 06/16/2017 10:04 AM Performed by: Claudia Desanctis, CRNA Pre-anesthesia Checklist: Patient identified Oxygen Delivery Method: Simple face mask

## 2017-06-16 NOTE — Transfer of Care (Signed)
Immediate Anesthesia Transfer of Care Note  Patient: Tricia Hernandez  Procedure(s) Performed: FLEXIBLE SIGMOIDOSCOPY EXAM UNDER ANESHESIA (N/A )  Patient Location: PACU and Endoscopy Unit  Anesthesia Type:MAC  Level of Consciousness: awake, alert , oriented and patient cooperative  Airway & Oxygen Therapy: Patient Spontanous Breathing and Patient connected to face mask  Post-op Assessment: Report given to RN and Post -op Vital signs reviewed and stable  Post vital signs: Reviewed and stable  Last Vitals:  Vitals:   06/16/17 0943  BP: (!) 138/34  Pulse: 66  Resp: 12  Temp: 36.7 C  SpO2: 98%    Last Pain:  Vitals:   06/16/17 0943  TempSrc: Oral         Complications: No apparent anesthesia complications

## 2017-06-16 NOTE — Op Note (Signed)
[facility] Patient Name: Tricia Hernandez Procedure Date: 06/16/2017 MRN: [id number] Attending MD: Ileana Roup MD, MD Date of Birth: 1930-09-03 CSN: [id number] Age: 83 Admit Type: Outpatient Procedure:                Flexible Sigmoidoscopy Indications:              High risk colon cancer surveillance: Personal                            history of anal cancer, Rectal hemorrhage Providers:                Sharon Mt. Jawon Dipiero MD, MD, Carolynn Comment RN,                            RN, Corliss Parish, Technician Referring MD:              Medicines:                Monitored Anesthesia Care Complications:            No immediate complications. Estimated Blood Loss:     Estimated blood loss was minimal. Procedure:                Pre-Anesthesia Assessment:                           - ASA Grade Assessment: IV - A patient with severe                            systemic disease that is a constant threat to life.                           - The anesthesia plan was to use monitored                            anesthesia care (MAC).                           After obtaining informed consent, the scope was                            passed under direct vision. The EC-[id number] ([id number])                            scope was introduced through the anus and advanced                            to the the sigmoid colon. The flexible                            sigmoidoscopy was accomplished without difficulty.                            The patient tolerated the procedure well. The  quality of the bowel preparation was adequate. Scope In: Scope Out: Findings:      Low grade ring like stricture of distal anal canal just beneath dentate       - traversable with finger.      Anoscopy: No abnomal masses or ulcerations. Fibrotic scar biopsied at       anterior and posterior regions. Estimated blood loss was minimal.       Biopsies were taken with a cold  forceps for histology. Estimated blood       loss was minimal.      The mucosa vascular pattern in the sigmoid colon of telangectiasias was       found consistent with radiation changes to the sigmoid colon. Estimated       blood loss was minimal.      A diffuse area of moderately erythematous mucosa with telangectiasias       was found in the rectum and sigmoid colon consistent with radiation       proctopathy      A rigid proctoscope was introducted into the rectum for application of       4% Formalin using a cotton tipped applicator. This was applied to each       quadrant along the length of the rectum with ~4 minutes of 'dwell' time.       Minimal blood loss from this maneuver, Estimated blood loss was minimal. Impression:               - Increased mucosa vascular pattern in the sigmoid                            colon with telangectiasias consistent with                            radiation changes.                           - Erythematous mucosa in the rectum with                            telangectiasias consistent with radiation changes Moderate Sedation:      N/A- Per Anesthesia Care Recommendation:           - Await pathology results.                           - Repeat flexible sigmoidoscopy at appointment to                            be scheduled for surveillance.                           - Resume previous diet. Procedure Code(s):        --- Professional ---                           [phone], Sigmoidoscopy, flexible; with biopsy, single                            or multiple Diagnosis Code(s):        --- Professional ---  Z85.048, Personal history of other malignant                            neoplasm of rectum, rectosigmoid junction, and anus                           K62.89, Other specified diseases of anus and rectum                           K62.5, Hemorrhage of anus and rectum CPT copyright 2016 [employer]. All rights  reserved. The codes documented in this report are preliminary and upon coder review may  be revised to meet current compliance requirements. Nadeen Landau, MD Ileana Roup MD, MD 06/16/2017 10:53:33 AM This report has been signed electronically. Number of Addenda: 0

## 2017-06-16 NOTE — Discharge Instructions (Signed)

## 2017-06-16 NOTE — H&P (Signed)
History of Present Illness Patient words: Tricia Hernandez is an 83 year old female referred to me by Dr. Hulen Skains for consideration of flexible sigmoidoscopy.  She has a history of anal canal squamous cell carcinoma which presented with a large bleeding mass and associated fissure and Dr. Hulen Skains to the operating room for EUA and excisional biopsy of July 25, 2016.  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  Her staging workup showed the mass and additionally multiple enlarged bilateral inguinal lymph nodes concerning for metastatic disease.  She has since completed a course of XRT but did not undergo combination therapy with chemotherapy due to her age and frailty.  She is here today to schedule her first surveillance EUA and flexible sigmoidoscopy.  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  She otherwise tolerated the therapy well.  She denies any complaints today.  She reports her weight has been stable.  No further bleeding from her stoma.  She denies taking any blood thinners at this time including aspirin, Plavix.  PMH: HTN, HLD, COPD, GERD, anal canal scc FHx: Denies FHx of malignancy Social hx : Denies tobacco/EtOH/drug use A comprehensive 10 system review of systems was performed and negative except as listed above.   Allergies HYDROCHLOROTHIAZIDE   Rash. Aleve *ANALGESICS - ANTI-INFLAMMATORY*   Hives. Antihistamines, Chlorpheniramine-type   Aspirin *ANALGESICS - NonNarcotic*   Ciprofloxacin *FLUOROQUINOLONES*   Codeine Sulfate *ANALGESICS - OPIOID*   Penicillin G Sodium *PENICILLINS*   Rocephin *CEPHALOSPORINS*   Sulfa Antibiotics    Medication History Furosemide  (20MG Tablet, Oral) Active. Pantoprazole Sodium  (40MG Tablet DR, Oral) Active. Brovana   (15MCG/2ML Nebulized Soln, Inhalation) Active. Budesonide  (0.5MG/2ML Suspension, Inhalation) Active. Vitamin D (Ergocalciferol)  (50000UNIT Capsule, Oral) Active. Dofetilide  (500MCG Capsule, Oral) Active. Ensure Plus  (Oral) Active. Magnesium Oxide  (Oral) Specific strength unknown - Active. Calcium Carb-Cholecalciferol  (600-400MG-UNIT Tablet, Oral) Active. Atorvastatin Calcium  (20MG Tablet, Oral) Active. Fenofibrate  (160MG Tablet, Oral) Active. Ferrous Sulfate  (325 (65 Fe)MG Tablet, Oral) Active. Fluticasone Propionate  (50MCG/ACT Suspension, Nasal) Active. Ipratropium-Albuterol  (0.5-2.5 (3)MG/3ML Solution, Inhalation) Active. Montelukast Sodium  (10MG Tablet, Oral) Active. PARoxetine HCl  (20MG Tablet, Oral) Active. Medications Reconciled   Vitals 06/02/2017 9:08 AM Weight: 227.6 lb   Height: 64 in  Body Surface Area: 2.07 m   Body Mass Index: 39.07 kg/m   Temp.: 98.8 F    Pulse: 61 (Regular)    BP: 140/80 (Sitting, Left Arm, Standard)       Physical Exam Note: Constitutional: No acute distress, conversant, no deformities Eyes: Moist conjunctiva, no lid lag, anicteric, pupils equal round reactive to light Neck: Trachea midline: No thyromegaly Lungs: Normal respiratory effort; no tactile fremitus CV: Irregular rate, irregular rhythm; no palpable thrills, no pitting edema GI: Abdomen is soft, nontender; stoma is pink and productive of stool; no palpable hepatosplenomegaly; tender right inguinal region with possible palpable inguinal node MSK: Normal gait; no clubbing/cyanosis Psych: Appropriate affect; alert and oriented 3 Lymph: No palpable cervical or axillary lymphadenopathy; palpable right groin lymphadenopathy consistent with known disease on CT-PET  A/P ANAL CANCER (C21.0) Impression: Status post XRT -Known R groin lymphadenopathy undergoing surveillance by Dr. Hulen Skains and her oncology team -Here today to schedule flex sig of  her rectum to evaluate for  possible radiation proctitis as a source of her anemia and surveillance of her anal canal -Procedure, material risks (including, but not limited to, pain, bleeding, infection, perforation of colon), benefits, and alternatives were described. Her questions and her daughter's questions are answered to her satisfaction and the patient elected proceed. -Proceed with anal EUA + flexible sigmoidoscopy to be performed under MAC - will need 2 fleets enemas the morning of the procedure. Has had prior colonoscopy in recent past per daugter in the last year and was otherwise normal so will defer evaluation through stoma at this time

## 2017-06-16 NOTE — Anesthesia Postprocedure Evaluation (Signed)
Anesthesia Post Note  Patient: Tricia Hernandez  Procedure(s) Performed: FLEXIBLE SIGMOIDOSCOPY EXAM UNDER ANESHESIA (N/A )     Patient location during evaluation: PACU Anesthesia Type: MAC Level of consciousness: awake and alert Pain management: pain level controlled Vital Signs Assessment: post-procedure vital signs reviewed and stable Respiratory status: spontaneous breathing, nonlabored ventilation, respiratory function stable and patient connected to nasal cannula oxygen Cardiovascular status: stable and blood pressure returned to baseline Postop Assessment: no apparent nausea or vomiting Anesthetic complications: no    Last Vitals:  Vitals:   06/16/17 1100 06/16/17 1110  BP: (!) 94/55 119/68  Pulse: 61 (!) 58  Resp: 14 17  Temp:    SpO2: 98% 94%    Last Pain:  Vitals:   06/16/17 1040  TempSrc: Oral                 Kerrin Markman EDWARD

## 2017-06-18 ENCOUNTER — Encounter (HOSPITAL_COMMUNITY): Payer: Self-pay | Admitting: Surgery

## 2017-06-24 ENCOUNTER — Other Ambulatory Visit: Payer: Medicare Other

## 2017-06-24 ENCOUNTER — Ambulatory Visit: Payer: Medicare Other | Admitting: Hematology and Oncology

## 2017-07-02 DIAGNOSIS — H353111 Nonexudative age-related macular degeneration, right eye, early dry stage: Secondary | ICD-10-CM | POA: Diagnosis not present

## 2017-08-07 ENCOUNTER — Telehealth: Payer: Self-pay | Admitting: Nurse Practitioner

## 2017-08-07 NOTE — Telephone Encounter (Signed)
Copied from [place] [id number]. Topic: Quick Communication - See Telephone Encounter >> Aug 07, 2017  4:19 PM Burnis Medin, NT wrote: CRM for notification. See Telephone encounter for: Pt called in and said that she is not able to go to the road to check her mail and was wanting to see if the doctor can write a note saying she is not able to go to the road and if a mail box could be put on the door. Pt said the Postmaster said she wanted a noted from the doctor. Postmaster name is Titus Dubin. Address is [address] [place] [address] [phone] Pt would like a call back or call her daughter Cammy Copa [phone]  08/07/17.

## 2017-08-07 NOTE — Telephone Encounter (Signed)
Should this go to [place] to [employer]? Please advise

## 2017-08-07 NOTE — Telephone Encounter (Signed)
Please advise 

## 2017-08-07 NOTE — Telephone Encounter (Signed)
Since Hollie Beach has never seen patient it would be better to see if Baldo Ash would be willing to write the note. If not patient may need an OV.

## 2017-08-08 DIAGNOSIS — J3489 Other specified disorders of nose and nasal sinuses: Secondary | ICD-10-CM | POA: Diagnosis not present

## 2017-08-08 DIAGNOSIS — J329 Chronic sinusitis, unspecified: Secondary | ICD-10-CM | POA: Diagnosis not present

## 2017-08-08 NOTE — Telephone Encounter (Signed)
Pt is aware to call and ask for the form.

## 2017-08-08 NOTE — Telephone Encounter (Signed)
I will glad to provide a medical note for the post office, but this is typically done through a form from [place]. Please have patient call their administartive office for this form. Thank you

## 2017-08-26 DIAGNOSIS — R42 Dizziness and giddiness: Secondary | ICD-10-CM | POA: Diagnosis not present

## 2017-08-26 DIAGNOSIS — H8309 Labyrinthitis, unspecified ear: Secondary | ICD-10-CM | POA: Diagnosis not present

## 2017-08-26 DIAGNOSIS — R5383 Other fatigue: Secondary | ICD-10-CM | POA: Diagnosis not present

## 2017-09-11 DIAGNOSIS — H8110 Benign paroxysmal vertigo, unspecified ear: Secondary | ICD-10-CM | POA: Diagnosis not present

## 2017-09-11 DIAGNOSIS — R001 Bradycardia, unspecified: Secondary | ICD-10-CM | POA: Diagnosis not present

## 2017-09-11 DIAGNOSIS — R42 Dizziness and giddiness: Secondary | ICD-10-CM | POA: Diagnosis not present

## 2017-09-11 DIAGNOSIS — H903 Sensorineural hearing loss, bilateral: Secondary | ICD-10-CM | POA: Diagnosis not present

## 2017-09-12 ENCOUNTER — Telehealth: Payer: Self-pay | Admitting: Internal Medicine

## 2017-09-12 NOTE — Telephone Encounter (Signed)
New message    [facility] calling  [phone] to report dizziness, HR 50. Please call Elise Benne NP.    STAT if HR is under 50 or over 120 (normal HR is 60-100 beats per minute)  1) What is your heart rate? 50  2) Do you have a log of your heart rate readings (document readings)? n/a  3) Do you have any other symptoms? dizziness

## 2017-09-12 NOTE — Telephone Encounter (Signed)
Call back received from NP.  Discussed Pt situation.  NP to fax her note to this office today.  Will review.  Pt has been on Paxil with Tikosyn when seen last in this office.  Await records.  Will check in with Pt.

## 2017-09-12 NOTE — Telephone Encounter (Signed)
Call returned to [facility], left message for Elise Benne, NP. Notified that Pt HR is normally in the 50's.  Notified that Pt has history of dizziness with low hemoglobin.  If Pt presyncopal she should get an EKG asap to assess QTC.  Left this nurse name and # for call back if I can be of any more assistance.

## 2017-09-15 NOTE — Telephone Encounter (Signed)
Spoke with daughter per DPR.  Per daughter Pt developed some dizziness s/p URI.  Per daughter, Pt is not presyncopal or syncopal.  Daughter states Pt HR is normally in the 50's, Pt keeps a daily log.  Daughter not concerned about HR.  Did discuss Pt due for f/u appt, notified she would get call from scheduling to set up appt.  Daughter thanked Marine scientist for call.  Pt information given to scheduling.

## 2017-09-16 ENCOUNTER — Telehealth: Payer: Self-pay | Admitting: Internal Medicine

## 2017-09-16 NOTE — Telephone Encounter (Signed)
Patient son Sheffield Slider) calling,   Carloyn Manner would like to discuss previous call from 09-15-17. Carloyn Manner will be unavailable for about 15 mins as he will be in [employer] with unstable signal

## 2017-09-16 NOTE — Telephone Encounter (Signed)
Spoke with son.  Notified son Pt HR normally runs between 25 and 70 per review of her records over the last year.  Son should notify office if Pt has presycopal or syncopal events because this could be d/t her medication (Tikosyn).  OK to treat Pt vertigo r/t recent URI.  Gave fax # if ENT office wants cardiac clearance.  Son thanked nurse for call.  No further action needed at this time.  Gave Pt info to scheduling for f/u appt per last visit with Tommye Standard PA.

## 2017-09-22 ENCOUNTER — Ambulatory Visit: Payer: Medicare Other | Admitting: Nurse Practitioner

## 2017-10-10 ENCOUNTER — Telehealth: Payer: Self-pay | Admitting: Hematology and Oncology

## 2017-10-10 NOTE — Telephone Encounter (Signed)
patient called to reschedule

## 2017-10-12 ENCOUNTER — Other Ambulatory Visit: Payer: Self-pay | Admitting: Nurse Practitioner

## 2017-10-12 DIAGNOSIS — I872 Venous insufficiency (chronic) (peripheral): Secondary | ICD-10-CM

## 2017-10-13 DIAGNOSIS — H903 Sensorineural hearing loss, bilateral: Secondary | ICD-10-CM | POA: Diagnosis not present

## 2017-10-13 DIAGNOSIS — J3089 Other allergic rhinitis: Secondary | ICD-10-CM | POA: Diagnosis not present

## 2017-10-13 DIAGNOSIS — H8303 Labyrinthitis, bilateral: Secondary | ICD-10-CM | POA: Diagnosis not present

## 2017-10-19 ENCOUNTER — Other Ambulatory Visit: Payer: Self-pay | Admitting: Nurse Practitioner

## 2017-10-23 ENCOUNTER — Telehealth: Payer: Self-pay | Admitting: Nurse Practitioner

## 2017-10-23 NOTE — Telephone Encounter (Signed)
Copied from [place] [id number]. Topic: Quick Communication - See Telephone Encounter >> Oct 23, 2017  2:38 PM Cleaster Corin, NT wrote: CRM for notification. See Telephone encounter for:   10/23/17. Pt. Needing a letter stating that she can not walk to her mailbox with her health conditions. Pt. Cb number [phone]

## 2017-11-03 ENCOUNTER — Telehealth: Payer: Self-pay | Admitting: Internal Medicine

## 2017-11-03 DIAGNOSIS — J3089 Other allergic rhinitis: Secondary | ICD-10-CM | POA: Diagnosis not present

## 2017-11-03 DIAGNOSIS — G450 Vertebro-basilar artery syndrome: Secondary | ICD-10-CM | POA: Diagnosis not present

## 2017-11-03 NOTE — Telephone Encounter (Signed)
New message    Pt c/o medication issue:  1. Name of Medication: pentoxifylline ER, prescribed by ENT  2. How are you currently taking this medication (dosage and times per day)? 400MG  twice a day  3. Are you having a reaction (difficulty breathing--STAT)? No 4. What is your medication issue? Daughter wants to confirm if medication is safe take with heart meds

## 2017-11-06 NOTE — Telephone Encounter (Signed)
Call returned to Pt. Notified per Dr. Lovena Le ok to take medication with heart medications.  Pt contact indicates understanding.

## 2017-11-18 ENCOUNTER — Encounter: Payer: Self-pay | Admitting: Internal Medicine

## 2017-11-21 ENCOUNTER — Ambulatory Visit (INDEPENDENT_AMBULATORY_CARE_PROVIDER_SITE_OTHER): Payer: Medicare Other | Admitting: Internal Medicine

## 2017-11-21 ENCOUNTER — Encounter: Payer: Self-pay | Admitting: Internal Medicine

## 2017-11-21 VITALS — BP 128/62 | HR 50 | Ht 64.0 in | Wt 228.0 lb

## 2017-11-21 DIAGNOSIS — Z79899 Other long term (current) drug therapy: Secondary | ICD-10-CM | POA: Diagnosis not present

## 2017-11-21 DIAGNOSIS — I480 Paroxysmal atrial fibrillation: Secondary | ICD-10-CM | POA: Diagnosis not present

## 2017-11-21 DIAGNOSIS — I10 Essential (primary) hypertension: Secondary | ICD-10-CM

## 2017-11-21 NOTE — Patient Instructions (Signed)

## 2017-11-21 NOTE — Progress Notes (Addendum)
HPI Tricia Hernandez returns today for ongoing evaluation and management of atrial fib and bradycardia in the setting of diastolic heart failure. She has been losing weight by eating less. She had a bad viral infection 3 months ago and feels better but still some morning dizziness. No syncope. No palpitations. Allergies  Allergen Reactions  . Penicillins     Reaction:  Unknown  Has patient had a PCN reaction causing immediate rash, facial/tongue/throat swelling, SOB or lightheadedness with hypotension: Unsure Has patient had a PCN reaction causing severe rash involving mucus membranes or skin necrosis: Unsure Has patient had a PCN reaction that required hospitalization Unsure Has patient had a PCN reaction occurring within the last 10 years: Unsure If all of the above answers are "NO", then may proceed with Cephalosporin use.   [name] [Naproxen Sodium] Hives  . Antihistamines, Chlorpheniramine-Type Rash    Reaction:  Unknown   . Aspirin Rash  . Benadryl [Diphenhydramine Hcl] Palpitations    Increases heart rate  . Codeine Rash  . Hydrochlorothiazide Rash  . Rocephin [Ceftriaxone Sodium In Dextrose] Rash  . Sulfa Antibiotics Rash     Current Outpatient Medications  Medication Sig Dispense Refill  . acetaminophen (TYLENOL) 500 MG tablet Take 500 mg by mouth daily as needed for moderate pain.    Marland Kitchen arformoterol (BROVANA) 15 MCG/2ML NEBU Take 2 mLs (15 mcg total) by nebulization 2 (two) times daily. Dx copd: J44.9 120 mL 5  . atorvastatin (LIPITOR) 20 MG tablet Take 1 tablet (20 mg total) by mouth daily. (Patient taking differently: Take 20 mg by mouth at bedtime. ) 90 tablet 1  . Biotin 10000 MCG TABS Take 10,000 mcg by mouth 2 (two) times daily.    . budesonide (PULMICORT) 0.5 MG/2ML nebulizer solution Take 2 mLs (0.5 mg total) by nebulization 2 (two) times daily. Dx code: j44.9 120 mL 5  . Calcium 600-200 MG-UNIT tablet Take 1 tablet by mouth daily.    Marland Kitchen dofetilide (TIKOSYN)  500 MCG capsule Take 1 capsule (500 mcg total) by mouth 2 (two) times daily. 180 capsule 0  . ergocalciferol (VITAMIN D2) 50000 units capsule Take 1 capsule (50,000 Units total) by mouth once a week. 12 capsule 3  . fenofibrate 160 MG tablet Take 1 tablet (160 mg total) by mouth daily. 90 tablet 1  . ferrous sulfate 325 (65 FE) MG EC tablet Take 325 mg by mouth 2 (two) times daily.    . fluticasone (FLONASE) 50 MCG/ACT nasal spray Place 1 spray into both nostrils daily.    . furosemide (LASIX) 20 MG tablet Take 1 tablet (20 mg total) by mouth daily. Need Office visit for additional refills 90 tablet 0  . ipratropium-albuterol (DUONEB) 0.5-2.5 (3) MG/3ML SOLN Take 3 mLs by nebulization 2 (two) times daily. 540 mL 0  . levocetirizine (XYZAL) 5 MG tablet Take 5 mg by mouth at bedtime.  6  . magnesium oxide (MAG-OX) 400 (241.3 Mg) MG tablet Take 1 tablet (400 mg total) by mouth daily. Follow-up appt is due for future refills 90 tablet 0  . montelukast (SINGULAIR) 10 MG tablet Take 1 tablet (10 mg total) by mouth at bedtime. 90 tablet 1  . Ostomy Supplies KIT 8514 is number on bag one piece unit. Colostomy bag and adhesive and alcohol prep, no sting, cannot use regular prep pad. 30 each 11  . pantoprazole (PROTONIX) 40 MG tablet Take 1 tablet (40 mg total) by mouth daily. Follow-up appt is  due for future refills 90 tablet 1  . PARoxetine (PAXIL) 20 MG tablet Take 1 tablet (20 mg total) by mouth at bedtime. 90 tablet 1  . vitamin B-12 (CYANOCOBALAMIN) 1000 MCG tablet Take 1,000 mcg by mouth daily.     No current facility-administered medications for this visit.    Facility-Administered Medications Ordered in Other Visits  Medication Dose Route Frequency Provider Last Rate Last Dose  . sodium phosphate (FLEET) 7-19 GM/118ML enema 2 enema  2 enema Rectal Once Ileana Roup, MD         Past Medical History:  Diagnosis Date  . Anal squamous cell carcinoma ([place]) 07/2016   "spread to lymph  nodes and groins"   . Anemia   . Anxiety   . Atrial fibrillation ([place])   . Basal cell carcinoma of skin of nose   . CHF (congestive heart failure) ([place]) 11/2015  . Chronic bronchitis ([place])   . Chronic depression   . Colostomy in place [facility]) 07/2016  . Diverticular disease   . Fibrocystic disease of breast   . Fracture of shoulder   . GERD (gastroesophageal reflux disease)   . GI bleed 12/10/2016   in setting of no PPI. 5/2 EGD: grade A reflux esophagitis.  Mild, non-hemorrhagic gastritis.  Ablation of 3 Duodenal AVMs  . Herpes zoster   . History of blood transfusion    for vaginal, uterine bleeding leading to hysterectomy.  in 12/2016 transfused x 1 for GI bleed.   Marland Kitchen History of hiatal hernia   . Hyperlipidemia   . Hypertension   . Labyrinthitis   . Lung cancer ([place])    RML  . Obesity   . Paroxysmal supraventricular tachycardia ([place])   . Peptic ulcer of stomach    hx  . Pneumonia 1980s X 1; 08/2016   "walking; double"  . Pulmonary embolism ([place]) 05/2014   "left lung"  . Vitamin D deficiency     ROS:   All systems reviewed and negative except as noted in the HPI.   Past Surgical History:  Procedure Laterality Date  . APPENDECTOMY  1959  . BASAL CELL CARCINOMA EXCISION     nose  . BREAST CYST ASPIRATION Right 2016?  Marland Kitchen CATARACT EXTRACTION W/ INTRAOCULAR LENS  IMPLANT, BILATERAL Bilateral 1990s  . CHOLECYSTECTOMY OPEN  1980s  . COLOSTOMY N/A 07/29/2016   Procedure: COLOSTOMY;  Surgeon: Clovis Riley, MD;  Location: [facility];  Service: General;  Laterality: N/A;  . DILATION AND CURETTAGE OF UTERUS    . ESOPHAGOGASTRODUODENOSCOPY N/A 12/11/2016   Procedure: ESOPHAGOGASTRODUODENOSCOPY (EGD);  Surgeon: Jerene Bears, MD;  Location: [facility] END[facility]COPY;  Service: Endoscopy;  Laterality: N/A;  . EVALUATION UNDER ANESTHESIA WITH HEMORRHOIDECTOMY AND PROCTOSCOPY N/A 07/25/2016   Procedure: EXAM UNDER ANESTHESIA, EXCISION PERIANAL MASS.;  Surgeon: Judeth Horn, MD;  Location: [facility];   Service: General;  Laterality: N/A;  Prone position  . FLEXIBLE SIGMOIDOSCOPY N/A 06/16/2017   Procedure: FLEXIBLE SIGMOIDOSCOPY EXAM UNDER ANESHESIA;  Surgeon: Ileana Roup, MD;  Location: [facility];  Service: General;  Laterality: N/A;  . IR RADIOLOGIST EVAL & MGMT  03/25/2017  . LAPAROSCOPIC DIVERTED COLOSTOMY N/A 07/29/2016   Procedure: ATTEMPTED LAPAROSCOPIC ASSISTED COLOSTOMY;  Surgeon: Clovis Riley, MD;  Location: [facility];  Service: General;  Laterality: N/A;  . LUNG REMOVAL, PARTIAL Right 2013   RML mass/carcinoid, Dr Lurena Nida  . TONSILLECTOMY AND ADENOIDECTOMY  1960s  . VAGINAL HYSTERECTOMY  1959   "partial"  Family History  Problem Relation Age of Onset  . Heart attack Mother        Dec 1987  . CVA Mother   . Hypertension Mother   . Multiple myeloma Mother   . Breast cancer Sister   . Skin cancer Sister   . Prostate cancer Brother   . Throat cancer Brother   . Cervical cancer Daughter   . Leukemia Daughter   . Leukemia Son   . Throat cancer Brother   . Cancer Brother        soft palette  . Heart Problems Brother        Pacemaker  . Colon cancer Neg Hx   . Pancreatic cancer Neg Hx      Social History   Socioeconomic History  . Marital status: Widowed    Spouse name: Not on file  . Number of children: 5  . Years of education: Not on file  . Highest education level: Not on file  Occupational History  . Occupation: retired  Scientific laboratory technician  . Financial resource strain: Not on file  . Food insecurity:    Worry: Not on file    Inability: Not on file  . Transportation needs:    Medical: Not on file    Non-medical: Not on file  Tobacco Use  . Smoking status: Former Smoker    Packs/day: 0.50    Years: 15.00    Pack years: 7.50    Types: Cigarettes    Last attempt to quit: 1976    Years since quitting: 43.3  . Smokeless tobacco: Never Used  Substance and Sexual Activity  . Alcohol use: No  . Drug use: No  . Sexual activity: Never    Lifestyle  . Physical activity:    Days per week: Not on file    Minutes per session: Not on file  . Stress: Not on file  Relationships  . Social connections:    Talks on phone: Not on file    Gets together: Not on file    Attends religious service: Not on file    Active member of club or organization: Not on file    Attends meetings of clubs or organizations: Not on file    Relationship status: Not on file  . Intimate partner violence:    Fear of current or ex partner: Not on file    Emotionally abused: Not on file    Physically abused: Not on file    Forced sexual activity: Not on file  Other Topics Concern  . Not on file  Social History Narrative  . Not on file     BP 128/62   Pulse (!) 50   Ht 5' 4" (1.626 m)   Wt 228 lb (103.4 kg)   SpO2 98%   BMI 39.14 kg/m   Physical Exam:  Well appearing 84 yo woman, NAD HEENT: Unremarkable Neck:  6 cm JVD, no thyromegally Lymphatics:  No adenopathy Back:  No CVA tenderness Lungs:  Clear with no wheezes HEART:  Regular rate rhythm, no murmurs, no rubs, no clicks Abd:  soft, positive bowel sounds, no organomegally, no rebound, no guarding Ext:  2 plus pulses, no edema, no cyanosis, no clubbing Skin:  No rashes no nodules Neuro:  CN II through XII intact, motor grossly intact  EKG - NSR  DEVICE  Normal device function.  See PaceArt for details.   Assess/Plan: 1. Atrial fib - she is maintaining NSR on dofetilide very nicely. She  will continue.  2. Obesity - her weight is coming down and I have encouraged the patient to get down to under 200 lbs. 3. HTN - her blood pressure is well controlled today. She is encouraged to lose weight and maintain a low sodium diet. 4. Diastolic heart failure - her symptoms are class 2A. She will continue her current meds. I have asked that she check her BP if she continues to feel dizzy on awakening in the morning.  Mikle Bosworth.D.

## 2017-11-25 ENCOUNTER — Encounter: Payer: Self-pay | Admitting: Nurse Practitioner

## 2017-11-25 ENCOUNTER — Other Ambulatory Visit (INDEPENDENT_AMBULATORY_CARE_PROVIDER_SITE_OTHER): Payer: Medicare Other

## 2017-11-25 ENCOUNTER — Ambulatory Visit (INDEPENDENT_AMBULATORY_CARE_PROVIDER_SITE_OTHER): Payer: Medicare Other | Admitting: Nurse Practitioner

## 2017-11-25 VITALS — BP 142/60 | HR 51 | Temp 98.9°F | Resp 16 | Ht 64.0 in | Wt 228.0 lb

## 2017-11-25 DIAGNOSIS — R51 Headache: Secondary | ICD-10-CM

## 2017-11-25 DIAGNOSIS — E782 Mixed hyperlipidemia: Secondary | ICD-10-CM

## 2017-11-25 DIAGNOSIS — R42 Dizziness and giddiness: Secondary | ICD-10-CM

## 2017-11-25 DIAGNOSIS — R519 Headache, unspecified: Secondary | ICD-10-CM

## 2017-11-25 LAB — COMPREHENSIVE METABOLIC PANEL
ALBUMIN: 4.1 g/dL (ref 3.5–5.2)
ALK PHOS: 40 U/L (ref 39–117)
ALT: 12 U/L (ref 0–35)
AST: 20 U/L (ref 0–37)
BUN: 26 mg/dL — AB (ref 6–23)
CO2: 34 mEq/L — ABNORMAL HIGH (ref 19–32)
CREATININE: 1.24 mg/dL — AB (ref 0.40–1.20)
Calcium: 9.8 mg/dL (ref 8.4–10.5)
Chloride: 102 mEq/L (ref 96–112)
GFR: 43.76 mL/min — ABNORMAL LOW (ref >60.00)
Glucose, Bld: 91 mg/dL (ref 70–99)
Potassium: 4.4 mEq/L (ref 3.5–5.1)
SODIUM: 143 meq/L (ref 135–145)
TOTAL PROTEIN: 6.6 g/dL (ref 6.0–8.3)
Total Bilirubin: 0.4 mg/dL (ref 0.2–1.2)

## 2017-11-25 LAB — CBC
HCT: 38 % (ref 36.0–46.0)
Hemoglobin: 12.4 g/dL (ref 12.0–15.0)
MCHC: 32.7 g/dL (ref 30.0–36.0)
MCV: 84.3 fl (ref 78.0–100.0)
Platelets: 161 10*3/uL (ref 150.0–400.0)
RBC: 4.51 Mil/uL (ref 3.87–5.11)
RDW: 15.5 % (ref 11.5–15.5)
WBC: 4.6 10*3/uL (ref 4.0–10.5)

## 2017-11-25 IMAGING — CT CT ABD-PELV W/ CM
2 of 5 series · 12 of 36 positions shown, 15 images · IV contrast (APPLIED)
Comparison: 12/10/2016 chest radiograph. 09/16/2016 PET-CT.
08/27/2016 chest CT. 07/23/2016 CT abdomen/ pelvis .

CLINICAL DATA: Anal squamous cell carcinoma diagnosed July 2016
status post radiation therapy completed 09/25/2016. Additional
reported history of carcinoid tumors of the lung, reportedly status
post right middle lobectomy. Patient presents for restaging.

EXAM:
CT CHEST, ABDOMEN, AND PELVIS WITH CONTRAST
TECHNIQUE: Multidetector CT imaging of the chest, abdomen and pelvis was
performed following the standard protocol during bolus
administration of intravenous contrast.
CONTRAST:  100mL LF58SI-SPP IOPAMIDOL (LF58SI-SPP) INJECTION 61%

[Series 2: cap with · axial · 0.98mm/px · z∈[+1180,+1675]mm · 9 of 123 slices shown, 12 images]
[im 12/123  mediastinal]
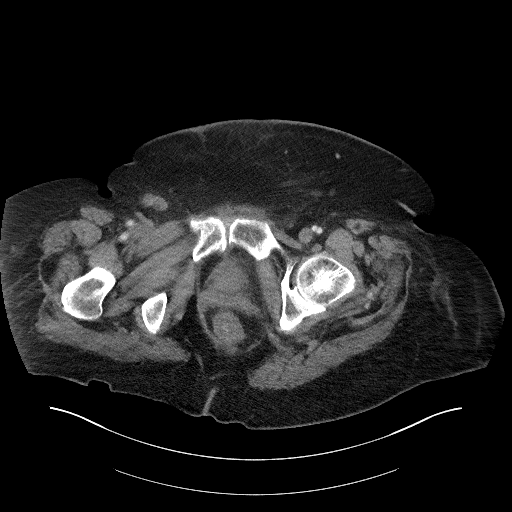
[im 12/123  lung]
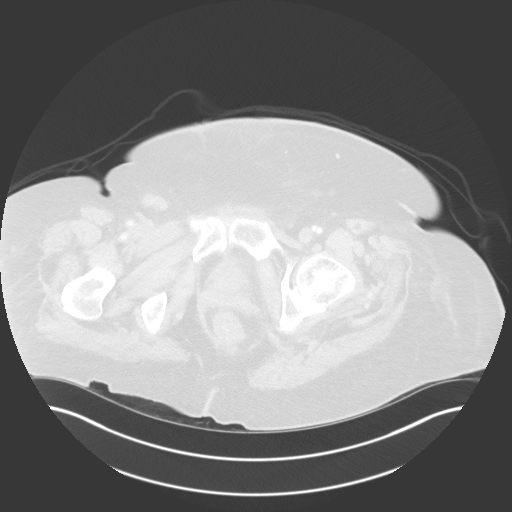
[im 23/123  lung]
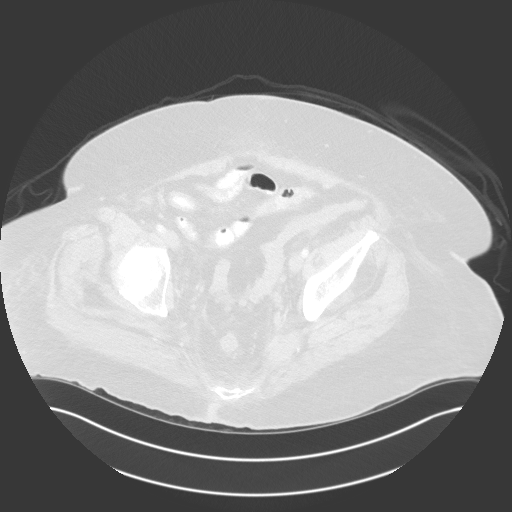
[im 34/123  lung]
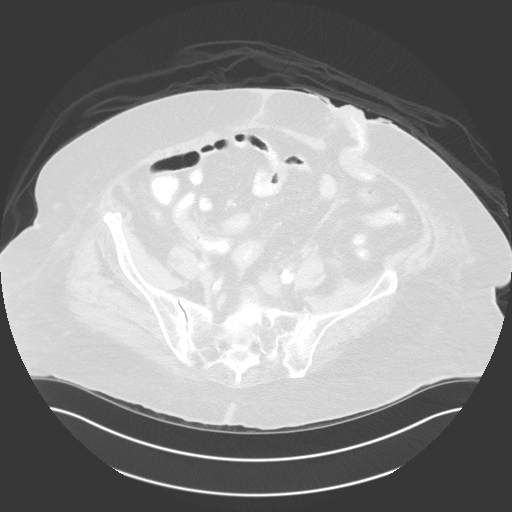
[im 45/123  lung]
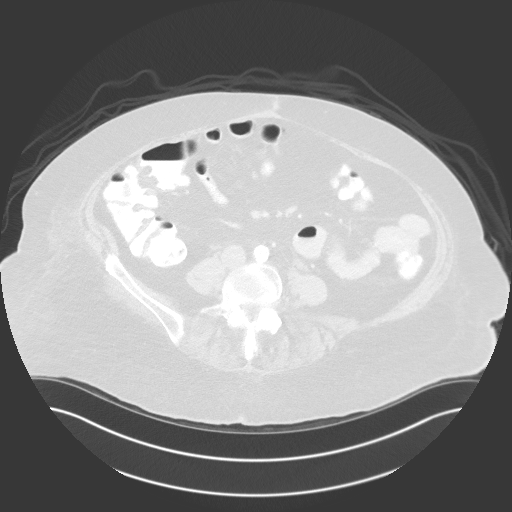
[im 67/123  mediastinal]
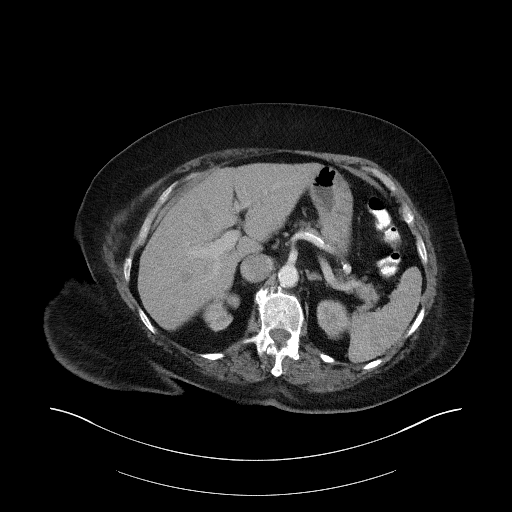
[im 67/123  lung]
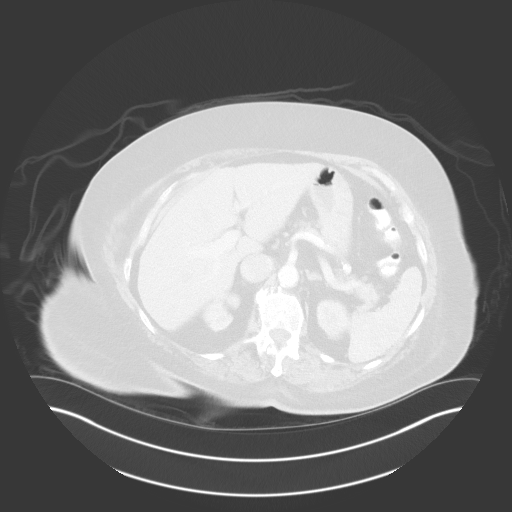
[im 78/123  lung]
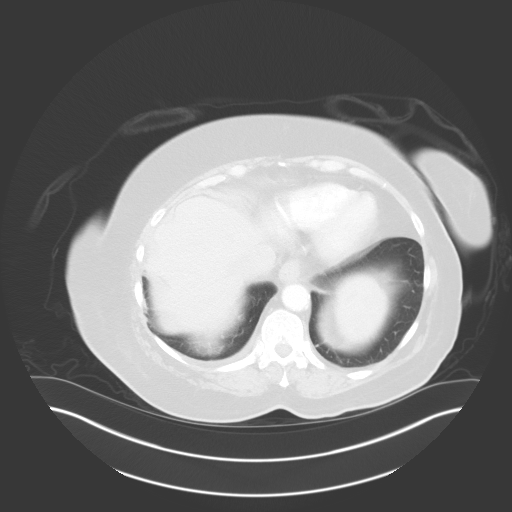
[im 89/123  lung]
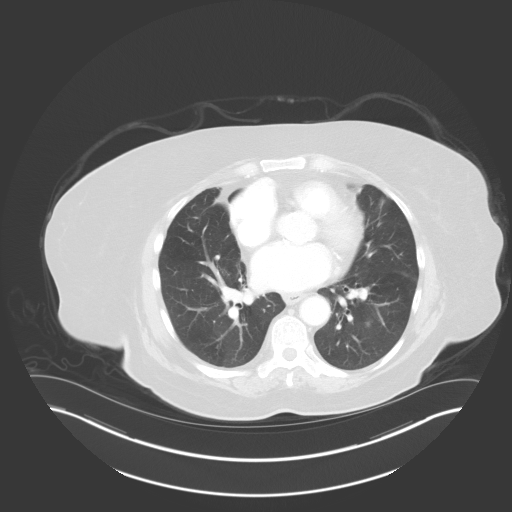
[im 100/123  lung]
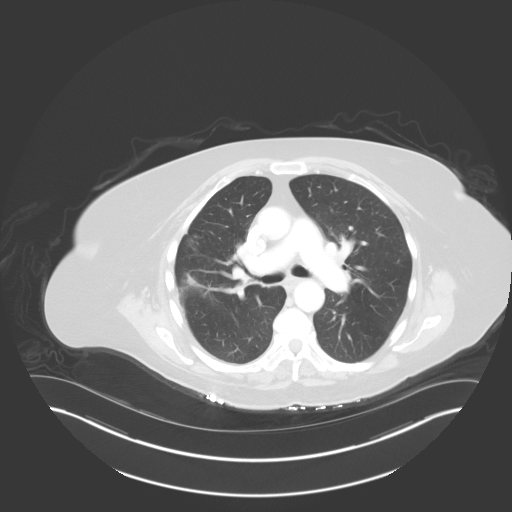
[im 111/123  mediastinal]
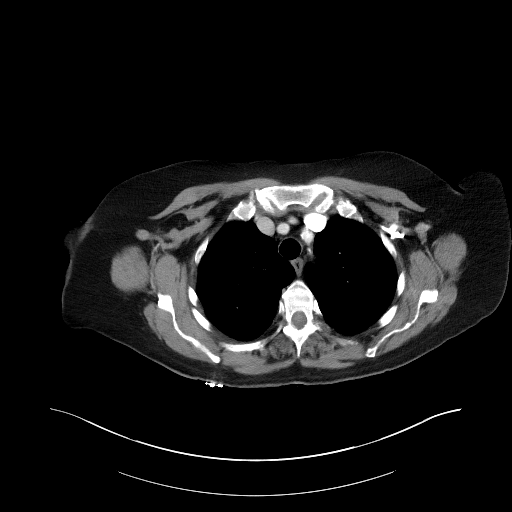
[im 111/123  lung]
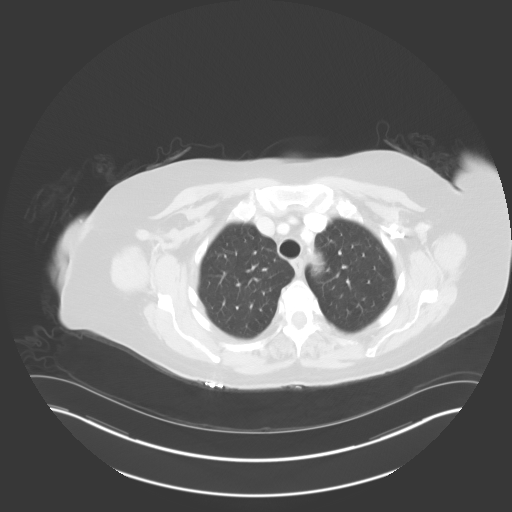

[Series 6: coronals · coronal · 0.82mm/px · 3 of 151 slices shown]
[im 31/151  lung]
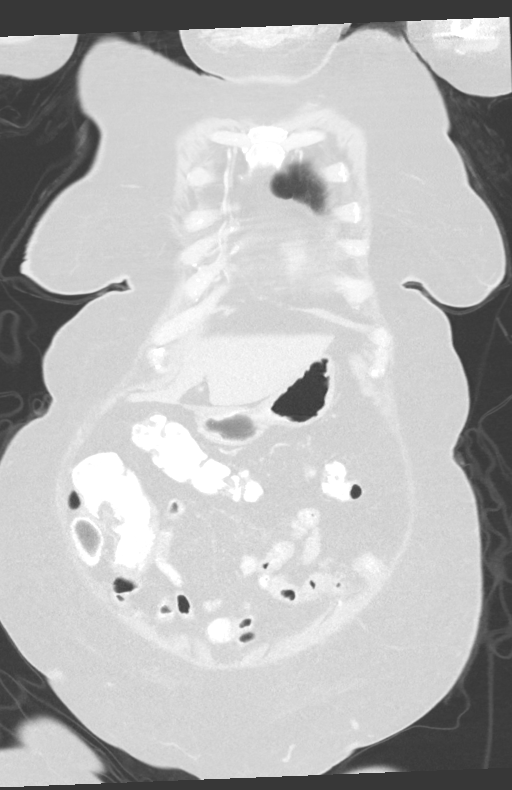
[im 61/151  lung]
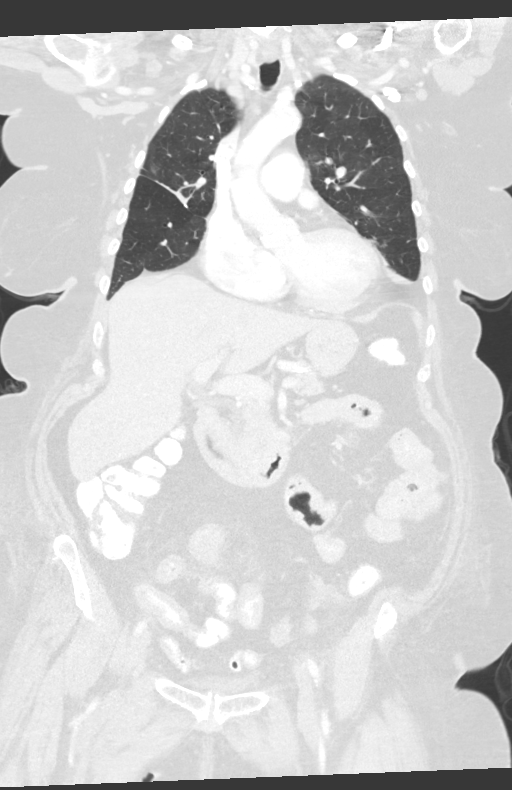
[im 91/151  lung]
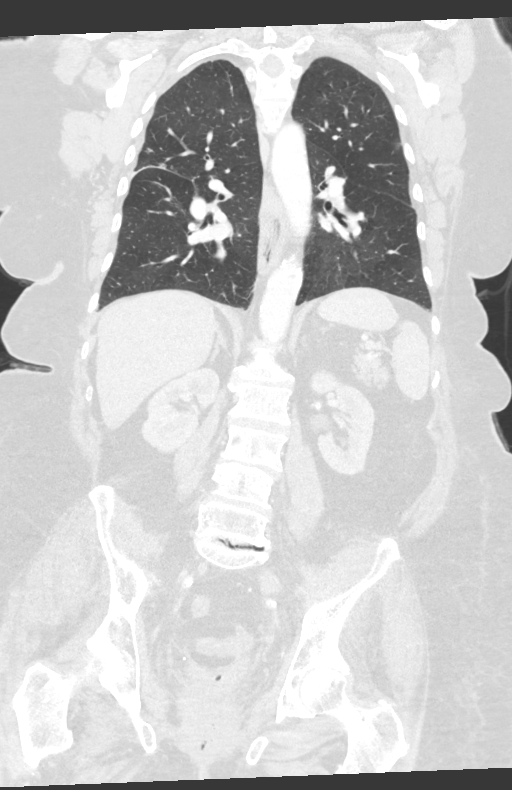

[12 of 36 positions shown; findings below may reference images not displayed]

FINDINGS: CT CHEST FINDINGS

Cardiovascular: Stable mild cardiomegaly. No significant pericardial
fluid/thickening. Left anterior descending, left circumflex and
right coronary atherosclerosis. Atherosclerotic nonaneurysmal
thoracic aorta. Stable top-normal caliber main pulmonary artery (3.0
cm diameter). No central pulmonary emboli.

Mediastinum/Nodes: No discrete thyroid nodules. Unremarkable
esophagus. No pathologically enlarged axillary, mediastinal or hilar
lymph nodes.

Lungs/Pleura: No pneumothorax. No pleural effusion. Stable
postsurgical changes from partial right middle lobectomy. No acute
consolidative airspace disease or lung masses. Clustered nodularity
in the peripheral right lower lobe measuring up to 8 8 x 4 mm
(series 5/image 70), decreased from 17 x 6 mm using similar
measurement technique on 07/25/2016. Several additional scattered
solid pulmonary nodules in both lungs measuring up to the 4 mm in
the lingula (series 5/image 70), all unchanged since 07/25/2016.
Ground-glass 8 mm peripheral left upper lobe pulmonary nodule
(series 5/ image 52), not appreciably changed since 07/25/2016. No
new significant pulmonary nodules.

Musculoskeletal: No aggressive appearing focal osseous lesions.
Minimal thoracic spondylosis.

CT ABDOMEN PELVIS FINDINGS

Hepatobiliary: Normal liver with no liver mass. Cholecystectomy. No
biliary ductal dilatation.

Pancreas: Normal, with no mass or duct dilation.

Spleen: Normal size. No mass.

Adrenals/Urinary Tract: Normal adrenals. No hydronephrosis. Stable
scattered mild renal cortical scarring in the right kidney. Simple
1.3 cm renal cyst in the medial interpolar left kidney. Collapsed
bladder. Punctate foci of gas in the anterior bladder lumen, similar
to the 07/23/2016 CT study. No definite bladder wall thickening
accounting for the collapsed state .

Stomach/Bowel: Grossly normal stomach. Normal caliber small bowel
with no small bowel wall thickening. Appendix not discretely
visualized. No pericecal inflammatory changes. Status post end
sigmoid colostomy. Oral contrast reaches the colostomy site. Focal
masslike wall thickening in the posterior lower left rectum
measuring 1.9 x 1.5 cm (series 2/image 109), not definitely seen on
the noncontrast CT images from the 09/16/2016 PET-CT. Otherwise no
large bowel wall thickening. Mild diverticulosis in the collapsed
distal sigmoid colon.

Vascular/Lymphatic: Atherosclerotic nonaneurysmal abdominal aorta.
Patent portal, splenic, hepatic and renal veins. Mildly enlarged
cm right inguinal node (series 2/image 111), decreased from 2.4 cm
on 09/16/2016. Otherwise no pathologically enlarged abdominopelvic
lymph nodes.

Reproductive: Status post hysterectomy, with no abnormal findings at
the vaginal cuff. No adnexal mass.

Other: No pneumoperitoneum, ascites or focal fluid collection.

Musculoskeletal: No aggressive appearing focal osseous lesions.
Moderate lumbar spondylosis.
IMPRESSION: 1. Nonspecific mild focal masslike wall thickening in the posterior
left lower rectum, not definitely seen on 09/16/2016 PET-CT, cannot
exclude local tumor recurrence. Consider endoscopic or PET-CT
correlation.
2. Mild right inguinal lymphadenopathy, decreased since 09/16/2016,
suggesting treatment response. Otherwise no new or progressive
metastatic disease in the chest, abdomen or pelvis.
3. Scattered small bilateral pulmonary nodules are stable to
decreased since 07/25/2016 chest CT, favor benign nodularity.
Continued chest CT surveillance advised in 6 months.
4. Nonspecific punctate foci of gas in the collapsed bladder.
Correlate for history of recent bladder instrumentation. Correlate
with urinalysis as clinically warranted.
5. Additional findings include aortic atherosclerosis, three-vessel
coronary atherosclerosis, mild cardiomegaly and mild sigmoid
diverticulosis.

## 2017-11-25 NOTE — Patient Instructions (Addendum)
Please head downstairs for lab work/x-rays. If any of your test results are critically abnormal, you will be contacted right away. Your results may be released to your MyChart for viewing before I am able to provide you with my response. I will contact you within a week about your test results and any recommendations for abnormalities.  Please return in about 2-3 weeks so that I can see how you are doing. If your symptoms do not get better, we may need to do more testing.  It was good to meet you. Thanks for letting me take care of you today :)    General Headache Without Cause A headache is pain or discomfort felt around the head or neck area. There are many causes and types of headaches. In some cases, the cause may not be found. Follow these instructions at home: Managing pain  Take over-the-counter and prescription medicines only as told by your doctor.  Lie down in a dark, quiet room when you have a headache.  If directed, apply ice to the head and neck area: ? Put ice in a plastic bag. ? Place a towel between your skin and the bag. ? Leave the ice on for 20 minutes, 2-3 times per day.  Use a heating pad or hot shower to apply heat to the head and neck area as told by your doctor.  Keep lights dim if bright lights bother you or make your headaches worse. Eating and drinking  Eat meals on a regular schedule.  Lessen how much alcohol you drink.  Lessen how much caffeine you drink, or stop drinking caffeine. General instructions  Keep all follow-up visits as told by your doctor. This is important.  Keep a journal to find out if certain things bring on headaches. For example, write down: ? What you eat and drink. ? How much sleep you get. ? Any change to your diet or medicines.  Relax by getting a massage or doing other relaxing activities.  Lessen stress.  Sit up straight. Do not tighten (tense) your muscles.  Do not use tobacco products. This includes cigarettes,  chewing tobacco, or e-cigarettes. If you need help quitting, ask your doctor.  Exercise regularly as told by your doctor.  Get enough sleep. This often means 7-9 hours of sleep. Contact a doctor if:  Your symptoms are not helped by medicine.  You have a headache that feels different than the other headaches.  You feel sick to your stomach (nauseous) or you throw up (vomit).  You have a fever. Get help right away if:  Your headache becomes really bad.  You keep throwing up.  You have a stiff neck.  You have trouble seeing.  You have trouble speaking.  You have pain in the eye or ear.  Your muscles are weak or you lose muscle control.  You lose your balance or have trouble walking.  You feel like you will pass out (faint) or you pass out.  You have confusion. This information is not intended to replace advice given to you by your health care provider. Make sure you discuss any questions you have with your health care provider. Document Released: 05/07/2008 Document Revised: 01/04/2016 Document Reviewed: 11/21/2014 Elsevier Interactive Patient Education  [employer].

## 2017-11-25 NOTE — Progress Notes (Signed)
Name: Tricia Hernandez   MRN: [id number]    DOB: 10-12-1930   Date:11/25/2017       Progress Note  Subjective  Chief Complaint  Chief Complaint  Patient presents with  . Establish Care    fasting incase needed labs, thyroid, head and ear pain since xyzal, dizziness    HPI Tricia Hernandez is establishing care with me as new PCP today, transferring from another provider who left this clinic. She is currently following with cardiology for atrial fibrillation, heart failure, hypertension, chronic anticoagulation; pulmonology for chronic bronchitis and lung nodules; gastroenterology for anal cancer and anemia; Heme/oncology for malignant neoplasm of lung, anal cancer, and anemia. She would like to discuss dizziness and headaches today and is fasting for labs if needed.  Dizziness- This is a new problem. The dizziness began after a viral URI and ear infection in January and has improved but she continues to feel some mild dizziness almost daily. The dizziness is worse when she stands too quickly or tilts her head forward. She has been seen by ENT and cardiology for evaluation of the dizziness Her ENT started her on xyzal for allergies and noticed after she started the xyzal she began to have a "sore" pain in her head. The head pain is anterior and posterior and is constant and dull. She denies any syncope, falls, sleep disturbance, neck or back pain, confusion, weakness, vision changes, nausea, vomiting. She has not tried anything for the headaches. She has continued the xyzal.   Patient Active Problem List   Diagnosis Date Noted  . Vitamin D deficiency 05/27/2017  . Venous stasis dermatitis of both lower extremities 02/23/2017  . Anemia 02/22/2017  . Acid reflux 02/22/2017  . Colostomy care ([place]) 12/20/2016  . Angiodysplasia of duodenum   . GI bleeding 12/10/2016  . Heme positive stool 12/10/2016  . Mixed hyperlipidemia 11/05/2016  . Chronic bronchitis ([place]) 11/04/2016  . Persistent atrial  fibrillation ([place]) 10/07/2016  . HCAP (healthcare-associated pneumonia) 08/26/2016  . Acute respiratory failure with hypoxia ([place]) 08/26/2016  . Essential hypertension   . Lung nodule   . Palliative care encounter   . Symptomatic anemia 07/22/2016  . Anal cancer ([place]) 07/22/2016  . BRBPR (bright red blood per rectum) 07/22/2016  . Elevated troponin 07/22/2016  . Atrial fibrillation ([place]), CHA2DS2-VASc Score 5 07/22/2016  . Pulmonary embolus ([place])   . Obesity   . Lung cancer ([place])   . Hypertension     Past Surgical History:  Procedure Laterality Date  . APPENDECTOMY  1959  . BASAL CELL CARCINOMA EXCISION     nose  . BREAST CYST ASPIRATION Right 2016?  Marland Kitchen CATARACT EXTRACTION W/ INTRAOCULAR LENS  IMPLANT, BILATERAL Bilateral 1990s  . CHOLECYSTECTOMY OPEN  1980s  . COLOSTOMY N/A 07/29/2016   Procedure: COLOSTOMY;  Surgeon: Clovis Riley, MD;  Location: [facility];  Service: General;  Laterality: N/A;  . DILATION AND CURETTAGE OF UTERUS    . ESOPHAGOGASTRODUODENOSCOPY N/A 12/11/2016   Procedure: ESOPHAGOGASTRODUODENOSCOPY (EGD);  Surgeon: Jerene Bears, MD;  Location: [facility] ENDOSCOPY;  Service: Endoscopy;  Laterality: N/A;  . EVALUATION UNDER ANESTHESIA WITH HEMORRHOIDECTOMY AND PROCTOSCOPY N/A 07/25/2016   Procedure: EXAM UNDER ANESTHESIA, EXCISION PERIANAL MASS.;  Surgeon: Judeth Horn, MD;  Location: [facility];  Service: General;  Laterality: N/A;  Prone position  . FLEXIBLE SIGMOIDOSCOPY N/A 06/16/2017   Procedure: FLEXIBLE SIGMOIDOSCOPY EXAM UNDER ANESHESIA;  Surgeon: Ileana Roup, MD;  Location: [facility];  Service: General;  Laterality: N/A;  .  IR RADIOLOGIST EVAL & MGMT  03/25/2017  . LAPAROSCOPIC DIVERTED COLOSTOMY N/A 07/29/2016   Procedure: ATTEMPTED LAPAROSCOPIC ASSISTED COLOSTOMY;  Surgeon: Clovis Riley, MD;  Location: [facility];  Service: General;  Laterality: N/A;  . LUNG REMOVAL, PARTIAL Right 2013   RML mass/carcinoid, Dr Lurena Nida  . TONSILLECTOMY AND ADENOIDECTOMY  1960s   . VAGINAL HYSTERECTOMY  1959   "partial"    Family History  Problem Relation Age of Onset  . Heart attack Mother        Dec 1987  . CVA Mother   . Hypertension Mother   . Multiple myeloma Mother   . Breast cancer Sister   . Skin cancer Sister   . Prostate cancer Brother   . Throat cancer Brother   . Cervical cancer Daughter   . Leukemia Daughter   . Leukemia Son   . Throat cancer Brother   . Cancer Brother        soft palette  . Heart Problems Brother        Pacemaker  . Colon cancer Neg Hx   . Pancreatic cancer Neg Hx     Social History   Socioeconomic History  . Marital status: Widowed    Spouse name: Not on file  . Number of children: 5  . Years of education: Not on file  . Highest education level: Not on file  Occupational History  . Occupation: retired  Scientific laboratory technician  . Financial resource strain: Not on file  . Food insecurity:    Worry: Not on file    Inability: Not on file  . Transportation needs:    Medical: Not on file    Non-medical: Not on file  Tobacco Use  . Smoking status: Former Smoker    Packs/day: 0.50    Years: 15.00    Pack years: 7.50    Types: Cigarettes    Last attempt to quit: 1976    Years since quitting: 43.3  . Smokeless tobacco: Never Used  Substance and Sexual Activity  . Alcohol use: No  . Drug use: No  . Sexual activity: Never  Lifestyle  . Physical activity:    Days per week: Not on file    Minutes per session: Not on file  . Stress: Not on file  Relationships  . Social connections:    Talks on phone: Not on file    Gets together: Not on file    Attends religious service: Not on file    Active member of club or organization: Not on file    Attends meetings of clubs or organizations: Not on file    Relationship status: Not on file  . Intimate partner violence:    Fear of current or ex partner: Not on file    Emotionally abused: Not on file    Physically abused: Not on file    Forced sexual activity: Not on file   Other Topics Concern  . Not on file  Social History Narrative  . Not on file     Current Outpatient Medications:  .  acetaminophen (TYLENOL) 500 MG tablet, Take 500 mg by mouth daily as needed for moderate pain., Disp: , Rfl:  .  arformoterol (BROVANA) 15 MCG/2ML NEBU, Take 2 mLs (15 mcg total) by nebulization 2 (two) times daily. Dx copd: J44.9, Disp: 120 mL, Rfl: 5 .  atorvastatin (LIPITOR) 20 MG tablet, Take 1 tablet (20 mg total) by mouth daily. (Patient taking differently: Take 20 mg by mouth at  bedtime. ), Disp: 90 tablet, Rfl: 1 .  Biotin 10000 MCG TABS, Take 10,000 mcg by mouth 2 (two) times daily., Disp: , Rfl:  .  budesonide (PULMICORT) 0.5 MG/2ML nebulizer solution, Take 2 mLs (0.5 mg total) by nebulization 2 (two) times daily. Dx code: j44.9, Disp: 120 mL, Rfl: 5 .  Calcium 600-200 MG-UNIT tablet, Take 1 tablet by mouth daily., Disp: , Rfl:  .  dofetilide (TIKOSYN) 500 MCG capsule, Take 1 capsule (500 mcg total) by mouth 2 (two) times daily., Disp: 180 capsule, Rfl: 0 .  ergocalciferol (VITAMIN D2) 50000 units capsule, Take 1 capsule (50,000 Units total) by mouth once a week., Disp: 12 capsule, Rfl: 3 .  fenofibrate 160 MG tablet, Take 1 tablet (160 mg total) by mouth daily., Disp: 90 tablet, Rfl: 1 .  ferrous sulfate 325 (65 FE) MG EC tablet, Take 325 mg by mouth 2 (two) times daily., Disp: , Rfl:  .  fluticasone (FLONASE) 50 MCG/ACT nasal spray, Place 1 spray into both nostrils daily., Disp: , Rfl:  .  furosemide (LASIX) 20 MG tablet, Take 1 tablet (20 mg total) by mouth daily. Need Office visit for additional refills, Disp: 90 tablet, Rfl: 0 .  ipratropium-albuterol (DUONEB) 0.5-2.5 (3) MG/3ML SOLN, Take 3 mLs by nebulization 2 (two) times daily., Disp: 540 mL, Rfl: 0 .  levocetirizine (XYZAL) 5 MG tablet, Take 5 mg by mouth at bedtime., Disp: , Rfl: 6 .  magnesium oxide (MAG-OX) 400 (241.3 Mg) MG tablet, Take 1 tablet (400 mg total) by mouth daily. Follow-up appt is due for  future refills, Disp: 90 tablet, Rfl: 0 .  montelukast (SINGULAIR) 10 MG tablet, Take 1 tablet (10 mg total) by mouth at bedtime., Disp: 90 tablet, Rfl: 1 .  Ostomy Supplies KIT, 8514 is number on bag one piece unit. Colostomy bag and adhesive and alcohol prep, no sting, cannot use regular prep pad., Disp: 30 each, Rfl: 11 .  pantoprazole (PROTONIX) 40 MG tablet, Take 1 tablet (40 mg total) by mouth daily. Follow-up appt is due for future refills, Disp: 90 tablet, Rfl: 1 .  PARoxetine (PAXIL) 20 MG tablet, Take 1 tablet (20 mg total) by mouth at bedtime., Disp: 90 tablet, Rfl: 1 .  vitamin B-12 (CYANOCOBALAMIN) 1000 MCG tablet, Take 1,000 mcg by mouth daily., Disp: , Rfl:  No current facility-administered medications for this visit.   Facility-Administered Medications Ordered in Other Visits:  .  sodium phosphate (FLEET) 7-19 GM/118ML enema 2 enema, 2 enema, Rectal, Once, White, Sharon Mt, MD  Allergies  Allergen Reactions  . Penicillins     Reaction:  Unknown  Has patient had a PCN reaction causing immediate rash, facial/tongue/throat swelling, SOB or lightheadedness with hypotension: Unsure Has patient had a PCN reaction causing severe rash involving mucus membranes or skin necrosis: Unsure Has patient had a PCN reaction that required hospitalization Unsure Has patient had a PCN reaction occurring within the last 10 years: Unsure If all of the above answers are "NO", then may proceed with Cephalosporin use.   [name] [Naproxen Sodium] Hives  . Antihistamines, Chlorpheniramine-Type Rash    Reaction:  Unknown   . Aspirin Rash  . Benadryl [Diphenhydramine Hcl] Palpitations    Increases heart rate  . Codeine Rash  . Hydrochlorothiazide Rash  . Rocephin [Ceftriaxone Sodium In Dextrose] Rash  . Sulfa Antibiotics Rash     ROS See HPI  Objective  Vitals:   11/25/17 0847  BP: (!) 142/60  Pulse: (!) 51  Resp: 16  Temp: 98.9 F (37.2 C)  TempSrc: Oral  SpO2: 96%  Weight:  228 lb (103.4 kg)  Height: 5' 4"  (1.626 m)   HR stable  Body mass index is 39.14 kg/m.  Physical Exam Vital signs reviewed. Constitutional: Patient appears well-developed and well-nourished. No distress.  HENT: Head: Normocephalic and atraumatic. Ears: Bilateral TMs without erythema or effusion; Nose: Nose normal. Mouth/Throat: Oropharynx is clear and moist. No oropharyngeal exudate.  Eyes: Conjunctivae and EOM are normal. Pupils are equal, round, and reactive to light. No scleral icterus.  Neck: Normal range of motion. Neck supple.  Cardiovascular: Normal rate, regular rhythm and normal heart sounds.  No murmur heard. No BLE edema. Distal pulses intact. Pulmonary/Chest: Effort normal and breath sounds normal. No respiratory distress. Abdominal: Soft. No distension. Musculoskeletal: Normal range of motion, No gross deformities Neurological: She is alert and oriented to person, place, and time. No cranial nerve deficit. Coordination, balance, strength, speech and gait are normal.  Skin: Skin is warm and dry. No rash noted. No erythema.  Psychiatric: Patient has a normal mood and affect. behavior is normal. Judgment and thought content normal.   Assessment & Plan RTC in 2-3 weeks for follow up of dizziness, headaches  -Reviewed Health Maintenance: up to date  Dizziness Could be related to allergies or meds. Recent evaluation by cardiology on 4/12,  neuro exam is normal today Will check labs for anemia, dehydration or other causes today TSH 05/27/17 WNL Return precautions discussed and printed on AVS RTC in 2-3 weeks for F/U, may need to consider imaging/additional testing if dizziness persists  - CBC; Future - Comprehensive metabolic panel; Future  Nonintractable episodic headache, unspecified headache type Could be related to allergies or meds. neuro exam is normal today Will check labs for anemia, dehydration or other abnormalities today TSH 05/27/17 WNL Home management of  headaches and Return precautions discussed and printed on AVS RTC in 2-3 weeks for F/U, may need to consider imaging/additional testing if headaches persist - CBC; Future - Comprehensive metabolic panel; Future

## 2017-11-27 ENCOUNTER — Telehealth: Payer: Self-pay | Admitting: Nurse Practitioner

## 2017-11-27 ENCOUNTER — Encounter: Payer: Self-pay | Admitting: Nurse Practitioner

## 2017-11-27 NOTE — Assessment & Plan Note (Signed)
Stable on atorvastatin, fenofibrate Lipid panel up to date Continue medications at current dosages

## 2017-11-27 NOTE — Telephone Encounter (Signed)
Letter has been completed and patient informed.   Requested it to be mailed to them. This has been done.

## 2017-11-27 NOTE — Telephone Encounter (Signed)
Copied from [place] [phone]. Topic: Quick Communication - See Telephone Encounter >> Oct 23, 2017  2:38 PM Cleaster Corin, NT wrote: CRM for notification. See Telephone encounter for:   10/23/17. Pt. Needing a letter stating that she can not walk to her mailbox with her health conditions. Pt. Cb number [phone]  >> Nov 27, 2017  9:26 AM Cleaster Corin, NT wrote: Pt. Calling back Pt. Needing a letter stating that she can not walk to her mailbox with her health conditions. Pt. Cb number [phone]

## 2017-11-27 NOTE — Telephone Encounter (Signed)
im okay with this

## 2017-11-27 NOTE — Telephone Encounter (Signed)
Are you ok with this? 

## 2017-11-27 NOTE — Telephone Encounter (Signed)
I can take care of this if provider approves.

## 2017-12-01 ENCOUNTER — Other Ambulatory Visit: Payer: Self-pay | Admitting: Nurse Practitioner

## 2017-12-03 NOTE — Telephone Encounter (Signed)
Letter has been mailed, our mail room is slower, it can take a little while.

## 2017-12-03 NOTE — Telephone Encounter (Signed)
Patient is calling and states she has not received the letter in the mail yet. Please advise.

## 2017-12-13 ENCOUNTER — Other Ambulatory Visit: Payer: Self-pay | Admitting: Internal Medicine

## 2017-12-13 ENCOUNTER — Other Ambulatory Visit: Payer: Self-pay | Admitting: Nurse Practitioner

## 2017-12-13 DIAGNOSIS — F4323 Adjustment disorder with mixed anxiety and depressed mood: Secondary | ICD-10-CM

## 2017-12-18 ENCOUNTER — Other Ambulatory Visit: Payer: Self-pay | Admitting: Nurse Practitioner

## 2017-12-18 DIAGNOSIS — I872 Venous insufficiency (chronic) (peripheral): Secondary | ICD-10-CM

## 2017-12-18 NOTE — Telephone Encounter (Signed)
Spoke with pt and informed of rx refill sent.

## 2017-12-18 NOTE — Telephone Encounter (Signed)
AS- Rx refill requested. Thanks -TLG

## 2017-12-22 ENCOUNTER — Ambulatory Visit: Payer: Medicare Other | Admitting: Nurse Practitioner

## 2017-12-22 ENCOUNTER — Telehealth: Payer: Self-pay

## 2017-12-22 DIAGNOSIS — R51 Headache: Principal | ICD-10-CM

## 2017-12-22 DIAGNOSIS — R519 Headache, unspecified: Secondary | ICD-10-CM

## 2017-12-22 DIAGNOSIS — H81399 Other peripheral vertigo, unspecified ear: Secondary | ICD-10-CM

## 2017-12-22 NOTE — Telephone Encounter (Signed)
Copied from [place] [id number]. Topic: Referral - Request >> Dec 22, 2017  1:19 PM Arletha Grippe wrote: Reason for CRM: pt is requesting a ct of her head.  She has been having headaches with pain behind her eyes and around her head.  Pt is scheduled for other parts of her body ct on 12/30/17 and would like it to be on the same day.   Cb is [phone]   >> Dec 22, 2017  1:46 PM Morphies, Isidoro Donning wrote: Is this something we can do? Please advise.

## 2017-12-23 NOTE — Telephone Encounter (Signed)
I have ordered the head CT and asked the CT office to add this test on to the day that she is getting the other CTs done if possible

## 2017-12-23 NOTE — Telephone Encounter (Signed)
Pts daughter is aware of scan being put in.

## 2017-12-29 ENCOUNTER — Other Ambulatory Visit: Payer: Self-pay

## 2017-12-29 DIAGNOSIS — C3400 Malignant neoplasm of unspecified main bronchus: Secondary | ICD-10-CM

## 2017-12-30 ENCOUNTER — Telehealth: Payer: Self-pay | Admitting: Hematology and Oncology

## 2017-12-30 ENCOUNTER — Ambulatory Visit: Payer: Medicare Other | Admitting: Hematology and Oncology

## 2017-12-30 ENCOUNTER — Inpatient Hospital Stay: Payer: Medicare Other | Attending: Hematology and Oncology

## 2017-12-30 ENCOUNTER — Other Ambulatory Visit: Payer: Medicare Other

## 2017-12-30 ENCOUNTER — Inpatient Hospital Stay (HOSPITAL_BASED_OUTPATIENT_CLINIC_OR_DEPARTMENT_OTHER): Payer: Medicare Other | Admitting: Hematology and Oncology

## 2017-12-30 ENCOUNTER — Ambulatory Visit (HOSPITAL_COMMUNITY)
Admission: RE | Admit: 2017-12-30 | Discharge: 2017-12-30 | Disposition: A | Discharge status: Home / Self Care | Payer: Medicare Other | Source: Ambulatory Visit | Attending: Hematology and Oncology | Admitting: Hematology and Oncology

## 2017-12-30 DIAGNOSIS — Z79899 Other long term (current) drug therapy: Secondary | ICD-10-CM | POA: Insufficient documentation

## 2017-12-30 DIAGNOSIS — C210 Malignant neoplasm of anus, unspecified: Secondary | ICD-10-CM

## 2017-12-30 DIAGNOSIS — R519 Headache, unspecified: Secondary | ICD-10-CM

## 2017-12-30 DIAGNOSIS — R911 Solitary pulmonary nodule: Secondary | ICD-10-CM | POA: Insufficient documentation

## 2017-12-30 DIAGNOSIS — R42 Dizziness and giddiness: Secondary | ICD-10-CM | POA: Diagnosis not present

## 2017-12-30 DIAGNOSIS — I700 Atherosclerosis of aorta: Secondary | ICD-10-CM | POA: Insufficient documentation

## 2017-12-30 DIAGNOSIS — I7389 Other specified peripheral vascular diseases: Secondary | ICD-10-CM | POA: Insufficient documentation

## 2017-12-30 DIAGNOSIS — D500 Iron deficiency anemia secondary to blood loss (chronic): Secondary | ICD-10-CM | POA: Diagnosis not present

## 2017-12-30 DIAGNOSIS — R140 Abdominal distension (gaseous): Secondary | ICD-10-CM | POA: Diagnosis not present

## 2017-12-30 DIAGNOSIS — R918 Other nonspecific abnormal finding of lung field: Secondary | ICD-10-CM | POA: Insufficient documentation

## 2017-12-30 DIAGNOSIS — R51 Headache: Secondary | ICD-10-CM | POA: Diagnosis not present

## 2017-12-30 DIAGNOSIS — R590 Localized enlarged lymph nodes: Secondary | ICD-10-CM | POA: Diagnosis not present

## 2017-12-30 DIAGNOSIS — H81399 Other peripheral vertigo, unspecified ear: Secondary | ICD-10-CM

## 2017-12-30 DIAGNOSIS — K620 Anal polyp: Secondary | ICD-10-CM | POA: Insufficient documentation

## 2017-12-30 DIAGNOSIS — C3400 Malignant neoplasm of unspecified main bronchus: Secondary | ICD-10-CM

## 2017-12-30 DIAGNOSIS — N281 Cyst of kidney, acquired: Secondary | ICD-10-CM | POA: Diagnosis not present

## 2017-12-30 LAB — CBC WITH DIFFERENTIAL (CANCER CENTER ONLY)
BASOS ABS: 0 10*3/uL (ref 0.0–0.1)
BASOS PCT: 0 %
EOS ABS: 0.2 10*3/uL (ref 0.0–0.5)
EOS PCT: 4 %
HCT: 38.1 % (ref 34.8–46.6)
HEMOGLOBIN: 12 g/dL (ref 11.6–15.9)
Lymphocytes Relative: 24 %
Lymphs Abs: 1.1 10*3/uL (ref 0.9–3.3)
MCH: 27.6 pg (ref 25.1–34.0)
MCHC: 31.5 g/dL (ref 31.5–36.0)
MCV: 87.8 fL (ref 79.5–101.0)
Monocytes Absolute: 0.4 10*3/uL (ref 0.1–0.9)
Monocytes Relative: 8 %
NEUTROS PCT: 64 %
Neutro Abs: 2.9 10*3/uL (ref 1.5–6.5)
PLATELETS: 140 10*3/uL — AB (ref 145–400)
RBC: 4.34 MIL/uL (ref 3.70–5.45)
RDW: 15.6 % — ABNORMAL HIGH (ref 11.2–14.5)
WBC: 4.5 10*3/uL (ref 3.9–10.3)

## 2017-12-30 LAB — CMP (CANCER CENTER ONLY)
ALT: 15 U/L (ref 0–55)
AST: 22 U/L (ref 5–34)
Albumin: 3.9 g/dL (ref 3.5–5.0)
Alkaline Phosphatase: 47 U/L (ref 40–150)
Anion gap: 9 (ref 3–11)
BILIRUBIN TOTAL: 0.4 mg/dL (ref 0.2–1.2)
BUN: 27 mg/dL — AB (ref 7–26)
CALCIUM: 9.3 mg/dL (ref 8.4–10.4)
CHLORIDE: 101 mmol/L (ref 98–109)
CO2: 31 mmol/L — ABNORMAL HIGH (ref 22–29)
CREATININE: 1.43 mg/dL — AB (ref 0.60–1.10)
GFR, EST AFRICAN AMERICAN: 38 mL/min — AB (ref >60)
GFR, EST NON AFRICAN AMERICAN: 33 mL/min — AB (ref >60)
Glucose, Bld: 94 mg/dL (ref 70–140)
Potassium: 4.5 mmol/L (ref 3.5–5.1)
Sodium: 141 mmol/L (ref 136–145)
TOTAL PROTEIN: 6.7 g/dL (ref 6.4–8.3)

## 2017-12-30 MED ORDER — IOPAMIDOL (ISOVUE-300) INJECTION 61%
INTRAVENOUS | Status: AC
  Filled 2017-12-30: qty 100

## 2017-12-30 MED ORDER — IOHEXOL 300 MG/ML  SOLN
75.0000 mL | Freq: Once | INTRAMUSCULAR | Status: AC | PRN
  Administered 2017-12-30: 75 mL via INTRAVENOUS

## 2017-12-30 NOTE — Assessment & Plan Note (Addendum)
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 It is unclear but the inguinal lymphadenopathy and the lung nodulescould be metastatic disease. it could be a stage IIIa versus stage IV.  Treatment summary 08/13/2016-09/25/2016 : XRT to anus S/P diverting colostomy  CT CAP:12/30/2016: Nonspecific wall thickening posteriorly lower left rectum inguinal adenopathy decreased stable bilateral lung nodules favor benign  Severe anemia from GI bleeding: Stopped after discontinuing anticoagulation, IVC filter Plan: CT scans 12/30/2017: Pending  Return to clinic in 1 year for follow-up

## 2017-12-30 NOTE — Telephone Encounter (Signed)
Gave avs and calendar ° °

## 2017-12-30 NOTE — Progress Notes (Addendum)
Patient Care Team: Lance Sell, NP as PCP - General (Nurse Practitioner)  DIAGNOSIS:  Encounter Diagnosis  Name Primary?  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  She is here for annual follow-up.  She reports no new problems or concerns.  She denies any major pain or discomfort in the anus.  She had a sigmoidoscopy which revealed some lymph nodes.  She had CT scans done today.  The results are not out yet.  To my evaluation but does not seem to be any major change in the scan.  REVIEW OF SYSTEMS:   Constitutional: Denies fevers, chills or abnormal weight loss Eyes: Denies blurriness of vision Ears, nose, mouth, throat, and face: Denies mucositis or sore throat Respiratory: Denies cough, dyspnea or wheezes Cardiovascular: Denies palpitation, chest discomfort Gastrointestinal:  Denies nausea, heartburn or change in bowel habits Skin: Denies abnormal skin rashes Lymphatics: Denies new lymphadenopathy or easy bruising Neurological:Denies numbness, tingling or new weaknesses Behavioral/Psych: Mood is stable, no new changes  Extremities: No lower extremity edema Breast:  denies any pain or lumps or nodules in either breasts All other systems were reviewed with the patient and are negative.  I have reviewed the past medical history, past surgical history, social history and family history with the patient and they are unchanged from previous note.  ALLERGIES:   is allergic to penicillins; aleve [naproxen sodium]; antihistamines, chlorpheniramine-type; aspirin; benadryl [diphenhydramine hcl]; codeine; hydrochlorothiazide; rocephin [ceftriaxone sodium in dextrose]; and sulfa antibiotics.  MEDICATIONS:  Current Outpatient Medications  Medication Sig Dispense Refill  . acetaminophen (TYLENOL) 500 MG tablet Take 500 mg by mouth daily as needed for moderate pain.    Marland Kitchen arformoterol (BROVANA) 15 MCG/2ML NEBU Take 2 mLs (15 mcg total) by nebulization 2 (two) times daily. Dx copd: J44.9 120 mL 5  . atorvastatin (LIPITOR) 20 MG tablet Take 1 tablet (20 mg total) by mouth daily. (Patient taking differently: Take 20 mg by mouth at bedtime. ) 90 tablet 1  . Biotin 10000 MCG TABS Take 10,000 mcg by mouth 2 (two) times daily.    . budesonide (PULMICORT) 0.5 MG/2ML nebulizer solution Take 2 mLs (0.5 mg total) by nebulization 2 (two) times daily. Dx code: j44.9 120 mL 5  . Calcium 600-200 MG-UNIT tablet Take 1 tablet by mouth daily.    Marland Kitchen dofetilide (TIKOSYN) 500 MCG capsule Take 1 capsule (500 mcg total) by mouth 2 (two) times daily. 180 capsule 3  . ergocalciferol (VITAMIN D2) 50000 units capsule Take 1 capsule (50,000 Units total) by mouth once a week. 12 capsule 3  . fenofibrate 160 MG tablet Take 1 tablet (160 mg total) by mouth daily. 90 tablet 1  . ferrous sulfate 325 (65 FE) MG EC tablet Take 325 mg by mouth 2 (two) times daily.    . fluticasone (FLONASE) 50 MCG/ACT nasal spray Place 1 spray into both nostrils daily.    . furosemide (LASIX) 20 MG  tablet TAKE 1 TABLET(20 MG) BY MOUTH DAILY 90 tablet 0  . ipratropium-albuterol (DUONEB) 0.5-2.5 (3) MG/3ML SOLN Take 3 mLs by nebulization 2 (two) times daily. 540 mL 0  . levocetirizine (XYZAL) 5 MG tablet Take 5 mg by mouth at bedtime.  6  . magnesium oxide (MAG-OX) 400 (241.3 Mg) MG tablet Take 1 tablet (400 mg total) by mouth daily. Follow-up appt is due for future refills 90 tablet 0  . montelukast (SINGULAIR) 10 MG  tablet Take 1 tablet (10 mg total) by mouth at bedtime. 90 tablet 1  . Ostomy Supplies KIT 8514 is number on bag one piece unit. Colostomy bag and adhesive and alcohol prep, no sting, cannot use regular prep pad. 30 each 11  . pantoprazole (PROTONIX) 40 MG tablet Take 1 tablet (40 mg total) by mouth daily. Follow-up appt is due for future refills 90 tablet 1  . PARoxetine (PAXIL) 20 MG tablet Take 1 tablet (20 mg total) by mouth daily. Need office visit with new pcp for additional refills. 90 tablet 0  . vitamin B-12 (CYANOCOBALAMIN) 1000 MCG tablet Take 1,000 mcg by mouth daily.     No current facility-administered medications for this visit.    Facility-Administered Medications Ordered in Other Visits  Medication Dose Route Frequency Provider Last Rate Last Dose  . sodium phosphate (FLEET) 7-19 GM/118ML enema 2 enema  2 enema Rectal Once Ileana Roup, MD        PHYSICAL EXAMINATION: ECOG PERFORMANCE STATUS: 1 - Symptomatic but completely ambulatory  Vitals:   12/30/17 1323  BP: (!) 127/50  Pulse: (!) 58  Resp: 17  Temp: 98.5 F (36.9 C)  SpO2: 92%   Filed Weights   12/30/17 1323  Weight: 226 lb 8 oz (102.7 kg)    GENERAL:alert, no distress and comfortable SKIN: skin color, texture, turgor are normal, no rashes or significant lesions EYES: normal, Conjunctiva are pink and non-injected, sclera clear OROPHARYNX:no exudate, no erythema and lips, buccal mucosa, and tongue normal  NECK: supple, thyroid normal size, non-tender, without nodularity LYMPH:  no palpable lymphadenopathy in the cervical, axillary or inguinal LUNGS: clear to auscultation and percussion with normal breathing effort HEART: regular rate & rhythm and no murmurs and no lower extremity edema ABDOMEN:abdomen soft, non-tender and normal bowel sounds MUSCULOSKELETAL:no cyanosis of digits and no clubbing  NEURO: alert & oriented x 3 with fluent speech, no focal motor/sensory deficits EXTREMITIES: No lower  extremity edema BREAST: No palpable masses or nodules in either right or left breasts. No palpable axillary supraclavicular or infraclavicular adenopathy no breast tenderness or nipple discharge. (exam performed in the presence of a chaperone)  LABORATORY DATA:  I have reviewed the data as listed CMP Latest Ref Rng & Units 12/30/2017 11/25/2017 05/27/2017  Glucose 70 - 140 mg/dL 94 91 91  BUN 7 - 26 mg/dL 27(H) 26(H) 23  Creatinine 0.60 - 1.10 mg/dL 1.43(H) 1.24(H) 1.10  Sodium 136 - 145 mmol/L 141 143 142  Potassium 3.5 - 5.1 mmol/L 4.5 4.4 4.2  Chloride 98 - 109 mmol/L 101 102 103  CO2 22 - 29 mmol/L 31(H) 34(H) 32  Calcium 8.4 - 10.4 mg/dL 9.3 9.8 9.9  Total Protein 6.4 - 8.3 g/dL 6.7 6.6 6.7  Total Bilirubin 0.2 - 1.2 mg/dL 0.4 0.4 0.4  Alkaline Phos 40 - 150 U/L 47 40 43  AST 5 - 34 U/L 22 20 20   ALT 0 - 55 U/L 15 12 13     Lab Results  Component Value Date   WBC 4.5 12/30/2017   HGB 12.0 12/30/2017   HCT 38.1 12/30/2017   MCV 87.8 12/30/2017   PLT 140 (L) 12/30/2017   NEUTROABS 2.9 12/30/2017    ASSESSMENT & PLAN:  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 It is unclear but the inguinal lymphadenopathy and the lung nodulescould be metastatic disease. it could be a stage IIIa versus stage IV.  Treatment summary 08/13/2016-09/25/2016 : XRT to anus S/P diverting colostomy  CT CAP:12/30/2016: Nonspecific wall thickening posteriorly lower left rectum inguinal adenopathy decreased stable bilateral lung nodules favor benign  Severe anemia from GI bleeding: Stopped after discontinuing anticoagulation Plan: CT scans 12/30/2017: Results pending, to my evaluation I do not see any major changes.  Small inguinal lymph nodes were noted. CT head was also added to her scans because she was having sinus congestion as well as vertigo.  Return to clinic in 1 year for follow-up  Orders Placed  This Encounter  Procedures  . CT Abdomen Pelvis W Contrast    Standing Status:   Future    Standing Expiration Date:   12/31/2018    Order Specific Question:   If indicated for the ordered procedure, I authorize the administration of contrast media per Radiology protocol    Answer:   Yes    Order Specific Question:   Preferred imaging location?    Answer:   [facility]    Order Specific Question:   Is Oral Contrast requested for this exam?    Answer:   Yes, Per Radiology protocol    Order Specific Question:   Call Results- Best Contact Number?    Answer:   Anal cancer unresectable    Order Specific Question:   Radiology Contrast Protocol - do NOT remove file path    Answer:   \\[email or web address]    Order Specific Question:   Reason for Exam additional comments    Answer:   Anal cancer unresectable  . CT Chest W Contrast    Standing Status:   Future    Standing Expiration Date:   12/30/2018    Order Specific Question:   If indicated for the ordered procedure, I authorize the administration of contrast media per Radiology protocol    Answer:   Yes    Order Specific Question:   Preferred imaging location?    Answer:   [facility]    Order Specific Question:   Radiology Contrast Protocol - do NOT remove file path    Answer:   \\[email or web address]    Order Specific Question:   Reason for Exam additional comments    Answer:   Anal cancer   The patient has a good understanding of the overall plan. she agrees with it. she will call with any problems that may develop before the next visit here.   Harriette Ohara, MD 12/30/17  Addendum: The results of CT chest abdomen pelvis came back.  There is no concern for metastatic disease but there is a new lung nodule measuring 1.8 cm and a smaller lung nodule measuring 4 mm.  I discussed with the family that these lung nodules could be related to carcinoid.  They have an appointment  with pulmonary next month.  They will discuss with pulmonology about  these nodules.

## 2017-12-31 ENCOUNTER — Telehealth: Payer: Self-pay

## 2017-12-31 NOTE — Telephone Encounter (Signed)
Patient previous visit was all on same day. Patient stated she needed to come all in one day. Per 5/22 phone que

## 2018-01-07 ENCOUNTER — Other Ambulatory Visit: Payer: Self-pay | Admitting: Internal Medicine

## 2018-01-07 ENCOUNTER — Other Ambulatory Visit: Payer: Self-pay | Admitting: Nurse Practitioner

## 2018-01-07 DIAGNOSIS — E782 Mixed hyperlipidemia: Secondary | ICD-10-CM

## 2018-01-15 ENCOUNTER — Other Ambulatory Visit: Payer: Self-pay | Admitting: Nurse Practitioner

## 2018-01-15 ENCOUNTER — Other Ambulatory Visit: Payer: Self-pay | Admitting: Family Medicine

## 2018-01-15 DIAGNOSIS — E782 Mixed hyperlipidemia: Secondary | ICD-10-CM

## 2018-01-18 IMAGING — DX DG CHEST 1V PORT
1 series · 1 of 1 positions shown · non-contrast
Comparison: Chest radiograph dated 12/10/2016 and CT dated
08/27/2016

CLINICAL DATA: 85-year-old female with shortness of breath. History
of anal cancer and radiation treatment. Prior right middle lobe
surgery for resection of mass/carcinoid.

EXAM:
PORTABLE CHEST 1 VIEW

[chest ap]
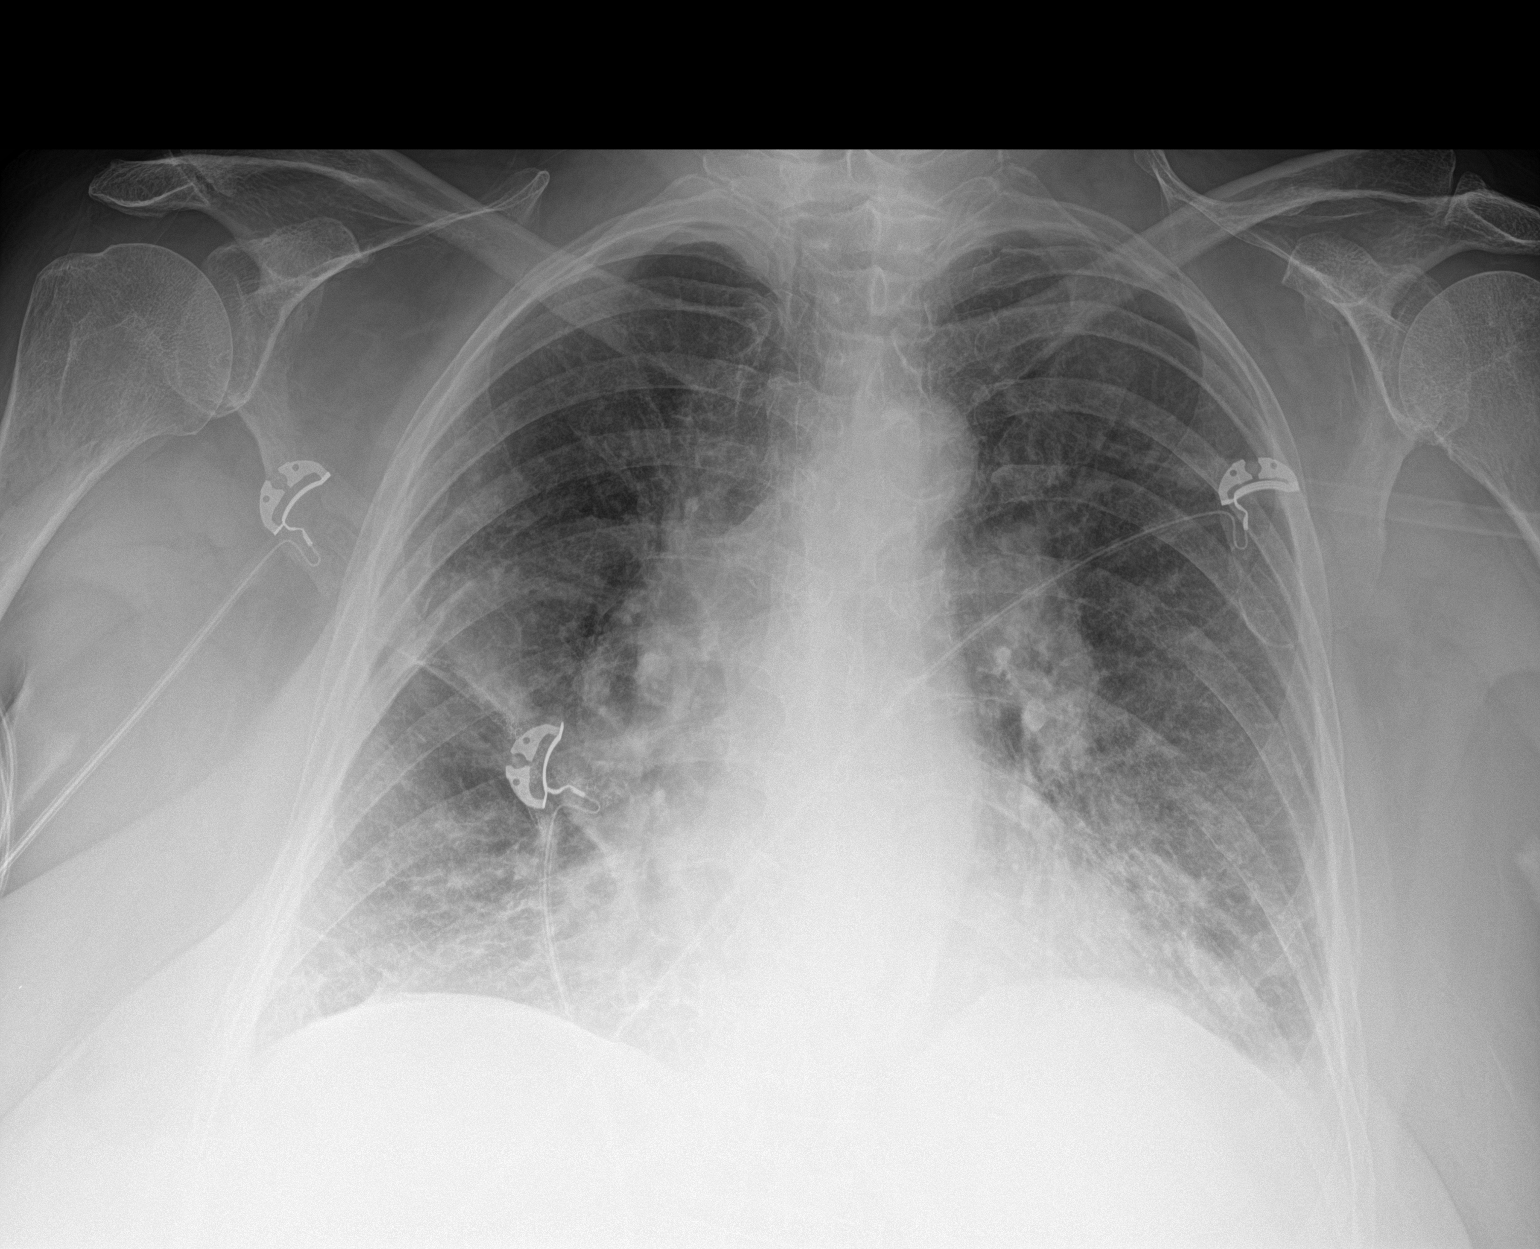

[1 of 1 positions shown; findings below may reference images not displayed]

FINDINGS: Postsurgical changes in the right mid lung field. There diffuse
bilateral interstitial prominence and hazy airspace densities
predominantly involving the mid to lower lung fields. Overall degree
of airspace densities have increased compared to the radiograph of
12/10/2016. Small pleural effusions may be present. There is no
pneumothorax. Stable cardiac silhouette. No acute osseous pathology.
IMPRESSION: Postsurgical changes of the right mid lung field. Bilateral mid to
lower lung field interstitial prominence and airspace densities with
interval progression compared to the prior radiograph noted
concerning for an infectious process. Clinical correlation is
recommended.

## 2018-01-27 ENCOUNTER — Other Ambulatory Visit: Payer: Self-pay | Admitting: Nurse Practitioner

## 2018-01-27 ENCOUNTER — Other Ambulatory Visit: Payer: Self-pay | Admitting: Hematology and Oncology

## 2018-01-27 DIAGNOSIS — J42 Unspecified chronic bronchitis: Secondary | ICD-10-CM

## 2018-02-02 ENCOUNTER — Encounter: Payer: Self-pay | Admitting: Pulmonary Disease

## 2018-02-02 ENCOUNTER — Ambulatory Visit (INDEPENDENT_AMBULATORY_CARE_PROVIDER_SITE_OTHER): Payer: Medicare Other | Admitting: Pulmonary Disease

## 2018-02-02 DIAGNOSIS — R911 Solitary pulmonary nodule: Secondary | ICD-10-CM

## 2018-02-02 NOTE — Progress Notes (Signed)
Tricia Hernandez    [id number]    04/19/1931  Primary Care Physician:Shambley, Delphia Grates, NP  Referring Physician: Lance Sell, NP [address] [place], [place] [id number]  Chief complaint:   Follow up of  COPD Lung nodules. S/p Lung resection for carcinoid-2013 H/o PE, on eliquis  HPI: Tricia Hernandez is a 84 year old with past medical history of lung carcinoid status post right lung resection in 2012 , left upper lobe pulmonary embolism in 2014, on anticoagulation with eliquis. She used to follow Dr. Audie Box, Pulmonologist at [place], [place]. She has recently moved to [facility] and needs to establish pulmonary care here  She has a diagnosis of squamous cell carcinoma of the anus status post diverting colostomy, XRT. Work up included CT scan of the chest and PET scan which shows non avid stable lung nodules. She was recently evaluated for uncontrolled atrial fibrillation and loaded with Tikosyn. She also had an episode of pneumonia in January 2018 for which she was admitted and treated with steroids and antibiotics.   She has a remote smoking history of about 5 pack years and exposure to secondhand smoke. Quit smoking in 1975. 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 She has been exposed to a lot of dust and fibre inhalation during her work in [employer].   Her anticoagulation is being held due to ongoing GI bleed after discussion with Dr. Carlean Purl, GI and Dr. Lindi Adie, Oncology. She has been evaluated by IR for IVC filter. This has been deferred as she did not have any DVT or evidence of hypercoagulability.  Interim history:   Outpatient Encounter Medications as of 02/02/2018  Medication Sig  . acetaminophen (TYLENOL) 500 MG tablet Take 500 mg by mouth daily as needed for moderate pain.  Marland Kitchen arformoterol (BROVANA) 15 MCG/2ML NEBU Take 2 mLs (15 mcg total) by nebulization 2 (two) times daily. Dx copd: J44.9  . atorvastatin (LIPITOR)  20 MG tablet TAKE 1 TABLET(20 MG) BY MOUTH AT BEDTIME  . Biotin 10000 MCG TABS Take 10,000 mcg by mouth 2 (two) times daily.  . budesonide (PULMICORT) 0.5 MG/2ML nebulizer solution Take 2 mLs (0.5 mg total) by nebulization 2 (two) times daily. Dx code: j47.9  . Calcium 600-200 MG-UNIT tablet Take 1 tablet by mouth daily.  Marland Kitchen dofetilide (TIKOSYN) 500 MCG capsule Take 1 capsule (500 mcg total) by mouth 2 (two) times daily.  . fenofibrate 160 MG tablet TAKE 1 TABLET(160 MG) BY MOUTH DAILY  . ferrous sulfate 325 (65 FE) MG EC tablet Take 325 mg by mouth 2 (two) times daily.  . fluticasone (FLONASE) 50 MCG/ACT nasal spray Place 1 spray into both nostrils daily.  . furosemide (LASIX) 20 MG tablet TAKE 1 TABLET(20 MG) BY MOUTH DAILY  . ipratropium-albuterol (DUONEB) 0.5-2.5 (3) MG/3ML SOLN Take 3 mLs by nebulization 2 (two) times daily.  Marland Kitchen levocetirizine (XYZAL) 5 MG tablet Take 5 mg by mouth at bedtime.  Marland Kitchen MAGNESIUM-OXIDE 400 (241.3 Mg) MG tablet TAKE 1 TABLET BY MOUTH ONCE A DAY  . montelukast (SINGULAIR) 10 MG tablet Take 1 tablet (10 mg total) by mouth at bedtime. Overdue for follow-up appt must see provider for future refills  . Ostomy Supplies KIT 8514 is number on bag one piece unit. Colostomy bag and adhesive and alcohol prep, no sting, cannot use regular prep pad.  . pantoprazole (PROTONIX) 40 MG tablet Take 1 tablet (40 mg total) by  mouth daily. Follow-up appt is due for future refills  . PARoxetine (PAXIL) 20 MG tablet Take 1 tablet (20 mg total) by mouth daily. Need office visit with new pcp for additional refills.  . vitamin B-12 (CYANOCOBALAMIN) 1000 MCG tablet Take 1,000 mcg by mouth daily.  . Vitamin D, Ergocalciferol, (DRISDOL) 50000 units CAPS capsule TAKE 1 CAPSULE BY MOUTH 1 TIME A WEEK   Facility-Administered Encounter Medications as of 02/02/2018  Medication  . sodium phosphate (FLEET) 7-19 GM/118ML enema 2 enema    Allergies as of 02/02/2018 - Review Complete 02/02/2018    Allergen Reaction Noted  . Penicillins  06/14/2016  . Aleve [naproxen sodium] Hives 07/22/2016  . Antihistamines, chlorpheniramine-type Rash   . Aspirin Rash 06/14/2016  . Benadryl [diphenhydramine hcl] Palpitations 10/29/2016  . Codeine Rash 06/14/2016  . Hydrochlorothiazide Rash 07/22/2016  . Rocephin [ceftriaxone sodium in dextrose] Rash 06/14/2016  . Sulfa antibiotics Rash 06/14/2016    Past Medical History:  Diagnosis Date  . 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 Anemia   . Anxiety   . Atrial fibrillation ([place])   . Basal cell carcinoma of skin of nose   . CHF (congestive heart failure) ([place]) 11/2015  . Chronic bronchitis ([place])   . Chronic depression   . Colostomy in place [facility]) 07/2016  . Diverticular disease   . Fibrocystic disease of breast   . Fracture of shoulder   . GERD (gastroesophageal reflux disease)   . GI bleed 12/10/2016   in setting of no PPI. 5/2 EGD: grade A reflux esophagitis.  Mild, non-hemorrhagic gastritis.  Ablation of 3 Duodenal AVMs  . Herpes zoster   . History of blood transfusion    for vaginal, uterine bleeding leading to hysterectomy.  in 12/2016 transfused x 1 for GI bleed.   Marland Kitchen History of hiatal hernia   . Hyperlipidemia   . Hypertension   . Labyrinthitis   . Lung cancer ([place])    RML  . Obesity   . Paroxysmal supraventricular tachycardia ([place])   . Peptic ulcer of stomach    hx  . Pneumonia 1980s X 1; 08/2016   "walking; double"  . Pulmonary embolism ([place]) 05/2014   "left lung"  . Vitamin D deficiency     Past Surgical History:  Procedure Laterality Date  . APPENDECTOMY  1959  . BASAL CELL CARCINOMA EXCISION     nose  . BREAST CYST ASPIRATION Right 2016?  Marland Kitchen CATARACT EXTRACTION W/ INTRAOCULAR LENS  IMPLANT, BILATERAL Bilateral 1990s  . CHOLECYSTECTOMY OPEN  1980s  . COLOSTOMY N/A 07/29/2016   Procedure: COLOSTOMY;  Surgeon: Clovis Riley, MD;  Location: [facility];  Service: General;   Laterality: N/A;  . DILATION AND CURETTAGE OF UTERUS    . ESOPHAGOGASTRODUODENOSCOPY N/A 12/11/2016   Procedure: ESOPHAGOGASTRODUODENOSCOPY (EGD);  Surgeon: Jerene Bears, MD;  Location[facility] ENDOSCOPY;  Service: Endoscopy;  Laterality: N/A;  . EVALUATION UNDER ANESTHESIA WITH HEMORRHOIDECTOMY AND PROCTOSCOPY N/A 07/25/2016   Procedure: EXAM UNDER ANESTHESIA, EXCISION PERIANAL MASS.;  Surgeon: Judeth Horn, MD;  Location: [facility];  Service: General;  Laterality: N/A;  Prone position  . FLEXIBLE SIGMOIDOSCOPY N/A 06/16/2017   Procedure: FLEXIBLE SIGMOIDOSCOPY EXAM UNDER ANESHESIA;  Surgeon: Ileana Roup, MD;  Location: [facility] ENDOSCOPY;  Service: General;  Laterality: N/A;  . IR RADIOLOGIST EVAL & MGMT  03/25/2017  . LAPAROSCOPIC DIVERTED COLOSTOMY N/A 07/29/2016   Procedure: ATTEMPTED LAPAROSCOPIC ASSISTED COLOSTOMY;  Surgeon:  Clovis Riley, MD;  Location: [facility];  Service: General;  Laterality: N/A;  . LUNG REMOVAL, PARTIAL Right 2013   RML mass/carcinoid, Dr Lurena Nida  . TONSILLECTOMY AND ADENOIDECTOMY  1960s  . VAGINAL HYSTERECTOMY  1959   "partial"    Family History  Problem Relation Age of Onset  . Heart attack Mother        Dec 1987  . CVA Mother   . Hypertension Mother   . Multiple myeloma Mother   . Breast cancer Sister   . Skin cancer Sister   . Prostate cancer Brother   . Throat cancer Brother   . Cervical cancer Daughter   . Leukemia Daughter   . Leukemia Son   . Throat cancer Brother   . Cancer Brother        soft palette  . Heart Problems Brother        Pacemaker  . Colon cancer Neg Hx   . Pancreatic cancer Neg Hx     Social History   Socioeconomic History  . Marital status: Widowed    Spouse name: Not on file  . Number of children: 5  . Years of education: Not on file  . Highest education level: Not on file  Occupational History  . Occupation: retired  Scientific laboratory technician  . Financial resource strain: Not on file  . Food insecurity:    Worry: Not on file     Inability: Not on file  . Transportation needs:    Medical: Not on file    Non-medical: Not on file  Tobacco Use  . Smoking status: Former Smoker    Packs/day: 0.50    Years: 15.00    Pack years: 7.50    Types: Cigarettes    Last attempt to quit: 1976    Years since quitting: 43.5  . Smokeless tobacco: Never Used  Substance and Sexual Activity  . Alcohol use: No  . Drug use: No  . Sexual activity: Never  Lifestyle  . Physical activity:    Days per week: Not on file    Minutes per session: Not on file  . Stress: Not on file  Relationships  . Social connections:    Talks on phone: Not on file    Gets together: Not on file    Attends religious service: Not on file    Active member of club or organization: Not on file    Attends meetings of clubs or organizations: Not on file    Relationship status: Not on file  . Intimate partner violence:    Fear of current or ex partner: Not on file    Emotionally abused: Not on file    Physically abused: Not on file    Forced sexual activity: Not on file  Other Topics Concern  . Not on file  Social History Narrative  . Not on file   Review of systems: Review of Systems  Constitutional: Negative for fever and chills.  HENT: Negative.   Eyes: Negative for blurred vision.  Respiratory: as per HPI  Cardiovascular: Negative for chest pain and palpitations.  Gastrointestinal: Negative for vomiting, diarrhea, blood per rectum. Genitourinary: Negative for dysuria, urgency, frequency and hematuria.  Musculoskeletal: Negative for myalgias, back pain and joint pain.  Skin: Negative for itching and rash.  Neurological: Negative for dizziness, tremors, focal weakness, seizures and loss of consciousness.  Endo/Heme/Allergies: Negative for environmental allergies.  Psychiatric/Behavioral: Negative for depression, suicidal ideas and hallucinations.  All other systems reviewed and  are negative.  Physical Exam: Blood pressure 138/80, pulse 60,  height 5' 4.25" (1.632 m), weight 226 lb 9.6 oz (102.8 kg), SpO2 96 %. Gen:      No acute distress HEENT:  EOMI, sclera anicteric Neck:     No masses; no thyromegaly Lungs:    Clear to auscultation bilaterally; normal respiratory effort CV:         Regular rate and rhythm; no murmurs Abd:      + bowel sounds; soft, non-tender; no palpable masses, no distension Ext:    No edema; adequate peripheral perfusion Skin:      Warm and dry; no rash Neuro: alert and oriented x 3 Psych: normal mood and affect  Data Reviewed: CT chest 07/25/16-postsurgical changes in right middle lung, multiple subcentimeter pulmonary nodules. CTA chest 08/27/16-no pulmonary embolus, stable postsurgical changes, multiple subcentimeter pulmonary nodules. Consolidative changes in bilateral lungs, small bilateral pleural effusions. PET scan 09/16/16- resolution of consolidative changes, stable postsurgical changes, stable subcentimeter pulmonary nodules which are non-PET avid CT chest abdomen pelvis 12/30/2017- no evidence of recurrent metastatic disease in the abdomen or pelvis.  New pulmonary nodules in the right lower lobe.  Previously visualized pulmonary nodules are stable. I have reviewed all the images personally  PFTs 02/26/27 FVC 1.43 [58%), FEV1 0.82 (45%), F/F 57, TLC 293%, RV/TLC 188% Severe obstruction with bronchodilator response, hyperinflation  Alpha-1 antitrypsin 05/27/2017-141, PI MM  Assessment:  Severe COPD PFTs show severe obstruction with hyperinflation and bronchodilator response (as shown by improvement in FVC].  Stable on Brovana, Pulmicort.  Continue duo nebs as needed Continue supplemental oxygen during the nighttime.  Pulmonary nodules H/O lung carcinoid s/p resection Lung nodules are stable from December 2017 to February 2018 and are non-PET avid which is reassuring. These are likely benign processes or possibly carcinoid.   Last surveillance CT from 5/21 noted with new pulmonary nodules  in the right lower lobe.  We will get a PET scan for further evaluation.   Plan/Recommendations: - Continue brovana, pulmocort, duonebs - PET scan  Marshell Garfinkel MD [facility] 02/02/2018, 4:03 PM  CC: Lance Sell, NP

## 2018-02-02 NOTE — Patient Instructions (Signed)
We will order a follow-up PET/CT to reevaluate your lung nodules.  We will schedule this for sometime in August Follow-up in clinic after PET scan for review. I am glad you are doing well with regard to your breathing.  Continue the Brovana and Pulmicort nebs

## 2018-02-04 ENCOUNTER — Telehealth: Payer: Self-pay | Admitting: Pulmonary Disease

## 2018-02-04 NOTE — Telephone Encounter (Signed)
I have made Dr. Vaughan Browner aware of below message. Nothing further is needed.

## 2018-02-04 NOTE — Telephone Encounter (Signed)
Called [facility] and requested super D from 12/30/17 CT. Advised that someone would call our office back.

## 2018-02-04 NOTE — Telephone Encounter (Signed)
Estill Bamberg, [facility], returned call. Per Estill Bamberg, they are unable to make this.

## 2018-02-08 ENCOUNTER — Other Ambulatory Visit: Payer: Self-pay | Admitting: Nurse Practitioner

## 2018-02-08 DIAGNOSIS — K219 Gastro-esophageal reflux disease without esophagitis: Secondary | ICD-10-CM

## 2018-02-10 ENCOUNTER — Encounter (HOSPITAL_COMMUNITY)
Admission: RE | Admit: 2018-02-10 | Discharge: 2018-02-10 | Disposition: A | Discharge status: Home / Self Care | Payer: Medicare Other | Source: Ambulatory Visit | Attending: Pulmonary Disease | Admitting: Pulmonary Disease

## 2018-02-10 DIAGNOSIS — R911 Solitary pulmonary nodule: Secondary | ICD-10-CM | POA: Diagnosis not present

## 2018-02-10 DIAGNOSIS — C210 Malignant neoplasm of anus, unspecified: Secondary | ICD-10-CM | POA: Diagnosis not present

## 2018-02-10 LAB — GLUCOSE, CAPILLARY: GLUCOSE-CAPILLARY: 88 mg/dL (ref 70–99)

## 2018-02-10 MED ORDER — FLUDEOXYGLUCOSE F - 18 (FDG) INJECTION
11.5000 | Freq: Once | INTRAVENOUS | Status: AC
  Administered 2018-02-10: 11.5 via INTRAVENOUS

## 2018-02-11 ENCOUNTER — Other Ambulatory Visit: Payer: Self-pay | Admitting: *Deleted

## 2018-02-11 DIAGNOSIS — R918 Other nonspecific abnormal finding of lung field: Secondary | ICD-10-CM

## 2018-02-11 DIAGNOSIS — R911 Solitary pulmonary nodule: Secondary | ICD-10-CM

## 2018-02-17 NOTE — Progress Notes (Signed)
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    Nausea/ Vomiting:No Diarrhea:None,having normal bowel movements via colostomy bag  two times daily Skin irritation:Skin normal pink healthy tissue around the stoma site. Fatigue:Yes Using O 2 2L/M nasal at night. SOB with exertion using inhalers per order and nebulizer Weight: Wt Readings from Last 3 Encounters:  02/24/18 231 lb 12.8 oz (105.1 kg)  02/02/18 229 lb (103.9 kg)  12/30/17 226 lb 8 oz (102.7 kg)  BP (!) 155/42 (BP Location: Right Arm, Patient Position: Sitting, Cuff Size: Normal)   Pulse (!) 42   Temp 98.4 F (36.9 C) (Oral)   Resp 20   Ht 5\' 4"  (1.626 m)   Wt 231 lb 12.8 oz (105.1 kg)   SpO2 95% Comment: Uses O2 2L/M at night  BMI 39.79 kg/m

## 2018-02-24 ENCOUNTER — Encounter: Payer: Self-pay | Admitting: Urology

## 2018-02-24 ENCOUNTER — Ambulatory Visit
Admission: RE | Admit: 2018-02-24 | Discharge: 2018-02-24 | Disposition: A | Discharge status: Home / Self Care | Payer: Medicare Other | Source: Ambulatory Visit | Attending: Urology | Admitting: Urology

## 2018-02-24 VITALS — BP 155/42 | HR 42 | Temp 98.4°F | Resp 20 | Ht 64.0 in | Wt 231.8 lb

## 2018-02-24 DIAGNOSIS — Z79899 Other long term (current) drug therapy: Secondary | ICD-10-CM | POA: Diagnosis not present

## 2018-02-24 DIAGNOSIS — R918 Other nonspecific abnormal finding of lung field: Secondary | ICD-10-CM

## 2018-02-24 DIAGNOSIS — Z923 Personal history of irradiation: Secondary | ICD-10-CM | POA: Insufficient documentation

## 2018-02-24 DIAGNOSIS — C210 Malignant neoplasm of anus, unspecified: Secondary | ICD-10-CM | POA: Insufficient documentation

## 2018-02-24 DIAGNOSIS — Z933 Colostomy status: Secondary | ICD-10-CM | POA: Diagnosis not present

## 2018-02-24 DIAGNOSIS — D500 Iron deficiency anemia secondary to blood loss (chronic): Secondary | ICD-10-CM | POA: Insufficient documentation

## 2018-02-24 DIAGNOSIS — C211 Malignant neoplasm of anal canal: Secondary | ICD-10-CM

## 2018-02-24 DIAGNOSIS — Z08 Encounter for follow-up examination after completed treatment for malignant neoplasm: Secondary | ICD-10-CM | POA: Diagnosis not present

## 2018-02-24 NOTE — Progress Notes (Signed)
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 She is accomanied by her daughter, Tricia Hernandez.  She reports that overall, she is doing well and has recovered well from her previous radiotherapy.  Currently, she is without complaints.  She specifically denies blood per rectum, blood in her stools in colostomy bag, rectal pain or abdominal pain. She will occasionally have some clear jelly seepage from the rectum, unchanged recently.  She continues with a healthy appetite and denies N/V or diarrhea.  She has recently lost weight but by intentional means with watching her diet and limiting the amounts of breads and sweets that she eats.              Follow up CT C/A/P on 12/30/17 suggested stable disease with nonspecific mild circumferential wall thickening in the lower rectum without discrete recurrent anorectal mass, presumably representing post treatment change/treated tumor and no evidence of recurrent metastatic disease in the abdomen or pelvis. There was continued reduction in the previously described mild right inguinal lymphadenopathy, now below size criteria for adenopathy.  The previously visualized numerous pulmonary nodules are stable, however, there were two new solid pulmonary nodules, largest is an irregular solid 1.8 x 1.3 cm peripheral right lower lobe pulmonary nodule and a 93m subpleural posterior left upper lobe nodule, that are nonspecific, with new pulmonary metastases not excluded.    She had further evaluation  of the new lung nodules with PET on 02/10/18 which demonstrated persistent right lung pulmonary nodules, most of which are sub-solid.  There was a mildly hypermetabolic 9 mm nodule in the right lower lobe with SUV max of 4.2 and concerning for neoplasm.  Recommendation is for close surveillance.  There were no enlarged or hypermetabolic mediastinal or hilar lymph nodes and no persistent or residual hypermetabolism in the pelvic, abdominal or anorectal area.   She is followed with Dr. GLindi Adiein medical oncology who has recommended continued observation with annual CT chest/abdomen/pelvis and follow up visit thereafter to review results. She has a scheduled follow up appointment with Tricia Hernandez 12/2018.  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  She had recent sigmoidoscopy with Tricia Hernandez Feb. 2019 which was reportedly normal.  She see's him annually for sigmoidoscopy.  She is followed by Dr. MVaughan Brownerin Pulmonology for history of lung carcinoid- s/p right lung resection in 2012.  She was recently seen on 02/02/18 regarding new pulmonary nodules.  He ordered the PET scan which showed only mild hypermetabolism in the right lower lobe nodule and did not show any disease progression as compared to prior CT Chest from 12/2017 so he plans to see her back following a repeat CT Chest in 05/2018.      On review of systems, the  patient states that she is doing well overall and has recovered well from the effects of radiotherapy. Her fatigue is manageable and she continues to slowly regain her strength.  She has a colostomy bag in place which continues to drain normal/loose stool with no blood noted.  They are changing the colostomy bag regularly and deny any skin breakdown or irritation  at the colostomy site.   She has not had any rectal pain. She denies fever, chills, night sweats, chest pain, increased shortness of breath, nausea or vomiting.  Her appetite is good and she is maintaining her weight.  ALLERGIES:  is allergic to penicillins; aleve [naproxen sodium]; antihistamines, chlorpheniramine-type; aspirin; benadryl [diphenhydramine hcl]; codeine; hydrochlorothiazide; rocephin [ceftriaxone sodium in dextrose]; and sulfa antibiotics.  Meds: Current Outpatient Medications  Medication Sig Dispense Refill  . acetaminophen (TYLENOL) 500 MG tablet Take 500 mg by mouth daily as needed for moderate pain.    Marland Kitchen arformoterol (BROVANA) 15 MCG/2ML NEBU Take 2 mLs (15 mcg total) by nebulization 2 (two) times daily. Dx copd: J44.9 120 mL 5  . atorvastatin (LIPITOR) 20 MG tablet TAKE 1 TABLET(20 MG) BY MOUTH AT BEDTIME 90 tablet 0  . Biotin 10000 MCG TABS Take 10,000 mcg by mouth 2 (two) times daily.    . budesonide (PULMICORT) 0.5 MG/2ML nebulizer solution Take 2 mLs (0.5 mg total) by nebulization 2 (two) times daily. Dx code: j44.9 120 mL 5  . Calcium 600-200 MG-UNIT tablet Take 1 tablet by mouth daily.    Marland Kitchen dofetilide (TIKOSYN) 500 MCG capsule Take 1 capsule (500 mcg total) by mouth 2 (two) times daily. 180 capsule 3  . fenofibrate 160 MG tablet TAKE 1 TABLET(160 MG) BY MOUTH DAILY 90 tablet 0  . ferrous sulfate 325 (65 FE) MG EC tablet Take 325 mg by mouth 2 (two) times daily.    . fluticasone (FLONASE) 50 MCG/ACT nasal spray Place 1 spray into both nostrils daily.    . furosemide (LASIX) 20 MG tablet TAKE 1 TABLET(20 MG) BY MOUTH DAILY 90 tablet 0  . ipratropium-albuterol (DUONEB) 0.5-2.5 (3) MG/3ML SOLN Take 3 mLs by nebulization 2 (two) times daily. 540 mL 0  . levocetirizine (XYZAL) 5 MG tablet Take 5 mg by mouth at bedtime.  6  . MAGNESIUM-OXIDE 400 (241.3 Mg) MG tablet TAKE 1 TABLET BY MOUTH ONCE A DAY 90 tablet 3  . montelukast (SINGULAIR) 10 MG tablet Take 1 tablet (10 mg  total) by mouth at bedtime. Overdue for follow-up appt must see provider for future refills 30 tablet 0  . Ostomy Supplies KIT 8514 is number on bag one piece unit. Colostomy bag and adhesive and alcohol prep, no sting, cannot use regular prep pad. 30 each 11  . pantoprazole (PROTONIX) 40 MG tablet Take 1 tablet (40 mg total) by mouth daily. Follow-up appt is due for future refills 90 tablet 1  . PARoxetine (PAXIL) 20 MG tablet Take 1 tablet (20 mg total) by mouth daily. Need office visit with new pcp for additional refills. 90 tablet 0  . vitamin B-12 (CYANOCOBALAMIN) 1000 MCG tablet Take 1,000 mcg by mouth daily.    . Vitamin D, Ergocalciferol, (DRISDOL) 50000 units CAPS capsule TAKE 1 CAPSULE BY MOUTH 1 TIME A WEEK 12 capsule 0   No current facility-administered medications for this encounter.    Facility-Administered Medications Ordered in Other Encounters  Medication Dose Route Frequency Provider Last Rate Last Dose  . sodium phosphate (FLEET) 7-19 GM/118ML enema 2 enema  2 enema  Rectal Once Ileana Roup, MD        Physical Findings:  height is 5' 4"  (1.626 m) and weight is 231 lb 12.8 oz (105.1 kg). Her oral temperature is 98.4 F (36.9 C). Her blood pressure is 155/42 (abnormal) and her pulse is 42 (abnormal). Her respiration is 20 and oxygen saturation is 95%.  Pain Assessment Pain Score: 0-No pain0/10 In general this is a well appearing, obese caucasian female in no acute distress. She's alert and oriented x4 and appropriate throughout the examination. Cardiopulmonary assessment is negative for acute distress and she exhibits normal effort.  There is no palpable inguinal lymphadenopathy.  Abdomen is soft, non-tender and non-distended. The colostomy site is clean and without signs of infection.  Colostomy is draining appropriately with dark brown stool present.  The skin on her buttocks is dry and intact. There is no rectal drainage, bleeding or mass visualized. Rectal exam is  performed and is without palpable rectal mass.  Patient declined vaginal exam today but has this annually with her GYN.  Lab Findings: Lab Results  Component Value Date   WBC 4.5 12/30/2017   HGB 12.0 12/30/2017   HCT 38.1 12/30/2017   MCV 87.8 12/30/2017   PLT 140 (L) 12/30/2017     Radiographic Findings: Nm Pet Image Initial (pi) Skull Base To Thigh  Result Date: 02/10/2018 CLINICAL DATA:  Subsequent treatment strategy for metastatic anal carcinoma. New lung nodules. EXAM: NUCLEAR MEDICINE PET SKULL BASE TO THIGH TECHNIQUE: 11.5 mCi F-18 FDG was injected intravenously. Full-ring PET imaging was performed from the skull base to thigh after the radiotracer. CT data was obtained and used for attenuation correction and anatomic localization. Fasting blood glucose: 88 mg/dl COMPARISON:  PET-CT 09/16/2016 and chest CT 12/30/2017 FINDINGS: Mediastinal blood pool activity: SUV max 3.72 NECK: No hypermetabolic lymph nodes in the neck. Incidental CT findings: none CHEST: Several vague sub solid appearing nodular densities in the right lung persist. The largest nodule on the CT scan in the right lower lobe on image number 24 is not hypermetabolic. Slightly more inferiorly in the right lower lobe on image number 26 is a more solid appearing nodule measuring 11 mm. SUV max is 1.7 which is well below mediastinal background. Sub solid nodule measuring 9 mm on image number 44 in the right lower lobe is mildly hypermetabolic with SUV max of 4.2. This is worrisome for neoplasm. Other small bilateral pulmonary nodules are stable. No enlarged or hypermetabolic mediastinal or hilar lymph nodes. Incidental CT findings: none ABDOMEN/PELVIS: No definite hepatic metastatic disease. The adrenal glands appear normal. No mesenteric or retroperitoneal hypermetabolic adenopathy. I do not see any residual hypermetabolism in the anal region. No pelvic adenopathy. Stable left lower quadrant colostomy. Incidental CT findings: none  SKELETON: No focal hypermetabolic activity to suggest skeletal metastasis. Diffuse hypermetabolism seen on the prior study was probably due to chemotherapy or marrow stimulating drugs. Incidental CT findings: none IMPRESSION: 1. Persistent right lung pulmonary nodules most of which are sub solid. 9 mm nodule in the right lower lobe is mildly hypermetabolic with SUV max of 4.2 and worrisome for neoplasm. Recommend close surveillance. 2. No enlarged or hypermetabolic mediastinal or hilar lymph nodes. 3. No persistent or residual hypermetabolism in the anorectal area and no pelvic or abdominal lymphadenopathy. 4. No findings suspicious for hepatic metastatic disease. Electronically Signed   By: Marijo Sanes M.D.   On: 02/10/2018 16:54    Impression/Plan: 1. Stage T3 N3 MX (possible lung metastases) squamous  cell carcinoma of the anal canal - Stage IIIB vs IV.  The patient appears to continue to do well following completion of radiotherapy. She will follow up with Dr. Lindi Hernandez as planned in 12/2018 and annually thereafter, with plans to obtain restaging systemic imaging with CT Chest/Abd/Pelvis annually.  Her next planned Chest CT scan is to occur in October, prior to her follow up appointment with Dr. Vaughan Hernandez.   I have advised that she will need continued evaluation with Dr. Dema Hernandez in general surgery for anoscopy/sigmoidoscopy every 6-12 months.  Her daughter, Tricia Hernandez, will call for this appointment. We will continue to follow her annually for DRE and inguinal lymph node assessment. Since she travels from [place], [place], we will try and coordinate her follow-up appointment in our office on the same date as follow up with medical oncology. In the interim, she knows to contact us with any questions or concerns related to her previous radiotherapy.    Nicholos Johns, PA-C

## 2018-02-28 ENCOUNTER — Other Ambulatory Visit: Payer: Self-pay | Admitting: Family Medicine

## 2018-02-28 ENCOUNTER — Other Ambulatory Visit: Payer: Self-pay | Admitting: Nurse Practitioner

## 2018-02-28 DIAGNOSIS — F4323 Adjustment disorder with mixed anxiety and depressed mood: Secondary | ICD-10-CM

## 2018-02-28 DIAGNOSIS — E782 Mixed hyperlipidemia: Secondary | ICD-10-CM

## 2018-03-02 ENCOUNTER — Other Ambulatory Visit: Payer: Self-pay | Admitting: Family

## 2018-03-02 ENCOUNTER — Other Ambulatory Visit: Payer: Self-pay

## 2018-03-02 DIAGNOSIS — J42 Unspecified chronic bronchitis: Secondary | ICD-10-CM

## 2018-03-02 DIAGNOSIS — E782 Mixed hyperlipidemia: Secondary | ICD-10-CM

## 2018-03-02 MED ORDER — MONTELUKAST SODIUM 10 MG PO TABS: 10.0000 mg | ORAL_TABLET | Freq: Every day | ORAL | 0 refills | Status: DC

## 2018-03-02 NOTE — Telephone Encounter (Signed)
Please see rx refill request. Thanks-TLG 

## 2018-03-03 ENCOUNTER — Telehealth: Payer: Self-pay | Admitting: Family

## 2018-03-03 NOTE — Telephone Encounter (Signed)
Lovena Le,  Can you please get this patient to clarify why she is taking both Lipitor and Fenofibrate? Not usually done anymore. Dr. Rip Harbour name is on the [place]- not [facility]? Who is she seeing?

## 2018-03-09 NOTE — Telephone Encounter (Signed)
LVM for pt to call back in regards.  

## 2018-03-12 ENCOUNTER — Telehealth: Payer: Self-pay | Admitting: Nurse Practitioner

## 2018-03-12 NOTE — Telephone Encounter (Signed)
Spoke with pts daughter Collie Siad and she advised she had no clue why Dr. Deborra Medina was listed on the fenofibrate but she has been taking both lipitor and fenofibrate for years. She has a hx of really high cholesterol. States that it was not controlled with just 1 medication. Hollie Beach is her PCP so she stated that if there are any changes that needed to be made let her know.

## 2018-03-12 NOTE — Telephone Encounter (Signed)
Thanks for the clarification; will keep her on medications as prescribed.

## 2018-03-12 NOTE — Telephone Encounter (Signed)
Copied from [place] [id number]. Topic: General - Other >> Mar 12, 2018  3:38 PM Yvette Rack wrote: Reason for CRM: Pt daughter Collie Siad returned call to the office. Collie Siad states pt is still taking both lipitor and fenofibrate. Collie Siad requests a call back. Cb# [phone]

## 2018-03-13 ENCOUNTER — Telehealth: Payer: Self-pay | Admitting: Internal Medicine

## 2018-03-13 NOTE — Telephone Encounter (Signed)
Spoke with daughter.  Per daughter dizziness has been ongoing for a couple months.  Pt's heart rate has been running in the 40's, only comes up into the 50's with activity.  Pt is only on dofetilide, no other blood pressure medications.  Made appt with Geroge Baseman at [facility] for Monday 03/16/2018 at 1:30 pm for assessment.  Advised Dr. Lovena Le was off, but would follow up after he returns.  Daughter indicates understanding.

## 2018-03-13 NOTE — Telephone Encounter (Signed)
New Message:       STAT if HR is under 50 or over 120 (normal HR is 60-100 beats per minute)  1) What is your heart rate? Low 40's for the last couple of weeks but this morning it was in the 50's after being active  2) Do you have a log of your heart rate readings (document readings)?   3) Do you have any other symptoms? Lightheaded and fatigue

## 2018-03-13 NOTE — Telephone Encounter (Signed)
Spoke with pts daughter in regards.

## 2018-03-16 ENCOUNTER — Encounter (HOSPITAL_COMMUNITY): Payer: Self-pay | Admitting: Nurse Practitioner

## 2018-03-16 ENCOUNTER — Ambulatory Visit (HOSPITAL_COMMUNITY): Admission: RE | Admit: 2018-03-16 | Payer: Medicare Other | Source: Ambulatory Visit | Admitting: Nurse Practitioner

## 2018-03-16 ENCOUNTER — Encounter (HOSPITAL_COMMUNITY): Payer: Self-pay

## 2018-03-16 ENCOUNTER — Ambulatory Visit (HOSPITAL_COMMUNITY)
Admission: RE | Admit: 2018-03-16 | Discharge: 2018-03-16 | Disposition: A | Discharge status: Home / Self Care | Payer: Medicare Other | Source: Ambulatory Visit | Attending: Nurse Practitioner | Admitting: Nurse Practitioner

## 2018-03-16 VITALS — BP 146/68 | HR 58 | Ht 64.0 in | Wt 232.0 lb

## 2018-03-16 DIAGNOSIS — E785 Hyperlipidemia, unspecified: Secondary | ICD-10-CM | POA: Diagnosis not present

## 2018-03-16 DIAGNOSIS — Z807 Family history of other malignant neoplasms of lymphoid, hematopoietic and related tissues: Secondary | ICD-10-CM | POA: Diagnosis not present

## 2018-03-16 DIAGNOSIS — D649 Anemia, unspecified: Secondary | ICD-10-CM | POA: Diagnosis not present

## 2018-03-16 DIAGNOSIS — Z8249 Family history of ischemic heart disease and other diseases of the circulatory system: Secondary | ICD-10-CM | POA: Insufficient documentation

## 2018-03-16 DIAGNOSIS — K219 Gastro-esophageal reflux disease without esophagitis: Secondary | ICD-10-CM | POA: Diagnosis not present

## 2018-03-16 DIAGNOSIS — Z85828 Personal history of other malignant neoplasm of skin: Secondary | ICD-10-CM | POA: Diagnosis not present

## 2018-03-16 DIAGNOSIS — F419 Anxiety disorder, unspecified: Secondary | ICD-10-CM | POA: Diagnosis not present

## 2018-03-16 DIAGNOSIS — Z808 Family history of malignant neoplasm of other organs or systems: Secondary | ICD-10-CM | POA: Insufficient documentation

## 2018-03-16 DIAGNOSIS — Z9841 Cataract extraction status, right eye: Secondary | ICD-10-CM | POA: Insufficient documentation

## 2018-03-16 DIAGNOSIS — Z886 Allergy status to analgesic agent status: Secondary | ICD-10-CM | POA: Insufficient documentation

## 2018-03-16 DIAGNOSIS — Z888 Allergy status to other drugs, medicaments and biological substances status: Secondary | ICD-10-CM | POA: Insufficient documentation

## 2018-03-16 DIAGNOSIS — Z6839 Body mass index (BMI) 39.0-39.9, adult: Secondary | ICD-10-CM | POA: Insufficient documentation

## 2018-03-16 DIAGNOSIS — Z8711 Personal history of peptic ulcer disease: Secondary | ICD-10-CM | POA: Diagnosis not present

## 2018-03-16 DIAGNOSIS — R531 Weakness: Secondary | ICD-10-CM | POA: Diagnosis not present

## 2018-03-16 DIAGNOSIS — E669 Obesity, unspecified: Secondary | ICD-10-CM | POA: Insufficient documentation

## 2018-03-16 DIAGNOSIS — I110 Hypertensive heart disease with heart failure: Secondary | ICD-10-CM | POA: Diagnosis not present

## 2018-03-16 DIAGNOSIS — I509 Heart failure, unspecified: Secondary | ICD-10-CM | POA: Diagnosis not present

## 2018-03-16 DIAGNOSIS — Z881 Allergy status to other antibiotic agents status: Secondary | ICD-10-CM | POA: Insufficient documentation

## 2018-03-16 DIAGNOSIS — R001 Bradycardia, unspecified: Secondary | ICD-10-CM

## 2018-03-16 DIAGNOSIS — Z86711 Personal history of pulmonary embolism: Secondary | ICD-10-CM | POA: Diagnosis not present

## 2018-03-16 DIAGNOSIS — Z7951 Long term (current) use of inhaled steroids: Secondary | ICD-10-CM | POA: Insufficient documentation

## 2018-03-16 DIAGNOSIS — I480 Paroxysmal atrial fibrillation: Secondary | ICD-10-CM

## 2018-03-16 DIAGNOSIS — Z85118 Personal history of other malignant neoplasm of bronchus and lung: Secondary | ICD-10-CM | POA: Insufficient documentation

## 2018-03-16 DIAGNOSIS — Z87891 Personal history of nicotine dependence: Secondary | ICD-10-CM | POA: Diagnosis not present

## 2018-03-16 DIAGNOSIS — Z882 Allergy status to sulfonamides status: Secondary | ICD-10-CM | POA: Insufficient documentation

## 2018-03-16 DIAGNOSIS — Z90710 Acquired absence of both cervix and uterus: Secondary | ICD-10-CM | POA: Diagnosis not present

## 2018-03-16 DIAGNOSIS — Z961 Presence of intraocular lens: Secondary | ICD-10-CM | POA: Insufficient documentation

## 2018-03-16 DIAGNOSIS — Z880 Allergy status to penicillin: Secondary | ICD-10-CM | POA: Insufficient documentation

## 2018-03-16 DIAGNOSIS — Z933 Colostomy status: Secondary | ICD-10-CM | POA: Insufficient documentation

## 2018-03-16 DIAGNOSIS — Z803 Family history of malignant neoplasm of breast: Secondary | ICD-10-CM | POA: Insufficient documentation

## 2018-03-16 DIAGNOSIS — I4891 Unspecified atrial fibrillation: Secondary | ICD-10-CM | POA: Diagnosis not present

## 2018-03-16 DIAGNOSIS — E559 Vitamin D deficiency, unspecified: Secondary | ICD-10-CM | POA: Diagnosis not present

## 2018-03-16 DIAGNOSIS — Z9049 Acquired absence of other specified parts of digestive tract: Secondary | ICD-10-CM | POA: Insufficient documentation

## 2018-03-16 DIAGNOSIS — Z79899 Other long term (current) drug therapy: Secondary | ICD-10-CM | POA: Diagnosis not present

## 2018-03-16 DIAGNOSIS — Z85048 Personal history of other malignant neoplasm of rectum, rectosigmoid junction, and anus: Secondary | ICD-10-CM | POA: Insufficient documentation

## 2018-03-16 DIAGNOSIS — Z885 Allergy status to narcotic agent status: Secondary | ICD-10-CM | POA: Insufficient documentation

## 2018-03-16 DIAGNOSIS — Z806 Family history of leukemia: Secondary | ICD-10-CM | POA: Insufficient documentation

## 2018-03-16 DIAGNOSIS — Z9842 Cataract extraction status, left eye: Secondary | ICD-10-CM | POA: Insufficient documentation

## 2018-03-16 DIAGNOSIS — Z8049 Family history of malignant neoplasm of other genital organs: Secondary | ICD-10-CM | POA: Insufficient documentation

## 2018-03-16 DIAGNOSIS — R42 Dizziness and giddiness: Secondary | ICD-10-CM | POA: Insufficient documentation

## 2018-03-16 DIAGNOSIS — Z823 Family history of stroke: Secondary | ICD-10-CM | POA: Insufficient documentation

## 2018-03-16 NOTE — Telephone Encounter (Signed)
Daughter is aware 

## 2018-03-16 NOTE — Progress Notes (Signed)
Primary Care Physician: Lance Sell, NP Referring Physician:Dr. Shakena Callari is a 84 y.o. female with a h/o afib on Tikosyn in the afib clinic for c/o of feeling lightheaded and having h/o brady. She had a brief episode of syncope in the remote past  and BB was stopped. She has not had any syncope but is noticing HR in the 40's at home and is not currently on any chronotropic drugs with tikosyn.   Today, she denies symptoms of palpitations, chest pain, shortness of breath, orthopnea, PND, lower extremity edema, dizziness, presyncope, syncope, or neurologic sequela. The patient is tolerating medications without difficulties and is otherwise without complaint today.   Past Medical History:  Diagnosis Date  . 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 Anemia   . Anxiety   . Atrial fibrillation ([place])   . Basal cell carcinoma of skin of nose   . CHF (congestive heart failure) ([place]) 11/2015  . Chronic bronchitis ([place])   . Chronic depression   . Colostomy in place [facility]) 07/2016  . Diverticular disease   . Fibrocystic disease of breast   . Fracture of shoulder   . GERD (gastroesophageal reflux disease)   . GI bleed 12/10/2016   in setting of no PPI. 5/2 EGD: grade A reflux esophagitis.  Mild, non-hemorrhagic gastritis.  Ablation of 3 Duodenal AVMs  . Herpes zoster   . History of blood transfusion    for vaginal, uterine bleeding leading to hysterectomy.  in 12/2016 transfused x 1 for GI bleed.   Marland Kitchen History of hiatal hernia   . Hyperlipidemia   . Hypertension   . Labyrinthitis   . Lung cancer ([place])    RML  . Obesity   . Paroxysmal supraventricular tachycardia ([place])   . Peptic ulcer of stomach    hx  . Pneumonia 1980s X 1; 08/2016   "walking; double"  . Pulmonary embolism ([place]) 05/2014   "left lung"  . Vitamin D deficiency    Past Surgical History:  Procedure Laterality Date  . APPENDECTOMY  1959  . BASAL CELL  CARCINOMA EXCISION     nose  . BREAST CYST ASPIRATION Right 2016?  Marland Kitchen CATARACT EXTRACTION W/ INTRAOCULAR LENS  IMPLANT, BILATERAL Bilateral 1990s  . CHOLECYSTECTOMY OPEN  1980s  . COLOSTOMY N/A 07/29/2016   Procedure: COLOSTOMY;  Surgeon: Clovis Riley, MD;  Location: [place];  Service: General;  Laterality: N/A;  . DILATION AND CURETTAGE OF UTERUS    . ESOPHAGOGASTRODUODENOSCOPY N/A 12/11/2016   Procedure: ESOPHAGOGASTRODUODENOSCOPY (EGD);  Surgeon: Jerene Bears, MD;  Location: [facility] ENDOSCOPY;  Service: Endoscopy;  Laterality: N/A;  . EVALUATION UNDER ANESTHESIA WITH HEMORRHOIDECTOMY AND PROCTOSCOPY N/A 07/25/2016   Procedure: EXAM UNDER ANESTHESIA, EXCISION PERIANAL MASS.;  Surgeon: Judeth Horn, MD;  Location: [place];  Service: General;  Laterality: N/A;  Prone position  . FLEXIBLE SIGMOIDOSCOPY N/A 06/16/2017   Procedure: FLEXIBLE SIGMOIDOSCOPY EXAM UNDER ANESHESIA;  Surgeon: Ileana Roup, MD;  Location: [facility] ENDOSCOPY;  Service: General;  Laterality: N/A;  . IR RADIOLOGIST EVAL & MGMT  03/25/2017  . LAPAROSCOPIC DIVERTED COLOSTOMY N/A 07/29/2016   Procedure: ATTEMPTED LAPAROSCOPIC ASSISTED COLOSTOMY;  Surgeon: Clovis Riley, MD;  Location: [place];  Service: General;  Laterality: N/A;  . LUNG REMOVAL, PARTIAL Right 2013   RML mass/carcinoid, Dr Lurena Nida  . TONSILLECTOMY AND ADENOIDECTOMY  1960s  . VAGINAL HYSTERECTOMY  1959   "partial"  Current Outpatient Medications  Medication Sig Dispense Refill  . acetaminophen (TYLENOL) 500 MG tablet Take 500 mg by mouth daily as needed for moderate pain.    Marland Kitchen arformoterol (BROVANA) 15 MCG/2ML NEBU Take 2 mLs (15 mcg total) by nebulization 2 (two) times daily. Dx copd: J44.9 120 mL 5  . atorvastatin (LIPITOR) 20 MG tablet TAKE 1 TABLET(20 MG) BY MOUTH AT BEDTIME 90 tablet 0  . Biotin 10000 MCG TABS Take 10,000 mcg by mouth 2 (two) times daily.    . budesonide (PULMICORT) 0.5 MG/2ML nebulizer solution Take 2 mLs (0.5 mg total) by nebulization 2  (two) times daily. Dx code: j44.9 120 mL 5  . Calcium 600-200 MG-UNIT tablet Take 1 tablet by mouth daily.    Marland Kitchen dofetilide (TIKOSYN) 500 MCG capsule Take 1 capsule (500 mcg total) by mouth 2 (two) times daily. 180 capsule 3  . fenofibrate 160 MG tablet TAKE 1 TABLET(160 MG) BY MOUTH DAILY 90 tablet 0  . ferrous sulfate 325 (65 FE) MG EC tablet Take 325 mg by mouth 2 (two) times daily.    . fluticasone (FLONASE) 50 MCG/ACT nasal spray Place 1 spray into both nostrils daily.    . furosemide (LASIX) 20 MG tablet TAKE 1 TABLET(20 MG) BY MOUTH DAILY 90 tablet 0  . ipratropium-albuterol (DUONEB) 0.5-2.5 (3) MG/3ML SOLN Take 3 mLs by nebulization 2 (two) times daily. 540 mL 0  . levocetirizine (XYZAL) 5 MG tablet Take 5 mg by mouth at bedtime.  6  . MAGNESIUM-OXIDE 400 (241.3 Mg) MG tablet TAKE 1 TABLET BY MOUTH ONCE A DAY 90 tablet 3  . montelukast (SINGULAIR) 10 MG tablet Take 1 tablet (10 mg total) by mouth at bedtime. Overdue for follow-up appt must see provider for future refills 30 tablet 0  . Ostomy Supplies KIT 8514 is number on bag one piece unit. Colostomy bag and adhesive and alcohol prep, no sting, cannot use regular prep pad. 30 each 11  . pantoprazole (PROTONIX) 40 MG tablet Take 1 tablet (40 mg total) by mouth daily. Follow-up appt is due for future refills 90 tablet 1  . PARoxetine (PAXIL) 20 MG tablet TAKE 1 TABLET(20 MG) BY MOUTH DAILY 90 tablet 0  . vitamin B-12 (CYANOCOBALAMIN) 1000 MCG tablet Take 1,000 mcg by mouth daily.    . Vitamin D, Ergocalciferol, (DRISDOL) 50000 units CAPS capsule TAKE 1 CAPSULE BY MOUTH 1 TIME A WEEK 12 capsule 0   No current facility-administered medications for this encounter.    Facility-Administered Medications Ordered in Other Encounters  Medication Dose Route Frequency Provider Last Rate Last Dose  . sodium phosphate (FLEET) 7-19 GM/118ML enema 2 enema  2 enema Rectal Once Ileana Roup, MD        Allergies  Allergen Reactions  .  Penicillins     Reaction:  Unknown  Has patient had a PCN reaction causing immediate rash, facial/tongue/throat swelling, SOB or lightheadedness with hypotension: Unsure Has patient had a PCN reaction causing severe rash involving mucus membranes or skin necrosis: Unsure Has patient had a PCN reaction that required hospitalization Unsure Has patient had a PCN reaction occurring within the last 10 years: Unsure If all of the above answers are "NO", then may proceed with Cephalosporin use.   [name] [Naproxen Sodium] Hives  . Antihistamines, Chlorpheniramine-Type Rash    Reaction:  Unknown   . Aspirin Rash  . Benadryl [Diphenhydramine Hcl] Palpitations    Increases heart rate  . Codeine Rash  . Hydrochlorothiazide  Rash  . Rocephin [Ceftriaxone Sodium In Dextrose] Rash  . Sulfa Antibiotics Rash    Social History   Socioeconomic History  . Marital status: Widowed    Spouse name: Not on file  . Number of children: 5  . Years of education: Not on file  . Highest education level: Not on file  Occupational History  . Occupation: retired  Scientific laboratory technician  . Financial resource strain: Not on file  . Food insecurity:    Worry: Not on file    Inability: Not on file  . Transportation needs:    Medical: Not on file    Non-medical: Not on file  Tobacco Use  . Smoking status: Former Smoker    Packs/day: 0.50    Years: 15.00    Pack years: 7.50    Types: Cigarettes    Last attempt to quit: 1976    Years since quitting: 43.6  . Smokeless tobacco: Never Used  Substance and Sexual Activity  . Alcohol use: No  . Drug use: No  . Sexual activity: Never  Lifestyle  . Physical activity:    Days per week: Not on file    Minutes per session: Not on file  . Stress: Not on file  Relationships  . Social connections:    Talks on phone: Not on file    Gets together: Not on file    Attends religious service: Not on file    Active member of club or organization: Not on file    Attends  meetings of clubs or organizations: Not on file    Relationship status: Not on file  . Intimate partner violence:    Fear of current or ex partner: Not on file    Emotionally abused: Not on file    Physically abused: Not on file    Forced sexual activity: Not on file  Other Topics Concern  . Not on file  Social History Narrative  . Not on file    Family History  Problem Relation Age of Onset  . Heart attack Mother        Dec 1987  . CVA Mother   . Hypertension Mother   . Multiple myeloma Mother   . Breast cancer Sister   . Skin cancer Sister   . Prostate cancer Brother   . Throat cancer Brother   . Cervical cancer Daughter   . Leukemia Daughter   . Leukemia Son   . Throat cancer Brother   . Cancer Brother        soft palette  . Heart Problems Brother        Pacemaker  . Colon cancer Neg Hx   . Pancreatic cancer Neg Hx     ROS- All systems are reviewed and negative except as per the HPI above  Physical Exam: Vitals:   03/16/18 1336  BP: (!) 146/68  Pulse: (!) 58  Weight: 232 lb (105.2 kg)  Height: 5' 4"  (1.626 m)   Wt Readings from Last 3 Encounters:  03/16/18 232 lb (105.2 kg)  02/24/18 231 lb 12.8 oz (105.1 kg)  02/02/18 229 lb (103.9 kg)    Labs: Lab Results  Component Value Date   NA 141 12/30/2017   K 4.5 12/30/2017   CL 101 12/30/2017   CO2 31 (H) 12/30/2017   GLUCOSE 94 12/30/2017   BUN 27 (H) 12/30/2017   CREATININE 1.43 (H) 12/30/2017   CALCIUM 9.3 12/30/2017   PHOS 4.2 09/02/2016   MG 2.0 05/27/2017  Lab Results  Component Value Date   INR 1.34 12/11/2016   Lab Results  Component Value Date   CHOL 151 05/27/2017   HDL 57.20 05/27/2017   LDLCALC 74 05/27/2017   TRIG 99.0 05/27/2017     GEN- The patient is well appearing, alert and oriented x 3 today.   Head- normocephalic, atraumatic Eyes-  Sclera clear, conjunctiva pink Ears- hearing intact Oropharynx- clear Neck- supple, no JVP Lymph- no cervical lymphadenopathy Lungs-  Clear to ausculation bilaterally, normal work of breathing Heart- Regular rate and rhythm, no murmurs, rubs or gallops, PMI not laterally displaced GI- soft, NT, ND, + BS Extremities- no clubbing, cyanosis, or edema MS- no significant deformity or atrophy Skin- no rash or lesion Psych- euthymic mood, full affect Neuro- strength and sensation are intact  EKG-Sinus brady at 58 bpm, pr int 168 ms, qrs int 68 ms, qtc 492 ms Epic records reviewed    Assessment and Plan: 1. Lightheadedness/weakness On Tikosyn and seems to be staying in [place] BB stopped several months back for  a brief syncopal spell Still has brady in the 40's at home, higher in the office today Will place a 2 week Zio monitor to further evaluate for symptomatic bradycardia Will have pt f/u with Dr. Lovena Le,  In 2-3 weeks, as the pt states that he has mentioned PPM in the past may be a possibility at some point  [place]. Zula Hovsepian, [facility] [address] [place], [place] [address] [phone]

## 2018-03-19 ENCOUNTER — Ambulatory Visit (HOSPITAL_COMMUNITY)
Admission: RE | Admit: 2018-03-19 | Discharge: 2018-03-19 | Disposition: A | Discharge status: Home / Self Care | Payer: Medicare Other | Source: Ambulatory Visit | Attending: Nurse Practitioner | Admitting: Nurse Practitioner

## 2018-03-30 ENCOUNTER — Other Ambulatory Visit: Payer: Self-pay | Admitting: *Deleted

## 2018-03-30 DIAGNOSIS — J42 Unspecified chronic bronchitis: Secondary | ICD-10-CM

## 2018-03-30 MED ORDER — MONTELUKAST SODIUM 10 MG PO TABS: 10.0000 mg | ORAL_TABLET | Freq: Every day | ORAL | 0 refills | Status: DC

## 2018-04-01 ENCOUNTER — Telehealth: Payer: Self-pay | Admitting: Cardiology

## 2018-04-01 DIAGNOSIS — R001 Bradycardia, unspecified: Secondary | ICD-10-CM | POA: Diagnosis not present

## 2018-04-01 NOTE — Telephone Encounter (Signed)
Monitoring company called with results  Pt had 2 episodes of pauses > 4 sec.  Strips 8 & 10, SVT at 108 or so --report to follow will send to Roderic Palau.

## 2018-04-10 ENCOUNTER — Ambulatory Visit (INDEPENDENT_AMBULATORY_CARE_PROVIDER_SITE_OTHER): Payer: Medicare Other | Admitting: Internal Medicine

## 2018-04-10 ENCOUNTER — Encounter: Payer: Self-pay | Admitting: Internal Medicine

## 2018-04-10 VITALS — BP 132/78 | HR 54 | Ht 64.0 in | Wt 228.0 lb

## 2018-04-10 DIAGNOSIS — R001 Bradycardia, unspecified: Secondary | ICD-10-CM

## 2018-04-10 DIAGNOSIS — I480 Paroxysmal atrial fibrillation: Secondary | ICD-10-CM

## 2018-04-10 DIAGNOSIS — I10 Essential (primary) hypertension: Secondary | ICD-10-CM

## 2018-04-10 DIAGNOSIS — I495 Sick sinus syndrome: Secondary | ICD-10-CM

## 2018-04-10 NOTE — H&P (View-Only) (Signed)
HPI Tricia Hernandez returns today for follow up of PAF and sinus node dysfunction. She has had progressively symptomatic sinus bradycardia with HR's in the 30's and at times lower. She has not had frank syncope but multiple dizzy spells. No chest pain or sob.  Allergies  Allergen Reactions  . Penicillins     Reaction:  Unknown  Has patient had a PCN reaction causing immediate rash, facial/tongue/throat swelling, SOB or lightheadedness with hypotension: Unsure Has patient had a PCN reaction causing severe rash involving mucus membranes or skin necrosis: Unsure Has patient had a PCN reaction that required hospitalization Unsure Has patient had a PCN reaction occurring within the last 10 years: Unsure If all of the above answers are "NO", then may proceed with Cephalosporin use.   Tricia Hernandez [Naproxen Sodium] Hives  . Antihistamines, Chlorpheniramine-Type Rash    Reaction:  Unknown   . Aspirin Rash  . Benadryl [Diphenhydramine Hcl] Palpitations    Increases heart rate  . Codeine Rash  . Hydrochlorothiazide Rash  . Rocephin [Ceftriaxone Sodium In Dextrose] Rash  . Sulfa Antibiotics Rash    Other reaction(s): Unknown     Current Outpatient Medications  Medication Sig Dispense Refill  . acetaminophen (TYLENOL) 500 MG tablet Take 500 mg by mouth daily as needed for moderate pain.    Marland Kitchen arformoterol (BROVANA) 15 MCG/2ML NEBU Take 2 mLs (15 mcg total) by nebulization 2 (two) times daily. Dx copd: J44.9 120 mL 5  . atorvastatin (LIPITOR) 20 MG tablet TAKE 1 TABLET(20 MG) BY MOUTH AT BEDTIME 90 tablet 0  . Biotin 10000 MCG TABS Take 10,000 mcg by mouth daily.     . budesonide (PULMICORT) 0.5 MG/2ML nebulizer solution Take 2 mLs (0.5 mg total) by nebulization 2 (two) times daily. Dx code: j44.9 120 mL 5  . Calcium 600-200 MG-UNIT tablet Take 1 tablet by mouth daily.    Marland Kitchen dofetilide (TIKOSYN) 500 MCG capsule Take 1 capsule (500 mcg total) by mouth 2 (two) times daily. 180 capsule 3  .  fenofibrate 160 MG tablet TAKE 1 TABLET(160 MG) BY MOUTH DAILY 90 tablet 0  . ferrous sulfate 325 (65 FE) MG EC tablet Take 325 mg by mouth 2 (two) times daily.    . fluticasone (FLONASE) 50 MCG/ACT nasal spray Place 1 spray into both nostrils daily.    . furosemide (LASIX) 20 MG tablet TAKE 1 TABLET(20 MG) BY MOUTH DAILY 90 tablet 0  . ipratropium-albuterol (DUONEB) 0.5-2.5 (3) MG/3ML SOLN Take 3 mLs by nebulization 2 (two) times daily. 540 mL 0  . levocetirizine (XYZAL) 5 MG tablet Take 5 mg by mouth at bedtime.  6  . MAGNESIUM-OXIDE 400 (241.3 Mg) MG tablet TAKE 1 TABLET BY MOUTH ONCE A DAY 90 tablet 3  . montelukast (SINGULAIR) 10 MG tablet Take 1 tablet (10 mg total) by mouth at bedtime. Must see provider for future refills 30 tablet 0  . Ostomy Supplies KIT 8514 is number on bag one piece unit. Colostomy bag and adhesive and alcohol prep, no sting, cannot use regular prep pad. 30 each 11  . pantoprazole (PROTONIX) 40 MG tablet Take 1 tablet (40 mg total) by mouth daily. Follow-up appt is due for future refills 90 tablet 1  . PARoxetine (PAXIL) 20 MG tablet TAKE 1 TABLET(20 MG) BY MOUTH DAILY 90 tablet 0  . vitamin B-12 (CYANOCOBALAMIN) 1000 MCG tablet Take 1,000 mcg by mouth daily.    . Vitamin D, Ergocalciferol, (DRISDOL) 50000 units CAPS  capsule TAKE 1 CAPSULE BY MOUTH 1 TIME A WEEK 12 capsule 0   No current facility-administered medications for this visit.    Facility-Administered Medications Ordered in Other Visits  Medication Dose Route Frequency Provider Last Rate Last Dose  . sodium phosphate (FLEET) 7-19 GM/118ML enema 2 enema  2 enema Rectal Once Ileana Roup, MD         Past Medical History:  Diagnosis Date  . 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 Anemia   . Anxiety   . Atrial fibrillation ([place])   . Basal cell carcinoma of skin of nose   . CHF (congestive heart failure) ([place]) 11/2015  . Chronic bronchitis ([place])   .  Chronic depression   . Colostomy in place [facility]) 07/2016  . Diverticular disease   . Fibrocystic disease of breast   . Fracture of shoulder   . GERD (gastroesophageal reflux disease)   . GI bleed 12/10/2016   in setting of no PPI. 5/2 EGD: grade A reflux esophagitis.  Mild, non-hemorrhagic gastritis.  Ablation of 3 Duodenal AVMs  . Herpes zoster   . History of blood transfusion    for vaginal, uterine bleeding leading to hysterectomy.  in 12/2016 transfused x 1 for GI bleed.   Marland Kitchen History of hiatal hernia   . Hyperlipidemia   . Hypertension   . Labyrinthitis   . Lung cancer ([place])    RML  . Obesity   . Paroxysmal supraventricular tachycardia ([place])   . Peptic ulcer of stomach    hx  . Pneumonia 1980s X 1; 08/2016   "walking; double"  . Pulmonary embolism ([place]) 05/2014   "left lung"  . Vitamin D deficiency     ROS:   All systems reviewed and negative except as noted in the HPI.   Past Surgical History:  Procedure Laterality Date  . APPENDECTOMY  1959  . BASAL CELL CARCINOMA EXCISION     nose  . BREAST CYST ASPIRATION Right 2016?  Marland Kitchen CATARACT EXTRACTION W/ INTRAOCULAR LENS  IMPLANT, BILATERAL Bilateral 1990s  . CHOLECYSTECTOMY OPEN  1980s  . COLOSTOMY N/A 07/29/2016   Procedure: COLOSTOMY;  Surgeon: Clovis Riley, MD;  Location: [place];  Service: General;  Laterality: N/A;  . DILATION AND CURETTAGE OF UTERUS    . ESOPHAGOGASTRODUODENOSCOPY N/A 12/11/2016   Procedure: ESOPHAGOGASTRODUODENOSCOPY (EGD);  Surgeon: Jerene Bears, MD;  Location: [facility] ENDOSCOPY;  Service: Endoscopy;  Laterality: N/A;  . EVALUATION UNDER ANESTHESIA WITH HEMORRHOIDECTOMY AND PROCTOSCOPY N/A 07/25/2016   Procedure: EXAM UNDER ANESTHESIA, EXCISION PERIANAL MASS.;  Surgeon: Judeth Horn, MD;  Location: [place];  Service: General;  Laterality: N/A;  Prone position  . FLEXIBLE SIGMOIDOSCOPY N/A 06/16/2017   Procedure: FLEXIBLE SIGMOIDOSCOPY EXAM UNDER ANESHESIA;  Surgeon: Ileana Roup, MD;  Location: [facility]  ENDOS[facility];  Service: General;  Laterality: N/A;  . IR RADIOLOGIST EVAL & MGMT  03/25/2017  . LAPAROSCOPIC DIVERTED COLOSTOMY N/A 07/29/2016   Procedure: ATTEMPTED LAPAROSCOPIC ASSISTED COLOSTOMY;  Surgeon: Clovis Riley, MD;  Location: [place];  Service: General;  Laterality: N/A;  . LUNG REMOVAL, PARTIAL Right 2013   RML mass/carcinoid, Dr Lurena Nida  . TONSILLECTOMY AND ADENOIDECTOMY  1960s  . VAGINAL HYSTERECTOMY  1959   "partial"     Family History  Problem Relation Age of Onset  . Heart attack Mother        Dec 1987  . CVA Mother   . Hypertension  Mother   . Multiple myeloma Mother   . Breast cancer Sister   . Skin cancer Sister   . Prostate cancer Brother   . Throat cancer Brother   . Cervical cancer Daughter   . Leukemia Daughter   . Leukemia Son   . Throat cancer Brother   . Cancer Brother        soft palette  . Heart Problems Brother        Pacemaker  . Colon cancer Neg Hx   . Pancreatic cancer Neg Hx      Social History   Socioeconomic History  . Marital status: Widowed    Spouse name: Not on file  . Number of children: 5  . Years of education: Not on file  . Highest education level: Not on file  Occupational History  . Occupation: retired  Scientific laboratory technician  . Financial resource strain: Not on file  . Food insecurity:    Worry: Not on file    Inability: Not on file  . Transportation needs:    Medical: Not on file    Non-medical: Not on file  Tobacco Use  . Smoking status: Former Smoker    Packs/day: 0.50    Years: 15.00    Pack years: 7.50    Types: Cigarettes    Last attempt to quit: 1976    Years since quitting: 43.6  . Smokeless tobacco: Never Used  Substance and Sexual Activity  . Alcohol use: No  . Drug use: No  . Sexual activity: Never  Lifestyle  . Physical activity:    Days per week: Not on file    Minutes per session: Not on file  . Stress: Not on file  Relationships  . Social connections:    Talks on phone: Not on file    Gets  together: Not on file    Attends religious service: Not on file    Active member of club or organization: Not on file    Attends meetings of clubs or organizations: Not on file    Relationship status: Not on file  . Intimate partner violence:    Fear of current or ex partner: Not on file    Emotionally abused: Not on file    Physically abused: Not on file    Forced sexual activity: Not on file  Other Topics Concern  . Not on file  Social History Narrative  . Not on file     BP 132/78   Pulse (!) 54   Ht _0  (1.626 m)   Wt 228 lb (103.4 kg)   BMI 39.14 kg/m   Physical Exam:  Well appearing elderly woman, NAD HEENT: Unremarkable Neck:  6 cm JVD, no thyromegally Lymphatics:  No adenopathy Back:  No CVA tenderness Lungs:  Clear with no wheezes HEART:  Regular rate rhythm, no murmurs, no rubs, no clicks Abd:  soft, positive bowel sounds, no organomegally, no rebound, no guarding Ext:  2 plus pulses, no edema, no cyanosis, no clubbing Skin:  No rashes no nodules Neuro:  CN II through XII intact, motor grossly intact  EKG - sb at 54/min  Assess/Plan: 1. Sinus node dysfunction - this has worsened and she has multiple documented daytime episodes of sinus bradycardia with junctional rhythm in the low 30's and even high 20's. I have discussed the indications/risks/benefits/goals/expectations of PPM insertion and she wishes to proceedv. 2. PAF - she has a h/o RVR and we will continue her current meds. 3. Chronic  diastolic heart failure - her symptoms remain class 2. She is quite sedentary however.  4. Obesity - after her PPM is inserted, she will be encouraged to lose weight.   Mikle [email or web address].D.

## 2018-04-10 NOTE — Progress Notes (Signed)
HPI Tricia Hernandez returns today for follow up of PAF and sinus node dysfunction. She has had progressively symptomatic sinus bradycardia with HR's in the 30's and at times lower. She has not had frank syncope but multiple dizzy spells. No chest pain or sob.  Allergies  Allergen Reactions  . Penicillins     Reaction:  Unknown  Has patient had a PCN reaction causing immediate rash, facial/tongue/throat swelling, SOB or lightheadedness with hypotension: Unsure Has patient had a PCN reaction causing severe rash involving mucus membranes or skin necrosis: Unsure Has patient had a PCN reaction that required hospitalization Unsure Has patient had a PCN reaction occurring within the last 10 years: Unsure If all of the above answers are "NO", then may proceed with Cephalosporin use.   [name] [Naproxen Sodium] Hives  . Antihistamines, Chlorpheniramine-Type Rash    Reaction:  Unknown   . Aspirin Rash  . Benadryl [Diphenhydramine Hcl] Palpitations    Increases heart rate  . Codeine Rash  . Hydrochlorothiazide Rash  . Rocephin [Ceftriaxone Sodium In Dextrose] Rash  . Sulfa Antibiotics Rash    Other reaction(s): Unknown     Current Outpatient Medications  Medication Sig Dispense Refill  . acetaminophen (TYLENOL) 500 MG tablet Take 500 mg by mouth daily as needed for moderate pain.    Marland Kitchen arformoterol (BROVANA) 15 MCG/2ML NEBU Take 2 mLs (15 mcg total) by nebulization 2 (two) times daily. Dx copd: J44.9 120 mL 5  . atorvastatin (LIPITOR) 20 MG tablet TAKE 1 TABLET(20 MG) BY MOUTH AT BEDTIME 90 tablet 0  . Biotin 10000 MCG TABS Take 10,000 mcg by mouth daily.     . budesonide (PULMICORT) 0.5 MG/2ML nebulizer solution Take 2 mLs (0.5 mg total) by nebulization 2 (two) times daily. Dx code: j44.9 120 mL 5  . Calcium 600-200 MG-UNIT tablet Take 1 tablet by mouth daily.    Marland Kitchen dofetilide (TIKOSYN) 500 MCG capsule Take 1 capsule (500 mcg total) by mouth 2 (two) times daily. 180 capsule 3  .  fenofibrate 160 MG tablet TAKE 1 TABLET(160 MG) BY MOUTH DAILY 90 tablet 0  . ferrous sulfate 325 (65 FE) MG EC tablet Take 325 mg by mouth 2 (two) times daily.    . fluticasone (FLONASE) 50 MCG/ACT nasal spray Place 1 spray into both nostrils daily.    . furosemide (LASIX) 20 MG tablet TAKE 1 TABLET(20 MG) BY MOUTH DAILY 90 tablet 0  . ipratropium-albuterol (DUONEB) 0.5-2.5 (3) MG/3ML SOLN Take 3 mLs by nebulization 2 (two) times daily. 540 mL 0  . levocetirizine (XYZAL) 5 MG tablet Take 5 mg by mouth at bedtime.  6  . MAGNESIUM-OXIDE 400 (241.3 Mg) MG tablet TAKE 1 TABLET BY MOUTH ONCE A DAY 90 tablet 3  . montelukast (SINGULAIR) 10 MG tablet Take 1 tablet (10 mg total) by mouth at bedtime. Must see provider for future refills 30 tablet 0  . Ostomy Supplies KIT 8514 is number on bag one piece unit. Colostomy bag and adhesive and alcohol prep, no sting, cannot use regular prep pad. 30 each 11  . pantoprazole (PROTONIX) 40 MG tablet Take 1 tablet (40 mg total) by mouth daily. Follow-up appt is due for future refills 90 tablet 1  . PARoxetine (PAXIL) 20 MG tablet TAKE 1 TABLET(20 MG) BY MOUTH DAILY 90 tablet 0  . vitamin B-12 (CYANOCOBALAMIN) 1000 MCG tablet Take 1,000 mcg by mouth daily.    . Vitamin D, Ergocalciferol, (DRISDOL) 50000 units CAPS  capsule TAKE 1 CAPSULE BY MOUTH 1 TIME A WEEK 12 capsule 0   No current facility-administered medications for this visit.    Facility-Administered Medications Ordered in Other Visits  Medication Dose Route Frequency Provider Last Rate Last Dose  . sodium phosphate (FLEET) 7-19 GM/118ML enema 2 enema  2 enema Rectal Once Ileana Roup, MD         Past Medical History:  Diagnosis Date  . 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 Anemia   . Anxiety   . Atrial fibrillation ([place])   . Basal cell carcinoma of skin of nose   . CHF (congestive heart failure) ([place]) 11/2015  . Chronic bronchitis ([place])   .  Chronic depression   . Colostomy in place [facility]) 07/2016  . Diverticular disease   . Fibrocystic disease of breast   . Fracture of shoulder   . GERD (gastroesophageal reflux disease)   . GI bleed 12/10/2016   in setting of no PPI. 5/2 EGD: grade A reflux esophagitis.  Mild, non-hemorrhagic gastritis.  Ablation of 3 Duodenal AVMs  . Herpes zoster   . History of blood transfusion    for vaginal, uterine bleeding leading to hysterectomy.  in 12/2016 transfused x 1 for GI bleed.   Marland Kitchen History of hiatal hernia   . Hyperlipidemia   . Hypertension   . Labyrinthitis   . Lung cancer ([place])    RML  . Obesity   . Paroxysmal supraventricular tachycardia ([place])   . Peptic ulcer of stomach    hx  . Pneumonia 1980s X 1; 08/2016   "walking; double"  . Pulmonary embolism ([place]) 05/2014   "left lung"  . Vitamin D deficiency     ROS:   All systems reviewed and negative except as noted in the HPI.   Past Surgical History:  Procedure Laterality Date  . APPENDECTOMY  1959  . BASAL CELL CARCINOMA EXCISION     nose  . BREAST CYST ASPIRATION Right 2016?  Marland Kitchen CATARACT EXTRACTION W/ INTRAOCULAR LENS  IMPLANT, BILATERAL Bilateral 1990s  . CHOLECYSTECTOMY OPEN  1980s  . COLOSTOMY N/A 07/29/2016   Procedure: COLOSTOMY;  Surgeon: Clovis Riley, MD;  Location: [facility];  Service: General;  Laterality: N/A;  . DILATION AND CURETTAGE OF UTERUS    . ESOPHAGOGASTRODUODENOSCOPY N/A 12/11/2016   Procedure: ESOPHAGOGASTRODUODENOSCOPY (EGD);  Surgeon: Jerene Bears, MD;  Location[facility] ENDOSCOPY;  Service: Endoscopy;  Laterality: N/A;  . EVALUATION UNDER ANESTHESIA WITH HEMORRHOIDECTOMY AND PROCTOSCOPY N/A 07/25/2016   Procedure: EXAM UNDER ANESTHESIA, EXCISION PERIANAL MASS.;  Surgeon: Judeth Horn, MD;  Location: [facility];  Service: General;  Laterality: N/A;  Prone position  . FLEXIBLE SIGMOIDOSCOPY N/A 06/16/2017   Procedure: FLEXIBLE SIGMOIDOSCOPY EXAM UNDER ANESHESIA;  Surgeon: Ileana Roup, MD;  Location: [facility]  ENDOSCOPY;  Service: General;  Laterality: N/A;  . IR RADIOLOGIST EVAL & MGMT  03/25/2017  . LAPAROSCOPIC DIVERTED COLOSTOMY N/A 07/29/2016   Procedure: ATTEMPTED LAPAROSCOPIC ASSISTED COLOSTOMY;  Surgeon: Clovis Riley, MD;  Location: [facility];  Service: General;  Laterality: N/A;  . LUNG REMOVAL, PARTIAL Right 2013   RML mass/carcinoid, Dr Lurena Nida  . TONSILLECTOMY AND ADENOIDECTOMY  1960s  . VAGINAL HYSTERECTOMY  1959   "partial"     Family History  Problem Relation Age of Onset  . Heart attack Mother        Dec 1987  . CVA Mother   . Hypertension  Mother   . Multiple myeloma Mother   . Breast cancer Sister   . Skin cancer Sister   . Prostate cancer Brother   . Throat cancer Brother   . Cervical cancer Daughter   . Leukemia Daughter   . Leukemia Son   . Throat cancer Brother   . Cancer Brother        soft palette  . Heart Problems Brother        Pacemaker  . Colon cancer Neg Hx   . Pancreatic cancer Neg Hx      Social History   Socioeconomic History  . Marital status: Widowed    Spouse name: Not on file  . Number of children: 5  . Years of education: Not on file  . Highest education level: Not on file  Occupational History  . Occupation: retired  Scientific laboratory technician  . Financial resource strain: Not on file  . Food insecurity:    Worry: Not on file    Inability: Not on file  . Transportation needs:    Medical: Not on file    Non-medical: Not on file  Tobacco Use  . Smoking status: Former Smoker    Packs/day: 0.50    Years: 15.00    Pack years: 7.50    Types: Cigarettes    Last attempt to quit: 1976    Years since quitting: 43.6  . Smokeless tobacco: Never Used  Substance and Sexual Activity  . Alcohol use: No  . Drug use: No  . Sexual activity: Never  Lifestyle  . Physical activity:    Days per week: Not on file    Minutes per session: Not on file  . Stress: Not on file  Relationships  . Social connections:    Talks on phone: Not on file    Gets  together: Not on file    Attends religious service: Not on file    Active member of club or organization: Not on file    Attends meetings of clubs or organizations: Not on file    Relationship status: Not on file  . Intimate partner violence:    Fear of current or ex partner: Not on file    Emotionally abused: Not on file    Physically abused: Not on file    Forced sexual activity: Not on file  Other Topics Concern  . Not on file  Social History Narrative  . Not on file     BP 132/78   Pulse (!) 54   Ht _0  (1.626 m)   Wt 228 lb (103.4 kg)   BMI 39.14 kg/m   Physical Exam:  Well appearing elderly woman, NAD HEENT: Unremarkable Neck:  6 cm JVD, no thyromegally Lymphatics:  No adenopathy Back:  No CVA tenderness Lungs:  Clear with no wheezes HEART:  Regular rate rhythm, no murmurs, no rubs, no clicks Abd:  soft, positive bowel sounds, no organomegally, no rebound, no guarding Ext:  2 plus pulses, no edema, no cyanosis, no clubbing Skin:  No rashes no nodules Neuro:  CN II through XII intact, motor grossly intact  EKG - sb at 54/min  Assess/Plan: 1. Sinus node dysfunction - this has worsened and she has multiple documented daytime episodes of sinus bradycardia with junctional rhythm in the low 30's and even high 20's. I have discussed the indications/risks/benefits/goals/expectations of PPM insertion and she wishes to proceedv. 2. PAF - she has a h/o RVR and we will continue her current meds. 3. Chronic  diastolic heart failure - her symptoms remain class 2. She is quite sedentary however.  4. Obesity - after her PPM is inserted, she will be encouraged to lose weight.   Mikle Bosworth.D.

## 2018-04-10 NOTE — Patient Instructions (Addendum)
Medication Instructions:  Your physician recommends that you continue on your current medications as directed. Please refer to the Current Medication list given to you today.  Labwork:  You will get lab work today:  BMP and CBC.  Testing/Procedures: Your physician has recommended that you have a pacemaker inserted. A pacemaker is a small device that is placed under the skin of your chest or abdomen to help control abnormal heart rhythms. This device uses electrical pulses to prompt the heart to beat at a normal rate. Pacemakers are used to treat heart rhythms that are too slow. Wire (leads) are attached to the pacemaker that goes into the chambers of you heart. This is done in the hospital and usually requires and overnight stay. Please see the instruction sheet given to you today for more information.  Follow-Up:  You will follow up with the device clinic 10-14 days after your procedure for a wound check.  You will follow up with Dr. Lovena Le 91 days after your procedure.  Any Other Special Instructions Will Be Listed Below (If Applicable).  Please arrive at the [facility] main entrance of [facility] hospital at:  7:30 am on April 20, 2018  Use the CHG surgical scrub the night before and morning of your procedure.  Follow the instruction sheet.  Do not eat or drink after midnight prior to procedure  On the morning of your procedure take your normal AM medications with a sip of water except for:  LASIX.  Plan for one night stay  You will need someone to drive you home at discharge  If you need a refill on your cardiac medications before your next appointment, please call your pharmacy.   Pacemaker Implantation, Adult Pacemaker implantation is a procedure to place a pacemaker inside your chest. A pacemaker is a small computer that sends electrical signals to the heart and helps your heart beat normally. A pacemaker also stores information about your heart rhythms. You may need  pacemaker implantation if you:  Have a slow heartbeat (bradycardia).  Faint (syncope).  Have shortness of breath (dyspnea) due to heart problems.  The pacemaker attaches to your heart through a wire, called a lead. Sometimes just one lead is needed. Other times, there will be two leads. There are two types of pacemakers:  Transvenous pacemaker. This type is placed under the skin or muscle of your chest. The lead goes through a vein in the chest area to reach the inside of the heart.  Epicardial pacemaker. This type is placed under the skin or muscle of your chest or belly. The lead goes through your chest to the outside of the heart.  Tell a health care provider about:  Any allergies you have.  All medicines you are taking, including vitamins, herbs, eye drops, creams, and over-the-counter medicines.  Any problems you or family members have had with anesthetic medicines.  Any blood or bone disorders you have.  Any surgeries you have had.  Any medical conditions you have.  Whether you are pregnant or may be pregnant. What are the risks? Generally, this is a safe procedure. However, problems may occur, including:  Infection.  Bleeding.  Failure of the pacemaker or the lead.  Collapse of a lung or bleeding into a lung.  Blood clot inside a blood vessel with a lead.  Damage to the heart.  Infection inside the heart (endocarditis).  Allergic reactions to medicines.  What happens before the procedure? Staying hydrated Follow instructions from your health care  provider about hydration, which may include:  Up to 2 hours before the procedure - you may continue to drink clear liquids, such as water, clear fruit juice, black coffee, and plain tea.  Eating and drinking restrictions Follow instructions from your health care provider about eating and drinking, which may include:  8 hours before the procedure - stop eating heavy meals or foods such as meat, fried foods, or  fatty foods.  6 hours before the procedure - stop eating light meals or foods, such as toast or cereal.  6 hours before the procedure - stop drinking milk or drinks that contain milk.  2 hours before the procedure - stop drinking clear liquids.  Medicines  Ask your health care provider about: ? Changing or stopping your regular medicines. This is especially important if you are taking diabetes medicines or blood thinners. ? Taking medicines such as aspirin and ibuprofen. These medicines can thin your blood. Do not take these medicines before your procedure if your health care provider instructs you not to.  You may be given antibiotic medicine to help prevent infection. General instructions  You will have a heart evaluation. This may include an electrocardiogram (ECG), chest X-ray, and heart imaging (echocardiogram,  or echo) tests.  You will have blood tests.  Do not use any products that contain nicotine or tobacco, such as cigarettes and e-cigarettes. If you need help quitting, ask your health care provider.  Plan to have someone take you home from the hospital or clinic.  If you will be going home right after the procedure, plan to have someone with you for 24 hours.  Ask your health care provider how your surgical site will be marked or identified. What happens during the procedure?  To reduce your risk of infection: ? Your health care team will wash or sanitize their hands. ? Your skin will be washed with soap. ? Hair may be removed from the surgical area.  An IV tube will be inserted into one of your veins.  You will be given one or more of the following: ? A medicine to help you relax (sedative). ? A medicine to numb the area (local anesthetic). ? A medicine to make you fall asleep (general anesthetic).  If you are getting a transvenous pacemaker: ? An incision will be made in your upper chest. ? A pocket will be made for the pacemaker. It may be placed under the  skin or between layers of muscle. ? The lead will be inserted into a blood vessel that returns to the heart. ? While X-rays are taken by an imaging machine (fluoroscopy), the lead will be advanced through the vein to the inside of your heart. ? The other end of the lead will be tunneled under the skin and attached to the pacemaker.  If you are getting an epicardial pacemaker: ? An incision will be made near your ribs or breastbone (sternum) for the lead. ? The lead will be attached to the outside of your heart. ? Another incision will be made in your chest or upper belly to create a pocket for the pacemaker. ? The free end of the lead will be tunneled under the skin and attached to the pacemaker.  The transvenous or epicardial pacemaker will be tested. Imaging studies may be done to check the lead position.  The incisions will be closed with stitches (sutures), adhesive strips, or skin glue.  Bandages (dressing) will be placed over the incisions. The procedure may vary among  health care providers and hospitals. What happens after the procedure?  Your blood pressure, heart rate, breathing rate, and blood oxygen level will be monitored until the medicines you were given have worn off.  You will be given antibiotics and pain medicine.  ECG and chest x-rays will be done.  You will wear a continuous type of ECG (Holter monitor) to check your heart rhythm.  Your health care provider willprogram the pacemaker.  Do not drive for 24 hours if you received a sedative. This information is not intended to replace advice given to you by your health care provider. Make sure you discuss any questions you have with your health care provider. Document Released: 07/19/2002 Document Revised: 02/16/2016 Document Reviewed: 01/10/2016 Elsevier Interactive Patient Education  [employer].

## 2018-04-11 LAB — BASIC METABOLIC PANEL
BUN/Creatinine Ratio: 28 (ref 12–28)
BUN: 35 mg/dL — AB (ref 8–27)
CALCIUM: 9.2 mg/dL (ref 8.7–10.3)
CHLORIDE: 100 mmol/L (ref 96–106)
CO2: 30 mmol/L — AB (ref 20–29)
Creatinine, Ser: 1.26 mg/dL — ABNORMAL HIGH (ref 0.57–1.00)
GFR calc non Af Amer: 39 mL/min/{1.73_m2} — ABNORMAL LOW (ref >59)
GFR, EST AFRICAN AMERICAN: 45 mL/min/{1.73_m2} — AB (ref >59)
Glucose: 83 mg/dL (ref 65–99)
Potassium: 5.2 mmol/L (ref 3.5–5.2)
Sodium: 143 mmol/L (ref 134–144)

## 2018-04-11 LAB — CBC WITH DIFFERENTIAL/PLATELET
BASOS ABS: 0 10*3/uL (ref 0.0–0.2)
Basos: 0 %
EOS (ABSOLUTE): 0.3 10*3/uL (ref 0.0–0.4)
Eos: 5 %
HEMOGLOBIN: 11.7 g/dL (ref 11.1–15.9)
Hematocrit: 37.1 % (ref 34.0–46.6)
IMMATURE GRANS (ABS): 0 10*3/uL (ref 0.0–0.1)
IMMATURE GRANULOCYTES: 0 %
LYMPHS: 28 %
Lymphocytes Absolute: 1.5 10*3/uL (ref 0.7–3.1)
MCH: 27.8 pg (ref 26.6–33.0)
MCHC: 31.5 g/dL (ref 31.5–35.7)
MCV: 88 fL (ref 79–97)
MONOCYTES: 8 %
Monocytes Absolute: 0.4 10*3/uL (ref 0.1–0.9)
NEUTROS ABS: 3.1 10*3/uL (ref 1.4–7.0)
NEUTROS PCT: 59 %
PLATELETS: 146 10*3/uL — AB (ref 150–450)
RBC: 4.21 x10E6/uL (ref 3.77–5.28)
RDW: 15 % (ref 12.3–15.4)
WBC: 5.2 10*3/uL (ref 3.4–10.8)

## 2018-04-14 DIAGNOSIS — I495 Sick sinus syndrome: Secondary | ICD-10-CM | POA: Insufficient documentation

## 2018-04-16 NOTE — Addendum Note (Signed)
Addended by: Dolores Lory on: 04/16/2018 03:44 PM   Modules accepted: Orders

## 2018-04-20 ENCOUNTER — Ambulatory Visit (HOSPITAL_COMMUNITY)
Admission: RE | Admit: 2018-04-20 | Discharge: 2018-04-21 | Disposition: A | Discharge status: Home / Self Care | Payer: Medicare Other | Source: Ambulatory Visit | Attending: Internal Medicine | Admitting: Internal Medicine

## 2018-04-20 ENCOUNTER — Encounter (HOSPITAL_COMMUNITY): Payer: Self-pay | Admitting: General Practice

## 2018-04-20 ENCOUNTER — Other Ambulatory Visit: Payer: Self-pay

## 2018-04-20 ENCOUNTER — Ambulatory Visit (HOSPITAL_COMMUNITY)
Admission: RE | Disposition: A | Discharge status: Home / Self Care | Payer: Self-pay | Source: Ambulatory Visit | Attending: Internal Medicine

## 2018-04-20 DIAGNOSIS — E785 Hyperlipidemia, unspecified: Secondary | ICD-10-CM | POA: Diagnosis not present

## 2018-04-20 DIAGNOSIS — I480 Paroxysmal atrial fibrillation: Secondary | ICD-10-CM | POA: Insufficient documentation

## 2018-04-20 DIAGNOSIS — F419 Anxiety disorder, unspecified: Secondary | ICD-10-CM | POA: Insufficient documentation

## 2018-04-20 DIAGNOSIS — Z806 Family history of leukemia: Secondary | ICD-10-CM | POA: Insufficient documentation

## 2018-04-20 DIAGNOSIS — Z823 Family history of stroke: Secondary | ICD-10-CM | POA: Insufficient documentation

## 2018-04-20 DIAGNOSIS — Z8249 Family history of ischemic heart disease and other diseases of the circulatory system: Secondary | ICD-10-CM | POA: Insufficient documentation

## 2018-04-20 DIAGNOSIS — Z881 Allergy status to other antibiotic agents status: Secondary | ICD-10-CM | POA: Insufficient documentation

## 2018-04-20 DIAGNOSIS — Z933 Colostomy status: Secondary | ICD-10-CM | POA: Insufficient documentation

## 2018-04-20 DIAGNOSIS — Z885 Allergy status to narcotic agent status: Secondary | ICD-10-CM | POA: Insufficient documentation

## 2018-04-20 DIAGNOSIS — Z902 Acquired absence of lung [part of]: Secondary | ICD-10-CM | POA: Insufficient documentation

## 2018-04-20 DIAGNOSIS — Z886 Allergy status to analgesic agent status: Secondary | ICD-10-CM | POA: Diagnosis not present

## 2018-04-20 DIAGNOSIS — Z9889 Other specified postprocedural states: Secondary | ICD-10-CM | POA: Insufficient documentation

## 2018-04-20 DIAGNOSIS — Z808 Family history of malignant neoplasm of other organs or systems: Secondary | ICD-10-CM | POA: Insufficient documentation

## 2018-04-20 DIAGNOSIS — I471 Supraventricular tachycardia: Secondary | ICD-10-CM | POA: Insufficient documentation

## 2018-04-20 DIAGNOSIS — Z888 Allergy status to other drugs, medicaments and biological substances status: Secondary | ICD-10-CM | POA: Insufficient documentation

## 2018-04-20 DIAGNOSIS — Z23 Encounter for immunization: Secondary | ICD-10-CM | POA: Insufficient documentation

## 2018-04-20 DIAGNOSIS — Z86711 Personal history of pulmonary embolism: Secondary | ICD-10-CM | POA: Insufficient documentation

## 2018-04-20 DIAGNOSIS — Z8049 Family history of malignant neoplasm of other genital organs: Secondary | ICD-10-CM | POA: Insufficient documentation

## 2018-04-20 DIAGNOSIS — I495 Sick sinus syndrome: Secondary | ICD-10-CM | POA: Diagnosis not present

## 2018-04-20 DIAGNOSIS — Z807 Family history of other malignant neoplasms of lymphoid, hematopoietic and related tissues: Secondary | ICD-10-CM | POA: Insufficient documentation

## 2018-04-20 DIAGNOSIS — Z85118 Personal history of other malignant neoplasm of bronchus and lung: Secondary | ICD-10-CM | POA: Insufficient documentation

## 2018-04-20 DIAGNOSIS — Z9842 Cataract extraction status, left eye: Secondary | ICD-10-CM | POA: Insufficient documentation

## 2018-04-20 DIAGNOSIS — Z6839 Body mass index (BMI) 39.0-39.9, adult: Secondary | ICD-10-CM | POA: Diagnosis not present

## 2018-04-20 DIAGNOSIS — Z882 Allergy status to sulfonamides status: Secondary | ICD-10-CM | POA: Insufficient documentation

## 2018-04-20 DIAGNOSIS — Z7951 Long term (current) use of inhaled steroids: Secondary | ICD-10-CM | POA: Diagnosis not present

## 2018-04-20 DIAGNOSIS — Z9049 Acquired absence of other specified parts of digestive tract: Secondary | ICD-10-CM | POA: Insufficient documentation

## 2018-04-20 DIAGNOSIS — Z880 Allergy status to penicillin: Secondary | ICD-10-CM | POA: Diagnosis not present

## 2018-04-20 DIAGNOSIS — I110 Hypertensive heart disease with heart failure: Secondary | ICD-10-CM | POA: Insufficient documentation

## 2018-04-20 DIAGNOSIS — F329 Major depressive disorder, single episode, unspecified: Secondary | ICD-10-CM | POA: Diagnosis not present

## 2018-04-20 DIAGNOSIS — Z8711 Personal history of peptic ulcer disease: Secondary | ICD-10-CM | POA: Insufficient documentation

## 2018-04-20 DIAGNOSIS — Z85828 Personal history of other malignant neoplasm of skin: Secondary | ICD-10-CM | POA: Diagnosis not present

## 2018-04-20 DIAGNOSIS — K219 Gastro-esophageal reflux disease without esophagitis: Secondary | ICD-10-CM | POA: Insufficient documentation

## 2018-04-20 DIAGNOSIS — Z950 Presence of cardiac pacemaker: Secondary | ICD-10-CM | POA: Diagnosis present

## 2018-04-20 DIAGNOSIS — Z79899 Other long term (current) drug therapy: Secondary | ICD-10-CM | POA: Diagnosis not present

## 2018-04-20 DIAGNOSIS — Z9841 Cataract extraction status, right eye: Secondary | ICD-10-CM | POA: Insufficient documentation

## 2018-04-20 DIAGNOSIS — Z87891 Personal history of nicotine dependence: Secondary | ICD-10-CM | POA: Insufficient documentation

## 2018-04-20 DIAGNOSIS — Z8042 Family history of malignant neoplasm of prostate: Secondary | ICD-10-CM | POA: Insufficient documentation

## 2018-04-20 DIAGNOSIS — I5032 Chronic diastolic (congestive) heart failure: Secondary | ICD-10-CM | POA: Diagnosis not present

## 2018-04-20 DIAGNOSIS — E669 Obesity, unspecified: Secondary | ICD-10-CM | POA: Insufficient documentation

## 2018-04-20 DIAGNOSIS — Z8719 Personal history of other diseases of the digestive system: Secondary | ICD-10-CM | POA: Insufficient documentation

## 2018-04-20 DIAGNOSIS — Z803 Family history of malignant neoplasm of breast: Secondary | ICD-10-CM | POA: Insufficient documentation

## 2018-04-20 DIAGNOSIS — E559 Vitamin D deficiency, unspecified: Secondary | ICD-10-CM | POA: Insufficient documentation

## 2018-04-20 DIAGNOSIS — Z90711 Acquired absence of uterus with remaining cervical stump: Secondary | ICD-10-CM | POA: Insufficient documentation

## 2018-04-20 HISTORY — DX: Presence of cardiac pacemaker: Z95.0

## 2018-04-20 HISTORY — DX: Dependence on supplemental oxygen: Z99.81

## 2018-04-20 HISTORY — DX: Other seasonal allergic rhinitis: J30.2

## 2018-04-20 HISTORY — PX: PACEMAKER IMPLANT: EP1218

## 2018-04-20 HISTORY — PX: INSERT / REPLACE / REMOVE PACEMAKER: SUR710

## 2018-04-20 LAB — SURGICAL PCR SCREEN
MRSA, PCR: NEGATIVE
Staphylococcus aureus: NEGATIVE

## 2018-04-20 SURGERY — PACEMAKER IMPLANT
Anesthesia: LOCAL

## 2018-04-20 MED ORDER — MIDAZOLAM HCL 5 MG/5ML IJ SOLN
INTRAMUSCULAR | Status: DC | PRN
  Administered 2018-04-20 (×7): 1 mg via INTRAVENOUS

## 2018-04-20 MED ORDER — FENTANYL CITRATE (PF) 100 MCG/2ML IJ SOLN
INTRAMUSCULAR | Status: AC
  Filled 2018-04-20: qty 2

## 2018-04-20 MED ORDER — FERROUS SULFATE 325 (65 FE) MG PO TABS
325.0000 mg | ORAL_TABLET | Freq: Two times a day (BID) | ORAL | Status: DC
  Administered 2018-04-20 – 2018-04-21 (×2): 325 mg via ORAL
  Filled 2018-04-20 (×2): qty 1

## 2018-04-20 MED ORDER — ONDANSETRON HCL 4 MG/2ML IJ SOLN: 4.0000 mg | Freq: Four times a day (QID) | INTRAMUSCULAR | Status: DC | PRN

## 2018-04-20 MED ORDER — ACETAMINOPHEN 325 MG PO TABS
ORAL_TABLET | ORAL | Status: AC
  Filled 2018-04-20: qty 2

## 2018-04-20 MED ORDER — HEPARIN (PORCINE) IN NACL 1000-0.9 UT/500ML-% IV SOLN
INTRAVENOUS | Status: DC | PRN
  Administered 2018-04-20: 500 mL

## 2018-04-20 MED ORDER — VITAMIN B-12 1000 MCG PO TABS: 1000.0000 ug | ORAL_TABLET | Freq: Every day | ORAL | Status: DC

## 2018-04-20 MED ORDER — LIDOCAINE HCL 1 % IJ SOLN
INTRAMUSCULAR | Status: AC
  Filled 2018-04-20: qty 60

## 2018-04-20 MED ORDER — BUDESONIDE 0.5 MG/2ML IN SUSP
0.5000 mg | Freq: Two times a day (BID) | RESPIRATORY_TRACT | Status: DC
  Administered 2018-04-20 – 2018-04-21 (×2): 0.5 mg via RESPIRATORY_TRACT
  Filled 2018-04-20 (×2): qty 2

## 2018-04-20 MED ORDER — SODIUM CHLORIDE 0.9 % IV SOLN
INTRAVENOUS | Status: AC
  Filled 2018-04-20: qty 2

## 2018-04-20 MED ORDER — FLUTICASONE PROPIONATE 50 MCG/ACT NA SUSP
1.0000 | Freq: Every day | NASAL | Status: DC
  Administered 2018-04-21: 1 via NASAL
  Filled 2018-04-20: qty 16

## 2018-04-20 MED ORDER — PAROXETINE HCL 20 MG PO TABS
20.0000 mg | ORAL_TABLET | Freq: Every day | ORAL | Status: DC
  Administered 2018-04-20: 20 mg via ORAL
  Filled 2018-04-20 (×2): qty 1

## 2018-04-20 MED ORDER — LORATADINE 10 MG PO TABS
10.0000 mg | ORAL_TABLET | Freq: Every day | ORAL | Status: DC
  Administered 2018-04-20 – 2018-04-21 (×2): 10 mg via ORAL
  Filled 2018-04-20 (×2): qty 1

## 2018-04-20 MED ORDER — MUPIROCIN 2 % EX OINT
TOPICAL_OINTMENT | CUTANEOUS | Status: AC
  Administered 2018-04-20: 08:00:00
  Filled 2018-04-20: qty 22

## 2018-04-20 MED ORDER — IPRATROPIUM-ALBUTEROL 0.5-2.5 (3) MG/3ML IN SOLN
3.0000 mL | Freq: Two times a day (BID) | RESPIRATORY_TRACT | Status: DC
  Administered 2018-04-20 – 2018-04-21 (×2): 3 mL via RESPIRATORY_TRACT
  Filled 2018-04-20 (×2): qty 3

## 2018-04-20 MED ORDER — VANCOMYCIN HCL IN DEXTROSE 1-5 GM/200ML-% IV SOLN
1000.0000 mg | INTRAVENOUS | Status: AC
  Administered 2018-04-20: 1000 mg via INTRAVENOUS
  Filled 2018-04-20 (×2): qty 200

## 2018-04-20 MED ORDER — VANCOMYCIN HCL 500 MG IV SOLR
500.0000 mg | INTRAVENOUS | Status: AC
  Administered 2018-04-20: 500 mg via INTRAVENOUS
  Filled 2018-04-20: qty 500

## 2018-04-20 MED ORDER — LEVOCETIRIZINE DIHYDROCHLORIDE 5 MG PO TABS: 5.0000 mg | ORAL_TABLET | Freq: Every day | ORAL | Status: DC

## 2018-04-20 MED ORDER — MONTELUKAST SODIUM 10 MG PO TABS
10.0000 mg | ORAL_TABLET | Freq: Every day | ORAL | Status: DC
  Administered 2018-04-20: 10 mg via ORAL
  Filled 2018-04-20: qty 1

## 2018-04-20 MED ORDER — VANCOMYCIN HCL IN DEXTROSE 1-5 GM/200ML-% IV SOLN
1000.0000 mg | Freq: Two times a day (BID) | INTRAVENOUS | Status: AC
  Administered 2018-04-21: 1000 mg via INTRAVENOUS
  Filled 2018-04-20: qty 200

## 2018-04-20 MED ORDER — SODIUM CHLORIDE 0.9 % IV SOLN
80.0000 mg | INTRAVENOUS | Status: AC
  Administered 2018-04-20: 80 mg
  Filled 2018-04-20: qty 2

## 2018-04-20 MED ORDER — CALCIUM 600-200 MG-UNIT PO TABS: 1.0000 | ORAL_TABLET | Freq: Every day | ORAL | Status: DC

## 2018-04-20 MED ORDER — FENTANYL CITRATE (PF) 100 MCG/2ML IJ SOLN
INTRAMUSCULAR | Status: DC | PRN
  Administered 2018-04-20 (×7): 12.5 ug via INTRAVENOUS

## 2018-04-20 MED ORDER — DOFETILIDE 500 MCG PO CAPS
500.0000 ug | ORAL_CAPSULE | Freq: Two times a day (BID) | ORAL | Status: DC
  Administered 2018-04-20 – 2018-04-21 (×2): 500 ug via ORAL
  Filled 2018-04-20 (×2): qty 1

## 2018-04-20 MED ORDER — CHLORHEXIDINE GLUCONATE 4 % EX LIQD
60.0000 mL | Freq: Once | CUTANEOUS | Status: DC
  Filled 2018-04-20: qty 60

## 2018-04-20 MED ORDER — CALCIUM CARBONATE-VITAMIN D 500-200 MG-UNIT PO TABS
1.0000 | ORAL_TABLET | Freq: Every day | ORAL | Status: DC
  Administered 2018-04-20: 1 via ORAL
  Filled 2018-04-20: qty 1

## 2018-04-20 MED ORDER — SODIUM CHLORIDE 0.9 % IV SOLN
INTRAVENOUS | Status: DC
  Administered 2018-04-20: 08:00:00 via INTRAVENOUS

## 2018-04-20 MED ORDER — ACETAMINOPHEN 500 MG PO TABS: 500.0000 mg | ORAL_TABLET | Freq: Every day | ORAL | Status: DC | PRN

## 2018-04-20 MED ORDER — MIDAZOLAM HCL 5 MG/5ML IJ SOLN
INTRAMUSCULAR | Status: AC
  Filled 2018-04-20: qty 5

## 2018-04-20 MED ORDER — PANTOPRAZOLE SODIUM 40 MG PO TBEC
40.0000 mg | DELAYED_RELEASE_TABLET | Freq: Every day | ORAL | Status: DC
  Administered 2018-04-20 – 2018-04-21 (×2): 40 mg via ORAL
  Filled 2018-04-20 (×2): qty 1

## 2018-04-20 MED ORDER — LIDOCAINE HCL (PF) 1 % IJ SOLN
INTRAMUSCULAR | Status: DC | PRN
  Administered 2018-04-20: 60 mL

## 2018-04-20 MED ORDER — INFLUENZA VAC SPLIT HIGH-DOSE 0.5 ML IM SUSY
0.5000 mL | PREFILLED_SYRINGE | INTRAMUSCULAR | Status: AC
  Administered 2018-04-21: 0.5 mL via INTRAMUSCULAR
  Filled 2018-04-20: qty 0.5

## 2018-04-20 MED ORDER — ARFORMOTEROL TARTRATE 15 MCG/2ML IN NEBU
15.0000 ug | INHALATION_SOLUTION | Freq: Two times a day (BID) | RESPIRATORY_TRACT | Status: DC
  Administered 2018-04-20 – 2018-04-21 (×2): 15 ug via RESPIRATORY_TRACT
  Filled 2018-04-20 (×2): qty 2

## 2018-04-20 MED ORDER — HEPARIN (PORCINE) IN NACL 1000-0.9 UT/500ML-% IV SOLN
INTRAVENOUS | Status: AC
  Filled 2018-04-20: qty 500

## 2018-04-20 MED ORDER — BIOTIN 10000 MCG PO TABS: 10000.0000 ug | ORAL_TABLET | Freq: Every day | ORAL | Status: DC

## 2018-04-20 MED ORDER — ACETAMINOPHEN 325 MG PO TABS
325.0000 mg | ORAL_TABLET | ORAL | Status: DC | PRN
  Administered 2018-04-20 – 2018-04-21 (×2): 650 mg via ORAL
  Filled 2018-04-20 (×2): qty 1

## 2018-04-20 SURGICAL SUPPLY — 12 items
CABLE SURGICAL S-101-97-12 (CABLE) ×2 IMPLANT
CATH RIGHTSITE C315HIS02 (CATHETERS) ×4 IMPLANT
LEAD SELECT SECURE 3830 383069 (Lead) ×1 IMPLANT
LEAD TENDRIL MRI 52CM LPA1200M (Lead) ×2 IMPLANT
PACEMAKER ASSURITY DR-RF (Pacemaker) ×2 IMPLANT
PAD PRO RADIOLUCENT 2001M-C (PAD) ×2 IMPLANT
SELECT SECURE 3830 383069 (Lead) ×2 IMPLANT
SHEATH CLASSIC 7F (SHEATH) ×2 IMPLANT
SHEATH CLASSIC 8F (SHEATH) ×4 IMPLANT
SLITTER 6232ADJ (MISCELLANEOUS) ×2 IMPLANT
TRAY PACEMAKER INSERTION (PACKS) ×2 IMPLANT
WIRE HI TORQ VERSACORE-J 145CM (WIRE) ×2 IMPLANT

## 2018-04-20 NOTE — Interval H&P Note (Signed)
History and Physical Interval Note:  04/20/2018 8:23 AM  Tricia Hernandez  has presented today for surgery, with the diagnosis of bradycardia  The various methods of treatment have been discussed with the patient and family. After consideration of risks, benefits and other options for treatment, the patient has consented to  Procedure(s): PACEMAKER IMPLANT (N/A) as a surgical intervention .  The patient's history has been reviewed, patient examined, no change in status, stable for surgery.  I have reviewed the patient's chart and labs.  Questions were answered to the patient's satisfaction.     Cristopher Peru

## 2018-04-20 NOTE — Discharge Summary (Addendum)
ELECTROPHYSIOLOGY PROCEDURE DISCHARGE SUMMARY    Patient ID: Tricia Hernandez,  MRN: [id number], DOB/AGE: [birth year] 84 y.o.  Admit date: 04/20/2018 Discharge date: 04/21/18  Primary Care Physician: Lance Sell, NP  Electrophysiologist: Dr. Lovena Le  Primary Discharge Diagnosis:  1. Sinus node dysfunction 2. Symptomatic sinus bradycardia  Secondary Discharge Diagnosis:  1. HTN 2. PAfib     CHA2DS2Vasc is 4, not on a/c 2/2 recurrent severe GIB 3. Chronic CHF (disatolic) 4. COPD, bronchiectasis  Allergies  Allergen Reactions  . Penicillins Other (See Comments)    Reaction:  Unknown  Has patient had a PCN reaction causing immediate rash, facial/tongue/throat swelling, SOB or lightheadedness with hypotension: Unsure Has patient had a PCN reaction causing severe rash involving mucus membranes or skin necrosis: Unsure Has patient had a PCN reaction that required hospitalization Unsure Has patient had a PCN reaction occurring within the last 10 years: Unsure If all of the above answers are "NO", then may proceed with Cephalosporin use.   [name] [Naproxen Sodium] Hives  . Antihistamines, Chlorpheniramine-Type Rash  . Aspirin Rash  . Benadryl [Diphenhydramine Hcl] Palpitations and Other (See Comments)    Increases heart rate  . Codeine Rash  . Hydrochlorothiazide Rash  . Rocephin [Ceftriaxone Sodium In Dextrose] Rash  . Sulfa Antibiotics Rash     Procedures This Admission:  1.  Implantation of a SJM dual chamber PPM on 04/20/18 by Dr Lovena Le.  The patient receivedSt. Jude (serial number [id number]) pacemaker, St. Jude (serial number [id number]) right atrial lead and a Medtronic 2426 (serial number [id number]) right ventricular lead  There were no immediate post procedure complications. 2.  CXR on 04/21/18 demonstrated no pneumothorax status post device implantation.   Brief HPI: Tricia Hernandez is a 84 y.o. female is followed in the outpatient setting for PAF, sinus  bradycardia.  Past medical history includes above.  The patient has had symptomatic bradycardia without reversible causes identified.  Risks, benefits, and alternatives to PPM implantation were reviewed with the patient who wished to proceed.   Hospital Course:  The patient was admitted and underwent implantation of a PPM with details as outlined above. She was monitored on telemetry overnight which demonstrated AP/VS rhythm.  Left chest was without hematoma or ecchymosis.  The device was interrogated and found to be functioning normally.  CXR was obtained and demonstrated no pneumothorax status post device implantation.  Wound care, arm mobility, and restrictions were reviewed with the patient.  The patient feels well this morning, no CP or SOB, she was examined by Dr. Lovena Le and considered her stable for discharge to home. BMET, mag today look OK for her Tikosyn, QTc stable on EKG.   Physical Exam: Vitals:   04/21/18 0035 04/21/18 0537 04/21/18 0753 04/21/18 0829  BP: (!) 130/46 (!) 133/54  (!) 135/47  Pulse: 60 (!) 59  60  Resp: _0 Temp: 98.3 F (36.8 C) 98.5 F (36.9 C)  97.9 F (36.6 C)  TempSrc: Oral Oral  Oral  SpO2: 91% 92% 96% 93%  Weight:  104.2 kg    Height:        GEN- The patient is well appearing, alert and oriented x 3 today.   HEENT: normocephalic, atraumatic; sclera clear, conjunctiva pink; hearing intact; oropharynx clear; neck supple, no JVP Lungs- CTA b/l, normal work of breathing.  No wheezes, rales, rhonchi Heart- RRR, no murmurs, rubs or gallops, PMI not laterally displaced GI- soft, non-tender, non-distended Extremities- no clubbing,  cyanosis, or edema MS- no significant deformity, age appropriate atrophy Skin- warm and dry, no rash or lesion,  left chest without hematoma/ecchymosis Psych- euthymic mood, full affect Neuro- no gross deficits   Labs:   Lab Results  Component Value Date   WBC 5.2 04/10/2018   HGB 11.7 04/10/2018   HCT 37.1  04/10/2018   MCV 88 04/10/2018   PLT 146 (L) 04/10/2018    Recent Labs  Lab 04/21/18 0759  NA 140  K 4.2  CL 104  CO2 29  BUN 17  CREATININE 1.15*  CALCIUM 8.9  GLUCOSE 102*    Discharge Medications:  Allergies as of 04/21/2018      Reactions   Penicillins Other (See Comments)   Reaction:  Unknown  Has patient had a PCN reaction causing immediate rash, facial/tongue/throat swelling, SOB or lightheadedness with hypotension: Unsure Has patient had a PCN reaction causing severe rash involving mucus membranes or skin necrosis: Unsure Has patient had a PCN reaction that required hospitalization Unsure Has patient had a PCN reaction occurring within the last 10 years: Unsure If all of the above answers are "NO", then may proceed with Cephalosporin use.   Aleve [naproxen Sodium] Hives   Antihistamines, Chlorpheniramine-type Rash   Aspirin Rash   Benadryl [diphenhydramine Hcl] Palpitations, Other (See Comments)   Increases heart rate   Codeine Rash   Hydrochlorothiazide Rash   Rocephin [ceftriaxone Sodium In Dextrose] Rash   Sulfa Antibiotics Rash      Medication List    TAKE these medications   acetaminophen 500 MG tablet Commonly known as:  TYLENOL Take 500 mg by mouth daily as needed for moderate pain.   arformoterol 15 MCG/2ML Nebu Commonly known as:  BROVANA Take 2 mLs (15 mcg total) by nebulization 2 (two) times daily. Dx copd: J44.9   atorvastatin 20 MG tablet Commonly known as:  LIPITOR TAKE 1 TABLET(20 MG) BY MOUTH AT BEDTIME What changed:  See the new instructions.   Biotin 10000 MCG Tabs Take 10,000 mcg by mouth at bedtime.   budesonide 0.5 MG/2ML nebulizer solution Commonly known as:  PULMICORT Take 2 mLs (0.5 mg total) by nebulization 2 (two) times daily. Dx code: j57.9   Calcium 600-200 MG-UNIT tablet Take 1 tablet by mouth at bedtime.   dofetilide 500 MCG capsule Commonly known as:  TIKOSYN Take 1 capsule (500 mcg total) by mouth 2 (two) times  daily.   fenofibrate 160 MG tablet TAKE 1 TABLET(160 MG) BY MOUTH DAILY What changed:  See the new instructions.   ferrous sulfate 325 (65 FE) MG EC tablet Take 325 mg by mouth 2 (two) times daily.   fluticasone 50 MCG/ACT nasal spray Commonly known as:  FLONASE Place 1 spray into both nostrils daily.   furosemide 20 MG tablet Commonly known as:  LASIX TAKE 1 TABLET(20 MG) BY MOUTH DAILY What changed:  See the new instructions.   ipratropium-albuterol 0.5-2.5 (3) MG/3ML Soln Commonly known as:  DUONEB Take 3 mLs by nebulization 2 (two) times daily.   levocetirizine 5 MG tablet Commonly known as:  XYZAL Take 5 mg by mouth at bedtime.   MAGNESIUM-OXIDE 400 (241.3 Mg) MG tablet Generic drug:  magnesium oxide TAKE 1 TABLET BY MOUTH ONCE A DAY What changed:  how much to take   montelukast 10 MG tablet Commonly known as:  SINGULAIR Take 1 tablet (10 mg total) by mouth at bedtime. Must see provider for future refills   Ostomy Supplies Kit [phone] is number  on bag one piece unit. Colostomy bag and adhesive and alcohol prep, no sting, cannot use regular prep pad.   pantoprazole 40 MG tablet Commonly known as:  PROTONIX Take 1 tablet (40 mg total) by mouth daily. Follow-up appt is due for future refills   PARoxetine 20 MG tablet Commonly known as:  PAXIL TAKE 1 TABLET(20 MG) BY MOUTH DAILY What changed:  See the new instructions.   vitamin B-12 1000 MCG tablet Commonly known as:  CYANOCOBALAMIN Take 1,000 mcg by mouth at bedtime.   Vitamin D (Ergocalciferol) 50000 units Caps capsule Commonly known as:  DRISDOL TAKE 1 CAPSULE BY MOUTH 1 TIME A WEEK What changed:  See the new instructions.       Disposition: Home  Discharge Instructions    Diet - low sodium heart healthy   Complete by:  As directed    Increase activity slowly   Complete by:  As directed      Follow-up Information    [facility] Follow up on 05/04/2018.   Specialty:   Cardiology Why:  2:00PM, wound check visit Contact information: [address], [address] [place]       Evans Lance, MD Follow up on 07/20/2018.   Specialty:  Cardiology Why:  11:45AM Contact information: [address] [address] [phone]           Duration of Discharge Encounter: Greater than 30 minutes including physician time.  Venetia Night, PA-C 04/21/2018 9:37 AM  EP Attending  Patient seen and examined. Agree with above. The patient is stable after PPM insertion. PM interogation under my supervision demonstrates normal device function. CXR looks good. She will be discharged home with usual followup.   Mikle Bosworth.D.

## 2018-04-20 NOTE — Progress Notes (Signed)
PHARMACIST - PHYSICIAN ORDER COMMUNICATION  CONCERNING: P&T Medication Policy on Herbal Medications  DESCRIPTION:  This patient's order for:  Biotin  has been noted.  This product(s) is classified as an "herbal" or natural product. Due to a lack of definitive safety studies or FDA approval, nonstandard manufacturing practices, plus the potential risk of unknown drug-drug interactions while on inpatient medications, the Pharmacy and Therapeutics Committee does not permit the use of "herbal" or natural products of this type within [facility].   ACTION TAKEN: The pharmacy department is unable to verify this order at this time.  Please reevaluate patient's clinical condition at discharge and address if the herbal or natural product(s) should be resumed at that time.  Doylene Canard, PharmD Clinical Pharmacist  Pager: [phone] Phone: [phone]

## 2018-04-20 NOTE — Interval H&P Note (Signed)
History and Physical Interval Note:  04/20/2018 8:24 AM  Tricia Hernandez  has presented today for surgery, with the diagnosis of bradycardia  The various methods of treatment have been discussed with the patient and family. After consideration of risks, benefits and other options for treatment, the patient has consented to  Procedure(s): PACEMAKER IMPLANT (N/A) as a surgical intervention .  The patient's history has been reviewed, patient examined, no change in status, stable for surgery.  I have reviewed the patient's chart and labs.  Questions were answered to the patient's satisfaction.     Cristopher Peru

## 2018-04-20 NOTE — Discharge Instructions (Signed)
° ° °  Supplemental Discharge Instructions for  Pacemaker/Defibrillator Patients  Activity No heavy lifting or vigorous activity with your left/right arm for 6 to 8 weeks.  Do not raise your left/right arm above your head for one week.  Gradually raise your affected arm as drawn below.              04/24/18                     04/25/18                    04/26/18                  04/27/18 __  NO DRIVING for  1 week  ; you may begin driving on  4/48/18  .  WOUND CARE - Keep the wound area clean and dry.  Do not get this area wet for one week. No showers for one week; you may shower on  04/27/18  . - The tape/steri-strips on your wound will fall off; do not pull them off.  No bandage is needed on the site.  DO  NOT apply any creams, oils, or ointments to the wound area. - If you notice any drainage or discharge from the wound, any swelling or bruising at the site, or you develop a fever > 101? F after you are discharged home, call the office at once.  Special Instructions - You are still able to use cellular telephones; use the ear opposite the side where you have your pacemaker/defibrillator.  Avoid carrying your cellular phone near your device. - When traveling through airports, show security personnel your identification card to avoid being screened in the metal detectors.  Ask the security personnel to use the hand wand. - Avoid arc welding equipment, MRI testing (magnetic resonance imaging), TENS units (transcutaneous nerve stimulators).  Call the office for questions about other devices. - Avoid electrical appliances that are in poor condition or are not properly grounded. - Microwave ovens are safe to be near or to operate.  Additional information for defibrillator patients should your device go off: - If your device goes off ONCE and you feel fine afterward, notify the device clinic nurses. - If your device goes off ONCE and you do not feel well afterward, call 911. - If your device goes  off TWICE, call 911. - If your device goes off THREE times in one day, call 911.  DO NOT DRIVE YOURSELF OR A FAMILY MEMBER WITH A DEFIBRILLATOR TO THE HOSPITAL--CALL 911.

## 2018-04-21 ENCOUNTER — Ambulatory Visit (HOSPITAL_COMMUNITY): Payer: Medicare Other

## 2018-04-21 ENCOUNTER — Encounter (HOSPITAL_COMMUNITY): Payer: Self-pay | Admitting: Internal Medicine

## 2018-04-21 DIAGNOSIS — E785 Hyperlipidemia, unspecified: Secondary | ICD-10-CM | POA: Diagnosis not present

## 2018-04-21 DIAGNOSIS — Z950 Presence of cardiac pacemaker: Secondary | ICD-10-CM | POA: Diagnosis not present

## 2018-04-21 DIAGNOSIS — Z87891 Personal history of nicotine dependence: Secondary | ICD-10-CM | POA: Diagnosis not present

## 2018-04-21 DIAGNOSIS — F419 Anxiety disorder, unspecified: Secondary | ICD-10-CM | POA: Diagnosis not present

## 2018-04-21 DIAGNOSIS — I495 Sick sinus syndrome: Secondary | ICD-10-CM

## 2018-04-21 DIAGNOSIS — I5032 Chronic diastolic (congestive) heart failure: Secondary | ICD-10-CM | POA: Diagnosis not present

## 2018-04-21 DIAGNOSIS — E559 Vitamin D deficiency, unspecified: Secondary | ICD-10-CM | POA: Diagnosis not present

## 2018-04-21 DIAGNOSIS — E669 Obesity, unspecified: Secondary | ICD-10-CM | POA: Diagnosis not present

## 2018-04-21 DIAGNOSIS — Z23 Encounter for immunization: Secondary | ICD-10-CM | POA: Diagnosis not present

## 2018-04-21 DIAGNOSIS — I480 Paroxysmal atrial fibrillation: Secondary | ICD-10-CM | POA: Diagnosis not present

## 2018-04-21 DIAGNOSIS — I471 Supraventricular tachycardia: Secondary | ICD-10-CM | POA: Diagnosis not present

## 2018-04-21 DIAGNOSIS — F329 Major depressive disorder, single episode, unspecified: Secondary | ICD-10-CM | POA: Diagnosis not present

## 2018-04-21 DIAGNOSIS — I110 Hypertensive heart disease with heart failure: Secondary | ICD-10-CM | POA: Diagnosis not present

## 2018-04-21 LAB — BASIC METABOLIC PANEL
Anion gap: 7 (ref 5–15)
BUN: 17 mg/dL (ref 8–23)
CALCIUM: 8.9 mg/dL (ref 8.9–10.3)
CO2: 29 mmol/L (ref 22–32)
Chloride: 104 mmol/L (ref 98–111)
Creatinine, Ser: 1.15 mg/dL — ABNORMAL HIGH (ref 0.44–1.00)
GFR calc non Af Amer: 42 mL/min — ABNORMAL LOW (ref >60)
GFR, EST AFRICAN AMERICAN: 49 mL/min — AB (ref >60)
Glucose, Bld: 102 mg/dL — ABNORMAL HIGH (ref 70–99)
Potassium: 4.2 mmol/L (ref 3.5–5.1)
SODIUM: 140 mmol/L (ref 135–145)

## 2018-04-21 LAB — MAGNESIUM: MAGNESIUM: 2 mg/dL (ref 1.7–2.4)

## 2018-04-21 MED FILL — Lidocaine HCl Local Inj 1%: INTRAMUSCULAR | Qty: 20 | Status: AC

## 2018-04-21 NOTE — Progress Notes (Signed)
Patient has an order for SCDs.  SCDs were ordered with portable but they stated they do not have anymore.

## 2018-04-21 NOTE — Plan of Care (Signed)
  Problem: Education: Goal: Knowledge of General Education information will improve Description Including pain rating scale, medication(s)/side effects and non-pharmacologic comfort measures Outcome: Progressing   Problem: Health Behavior/Discharge Planning: Goal: Ability to manage health-related needs will improve Outcome: Progressing   Problem: Clinical Measurements: Goal: Will remain free from infection Outcome: Progressing Goal: Cardiovascular complication will be avoided Outcome: Progressing   Problem: Activity: Goal: Risk for activity intolerance will decrease Outcome: Progressing   Problem: Nutrition: Goal: Adequate nutrition will be maintained Outcome: Progressing   Problem: Coping: Goal: Level of anxiety will decrease Outcome: Progressing   Problem: Elimination: Goal: Will not experience complications related to bowel motility Outcome: Progressing Goal: Will not experience complications related to urinary retention Outcome: Progressing   Problem: Pain Managment: Goal: General experience of comfort will improve Outcome: Progressing   Problem: Safety: Goal: Ability to remain free from injury will improve Outcome: Progressing   Problem: Skin Integrity: Goal: Risk for impaired skin integrity will decrease Outcome: Progressing

## 2018-04-21 NOTE — Progress Notes (Signed)
Pt telemetry and IV removed by primary RN  Discharge education provided at bedside with pt and pt daughter. Pt has all belongings including pacer equipment. Called volunteer services for wheelchair.

## 2018-04-23 ENCOUNTER — Telehealth: Payer: Self-pay

## 2018-04-23 NOTE — Telephone Encounter (Signed)
Pt is on tcm list. Pt was admitted on 04/20/2018 for sinus node dysfunction and symptomatic sinus bradycardia. Pt was dc'ed on 04/21/2018. Pt to follow up with cardiology.

## 2018-04-25 ENCOUNTER — Other Ambulatory Visit: Payer: Self-pay | Admitting: Family Medicine

## 2018-04-25 DIAGNOSIS — E782 Mixed hyperlipidemia: Secondary | ICD-10-CM

## 2018-04-27 ENCOUNTER — Other Ambulatory Visit: Payer: Self-pay | Admitting: *Deleted

## 2018-04-27 DIAGNOSIS — I872 Venous insufficiency (chronic) (peripheral): Secondary | ICD-10-CM

## 2018-04-27 DIAGNOSIS — J42 Unspecified chronic bronchitis: Secondary | ICD-10-CM

## 2018-04-27 MED ORDER — MONTELUKAST SODIUM 10 MG PO TABS: 10.0000 mg | ORAL_TABLET | Freq: Every day | ORAL | 0 refills | Status: DC

## 2018-04-27 MED ORDER — FUROSEMIDE 20 MG PO TABS: ORAL_TABLET | ORAL | 0 refills | Status: DC

## 2018-04-27 NOTE — Addendum Note (Signed)
Addended by: Cresenciano Lick on: 04/27/2018 09:50 AM   Modules accepted: Orders

## 2018-05-01 ENCOUNTER — Inpatient Hospital Stay: Payer: Medicare Other | Admitting: Nurse Practitioner

## 2018-05-04 ENCOUNTER — Other Ambulatory Visit: Payer: Self-pay | Admitting: Hematology and Oncology

## 2018-05-04 ENCOUNTER — Ambulatory Visit (INDEPENDENT_AMBULATORY_CARE_PROVIDER_SITE_OTHER): Payer: Medicare Other | Admitting: *Deleted

## 2018-05-04 ENCOUNTER — Ambulatory Visit (INDEPENDENT_AMBULATORY_CARE_PROVIDER_SITE_OTHER): Payer: Medicare Other | Admitting: Nurse Practitioner

## 2018-05-04 ENCOUNTER — Other Ambulatory Visit (INDEPENDENT_AMBULATORY_CARE_PROVIDER_SITE_OTHER): Payer: Medicare Other

## 2018-05-04 ENCOUNTER — Encounter: Payer: Self-pay | Admitting: Nurse Practitioner

## 2018-05-04 ENCOUNTER — Other Ambulatory Visit: Payer: Self-pay | Admitting: Nurse Practitioner

## 2018-05-04 VITALS — BP 124/60 | HR 64 | Ht 64.0 in | Wt 230.0 lb

## 2018-05-04 DIAGNOSIS — E782 Mixed hyperlipidemia: Secondary | ICD-10-CM

## 2018-05-04 DIAGNOSIS — N289 Disorder of kidney and ureter, unspecified: Secondary | ICD-10-CM

## 2018-05-04 DIAGNOSIS — K219 Gastro-esophageal reflux disease without esophagitis: Secondary | ICD-10-CM

## 2018-05-04 DIAGNOSIS — I10 Essential (primary) hypertension: Secondary | ICD-10-CM | POA: Diagnosis not present

## 2018-05-04 DIAGNOSIS — R001 Bradycardia, unspecified: Secondary | ICD-10-CM

## 2018-05-04 DIAGNOSIS — Z950 Presence of cardiac pacemaker: Secondary | ICD-10-CM

## 2018-05-04 DIAGNOSIS — I495 Sick sinus syndrome: Secondary | ICD-10-CM

## 2018-05-04 LAB — CUP PACEART INCLINIC DEVICE CHECK
Battery Remaining Longevity: 128 mo
Battery Voltage: 3.11 V
Brady Statistic RV Percent Paced: 0.64 %
Date Time Interrogation Session: 20190923144551
Implantable Lead Implant Date: 20190909
Implantable Lead Location: 753859
Implantable Pulse Generator Implant Date: 20190909
Lead Channel Pacing Threshold Amplitude: 0.5 V
Lead Channel Pacing Threshold Amplitude: 0.5 V
Lead Channel Pacing Threshold Amplitude: 0.5 V
Lead Channel Pacing Threshold Pulse Width: 0.5 ms
Lead Channel Pacing Threshold Pulse Width: 0.5 ms
Lead Channel Pacing Threshold Pulse Width: 1 ms
Lead Channel Setting Pacing Amplitude: 1.5 V
Lead Channel Setting Pacing Amplitude: 3.5 V
Lead Channel Setting Pacing Pulse Width: 1 ms
Lead Channel Setting Sensing Sensitivity: 2 mV
MDC IDC LEAD IMPLANT DT: 20190909
MDC IDC LEAD LOCATION: 753860
MDC IDC MSMT LEADCHNL RA IMPEDANCE VALUE: 412.5 Ohm
MDC IDC MSMT LEADCHNL RA SENSING INTR AMPL: 3.1 mV
MDC IDC MSMT LEADCHNL RV IMPEDANCE VALUE: 487.5 Ohm
MDC IDC MSMT LEADCHNL RV PACING THRESHOLD AMPLITUDE: 0.5 V
MDC IDC MSMT LEADCHNL RV PACING THRESHOLD PULSEWIDTH: 1 ms
MDC IDC MSMT LEADCHNL RV SENSING INTR AMPL: 4.3 mV
MDC IDC PG SERIAL: 9061801
MDC IDC STAT BRADY RA PERCENT PACED: 95 %
Pulse Gen Model: 2272

## 2018-05-04 LAB — LIPID PANEL
CHOL/HDL RATIO: 3
Cholesterol: 159 mg/dL (ref 0–200)
HDL: 60.6 mg/dL (ref >39.00)
LDL Cholesterol: 79 mg/dL (ref 0–99)
NONHDL: 98.43
TRIGLYCERIDES: 95 mg/dL (ref 0.0–149.0)
VLDL: 19 mg/dL (ref 0.0–40.0)

## 2018-05-04 LAB — BASIC METABOLIC PANEL
BUN: 32 mg/dL — AB (ref 6–23)
CHLORIDE: 103 meq/L (ref 96–112)
CO2: 37 meq/L — AB (ref 19–32)
Calcium: 9.2 mg/dL (ref 8.4–10.5)
Creatinine, Ser: 1.33 mg/dL — ABNORMAL HIGH (ref 0.40–1.20)
GFR: 40.32 mL/min — ABNORMAL LOW (ref >60.00)
Glucose, Bld: 91 mg/dL (ref 70–99)
POTASSIUM: 5.3 meq/L — AB (ref 3.5–5.1)
Sodium: 143 mEq/L (ref 135–145)

## 2018-05-04 LAB — MAGNESIUM: MAGNESIUM: 2.3 mg/dL (ref 1.5–2.5)

## 2018-05-04 NOTE — Assessment & Plan Note (Signed)
Stable Continue regular F/U with cardiology  Update labs Discussed the role of healthy diet and exercise in the management of HTN, HLD and printed additional information on AVS - Basic metabolic panel; Future - Lipid panel; Future

## 2018-05-04 NOTE — Assessment & Plan Note (Signed)
Continue current medications Update labs F/U with further recommendations pending lab results Discussed the role of healthy diet and exercise in the management of HTN, HLD and printed additional information on AVS - Basic metabolic panel; Future - Lipid panel; Future

## 2018-05-04 NOTE — Assessment & Plan Note (Signed)
Update labs - Magnesium; Future

## 2018-05-04 NOTE — Patient Instructions (Addendum)
Labs downstairs today  I will plan to see you back in 6 months, or sooner if needed.    Mediterranean Diet A Mediterranean diet refers to food and lifestyle choices that are based on the traditions of countries located on the [employer]. This way of eating has been shown to help prevent certain conditions and improve outcomes for people who have chronic diseases, like kidney disease and heart disease. What are tips for following this plan? Lifestyle  Cook and eat meals together with your family, when possible.  Drink enough fluid to keep your urine clear or pale yellow.  Be physically active every day. This includes: ? Aerobic exercise like running or swimming. ? Leisure activities like gardening, walking, or housework.  Get 7-8 hours of sleep each night.  If recommended by your health care provider, drink red wine in moderation. This means 1 glass a day for nonpregnant women and 2 glasses a day for men. A glass of wine equals 5 oz (150 mL). Reading food labels  Check the serving size of packaged foods. For foods such as rice and pasta, the serving size refers to the amount of cooked product, not dry.  Check the total fat in packaged foods. Avoid foods that have saturated fat or trans fats.  Check the ingredients list for added sugars, such as corn syrup. Shopping  At the grocery store, buy most of your food from the areas near the walls of the store. This includes: ? Fresh fruits and vegetables (produce). ? Grains, beans, nuts, and seeds. Some of these may be available in unpackaged forms or large amounts (in bulk). ? Fresh seafood. ? Poultry and eggs. ? Low-fat dairy products.  Buy whole ingredients instead of prepackaged foods.  Buy fresh fruits and vegetables in-season from local farmers markets.  Buy frozen fruits and vegetables in resealable bags.  If you do not have access to quality fresh seafood, buy precooked frozen shrimp or canned fish, such as tuna,  salmon, or sardines.  Buy small amounts of raw or cooked vegetables, salads, or olives from the deli or salad bar at your store.  Stock your pantry so you always have certain foods on hand, such as olive oil, canned tuna, canned tomatoes, rice, pasta, and beans. Cooking  Cook foods with extra-virgin olive oil instead of using butter or other vegetable oils.  Have meat as a side dish, and have vegetables or grains as your main dish. This means having meat in small portions or adding small amounts of meat to foods like pasta or stew.  Use beans or vegetables instead of meat in common dishes like chili or lasagna.  Experiment with different cooking methods. Try roasting or broiling vegetables instead of steaming or sauteing them.  Add frozen vegetables to soups, stews, pasta, or rice.  Add nuts or seeds for added healthy fat at each meal. You can add these to yogurt, salads, or vegetable dishes.  Marinate fish or vegetables using olive oil, lemon juice, garlic, and fresh herbs. Meal planning  Plan to eat 1 vegetarian meal one day each week. Try to work up to 2 vegetarian meals, if possible.  Eat seafood 2 or more times a week.  Have healthy snacks readily available, such as: ? Vegetable sticks with hummus. ? [place] yogurt. ? Fruit and nut trail mix.  Eat balanced meals throughout the week. This includes: ? Fruit: 2-3 servings a day ? Vegetables: 4-5 servings a day ? Low-fat dairy: 2 servings a day ?  Fish, poultry, or lean meat: 1 serving a day ? Beans and legumes: 2 or more servings a week ? Nuts and seeds: 1-2 servings a day ? Whole grains: 6-8 servings a day ? Extra-virgin olive oil: 3-4 servings a day  Limit red meat and sweets to only a few servings a month What are my food choices?  Mediterranean diet ? Recommended ? Grains: Whole-grain pasta. Brown rice. Bulgar wheat. Polenta. Couscous. Whole-wheat bread. [place]. ? Vegetables: Artichokes. Beets. Broccoli.  Cabbage. Carrots. Eggplant. Green beans. Chard. Kale. Spinach. Onions. Leeks. Peas. Squash. Tomatoes. Peppers. Radishes. ? Fruits: Apples. Apricots. Avocado. Berries. Bananas. Cherries. Dates. Figs. Grapes. Lemons. Melon. Oranges. Peaches. Plums. Pomegranate. ? Meats and other protein foods: Beans. Almonds. Sunflower seeds. Pine nuts. Peanuts. [place]. Salmon. Scallops. Shrimp. [place]. Tilapia. Clams. Oysters. Eggs. ? Dairy: Low-fat milk. Cheese. Greek yogurt. ? Beverages: Water. Red wine. Herbal tea. ? Fats and oils: Extra virgin olive oil. Avocado oil. Grape seed oil. ? Sweets and desserts: [place] yogurt with honey. Baked apples. Poached pears. Trail mix. ? Seasoning and other foods: Basil. Cilantro. Coriander. Cumin. Mint. Parsley. Sage. Rosemary. Tarragon. Garlic. Oregano. Thyme. Pepper. Balsalmic vinegar. Tahini. Hummus. Tomato sauce. Olives. Mushrooms. ? Limit these ? Grains: Prepackaged pasta or rice dishes. Prepackaged cereal with added sugar. ? Vegetables: Deep fried potatoes (french fries). ? Fruits: Fruit canned in syrup. ? Meats and other protein foods: Beef. Pork. Lamb. Poultry with skin. Hot dogs. Berniece Salines. ? Dairy: Ice cream. Sour cream. Whole milk. ? Beverages: Juice. Sugar-sweetened soft drinks. Beer. Liquor and spirits. ? Fats and oils: Butter. Canola oil. Vegetable oil. Beef fat (tallow). Lard. ? Sweets and desserts: Cookies. Cakes. Pies. Candy. ? Seasoning and other foods: Mayonnaise. Premade sauces and marinades. ? The items listed may not be a complete list. Talk with your dietitian about what dietary choices are right for you. Summary  The Mediterranean diet includes both food and lifestyle choices.  Eat a variety of fresh fruits and vegetables, beans, nuts, seeds, and whole grains.  Limit the amount of red meat and sweets that you eat.  Talk with your health care provider about whether it is safe for you to drink red wine in moderation. This means 1 glass a day for  nonpregnant women and 2 glasses a day for men. A glass of wine equals 5 oz (150 mL). This information is not intended to replace advice given to you by your health care provider. Make sure you discuss any questions you have with your health care provider. Document Released: 03/21/2016 Document Revised: 04/23/2016 Document Reviewed: 03/21/2016 Elsevier Interactive Patient Education  [employer].

## 2018-05-04 NOTE — Progress Notes (Signed)
bmet  

## 2018-05-04 NOTE — Progress Notes (Signed)
Wound check appointment. Steri-strips removed. Wound without redness or edema. Incision edges approximated, wound well healed. Normal device function. Thresholds, sensing, and impedances consistent with implant measurements. Device programmed at 3.5V @ 80ms in RV (HIS) and auto capture programmed on in RA for extra safety margin until 3 month visit. HIS lead capture looks to be septal from 5V @ 25ms to LOC. Histogram distribution appropriate for patient and level of activity. 1 mode switch- 4 sec 1:1. No high ventricular rates noted. Patient educated about wound care, arm mobility, lifting restrictions. ROV with GT 07/20/18.

## 2018-05-04 NOTE — Assessment & Plan Note (Signed)
Stable Continue f/u with cardiology as instructed

## 2018-05-04 NOTE — Progress Notes (Signed)
Name: Tricia Hernandez   MRN: [id number]    DOB: 03-Aug-1931   Date:05/04/2018       Progress Note  Subjective  Chief Complaint Hospital follow up  HPI  Tricia Hernandez is here today for hospital follow up, accompanied by daughter, admitted on 04/20/18 to [facility] for sinus node dysfunction, symptomatic sinus bradycardia. A dual chamber PPM was placed by cardiology on 04/20/18 and there were no immediate complications, remained stable on 04/21/18 and she was discharged home on 04/21/18 with the following  Discharge Instructions    Diet - low sodium heart healthy   Complete by:  As directed    Increase activity slowly   Complete by:  As directed         Follow-up Information    [facility] [facility] Follow up on 05/04/2018.   Specialty:  Cardiology Why:  2:00PM, wound check visit Contact information: [address] [place] [phone]   Since home, she tells me that she is feeling well, much better then before pacemaker. Denies fevers, chills, weakness, chest pain, shortness of breath, palpitations. She reports normal appetite, sleeping well. She says she would like to go ahead and have her annual labs checked while here today, is also wondering if she can stop one of her cholesterol medications, maintained on atorvastatin, fenofibrate and taking daily as prescribed. She has upcoming cardiology appointment scheduled.   Patient Active Problem List   Diagnosis Date Noted  . Postsurgical cardiac pacemaker in situ 04/20/2018  . Sinus node dysfunction ([place]) 04/14/2018  . Vitamin D deficiency 05/27/2017  . Venous stasis dermatitis of both lower extremities 02/23/2017  . Anemia 02/22/2017  . Acid reflux 02/22/2017  . Colostomy care ([place]) 12/20/2016  . Angiodysplasia of duodenum   . GI bleeding 12/10/2016  . Mixed hyperlipidemia 11/05/2016  . Chronic bronchitis ([place]) 11/04/2016  . Persistent atrial fibrillation ([place]) 10/07/2016  . Acute  respiratory failure with hypoxia ([place]) 08/26/2016  . Essential hypertension   . Pulmonary nodules   . Palliative care encounter   . Anal cancer ([place]) 07/22/2016  . Atrial fibrillation ([place]), CHA2DS2-VASc Score 5 07/22/2016  . Pulmonary embolus ([place])   . Obesity   . Lung cancer [facility])     Past Surgical History:  Procedure Laterality Date  . APPENDECTOMY  1959  . BASAL CELL CARCINOMA EXCISION     nose  . BREAST CYST ASPIRATION Right 2016?  Marland Kitchen CATARACT EXTRACTION W/ INTRAOCULAR LENS  IMPLANT, BILATERAL Bilateral 1990s  . CHOLECYSTECTOMY OPEN  1980s  . COLON SURGERY    . COLOSTOMY N/A 07/29/2016   Procedure: COLOSTOMY;  Surgeon: Clovis Riley, MD;  Location: [place];  Service: General;  Laterality: N/A;  . DILATION AND CURETTAGE OF UTERUS    . ESOPHAGOGASTRODUODENOSCOPY N/A 12/11/2016   Procedure: ESOPHAGOGASTRODUODENOSCOPY (EGD);  Surgeon: Jerene Bears, MD;  Location: [facility] ENDOSCOPY;  Service: Endoscopy;  Laterality: N/A;  . EVALUATION UNDER ANESTHESIA WITH HEMORRHOIDECTOMY AND PROCTOSCOPY N/A 07/25/2016   Procedure: EXAM UNDER ANESTHESIA, EXCISION PERIANAL MASS.;  Surgeon: Judeth Horn, MD;  Location: [place];  Service: General;  Laterality: N/A;  Prone position  . FLEXIBLE SIGMOIDOSCOPY N/A 06/16/2017   Procedure: FLEXIBLE SIGMOIDOSCOPY EXAM UNDER ANESHESIA;  Surgeon: Ileana Roup, MD;  Location[facility];  Service: General;  Laterality: N/A;  . INSERT / REPLACE / REMOVE PACEMAKER  04/20/2018  . IR RADIOLOGIST EVAL & MGMT  03/25/2017  . LAPAROSCOPIC DIVERTED COLOSTOMY N/A 07/29/2016  Procedure: ATTEMPTED LAPAROSCOPIC ASSISTED COLOSTOMY;  Surgeon: Clovis Riley, MD;  Location: [place];  Service: General;  Laterality: N/A;  . LUNG REMOVAL, PARTIAL Right 2013   RML mass/carcinoid, Dr Lurena Nida  . PACEMAKER IMPLANT N/A 04/20/2018   Procedure: PACEMAKER IMPLANT;  Surgeon: Evans Lance, MD;  Location: [place] CV LAB;  Service: Cardiovascular;  Laterality: N/A;  . TONSILLECTOMY  AND ADENOIDECTOMY  1960s  . VAGINAL HYSTERECTOMY  1959   "partial"    Family History  Problem Relation Age of Onset  . Heart attack Mother        Dec 1987  . CVA Mother   . Hypertension Mother   . Multiple myeloma Mother   . Breast cancer Sister   . Skin cancer Sister   . Prostate cancer Brother   . Throat cancer Brother   . Cervical cancer Daughter   . Leukemia Daughter   . Leukemia Son   . Throat cancer Brother   . Cancer Brother        soft palette  . Heart Problems Brother        Pacemaker  . Colon cancer Neg Hx   . Pancreatic cancer Neg Hx     Social History   Socioeconomic History  . Marital status: Widowed    Spouse name: Not on file  . Number of children: 5  . Years of education: Not on file  . Highest education level: Not on file  Occupational History  . Occupation: retired  Scientific laboratory technician  . Financial resource strain: Not on file  . Food insecurity:    Worry: Not on file    Inability: Not on file  . Transportation needs:    Medical: Not on file    Non-medical: Not on file  Tobacco Use  . Smoking status: Former Smoker    Packs/day: 0.00    Types: Cigarettes  . Smokeless tobacco: Never Used  . Tobacco comment: 04/20/2018 "smoked once in awhile in the 1960s; not more that 4 packs total in my whole life"  Substance and Sexual Activity  . Alcohol use: Never    Frequency: Never  . Drug use: Never  . Sexual activity: Not Currently  Lifestyle  . Physical activity:    Days per week: Not on file    Minutes per session: Not on file  . Stress: Not on file  Relationships  . Social connections:    Talks on phone: Not on file    Gets together: Not on file    Attends religious service: Not on file    Active member of club or organization: Not on file    Attends meetings of clubs or organizations: Not on file    Relationship status: Not on file  . Intimate partner violence:    Fear of current or ex partner: Not on file    Emotionally abused: Not on file     Physically abused: Not on file    Forced sexual activity: Not on file  Other Topics Concern  . Not on file  Social History Narrative  . Not on file     Current Outpatient Medications:  .  acetaminophen (TYLENOL) 500 MG tablet, Take 500 mg by mouth daily as needed for moderate pain., Disp: , Rfl:  .  arformoterol (BROVANA) 15 MCG/2ML NEBU, Take 2 mLs (15 mcg total) by nebulization 2 (two) times daily. Dx copd: J44.9, Disp: 120 mL, Rfl: 5 .  atorvastatin (LIPITOR) 20 MG tablet, TAKE 1 TABLET(20  MG) BY MOUTH AT BEDTIME (Patient taking differently: Take 20 mg by mouth at bedtime. ), Disp: 90 tablet, Rfl: 0 .  Biotin 10000 MCG TABS, Take 10,000 mcg by mouth at bedtime. , Disp: , Rfl:  .  budesonide (PULMICORT) 0.5 MG/2ML nebulizer solution, Take 2 mLs (0.5 mg total) by nebulization 2 (two) times daily. Dx code: j44.9, Disp: 120 mL, Rfl: 5 .  Calcium 600-200 MG-UNIT tablet, Take 1 tablet by mouth at bedtime. , Disp: , Rfl:  .  dofetilide (TIKOSYN) 500 MCG capsule, Take 1 capsule (500 mcg total) by mouth 2 (two) times daily., Disp: 180 capsule, Rfl: 3 .  fenofibrate 160 MG tablet, Take 1 tablet (160 mg total) by mouth daily., Disp: 90 tablet, Rfl: 1 .  ferrous sulfate 325 (65 FE) MG EC tablet, Take 325 mg by mouth 2 (two) times daily., Disp: , Rfl:  .  fluticasone (FLONASE) 50 MCG/ACT nasal spray, Place 1 spray into both nostrils daily., Disp: , Rfl:  .  furosemide (LASIX) 20 MG tablet, TAKE 1 TABLET(20 MG) BY MOUTH DAILY, Disp: 90 tablet, Rfl: 0 .  ipratropium-albuterol (DUONEB) 0.5-2.5 (3) MG/3ML SOLN, Take 3 mLs by nebulization 2 (two) times daily., Disp: 540 mL, Rfl: 0 .  levocetirizine (XYZAL) 5 MG tablet, Take 5 mg by mouth at bedtime., Disp: , Rfl: 6 .  MAGNESIUM-OXIDE 400 (241.3 Mg) MG tablet, TAKE 1 TABLET BY MOUTH ONCE A DAY (Patient taking differently: Take 400 mg by mouth daily. ), Disp: 90 tablet, Rfl: 3 .  montelukast (SINGULAIR) 10 MG tablet, Take 1 tablet (10 mg total) by mouth at  bedtime. Must see provider for future refills, Disp: 30 tablet, Rfl: 0 .  Ostomy Supplies KIT, 8514 is number on bag one piece unit. Colostomy bag and adhesive and alcohol prep, no sting, cannot use regular prep pad., Disp: 30 each, Rfl: 11 .  pantoprazole (PROTONIX) 40 MG tablet, Take 1 tablet (40 mg total) by mouth daily. Follow-up appt is due for future refills, Disp: 90 tablet, Rfl: 1 .  PARoxetine (PAXIL) 20 MG tablet, TAKE 1 TABLET(20 MG) BY MOUTH DAILY (Patient taking differently: Take 20 mg by mouth daily. ), Disp: 90 tablet, Rfl: 0 .  vitamin B-12 (CYANOCOBALAMIN) 1000 MCG tablet, Take 1,000 mcg by mouth at bedtime. , Disp: , Rfl:  .  Vitamin D, Ergocalciferol, (DRISDOL) 50000 units CAPS capsule, TAKE 1 CAPSULE BY MOUTH 1 TIME A WEEK (Patient taking differently: Take 50,000 Units by mouth every Monday. ), Disp: 12 capsule, Rfl: 0 No current facility-administered medications for this visit.   Facility-Administered Medications Ordered in Other Visits:  .  sodium phosphate (FLEET) 7-19 GM/118ML enema 2 enema, 2 enema, Rectal, Once, White, Sharon Mt, MD  Allergies  Allergen Reactions  . Penicillins Other (See Comments)    Reaction:  Unknown  Has patient had a PCN reaction causing immediate rash, facial/tongue/throat swelling, SOB or lightheadedness with hypotension: Unsure Has patient had a PCN reaction causing severe rash involving mucus membranes or skin necrosis: Unsure Has patient had a PCN reaction that required hospitalization Unsure Has patient had a PCN reaction occurring within the last 10 years: Unsure If all of the above answers are "NO", then may proceed with Cephalosporin use.   [name] [Naproxen Sodium] Hives  . Antihistamines, Chlorpheniramine-Type Rash  . Aspirin Rash  . Benadryl [Diphenhydramine Hcl] Palpitations and Other (See Comments)    Increases heart rate  . Codeine Rash  . Hydrochlorothiazide Rash  . Rocephin [employer]  Sodium In Dextrose] Rash  . Sulfa  Antibiotics Rash     ROS See HPI  Objective  Vitals:   05/04/18 1502  BP: 124/60  Pulse: 64  SpO2: 96%  Weight: 230 lb (104.3 kg)  Height: 5' 4" (1.626 m)    Body mass index is 39.48 kg/m.  Physical Exam Vital signs reviewed. Constitutional: Patient appears well-developed and well-nourished. No distress.  HENT: Head: Normocephalic and atraumatic.  Nose: Nose normal. Mouth/Throat: Oropharynx is clear and moist. No oropharyngeal exudate.  Eyes: Conjunctivae and EOM are normal. Pupils are equal, round, and reactive to light. No scleral icterus.  Neck: Normal range of motion. Neck supple.  Cardiovascular: Normal rate, regular rhythm and normal heart sounds.  No murmur heard. No BLE edema. Distal pulses intact. Pulmonary/Chest: Effort normal and breath sounds normal. No respiratory distress. Musculoskeletal: Normal range of motion, No gross deformities Neurological: She is alert and oriented to person, place, and time. No cranial nerve deficit. Coordination, balance, strength, speech and gait are normal.  Skin: Skin is warm and dry. No rash noted. No erythema.  Psychiatric: Patient has a normal mood and affect. behavior is normal. Judgment and thought content normal.   Assessment & Plan RTC in 6 months for routine follow up of chronic conditions 12/30/17 CMET with normal liver function, electrolytes 04/10/18 CBC w/diff stable  Sinus bradycardia Stable Pacemaker now Continue f/u with cardiology as instructed

## 2018-05-07 ENCOUNTER — Other Ambulatory Visit: Payer: Self-pay

## 2018-05-07 DIAGNOSIS — E782 Mixed hyperlipidemia: Secondary | ICD-10-CM

## 2018-05-07 NOTE — Progress Notes (Signed)
Error/thx dmf

## 2018-05-11 ENCOUNTER — Telehealth: Payer: Self-pay | Admitting: Nurse Practitioner

## 2018-05-11 NOTE — Telephone Encounter (Signed)
Copied from [place] [phone]. Topic: Quick Communication - See Telephone Encounter >> May 11, 2018  4:18 PM Berneta Levins wrote: CRM for notification. See Telephone encounter for: 05/11/18.  Pt's daughter, Collie Siad, calling in:  States pt is hurting with urination and has chills. Pt is prone to UTIs and they would like to know if a script for the powder medication can just be called in for her.  Pt uses [facility], [address] AT [phone] (Phone)  [phone] (Fax). Collie Siad can be reached at [phone]

## 2018-05-11 NOTE — Telephone Encounter (Signed)
I am not sure what powder medication is used to treat UTI? Please have them schedule OV for further evaluation of her symtpoms

## 2018-05-12 ENCOUNTER — Ambulatory Visit (HOSPITAL_COMMUNITY): Payer: Medicare Other

## 2018-05-12 ENCOUNTER — Ambulatory Visit: Payer: Medicare Other | Admitting: Nurse Practitioner

## 2018-05-12 DIAGNOSIS — R300 Dysuria: Secondary | ICD-10-CM | POA: Diagnosis not present

## 2018-05-12 DIAGNOSIS — R3129 Other microscopic hematuria: Secondary | ICD-10-CM | POA: Diagnosis not present

## 2018-05-12 NOTE — Telephone Encounter (Signed)
Daughter took UA to her OBGYN and will go through them

## 2018-05-18 ENCOUNTER — Ambulatory Visit: Payer: Medicare Other | Admitting: Nurse Practitioner

## 2018-05-18 ENCOUNTER — Ambulatory Visit (HOSPITAL_COMMUNITY)
Admission: RE | Admit: 2018-05-18 | Discharge: 2018-05-18 | Disposition: A | Discharge status: Home / Self Care | Payer: Medicare Other | Source: Ambulatory Visit | Attending: Pulmonary Disease | Admitting: Pulmonary Disease

## 2018-05-18 DIAGNOSIS — I2510 Atherosclerotic heart disease of native coronary artery without angina pectoris: Secondary | ICD-10-CM | POA: Diagnosis not present

## 2018-05-18 DIAGNOSIS — R918 Other nonspecific abnormal finding of lung field: Secondary | ICD-10-CM

## 2018-05-18 DIAGNOSIS — I700 Atherosclerosis of aorta: Secondary | ICD-10-CM | POA: Insufficient documentation

## 2018-05-25 ENCOUNTER — Other Ambulatory Visit: Payer: Self-pay | Admitting: *Deleted

## 2018-05-25 DIAGNOSIS — J42 Unspecified chronic bronchitis: Secondary | ICD-10-CM

## 2018-05-25 MED ORDER — MONTELUKAST SODIUM 10 MG PO TABS: 10.0000 mg | ORAL_TABLET | Freq: Every day | ORAL | 0 refills | Status: DC

## 2018-05-25 MED ORDER — MONTELUKAST SODIUM 10 MG PO TABS: 10.0000 mg | ORAL_TABLET | Freq: Every day | ORAL | 2 refills | Status: DC

## 2018-05-25 NOTE — Addendum Note (Signed)
Addended by: Cresenciano Lick on: 05/25/2018 02:37 PM   Modules accepted: Orders

## 2018-06-08 ENCOUNTER — Other Ambulatory Visit: Payer: Self-pay | Admitting: *Deleted

## 2018-06-08 DIAGNOSIS — F4323 Adjustment disorder with mixed anxiety and depressed mood: Secondary | ICD-10-CM

## 2018-06-08 MED ORDER — PAROXETINE HCL 20 MG PO TABS: ORAL_TABLET | ORAL | 0 refills | Status: DC

## 2018-06-10 DIAGNOSIS — R300 Dysuria: Secondary | ICD-10-CM | POA: Diagnosis not present

## 2018-06-10 DIAGNOSIS — R3129 Other microscopic hematuria: Secondary | ICD-10-CM | POA: Diagnosis not present

## 2018-07-04 ENCOUNTER — Other Ambulatory Visit: Payer: Self-pay | Admitting: Nurse Practitioner

## 2018-07-04 DIAGNOSIS — E782 Mixed hyperlipidemia: Secondary | ICD-10-CM

## 2018-07-06 DIAGNOSIS — H353111 Nonexudative age-related macular degeneration, right eye, early dry stage: Secondary | ICD-10-CM | POA: Diagnosis not present

## 2018-07-18 ENCOUNTER — Other Ambulatory Visit: Payer: Self-pay | Admitting: Nurse Practitioner

## 2018-07-18 ENCOUNTER — Other Ambulatory Visit: Payer: Self-pay | Admitting: Hematology and Oncology

## 2018-07-18 DIAGNOSIS — I872 Venous insufficiency (chronic) (peripheral): Secondary | ICD-10-CM

## 2018-07-20 ENCOUNTER — Ambulatory Visit (INDEPENDENT_AMBULATORY_CARE_PROVIDER_SITE_OTHER): Payer: Medicare Other | Admitting: Internal Medicine

## 2018-07-20 ENCOUNTER — Encounter: Payer: Self-pay | Admitting: Internal Medicine

## 2018-07-20 VITALS — BP 154/70 | HR 63 | Ht 64.0 in | Wt 234.0 lb

## 2018-07-20 DIAGNOSIS — I5032 Chronic diastolic (congestive) heart failure: Secondary | ICD-10-CM | POA: Diagnosis not present

## 2018-07-20 DIAGNOSIS — Z950 Presence of cardiac pacemaker: Secondary | ICD-10-CM | POA: Diagnosis not present

## 2018-07-20 DIAGNOSIS — I495 Sick sinus syndrome: Secondary | ICD-10-CM | POA: Diagnosis not present

## 2018-07-20 DIAGNOSIS — I480 Paroxysmal atrial fibrillation: Secondary | ICD-10-CM | POA: Diagnosis not present

## 2018-07-20 NOTE — Patient Instructions (Signed)
Medication Instructions:  Your physician recommends that you continue on your current medications as directed. Please refer to the Current Medication list given to you today.  Labwork: None ordered.  Testing/Procedures: None ordered.  Follow-Up: Your physician wants you to follow-up in: 9 months with Dr. Lovena Le.   You will receive a reminder letter in the mail two months in advance. If you don't receive a letter, please call our office to schedule the follow-up appointment.  Remote monitoring is used to monitor your Pacemaker from home. This monitoring reduces the number of office visits required to check your device to one time per year. It allows [place] to keep an eye on the functioning of your device to ensure it is working properly. You are scheduled for a device check from home on 10/19/2018. You may send your transmission at any time that day. If you have a wireless device, the transmission will be sent automatically. After your physician reviews your transmission, you will receive a postcard with your next transmission date.  Any Other Special Instructions Will Be Listed Below (If Applicable).  If you need a refill on your cardiac medications before your next appointment, please call your pharmacy.

## 2018-07-20 NOTE — Progress Notes (Signed)
HPI Tricia Hernandez returns today for follow up of PAF and sinus node dysfunction. She has had progressively symptomatic sinus bradycardia with HR's in the 30's and at times lower. She has not had frank syncope but multiple dizzy spells. No chest pain or sob.  Allergies  Allergen Reactions  . Penicillins Other (See Comments)    Reaction:  Unknown  Has patient had a PCN reaction causing immediate rash, facial/tongue/throat swelling, SOB or lightheadedness with hypotension: Unsure Has patient had a PCN reaction causing severe rash involving mucus membranes or skin necrosis: Unsure Has patient had a PCN reaction that required hospitalization Unsure Has patient had a PCN reaction occurring within the last 10 years: Unsure If all of the above answers are "NO", then may proceed with Cephalosporin use.   [name] [Naproxen Sodium] Hives  . Antihistamines, Chlorpheniramine-Type Rash  . Aspirin Rash  . Benadryl [Diphenhydramine Hcl] Palpitations and Other (See Comments)    Increases heart rate  . Codeine Rash  . Hydrochlorothiazide Rash  . Rocephin [Ceftriaxone Sodium In Dextrose] Rash  . Sulfa Antibiotics Rash     Current Outpatient Medications  Medication Sig Dispense Refill  . acetaminophen (TYLENOL) 500 MG tablet Take 500 mg by mouth daily as needed for moderate pain.    Marland Kitchen arformoterol (BROVANA) 15 MCG/2ML NEBU Take 2 mLs (15 mcg total) by nebulization 2 (two) times daily. Dx copd: J44.9 120 mL 5  . atorvastatin (LIPITOR) 20 MG tablet TAKE 1 TABLET BY MOUTH EVERY NIGHT AT BEDTIME 90 tablet 0  . Biotin 10000 MCG TABS Take 10,000 mcg by mouth at bedtime.     . budesonide (PULMICORT) 0.5 MG/2ML nebulizer solution Take 2 mLs (0.5 mg total) by nebulization 2 (two) times daily. Dx code: j44.9 120 mL 5  . Calcium 600-200 MG-UNIT tablet Take 1 tablet by mouth at bedtime.     . dofetilide (TIKOSYN) 500 MCG capsule Take 1 capsule (500 mcg total) by mouth 2 (two) times daily. 180 capsule 3  .  fenofibrate 160 MG tablet Take 1 tablet (160 mg total) by mouth daily. 90 tablet 1  . ferrous sulfate 325 (65 FE) MG EC tablet Take 325 mg by mouth 2 (two) times daily.    . fluticasone (FLONASE) 50 MCG/ACT nasal spray Place 1 spray into both nostrils daily.    . furosemide (LASIX) 20 MG tablet TAKE 1 TABLET(20 MG) BY MOUTH DAILY 90 tablet 0  . ipratropium-albuterol (DUONEB) 0.5-2.5 (3) MG/3ML SOLN Take 3 mLs by nebulization 2 (two) times daily. 540 mL 0  . levocetirizine (XYZAL) 5 MG tablet Take 5 mg by mouth at bedtime.  6  . MAGNESIUM-OXIDE 400 (241.3 Mg) MG tablet TAKE 1 TABLET BY MOUTH ONCE A DAY (Patient taking differently: Take 400 mg by mouth daily. ) 90 tablet 3  . montelukast (SINGULAIR) 10 MG tablet Take 1 tablet (10 mg total) by mouth at bedtime. 90 tablet 0  . Ostomy Supplies KIT 8514 is number on bag one piece unit. Colostomy bag and adhesive and alcohol prep, no sting, cannot use regular prep pad. 30 each 11  . pantoprazole (PROTONIX) 40 MG tablet Take 1 tablet (40 mg total) by mouth daily. Follow-up appt is due for future refills 90 tablet 1  . pantoprazole (PROTONIX) 40 MG tablet TAKE 1 TABLET(40 MG) BY MOUTH DAILY 90 tablet 0  . PARoxetine (PAXIL) 20 MG tablet TAKE 1 TABLET(20 MG) BY MOUTH DAILY 90 tablet 0  . vitamin B-12 (CYANOCOBALAMIN)  1000 MCG tablet Take 1,000 mcg by mouth at bedtime.     . Vitamin D, Ergocalciferol, (DRISDOL) 1.25 MG (50000 UT) CAPS capsule TAKE 1 CAPSULE BY MOUTH 1 TIME A WEEK 12 capsule 0   No current facility-administered medications for this visit.    Facility-Administered Medications Ordered in Other Visits  Medication Dose Route Frequency Provider Last Rate Last Dose  . sodium phosphate (FLEET) 7-19 GM/118ML enema 2 enema  2 enema Rectal Once Ileana Roup, MD         Past Medical History:  Diagnosis Date  . 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 Anemia   . Anxiety   . Atrial fibrillation  ([place])   . Basal cell carcinoma of skin of nose   . CHF (congestive heart failure) ([place]) 11/2015  . Chronic bronchitis ([place])   . Chronic depression   . Colostomy in place [facility]) 07/2016  . Diverticular disease   . Fibrocystic disease of breast   . Fracture of shoulder   . GERD (gastroesophageal reflux disease)   . GI bleed 12/10/2016   in setting of no PPI. 5/2 EGD: grade A reflux esophagitis.  Mild, non-hemorrhagic gastritis.  Ablation of 3 Duodenal AVMs  . Herpes zoster   . History of blood transfusion    for vaginal, uterine bleeding leading to hysterectomy.  in 12/2016 transfused x 1 for GI bleed.   Marland Kitchen History of hiatal hernia   . Hyperlipidemia   . Hypertension   . Labyrinthitis   . Lung cancer ([place])    RML  . Obesity   . On home oxygen therapy    "3L just at night when I sleep" (04/20/2018)  . Paroxysmal supraventricular tachycardia ([place])   . Peptic ulcer of stomach    hx  . Pneumonia 1980s X 1; 08/2016   "walking; double"  . Presence of permanent cardiac pacemaker 04/20/2018  . Pulmonary embolism ([place]) 05/2014   "left lung"  . Seasonal allergies    "bad" (04/20/2018)  . Vitamin D deficiency     ROS:   All systems reviewed and negative except as noted in the HPI.   Past Surgical History:  Procedure Laterality Date  . APPENDECTOMY  1959  . BASAL CELL CARCINOMA EXCISION     nose  . BREAST CYST ASPIRATION Right 2016?  Marland Kitchen CATARACT EXTRACTION W/ INTRAOCULAR LENS  IMPLANT, BILATERAL Bilateral 1990s  . CHOLECYSTECTOMY OPEN  1980s  . COLON SURGERY    . COLOSTOMY N/A 07/29/2016   Procedure: COLOSTOMY;  Surgeon: Clovis Riley, MD;  Location: [place];  Service: General;  Laterality: N/A;  . DILATION AND CURETTAGE OF UTERUS    . ESOPHAGOGASTRODUODENOSCOPY N/A 12/11/2016   Procedure: ESOPHAGOGASTRODUODENOSCOPY (EGD);  Surgeon: Jerene Bears, MD;  Location: [facility] ENDOSCOPY;  Service: Endoscopy;  Laterality: N/A;  . EVALUATION UNDER ANESTHESIA WITH HEMORRHOIDECTOMY AND PROCTOSCOPY  N/A 07/25/2016   Procedure: EXAM UNDER ANESTHESIA, EXCISION PERIANAL MASS.;  Surgeon: Judeth Horn, MD;  Location: [place];  Service: General;  Laterality: N/A;  Prone position  . FLEXIBLE SIGMOIDOSCOPY N/A 06/16/2017   Procedure: FLEXIBLE SIGMOIDOSCOPY EXAM UNDER ANESHESIA;  Surgeon: Ileana Roup, MD;  Location[facility]OS[facility];  Service: General;  Laterality: N/A;  . INSERT / REPLACE / REMOVE PACEMAKER  04/20/2018  . IR RADIOLOGIST EVAL & MGMT  03/25/2017  . LAPAROSCOPIC DIVERTED COLOSTOMY N/A 07/29/2016   Procedure: ATTEMPTED LAPAROSCOPIC ASSISTED COLOSTOMY;  Surgeon: Clovis Riley,  MD;  Location: [place];  Service: General;  Laterality: N/A;  . LUNG REMOVAL, PARTIAL Right 2013   RML mass/carcinoid, Dr Lurena Nida  . PACEMAKER IMPLANT N/A 04/20/2018   Procedure: PACEMAKER IMPLANT;  Surgeon: Evans Lance, MD;  Location: [place] CV LAB;  Service: Cardiovascular;  Laterality: N/A;  . TONSILLECTOMY AND ADENOIDECTOMY  1960s  . VAGINAL HYSTERECTOMY  1959   "partial"     Family History  Problem Relation Age of Onset  . Heart attack Mother        Dec 1987  . CVA Mother   . Hypertension Mother   . Multiple myeloma Mother   . Breast cancer Sister   . Skin cancer Sister   . Prostate cancer Brother   . Throat cancer Brother   . Cervical cancer Daughter   . Leukemia Daughter   . Leukemia Son   . Throat cancer Brother   . Cancer Brother        soft palette  . Heart Problems Brother        Pacemaker  . Colon cancer Neg Hx   . Pancreatic cancer Neg Hx      Social History   Socioeconomic History  . Marital status: Widowed    Spouse name: Not on file  . Number of children: 5  . Years of education: Not on file  . Highest education level: Not on file  Occupational History  . Occupation: retired  Scientific laboratory technician  . Financial resource strain: Not on file  . Food insecurity:    Worry: Not on file    Inability: Not on file  . Transportation needs:    Medical: Not on file     Non-medical: Not on file  Tobacco Use  . Smoking status: Former Smoker    Packs/day: 0.00    Types: Cigarettes  . Smokeless tobacco: Never Used  . Tobacco comment: 04/20/2018 "smoked once in awhile in the 1960s; not more that 4 packs total in my whole life"  Substance and Sexual Activity  . Alcohol use: Never    Frequency: Never  . Drug use: Never  . Sexual activity: Not Currently  Lifestyle  . Physical activity:    Days per week: Not on file    Minutes per session: Not on file  . Stress: Not on file  Relationships  . Social connections:    Talks on phone: Not on file    Gets together: Not on file    Attends religious service: Not on file    Active member of club or organization: Not on file    Attends meetings of clubs or organizations: Not on file    Relationship status: Not on file  . Intimate partner violence:    Fear of current or ex partner: Not on file    Emotionally abused: Not on file    Physically abused: Not on file    Forced sexual activity: Not on file  Other Topics Concern  . Not on file  Social History Narrative  . Not on file     BP (!) 154/70   Pulse 63   Ht _0  (1.626 m)   Wt 234 lb (106.1 kg)   BMI 40.17 kg/m   Physical Exam:  Well appearing overweight woman, NAD HEENT: Unremarkable Neck:  6 cm JVD, no thyromegally Lymphatics:  No adenopathy Back:  No CVA tenderness Lungs:  Clear with no wheezes HEART:  Regular rate rhythm, no murmurs, no rubs, no clicks Abd:  soft, positive bowel sounds, no organomegally, no rebound, no guarding Ext:  2 plus pulses, no edema, no cyanosis, no clubbing Skin:  No rashes no nodules Neuro:  CN II through XII intact, motor grossly intact  DEVICE  Normal device function.  See PaceArt for details.   Assess/Plan: 1. PAF - she is maintaining NSR. 2. Sinus node dysfunction - she is doing well, s/p PPM insertion.  3. Obesity - she is encouraged to lose weight.   Mikle Bosworth.D.

## 2018-07-22 ENCOUNTER — Telehealth: Payer: Self-pay

## 2018-07-22 NOTE — Telephone Encounter (Signed)
**Note De-identified  Obfuscation** Error

## 2018-07-23 ENCOUNTER — Telehealth: Payer: Self-pay | Admitting: Internal Medicine

## 2018-07-23 NOTE — Telephone Encounter (Signed)
New Message    Pt c/o medication issue:  1. Name of Medication: dofetilide (TIKOSYN) 500 MCG capsule 2 doses of tikosyn the brand instead of generic in the hospital and wants reimberstment   2. How are you currently taking this medication (dosage and times per day)? Take 1 capsule (500 mcg total) by mouth 2 (two) times daily  3. Are you having a reaction (difficulty breathing--STAT)? no  4. What is your medication issue? States pt was given 2 doses of the brand Tikosyn in the hospital instead of the generic which is what she normally takes and the pt is seeking reimbursement. Please call

## 2018-07-24 NOTE — Telephone Encounter (Signed)
[facility].  Per [facility] rep, this case has already been resolved and Pt reimbursed for any out of pocket costs for Tikosyn given in the hospital.  No further action required.

## 2018-07-27 ENCOUNTER — Other Ambulatory Visit: Payer: Self-pay | Admitting: Hematology and Oncology

## 2018-07-28 ENCOUNTER — Telehealth: Payer: Self-pay

## 2018-07-28 NOTE — Telephone Encounter (Signed)
Received notification from [facility] that [place] has been approved through [place] from 04/20/18-07/23/2019.

## 2018-08-01 DIAGNOSIS — Z86711 Personal history of pulmonary embolism: Secondary | ICD-10-CM | POA: Diagnosis not present

## 2018-08-01 DIAGNOSIS — N390 Urinary tract infection, site not specified: Secondary | ICD-10-CM | POA: Diagnosis not present

## 2018-08-01 DIAGNOSIS — I4891 Unspecified atrial fibrillation: Secondary | ICD-10-CM | POA: Diagnosis not present

## 2018-08-01 DIAGNOSIS — R1010 Upper abdominal pain, unspecified: Secondary | ICD-10-CM | POA: Diagnosis not present

## 2018-08-01 DIAGNOSIS — Z933 Colostomy status: Secondary | ICD-10-CM | POA: Diagnosis not present

## 2018-08-01 DIAGNOSIS — I10 Essential (primary) hypertension: Secondary | ICD-10-CM | POA: Diagnosis not present

## 2018-08-01 DIAGNOSIS — Z886 Allergy status to analgesic agent status: Secondary | ICD-10-CM | POA: Diagnosis not present

## 2018-08-01 DIAGNOSIS — R935 Abnormal findings on diagnostic imaging of other abdominal regions, including retroperitoneum: Secondary | ICD-10-CM | POA: Diagnosis not present

## 2018-08-01 DIAGNOSIS — R109 Unspecified abdominal pain: Secondary | ICD-10-CM | POA: Diagnosis not present

## 2018-08-01 DIAGNOSIS — Z888 Allergy status to other drugs, medicaments and biological substances status: Secondary | ICD-10-CM | POA: Diagnosis not present

## 2018-08-01 DIAGNOSIS — Z882 Allergy status to sulfonamides status: Secondary | ICD-10-CM | POA: Diagnosis not present

## 2018-08-01 DIAGNOSIS — J449 Chronic obstructive pulmonary disease, unspecified: Secondary | ICD-10-CM | POA: Diagnosis not present

## 2018-08-01 DIAGNOSIS — E7800 Pure hypercholesterolemia, unspecified: Secondary | ICD-10-CM | POA: Diagnosis not present

## 2018-08-01 LAB — CUP PACEART INCLINIC DEVICE CHECK
Implantable Lead Implant Date: 20190909
Implantable Lead Location: 753860
Implantable Pulse Generator Implant Date: 20190909
MDC IDC LEAD IMPLANT DT: 20190909
MDC IDC LEAD LOCATION: 753859
MDC IDC SESS DTM: 20191221163529
Pulse Gen Model: 2272
Pulse Gen Serial Number: 9061801

## 2018-08-11 DIAGNOSIS — R05 Cough: Secondary | ICD-10-CM | POA: Diagnosis not present

## 2018-08-11 DIAGNOSIS — J441 Chronic obstructive pulmonary disease with (acute) exacerbation: Secondary | ICD-10-CM | POA: Diagnosis not present

## 2018-08-17 ENCOUNTER — Emergency Department (HOSPITAL_COMMUNITY)
Admission: EM | Admit: 2018-08-17 | Discharge: 2018-08-17 | Disposition: A | Discharge status: Home / Self Care | Payer: Medicare Other | Attending: Emergency Medicine | Admitting: Emergency Medicine

## 2018-08-17 ENCOUNTER — Emergency Department (HOSPITAL_COMMUNITY): Payer: Medicare Other

## 2018-08-17 ENCOUNTER — Encounter (HOSPITAL_COMMUNITY): Payer: Self-pay | Admitting: *Deleted

## 2018-08-17 ENCOUNTER — Other Ambulatory Visit: Payer: Self-pay

## 2018-08-17 DIAGNOSIS — R0602 Shortness of breath: Secondary | ICD-10-CM | POA: Diagnosis not present

## 2018-08-17 DIAGNOSIS — I110 Hypertensive heart disease with heart failure: Secondary | ICD-10-CM | POA: Insufficient documentation

## 2018-08-17 DIAGNOSIS — Z79899 Other long term (current) drug therapy: Secondary | ICD-10-CM | POA: Insufficient documentation

## 2018-08-17 DIAGNOSIS — I509 Heart failure, unspecified: Secondary | ICD-10-CM | POA: Diagnosis not present

## 2018-08-17 DIAGNOSIS — J209 Acute bronchitis, unspecified: Secondary | ICD-10-CM | POA: Diagnosis not present

## 2018-08-17 DIAGNOSIS — Z87891 Personal history of nicotine dependence: Secondary | ICD-10-CM | POA: Insufficient documentation

## 2018-08-17 DIAGNOSIS — R05 Cough: Secondary | ICD-10-CM | POA: Diagnosis not present

## 2018-08-17 LAB — CBC WITH DIFFERENTIAL/PLATELET
Abs Immature Granulocytes: 0.1 10*3/uL — ABNORMAL HIGH (ref 0.00–0.07)
BASOS ABS: 0 10*3/uL (ref 0.0–0.1)
Basophils Relative: 0 %
EOS ABS: 0.2 10*3/uL (ref 0.0–0.5)
EOS PCT: 3 %
HEMATOCRIT: 36.7 % (ref 36.0–46.0)
Hemoglobin: 11.2 g/dL — ABNORMAL LOW (ref 12.0–15.0)
Immature Granulocytes: 2 %
Lymphocytes Relative: 13 %
Lymphs Abs: 0.8 10*3/uL (ref 0.7–4.0)
MCH: 25.9 pg — AB (ref 26.0–34.0)
MCHC: 30.5 g/dL (ref 30.0–36.0)
MCV: 85 fL (ref 80.0–100.0)
Monocytes Absolute: 0.5 10*3/uL (ref 0.1–1.0)
Monocytes Relative: 9 %
NRBC: 0 % (ref 0.0–0.2)
Neutro Abs: 4.6 10*3/uL (ref 1.7–7.7)
Neutrophils Relative %: 73 %
Platelets: 168 10*3/uL (ref 150–400)
RBC: 4.32 MIL/uL (ref 3.87–5.11)
RDW: 15.1 % (ref 11.5–15.5)
WBC: 6.3 10*3/uL (ref 4.0–10.5)

## 2018-08-17 LAB — BASIC METABOLIC PANEL
Anion gap: 8 (ref 5–15)
BUN: 22 mg/dL (ref 8–23)
CO2: 30 mmol/L (ref 22–32)
CREATININE: 1.11 mg/dL — AB (ref 0.44–1.00)
Calcium: 9.1 mg/dL (ref 8.9–10.3)
Chloride: 100 mmol/L (ref 98–111)
GFR calc non Af Amer: 45 mL/min — ABNORMAL LOW (ref >60)
GFR, EST AFRICAN AMERICAN: 52 mL/min — AB (ref >60)
GLUCOSE: 104 mg/dL — AB (ref 70–99)
Potassium: 4.2 mmol/L (ref 3.5–5.1)
Sodium: 138 mmol/L (ref 135–145)

## 2018-08-17 NOTE — ED Provider Notes (Signed)
[facility] EMERGENCY DEPARTMENT Provider Note   CSN: [id number] Arrival date & time: 08/17/18  0854     History   Chief Complaint Chief Complaint  Patient presents with  . Cough    HPI Tricia Hernandez is a 85 y.o. female.  HPI Patient is an 85 year old female who was treated last week by an urgent care in [place] for bronchitis/developing pneumonia.  She was placed on steroids and doxycycline.  She finished her course of antibiotics yesterday.  She reports no significant shortness of breath.  She feels like she is improving.  She wanted to be "rechecked".  Her cough has improved although is still present.  She has no exertional shortness of breath.  She has been eating and drinking normally.  No sick contacts.  No significant headache.  Daughter agrees the patient seems to be improving.   Past Medical History:  Diagnosis Date  . 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 Anemia   . Anxiety   . Atrial fibrillation ([place])   . Basal cell carcinoma of skin of nose   . CHF (congestive heart failure) ([place]) 11/2015  . Chronic bronchitis ([place])   . Chronic depression   . Colostomy in place [facility]) 07/2016  . Diverticular disease   . Fibrocystic disease of breast   . Fracture of shoulder   . GERD (gastroesophageal reflux disease)   . GI bleed 12/10/2016   in setting of no PPI. 5/2 EGD: grade A reflux esophagitis.  Mild, non-hemorrhagic gastritis.  Ablation of 3 Duodenal AVMs  . Herpes zoster   . History of blood transfusion    for vaginal, uterine bleeding leading to hysterectomy.  in 12/2016 transfused x 1 for GI bleed.   Marland Kitchen History of hiatal hernia   . Hyperlipidemia   . Hypertension   . Labyrinthitis   . Lung cancer ([place])    RML  . Obesity   . On home oxygen therapy    "3L just at night when I sleep" (04/20/2018)  . Paroxysmal supraventricular tachycardia ([place])   . Peptic ulcer of stomach    hx  . Pneumonia 1980s X  1; 08/2016   "walking; double"  . Presence of permanent cardiac pacemaker 04/20/2018  . Pulmonary embolism ([place]) 05/2014   "left lung"  . Seasonal allergies    "bad" (04/20/2018)  . Vitamin D deficiency     Patient Active Problem List   Diagnosis Date Noted  . Postsurgical cardiac pacemaker in situ 04/20/2018  . Sinus node dysfunction ([place]) 04/14/2018  . Vitamin D deficiency 05/27/2017  . Venous stasis dermatitis of both lower extremities 02/23/2017  . Anemia 02/22/2017  . Acid reflux 02/22/2017  . Colostomy care ([place]) 12/20/2016  . Angiodysplasia of duodenum   . GI bleeding 12/10/2016  . Mixed hyperlipidemia 11/05/2016  . Chronic bronchitis ([place]) 11/04/2016  . Persistent atrial fibrillation 10/07/2016  . Acute respiratory failure with hypoxia ([place]) 08/26/2016  . Essential hypertension   . Pulmonary nodules   . Palliative care encounter   . Anal cancer ([place]) 07/22/2016  . Atrial fibrillation ([place]), CHA2DS2-VASc Score 5 07/22/2016  . Pulmonary embolus ([place])   . Obesity   . Lung cancer [facility])     Past Surgical History:  Procedure Laterality Date  . APPENDECTOMY  1959  . BASAL CELL CARCINOMA EXCISION     nose  . BREAST CYST ASPIRATION Right 2016?  Marland Kitchen CATARACT EXTRACTION W/ INTRAOCULAR  LENS  IMPLANT, BILATERAL Bilateral 1990s  . CHOLECYSTECTOMY OPEN  1980s  . COLON SURGERY    . COLOSTOMY N/A 07/29/2016   Procedure: COLOSTOMY;  Surgeon: Clovis Riley, MD;  Location: [facility];  Service: General;  Laterality: N/A;  . DILATION AND CURETTAGE OF UTERUS    . ESOPHAGOGASTRODUODENOSCOPY N/A 12/11/2016   Procedure: ESOPHAGOGASTRODUODENOSCOPY (EGD);  Surgeon: Jerene Bears, MD;  Location: [facility] ENDOSCOPY;  Service: Endoscopy;  Laterality: N/A;  . EVALUATION UNDER ANESTHESIA WITH HEMORRHOIDECTOMY AND PROCTOSCOPY N/A 07/25/2016   Procedure: EXAM UNDER ANESTHESIA, EXCISION PERIANAL MASS.;  Surgeon: Judeth Horn, MD;  Location: [facility];  Service: General;  Laterality: N/A;  Prone position  .  FLEXIBLE SIGMOIDOSCOPY N/A 06/16/2017   Procedure: FLEXIBLE SIGMOIDOSCOPY EXAM UNDER ANESHESIA;  Surgeon: Ileana Roup, MD;  Location[facility];  Service: General;  Laterality: N/A;  . INSERT / REPLACE / REMOVE PACEMAKER  04/20/2018  . IR RADIOLOGIST EVAL & MGMT  03/25/2017  . LAPAROSCOPIC DIVERTED COLOSTOMY N/A 07/29/2016   Procedure: ATTEMPTED LAPAROSCOPIC ASSISTED COLOSTOMY;  Surgeon: Clovis Riley, MD;  Location: [facility];  Service: General;  Laterality: N/A;  . LUNG REMOVAL, PARTIAL Right 2013   RML mass/carcinoid, Dr Lurena Nida  . PACEMAKER IMPLANT N/A 04/20/2018   Procedure: PACEMAKER IMPLANT;  Surgeon: Evans Lance, MD;  Location: [facility] CV LAB;  Service: Cardiovascular;  Laterality: N/A;  . TONSILLECTOMY AND ADENOIDECTOMY  1960s  . VAGINAL HYSTERECTOMY  1959   "partial"     OB History   No obstetric history on file.      Home Medications    Prior to Admission medications   Medication Sig Start Date End Date Taking? Authorizing Provider  acetaminophen (TYLENOL) 325 MG tablet Take 650 mg by mouth daily as needed for moderate pain or headache.    Yes [provider]  arformoterol (BROVANA) 15 MCG/2ML NEBU Take 2 mLs (15 mcg total) by nebulization 2 (two) times daily. Dx copd: J44.9 Patient taking differently: Take 15 mcg by nebulization 2 (two) times daily.  02/25/17  Yes Mannam, Praveen, MD  atorvastatin (LIPITOR) 20 MG tablet TAKE 1 TABLET BY MOUTH EVERY NIGHT AT BEDTIME Patient taking differently: Take 20 mg by mouth daily at 6 PM.  07/06/18  Yes Shambley, Delphia Grates, NP  Biotin 10000 MCG TABS Take 10,000 mcg by mouth at bedtime.    Yes [provider]  budesonide (PULMICORT) 0.5 MG/2ML nebulizer solution Take 2 mLs (0.5 mg total) by nebulization 2 (two) times daily. Dx code: j83.9 Patient taking differently: Take 0.5 mg by nebulization 2 (two) times daily.  02/25/17  Yes Mannam, Praveen, MD  Calcium 600-200 MG-UNIT tablet Take 1 tablet by mouth  at bedtime.    Yes [provider]  dofetilide (TIKOSYN) 500 MCG capsule Take 1 capsule (500 mcg total) by mouth 2 (two) times daily. 12/01/17  Yes Evans Lance, MD  fenofibrate 160 MG tablet Take 1 tablet (160 mg total) by mouth daily. 05/01/18  Yes Lance Sell, NP  ferrous sulfate 325 (65 FE) MG EC tablet Take 325 mg by mouth 2 (two) times daily.   Yes [provider]  fluticasone (FLONASE) 50 MCG/ACT nasal spray Place 1 spray into both nostrils daily.   Yes [provider]  furosemide (LASIX) 20 MG tablet TAKE 1 TABLET(20 MG) BY MOUTH DAILY Patient taking differently: Take 20 mg by mouth See admin instructions. Every third day 07/20/18  Yes Lance Sell, NP  ipratropium-albuterol (DUONEB)  0.5-2.5 (3) MG/3ML SOLN Take 3 mLs by nebulization 2 (two) times daily. 11/14/16  Yes Mannam, Praveen, MD  levocetirizine (XYZAL) 5 MG tablet Take 5 mg by mouth at bedtime. 11/06/17  Yes [provider]  MAGNESIUM-OXIDE 400 (241.3 Mg) MG tablet TAKE 1 TABLET BY MOUTH ONCE A DAY Patient taking differently: Take 400 mg by mouth daily.  01/07/18  Yes Evans Lance, MD  montelukast (SINGULAIR) 10 MG tablet Take 1 tablet (10 mg total) by mouth at bedtime. 05/25/18  Yes Lance Sell, NP  pantoprazole (PROTONIX) 40 MG tablet Take 1 tablet (40 mg total) by mouth daily. Follow-up appt is due for future refills Patient taking differently: Take 40 mg by mouth daily.  05/28/17  Yes [name], [name], NP  PARoxetine (PAXIL) 20 MG tablet TAKE 1 TABLET(20 MG) BY MOUTH DAILY Patient taking differently: Take 20 mg by mouth at bedtime.  06/08/18  Yes Lance Sell, NP  vitamin B-12 (CYANOCOBALAMIN) 1000 MCG tablet Take 1,000 mcg by mouth at bedtime.    Yes [provider]  Vitamin D, Ergocalciferol, (DRISDOL) 1.25 MG (50000 UT) CAPS capsule TAKE 1 CAPSULE BY MOUTH 1 TIME A WEEK Patient taking differently: Take 50,000 Units by mouth every 7 (seven) days.  Monday 07/20/18  Yes Nicholas Lose, MD  Ostomy Supplies KIT [phone] is number on bag one piece unit. Colostomy bag and adhesive and alcohol prep, no sting, cannot use regular prep pad. 12/20/16   [name], [name], NP  pantoprazole (PROTONIX) 40 MG tablet TAKE 1 TABLET(40 MG) BY MOUTH DAILY Patient not taking: Reported on 08/17/2018 07/20/18   Lance Sell, NP    Family History Family History  Problem Relation Age of Onset  . Heart attack Mother        Dec 1987  . CVA Mother   . Hypertension Mother   . Multiple myeloma Mother   . Breast cancer Sister   . Skin cancer Sister   . Prostate cancer Brother   . Throat cancer Brother   . Cervical cancer Daughter   . Leukemia Daughter   . Leukemia Son   . Throat cancer Brother   . Cancer Brother        soft palette  . Heart Problems Brother        Pacemaker  . Colon cancer Neg Hx   . Pancreatic cancer Neg Hx     Social History Social History   Tobacco Use  . Smoking status: Former Smoker    Packs/day: 0.00    Types: Cigarettes  . Smokeless tobacco: Never Used  . Tobacco comment: 04/20/2018 "smoked once in awhile in the 1960s; not more that 4 packs total in my whole life"  Substance Use Topics  . Alcohol use: Never    Frequency: Never  . Drug use: Never     Allergies   Penicillins; Aleve [naproxen sodium]; Antihistamines, chlorpheniramine-type; Aspirin; Benadryl [diphenhydramine hcl]; Codeine; Hydrochlorothiazide; Rocephin [ceftriaxone sodium in dextrose]; and Sulfa antibiotics   Review of Systems Review of Systems  All other systems reviewed and are negative.    Physical Exam Updated Vital Signs BP (!) 127/54   Pulse (!) 59   Temp 98.3 F (36.8 C) (Oral)   Resp 20   Ht _0  (1.626 m)   Wt 103 kg   SpO2 98%   BMI 38.96 kg/m   Physical Exam Vitals signs and nursing note reviewed.  Constitutional:      General: She is not in  acute distress.    Appearance: She is well-developed.  HENT:     Head:  Normocephalic and atraumatic.  Neck:     Musculoskeletal: Normal range of motion.  Cardiovascular:     Rate and Rhythm: Normal rate and regular rhythm.     Heart sounds: Normal heart sounds.  Pulmonary:     Effort: Pulmonary effort is normal.     Breath sounds: Normal breath sounds.  Abdominal:     General: There is no distension.     Palpations: Abdomen is soft.     Tenderness: There is no abdominal tenderness.  Musculoskeletal: Normal range of motion.  Skin:    General: Skin is warm and dry.  Neurological:     Mental Status: She is alert and oriented to person, place, and time.  Psychiatric:        Judgment: Judgment normal.      ED Treatments / Results  Labs (all labs ordered are listed, but only abnormal results are displayed) Labs Reviewed  CBC WITH DIFFERENTIAL/PLATELET - Abnormal; Notable for the following components:      Result Value   Hemoglobin 11.2 (*)    MCH 25.9 (*)    Abs Immature Granulocytes 0.10 (*)    All other components within normal limits  BASIC METABOLIC PANEL    EKG None  Radiology Dg Chest 2 View  Result Date: 08/17/2018 CLINICAL DATA:  Shortness of breath, cough EXAM: CHEST - 2 VIEW COMPARISON:  04/21/2018 FINDINGS: There is no focal parenchymal opacity. There is no pleural effusion or pneumothorax. The heart and mediastinal contours are unremarkable. There is a dual lead cardiac pacemaker. The osseous structures are unremarkable. IMPRESSION: No active cardiopulmonary disease. Electronically Signed   By: Kathreen Devoid   On: 08/17/2018 09:49    Procedures Procedures (including critical care time)  Medications Ordered in ED Medications - No data to display   Initial Impression / Assessment and Plan / ED Course  I have reviewed the triage vital signs and the nursing notes.  Pertinent labs & imaging results that were available during my care of the patient were reviewed by me and considered in my medical decision making (see chart for  details).     Clinically it sounds like the patient is improving.  Her chest x-ray is without pneumonia.  Her labs are reassuring.  Her vital signs are stable.  No indication for additional work-up or treatment at this time.  Close primary care follow-up.  She understands to return to the emergency department for new or worsening symptoms  Final Clinical Impressions(s) / ED Diagnoses   Final diagnoses:  None    ED Discharge Orders    None       Jola Schmidt, MD 08/17/18 1121

## 2018-08-17 NOTE — ED Triage Notes (Signed)
PT from home ater being treated at a [facility]  [place]. Pt has had a cough and on Anti-Bx since last tuesday.

## 2018-08-29 ENCOUNTER — Other Ambulatory Visit: Payer: Self-pay | Admitting: Nurse Practitioner

## 2018-08-29 DIAGNOSIS — F4323 Adjustment disorder with mixed anxiety and depressed mood: Secondary | ICD-10-CM

## 2018-09-22 DIAGNOSIS — H903 Sensorineural hearing loss, bilateral: Secondary | ICD-10-CM | POA: Diagnosis not present

## 2018-10-03 ENCOUNTER — Other Ambulatory Visit: Payer: Self-pay | Admitting: Nurse Practitioner

## 2018-10-03 ENCOUNTER — Other Ambulatory Visit: Payer: Self-pay | Admitting: Pulmonary Disease

## 2018-10-03 DIAGNOSIS — E782 Mixed hyperlipidemia: Secondary | ICD-10-CM

## 2018-10-05 ENCOUNTER — Other Ambulatory Visit: Payer: Self-pay | Admitting: Pulmonary Disease

## 2018-10-05 NOTE — Telephone Encounter (Signed)
PT NEEDS APPT FOR FURTHER REFILLS

## 2018-10-12 DIAGNOSIS — N390 Urinary tract infection, site not specified: Secondary | ICD-10-CM | POA: Diagnosis not present

## 2018-10-12 DIAGNOSIS — B9629 Other Escherichia coli [E. coli] as the cause of diseases classified elsewhere: Secondary | ICD-10-CM | POA: Diagnosis not present

## 2018-10-12 DIAGNOSIS — N3946 Mixed incontinence: Secondary | ICD-10-CM | POA: Diagnosis not present

## 2018-10-12 DIAGNOSIS — Z87440 Personal history of urinary (tract) infections: Secondary | ICD-10-CM | POA: Diagnosis not present

## 2018-10-19 ENCOUNTER — Ambulatory Visit (INDEPENDENT_AMBULATORY_CARE_PROVIDER_SITE_OTHER): Payer: Medicare Other | Admitting: *Deleted

## 2018-10-19 DIAGNOSIS — R001 Bradycardia, unspecified: Secondary | ICD-10-CM

## 2018-10-19 DIAGNOSIS — I495 Sick sinus syndrome: Secondary | ICD-10-CM

## 2018-10-20 LAB — CUP PACEART REMOTE DEVICE CHECK
Battery Remaining Longevity: 101 mo
Battery Remaining Percentage: 95.5 %
Battery Voltage: 3.01 V
Brady Statistic RA Percent Paced: 84 %
Brady Statistic RV Percent Paced: 16 %
Implantable Lead Implant Date: 20190909
Implantable Lead Implant Date: 20190909
Implantable Lead Location: 753859
Implantable Lead Location: 753860
Implantable Lead Model: 3830
Implantable Pulse Generator Implant Date: 20190909
Lead Channel Pacing Threshold Amplitude: 0.625 V
Lead Channel Pacing Threshold Amplitude: 0.75 V
Lead Channel Pacing Threshold Pulse Width: 0.5 ms
Lead Channel Sensing Intrinsic Amplitude: 2.4 mV
Lead Channel Setting Pacing Pulse Width: 1 ms
Lead Channel Setting Sensing Sensitivity: 2 mV
MDC IDC MSMT LEADCHNL RA IMPEDANCE VALUE: 380 Ohm
MDC IDC MSMT LEADCHNL RV IMPEDANCE VALUE: 460 Ohm
MDC IDC MSMT LEADCHNL RV PACING THRESHOLD PULSEWIDTH: 1 ms
MDC IDC MSMT LEADCHNL RV SENSING INTR AMPL: 5.6 mV
MDC IDC SESS DTM: 20200309070014
MDC IDC SET LEADCHNL RA PACING AMPLITUDE: 1.625
MDC IDC SET LEADCHNL RV PACING AMPLITUDE: 2.5 V
MDC IDC STAT BRADY AP VP PERCENT: 16 %
MDC IDC STAT BRADY AP VS PERCENT: 73 %
MDC IDC STAT BRADY AS VP PERCENT: 1 %
MDC IDC STAT BRADY AS VS PERCENT: 11 %
Pulse Gen Model: 2272
Pulse Gen Serial Number: 9061801

## 2018-10-27 NOTE — Progress Notes (Signed)
Remote pacemaker transmission.   

## 2018-10-28 ENCOUNTER — Other Ambulatory Visit: Payer: Self-pay | Admitting: Nurse Practitioner

## 2018-10-28 ENCOUNTER — Other Ambulatory Visit: Payer: Self-pay | Admitting: Hematology and Oncology

## 2018-10-28 ENCOUNTER — Telehealth: Payer: Self-pay | Admitting: Pulmonary Disease

## 2018-10-28 ENCOUNTER — Other Ambulatory Visit: Payer: Self-pay | Admitting: Internal Medicine

## 2018-10-28 DIAGNOSIS — E782 Mixed hyperlipidemia: Secondary | ICD-10-CM

## 2018-10-28 NOTE — Telephone Encounter (Signed)
Called Alex, unable to reach [facility]

## 2018-10-29 NOTE — Telephone Encounter (Signed)
[place], [place], [phone] ext [phone].  LMTCB.

## 2018-11-02 ENCOUNTER — Other Ambulatory Visit (INDEPENDENT_AMBULATORY_CARE_PROVIDER_SITE_OTHER): Payer: Medicare Other

## 2018-11-02 ENCOUNTER — Encounter: Payer: Self-pay | Admitting: Nurse Practitioner

## 2018-11-02 ENCOUNTER — Ambulatory Visit (INDEPENDENT_AMBULATORY_CARE_PROVIDER_SITE_OTHER): Payer: Medicare Other | Admitting: Nurse Practitioner

## 2018-11-02 ENCOUNTER — Other Ambulatory Visit: Payer: Self-pay

## 2018-11-02 VITALS — BP 130/78 | HR 60 | Temp 97.7°F | Ht 64.0 in | Wt 234.0 lb

## 2018-11-02 DIAGNOSIS — F419 Anxiety disorder, unspecified: Secondary | ICD-10-CM | POA: Diagnosis not present

## 2018-11-02 DIAGNOSIS — J309 Allergic rhinitis, unspecified: Secondary | ICD-10-CM

## 2018-11-02 DIAGNOSIS — I10 Essential (primary) hypertension: Secondary | ICD-10-CM | POA: Diagnosis not present

## 2018-11-02 DIAGNOSIS — K219 Gastro-esophageal reflux disease without esophagitis: Secondary | ICD-10-CM

## 2018-11-02 DIAGNOSIS — E782 Mixed hyperlipidemia: Secondary | ICD-10-CM | POA: Diagnosis not present

## 2018-11-02 DIAGNOSIS — F32A Depression, unspecified: Secondary | ICD-10-CM | POA: Insufficient documentation

## 2018-11-02 DIAGNOSIS — F329 Major depressive disorder, single episode, unspecified: Secondary | ICD-10-CM

## 2018-11-02 LAB — BASIC METABOLIC PANEL
BUN: 25 mg/dL — AB (ref 6–23)
CALCIUM: 9.2 mg/dL (ref 8.4–10.5)
CHLORIDE: 101 meq/L (ref 96–112)
CO2: 34 meq/L — AB (ref 19–32)
CREATININE: 1.08 mg/dL (ref 0.40–1.20)
GFR: 48.18 mL/min — ABNORMAL LOW (ref >60.00)
Glucose, Bld: 86 mg/dL (ref 70–99)
Potassium: 4.7 mEq/L (ref 3.5–5.1)
Sodium: 140 mEq/L (ref 135–145)

## 2018-11-02 LAB — CBC
HEMATOCRIT: 35.4 % — AB (ref 36.0–46.0)
HEMOGLOBIN: 11.6 g/dL — AB (ref 12.0–15.0)
MCHC: 32.9 g/dL (ref 30.0–36.0)
MCV: 82.5 fl (ref 78.0–100.0)
PLATELETS: 134 10*3/uL — AB (ref 150.0–400.0)
RBC: 4.29 Mil/uL (ref 3.87–5.11)
RDW: 16.5 % — ABNORMAL HIGH (ref 11.5–15.5)
WBC: 5.4 10*3/uL (ref 4.0–10.5)

## 2018-11-02 NOTE — Assessment & Plan Note (Signed)
Stable Continue current medications Continue to monitor - Basic metabolic panel; Future - CBC; Future

## 2018-11-02 NOTE — Assessment & Plan Note (Signed)
Stable Continue paxil at current dosage F/u for new, worsening symptoms - Basic metabolic panel; Future - CBC; Future

## 2018-11-02 NOTE — Progress Notes (Signed)
Tricia Hernandez is a 85 y.o. female with the following history as recorded in EpicCare:  Patient Active Problem List   Diagnosis Date Noted  . Postsurgical cardiac pacemaker in situ 04/20/2018  . Sinus node dysfunction ([place]) 04/14/2018  . Vitamin D deficiency 05/27/2017  . Venous stasis dermatitis of both lower extremities 02/23/2017  . Anemia 02/22/2017  . Acid reflux 02/22/2017  . Colostomy care ([place]) 12/20/2016  . Angiodysplasia of duodenum   . GI bleeding 12/10/2016  . Mixed hyperlipidemia 11/05/2016  . Chronic bronchitis ([place]) 11/04/2016  . Persistent atrial fibrillation 10/07/2016  . Acute respiratory failure with hypoxia ([place]) 08/26/2016  . Essential hypertension   . Pulmonary nodules   . Palliative care encounter   . Anal cancer ([place]) 07/22/2016  . Atrial fibrillation ([place]), CHA2DS2-VASc Score 5 07/22/2016  . Pulmonary embolus ([place])   . Obesity   . Lung cancer [facility])     Current Outpatient Medications  Medication Sig Dispense Refill  . acetaminophen (TYLENOL) 325 MG tablet Take 650 mg by mouth daily as needed for moderate pain or headache.     Marland Kitchen atorvastatin (LIPITOR) 20 MG tablet TAKE 1 TABLET BY MOUTH EVERY NIGHT AT BEDTIME 90 tablet 0  . Biotin 10000 MCG TABS Take 10,000 mcg by mouth at bedtime.     Marland Kitchen BROVANA 15 MCG/2ML NEBU INHALE 1 VIAL BY MOUTH VIA NEBULIZER TWICE DAILY 120 mL 0  . budesonide (PULMICORT) 0.5 MG/2ML nebulizer solution USE 1 VIAL VIA NEBULIZER TWICE DAILY 360 mL 2  . Calcium 600-200 MG-UNIT tablet Take 1 tablet by mouth at bedtime.     . dofetilide (TIKOSYN) 500 MCG capsule TAKE 1 CAPSULE(500 MCG) BY MOUTH TWICE DAILY 180 capsule 2  . fenofibrate 160 MG tablet TAKE 1 TABLET(160 MG) BY MOUTH DAILY 90 tablet 0  . ferrous sulfate 325 (65 FE) MG EC tablet Take 325 mg by mouth 2 (two) times daily.    . fluticasone (FLONASE) 50 MCG/ACT nasal spray Place 1 spray into both nostrils daily.    . furosemide (LASIX) 20 MG tablet TAKE 1 TABLET(20 MG) BY MOUTH  DAILY (Patient taking differently: Take 20 mg by mouth See admin instructions. Every third day) 90 tablet 0  . ipratropium-albuterol (DUONEB) 0.5-2.5 (3) MG/3ML SOLN Take 3 mLs by nebulization 2 (two) times daily. 540 mL 0  . levocetirizine (XYZAL) 5 MG tablet Take 5 mg by mouth at bedtime.  6  . MAGNESIUM-OXIDE 400 (241.3 Mg) MG tablet TAKE 1 TABLET BY MOUTH ONCE A DAY (Patient taking differently: Take 400 mg by mouth daily. ) 90 tablet 3  . montelukast (SINGULAIR) 10 MG tablet Take 1 tablet (10 mg total) by mouth at bedtime. 90 tablet 0  . Ostomy Supplies KIT [id number] is number on bag one piece unit. Colostomy bag and adhesive and alcohol prep, no sting, cannot use regular prep pad. 30 each 11  . pantoprazole (PROTONIX) 40 MG tablet TAKE 1 TABLET(40 MG) BY MOUTH DAILY 90 tablet 0  . PARoxetine (PAXIL) 20 MG tablet TAKE 1 TABLET(20 MG) BY MOUTH DAILY 90 tablet 0  . vitamin B-12 (CYANOCOBALAMIN) 1000 MCG tablet Take 1,000 mcg by mouth at bedtime.     . Vitamin D, Ergocalciferol, (DRISDOL) 1.25 MG (50000 UT) CAPS capsule TAKE 1 CAPSULE BY MOUTH 1 TIME A WEEK 12 capsule 0   No current facility-administered medications for this visit.    Facility-Administered Medications Ordered in Other Visits  Medication Dose Route Frequency Provider Last Rate Last Dose  .  sodium phosphate (FLEET) 7-19 GM/118ML enema 2 enema  2 enema Rectal Once Ileana Roup, MD        Allergies: Penicillins; Aleve [naproxen sodium]; Antihistamines, chlorpheniramine-type; Aspirin; Benadryl [diphenhydramine hcl]; Codeine; Hydrochlorothiazide; Rocephin [ceftriaxone sodium in dextrose]; and Sulfa antibiotics  Past Medical History:  Diagnosis Date  . 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 Anemia   . Anxiety   . Atrial fibrillation ([place])   . Basal cell carcinoma of skin of nose   . CHF (congestive heart failure) ([place]) 11/2015  . Chronic bronchitis ([place])   . Chronic depression    . Colostomy in place [facility]) 07/2016  . Diverticular disease   . Fibrocystic disease of breast   . Fracture of shoulder   . GERD (gastroesophageal reflux disease)   . GI bleed 12/10/2016   in setting of no PPI. 5/2 EGD: grade A reflux esophagitis.  Mild, non-hemorrhagic gastritis.  Ablation of 3 Duodenal AVMs  . Herpes zoster   . History of blood transfusion    for vaginal, uterine bleeding leading to hysterectomy.  in 12/2016 transfused x 1 for GI bleed.   Marland Kitchen History of hiatal hernia   . Hyperlipidemia   . Hypertension   . Labyrinthitis   . Lung cancer ([place])    RML  . Obesity   . On home oxygen therapy    "3L just at night when I sleep" (04/20/2018)  . Paroxysmal supraventricular tachycardia ([place])   . Peptic ulcer of stomach    hx  . Pneumonia 1980s X 1; 08/2016   "walking; double"  . Presence of permanent cardiac pacemaker 04/20/2018  . Pulmonary embolism ([place]) 05/2014   "left lung"  . Seasonal allergies    "bad" (04/20/2018)  . Vitamin D deficiency     Past Surgical History:  Procedure Laterality Date  . APPENDECTOMY  1959  . BASAL CELL CARCINOMA EXCISION     nose  . BREAST CYST ASPIRATION Right 2016?  Marland Kitchen CATARACT EXTRACTION W/ INTRAOCULAR LENS  IMPLANT, BILATERAL Bilateral 1990s  . CHOLECYSTECTOMY OPEN  1980s  . COLON SURGERY    . COLOSTOMY N/A 07/29/2016   Procedure: COLOSTOMY;  Surgeon: Clovis Riley, MD;  Location: [facility];  Service: General;  Laterality: N/A;  . DILATION AND CURETTAGE OF UTERUS    . ESOPHAGOGASTRODUODENOSCOPY N/A 12/11/2016   Procedure: ESOPHAGOGASTRODUODENOSCOPY (EGD);  Surgeon: Jerene Bears, MD;  Location: [facility] ENDOSCOPY;  Service: Endoscopy;  Laterality: N/A;  . EVALUATION UNDER ANESTHESIA WITH HEMORRHOIDECTOMY AND PROCTOSCOPY N/A 07/25/2016   Procedure: EXAM UNDER ANESTHESIA, EXCISION PERIANAL MASS.;  Surgeon: Judeth Horn, MD;  Location: [facility];  Service: General;  Laterality: N/A;  Prone position  . FLEXIBLE SIGMOIDOSCOPY N/A 06/16/2017   Procedure:  FLEXIBLE SIGMOIDOSCOPY EXAM UNDER ANESHESIA;  Surgeon: Ileana Roup, MD;  Location[facility];  Service: General;  Laterality: N/A;  . INSERT / REPLACE / REMOVE PACEMAKER  04/20/2018  . IR RADIOLOGIST EVAL & MGMT  03/25/2017  . LAPAROSCOPIC DIVERTED COLOSTOMY N/A 07/29/2016   Procedure: ATTEMPTED LAPAROSCOPIC ASSISTED COLOSTOMY;  Surgeon: Clovis Riley, MD;  Location: [facility];  Service: General;  Laterality: N/A;  . LUNG REMOVAL, PARTIAL Right 2013   RML mass/carcinoid, Dr Lurena Nida  . PACEMAKER IMPLANT N/A 04/20/2018   Procedure: PACEMAKER IMPLANT;  Surgeon: Evans Lance, MD;  Location: [facility] CV LAB;  Service: Cardiovascular;  Laterality: N/A;  . TONSILLECTOMY AND ADENOIDECTOMY  1960s  .  VAGINAL HYSTERECTOMY  1959   "partial"    Family History  Problem Relation Age of Onset  . Heart attack Mother        Dec 1987  . CVA Mother   . Hypertension Mother   . Multiple myeloma Mother   . Breast cancer Sister   . Skin cancer Sister   . Prostate cancer Brother   . Throat cancer Brother   . Cervical cancer Daughter   . Leukemia Daughter   . Leukemia Son   . Throat cancer Brother   . Cancer Brother        soft palette  . Heart Problems Brother        Pacemaker  . Colon cancer Neg Hx   . Pancreatic cancer Neg Hx     Social History   Tobacco Use  . Smoking status: Former Smoker    Packs/day: 0.00    Types: Cigarettes  . Smokeless tobacco: Never Used  . Tobacco comment: 04/20/2018 "smoked once in awhile in the 1960s; not more that 4 packs total in my whole life"  Substance Use Topics  . Alcohol use: Never    Frequency: Never     Subjective:  Ms Bowe is here today for routine follow up. Since our last OV, she has continued routine care with cardiology for sinus node dysfunction, PAF, chronic diastolic heart failure. She feels well today, but does c/o continued sinus/allergy symptoms, year round, has seen both ENT and allergist for evaluation and maintained on  flonase, xyzal and singulair, but continues to have allergy symptoms most days- water eyes, clear nasal drainage, nasal congestion. She is maintained on paxil 20 daily for anxiety, depression, without noted adverse effects and reports good control of mood. She is maintained on protonix 40 daily for GERD, no noted adverse medication effects and no recent signs of heartburn. HLD- maintained on lipitor 20. Was instructed to try stopping fenofibrate after 05/04/18 labs but unclear if she has stopped.  Lab Results  Component Value Date   CHOL 159 05/04/2018   HDL 60.60 05/04/2018   LDLCALC 79 05/04/2018   TRIG 95.0 05/04/2018   CHOLHDL 3 05/04/2018    ROS- See HPI  Objective:  Vitals:   11/02/18 0809  BP: 130/78  Pulse: 60  Temp: 97.7 F (36.5 C)  TempSrc: Oral  SpO2: 95%  Weight: 234 lb (106.1 kg)  Height: 5' 4"  (1.626 m)    General: Well developed, well nourished, in no acute distress  Skin : Warm and dry.  Head: Normocephalic and atraumatic  Eyes: Sclera and conjunctiva clear; pupils round and reactive to light; extraocular movements intact  Oropharynx: Pink, supple. No suspicious lesions  Neck: Supple Lungs: Effort unlabored, no respiratory distress. CVS exam: normal rate and regular rhythm.  Extremities: No edema, cyanosis, clubbing  Vessels: Symmetric bilaterally  Neurologic: Alert and oriented; speech intact; face symmetrical; moves all extremities well; CNII-XII intact without focal deficit  Psychiatric: Normal mood and affect.  Assessment:  1. Essential hypertension   2. Mixed hyperlipidemia   3. Gastroesophageal reflux disease, esophagitis presence not specified   4. Anxiety and depression   5. Allergic rhinitis, unspecified seasonality, unspecified trigger     Plan:   Return in about 6 months (around 05/05/2019) for routine follow up, annual labs.

## 2018-11-02 NOTE — Patient Instructions (Addendum)
Use flonase daily as prescribed. Try switching your xyzal for claritin or allegra.. Use saline spray. If no better please see your allergist for follow up  Allergic Rhinitis, Adult Allergic rhinitis is a reaction to allergens in the air. Allergens are tiny specks (particles) in the air that cause your body to have an allergic reaction. This condition cannot be passed from person to person (is not contagious). Allergic rhinitis cannot be cured, but it can be controlled. There are two types of allergic rhinitis:  Seasonal. This type is also called hay fever. It happens only during certain times of the year.  Perennial. This type can happen at any time of the year. What are the causes? This condition may be caused by:  Pollen from grasses, trees, and weeds.  House dust mites.  Pet dander.  Mold. What are the signs or symptoms? Symptoms of this condition include:  Sneezing.  Runny or stuffy nose (nasal congestion).  A lot of mucus in the back of the throat (postnasal drip).  Itchy nose.  Tearing of the eyes.  Trouble sleeping.  Being sleepy during day. How is this treated? There is no cure for this condition. You should avoid things that trigger your symptoms (allergens). Treatment can help to relieve symptoms. This may include:  Medicines that block allergy symptoms, such as antihistamines. These may be given as a shot, nasal spray, or pill.  Shots that are given until your body becomes less sensitive to the allergen (desensitization).  Stronger medicines, if all other treatments have not worked. Follow these instructions at home: Avoiding allergens   Find out what you are allergic to. Common allergens include smoke, dust, and pollen.  Avoid them if you can. These are some of the things that you can do to avoid allergens: ? Replace carpet with wood, tile, or vinyl flooring. Carpet can trap dander and dust. ? Clean any mold found in the home. ? Do not smoke. Do not  allow smoking in your home. ? Change your heating and air conditioning filter at least once a month. ? During allergy season:  Keep windows closed as much as you can. If possible, use air conditioning when there is a lot of pollen in the air.  Use a special filter for allergies with your furnace and air conditioner.  Plan outdoor activities when pollen counts are lowest. This is usually during the early morning or evening hours.  If you do go outdoors when pollen count is high, wear a special mask for people with allergies.  When you come indoors, take a shower and change your clothes before sitting on furniture or bedding. General instructions  Do not use fans in your home.  Do not hang clothes outside to dry.  Wear sunglasses to keep pollen out of your eyes.  Wash your hands right away after you touch household pets.  Take over-the-counter and prescription medicines only as told by your doctor.  Keep all follow-up visits as told by your doctor. This is important. Contact a doctor if:  You have a fever.  You have a cough that does not go away (is persistent).  You start to make whistling sounds when you breathe (wheeze).  Your symptoms do not get better with treatment.  You have thick fluid coming from your nose.  You start to have nosebleeds. Get help right away if:  Your tongue or your lips are swollen.  You have trouble breathing.  You feel dizzy or you feel like you are  going to pass out (faint).  You have cold sweats. Summary  Allergic rhinitis is a reaction to allergens in the air.  This condition may be caused by allergens. These include pollen, dust mites, pet dander, and mold.  Symptoms include a runny, itchy nose, sneezing, or tearing eyes. You may also have trouble sleeping or feel sleepy during the day.  Treatment includes taking medicines and avoiding allergens. You may also get shots or take stronger medicines.  Get help if you have a fever or  a cough that does not stop. Get help right away if you are short of breath. This information is not intended to replace advice given to you by your health care provider. Make sure you discuss any questions you have with your health care provider. Document Released: 11/28/2010 Document Revised: 02/17/2018 Document Reviewed: 02/17/2018 Elsevier Interactive Patient Education  2019 [employer].

## 2018-11-02 NOTE — Assessment & Plan Note (Signed)
Stable Continue protonix at current dosage F/u for new, worsening, recurrent symptoms

## 2018-11-02 NOTE — Assessment & Plan Note (Signed)
Will have her continue flonase, singulair and try switching xyzal to claritin or allegra Additional Home management, saline spray, red flags and return precautions including when to seek immediate care discussed and printed on AVS She will f/u with allergist/ENT if symptoms persist, dont improve

## 2018-11-02 NOTE — Assessment & Plan Note (Addendum)
Stable Continue atorvastatin, not sure if she is still taking fenofibrate but did tell her she could try discontinuing fenofibrate after 05/04/18 lipid panel Will need to recheck lipid panel around 11/2681 - Basic metabolic panel; Future - CBC; Future

## 2018-11-03 NOTE — Telephone Encounter (Signed)
LVM for Alex to call back with the specific medication the code is needed for. Requested more detail as to what the code is for in order to provide the correct one.Marland Kitchen

## 2018-11-05 NOTE — Telephone Encounter (Signed)
We have attempted to contact Tricia Hernandez several times with no success or call back. Per triage protocol, message will be closed.

## 2018-11-27 ENCOUNTER — Telehealth: Payer: Self-pay | Admitting: Hematology and Oncology

## 2018-11-27 NOTE — Telephone Encounter (Signed)
Called patient per 4/17 sch message - no answer - left message for patient to call back for r/s

## 2018-12-01 ENCOUNTER — Other Ambulatory Visit: Payer: Self-pay | Admitting: *Deleted

## 2018-12-01 DIAGNOSIS — F4323 Adjustment disorder with mixed anxiety and depressed mood: Secondary | ICD-10-CM

## 2018-12-01 DIAGNOSIS — J42 Unspecified chronic bronchitis: Secondary | ICD-10-CM

## 2018-12-01 MED ORDER — MONTELUKAST SODIUM 10 MG PO TABS: 10.0000 mg | ORAL_TABLET | Freq: Every day | ORAL | 0 refills | Status: DC

## 2018-12-01 MED ORDER — PAROXETINE HCL 20 MG PO TABS: 20.0000 mg | ORAL_TABLET | Freq: Every day | ORAL | 0 refills | Status: DC

## 2018-12-01 NOTE — Addendum Note (Signed)
Addended by: Cresenciano Lick on: 12/01/2018 08:41 AM   Modules accepted: Orders

## 2018-12-22 ENCOUNTER — Other Ambulatory Visit: Payer: Self-pay | Admitting: *Deleted

## 2018-12-28 ENCOUNTER — Ambulatory Visit: Payer: Medicare Other | Admitting: Hematology and Oncology

## 2018-12-28 ENCOUNTER — Other Ambulatory Visit: Payer: Medicare Other

## 2018-12-28 ENCOUNTER — Ambulatory Visit (HOSPITAL_COMMUNITY): Payer: Medicare Other

## 2018-12-28 ENCOUNTER — Ambulatory Visit (HOSPITAL_COMMUNITY): Admission: RE | Admit: 2018-12-28 | Payer: Medicare Other | Source: Ambulatory Visit

## 2018-12-28 ENCOUNTER — Other Ambulatory Visit: Payer: Self-pay | Admitting: *Deleted

## 2018-12-28 DIAGNOSIS — E782 Mixed hyperlipidemia: Secondary | ICD-10-CM

## 2018-12-28 MED ORDER — ATORVASTATIN CALCIUM 20 MG PO TABS: 20.0000 mg | ORAL_TABLET | Freq: Every day | ORAL | 0 refills | Status: DC

## 2018-12-31 ENCOUNTER — Ambulatory Visit: Payer: Medicare Other | Admitting: Hematology and Oncology

## 2018-12-31 ENCOUNTER — Other Ambulatory Visit: Payer: Medicare Other

## 2019-01-18 ENCOUNTER — Ambulatory Visit (INDEPENDENT_AMBULATORY_CARE_PROVIDER_SITE_OTHER): Payer: Medicare Other | Admitting: *Deleted

## 2019-01-18 DIAGNOSIS — R55 Syncope and collapse: Secondary | ICD-10-CM

## 2019-01-18 DIAGNOSIS — I495 Sick sinus syndrome: Secondary | ICD-10-CM

## 2019-01-18 LAB — CUP PACEART REMOTE DEVICE CHECK
Battery Remaining Longevity: 99 mo
Battery Remaining Percentage: 95.5 %
Battery Voltage: 3.01 V
Brady Statistic AP VP Percent: 11 %
Brady Statistic AP VS Percent: 81 %
Brady Statistic AS VP Percent: 1 %
Brady Statistic AS VS Percent: 7.9 %
Brady Statistic RA Percent Paced: 88 %
Brady Statistic RV Percent Paced: 11 %
Date Time Interrogation Session: 20200608060014
Implantable Lead Implant Date: 20190909
Implantable Lead Implant Date: 20190909
Implantable Lead Location: 753859
Implantable Lead Location: 753860
Implantable Lead Model: 3830
Implantable Pulse Generator Implant Date: 20190909
Lead Channel Impedance Value: 380 Ohm
Lead Channel Impedance Value: 430 Ohm
Lead Channel Pacing Threshold Amplitude: 0.625 V
Lead Channel Pacing Threshold Amplitude: 0.75 V
Lead Channel Pacing Threshold Pulse Width: 0.5 ms
Lead Channel Pacing Threshold Pulse Width: 1 ms
Lead Channel Sensing Intrinsic Amplitude: 1.1 mV
Lead Channel Sensing Intrinsic Amplitude: 4.2 mV
Lead Channel Setting Pacing Amplitude: 1.625
Lead Channel Setting Pacing Amplitude: 2.5 V
Lead Channel Setting Pacing Pulse Width: 1 ms
Lead Channel Setting Sensing Sensitivity: 2 mV
Pulse Gen Model: 2272
Pulse Gen Serial Number: 9061801

## 2019-01-23 ENCOUNTER — Other Ambulatory Visit: Payer: Self-pay | Admitting: Hematology and Oncology

## 2019-01-23 ENCOUNTER — Other Ambulatory Visit: Payer: Self-pay | Admitting: Internal Medicine

## 2019-01-25 DIAGNOSIS — Z85828 Personal history of other malignant neoplasm of skin: Secondary | ICD-10-CM | POA: Diagnosis not present

## 2019-01-25 DIAGNOSIS — D485 Neoplasm of uncertain behavior of skin: Secondary | ICD-10-CM | POA: Diagnosis not present

## 2019-01-25 DIAGNOSIS — C44319 Basal cell carcinoma of skin of other parts of face: Secondary | ICD-10-CM | POA: Diagnosis not present

## 2019-01-25 DIAGNOSIS — Z08 Encounter for follow-up examination after completed treatment for malignant neoplasm: Secondary | ICD-10-CM | POA: Diagnosis not present

## 2019-01-26 ENCOUNTER — Other Ambulatory Visit: Payer: Self-pay | Admitting: Internal Medicine

## 2019-01-26 MED ORDER — MAGNESIUM OXIDE 400 (241.3 MG) MG PO TABS: 1.0000 | ORAL_TABLET | Freq: Every day | ORAL | 1 refills | Status: DC

## 2019-01-27 ENCOUNTER — Other Ambulatory Visit: Payer: Self-pay | Admitting: *Deleted

## 2019-01-27 ENCOUNTER — Telehealth: Payer: Self-pay | Admitting: Nurse Practitioner

## 2019-01-27 MED ORDER — FENOFIBRATE 160 MG PO TABS: ORAL_TABLET | ORAL | 0 refills | Status: DC

## 2019-01-27 MED ORDER — PANTOPRAZOLE SODIUM 40 MG PO TBEC: DELAYED_RELEASE_TABLET | ORAL | 0 refills | Status: DC

## 2019-01-27 NOTE — Telephone Encounter (Signed)
Medication Refill - Medication: pantoprazole (PROTONIX) 40 MG tablet and fenofibrate 160 MG tablet  Refill request for the above medications  Preferred Pharmacy (with phone number or street name):  [facility] [place], [place] [phone] (Phone) [phone] (Fax)

## 2019-01-27 NOTE — Addendum Note (Signed)
Addended by: Cresenciano Lick on: 01/27/2019 03:01 PM   Modules accepted: Orders

## 2019-01-27 NOTE — Progress Notes (Signed)
Remote pacemaker transmission.   

## 2019-01-27 NOTE — Telephone Encounter (Signed)
Done earlier today. See meds.

## 2019-02-02 NOTE — Assessment & Plan Note (Signed)
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 It is unclear but the inguinal lymphadenopathy and the lung nodulescould be metastatic disease. it could be a stage IIIa versus stage IV.  Treatmentsummary 08/13/2016-09/25/2016: XRT to anus S/P diverting colostomy  CT CAP:12/30/2016: Nonspecific wall thickening posteriorly lower left rectum inguinal adenopathy decreased stable bilateral lung nodules favor benign  Severe anemia from GI bleeding: Stopped after discontinuing anticoagulation CT CAP 02/08/2019:  Return to clinic in 1 year for follow-up

## 2019-02-03 ENCOUNTER — Telehealth: Payer: Self-pay | Admitting: Hematology and Oncology

## 2019-02-03 NOTE — Telephone Encounter (Signed)
I left a message regarding video visit  °

## 2019-02-05 ENCOUNTER — Other Ambulatory Visit: Payer: Self-pay

## 2019-02-05 DIAGNOSIS — C3400 Malignant neoplasm of unspecified main bronchus: Secondary | ICD-10-CM

## 2019-02-06 NOTE — Progress Notes (Signed)
HEMATOLOGY-ONCOLOGY DOXIMITY VIDEO VISIT PROGRESS NOTE  I connected with [name] on 02/08/2019 at  3:30 PM EDT by Doximity video conference and verified that I am speaking with the correct person using two identifiers.  I discussed the limitations, risks, security and privacy concerns of performing an evaluation and management service by Doximity and the availability of in person appointments.  I also discussed with the patient that there may be a patient responsible charge related to this service. The patient expressed understanding and agreed to proceed.   Patient's Location: Home Physician Location: Clinic  CHIEF COMPLIANT: Annual follow-up of perianal squamous cell carcinoma to review recent CT scans  INTERVAL HISTORY: Tricia Hernandez is a 85 y.o. female with above-mentioned history of perianal squamous cell carcinoma that was treated with annual radiation. I last saw her a year ago. CT CAP today showed stable bilateral pulmonary nodules, no adenopathy, stable low rectal/anal wall thickening with no discrete mass, and no evidence of metastatic disease. She presents over Doximity today for annual follow-up.   Oncology History  Lung cancer [facility])  Anal cancer ([place])  07/25/2016 Initial Diagnosis   Perianal: Squamous cell cancer atleast 5 cm, Moderately differentiated T2N1 (lung nodules, inguinal LN unclear etiology) stage 3B vs Stage 4   08/13/2016 -  Radiation Therapy   Anal Radiation (diverting colostomy)     REVIEW OF SYSTEMS:   Constitutional: Denies fevers, chills or abnormal weight loss Eyes: Denies blurriness of vision Ears, nose, mouth, throat, and face: Denies mucositis or sore throat Respiratory: Denies cough, dyspnea or wheezes Cardiovascular: Denies palpitation, chest discomfort Gastrointestinal:  Denies nausea, heartburn or change in bowel habits Skin: Denies abnormal skin rashes Lymphatics: Denies new lymphadenopathy or easy bruising Neurological:Denies  numbness, tingling or new weaknesses Behavioral/Psych: Mood is stable, no new changes  Extremities: No lower extremity edema Breast: denies any pain or lumps or nodules in either breasts All other systems were reviewed with the patient and are negative.  Observations/Objective:  There were no vitals filed for this visit. There is no height or weight on file to calculate BMI.  I have reviewed the data as listed CMP Latest Ref Rng & Units 02/08/2019 11/02/2018 08/17/2018  Glucose 70 - 99 mg/dL 90 86 104(H)  BUN 8 - 23 mg/dL 21 25(H) 22  Creatinine 0.44 - 1.00 mg/dL 1.10(H) 1.08 1.11(H)  Sodium 135 - 145 mmol/L 143 140 138  Potassium 3.5 - 5.1 mmol/L 4.9 4.7 4.2  Chloride 98 - 111 mmol/L 104 101 100  CO2 22 - 32 mmol/L 30 34(H) 30  Calcium 8.9 - 10.3 mg/dL 9.0 9.2 9.1  Total Protein 6.5 - 8.1 g/dL 6.7 - -  Total Bilirubin 0.3 - 1.2 mg/dL 0.3 - -  Alkaline Phos 38 - 126 U/L 51 - -  AST 15 - 41 U/L 23 - -  ALT 0 - 44 U/L 12 - -    Lab Results  Component Value Date   WBC 4.4 02/08/2019   HGB 10.9 (L) 02/08/2019   HCT 36.0 02/08/2019   MCV 87.4 02/08/2019   PLT 139 (L) 02/08/2019   NEUTROABS 2.7 02/08/2019    Assessment Plan:  Anal cancer ([place]) 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 It is unclear but the inguinal lymphadenopathy and the lung nodulescould be metastatic disease. it could be a stage IIIa versus stage IV.  Treatmentsummary 08/13/2016-09/25/2016: XRT to anus S/P diverting colostomy  CT CAP:12/30/2016: Nonspecific wall thickening  posteriorly lower left rectum inguinal adenopathy decreased stable bilateral lung nodules favor benign  Severe anemia from GI bleeding: Stopped after discontinuing anticoagulation CT CAP 02/08/2019: Stable pulmonary nodules no evidence of metastatic disease. Blood work was reviewed.  Encouraged her to drink more water. Mild anemia: Anemia due to chronic disease.   Return to clinic in 1 year for follow-up with labs and scans and follow-up call in the same day.    I discussed the assessment and treatment plan with the patient. The patient was provided an opportunity to ask questions and all were answered. The patient agreed with the plan and demonstrated an understanding of the instructions. The patient was advised to call back or seek an in-person evaluation if the symptoms worsen or if the condition fails to improve as anticipated.   I provided 15 minutes of face-to-face Doximity video visit time during this encounter.    Rulon Eisenmenger, MD 02/08/2019   I, Molly Dorshimer, am acting as scribe for Nicholas Lose, MD.  I have reviewed the above documentation for accuracy and completeness, and I agree with the above.

## 2019-02-08 ENCOUNTER — Inpatient Hospital Stay: Payer: Medicare Other

## 2019-02-08 ENCOUNTER — Other Ambulatory Visit: Payer: Self-pay

## 2019-02-08 ENCOUNTER — Encounter (HOSPITAL_COMMUNITY): Payer: Self-pay

## 2019-02-08 ENCOUNTER — Ambulatory Visit (HOSPITAL_COMMUNITY)
Admission: RE | Admit: 2019-02-08 | Discharge: 2019-02-08 | Disposition: A | Discharge status: Home / Self Care | Payer: Medicare Other | Source: Ambulatory Visit | Attending: Hematology and Oncology | Admitting: Hematology and Oncology

## 2019-02-08 ENCOUNTER — Inpatient Hospital Stay: Payer: Medicare Other | Attending: Hematology and Oncology | Admitting: Hematology and Oncology

## 2019-02-08 DIAGNOSIS — Z923 Personal history of irradiation: Secondary | ICD-10-CM | POA: Diagnosis not present

## 2019-02-08 DIAGNOSIS — R918 Other nonspecific abnormal finding of lung field: Secondary | ICD-10-CM

## 2019-02-08 DIAGNOSIS — C3490 Malignant neoplasm of unspecified part of unspecified bronchus or lung: Secondary | ICD-10-CM

## 2019-02-08 DIAGNOSIS — C210 Malignant neoplasm of anus, unspecified: Secondary | ICD-10-CM | POA: Diagnosis not present

## 2019-02-08 DIAGNOSIS — D638 Anemia in other chronic diseases classified elsewhere: Secondary | ICD-10-CM | POA: Insufficient documentation

## 2019-02-08 DIAGNOSIS — C3400 Malignant neoplasm of unspecified main bronchus: Secondary | ICD-10-CM

## 2019-02-08 DIAGNOSIS — K5730 Diverticulosis of large intestine without perforation or abscess without bleeding: Secondary | ICD-10-CM | POA: Diagnosis not present

## 2019-02-08 DIAGNOSIS — Z933 Colostomy status: Secondary | ICD-10-CM | POA: Diagnosis not present

## 2019-02-08 LAB — CBC WITH DIFFERENTIAL (CANCER CENTER ONLY)
Abs Immature Granulocytes: 0.02 10*3/uL (ref 0.00–0.07)
Basophils Absolute: 0 10*3/uL (ref 0.0–0.1)
Basophils Relative: 0 %
Eosinophils Absolute: 0.3 10*3/uL (ref 0.0–0.5)
Eosinophils Relative: 7 %
HCT: 36 % (ref 36.0–46.0)
Hemoglobin: 10.9 g/dL — ABNORMAL LOW (ref 12.0–15.0)
Immature Granulocytes: 1 %
Lymphocytes Relative: 24 %
Lymphs Abs: 1 10*3/uL (ref 0.7–4.0)
MCH: 26.5 pg (ref 26.0–34.0)
MCHC: 30.3 g/dL (ref 30.0–36.0)
MCV: 87.4 fL (ref 80.0–100.0)
Monocytes Absolute: 0.4 10*3/uL (ref 0.1–1.0)
Monocytes Relative: 9 %
Neutro Abs: 2.7 10*3/uL (ref 1.7–7.7)
Neutrophils Relative %: 59 %
Platelet Count: 139 10*3/uL — ABNORMAL LOW (ref 150–400)
RBC: 4.12 MIL/uL (ref 3.87–5.11)
RDW: 15.9 % — ABNORMAL HIGH (ref 11.5–15.5)
WBC Count: 4.4 10*3/uL (ref 4.0–10.5)
nRBC: 0 % (ref 0.0–0.2)

## 2019-02-08 LAB — CMP (CANCER CENTER ONLY)
ALT: 12 U/L (ref 0–44)
AST: 23 U/L (ref 15–41)
Albumin: 4 g/dL (ref 3.5–5.0)
Alkaline Phosphatase: 51 U/L (ref 38–126)
Anion gap: 9 (ref 5–15)
BUN: 21 mg/dL (ref 8–23)
CO2: 30 mmol/L (ref 22–32)
Calcium: 9 mg/dL (ref 8.9–10.3)
Chloride: 104 mmol/L (ref 98–111)
Creatinine: 1.1 mg/dL — ABNORMAL HIGH (ref 0.44–1.00)
GFR, Est AFR Am: 53 mL/min — ABNORMAL LOW (ref >60)
GFR, Estimated: 46 mL/min — ABNORMAL LOW (ref >60)
Glucose, Bld: 90 mg/dL (ref 70–99)
Potassium: 4.9 mmol/L (ref 3.5–5.1)
Sodium: 143 mmol/L (ref 135–145)
Total Bilirubin: 0.3 mg/dL (ref 0.3–1.2)
Total Protein: 6.7 g/dL (ref 6.5–8.1)

## 2019-02-08 MED ORDER — SODIUM CHLORIDE (PF) 0.9 % IJ SOLN
INTRAMUSCULAR | Status: AC
  Filled 2019-02-08: qty 50

## 2019-02-08 MED ORDER — IOHEXOL 300 MG/ML  SOLN
100.0000 mL | Freq: Once | INTRAMUSCULAR | Status: AC | PRN
  Administered 2019-02-08: 100 mL via INTRAVENOUS

## 2019-02-09 ENCOUNTER — Telehealth: Payer: Self-pay | Admitting: Hematology and Oncology

## 2019-02-09 NOTE — Telephone Encounter (Signed)
I talk with everley regarding schedule

## 2019-02-26 ENCOUNTER — Ambulatory Visit: Payer: Self-pay | Admitting: Urology

## 2019-03-02 ENCOUNTER — Telehealth: Payer: Self-pay | Admitting: Nurse Practitioner

## 2019-03-02 ENCOUNTER — Telehealth: Payer: Self-pay | Admitting: Pulmonary Disease

## 2019-03-02 ENCOUNTER — Ambulatory Visit
Admission: RE | Admit: 2019-03-02 | Discharge: 2019-03-02 | Disposition: A | Discharge status: Home / Self Care | Payer: Medicare Other | Source: Ambulatory Visit | Attending: Urology | Admitting: Urology

## 2019-03-02 DIAGNOSIS — J42 Unspecified chronic bronchitis: Secondary | ICD-10-CM

## 2019-03-02 DIAGNOSIS — Z923 Personal history of irradiation: Secondary | ICD-10-CM | POA: Diagnosis not present

## 2019-03-02 DIAGNOSIS — C211 Malignant neoplasm of anal canal: Secondary | ICD-10-CM | POA: Diagnosis not present

## 2019-03-02 DIAGNOSIS — C210 Malignant neoplasm of anus, unspecified: Secondary | ICD-10-CM

## 2019-03-02 DIAGNOSIS — F4323 Adjustment disorder with mixed anxiety and depressed mood: Secondary | ICD-10-CM

## 2019-03-02 DIAGNOSIS — E782 Mixed hyperlipidemia: Secondary | ICD-10-CM

## 2019-03-02 MED ORDER — BUDESONIDE 0.5 MG/2ML IN SUSP: RESPIRATORY_TRACT | 2 refills | Status: DC

## 2019-03-02 NOTE — Telephone Encounter (Signed)
Spoke with patient's daughter Collie Siad. She stated that her mother needed a refill on her Pulmicort solution to be sent to [facility] in [place]. Advised her that I would go ahead and send in the refill. She verbalized understanding.   Nothing further needed at time of call.

## 2019-03-02 NOTE — Progress Notes (Addendum)
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  The patient was notified in advance and gave permission to proceed with this visit format.. She is accomanied by her daughter, Tricia Hernandez.    In brief summary, she initially presented with two-week history of rectal bleeding. She was referred to gastroenterology as an outpatient and was subsequently referred to general surgery and admitted to the hospital on 07/22/2016. Dr. Hulen Skains examined her in the operating room and found a left sided fissure with associated friable mucosa and a large left-sided subcutaneous mass. Biopsy of this mass revealed squamous cell carcinoma. CT of the C/A/P for disease staging on 07/25/16 revealed bilateral inguinal lymphadenopathy with bilateral lung nodules of unclear significance- could be metastatic disease. She has a history of carcinoid diagnosed on previous lung resections prior to recent admission. She had diverting colostomy 07/29/16. She elected to proceed with definitive radiotherapy to the pelvis/anus and completed a course of 30 fxs for a total of 54 Gy on 09/25/18.  A PET scan was performed 09/16/16 for further evaluation of lung nodules and showed focal anal activity with SUV 13.3. The inguinal lymph nodes (especially the left) appeared smaller than previous, and  fairly low in metabolic activity, favoring response to therapy. There was faint nodularity in the lungs, similar to prior studies, quite possibly inflammatory with clustered nodularity in the right lower lobe- non-specific and warranting surveillance.  She remained in observation with Dr. Lindi Adie.  CT CAP on 12/30/2016: Nonspecific wall thickening posteriorly lower left rectum with inguinal adenopathy decreased and stable bilateral lung nodules- favor benign etiology  Follow up CT C/A/P in 12/2017 showed a few new, solid pulmonary nodules, largest was an irregular solid 1.8 x 1.3 cm peripheral right lower lobe pulmonary nodule and a 26m subpleural posterior left upper lobe nodule, that were nonspecific, with new pulmonary metastases not excluded.  She had further evaluation of the new lung nodules with PET on 02/10/18 which demonstrated persistent right lung pulmonary nodules, most of which were sub-solid.  There was a mildly hypermetabolic 9 mm nodule in the right lower lobe with SUV max of 4.2 and concerning for neoplasm- recommendation was for close surveillance.  There were no enlarged or hypermetabolic mediastinal or hilar lymph nodes and no persistent or residual hypermetabolism in the pelvic, abdominal or anorectal area.             Interval History:  She reports that overall, she is doing well and currently without complaints aside from fatigue.  She was recently informed that her Hgb was running low, suspect blood loss anemia since she has a h/o GI bleed previously.  She is going to see Dr. GCarlean Purlon 03/30/19 for further evaluation.  She specifically denies blood per rectum, blood in her stools in colostomy bag, rectal pain or abdominal  pain. She continues with occasional clear jelly/mucous seepage from the rectum, unchanged recently.  She continues with a healthy appetite and denies N/V or diarrhea.  She has continued to gradually lose weight but by intentional means with watching her diet and limiting  the amounts of breads and sweets that she eats. Follow up CT C/A/P on 02/08/19 suggested stable disease and circumferential wall thickening in the lower rectum without discrete recurrent anorectal mass, presumably representing post treatment change/treated tumor and no evidence of recurrent metastatic disease in the abdomen or pelvis. There were stable numerous bilateral pulmonary nodules and no new or progressive findings.  She is followed with Dr. Lindi Adie in medical oncology who has recommended continued observation with annual CT chest/abdomen/pelvis and follow up visit thereafter to review results. She has a scheduled follow up appointment with Dr. Lindi Adie on 02/08/2020.  She has a history of chronic blood loss anemia with GI bleeds managed by Dr. Carlean Purl.  She was evaluated with capsule endoscopy on 03/04/17 for further evaluation of the small bowel which to her knowledge did not demonstrate any significant source of active bleeding.  She had recent sigmoidoscopy with Dr. Dema Severin in Feb. 2019 which was reportedly normal.  She see's him annually for sigmoidoscopy but missed her regularly scheduled visit this year due to the COVID-19 pandemic.  She is awaiting re-schedule.  She is also followed by Dr. Vaughan Browner in Pulmonology for history of lung carcinoid- s/p right lung resection in 2012.  She was seen on 02/02/18 regarding new pulmonary nodules.  He ordered the PET scan which showed only mild hypermetabolism in the right lower lobe nodule and did not show any disease progression as compared to prior CT Chest from 12/2017 so he plans to continue following her expectantly.   On review of systems, the patient states that she is doing well overall and has recovered well from the effects of radiotherapy. Her fatigue is manageable and she continues to slowly regain her strength.  She reports that her colostomy bag continues to drain normal/loose stool with no blood noted.  They are changing the colostomy bag regularly  and deny any skin breakdown or irritation at the colostomy site.   She has not had any rectal pain. She denies fever, chills, night sweats, chest pain, increased shortness of breath, nausea or vomiting.  Her appetite is good and she is maintaining her weight.  ALLERGIES:  is allergic to penicillins; aleve [naproxen sodium]; antihistamines, chlorpheniramine-type; aspirin; benadryl [diphenhydramine hcl]; codeine; hydrochlorothiazide; rocephin [ceftriaxone sodium in dextrose]; and sulfa antibiotics.  Meds: Current Outpatient Medications  Medication Sig Dispense Refill   acetaminophen (TYLENOL) 325 MG tablet Take 650 mg by mouth daily as needed for moderate pain or headache.      atorvastatin (LIPITOR) 20 MG tablet Take 1 tablet (20 mg total) by mouth at bedtime. Needs to establish with new provider. 90 tablet 0   Biotin 10000 MCG TABS Take 10,000 mcg by mouth at bedtime.      BROVANA 15 MCG/2ML NEBU INHALE 1 VIAL BY MOUTH VIA NEBULIZER TWICE DAILY 120 mL 0   budesonide (PULMICORT) 0.5 MG/2ML nebulizer solution USE 1 VIAL VIA NEBULIZER TWICE DAILY 360 mL 2   Calcium 600-200 MG-UNIT tablet Take 1 tablet by mouth at bedtime.      dofetilide (TIKOSYN) 500 MCG capsule TAKE 1 CAPSULE(500 MCG) BY MOUTH TWICE DAILY 180 capsule 2   fenofibrate 160 MG tablet TAKE 1 TABLET(160 MG) BY MOUTH DAILY **NEED TO ESTABLISH WITH NEW PROVIDER** 90  tablet 0   ferrous sulfate 325 (65 FE) MG EC tablet Take 325 mg by mouth 2 (two) times daily.     fluticasone (FLONASE) 50 MCG/ACT nasal spray Place 1 spray into both nostrils daily.     furosemide (LASIX) 20 MG tablet TAKE 1 TABLET(20 MG) BY MOUTH DAILY (Patient taking differently: Take 20 mg by mouth See admin instructions. Every third day) 90 tablet 0   ipratropium-albuterol (DUONEB) 0.5-2.5 (3) MG/3ML SOLN Take 3 mLs by nebulization 2 (two) times daily. 540 mL 0   levocetirizine (XYZAL) 5 MG tablet Take 5 mg by mouth at bedtime.  6   magnesium oxide  (MAGNESIUM-OXIDE) 400 (241.3 Mg) MG tablet Take 1 tablet (400 mg total) by mouth daily. 90 tablet 1   montelukast (SINGULAIR) 10 MG tablet Take 1 tablet (10 mg total) by mouth at bedtime. 90 tablet 0   Ostomy Supplies KIT 8514 is number on bag one piece unit. Colostomy bag and adhesive and alcohol prep, no sting, cannot use regular prep pad. 30 each 11   pantoprazole (PROTONIX) 40 MG tablet TAKE 1 TABLET(40 MG) BY MOUTH DAILY **NEED TO ESTABLISH WITH NEW PROVIDER** 90 tablet 0   PARoxetine (PAXIL) 20 MG tablet Take 1 tablet (20 mg total) by mouth daily. 90 tablet 0   vitamin B-12 (CYANOCOBALAMIN) 1000 MCG tablet Take 1,000 mcg by mouth at bedtime.      Vitamin D, Ergocalciferol, (DRISDOL) 1.25 MG (50000 UT) CAPS capsule TAKE 1 CAPSULE BY MOUTH 1 TIME A WEEK 12 capsule 0   No current facility-administered medications for this encounter.    Facility-Administered Medications Ordered in Other Encounters  Medication Dose Route Frequency Provider Last Rate Last Dose   sodium phosphate (FLEET) 7-19 GM/118ML enema 2 enema  2 enema Rectal Once Ileana Roup, MD        Physical Findings:  vitals were not taken for this visit.   0/10 Unable to assess due to telephone follow up visit format.  Lab Findings: Lab Results  Component Value Date   WBC 4.4 02/08/2019   HGB 10.9 (L) 02/08/2019   HCT 36.0 02/08/2019   MCV 87.4 02/08/2019   PLT 139 (L) 02/08/2019     Radiographic Findings: Ct Chest W Contrast  Result Date: 02/08/2019 CLINICAL DATA:  Restaging unresectable anal cancer. Initial diagnosis December 2017. History of colon resection and colostomy. Remote history of right lung cancer. EXAM: CT CHEST, ABDOMEN, AND PELVIS WITH CONTRAST TECHNIQUE: Multidetector CT imaging of the chest, abdomen and pelvis was performed following the standard protocol during bolus administration of intravenous contrast. CONTRAST:  143m OMNIPAQUE IOHEXOL 300 MG/ML  SOLN COMPARISON:  PET-CT 02/10/2018  FINDINGS: CT CHEST FINDINGS Cardiovascular: The heart is mildly enlarged but stable. No pericardial effusion. Stable tortuosity, ectasia and calcification of the thoracic aorta. No dissection. The branch vessels are patent. Stable scattered coronary artery calcifications. The pacer wires are stable. Mediastinum/Nodes: No mediastinal or hilar mass or lymphadenopathy. The esophagus is grossly normal. Lungs/Pleura: Several small bilateral pulmonary nodules are again demonstrated. The largest lesion in the right upper lobe on image number 53 measures 10.5. This previously measured 10 mm. No new or progressive findings. Stable subpleural scarring changes in the right upper lobe on image number 47. There is also a stable area of tree-in-bud type changes in the right lower lobe. There are surgical changes involving the right lung from previous lung cancer resection. Musculoskeletal: No breast masses, supraclavicular or axillary adenopathy. The thyroid gland appears normal. Left-sided  pacemaker is stable. The bony structures are unremarkable. No worrisome bone lesions. Stable osteoporosis. CT ABDOMEN PELVIS FINDINGS Hepatobiliary: No focal hepatic lesions to suggest metastatic disease. Stable mild intra and extrahepatic biliary dilatation due to prior cholecystectomy. Pancreas: No mass, inflammation or ductal dilatation. Spleen: Normal size.  No focal lesions. Adrenals/Urinary Tract: The adrenal glands and kidneys appear stable. There are fairly extensive scarring type changes involving the right kidney. No hydronephrosis and no worrisome renal or bladder lesions without contrast. Stomach/Bowel: There is a left lower quadrant colostomy and a long Hartmann's pouch. Diverticulosis involving the sigmoid colon. No colonic lesions or obstructive findings. The terminal ileum is normal. The stomach, duodenum and small bowel are unremarkable. No obvious anal or perianal mass. Soft tissue thickening in this area is likely radiation  related. Vascular/Lymphatic: Stable atherosclerotic calcifications involving the aorta iliac arteries. The major branch vessels are patent. The major venous structures are patent. No mesenteric or retroperitoneal mass or adenopathy. Reproductive: The uterus is surgically absent. Both ovaries are still present and appear normal. Other: No pelvic mass or pelvic lymphadenopathy. No inguinal mass or adenopathy. Musculoskeletal: No significant bony findings. IMPRESSION: 1. Stable numerous bilateral pulmonary nodules as detailed above. No new or progressive findings. 2. Stable surgical changes involving the right lung. 3. No mediastinal or hilar mass or adenopathy. 4. Stable low rectal/anal wall thickening, likely related to radiation. No discrete mass or perianal/perirectal adenopathy. 5. Left lower quadrant colostomy and Hartman's pouch. 6. No abdominal/pelvic findings for metastatic disease. Electronically Signed   By: Marijo Sanes M.D.   On: 02/08/2019 11:56   Ct Abdomen Pelvis W Contrast  Result Date: 02/08/2019 CLINICAL DATA:  Restaging unresectable anal cancer. Initial diagnosis December 2017. History of colon resection and colostomy. Remote history of right lung cancer. EXAM: CT CHEST, ABDOMEN, AND PELVIS WITH CONTRAST TECHNIQUE: Multidetector CT imaging of the chest, abdomen and pelvis was performed following the standard protocol during bolus administration of intravenous contrast. CONTRAST:  192m OMNIPAQUE IOHEXOL 300 MG/ML  SOLN COMPARISON:  PET-CT 02/10/2018 FINDINGS: CT CHEST FINDINGS Cardiovascular: The heart is mildly enlarged but stable. No pericardial effusion. Stable tortuosity, ectasia and calcification of the thoracic aorta. No dissection. The branch vessels are patent. Stable scattered coronary artery calcifications. The pacer wires are stable. Mediastinum/Nodes: No mediastinal or hilar mass or lymphadenopathy. The esophagus is grossly normal. Lungs/Pleura: Several small bilateral pulmonary  nodules are again demonstrated. The largest lesion in the right upper lobe on image number 53 measures 10.5. This previously measured 10 mm. No new or progressive findings. Stable subpleural scarring changes in the right upper lobe on image number 47. There is also a stable area of tree-in-bud type changes in the right lower lobe. There are surgical changes involving the right lung from previous lung cancer resection. Musculoskeletal: No breast masses, supraclavicular or axillary adenopathy. The thyroid gland appears normal. Left-sided pacemaker is stable. The bony structures are unremarkable. No worrisome bone lesions. Stable osteoporosis. CT ABDOMEN PELVIS FINDINGS Hepatobiliary: No focal hepatic lesions to suggest metastatic disease. Stable mild intra and extrahepatic biliary dilatation due to prior cholecystectomy. Pancreas: No mass, inflammation or ductal dilatation. Spleen: Normal size.  No focal lesions. Adrenals/Urinary Tract: The adrenal glands and kidneys appear stable. There are fairly extensive scarring type changes involving the right kidney. No hydronephrosis and no worrisome renal or bladder lesions without contrast. Stomach/Bowel: There is a left lower quadrant colostomy and a long Hartmann's pouch. Diverticulosis involving the sigmoid colon. No colonic lesions or obstructive findings. The  terminal ileum is normal. The stomach, duodenum and small bowel are unremarkable. No obvious anal or perianal mass. Soft tissue thickening in this area is likely radiation related. Vascular/Lymphatic: Stable atherosclerotic calcifications involving the aorta iliac arteries. The major branch vessels are patent. The major venous structures are patent. No mesenteric or retroperitoneal mass or adenopathy. Reproductive: The uterus is surgically absent. Both ovaries are still present and appear normal. Other: No pelvic mass or pelvic lymphadenopathy. No inguinal mass or adenopathy. Musculoskeletal: No significant bony  findings. IMPRESSION: 1. Stable numerous bilateral pulmonary nodules as detailed above. No new or progressive findings. 2. Stable surgical changes involving the right lung. 3. No mediastinal or hilar mass or adenopathy. 4. Stable low rectal/anal wall thickening, likely related to radiation. No discrete mass or perianal/perirectal adenopathy. 5. Left lower quadrant colostomy and Hartman's pouch. 6. No abdominal/pelvic findings for metastatic disease. Electronically Signed   By: Marijo Sanes M.D.   On: 02/08/2019 11:56    Impression/Plan: 1. Stage IIIB, T2 N31 MX, squamous cell carcinoma of the anal canal.   The patient appears to remain stable both radiographically and clinically.  She is currently without complaints She will continue routine annual follow up with Dr. Lindi Adie as planned in 6/2021with plans to obtain restaging systemic imaging with CT Chest/Abd/Pelvis annually.  She will also continue in routine follow up with Dr. Dema Severin in general surgery for anoscopy/sigmoidoscopy every 6-12 months.  Her daughter, Tricia Hernandez, will call for this appointment.  We discussed that while we are happy to continue to participate in her care if clinically indicated, at this point, we will plan to see her back on an as-needed basis. In the interim, she knows to contact us with any questions or concerns related to her previous radiotherapy.  Given current concerns for patient exposure during the COVID-19 pandemic, this encounter was conducted via telephone. The patient was notified in advance and was offered a [place] meeting to allow for face to face communication but unfortunately reported that she did not have the appropriate resources/technology to support such a visit and instead preferred to proceed with telephone consult. The patient has given verbal consent for this type of encounter. The time spent during this encounter was 20 minutes. The attendants for this meeting include Orion Vandervort PA-C, patient, Tricia Hernandez  and her daughter, Tricia Hernandez. During the encounter, Eidan Muellner PA-C, was located at [facility] Radiation Oncology Department.  Patient, Tricia Hernandez and her daughter, Tricia Hernandez were located at home.    Nicholos Johns, PA-C

## 2019-03-02 NOTE — Telephone Encounter (Signed)
Copied from [place] [id number]. Topic: Quick Communication - Rx Refill/Question >> Mar 02, 2019  1:18 PM Mcneil, Ja-Kwan wrote: Medication: montelukast (SINGULAIR) 10 MG tablet, PARoxetine (PAXIL) 20 MG tablet, atorvastatin (LIPITOR) 20 MG tablet, and pantoprazole (PROTONIX) 40 MG tablet  Has the patient contacted their pharmacy? no  Preferred Pharmacy (with phone number or street name): [id number] [address], [place] [phone] (Phone)  [phone] (Fax)  Agent: Please be advised that RX refills may take up to 3 business days. We ask that you follow-up with your pharmacy.

## 2019-03-03 MED ORDER — PANTOPRAZOLE SODIUM 40 MG PO TBEC: DELAYED_RELEASE_TABLET | ORAL | 0 refills | Status: DC

## 2019-03-03 MED ORDER — ATORVASTATIN CALCIUM 20 MG PO TABS: 20.0000 mg | ORAL_TABLET | Freq: Every day | ORAL | 0 refills | Status: DC

## 2019-03-03 MED ORDER — MONTELUKAST SODIUM 10 MG PO TABS: 10.0000 mg | ORAL_TABLET | Freq: Every day | ORAL | 0 refills | Status: DC

## 2019-03-03 MED ORDER — PAROXETINE HCL 20 MG PO TABS: 20.0000 mg | ORAL_TABLET | Freq: Every day | ORAL | 0 refills | Status: DC

## 2019-03-03 NOTE — Telephone Encounter (Signed)
Rxs sent. See meds. Patient needs to establish with new PCP.

## 2019-03-05 ENCOUNTER — Ambulatory Visit: Payer: Medicare Other | Admitting: Urology

## 2019-03-08 DIAGNOSIS — L570 Actinic keratosis: Secondary | ICD-10-CM | POA: Diagnosis not present

## 2019-03-08 DIAGNOSIS — C44722 Squamous cell carcinoma of skin of right lower limb, including hip: Secondary | ICD-10-CM | POA: Diagnosis not present

## 2019-03-23 ENCOUNTER — Encounter: Payer: Self-pay | Admitting: Pulmonary Disease

## 2019-03-23 ENCOUNTER — Telehealth: Payer: Self-pay | Admitting: Pulmonary Disease

## 2019-03-23 ENCOUNTER — Ambulatory Visit (INDEPENDENT_AMBULATORY_CARE_PROVIDER_SITE_OTHER): Payer: Medicare Other | Admitting: Pulmonary Disease

## 2019-03-23 ENCOUNTER — Other Ambulatory Visit: Payer: Self-pay

## 2019-03-23 VITALS — BP 122/72 | HR 68 | Temp 98.8°F | Ht 64.0 in | Wt 238.0 lb

## 2019-03-23 DIAGNOSIS — J449 Chronic obstructive pulmonary disease, unspecified: Secondary | ICD-10-CM | POA: Diagnosis not present

## 2019-03-23 DIAGNOSIS — J439 Emphysema, unspecified: Secondary | ICD-10-CM | POA: Insufficient documentation

## 2019-03-23 DIAGNOSIS — R918 Other nonspecific abnormal finding of lung field: Secondary | ICD-10-CM | POA: Diagnosis not present

## 2019-03-23 MED ORDER — BUDESONIDE 0.5 MG/2ML IN SUSP: RESPIRATORY_TRACT | 5 refills | Status: DC

## 2019-03-23 MED ORDER — BROVANA 15 MCG/2ML IN NEBU: 15.0000 ug | INHALATION_SOLUTION | Freq: Two times a day (BID) | RESPIRATORY_TRACT | 5 refills | Status: DC

## 2019-03-23 NOTE — Telephone Encounter (Signed)
Called [facility],[place].  Spoke with Jinny Blossom, Pharmacist.  Jinny Blossom stated Patient's medicare part B was not on file and she would contact Patient for that.  Megan requested a new Brovanna prescription be sent with Patient diagnosis code. New Brovanna prescription with diagnosis code J44.9, sent to [place]. Nothing further at this time.

## 2019-03-23 NOTE — Patient Instructions (Signed)
Continue nebulizers as prescribed I reviewed your recent CT which shows stable lung nodules Follow-up in 6 months.

## 2019-03-23 NOTE — Progress Notes (Signed)
       Tricia Hernandez    [id number]    09/22/1930  Primary Care Physician:Shambley, Ashleigh N, NP  Referring Physician: Shambley, Ashleigh N, NP No address on file  Chief complaint:   Follow up of  COPD Lung nodules. 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 She used to follow Dr. O Neal, Pulmonologist at [place] [place].   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 Work up included CT scan of the chest and PET scan which shows non avid stable lung nodules. Has H/O uncontrolled atrial fibrillation and loaded with Tikosyn. Also has sick sinus rhythm s/p PPM insertion in 2019.    Her anticoagulation was held in 2019 due GI bleed after discussion with Dr. Gessner, GI and Dr. Gudena, Oncology. She has been evaluated by IR for IVC filter. This has been deferred as she did not have any DVT or evidence of hypercoagulability.  She has a remote smoking history of about 5 pack years and exposure to secondhand smoke. Quit smoking in 1975. 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   Interim history: Complains of worsening dyspnea on exertion.  Noted to have drop in hemoglobin from 13 to 10 recently and is due for evaluation by Dr. Gessner  Surveillance CT scan earlier this year shows stable lung nodules Continues on Pulmicort, duo nebs and albuterol nebs as needed. Denies any cough, dyspnea, wheezing.  Outpatient Encounter Medications as of 03/23/2019  Medication Sig  . acetaminophen (TYLENOL) 325 MG tablet Take 650 mg by mouth daily as needed for moderate pain or headache.   . atorvastatin (LIPITOR) 20 MG  tablet Take 1 tablet (20 mg total) by mouth at bedtime. Needs to establish with new provider.  . Biotin 10000 MCG TABS Take 10,000 mcg by mouth at bedtime.   . BROVANA 15 MCG/2ML NEBU INHALE 1 VIAL BY MOUTH VIA NEBULIZER TWICE DAILY  . budesonide (PULMICORT) 0.5 MG/2ML nebulizer solution USE 1 VIAL VIA NEBULIZER TWICE DAILY  . Calcium 600-200 MG-UNIT tablet Take 1 tablet by mouth at bedtime.   . dofetilide (TIKOSYN) 500 MCG capsule TAKE 1 CAPSULE(500 MCG) BY MOUTH TWICE DAILY  . fenofibrate 160 MG tablet TAKE 1 TABLET(160 MG) BY MOUTH DAILY **NEED TO ESTABLISH WITH NEW PROVIDER**  . ferrous sulfate 325 (65 FE) MG EC tablet Take 325 mg by mouth 2 (two) times daily.  . fluticasone (FLONASE) 50 MCG/ACT nasal spray Place 1 spray into both nostrils daily.  . furosemide (LASIX) 20 MG tablet TAKE 1 TABLET(20 MG) BY MOUTH DAILY (Patient taking differently: Take 20 mg by mouth See admin instructions. Every third day)  . ipratropium-albuterol (DUONEB) 0.5-2.5 (3) MG/3ML SOLN Take 3 mLs by nebulization 2 (two) times daily.  . levocetirizine (XYZAL) 5 MG tablet Take 5 mg by mouth at bedtime.  . magnesium oxide (MAGNESIUM-OXIDE) 400 (241.3 Mg) MG tablet Take 1 tablet (400 mg total) by mouth daily.  . Ostomy Supplies KIT 8514 is number on bag one piece unit. Colostomy bag and adhesive and alcohol prep, no sting, cannot use regular prep pad.  . pantoprazole (  PROTONIX) 40 MG tablet TAKE 1 TABLET(40 MG) BY MOUTH DAILY **NEED TO ESTABLISH WITH NEW PROVIDER**  . PARoxetine (PAXIL) 20 MG tablet Take 1 tablet (20 mg total) by mouth daily. Needs to establish with new provider.  . vitamin B-12 (CYANOCOBALAMIN) 1000 MCG tablet Take 1,000 mcg by mouth at bedtime.   . Vitamin D, Ergocalciferol, (DRISDOL) 1.25 MG (50000 UT) CAPS capsule TAKE 1 CAPSULE BY MOUTH 1 TIME A WEEK  . montelukast (SINGULAIR) 10 MG tablet Take 1 tablet (10 mg total) by mouth at bedtime. Needs to establish with new provider. (Patient not taking:  Reported on 03/23/2019)   Facility-Administered Encounter Medications as of 03/23/2019  Medication  . sodium phosphate (FLEET) 7-19 GM/118ML enema 2 enema   Physical Exam: Blood pressure 122/72, pulse 68, temperature 98.8 F (37.1 C), temperature source Oral, height 5' 4" (1.626 m), weight 238 lb (108 kg), SpO2 96 %. Gen:      No acute distress HEENT:  EOMI, sclera anicteric Neck:     No masses; no thyromegaly Lungs:    Clear to auscultation bilaterally; normal respiratory effort CV:         Regular rate and rhythm; no murmurs Abd:      + bowel sounds; soft, non-tender; no palpable masses, no distension Ext:    No edema; adequate peripheral perfusion Skin:      Warm and dry; no rash Neuro: alert and oriented x 3 Psych: normal mood and affect  Data Reviewed: Imaging: CT chest 07/25/16-postsurgical changes in right middle lung, multiple subcentimeter pulmonary nodules. CTA chest 08/27/16-no pulmonary embolus, stable postsurgical changes, multiple subcentimeter pulmonary nodules. Consolidative changes in bilateral lungs, small bilateral pleural effusions. PET scan 09/16/16- resolution of consolidative changes, stable postsurgical changes, stable subcentimeter pulmonary nodules which are non-PET avid CT chest abdomen pelvis 12/30/2017- no evidence of recurrent metastatic disease in the abdomen or pelvis.  New pulmonary nodules in the right lower lobe.  Previously visualized pulmonary nodules are stable. CT chest abdomen pelvis 02/08/2019- no evidence of recurrent cancer.  Lung nodules are stable. I have reviewed all the images personally  PFTs 02/26/27 FVC 1.43 [58%), FEV1 0.82 (45%), F/F 57, TLC 293%, RV/TLC 188% Severe obstruction with bronchodilator response, hyperinflation  Labs: Alpha-1 antitrypsin 05/27/2017-141, PI MM  Assessment:  Severe COPD PFTs show severe obstruction with hyperinflation and bronchodilator response (as shown by improvement in FVC].  Stable on Brovana, Pulmicort.   Continue duo nebs as needed Continue supplemental oxygen during the nighttime.  She does have some worsening dyspnea but does not appear to be related to her COPD.  Due for evaluation for possible GI blood loss given her history and drop in hematocrit Currently off anticoagulation  Pulmonary nodules H/O lung carcinoid s/p resection Follow-up CT reviewed with stable lung nodules.  Plan/Recommendations: - Continue brovana, pulmocort, duonebs  Marshell Garfinkel MD [facility] 03/23/2019, 10:41 AM  CC: Lance Sell, NP

## 2019-03-25 DIAGNOSIS — C44311 Basal cell carcinoma of skin of nose: Secondary | ICD-10-CM | POA: Diagnosis not present

## 2019-03-30 ENCOUNTER — Other Ambulatory Visit (INDEPENDENT_AMBULATORY_CARE_PROVIDER_SITE_OTHER): Payer: Medicare Other

## 2019-03-30 ENCOUNTER — Encounter: Payer: Self-pay | Admitting: Internal Medicine

## 2019-03-30 ENCOUNTER — Ambulatory Visit (INDEPENDENT_AMBULATORY_CARE_PROVIDER_SITE_OTHER): Payer: Medicare Other | Admitting: Internal Medicine

## 2019-03-30 ENCOUNTER — Other Ambulatory Visit: Payer: Self-pay

## 2019-03-30 VITALS — BP 128/78 | HR 88 | Temp 98.8°F | Ht 64.0 in | Wt 237.5 lb

## 2019-03-30 DIAGNOSIS — Z85048 Personal history of other malignant neoplasm of rectum, rectosigmoid junction, and anus: Secondary | ICD-10-CM | POA: Diagnosis not present

## 2019-03-30 DIAGNOSIS — D500 Iron deficiency anemia secondary to blood loss (chronic): Secondary | ICD-10-CM

## 2019-03-30 DIAGNOSIS — K5520 Angiodysplasia of colon without hemorrhage: Secondary | ICD-10-CM | POA: Diagnosis not present

## 2019-03-30 DIAGNOSIS — R110 Nausea: Secondary | ICD-10-CM | POA: Diagnosis not present

## 2019-03-30 LAB — CBC WITH DIFFERENTIAL/PLATELET
Basophils Absolute: 0 10*3/uL (ref 0.0–0.1)
Basophils Relative: 0.5 % (ref 0.0–3.0)
Eosinophils Absolute: 0.3 10*3/uL (ref 0.0–0.7)
Eosinophils Relative: 4 % (ref 0.0–5.0)
HCT: 38.1 % (ref 36.0–46.0)
Hemoglobin: 12.2 g/dL (ref 12.0–15.0)
Lymphocytes Relative: 20.3 % (ref 12.0–46.0)
Lymphs Abs: 1.3 10*3/uL (ref 0.7–4.0)
MCHC: 32.2 g/dL (ref 30.0–36.0)
MCV: 83.4 fl (ref 78.0–100.0)
Monocytes Absolute: 0.5 10*3/uL (ref 0.1–1.0)
Monocytes Relative: 7.4 % (ref 3.0–12.0)
Neutro Abs: 4.4 10*3/uL (ref 1.4–7.7)
Neutrophils Relative %: 67.8 % (ref 43.0–77.0)
Platelets: 156 10*3/uL (ref 150.0–400.0)
RBC: 4.57 Mil/uL (ref 3.87–5.11)
RDW: 16.7 % — ABNORMAL HIGH (ref 11.5–15.5)
WBC: 6.6 10*3/uL (ref 4.0–10.5)

## 2019-03-30 LAB — FERRITIN: Ferritin: 29.3 ng/mL (ref 10.0–291.0)

## 2019-03-30 LAB — VITAMIN B12: Vitamin B-12: 762 pg/mL (ref 211–911)

## 2019-03-30 NOTE — Progress Notes (Signed)
Tricia Hernandez 85 y.o. [birth year] [id number]  Assessment & Plan:   Encounter Diagnoses  Name Primary?  Marland Kitchen Anemia due to chronic blood loss Yes  . Angiodysplasia of intestinal tract   . History of anal cancer   . Nausea     Orders Placed This Encounter  Procedures  . Ferritin  . CBC with Differential/Platelet  . Vitamin B12    We will reassess her hemoglobin iron stores and B12 stores.  I am not inclined to pursue endoscopic evaluation given her age and comorbidities and that we know she has angiodysplasia of the small intestine at least.  Would reserve endoscopy for therapy rather than diagnostic capability and hopefully iron supplementation will do the trick for her.   I will ask Dr. Dema Severin if she needs reassessment regarding her squamous cell carcinoma of the anus, and anoscopy would be the appropriate thing for that and not a sigmoidoscopy as far as I know and if she needed that I would prefer Dr. Dema Severin would do that.  He is the expert in anal cancer not me.  We will monitor her nausea she and I both think it is likely from anxiety and it is very transient and I do not think it raises the bar to trigger any type of investigation right now. Subjective:   Chief Complaint: Anemia and nausea  HPI Here w/ daughter because of decline in hemoglobin.  The patient is an 85 year old woman with a history of anal cell carcinoma status post surgical and radiation therapy.  She also has a diversion colostomy in place.  She is followed in hematology oncology and radiation oncology, and has been followed by Dr. Annye English who is her colorectal surgeon.  In January and March her hemoglobin was 11.2 and 11.6 and then it was 10.9 on June 29.  MCV 87 RDW 15.9 white count normal and platelet count slightly low at 139.  She has not noted any particular bleeding.  A few years ago she did have problems bleeding from her rectum and distal colon and had radiation changes in there but that is not an  issue now, she was on anticoagulants and those were stopped.  A previous capsule endoscopy has shown some small bowel angiodysplasia.  Prior to that she had ablation of duodenal AVMs at EGD this was in May 2018 for both studies.  She has been treated with iron therapy and is generally done well.  She continues to live alone and perform her ADLs.  She ambulates with a walker.  She is eating well and her weight is stable.  She can lie flat without significant dyspnea.  She is not having any heartburn or indigestion or dysphagia and no abdominal pain.  She recently had a basal cell carcinoma resected from her nose and has a bandage in place.  She does complain of intermittent nausea not every day a lot of times in the morning and is just fleeting.  She feels that it is related to stress and sometimes she is noticed that when she gets up quickly though she is not particularly light headed.  She does take Paxil but is been on a long time, though it is causing vivid dreams and they are considering having that changed.  Overall she is sleeping well.  Wt Readings from Last 3 Encounters:  03/30/19 237 lb 8 oz (107.7 kg)  03/23/19 238 lb (108 kg)  11/02/18 234 lb (106.1 kg)   CT of the chest abdomen and  pelvis with contrast on June 29 without evidence of recurrent disease.  She had a PET scan because of some lung lesions in July 2019, and lung lesions were thought to be scarring etc. not malignant and were no signs of any recurrent anal cancer either.  She has not seen Dr. Dema Severin in a while. Allergies  Allergen Reactions  . Penicillins Other (See Comments)    Reaction:  Unknown  Has patient had a PCN reaction causing immediate rash, facial/tongue/throat swelling, SOB or lightheadedness with hypotension: Unsure Has patient had a PCN reaction causing severe rash involving mucus membranes or skin necrosis: Unsure Has patient had a PCN reaction that required hospitalization Unsure Has patient had a PCN reaction  occurring within the last 10 years: Unsure If all of the above answers are "NO", then may proceed with Cephalosporin use.   [name] [Naproxen Sodium] Hives  . Antihistamines, Chlorpheniramine-Type Rash  . Aspirin Rash  . Benadryl [Diphenhydramine Hcl] Palpitations and Other (See Comments)    Increases heart rate  . Codeine Rash  . Hydrochlorothiazide Rash  . Rocephin [Ceftriaxone Sodium In Dextrose] Rash  . Sulfa Antibiotics Rash   Current Meds  Medication Sig  . acetaminophen (TYLENOL) 325 MG tablet Take 650 mg by mouth daily as needed for moderate pain or headache.   . anti-nausea (EMETROL) solution Take 10 mLs by mouth every 15 (fifteen) minutes as needed for nausea or vomiting.  Marland Kitchen arformoterol (BROVANA) 15 MCG/2ML NEBU Take 2 mLs (15 mcg total) by nebulization 2 (two) times daily.  Marland Kitchen atorvastatin (LIPITOR) 20 MG tablet Take 1 tablet (20 mg total) by mouth at bedtime. Needs to establish with new provider.  . Biotin 10000 MCG TABS Take 10,000 mcg by mouth at bedtime.   . budesonide (PULMICORT) 0.5 MG/2ML nebulizer solution USE 1 VIAL VIA NEBULIZER TWICE DAILY  . Calcium 600-200 MG-UNIT tablet Take 1 tablet by mouth at bedtime.   . dofetilide (TIKOSYN) 500 MCG capsule TAKE 1 CAPSULE(500 MCG) BY MOUTH TWICE DAILY  . fenofibrate 160 MG tablet TAKE 1 TABLET(160 MG) BY MOUTH DAILY **NEED TO ESTABLISH WITH NEW PROVIDER**  . ferrous sulfate 325 (65 FE) MG EC tablet Take 325 mg by mouth 2 (two) times daily.  . fluticasone (FLONASE) 50 MCG/ACT nasal spray Place 1 spray into both nostrils daily.  . furosemide (LASIX) 20 MG tablet TAKE 1 TABLET(20 MG) BY MOUTH DAILY (Patient taking differently: Take 20 mg by mouth See admin instructions. Every third day)  . ipratropium-albuterol (DUONEB) 0.5-2.5 (3) MG/3ML SOLN Take 3 mLs by nebulization 2 (two) times daily.  Marland Kitchen levocetirizine (XYZAL) 5 MG tablet Take 5 mg by mouth at bedtime.  . magnesium oxide (MAGNESIUM-OXIDE) 400 (241.3 Mg) MG tablet Take 1  tablet (400 mg total) by mouth daily.  Oneta [employer] Supplies KIT 8514 is number on bag one piece unit. Colostomy bag and adhesive and alcohol prep, no sting, cannot use regular prep pad.  . pantoprazole (PROTONIX) 40 MG tablet TAKE 1 TABLET(40 MG) BY MOUTH DAILY **NEED TO ESTABLISH WITH NEW PROVIDER**  . PARoxetine (PAXIL) 20 MG tablet Take 1 tablet (20 mg total) by mouth daily. Needs to establish with new provider.  . vitamin B-12 (CYANOCOBALAMIN) 1000 MCG tablet Take 1,000 mcg by mouth at bedtime.   . Vitamin D, Ergocalciferol, (DRISDOL) 1.25 MG (50000 UT) CAPS capsule TAKE 1 CAPSULE BY MOUTH 1 TIME A WEEK   Past Medical History:  Diagnosis Date  . Anal squamous cell carcinoma ([place]) 07/2016   "  spread to lymph nodes and groins"   . Anemia   . Anxiety   . Atrial fibrillation ([place])   . Basal cell carcinoma of skin of nose   . CHF (congestive heart failure) ([place]) 11/2015  . Chronic bronchitis ([place])   . Chronic depression   . Colostomy in place [facility]) 07/2016  . Diverticular disease   . Fibrocystic disease of breast   . Fracture of shoulder   . GERD (gastroesophageal reflux disease)   . GI bleed 12/10/2016   in setting of no PPI. 5/2 EGD: grade A reflux esophagitis.  Mild, non-hemorrhagic gastritis.  Ablation of 3 Duodenal AVMs  . Herpes zoster   . History of blood transfusion    for vaginal, uterine bleeding leading to hysterectomy.  in 12/2016 transfused x 1 for GI bleed.   Marland Kitchen History of hiatal hernia   . Hyperlipidemia   . Hypertension   . Labyrinthitis   . Lung cancer ([place])    RML  . Obesity   . On home oxygen therapy    "3L just at night when I sleep" (04/20/2018)  . Paroxysmal supraventricular tachycardia ([place])   . Peptic ulcer of stomach    hx  . Pneumonia 1980s X 1; 08/2016   "walking; double"  . Presence of permanent cardiac pacemaker 04/20/2018  . Pulmonary embolism ([place]) 05/2014   "left lung"  . Seasonal allergies    "bad" (04/20/2018)  . Vitamin D deficiency    Past  Surgical History:  Procedure Laterality Date  . APPENDECTOMY  1959  . BASAL CELL CARCINOMA EXCISION     nose x 2  . BREAST CYST ASPIRATION Right 2016?  Marland Kitchen CATARACT EXTRACTION W/ INTRAOCULAR LENS  IMPLANT, BILATERAL Bilateral 1990s  . CHOLECYSTECTOMY OPEN  1980s  . COLON SURGERY    . COLOSTOMY N/A 07/29/2016   Procedure: COLOSTOMY;  Surgeon: Clovis Riley, MD;  Location: [facility];  Service: General;  Laterality: N/A;  . DILATION AND CURETTAGE OF UTERUS    . ESOPHAGOGASTRODUODENOSCOPY N/A 12/11/2016   Procedure: ESOPHAGOGASTRODUODENOSCOPY (EGD);  Surgeon: Jerene Bears, MD;  Location[facility] ENDOSCOPY;  Service: Endoscopy;  Laterality: N/A;  . EVALUATION UNDER ANESTHESIA WITH HEMORRHOIDECTOMY AND PROCTOSCOPY N/A 07/25/2016   Procedure: EXAM UNDER ANESTHESIA, EXCISION PERIANAL MASS.;  Surgeon: Judeth Horn, MD;  Location: [place] [facility];  Service: General;  Laterality: N/A;  Prone position  . FLEXIBLE SIGMOIDOSCOPY N/A 06/16/2017   Procedure: FLEXIBLE SIGMOIDOSCOPY EXAM UNDER ANESHESIA;  Surgeon: Ileana Roup, MD;  Location: [facility];  Service: General;  Laterality: N/A;  . INSERT / REPLACE / REMOVE PACEMAKER  04/20/2018  . IR RADIOLOGIST EVAL & MGMT  03/25/2017  . LAPAROSCOPIC DIVERTED COLOSTOMY N/A 07/29/2016   Procedure: ATTEMPTED LAPAROSCOPIC ASSISTED COLOSTOMY;  Surgeon: Clovis Riley, MD;  Location: [facility];  Service: General;  Laterality: N/A;  . LUNG REMOVAL, PARTIAL Right 2013   RML mass/carcinoid, Dr Lurena Nida  . PACEMAKER IMPLANT N/A 04/20/2018   Procedure: PACEMAKER IMPLANT;  Surgeon: Evans Lance, MD;  Location: [facility];  Service: Cardiovascular;  Laterality: N/A;  . SQUAMOUS CELL CARCINOMA EXCISION     rectum, right leg  . TONSILLECTOMY AND ADENOIDECTOMY  1960s  . VAGINAL HYSTERECTOMY  1959   "partial"   Social History   Social History Narrative   Patient is widowed and retired she has 5 children, she lives alone and performs all of her ADLs   Former smoker, never  alcohol never drug use   family  history includes Breast cancer in her sister; CVA in her mother; Cancer in her brother; Cervical cancer in her daughter; Heart Problems in her brother; Heart attack in her mother; Hypertension in her mother; Leukemia in her daughter and son; Multiple myeloma in her mother; Prostate cancer in her brother; Skin cancer in her sister; Throat cancer in her brother and brother.   Review of Systems As above   Objective:   Physical Exam BP 128/78 (BP Location: Left Arm, Patient Position: Sitting, Cuff Size: Normal)   Pulse 88   Temp 98.8 F (37.1 C)   Ht _0  (1.626 m)   Wt 237 lb 8 oz (107.7 kg)   BMI 40.77 kg/m  Obese NAD'ambulates w/ assistance LLQ ostomy abd obese soft NT bowel sounds present Alert and oriented x3 Appropriate mood and affect Slightly pale but no rashes in the skin Trace lower extremity edema bilateral  Data reviewed, see HPI

## 2019-03-30 NOTE — Patient Instructions (Signed)
  Your provider has requested that you go to the basement level for lab work before leaving today. Press "B" on the elevator. The lab is located at the first door on the left as you exit the elevator.    I appreciate the opportunity to care for you. Carl Gessner, MD, FACG 

## 2019-04-01 ENCOUNTER — Encounter: Payer: Self-pay | Admitting: Internal Medicine

## 2019-04-02 NOTE — Progress Notes (Signed)
Ms. [place],  The blood counts, iron and B12 levels are ok.  We will sit tight.  Not sure why the hemoglobin went down before.Stay on the iron supplement.  No follow-up with me needed right now but happy to see you if needed  I appreciate the opportunity to care for you. Gatha Mayer, MD, [facility]

## 2019-04-05 ENCOUNTER — Telehealth: Payer: Self-pay | Admitting: Pulmonary Disease

## 2019-04-05 MED ORDER — BUDESONIDE 0.5 MG/2ML IN SUSP: RESPIRATORY_TRACT | 5 refills | Status: DC

## 2019-04-05 MED ORDER — BROVANA 15 MCG/2ML IN NEBU: 15.0000 ug | INHALATION_SOLUTION | Freq: Two times a day (BID) | RESPIRATORY_TRACT | 5 refills | Status: DC

## 2019-04-05 NOTE — Telephone Encounter (Signed)
Called and spoke with patient's Daughter, Collie Siad [facility]).  Collie Siad requested a new prescription for nebs to be sent to [facility]. Collie Siad stated [facility] over [facility]. Garlon Hatchet and Budesmide refills sent to requested [place].  Nothing further at this time.

## 2019-04-06 ENCOUNTER — Telehealth: Payer: Self-pay | Admitting: Pulmonary Disease

## 2019-04-06 NOTE — Telephone Encounter (Signed)
Called and spoke with patient daughter Cammy Copa. She said they have been trying to get Brovana and Budesonide filled for over a month and the new prescription was sent to [facility] who told them they (pharmacy) would contact Medicare to obtain form and then fax it to [place] so the prior authorization can be done.  Collie Siad stated she does not understand why it was required as her mother has been using this medication for a couple years. I told her that some insurances will only cover for a specific time or the medication will change tiers on the formulary which requires and prior authorization.  Advised her I will try to find the form needed to get the process started. She voiced understanding.  Called [facility] at [phone] and confirmed a "CMN" form would be faxed by their office that handles the requests for Medicare prescriptions. She stated stated it was a form that required the diagnosis code, justification for issuance of the prescription. I asked for the form to be sent to the attention of "Jayani Rozman/Triage". She will request the form be resent.  Message kept in triage as FYI until form has been received, completed.

## 2019-04-07 NOTE — Telephone Encounter (Signed)
I don't have anything on this patient. If the form was coming from [place] I wouldn't have had that form anyway.

## 2019-04-07 NOTE — Telephone Encounter (Signed)
We still haven't received a CMN for pt's nebs  Called [facility] again and spoke with pharmacist Dolores Lory who reported that their 3rd party pharmacy will need to be notified to send CMN again.  Offered to call myself but Dolores Lory reported it has to come them (pharmacy).  Called and spoke with Collie Siad and updated her on our status on getting patient's neb

## 2019-04-07 NOTE — Telephone Encounter (Signed)
Rodena Piety have you received a CMN?  Thank you!

## 2019-04-08 NOTE — Telephone Encounter (Signed)
I received the fax from [facility] for Dr. Vaughan Browner to sign. It has been placed on his desk to sign. Once it is signed I will fax it to the pharmacy asap.

## 2019-04-08 NOTE — Telephone Encounter (Signed)
I have not received anything on this patient, but I have not worked with Dr Vaughan Browner this week.  Will route to [place], since she has worked with Dr Vaughan Browner this week

## 2019-04-08 NOTE — Telephone Encounter (Signed)
Will forward to [place] and Lattie Haw to look out for CMN.

## 2019-04-09 NOTE — Telephone Encounter (Signed)
Form has been faxed and I have received confirmation that fax was received

## 2019-04-09 NOTE — Telephone Encounter (Signed)
Form has been signed by Dr. Vaughan Browner and Rodena Piety has faxed it back. Documentation will be scanned into the chart.

## 2019-04-13 ENCOUNTER — Telehealth: Payer: Self-pay | Admitting: *Deleted

## 2019-04-13 NOTE — Telephone Encounter (Signed)
LMOVM requesting call back to DC. Gave direct number for return call.  Received [place] alert for increased AT/AF burden in recent weeks. HVR episode also noted on 04/11/19 at 01:24, appears AF w/RVR, duration 25sec. V rates controlled overall. Will confirm compliance with dofetilide and ask if symptomatic.

## 2019-04-20 ENCOUNTER — Ambulatory Visit (INDEPENDENT_AMBULATORY_CARE_PROVIDER_SITE_OTHER): Payer: Medicare Other | Admitting: *Deleted

## 2019-04-20 DIAGNOSIS — I495 Sick sinus syndrome: Secondary | ICD-10-CM | POA: Diagnosis not present

## 2019-04-20 LAB — CUP PACEART REMOTE DEVICE CHECK
Battery Remaining Longevity: 97 mo
Battery Remaining Percentage: 95.5 %
Battery Voltage: 2.99 V
Brady Statistic AP VP Percent: 15 %
Brady Statistic AP VS Percent: 78 %
Brady Statistic AS VP Percent: 1 %
Brady Statistic AS VS Percent: 6.5 %
Brady Statistic RA Percent Paced: 87 %
Brady Statistic RV Percent Paced: 16 %
Date Time Interrogation Session: 20200907060013
Implantable Lead Implant Date: 20190909
Implantable Lead Implant Date: 20190909
Implantable Lead Location: 753859
Implantable Lead Location: 753860
Implantable Lead Model: 3830
Implantable Pulse Generator Implant Date: 20190909
Lead Channel Impedance Value: 390 Ohm
Lead Channel Impedance Value: 440 Ohm
Lead Channel Pacing Threshold Amplitude: 0.625 V
Lead Channel Pacing Threshold Amplitude: 0.75 V
Lead Channel Pacing Threshold Pulse Width: 0.5 ms
Lead Channel Pacing Threshold Pulse Width: 1 ms
Lead Channel Sensing Intrinsic Amplitude: 2.2 mV
Lead Channel Sensing Intrinsic Amplitude: 2.9 mV
Lead Channel Setting Pacing Amplitude: 1.625
Lead Channel Setting Pacing Amplitude: 2.5 V
Lead Channel Setting Pacing Pulse Width: 1 ms
Lead Channel Setting Sensing Sensitivity: 2 mV
Pulse Gen Model: 2272
Pulse Gen Serial Number: 9061801

## 2019-04-20 NOTE — Telephone Encounter (Signed)
Patient daughter Collie Siad called. She confirmed that pt is taking the dofetilide. I informed her that I would send note to Device Tech RN and have her call her back. Pt daughter stated to call her back at [phone].

## 2019-04-20 NOTE — Telephone Encounter (Signed)
Pt daughter returning call. She stated that patient is complaining of her Left arm swelling.

## 2019-04-20 NOTE — Telephone Encounter (Signed)
LMOM for daughter to call clinic. Received alert for episode of AF with RVR that was short in duration.

## 2019-04-20 NOTE — Telephone Encounter (Signed)
Spoke with pt's daughter. She reports that pt's left arm appeared more swollen than her right arm on Monday, new issue over the weekend. She reports pt told her that swelling is no longer evident today. No other associated symptoms, not aware of any recent trauma to LUE. Encouraged f/u with PCP for recommendations, explained they may want a LUE doppler to assess for clot. Per previous OV notes (05/2017 note from R. Charlcie Cradle, PA), pt is no longer on [place] due to AVMs. Pt's daughter confirms this. She agrees to call PCP tomorrow for recommendations.  With regard to increased AT/AF burden, pt's daughter reports she occasionally states she has fast HRs that last for short periods of time. No other reported symptoms. Advised to discuss at upcoming appointment with Dr. Lovena Le on 04/28/19. Pt's daughter denies any additional questions at this time and agrees to call back with any concerns prior to OV next week.

## 2019-04-26 ENCOUNTER — Other Ambulatory Visit (INDEPENDENT_AMBULATORY_CARE_PROVIDER_SITE_OTHER): Payer: Medicare Other

## 2019-04-26 ENCOUNTER — Encounter: Payer: Self-pay | Admitting: Family

## 2019-04-26 ENCOUNTER — Ambulatory Visit (INDEPENDENT_AMBULATORY_CARE_PROVIDER_SITE_OTHER): Payer: Medicare Other | Admitting: Family

## 2019-04-26 ENCOUNTER — Other Ambulatory Visit: Payer: Self-pay

## 2019-04-26 VITALS — BP 118/76 | HR 96 | Temp 98.3°F | Ht 64.0 in | Wt 236.8 lb

## 2019-04-26 DIAGNOSIS — E785 Hyperlipidemia, unspecified: Secondary | ICD-10-CM

## 2019-04-26 DIAGNOSIS — F4323 Adjustment disorder with mixed anxiety and depressed mood: Secondary | ICD-10-CM | POA: Diagnosis not present

## 2019-04-26 DIAGNOSIS — E782 Mixed hyperlipidemia: Secondary | ICD-10-CM

## 2019-04-26 DIAGNOSIS — N390 Urinary tract infection, site not specified: Secondary | ICD-10-CM

## 2019-04-26 DIAGNOSIS — Z23 Encounter for immunization: Secondary | ICD-10-CM | POA: Diagnosis not present

## 2019-04-26 DIAGNOSIS — Z87440 Personal history of urinary (tract) infections: Secondary | ICD-10-CM

## 2019-04-26 LAB — COMPREHENSIVE METABOLIC PANEL
ALT: 11 U/L (ref 0–35)
AST: 23 U/L (ref 0–37)
Albumin: 4.3 g/dL (ref 3.5–5.2)
Alkaline Phosphatase: 49 U/L (ref 39–117)
BUN: 22 mg/dL (ref 6–23)
CO2: 33 mEq/L — ABNORMAL HIGH (ref 19–32)
Calcium: 9.6 mg/dL (ref 8.4–10.5)
Chloride: 99 mEq/L (ref 96–112)
Creatinine, Ser: 1.18 mg/dL (ref 0.40–1.20)
GFR: 43.45 mL/min — ABNORMAL LOW (ref >60.00)
Glucose, Bld: 87 mg/dL (ref 70–99)
Potassium: 4.6 mEq/L (ref 3.5–5.1)
Sodium: 140 mEq/L (ref 135–145)
Total Bilirubin: 0.4 mg/dL (ref 0.2–1.2)
Total Protein: 6.9 g/dL (ref 6.0–8.3)

## 2019-04-26 LAB — LIPID PANEL
Cholesterol: 182 mg/dL (ref 0–200)
HDL: 62.4 mg/dL (ref >39.00)
LDL Cholesterol: 90 mg/dL (ref 0–99)
NonHDL: 119.32
Total CHOL/HDL Ratio: 3
Triglycerides: 145 mg/dL (ref 0.0–149.0)
VLDL: 29 mg/dL (ref 0.0–40.0)

## 2019-04-26 MED ORDER — PAROXETINE HCL 20 MG PO TABS: 20.0000 mg | ORAL_TABLET | Freq: Two times a day (BID) | ORAL | 3 refills | Status: DC

## 2019-04-26 MED ORDER — ATORVASTATIN CALCIUM 20 MG PO TABS: 20.0000 mg | ORAL_TABLET | Freq: Every day | ORAL | 3 refills | Status: DC

## 2019-04-26 NOTE — Patient Instructions (Signed)
Please try taking 1/2 tablet of the Paxil in the am and 1 in the pm; if the 1/2 tablet in the morning is not helpful, please try taking a full tablet of the Paxil in the am and the pm; please let me know how you respond;  It is okay to hold the Fenofibrate for now;   Please schedule a follow-up with your orthopedist about your right knee.

## 2019-04-26 NOTE — Progress Notes (Signed)
Tricia Hernandez is a 85 y.o. female with the following history as recorded in EpicCare:  Patient Active Problem List   Diagnosis Date Noted  . COPD with chronic bronchitis and emphysema ([place]) 03/23/2019  . Anxiety and depression 11/02/2018  . Allergic rhinitis 11/02/2018  . Postsurgical cardiac pacemaker in situ 04/20/2018  . Sinus node dysfunction ([place]) 04/14/2018  . Vitamin D deficiency 05/27/2017  . Venous stasis dermatitis of both lower extremities 02/23/2017  . Anemia 02/22/2017  . Acid reflux 02/22/2017  . Colostomy care ([place]) 12/20/2016  . Angiodysplasia of duodenum   . GI bleeding 12/10/2016  . Mixed hyperlipidemia 11/05/2016  . Chronic bronchitis ([place]) 11/04/2016  . Persistent atrial fibrillation 10/07/2016  . Acute respiratory failure with hypoxia ([place]) 08/26/2016  . Essential hypertension   . Pulmonary nodules   . Palliative care encounter   . Anal cancer ([place]) 07/22/2016  . Atrial fibrillation ([place]), CHA2DS2-VASc Score 5 07/22/2016  . Pulmonary embolus ([place])   . Obesity   . Lung cancer [facility])     Current Outpatient Medications  Medication Sig Dispense Refill  . acetaminophen (TYLENOL) 325 MG tablet Take 650 mg by mouth daily as needed for moderate pain or headache.     . anti-nausea (EMETROL) solution Take 10 mLs by mouth every 15 (fifteen) minutes as needed for nausea or vomiting.    Marland Kitchen arformoterol (BROVANA) 15 MCG/2ML NEBU Take 2 mLs (15 mcg total) by nebulization 2 (two) times daily. 120 mL 5  . atorvastatin (LIPITOR) 20 MG tablet Take 1 tablet (20 mg total) by mouth at bedtime. 90 tablet 3  . Biotin 10000 MCG TABS Take 10,000 mcg by mouth at bedtime.     . budesonide (PULMICORT) 0.5 MG/2ML nebulizer solution USE 1 VIAL VIA NEBULIZER TWICE DAILY 360 mL 5  . Calcium 600-200 MG-UNIT tablet Take 1 tablet by mouth at bedtime.     . dofetilide (TIKOSYN) 500 MCG capsule TAKE 1 CAPSULE(500 MCG) BY MOUTH TWICE DAILY 180 capsule 2  . ferrous sulfate 325 (65 FE) MG EC  tablet Take 325 mg by mouth 2 (two) times daily.    . fluticasone (FLONASE) 50 MCG/ACT nasal spray Place 1 spray into both nostrils daily.    . furosemide (LASIX) 20 MG tablet TAKE 1 TABLET(20 MG) BY MOUTH DAILY (Patient taking differently: daily as needed. TAKE 1 TABLET(20 MG) BY MOUTH DAILY) 90 tablet 0  . ipratropium-albuterol (DUONEB) 0.5-2.5 (3) MG/3ML SOLN Take 3 mLs by nebulization 2 (two) times daily. 540 mL 0  . magnesium oxide (MAG-OX) 400 MG tablet Take by mouth.    . magnesium oxide (MAGNESIUM-OXIDE) 400 (241.3 Mg) MG tablet Take 1 tablet (400 mg total) by mouth daily. 90 tablet 1  . Ostomy Supplies KIT 8514 is number on bag one piece unit. Colostomy bag and adhesive and alcohol prep, no sting, cannot use regular prep pad. 30 each 11  . PARoxetine (PAXIL) 20 MG tablet Take 1 tablet (20 mg total) by mouth 2 (two) times daily. 180 tablet 3  . vitamin B-12 (CYANOCOBALAMIN) 1000 MCG tablet Take 1,000 mcg by mouth at bedtime.     . Vitamin D, Ergocalciferol, (DRISDOL) 1.25 MG (50000 UT) CAPS capsule TAKE 1 CAPSULE BY MOUTH 1 TIME A WEEK 12 capsule 0  . levocetirizine (XYZAL) 5 MG tablet Take 5 mg by mouth at bedtime.  6  . pantoprazole (PROTONIX) 40 MG tablet TAKE 1 TABLET(40 MG) BY MOUTH DAILY **NEED TO ESTABLISH WITH NEW PROVIDER** (Patient not taking:  Reported on 04/26/2019) 90 tablet 0   No current facility-administered medications for this visit.    Facility-Administered Medications Ordered in Other Visits  Medication Dose Route Frequency Provider Last Rate Last Dose  . sodium phosphate (FLEET) 7-19 GM/118ML enema 2 enema  2 enema Rectal Once White, Christopher M, MD        Allergies: Penicillins; Aleve [naproxen sodium]; Antihistamines, chlorpheniramine-type; Aspirin; Benadryl [diphenhydramine hcl]; Codeine; Hydrochlorothiazide; Rocephin [ceftriaxone sodium in dextrose]; and Sulfa antibiotics  Past Medical History:  Diagnosis Date  . 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 Anemia   . Anxiety   . Atrial fibrillation (HCC)   . Basal cell carcinoma of skin of nose   . CHF (congestive heart failure) (HCC) 11/2015  . Chronic bronchitis (HCC)   . Chronic depression   . Colostomy in place (HCC) 07/2016  . Diverticular disease   . Fibrocystic disease of breast   . Fracture of shoulder   . GERD (gastroesophageal reflux disease)   . GI bleed 12/10/2016   in setting of no PPI. 5/2 EGD: grade A reflux esophagitis.  Mild, non-hemorrhagic gastritis.  Ablation of 3 Duodenal AVMs  . Herpes zoster   . History of blood transfusion    for vaginal, uterine bleeding leading to hysterectomy.  in 12/2016 transfused x 1 for GI bleed.   . History of hiatal hernia   . Hyperlipidemia   . Hypertension   . Labyrinthitis   . Lung cancer (HCC)    RML  . Obesity   . On home oxygen therapy    "3L just at night when I sleep" (04/20/2018)  . Paroxysmal supraventricular tachycardia (HCC)   . Peptic ulcer of stomach    hx  . Pneumonia 1980s X 1; 08/2016   "walking; double"  . Presence of permanent cardiac pacemaker 04/20/2018  . Pulmonary embolism (HCC) 05/2014   "left lung"  . Seasonal allergies    "bad" (04/20/2018)  . Vitamin D deficiency     Past Surgical History:  Procedure Laterality Date  . APPENDECTOMY  1959  . BASAL CELL CARCINOMA EXCISION     nose x 2  . BREAST CYST ASPIRATION Right 2016?  . CATARACT EXTRACTION W/ INTRAOCULAR LENS  IMPLANT, BILATERAL Bilateral 1990s  . CHOLECYSTECTOMY OPEN  1980s  . COLON SURGERY    . COLOSTOMY N/A 07/29/2016   Procedure: COLOSTOMY;  Surgeon: Chelsea A Connor, MD;  Location: [facility];  Service: General;  Laterality: N/A;  . DILATION AND CURETTAGE OF UTERUS    . ESOPHAGOGASTRODUODENOSCOPY N/A 12/11/2016   Procedure: ESOPHAGOGASTRODUODENOSCOPY (EGD);  Surgeon: Jay M Pyrtle, MD;  Location: [facility];  Service: Endoscopy;  Laterality: N/A;  . EVALUATION UNDER ANESTHESIA WITH HEMORRHOIDECTOMY AND  PROCTOSCOPY N/A 07/25/2016   Procedure: EXAM UNDER ANESTHESIA, EXCISION PERIANAL MASS.;  Surgeon: James Wyatt, MD;  Location: [facility] OR;  Service: General;  Laterality: N/A;  Prone position  . FLEXIBLE SIGMOIDOSCOPY N/A 06/16/2017   Procedure: FLEXIBLE SIGMOIDOSCOPY EXAM UNDER ANESHESIA;  Surgeon: White, Christopher M, MD;  Location: [facility];  Service: General;  Laterality: N/A;  . INSERT / REPLACE / REMOVE PACEMAKER  04/20/2018  . IR RADIOLOGIST EVAL & MGMT  03/25/2017  . LAPAROSCOPIC DIVERTED COLOSTOMY N/A 07/29/2016   Procedure: ATTEMPTED LAPAROSCOPIC ASSISTED COLOSTOMY;  Surgeon: Chelsea A Connor, MD;  Location: [facility];  Service: General;  Laterality: N/A;  . LUNG REMOVAL, PARTIAL Right 2013   RML mass/carcinoid, Dr Sweezer  .   PACEMAKER IMPLANT N/A 04/20/2018   Procedure: PACEMAKER IMPLANT;  Surgeon: Evans Lance, MD;  Location: [facility];  Service: Cardiovascular;  Laterality: N/A;  . SQUAMOUS CELL CARCINOMA EXCISION     rectum, right leg  . TONSILLECTOMY AND ADENOIDECTOMY  1960s  . VAGINAL HYSTERECTOMY  1959   "partial"    Family History  Problem Relation Age of Onset  . Heart attack Mother        Dec 1987  . CVA Mother   . Hypertension Mother   . Multiple myeloma Mother   . Breast cancer Sister   . Skin cancer Sister   . Prostate cancer Brother   . Throat cancer Brother   . Cervical cancer Daughter   . Leukemia Daughter   . Leukemia Son   . Throat cancer Brother   . Cancer Brother        soft palette  . Heart Problems Brother        Pacemaker  . Colon cancer Neg Hx   . Pancreatic cancer Neg Hx     Social History   Tobacco Use  . Smoking status: Former Smoker    Packs/day: 0.00    Types: Cigarettes  . Smokeless tobacco: Never Used  . Tobacco comment: 04/20/2018 "smoked once in awhile in the 1960s; not more that 4 packs total in my whole life"  Substance Use Topics  . Alcohol use: Never    Frequency: Never    Subjective:  Accompanied by daughter who  provides majority of history;  Under care of GI, pulmonology and cardiology; does have dermatology- history of BCC and Squamous Cell;  Does feel like her anxiety has been more problematic recently- lives alone; takes 20 mg of Paxil daily; Daughter is asking if one of her cholesterol medications can be stopped- in reviewing notes, it looks like it was recommended to stop Fenofibrate last year but this was not changed; Would like to get flu shot as well;       Objective:  Vitals:   04/26/19 1013  BP: 118/76  Pulse: 96  Temp: 98.3 F (36.8 C)  TempSrc: Oral  SpO2: 96%  Weight: 236 lb 12.8 oz (107.4 kg)  Height: 5' 4" (1.626 m)    General: Well developed, well nourished, in no acute distress  Skin : Warm and dry.  Head: Normocephalic and atraumatic  Lungs: Respirations unlabored; clear to auscultation bilaterally without wheeze, rales, rhonchi  CVS exam: normal rate and regular rhythm.  Extremities: No edema, cyanosis, clubbing  Vessels: Symmetric bilaterally  Neurologic: Alert and oriented; speech intact; face symmetrical; moves all extremities well; CNII-XII intact without focal deficit   Assessment:  1. Hyperlipidemia, unspecified hyperlipidemia type   2. Mixed hyperlipidemia   3. Adjustment disorder with mixed anxiety and depressed mood   4. Recurrent UTI   5. Flu vaccine need     Plan:  1. Update lipid panel today; okay to hold Fenofibrate as previously discussed; 3. Increase Paxil as discussed- can try taking bid; follow up with response; 4. Check urine culture today; 5. Flu vaccine given;  30 minutes spent with patient today  No follow-ups on file.  Orders Placed This Encounter  Procedures  . Urine Culture    Standing Status:   Future    Number of Occurrences:   1    Standing Expiration Date:   04/25/2020  . Flu Vaccine QUAD High Dose(Fluad)  . Lipid panel    Standing Status:   Future  Number of Occurrences:   1    Standing Expiration Date:   04/25/2020  .  Comp Met (CMET)    Standing Status:   Future    Number of Occurrences:   1    Standing Expiration Date:   04/25/2020    Requested Prescriptions   Signed Prescriptions Disp Refills  . atorvastatin (LIPITOR) 20 MG tablet 90 tablet 3    Sig: Take 1 tablet (20 mg total) by mouth at bedtime.  Marland Kitchen PARoxetine (PAXIL) 20 MG tablet 180 tablet 3    Sig: Take 1 tablet (20 mg total) by mouth 2 (two) times daily.

## 2019-04-28 ENCOUNTER — Ambulatory Visit (INDEPENDENT_AMBULATORY_CARE_PROVIDER_SITE_OTHER): Payer: Medicare Other | Admitting: Internal Medicine

## 2019-04-28 ENCOUNTER — Other Ambulatory Visit: Payer: Self-pay

## 2019-04-28 ENCOUNTER — Encounter: Payer: Self-pay | Admitting: Internal Medicine

## 2019-04-28 ENCOUNTER — Other Ambulatory Visit: Payer: Self-pay | Admitting: Hematology and Oncology

## 2019-04-28 VITALS — BP 136/76 | HR 63 | Ht 64.0 in | Wt 236.0 lb

## 2019-04-28 DIAGNOSIS — Z950 Presence of cardiac pacemaker: Secondary | ICD-10-CM | POA: Diagnosis not present

## 2019-04-28 DIAGNOSIS — I495 Sick sinus syndrome: Secondary | ICD-10-CM

## 2019-04-28 DIAGNOSIS — I4819 Other persistent atrial fibrillation: Secondary | ICD-10-CM | POA: Diagnosis not present

## 2019-04-28 NOTE — Progress Notes (Signed)
HPI Tricia Hernandez returns today for followup. She is a pleasant 85 yo obese woman with ah/o HTN, PAF, on dofetilide, and sinus node dysfunction s/p PPM insertion. She admits to dietary indiscretion. Back in early August she had some increased atrial fib. Better now.  Allergies  Allergen Reactions  . Penicillins Other (See Comments)    Reaction:  Unknown  Has patient had a PCN reaction causing immediate rash, facial/tongue/throat swelling, SOB or lightheadedness with hypotension: Unsure Has patient had a PCN reaction causing severe rash involving mucus membranes or skin necrosis: Unsure Has patient had a PCN reaction that required hospitalization Unsure Has patient had a PCN reaction occurring within the last 10 years: Unsure If all of the above answers are "NO", then may proceed with Cephalosporin use.   [name] [Naproxen Sodium] Hives  . Antihistamines, Chlorpheniramine-Type Rash  . Aspirin Rash  . Benadryl [Diphenhydramine Hcl] Palpitations and Other (See Comments)    Increases heart rate  . Codeine Rash  . Hydrochlorothiazide Rash  . Rocephin [Ceftriaxone Sodium In Dextrose] Rash  . Sulfa Antibiotics Rash     Current Outpatient Medications  Medication Sig Dispense Refill  . acetaminophen (TYLENOL) 325 MG tablet Take 650 mg by mouth daily as needed for moderate pain or headache.     . anti-nausea (EMETROL) solution Take 10 mLs by mouth every 15 (fifteen) minutes as needed for nausea or vomiting.    Marland Kitchen arformoterol (BROVANA) 15 MCG/2ML NEBU Take 2 mLs (15 mcg total) by nebulization 2 (two) times daily. 120 mL 5  . atorvastatin (LIPITOR) 20 MG tablet Take 1 tablet (20 mg total) by mouth at bedtime. 90 tablet 3  . Biotin 10000 MCG TABS Take 10,000 mcg by mouth at bedtime.     . budesonide (PULMICORT) 0.5 MG/2ML nebulizer solution USE 1 VIAL VIA NEBULIZER TWICE DAILY 360 mL 5  . Calcium 600-200 MG-UNIT tablet Take 1 tablet by mouth at bedtime.     . dofetilide (TIKOSYN) 500  MCG capsule TAKE 1 CAPSULE(500 MCG) BY MOUTH TWICE DAILY 180 capsule 2  . ferrous sulfate 325 (65 FE) MG EC tablet Take 325 mg by mouth 2 (two) times daily.    . fluticasone (FLONASE) 50 MCG/ACT nasal spray Place 1 spray into both nostrils daily.    . furosemide (LASIX) 20 MG tablet TAKE 1 TABLET(20 MG) BY MOUTH DAILY (Patient taking differently: daily as needed. TAKE 1 TABLET(20 MG) BY MOUTH DAILY) 90 tablet 0  . ipratropium-albuterol (DUONEB) 0.5-2.5 (3) MG/3ML SOLN Take 3 mLs by nebulization 2 (two) times daily. 540 mL 0  . levocetirizine (XYZAL) 5 MG tablet Take 5 mg by mouth at bedtime.  6  . magnesium oxide (MAG-OX) 400 MG tablet Take by mouth.    . magnesium oxide (MAGNESIUM-OXIDE) 400 (241.3 Mg) MG tablet Take 1 tablet (400 mg total) by mouth daily. 90 tablet 1  . Ostomy Supplies KIT 8514 is number on bag one piece unit. Colostomy bag and adhesive and alcohol prep, no sting, cannot use regular prep pad. 30 each 11  . pantoprazole (PROTONIX) 40 MG tablet TAKE 1 TABLET(40 MG) BY MOUTH DAILY **NEED TO ESTABLISH WITH NEW PROVIDER** 90 tablet 0  . PARoxetine (PAXIL) 20 MG tablet Take 1 tablet (20 mg total) by mouth 2 (two) times daily. 180 tablet 3  . vitamin B-12 (CYANOCOBALAMIN) 1000 MCG tablet Take 1,000 mcg by mouth at bedtime.     . Vitamin D, Ergocalciferol, (DRISDOL) 1.25 MG (50000 UT)  CAPS capsule TAKE 1 CAPSULE BY MOUTH 1 TIME A WEEK 12 capsule 0   No current facility-administered medications for this visit.    Facility-Administered Medications Ordered in Other Visits  Medication Dose Route Frequency Provider Last Rate Last Dose  . sodium phosphate (FLEET) 7-19 GM/118ML enema 2 enema  2 enema Rectal Once Ileana Roup, MD         Past Medical History:  Diagnosis Date  . Anal squamous cell carcinoma ([place]) 07/2016   "spread to lymph nodes and groins"   . Anemia   . Anxiety   . Atrial fibrillation ([place])   . Basal cell carcinoma of skin of nose   . CHF (congestive heart  failure) ([place]) 11/2015  . Chronic bronchitis ([place])   . Chronic depression   . Colostomy in place [facility]) 07/2016  . Diverticular disease   . Fibrocystic disease of breast   . Fracture of shoulder   . GERD (gastroesophageal reflux disease)   . GI bleed 12/10/2016   in setting of no PPI. 5/2 EGD: grade A reflux esophagitis.  Mild, non-hemorrhagic gastritis.  Ablation of 3 Duodenal AVMs  . Herpes zoster   . History of blood transfusion    for vaginal, uterine bleeding leading to hysterectomy.  in 12/2016 transfused x 1 for GI bleed.   Marland Kitchen History of hiatal hernia   . Hyperlipidemia   . Hypertension   . Labyrinthitis   . Lung cancer ([place])    RML  . Obesity   . On home oxygen therapy    "3L just at night when I sleep" (04/20/2018)  . Paroxysmal supraventricular tachycardia ([place])   . Peptic ulcer of stomach    hx  . Pneumonia 1980s X 1; 08/2016   "walking; double"  . Presence of permanent cardiac pacemaker 04/20/2018  . Pulmonary embolism ([place]) 05/2014   "left lung"  . Seasonal allergies    "bad" (04/20/2018)  . Vitamin D deficiency     ROS:   All systems reviewed and negative except as noted in the HPI.   Past Surgical History:  Procedure Laterality Date  . APPENDECTOMY  1959  . BASAL CELL CARCINOMA EXCISION     nose x 2  . BREAST CYST ASPIRATION Right 2016?  Marland Kitchen CATARACT EXTRACTION W/ INTRAOCULAR LENS  IMPLANT, BILATERAL Bilateral 1990s  . CHOLECYSTECTOMY OPEN  1980s  . COLON SURGERY    . COLOSTOMY N/A 07/29/2016   Procedure: COLOSTOMY;  Surgeon: Clovis Riley, MD;  Location: [place];  Service: General;  Laterality: N/A;  . DILATION AND CURETTAGE OF UTERUS    . ESOPHAGOGASTRODUODENOSCOPY N/A 12/11/2016   Procedure: ESOPHAGOGASTRODUODENOSCOPY (EGD);  Surgeon: Jerene Bears, MD;  Location: [facility] ENDOSCOPY;  Service: Endoscopy;  Laterality: N/A;  . EVALUATION UNDER ANESTHESIA WITH HEMORRHOIDECTOMY AND PROCTOSCOPY N/A 07/25/2016   Procedure: EXAM UNDER ANESTHESIA, EXCISION PERIANAL  MASS.;  Surgeon: Judeth Horn, MD;  Location: [facility];  Service: General;  Laterality: N/A;  Prone position  . FLEXIBLE SIGMOIDOSCOPY N/A 06/16/2017   Procedure: FLEXIBLE SIGMOIDOSCOPY EXAM UNDER ANESHESIA;  Surgeon: Ileana Roup, MD;  Location: [facility];  Service: General;  Laterality: N/A;  . INSERT / REPLACE / REMOVE PACEMAKER  04/20/2018  . IR RADIOLOGIST EVAL & MGMT  03/25/2017  . LAPAROSCOPIC DIVERTED COLOSTOMY N/A 07/29/2016   Procedure: ATTEMPTED LAPAROSCOPIC ASSISTED COLOSTOMY;  Surgeon: Clovis Riley, MD;  Location: [facility];  Service: General;  Laterality: N/A;  . LUNG REMOVAL, PARTIAL Right 2013   RML  mass/carcinoid, Dr Lurena Nida  . PACEMAKER IMPLANT N/A 04/20/2018   Procedure: PACEMAKER IMPLANT;  Surgeon: Evans Lance, MD;  Location: [place];  Service: Cardiovascular;  Laterality: N/A;  . SQUAMOUS CELL CARCINOMA EXCISION     rectum, right leg  . TONSILLECTOMY AND ADENOIDECTOMY  1960s  . VAGINAL HYSTERECTOMY  1959   "partial"     Family History  Problem Relation Age of Onset  . Heart attack Mother        Dec 1987  . CVA Mother   . Hypertension Mother   . Multiple myeloma Mother   . Breast cancer Sister   . Skin cancer Sister   . Prostate cancer Brother   . Throat cancer Brother   . Cervical cancer Daughter   . Leukemia Daughter   . Leukemia Son   . Throat cancer Brother   . Cancer Brother        soft palette  . Heart Problems Brother        Pacemaker  . Colon cancer Neg Hx   . Pancreatic cancer Neg Hx      Social History   Socioeconomic History  . Marital status: Widowed    Spouse name: Not on file  . Number of children: 5  . Years of education: Not on file  . Highest education level: Not on file  Occupational History  . Occupation: retired  Scientific laboratory technician  . Financial resource strain: Not on file  . Food insecurity    Worry: Not on file    Inability: Not on file  . Transportation needs    Medical: Not on file    Non-medical: Not  on file  Tobacco Use  . Smoking status: Former Smoker    Packs/day: 0.00    Types: Cigarettes  . Smokeless tobacco: Never Used  . Tobacco comment: 04/20/2018 "smoked once in awhile in the 1960s; not more that 4 packs total in my whole life"  Substance and Sexual Activity  . Alcohol use: Never    Frequency: Never  . Drug use: Never  . Sexual activity: Not Currently  Lifestyle  . Physical activity    Days per week: Not on file    Minutes per session: Not on file  . Stress: Not on file  Relationships  . Social Herbalist on phone: Not on file    Gets together: Not on file    Attends religious service: Not on file    Active member of club or organization: Not on file    Attends meetings of clubs or organizations: Not on file    Relationship status: Not on file  . Intimate partner violence    Fear of current or ex partner: Not on file    Emotionally abused: Not on file    Physically abused: Not on file    Forced sexual activity: Not on file  Other Topics Concern  . Not on file  Social History Narrative   Patient is widowed and retired she has 5 children, she lives alone and performs all of her ADLs   Former smoker, never alcohol never drug use     BP 136/76   Pulse 63   Ht 5' 4" (1.626 m)   Wt 236 lb (107 kg)   SpO2 94%   BMI 40.51 kg/m   Physical Exam:  Well appearing NAD HEENT: Unremarkable Neck:  6 cm JVD, no thyromegally Lymphatics:  No adenopathy Back:  No CVA tenderness Lungs:  Clear with no wheezes HEART:  Regular rate rhythm, no murmurs, no rubs, no clicks Abd:  soft, positive bowel sounds, no organomegally, no rebound, no guarding Ext:  2 plus pulses, no edema, no cyanosis, no clubbing Skin:  No rashes no nodules Neuro:  CN II through XII intact, motor grossly intact  EKG - nsr with AV pacing  DEVICE  Normal device function.  See PaceArt for details.   Assess/Plan: 1. Sinus node dysfunction - she is asymptomatic, s/p PPM insertion.  2.  PAF - her overall burden is around 3%. She has settled down from her increased episodes back in August. Her daughter thinks that she may have been somewhat non-compliant.  3. PPM - her St. Jude DDD PM is working normally.  4. Obesity - I have asked that she lose 5 lbs when she sees me again in 5 months.  [email or web address] [email or web address].

## 2019-04-28 NOTE — Patient Instructions (Signed)
Medication Instructions:  Your physician recommends that you continue on your current medications as directed. Please refer to the Current Medication list given to you today.  Labwork: None ordered.  Testing/Procedures: None ordered.  Follow-Up: Your physician wants you to follow-up in: one year with Dr. Lovena Le.   You will receive a reminder letter in the mail two months in advance. If you don't receive a letter, please call our office to schedule the follow-up appointment.  Remote monitoring is used to monitor your Pacemaker from home. This monitoring reduces the number of office visits required to check your device to one time per year. It allows [place] to keep an eye on the functioning of your device to ensure it is working properly. You are scheduled for a device check from home on 07/20/2019. You may send your transmission at any time that day. If you have a wireless device, the transmission will be sent automatically. After your physician reviews your transmission, you will receive a postcard with your next transmission date.  Any Other Special Instructions Will Be Listed Below (If Applicable).  If you need a refill on your cardiac medications before your next appointment, please call your pharmacy.

## 2019-04-29 LAB — URINE CULTURE
MICRO NUMBER:: 877413
SPECIMEN QUALITY:: ADEQUATE

## 2019-04-30 ENCOUNTER — Other Ambulatory Visit: Payer: Self-pay | Admitting: Family

## 2019-04-30 MED ORDER — NITROFURANTOIN MONOHYD MACRO 100 MG PO CAPS: 100.0000 mg | ORAL_CAPSULE | Freq: Two times a day (BID) | ORAL | 0 refills | Status: DC

## 2019-05-06 ENCOUNTER — Encounter: Payer: Self-pay | Admitting: Cardiology

## 2019-05-06 NOTE — Progress Notes (Signed)
Remote pacemaker transmission.   

## 2019-05-12 ENCOUNTER — Other Ambulatory Visit: Payer: Self-pay | Admitting: Family

## 2019-05-26 ENCOUNTER — Other Ambulatory Visit: Payer: Self-pay | Admitting: Internal Medicine

## 2019-05-26 DIAGNOSIS — F4323 Adjustment disorder with mixed anxiety and depressed mood: Secondary | ICD-10-CM

## 2019-05-26 DIAGNOSIS — J42 Unspecified chronic bronchitis: Secondary | ICD-10-CM

## 2019-05-26 NOTE — Telephone Encounter (Signed)
Tricia Hernandez pt

## 2019-06-13 ENCOUNTER — Other Ambulatory Visit: Payer: Self-pay | Admitting: Internal Medicine

## 2019-06-13 DIAGNOSIS — E782 Mixed hyperlipidemia: Secondary | ICD-10-CM

## 2019-06-24 DIAGNOSIS — M25561 Pain in right knee: Secondary | ICD-10-CM | POA: Diagnosis not present

## 2019-06-24 DIAGNOSIS — M1711 Unilateral primary osteoarthritis, right knee: Secondary | ICD-10-CM | POA: Diagnosis not present

## 2019-06-24 DIAGNOSIS — G8929 Other chronic pain: Secondary | ICD-10-CM | POA: Diagnosis not present

## 2019-07-07 ENCOUNTER — Other Ambulatory Visit: Payer: Self-pay | Admitting: Internal Medicine

## 2019-07-07 ENCOUNTER — Other Ambulatory Visit: Payer: Self-pay | Admitting: *Deleted

## 2019-07-07 MED ORDER — VITAMIN D (ERGOCALCIFEROL) 1.25 MG (50000 UNIT) PO CAPS: ORAL_CAPSULE | ORAL | 0 refills | Status: DC

## 2019-07-12 DIAGNOSIS — Z961 Presence of intraocular lens: Secondary | ICD-10-CM | POA: Diagnosis not present

## 2019-07-12 DIAGNOSIS — H353111 Nonexudative age-related macular degeneration, right eye, early dry stage: Secondary | ICD-10-CM | POA: Diagnosis not present

## 2019-07-19 LAB — CUP PACEART REMOTE DEVICE CHECK
Battery Remaining Longevity: 106 mo
Battery Remaining Percentage: 95.5 %
Battery Voltage: 2.99 V
Brady Statistic AP VP Percent: 1 %
Brady Statistic AP VS Percent: 93 %
Brady Statistic AS VP Percent: 1 %
Brady Statistic AS VS Percent: 6.6 %
Brady Statistic RA Percent Paced: 91 %
Brady Statistic RV Percent Paced: 1 %
Date Time Interrogation Session: 20201207020015
Implantable Lead Implant Date: 20190909
Implantable Lead Implant Date: 20190909
Implantable Lead Location: 753859
Implantable Lead Location: 753860
Implantable Lead Model: 3830
Implantable Pulse Generator Implant Date: 20190909
Lead Channel Impedance Value: 400 Ohm
Lead Channel Impedance Value: 450 Ohm
Lead Channel Pacing Threshold Amplitude: 0.625 V
Lead Channel Pacing Threshold Amplitude: 0.75 V
Lead Channel Pacing Threshold Pulse Width: 0.5 ms
Lead Channel Pacing Threshold Pulse Width: 1 ms
Lead Channel Sensing Intrinsic Amplitude: 1.2 mV
Lead Channel Sensing Intrinsic Amplitude: 3.1 mV
Lead Channel Setting Pacing Amplitude: 1.625
Lead Channel Setting Pacing Amplitude: 2.5 V
Lead Channel Setting Pacing Pulse Width: 1 ms
Lead Channel Setting Sensing Sensitivity: 0.7 mV
Pulse Gen Model: 2272
Pulse Gen Serial Number: 9061801

## 2019-07-20 ENCOUNTER — Ambulatory Visit (INDEPENDENT_AMBULATORY_CARE_PROVIDER_SITE_OTHER): Payer: Medicare Other | Admitting: *Deleted

## 2019-07-20 DIAGNOSIS — Z950 Presence of cardiac pacemaker: Secondary | ICD-10-CM | POA: Diagnosis not present

## 2019-07-30 ENCOUNTER — Other Ambulatory Visit: Payer: Self-pay | Admitting: Internal Medicine

## 2019-07-31 ENCOUNTER — Other Ambulatory Visit: Payer: Self-pay | Admitting: Family

## 2019-08-24 LAB — CUP PACEART INCLINIC DEVICE CHECK
Date Time Interrogation Session: 20200916100619
Implantable Lead Implant Date: 20190909
Implantable Lead Implant Date: 20190909
Implantable Lead Location: 753859
Implantable Lead Location: 753860
Implantable Lead Model: 3830
Implantable Pulse Generator Implant Date: 20190909
Pulse Gen Model: 2272
Pulse Gen Serial Number: 9061801

## 2019-08-24 NOTE — Progress Notes (Signed)
PPM remote 

## 2019-09-03 ENCOUNTER — Telehealth: Payer: Self-pay | Admitting: Internal Medicine

## 2019-09-03 NOTE — Telephone Encounter (Signed)
New Message  We are recommending the COVID-19 vaccine to all of our patients. Cardiac medications (including blood thinners) should not deter anyone from being vaccinated and there is no need to hold any of those medications prior to vaccine administration.     Currently, there is a hotline to call (active 08/20/19) to schedule vaccination appointments as no walk-ins will be accepted.   Number: [phone].    If an appointment is not available please go to [email or web address] to sign up for notification when additional vaccine appointments are available.   If you have further questions or concerns about the vaccine process, please visit [email or web address] or contact your primary care physician.   

## 2019-09-06 ENCOUNTER — Telehealth: Payer: Self-pay | Admitting: *Deleted

## 2019-09-06 NOTE — Telephone Encounter (Signed)
PPM alert received for AT/AF burden/duration and elevated V rates. Presenting rhythm from 09/05/19 at 02:00 shows AF/VP, VS 70-108bpm. AF episode ongoing since 09/03/19. Stored AMS episodes show AF w/RVR at times. Next f/u with Dr. Lovena Le due in 04/2020. Per previous notes, patient is not on [place] due to a history of AVMs.  Spoke with patient's daughter, Collie Siad ([place]). Collie Siad reports pt has been dealing with spells of nausea, using OTC Emetrol. She has not had emesis. Reports pt has been complaint with cardiac medications, including Tikosyn. Collie Siad agrees to speak with patient regarding any symptoms, and will request pt's daily vital signs log.   Collie Siad obtained recent vital signs. Pt denies increased SOB, chest discomfort, or dizziness. Pt is able to feel her heart beat at night when she lays down.  Saturday, 09/04/19: 119/72, O2 96%, HR 78 Sunday, 09/05/19: 110/69, HR 53 Monday, 09/06/19: 121/69, O2 95%, HR 63   Advised message would be forwarded to Dr. Lovena Le for review and recommendations. Collie Siad in agreement with plan. Aware to call back if new or increased symptoms. No additional questions at this time.

## 2019-09-09 NOTE — Telephone Encounter (Signed)
Per Dr. Lovena Le- if Pt is asymptomatic-he recommends watchful waiting.

## 2019-09-09 NOTE — Telephone Encounter (Signed)
Spoke with daughter. Patient is feeling better with less nausea. Will follow AF burden through [employer], NP 09/09/2019 9:27 AM

## 2019-09-18 ENCOUNTER — Other Ambulatory Visit: Payer: Self-pay | Admitting: Hematology and Oncology

## 2019-09-27 DIAGNOSIS — C44311 Basal cell carcinoma of skin of nose: Secondary | ICD-10-CM | POA: Diagnosis not present

## 2019-09-29 ENCOUNTER — Other Ambulatory Visit: Payer: Self-pay | Admitting: Internal Medicine

## 2019-09-29 MED ORDER — DOFETILIDE 500 MCG PO CAPS: ORAL_CAPSULE | ORAL | 1 refills | Status: DC

## 2019-09-29 NOTE — Telephone Encounter (Signed)
Pt's medication was sent to pt's pharmacy as requested. Confirmation received.  °

## 2019-10-13 DIAGNOSIS — N952 Postmenopausal atrophic vaginitis: Secondary | ICD-10-CM | POA: Diagnosis not present

## 2019-10-13 DIAGNOSIS — N816 Rectocele: Secondary | ICD-10-CM | POA: Diagnosis not present

## 2019-10-13 DIAGNOSIS — N8111 Cystocele, midline: Secondary | ICD-10-CM | POA: Diagnosis not present

## 2019-10-19 ENCOUNTER — Ambulatory Visit (INDEPENDENT_AMBULATORY_CARE_PROVIDER_SITE_OTHER): Payer: Medicare Other | Admitting: *Deleted

## 2019-10-19 DIAGNOSIS — Z950 Presence of cardiac pacemaker: Secondary | ICD-10-CM

## 2019-10-19 LAB — CUP PACEART REMOTE DEVICE CHECK
Battery Remaining Longevity: 106 mo
Battery Remaining Percentage: 95.5 %
Battery Voltage: 2.99 V
Brady Statistic AP VP Percent: 1 %
Brady Statistic AP VS Percent: 93 %
Brady Statistic AS VP Percent: 1 %
Brady Statistic AS VS Percent: 6.8 %
Brady Statistic RA Percent Paced: 91 %
Brady Statistic RV Percent Paced: 1.2 %
Date Time Interrogation Session: 20210309020013
Implantable Lead Implant Date: 20190909
Implantable Lead Implant Date: 20190909
Implantable Lead Location: 753859
Implantable Lead Location: 753860
Implantable Lead Model: 3830
Implantable Pulse Generator Implant Date: 20190909
Lead Channel Impedance Value: 400 Ohm
Lead Channel Impedance Value: 460 Ohm
Lead Channel Pacing Threshold Amplitude: 0.75 V
Lead Channel Pacing Threshold Amplitude: 1 V
Lead Channel Pacing Threshold Pulse Width: 0.5 ms
Lead Channel Pacing Threshold Pulse Width: 1 ms
Lead Channel Sensing Intrinsic Amplitude: 2 mV
Lead Channel Sensing Intrinsic Amplitude: 3.2 mV
Lead Channel Setting Pacing Amplitude: 2 V
Lead Channel Setting Pacing Amplitude: 2.5 V
Lead Channel Setting Pacing Pulse Width: 1 ms
Lead Channel Setting Sensing Sensitivity: 0.7 mV
Pulse Gen Model: 2272
Pulse Gen Serial Number: 9061801

## 2019-10-20 NOTE — Progress Notes (Signed)
PPM Remote  

## 2019-11-01 ENCOUNTER — Ambulatory Visit (INDEPENDENT_AMBULATORY_CARE_PROVIDER_SITE_OTHER): Payer: Medicare Other | Admitting: Family

## 2019-11-01 ENCOUNTER — Encounter: Payer: Self-pay | Admitting: Family

## 2019-11-01 ENCOUNTER — Other Ambulatory Visit: Payer: Self-pay

## 2019-11-01 VITALS — BP 138/80 | HR 67 | Temp 98.0°F | Ht 64.0 in | Wt 234.2 lb

## 2019-11-01 DIAGNOSIS — E782 Mixed hyperlipidemia: Secondary | ICD-10-CM

## 2019-11-01 DIAGNOSIS — Z950 Presence of cardiac pacemaker: Secondary | ICD-10-CM | POA: Diagnosis not present

## 2019-11-01 DIAGNOSIS — J449 Chronic obstructive pulmonary disease, unspecified: Secondary | ICD-10-CM

## 2019-11-01 DIAGNOSIS — N390 Urinary tract infection, site not specified: Secondary | ICD-10-CM

## 2019-11-01 DIAGNOSIS — I10 Essential (primary) hypertension: Secondary | ICD-10-CM | POA: Diagnosis not present

## 2019-11-01 DIAGNOSIS — F329 Major depressive disorder, single episode, unspecified: Secondary | ICD-10-CM

## 2019-11-01 DIAGNOSIS — F419 Anxiety disorder, unspecified: Secondary | ICD-10-CM | POA: Diagnosis not present

## 2019-11-01 DIAGNOSIS — F4323 Adjustment disorder with mixed anxiety and depressed mood: Secondary | ICD-10-CM

## 2019-11-01 LAB — LDL CHOLESTEROL, DIRECT: Direct LDL: 75 mg/dL

## 2019-11-01 LAB — COMPREHENSIVE METABOLIC PANEL
ALT: 34 U/L (ref 0–35)
AST: 32 U/L (ref 0–37)
Albumin: 4 g/dL (ref 3.5–5.2)
Alkaline Phosphatase: 91 U/L (ref 39–117)
BUN: 19 mg/dL (ref 6–23)
CO2: 34 mEq/L — ABNORMAL HIGH (ref 19–32)
Calcium: 8.9 mg/dL (ref 8.4–10.5)
Chloride: 102 mEq/L (ref 96–112)
Creatinine, Ser: 0.88 mg/dL (ref 0.40–1.20)
GFR: 60.88 mL/min (ref >60.00)
Glucose, Bld: 89 mg/dL (ref 70–99)
Potassium: 4.8 mEq/L (ref 3.5–5.1)
Sodium: 140 mEq/L (ref 135–145)
Total Bilirubin: 0.4 mg/dL (ref 0.2–1.2)
Total Protein: 6.4 g/dL (ref 6.0–8.3)

## 2019-11-01 LAB — LIPID PANEL
Cholesterol: 153 mg/dL (ref 0–200)
HDL: 48.8 mg/dL (ref >39.00)
NonHDL: 103.8
Total CHOL/HDL Ratio: 3
Triglycerides: 231 mg/dL — ABNORMAL HIGH (ref 0.0–149.0)
VLDL: 46.2 mg/dL — ABNORMAL HIGH (ref 0.0–40.0)

## 2019-11-01 MED ORDER — PAROXETINE HCL 20 MG PO TABS: 20.0000 mg | ORAL_TABLET | Freq: Two times a day (BID) | ORAL | 3 refills | Status: DC

## 2019-11-01 NOTE — Progress Notes (Signed)
Tricia Hernandez is a 86 y.o. female with the following history as recorded in EpicCare:  Patient Active Problem List   Diagnosis Date Noted  . COPD with chronic bronchitis and emphysema ([place]) 03/23/2019  . Anxiety and depression 11/02/2018  . Allergic rhinitis 11/02/2018  . Pacemaker 04/20/2018  . Sinus node dysfunction ([place]) 04/14/2018  . Vitamin D deficiency 05/27/2017  . Venous stasis dermatitis of both lower extremities 02/23/2017  . Anemia 02/22/2017  . Acid reflux 02/22/2017  . Colostomy care ([place]) 12/20/2016  . Angiodysplasia of duodenum   . GI bleeding 12/10/2016  . Mixed hyperlipidemia 11/05/2016  . Chronic bronchitis ([place]) 11/04/2016  . Persistent atrial fibrillation ([place]) 10/07/2016  . Acute respiratory failure with hypoxia ([place]) 08/26/2016  . Essential hypertension   . Pulmonary nodules   . Palliative care encounter   . Anal cancer ([place]) 07/22/2016  . Atrial fibrillation ([place]), CHA2DS2-VASc Score 5 07/22/2016  . Pulmonary embolus ([place])   . Obesity   . Lung cancer [facility])     Current Outpatient Medications  Medication Sig Dispense Refill  . acetaminophen (TYLENOL) 325 MG tablet Take 650 mg by mouth daily as needed for moderate pain or headache.     . anti-nausea (EMETROL) solution Take 10 mLs by mouth every 15 (fifteen) minutes as needed for nausea or vomiting.    Marland Kitchen arformoterol (BROVANA) 15 MCG/2ML NEBU Take 2 mLs (15 mcg total) by nebulization 2 (two) times daily. 120 mL 5  . atorvastatin (LIPITOR) 20 MG tablet TAKE 1 TABLET (20 MG TOTAL) BY MOUTH AT BEDTIME. NEEDS TO ESTABLISH WITH NEW PROVIDER. 90 tablet 3  . Biotin 10000 MCG TABS Take 10,000 mcg by mouth at bedtime.     . budesonide (PULMICORT) 0.5 MG/2ML nebulizer solution USE 1 VIAL VIA NEBULIZER TWICE DAILY 360 mL 5  . Calcium 600-200 MG-UNIT tablet Take 1 tablet by mouth at bedtime.     . dofetilide (TIKOSYN) 500 MCG capsule TAKE 1 CAPSULE(500 MCG) BY MOUTH TWICE DAILY 180 capsule 1  . ferrous sulfate 325 (65  FE) MG EC tablet Take 325 mg by mouth 2 (two) times daily.    . fluticasone (FLONASE) 50 MCG/ACT nasal spray Place 1 spray into both nostrils daily.    . furosemide (LASIX) 20 MG tablet TAKE 1 TABLET(20 MG) BY MOUTH DAILY (Patient taking differently: daily as needed. TAKE 1 TABLET(20 MG) BY MOUTH DAILY) 90 tablet 0  . ipratropium-albuterol (DUONEB) 0.5-2.5 (3) MG/3ML SOLN Take 3 mLs by nebulization 2 (two) times daily. 540 mL 0  . levocetirizine (XYZAL) 5 MG tablet Take 5 mg by mouth at bedtime.  6  . magnesium oxide (MAG-OX) 400 MG tablet Take by mouth.    [employer] KIT 8514 is number on bag one piece unit. Colostomy bag and adhesive and alcohol prep, no sting, cannot use regular prep pad. 30 each 11  . pantoprazole (PROTONIX) 40 MG tablet TAKE 1 TABLET(40 MG) BY MOUTH DAILY 90 tablet 0  . PARoxetine (PAXIL) 20 MG tablet TAKE 1 TABLET (20 MG TOTAL) BY MOUTH DAILY. NEEDS TO ESTABLISH WITH NEW PROVIDER. 90 tablet 1  . vitamin B-12 (CYANOCOBALAMIN) 1000 MCG tablet Take 1,000 mcg by mouth at bedtime.     . Vitamin D, Ergocalciferol, (DRISDOL) 1.25 MG (50000 UNIT) CAPS capsule TAKE 1 CAPSULE BY MOUTH 1 TIME A WEEK 12 capsule 0   No current facility-administered medications for this visit.   Facility-Administered Medications Ordered in Other Visits  Medication Dose Route Frequency  Provider Last Rate Last Admin  . sodium phosphate (FLEET) 7-19 GM/118ML enema 2 enema  2 enema Rectal Once Ileana Roup, MD        Allergies: Penicillins; Aleve [naproxen sodium]; Antihistamines, chlorpheniramine-type; Aspirin; Benadryl [diphenhydramine hcl]; Codeine; Hydrochlorothiazide; Rocephin [ceftriaxone sodium in dextrose]; and Sulfa antibiotics  Past Medical History:  Diagnosis Date  . 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 Anemia   . Anxiety   . Atrial fibrillation ([place])   . Basal cell carcinoma of skin of nose   . CHF (congestive heart failure)  ([place]) 11/2015  . Chronic bronchitis ([place])   . Chronic depression   . Colostomy in place [facility]) 07/2016  . Diverticular disease   . Fibrocystic disease of breast   . Fracture of shoulder   . GERD (gastroesophageal reflux disease)   . GI bleed 12/10/2016   in setting of no PPI. 5/2 EGD: grade A reflux esophagitis.  Mild, non-hemorrhagic gastritis.  Ablation of 3 Duodenal AVMs  . Herpes zoster   . History of blood transfusion    for vaginal, uterine bleeding leading to hysterectomy.  in 12/2016 transfused x 1 for GI bleed.   Marland Kitchen History of hiatal hernia   . Hyperlipidemia   . Hypertension   . Labyrinthitis   . Lung cancer ([place])    RML  . Obesity   . On home oxygen therapy    "3L just at night when I sleep" (04/20/2018)  . Paroxysmal supraventricular tachycardia ([place])   . Peptic ulcer of stomach    hx  . Pneumonia 1980s X 1; 08/2016   "walking; double"  . Presence of permanent cardiac pacemaker 04/20/2018  . Pulmonary embolism ([place]) 05/2014   "left lung"  . Seasonal allergies    "bad" (04/20/2018)  . Vitamin D deficiency     Past Surgical History:  Procedure Laterality Date  . APPENDECTOMY  1959  . BASAL CELL CARCINOMA EXCISION     nose x 2  . BREAST CYST ASPIRATION Right 2016?  Marland Kitchen CATARACT EXTRACTION W/ INTRAOCULAR LENS  IMPLANT, BILATERAL Bilateral 1990s  . CHOLECYSTECTOMY OPEN  1980s  . COLON SURGERY    . COLOSTOMY N/A 07/29/2016   Procedure: COLOSTOMY;  Surgeon: Clovis Riley, MD;  Location: [place];  Service: General;  Laterality: N/A;  . DILATION AND CURETTAGE OF UTERUS    . ESOPHAGOGASTRODUODENOSCOPY N/A 12/11/2016   Procedure: ESOPHAGOGASTRODUODENOSCOPY (EGD);  Surgeon: Jerene Bears, MD;  Location: [facility] ENDOSCOPY;  Service: Endoscopy;  Laterality: N/A;  . EVALUATION UNDER ANESTHESIA WITH HEMORRHOIDECTOMY AND PROCTOSCOPY N/A 07/25/2016   Procedure: EXAM UNDER ANESTHESIA, EXCISION PERIANAL MASS.;  Surgeon: Judeth Horn, MD;  Location: [place];  Service: General;  Laterality: N/A;   Prone position  . FLEXIBLE SIGMOIDOSCOPY N/A 06/16/2017   Procedure: FLEXIBLE SIGMOIDOSCOPY EXAM UNDER ANESHESIA;  Surgeon: Ileana Roup, MD;  Location: [facility];  Service: General;  Laterality: N/A;  . INSERT / REPLACE / REMOVE PACEMAKER  04/20/2018  . IR RADIOLOGIST EVAL & MGMT  03/25/2017  . LAPAROSCOPIC DIVERTED COLOSTOMY N/A 07/29/2016   Procedure: ATTEMPTED LAPAROSCOPIC ASSISTED COLOSTOMY;  Surgeon: Clovis Riley, MD;  Location: [place];  Service: General;  Laterality: N/A;  . LUNG REMOVAL, PARTIAL Right 2013   RML mass/carcinoid, Dr Lurena Nida  . PACEMAKER IMPLANT N/A 04/20/2018   Procedure: PACEMAKER IMPLANT;  Surgeon: Evans Lance, MD;  Location: [facility];  Service: Cardiovascular;  Laterality: N/A;  .  SQUAMOUS CELL CARCINOMA EXCISION     rectum, right leg  . TONSILLECTOMY AND ADENOIDECTOMY  1960s  . VAGINAL HYSTERECTOMY  1959   "partial"    Family History  Problem Relation Age of Onset  . Heart attack Mother        Dec 1987  . CVA Mother   . Hypertension Mother   . Multiple myeloma Mother   . Breast cancer Sister   . Skin cancer Sister   . Prostate cancer Brother   . Throat cancer Brother   . Cervical cancer Daughter   . Leukemia Daughter   . Leukemia Son   . Throat cancer Brother   . Cancer Brother        soft palette  . Heart Problems Brother        Pacemaker  . Colon cancer Neg Hx   . Pancreatic cancer Neg Hx     Social History   Tobacco Use  . Smoking status: Former Smoker    Packs/day: 0.00    Types: Cigarettes  . Smokeless tobacco: Never Used  . Tobacco comment: 04/20/2018 "smoked once in awhile in the 1960s; not more that 4 packs total in my whole life"  Substance Use Topics  . Alcohol use: Never    Subjective:   6 month follow up on hyperlipidemia/ anxiety; accompanied by daughter;  Is taking Paroxetine twice a day- does feel this is working better for her;  Needs to re-check cholesterol today- stopped Fenofibrate at last OV;   Has seen dermatology and cardiology since last OV here- no acute changes; has been told to use Lasix only as needed;  Will be getting second Moderna vaccine in the next week;   Objective:  Vitals:   11/01/19 0902  BP: 138/80  Pulse: 67  Temp: 98 F (36.7 C)  TempSrc: Oral  SpO2: 96%  Weight: 234 lb 3.2 oz (106.2 kg)  Height: 5' 4"  (1.626 m)    General: Well developed, well nourished, in no acute distress  Skin : Warm and dry.  Head: Normocephalic and atraumatic  Eyes: Sclera and conjunctiva clear; pupils round and reactive to light; extraocular movements intact  Ears: External normal; canals clear; tympanic membranes normal  Oropharynx: Pink, supple. No suspicious lesions  Neck: Supple without thyromegaly, adenopathy  Lungs: Respirations unlabored; clear to auscultation bilaterally without wheeze, rales, rhonchi  CVS exam: normal rate and regular rhythm.  Neurologic: Alert and oriented; speech intact; face symmetrical; moves all extremities well; CNII-XII intact without focal deficit   Assessment:  1. Essential hypertension   2. Mixed hyperlipidemia   3. Recurrent UTI   4. COPD with chronic bronchitis and emphysema ([place])   5. Pacemaker     Plan:  Patient is doing very well today; will update lipid panel today;  Saw GYN last week and no UTI at this time;  Okay to be seen here in 1 year- continue with specialists as scheduled;  This visit occurred during the SARS-CoV-2 public health emergency.  Safety protocols were in place, including screening questions prior to the visit, additional usage of staff PPE, and extensive cleaning of exam room while observing appropriate contact time as indicated for disinfecting solutions.     Return in about 1 year (around 10/31/2020).  Orders Placed This Encounter  Procedures  . Comp Met (CMET)  . Lipid panel    Requested Prescriptions    No prescriptions requested or ordered in this encounter

## 2019-11-19 ENCOUNTER — Other Ambulatory Visit: Payer: Self-pay | Admitting: Family

## 2019-11-19 ENCOUNTER — Telehealth: Payer: Self-pay | Admitting: Family

## 2019-11-19 DIAGNOSIS — F4323 Adjustment disorder with mixed anxiety and depressed mood: Secondary | ICD-10-CM

## 2019-11-19 MED ORDER — PAROXETINE HCL 40 MG PO TABS: 20.0000 mg | ORAL_TABLET | Freq: Two times a day (BID) | ORAL | 3 refills | Status: DC

## 2019-11-19 NOTE — Telephone Encounter (Signed)
Her insurance does not want to pay for the Paxil 20 mg twice a day. Would her daughter be comfortable cutting a 40 mg tablet in 1/2 for her? They will pay for this with no issue.

## 2019-12-11 ENCOUNTER — Other Ambulatory Visit: Payer: Self-pay | Admitting: Hematology and Oncology

## 2020-01-12 ENCOUNTER — Telehealth: Payer: Self-pay | Admitting: *Deleted

## 2020-01-12 NOTE — Telephone Encounter (Signed)
Received call from pt with complaint of constant headache and bilateral ear pain x 3 weeks unrelieved by tylenol.  Pt concerned for brain mets due to a family hx.  Per MD pt needing to be seen next week for further evaluation and treatment.  Apt scheduled and pt verbalized understanding of apt date and time.

## 2020-01-16 NOTE — Progress Notes (Signed)
HEMATOLOGY-ONCOLOGY MYCHART VIDEO VISIT PROGRESS NOTE  I connected with Tricia Hernandez on 01/17/2020 at  1:30 PM EDT by MyChart video conference and verified that I am speaking with the correct person using two identifiers.  I discussed the limitations, risks, security and privacy concerns of performing an evaluation and management service by MyChart and the availability of in person appointments.  I also discussed with the patient that there may be a patient responsible charge related to this service. The patient expressed understanding and agreed to proceed.  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 female with above-mentioned history of perianal squamous cell carcinoma that was treated with annual radiation. She has recently experienced a constant headache and bilateral ear pain x3 weeks relieved with 1-2 tylenols.sebaceous cyst recurrence on top of head. She presents over MyChart today for follow-up.   Her son reports that she has had more extensive joint stiffness and achiness but otherwise she is tolerating it well.  Today she came in in a wheelchair.  Oncology History  Lung cancer [facility])  Anal cancer ([place])  07/25/2016 Initial Diagnosis   Perianal: Squamous cell cancer atleast 5 cm, Moderately differentiated T2N1 (lung nodules, inguinal LN unclear etiology) stage 3B vs Stage 4   08/13/2016 -  Radiation Therapy   Anal Radiation (diverting colostomy)    Observations/Objective:  There were no vitals filed for this visit. There is no height or weight on file to calculate BMI.  I have reviewed the data as listed CMP Latest Ref Rng & Units 11/01/2019 04/26/2019 02/08/2019  Glucose 70 - 99 mg/dL 89 87 90  BUN 6 - 23 mg/dL 19 22 21   Creatinine 0.40 - 1.20 mg/dL 0.88 1.18 1.10(H)  Sodium 135 - 145 mEq/L 140 140 143  Potassium 3.5 - 5.1 mEq/L 4.8 4.6 4.9  Chloride  96 - 112 mEq/L 102 99 104  CO2 19 - 32 mEq/L 34(H) 33(H) 30  Calcium 8.4 - 10.5 mg/dL 8.9 9.6 9.0  Total Protein 6.0 - 8.3 g/dL 6.4 6.9 6.7  Total Bilirubin 0.2 - 1.2 mg/dL 0.4 0.4 0.3  Alkaline Phos 39 - 117 U/L 91 49 51  AST 0 - 37 U/L 32 23 23  ALT 0 - 35 U/L 34 11 12    Lab Results  Component Value Date   WBC 6.6 03/30/2019   HGB 12.2 03/30/2019   HCT 38.1 03/30/2019   MCV 83.4 03/30/2019   PLT 156.0 03/30/2019   NEUTROABS 4.4 03/30/2019      Assessment Plan:  Anal cancer ([place]) Squamous cell carcinoma of the anus: Patient has bilateral inguinal lymphadenopathy and bilateral lung nodules. It is unclear but the inguinal lymphadenopathy and the lung nodulescould be metastatic disease. it could be a stage IIIa versus stage IV.  Treatmentsummary 08/13/2016-09/25/2016: XRT to anus S/P diverting colostomy  CT CAP:12/30/2016: Nonspecific wall thickening posteriorly lower left rectum inguinal adenopathy decreased stable bilateral lung nodules favor benign  Severe anemia from GI bleeding:Stopped after discontinuing anticoagulation CT CAP 02/08/2019: Stable pulmonary nodules no evidence of metastatic disease. Blood work was reviewed.  Encouraged her to drink more water.  Mild anemia: Anemia due to chronic disease.  scans 02/01/20 Return to clinic in 1 year for follow-up with labs   I discussed the assessment and treatment plan with the patient. The patient was provided an opportunity to ask questions and all were answered. The  patient agreed with the plan and demonstrated an understanding of the instructions. The patient was advised to call back or seek an in-person evaluation if the symptoms worsen or if the condition fails to improve as anticipated.   I provided 30 minutes of face-to-face MyChart video visit time during this encounter.    Rulon Eisenmenger, MD 01/17/2020   I, Molly Dorshimer, am acting as scribe for Nicholas Lose, MD.  I have reviewed the above  documentation for accuracy and completeness, and I agree with the above.

## 2020-01-17 ENCOUNTER — Inpatient Hospital Stay: Payer: Medicare Other | Attending: Hematology and Oncology | Admitting: Hematology and Oncology

## 2020-01-17 DIAGNOSIS — H9203 Otalgia, bilateral: Secondary | ICD-10-CM | POA: Insufficient documentation

## 2020-01-17 DIAGNOSIS — C210 Malignant neoplasm of anus, unspecified: Secondary | ICD-10-CM | POA: Diagnosis not present

## 2020-01-17 DIAGNOSIS — Z79899 Other long term (current) drug therapy: Secondary | ICD-10-CM | POA: Insufficient documentation

## 2020-01-17 DIAGNOSIS — R918 Other nonspecific abnormal finding of lung field: Secondary | ICD-10-CM | POA: Insufficient documentation

## 2020-01-17 DIAGNOSIS — R519 Headache, unspecified: Secondary | ICD-10-CM | POA: Diagnosis not present

## 2020-01-17 DIAGNOSIS — Z933 Colostomy status: Secondary | ICD-10-CM | POA: Insufficient documentation

## 2020-01-17 DIAGNOSIS — R590 Localized enlarged lymph nodes: Secondary | ICD-10-CM | POA: Diagnosis not present

## 2020-01-17 DIAGNOSIS — D638 Anemia in other chronic diseases classified elsewhere: Secondary | ICD-10-CM | POA: Diagnosis not present

## 2020-01-17 NOTE — Assessment & Plan Note (Signed)
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 It is unclear but the inguinal lymphadenopathy and the lung nodulescould be metastatic disease. it could be a stage IIIa versus stage IV.  Treatmentsummary 08/13/2016-09/25/2016: XRT to anus S/P diverting colostomy  CT CAP:12/30/2016: Nonspecific wall thickening posteriorly lower left rectum inguinal adenopathy decreased stable bilateral lung nodules favor benign  Severe anemia from GI bleeding:Stopped after discontinuing anticoagulation CT CAP 02/08/2019: Stable pulmonary nodules no evidence of metastatic disease. Blood work was reviewed.  Encouraged her to drink more water. Mild anemia: Anemia due to chronic disease.  Will stop CT scans in future. Return to clinic in 1 year for follow-up with labs

## 2020-01-18 ENCOUNTER — Ambulatory Visit (INDEPENDENT_AMBULATORY_CARE_PROVIDER_SITE_OTHER): Payer: Medicare Other | Admitting: *Deleted

## 2020-01-18 DIAGNOSIS — I495 Sick sinus syndrome: Secondary | ICD-10-CM

## 2020-01-18 LAB — CUP PACEART REMOTE DEVICE CHECK
Battery Remaining Longevity: 104 mo
Battery Remaining Percentage: 95.5 %
Battery Voltage: 2.99 V
Brady Statistic AP VP Percent: 1 %
Brady Statistic AP VS Percent: 93 %
Brady Statistic AS VP Percent: 1 %
Brady Statistic AS VS Percent: 6.4 %
Brady Statistic RA Percent Paced: 91 %
Brady Statistic RV Percent Paced: 1.2 %
Date Time Interrogation Session: 20210608020013
Implantable Lead Implant Date: 20190909
Implantable Lead Implant Date: 20190909
Implantable Lead Location: 753859
Implantable Lead Location: 753860
Implantable Lead Model: 3830
Implantable Pulse Generator Implant Date: 20190909
Lead Channel Impedance Value: 400 Ohm
Lead Channel Impedance Value: 460 Ohm
Lead Channel Pacing Threshold Amplitude: 0.75 V
Lead Channel Pacing Threshold Amplitude: 1.25 V
Lead Channel Pacing Threshold Pulse Width: 0.5 ms
Lead Channel Pacing Threshold Pulse Width: 1 ms
Lead Channel Sensing Intrinsic Amplitude: 1.4 mV
Lead Channel Sensing Intrinsic Amplitude: 1.5 mV
Lead Channel Setting Pacing Amplitude: 2.25 V
Lead Channel Setting Pacing Amplitude: 2.5 V
Lead Channel Setting Pacing Pulse Width: 1 ms
Lead Channel Setting Sensing Sensitivity: 0.7 mV
Pulse Gen Model: 2272
Pulse Gen Serial Number: 9061801

## 2020-01-19 NOTE — Progress Notes (Signed)
Remote pacemaker transmission.   

## 2020-02-01 ENCOUNTER — Ambulatory Visit (HOSPITAL_COMMUNITY)
Admission: RE | Admit: 2020-02-01 | Discharge: 2020-02-01 | Disposition: A | Discharge status: Home / Self Care | Payer: Medicare Other | Source: Ambulatory Visit | Attending: Hematology and Oncology | Admitting: Hematology and Oncology

## 2020-02-01 ENCOUNTER — Inpatient Hospital Stay: Payer: Medicare Other

## 2020-02-01 ENCOUNTER — Other Ambulatory Visit: Payer: Self-pay

## 2020-02-01 ENCOUNTER — Encounter (HOSPITAL_COMMUNITY): Payer: Self-pay

## 2020-02-01 DIAGNOSIS — C3490 Malignant neoplasm of unspecified part of unspecified bronchus or lung: Secondary | ICD-10-CM

## 2020-02-01 DIAGNOSIS — C210 Malignant neoplasm of anus, unspecified: Secondary | ICD-10-CM | POA: Diagnosis not present

## 2020-02-01 LAB — CMP (CANCER CENTER ONLY)
ALT: 42 U/L (ref 0–44)
AST: 37 U/L (ref 15–41)
Albumin: 4 g/dL (ref 3.5–5.0)
Alkaline Phosphatase: 98 U/L (ref 38–126)
Anion gap: 7 (ref 5–15)
BUN: 18 mg/dL (ref 8–23)
CO2: 32 mmol/L (ref 22–32)
Calcium: 9.1 mg/dL (ref 8.9–10.3)
Chloride: 104 mmol/L (ref 98–111)
Creatinine: 0.97 mg/dL (ref 0.44–1.00)
GFR, Est AFR Am: >60 mL/min (ref >60)
GFR, Estimated: 53 mL/min — ABNORMAL LOW (ref >60)
Glucose, Bld: 92 mg/dL (ref 70–99)
Potassium: 5.4 mmol/L — ABNORMAL HIGH (ref 3.5–5.1)
Sodium: 143 mmol/L (ref 135–145)
Total Bilirubin: 0.5 mg/dL (ref 0.3–1.2)
Total Protein: 6.8 g/dL (ref 6.5–8.1)

## 2020-02-01 LAB — CBC WITH DIFFERENTIAL (CANCER CENTER ONLY)
Abs Immature Granulocytes: 0.01 10*3/uL (ref 0.00–0.07)
Basophils Absolute: 0 10*3/uL (ref 0.0–0.1)
Basophils Relative: 0 %
Eosinophils Absolute: 0.4 10*3/uL (ref 0.0–0.5)
Eosinophils Relative: 6 %
HCT: 38.5 % (ref 36.0–46.0)
Hemoglobin: 12 g/dL (ref 12.0–15.0)
Immature Granulocytes: 0 %
Lymphocytes Relative: 22 %
Lymphs Abs: 1.4 10*3/uL (ref 0.7–4.0)
MCH: 27.2 pg (ref 26.0–34.0)
MCHC: 31.2 g/dL (ref 30.0–36.0)
MCV: 87.3 fL (ref 80.0–100.0)
Monocytes Absolute: 0.6 10*3/uL (ref 0.1–1.0)
Monocytes Relative: 9 %
Neutro Abs: 4 10*3/uL (ref 1.7–7.7)
Neutrophils Relative %: 63 %
Platelet Count: 137 10*3/uL — ABNORMAL LOW (ref 150–400)
RBC: 4.41 MIL/uL (ref 3.87–5.11)
RDW: 15.4 % (ref 11.5–15.5)
WBC Count: 6.3 10*3/uL (ref 4.0–10.5)
nRBC: 0 % (ref 0.0–0.2)

## 2020-02-01 MED ORDER — IOHEXOL 300 MG/ML  SOLN
100.0000 mL | Freq: Once | INTRAMUSCULAR | Status: AC | PRN
  Administered 2020-02-01: 100 mL via INTRAVENOUS

## 2020-02-01 MED ORDER — SODIUM CHLORIDE (PF) 0.9 % IJ SOLN
INTRAMUSCULAR | Status: AC
  Filled 2020-02-01: qty 50

## 2020-02-08 ENCOUNTER — Ambulatory Visit: Payer: Medicare Other | Admitting: Hematology and Oncology

## 2020-02-08 ENCOUNTER — Other Ambulatory Visit: Payer: Medicare Other

## 2020-03-13 DIAGNOSIS — M1711 Unilateral primary osteoarthritis, right knee: Secondary | ICD-10-CM | POA: Diagnosis not present

## 2020-03-18 ENCOUNTER — Other Ambulatory Visit: Payer: Self-pay | Admitting: Hematology and Oncology

## 2020-03-25 ENCOUNTER — Other Ambulatory Visit: Payer: Self-pay | Admitting: Internal Medicine

## 2020-03-27 ENCOUNTER — Telehealth: Payer: Self-pay | Admitting: Internal Medicine

## 2020-03-27 MED ORDER — DOFETILIDE 500 MCG PO CAPS: ORAL_CAPSULE | ORAL | 0 refills | Status: DC

## 2020-03-27 NOTE — Telephone Encounter (Signed)
Pt's medication was sent to pt's pharmacy as requested. Confirmation received.  °

## 2020-03-27 NOTE — Telephone Encounter (Signed)
*  STAT* If patient is at the pharmacy, call can be transferred to refill team.   1. Which medications need to be refilled? (please list name of each medication and dose if known)  dofetilide (TIKOSYN) 500 MCG capsule  2. Which pharmacy/location (including street and city if local pharmacy) is medication to be sent to?  [id number] - [place], [place] - [address] AT [address] [place]  3. Do they need a 30 day or 90 day supply? 50  Daughter said the pharmacy requires authorization before they refill this medication

## 2020-03-27 NOTE — Telephone Encounter (Signed)
Daughter states the patient also needs furosemide (LASIX) 20 MG tablet

## 2020-04-03 DIAGNOSIS — Z85828 Personal history of other malignant neoplasm of skin: Secondary | ICD-10-CM | POA: Diagnosis not present

## 2020-04-03 DIAGNOSIS — L570 Actinic keratosis: Secondary | ICD-10-CM | POA: Diagnosis not present

## 2020-04-03 DIAGNOSIS — Z08 Encounter for follow-up examination after completed treatment for malignant neoplasm: Secondary | ICD-10-CM | POA: Diagnosis not present

## 2020-04-08 ENCOUNTER — Other Ambulatory Visit: Payer: Self-pay | Admitting: Hematology and Oncology

## 2020-04-18 ENCOUNTER — Ambulatory Visit (INDEPENDENT_AMBULATORY_CARE_PROVIDER_SITE_OTHER): Payer: Medicare Other | Admitting: *Deleted

## 2020-04-18 DIAGNOSIS — I495 Sick sinus syndrome: Secondary | ICD-10-CM

## 2020-04-18 LAB — CUP PACEART REMOTE DEVICE CHECK
Battery Remaining Longevity: 106 mo
Battery Remaining Percentage: 95.5 %
Battery Voltage: 2.99 V
Brady Statistic AP VP Percent: 1 %
Brady Statistic AP VS Percent: 92 %
Brady Statistic AS VP Percent: 1 %
Brady Statistic AS VS Percent: 6.7 %
Brady Statistic RA Percent Paced: 91 %
Brady Statistic RV Percent Paced: 1.2 %
Date Time Interrogation Session: 20210907020012
Implantable Lead Implant Date: 20190909
Implantable Lead Implant Date: 20190909
Implantable Lead Location: 753859
Implantable Lead Location: 753860
Implantable Lead Model: 3830
Implantable Pulse Generator Implant Date: 20190909
Lead Channel Impedance Value: 410 Ohm
Lead Channel Impedance Value: 480 Ohm
Lead Channel Pacing Threshold Amplitude: 0.625 V
Lead Channel Pacing Threshold Amplitude: 0.75 V
Lead Channel Pacing Threshold Pulse Width: 0.5 ms
Lead Channel Pacing Threshold Pulse Width: 1 ms
Lead Channel Sensing Intrinsic Amplitude: 0.8 mV
Lead Channel Sensing Intrinsic Amplitude: 2 mV
Lead Channel Setting Pacing Amplitude: 1.625
Lead Channel Setting Pacing Amplitude: 2.5 V
Lead Channel Setting Pacing Pulse Width: 1 ms
Lead Channel Setting Sensing Sensitivity: 0.7 mV
Pulse Gen Model: 2272
Pulse Gen Serial Number: 9061801

## 2020-04-20 NOTE — Progress Notes (Signed)
Remote pacemaker transmission.   

## 2020-05-01 DIAGNOSIS — M1711 Unilateral primary osteoarthritis, right knee: Secondary | ICD-10-CM | POA: Diagnosis not present

## 2020-05-03 ENCOUNTER — Ambulatory Visit (INDEPENDENT_AMBULATORY_CARE_PROVIDER_SITE_OTHER): Payer: Medicare Other | Admitting: Internal Medicine

## 2020-05-03 ENCOUNTER — Encounter: Payer: Self-pay | Admitting: Internal Medicine

## 2020-05-03 ENCOUNTER — Other Ambulatory Visit: Payer: Self-pay

## 2020-05-03 VITALS — BP 132/78 | HR 61 | Ht 64.0 in | Wt 233.4 lb

## 2020-05-03 DIAGNOSIS — I4819 Other persistent atrial fibrillation: Secondary | ICD-10-CM | POA: Diagnosis not present

## 2020-05-03 DIAGNOSIS — I495 Sick sinus syndrome: Secondary | ICD-10-CM | POA: Diagnosis not present

## 2020-05-03 DIAGNOSIS — Z950 Presence of cardiac pacemaker: Secondary | ICD-10-CM

## 2020-05-03 MED ORDER — FUROSEMIDE 20 MG PO TABS: 20.0000 mg | ORAL_TABLET | ORAL | 3 refills | Status: DC | PRN

## 2020-05-03 NOTE — Progress Notes (Signed)
HPI Tricia Hernandez returns today for followup. She is a pleasant 86 yo obese woman with ah/o HTN, PAF, on dofetilide, and sinus node dysfunction s/p PPM insertion. She admits to dietary indiscretion. Back in early August she had some increased atrial fib. Better now.  Allergies  Allergen Reactions  . Penicillins Other (See Comments)    Reaction:  Unknown  Has patient had a PCN reaction causing immediate rash, facial/tongue/throat swelling, SOB or lightheadedness with hypotension: Unsure Has patient had a PCN reaction causing severe rash involving mucus membranes or skin necrosis: Unsure Has patient had a PCN reaction that required hospitalization Unsure Has patient had a PCN reaction occurring within the last 10 years: Unsure If all of the above answers are "NO", then may proceed with Cephalosporin use.   [name] [Naproxen Sodium] Hives  . Antihistamines, Chlorpheniramine-Type Rash  . Aspirin Rash  . Benadryl [Diphenhydramine Hcl] Palpitations and Other (See Comments)    Increases heart rate  . Codeine Rash  . Hydrochlorothiazide Rash  . Rocephin [Ceftriaxone Sodium In Dextrose] Rash  . Sulfa Antibiotics Rash     Current Outpatient Medications  Medication Sig Dispense Refill  . acetaminophen (TYLENOL) 325 MG tablet Take 650 mg by mouth daily as needed for moderate pain or headache.     . anti-nausea (EMETROL) solution Take 10 mLs by mouth every 15 (fifteen) minutes as needed for nausea or vomiting.    Marland Kitchen arformoterol (BROVANA) 15 MCG/2ML NEBU Take 2 mLs (15 mcg total) by nebulization 2 (two) times daily. 120 mL 5  . atorvastatin (LIPITOR) 20 MG tablet TAKE 1 TABLET (20 MG TOTAL) BY MOUTH AT BEDTIME. NEEDS TO ESTABLISH WITH NEW PROVIDER. 90 tablet 3  . Biotin 10000 MCG TABS Take 10,000 mcg by mouth at bedtime.     . budesonide (PULMICORT) 0.5 MG/2ML nebulizer solution USE 1 VIAL VIA NEBULIZER TWICE DAILY 360 mL 5  . Calcium 600-200 MG-UNIT tablet Take 1 tablet by mouth at  bedtime.     . dofetilide (TIKOSYN) 500 MCG capsule TAKE 1 CAPSULE(500 MCG) BY MOUTH TWICE DAILY. Please keep upcoming appt in September with Dr. Lovena Le before anymore refills. Thank you 180 capsule 0  . ferrous sulfate 325 (65 FE) MG EC tablet Take 325 mg by mouth 2 (two) times daily.    . fluticasone (FLONASE) 50 MCG/ACT nasal spray Place 1 spray into both nostrils daily.    . furosemide (LASIX) 20 MG tablet Take 1 tablet (20 mg total) by mouth as needed. 90 tablet 3  . ipratropium-albuterol (DUONEB) 0.5-2.5 (3) MG/3ML SOLN Take 3 mLs by nebulization 2 (two) times daily. 540 mL 0  . levocetirizine (XYZAL) 5 MG tablet Take 5 mg by mouth at bedtime.  6  . magnesium oxide (MAG-OX) 400 MG tablet Take by mouth.    [employer] KIT 8514 is number on bag one piece unit. Colostomy bag and adhesive and alcohol prep, no sting, cannot use regular prep pad. 30 each 11  . pantoprazole (PROTONIX) 40 MG tablet TAKE 1 TABLET(40 MG) BY MOUTH DAILY 90 tablet 0  . PARoxetine (PAXIL) 40 MG tablet Take 0.5 tablets (20 mg total) by mouth 2 (two) times daily. 90 tablet 3  . vitamin B-12 (CYANOCOBALAMIN) 1000 MCG tablet Take 1,000 mcg by mouth at bedtime.     . Vitamin D, Ergocalciferol, (DRISDOL) 1.25 MG (50000 UNIT) CAPS capsule TAKE 1 CAPSULE BY MOUTH 1 TIME A WEEK 12 capsule 0   No current  facility-administered medications for this visit.   Facility-Administered Medications Ordered in Other Visits  Medication Dose Route Frequency Provider Last Rate Last Admin  . sodium phosphate (FLEET) 7-19 GM/118ML enema 2 enema  2 enema Rectal Once Ileana Roup, MD         Past Medical History:  Diagnosis Date  . 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 Anemia   . Anxiety   . Atrial fibrillation ([place])   . Basal cell carcinoma of skin of nose   . CHF (congestive heart failure) ([place]) 11/2015  . Chronic bronchitis ([place])   . Chronic depression   . Colostomy in  place [facility]) 07/2016  . Diverticular disease   . Fibrocystic disease of breast   . Fracture of shoulder   . GERD (gastroesophageal reflux disease)   . GI bleed 12/10/2016   in setting of no PPI. 5/2 EGD: grade A reflux esophagitis.  Mild, non-hemorrhagic gastritis.  Ablation of 3 Duodenal AVMs  . Herpes zoster   . History of blood transfusion    for vaginal, uterine bleeding leading to hysterectomy.  in 12/2016 transfused x 1 for GI bleed.   Marland Kitchen History of hiatal hernia   . Hyperlipidemia   . Hypertension   . Labyrinthitis   . Lung cancer ([place])    RML  . Obesity   . On home oxygen therapy    "3L just at night when I sleep" (04/20/2018)  . Paroxysmal supraventricular tachycardia ([place])   . Peptic ulcer of stomach    hx  . Pneumonia 1980s X 1; 08/2016   "walking; double"  . Presence of permanent cardiac pacemaker 04/20/2018  . Pulmonary embolism ([place]) 05/2014   "left lung"  . Seasonal allergies    "bad" (04/20/2018)  . Vitamin D deficiency     ROS:   All systems reviewed and negative except as noted in the HPI.   Past Surgical History:  Procedure Laterality Date  . APPENDECTOMY  1959  . BASAL CELL CARCINOMA EXCISION     nose x 2  . BREAST CYST ASPIRATION Right 2016?  Marland Kitchen CATARACT EXTRACTION W/ INTRAOCULAR LENS  IMPLANT, BILATERAL Bilateral 1990s  . CHOLECYSTECTOMY OPEN  1980s  . COLON SURGERY    . COLOSTOMY N/A 07/29/2016   Procedure: COLOSTOMY;  Surgeon: Clovis Riley, MD;  Location: [place];  Service: General;  Laterality: N/A;  . DILATION AND CURETTAGE OF UTERUS    . ESOPHAGOGASTRODUODENOSCOPY N/A 12/11/2016   Procedure: ESOPHAGOGASTRODUODENOSCOPY (EGD);  Surgeon: Jerene Bears, MD;  Location[facility] ENDOSCOPY;  Service: Endoscopy;  Laterality: N/A;  . EVALUATION UNDER ANESTHESIA WITH HEMORRHOIDECTOMY AND PROCTOSCOPY N/A 07/25/2016   Procedure: EXAM UNDER ANESTHESIA, EXCISION PERIANAL MASS.;  Surgeon: Judeth Horn, MD;  Location: [place];  Service: General;  Laterality: N/A;  Prone  position  . FLEXIBLE SIGMOIDOSCOPY N/A 06/16/2017   Procedure: FLEXIBLE SIGMOIDOSCOPY EXAM UNDER ANESHESIA;  Surgeon: Ileana Roup, MD;  Location: [facility];  Service: General;  Laterality: N/A;  . INSERT / REPLACE / REMOVE PACEMAKER  04/20/2018  . IR RADIOLOGIST EVAL & MGMT  03/25/2017  . LAPAROSCOPIC DIVERTED COLOSTOMY N/A 07/29/2016   Procedure: ATTEMPTED LAPAROSCOPIC ASSISTED COLOSTOMY;  Surgeon: Clovis Riley, MD;  Location: [place];  Service: General;  Laterality: N/A;  . LUNG REMOVAL, PARTIAL Right 2013   RML mass/carcinoid, Dr Lurena Nida  . PACEMAKER IMPLANT N/A 04/20/2018   Procedure: PACEMAKER IMPLANT;  Surgeon: Evans Lance,  MD;  Location: [place] [facility];  Service: Cardiovascular;  Laterality: N/A;  . SQUAMOUS CELL CARCINOMA EXCISION     rectum, right leg  . TONSILLECTOMY AND ADENOIDECTOMY  1960s  . VAGINAL HYSTERECTOMY  1959   "partial"     Family History  Problem Relation Age of Onset  . Heart attack Mother        Dec 1987  . CVA Mother   . Hypertension Mother   . Multiple myeloma Mother   . Breast cancer Sister   . Skin cancer Sister   . Prostate cancer Brother   . Throat cancer Brother   . Cervical cancer Daughter   . Leukemia Daughter   . Leukemia Son   . Throat cancer Brother   . Cancer Brother        soft palette  . Heart Problems Brother        Pacemaker  . Colon cancer Neg Hx   . Pancreatic cancer Neg Hx      Social History   Socioeconomic History  . Marital status: Widowed    Spouse name: Not on file  . Number of children: 5  . Years of education: Not on file  . Highest education level: Not on file  Occupational History  . Occupation: retired  Tobacco Use  . Smoking status: Former Smoker    Packs/day: 0.00    Types: Cigarettes  . Smokeless tobacco: Never Used  . Tobacco comment: 04/20/2018 "smoked once in awhile in the 1960s; not more that 4 packs total in my whole life"  Vaping Use  . Vaping Use: Never used  Substance and  Sexual Activity  . Alcohol use: Never  . Drug use: Never  . Sexual activity: Not Currently  Other Topics Concern  . Not on file  Social History Narrative   Patient is widowed and retired she has 5 children, she lives alone and performs all of her ADLs   Former smoker, never alcohol never drug use   Social Determinants of Radio broadcast assistant Strain:   . Difficulty of Paying Living Expenses: Not on file  Food Insecurity:   . Worried About Charity fundraiser in the Last Year: Not on file  . Ran Out of Food in the Last Year: Not on file  Transportation Needs:   . Lack of Transportation (Medical): Not on file  . Lack of Transportation (Non-Medical): Not on file  Physical Activity:   . Days of Exercise per Week: Not on file  . Minutes of Exercise per Session: Not on file  Stress:   . Feeling of Stress : Not on file  Social Connections:   . Frequency of Communication with Friends and Family: Not on file  . Frequency of Social Gatherings with Friends and Family: Not on file  . Attends Religious Services: Not on file  . Active Member of Clubs or Organizations: Not on file  . Attends Archivist Meetings: Not on file  . Marital Status: Not on file  Intimate Partner Violence:   . Fear of Current or Ex-Partner: Not on file  . Emotionally Abused: Not on file  . Physically Abused: Not on file  . Sexually Abused: Not on file     BP 132/78   Pulse 61   Ht _0  (1.626 m)   Wt 233 lb 6.4 oz (105.9 kg)   SpO2 95%   BMI 40.06 kg/m   Physical Exam:  Overweight but otherwise well appearing  elderly woman, NAD HEENT: Unremarkable Neck:  No JVD, no thyromegally Lymphatics:  No adenopathy Back:  No CVA tenderness Lungs:  Clear, with no wheezes, rales, or rhonchi HEART:  Regular rate rhythm, no murmurs, no rubs, no clicks Abd:  soft, positive bowel sounds, no organomegally, no rebound, no guarding Ext:  2 plus pulses, no edema, no cyanosis, no clubbing Skin:  No  rashes no nodules Neuro:  CN II through XII intact, motor grossly intact  EKG normal sinus rhythm with atrial pacing  DEVICE  Normal device function.  See PaceArt for details.   Assess/Plan: 1.  Paroxysmal atrial fibrillation -she is maintaining sinus rhythm over 99% of the time.  She will continue her current medications. 2.  Sinus node dysfunction -she is asymptomatic status post pacemaker insertion. 3.  Obesity -she has been unable to lose weight.  I have encouraged her to try to do so. 4.  Pacemaker -her [place] dual-chamber pacemaker is working normally.  No change in medications.  Cristopher Peru, MD

## 2020-05-03 NOTE — Patient Instructions (Signed)
Medication Instructions:  Your physician recommends that you continue on your current medications as directed. Please refer to the Current Medication list given to you today.  Labwork: None ordered.  Testing/Procedures: None ordered.  Follow-Up: Your physician wants you to follow-up in: one year with Dr. Lovena Le.   You will receive a reminder letter in the mail two months in advance. If you don't receive a letter, please call our office to schedule the follow-up appointment.  Remote monitoring is used to monitor your Pacemaker from home. This monitoring reduces the number of office visits required to check your device to one time per year. It allows [place] to keep an eye on the functioning of your device to ensure it is working properly. You are scheduled for a device check from home on 07/18/2020. You may send your transmission at any time that day. If you have a wireless device, the transmission will be sent automatically. After your physician reviews your transmission, you will receive a postcard with your next transmission date.  Any Other Special Instructions Will Be Listed Below (If Applicable).  If you need a refill on your cardiac medications before your next appointment, please call your pharmacy.

## 2020-05-08 DIAGNOSIS — Z23 Encounter for immunization: Secondary | ICD-10-CM | POA: Diagnosis not present

## 2020-05-08 LAB — CUP PACEART INCLINIC DEVICE CHECK
Battery Remaining Longevity: 118 mo
Battery Voltage: 2.99 V
Brady Statistic RA Percent Paced: 90 %
Brady Statistic RV Percent Paced: 1.1 %
Date Time Interrogation Session: 20210922165610
Implantable Lead Implant Date: 20190909
Implantable Lead Implant Date: 20190909
Implantable Lead Location: 753859
Implantable Lead Location: 753860
Implantable Lead Model: 3830
Implantable Pulse Generator Implant Date: 20190909
Lead Channel Impedance Value: 412.5 Ohm
Lead Channel Impedance Value: 462.5 Ohm
Lead Channel Pacing Threshold Amplitude: 0.625 V
Lead Channel Pacing Threshold Amplitude: 0.75 V
Lead Channel Pacing Threshold Amplitude: 0.75 V
Lead Channel Pacing Threshold Pulse Width: 0.5 ms
Lead Channel Pacing Threshold Pulse Width: 1 ms
Lead Channel Pacing Threshold Pulse Width: 1 ms
Lead Channel Sensing Intrinsic Amplitude: 1.9 mV
Lead Channel Sensing Intrinsic Amplitude: 2.8 mV
Lead Channel Setting Pacing Amplitude: 1.625
Lead Channel Setting Pacing Amplitude: 2.5 V
Lead Channel Setting Pacing Pulse Width: 1 ms
Lead Channel Setting Sensing Sensitivity: 0.7 mV
Pulse Gen Model: 2272
Pulse Gen Serial Number: 9061801

## 2020-06-12 ENCOUNTER — Other Ambulatory Visit: Payer: Self-pay | Admitting: Internal Medicine

## 2020-06-15 DIAGNOSIS — Z23 Encounter for immunization: Secondary | ICD-10-CM | POA: Diagnosis not present

## 2020-06-26 ENCOUNTER — Other Ambulatory Visit: Payer: Self-pay | Admitting: Family

## 2020-06-26 DIAGNOSIS — E782 Mixed hyperlipidemia: Secondary | ICD-10-CM

## 2020-07-01 ENCOUNTER — Other Ambulatory Visit: Payer: Self-pay | Admitting: Hematology and Oncology

## 2020-07-01 ENCOUNTER — Other Ambulatory Visit: Payer: Self-pay | Admitting: Internal Medicine

## 2020-07-17 DIAGNOSIS — H353111 Nonexudative age-related macular degeneration, right eye, early dry stage: Secondary | ICD-10-CM | POA: Diagnosis not present

## 2020-07-18 ENCOUNTER — Ambulatory Visit (INDEPENDENT_AMBULATORY_CARE_PROVIDER_SITE_OTHER): Payer: Medicare Other

## 2020-07-18 ENCOUNTER — Other Ambulatory Visit: Payer: Self-pay | Admitting: Internal Medicine

## 2020-07-18 DIAGNOSIS — I495 Sick sinus syndrome: Secondary | ICD-10-CM | POA: Diagnosis not present

## 2020-07-18 LAB — CUP PACEART REMOTE DEVICE CHECK
Battery Remaining Longevity: 107 mo
Battery Remaining Percentage: 95.5 %
Battery Voltage: 2.99 V
Brady Statistic AP VP Percent: 1 %
Brady Statistic AP VS Percent: 90 %
Brady Statistic AS VP Percent: 1 %
Brady Statistic AS VS Percent: 9.1 %
Brady Statistic RA Percent Paced: 88 %
Brady Statistic RV Percent Paced: 1.3 %
Date Time Interrogation Session: 20211207025649
Implantable Lead Implant Date: 20190909
Implantable Lead Implant Date: 20190909
Implantable Lead Location: 753859
Implantable Lead Location: 753860
Implantable Lead Model: 3830
Implantable Pulse Generator Implant Date: 20190909
Lead Channel Impedance Value: 390 Ohm
Lead Channel Impedance Value: 460 Ohm
Lead Channel Pacing Threshold Amplitude: 0.625 V
Lead Channel Pacing Threshold Amplitude: 0.75 V
Lead Channel Pacing Threshold Pulse Width: 0.5 ms
Lead Channel Pacing Threshold Pulse Width: 1 ms
Lead Channel Sensing Intrinsic Amplitude: 1.8 mV
Lead Channel Sensing Intrinsic Amplitude: 2.2 mV
Lead Channel Setting Pacing Amplitude: 1.625
Lead Channel Setting Pacing Amplitude: 2.5 V
Lead Channel Setting Pacing Pulse Width: 1 ms
Lead Channel Setting Sensing Sensitivity: 0.7 mV
Pulse Gen Model: 2272
Pulse Gen Serial Number: 9061801

## 2020-07-24 ENCOUNTER — Other Ambulatory Visit: Payer: Self-pay

## 2020-07-24 MED ORDER — MAGNESIUM OXIDE 400 MG PO TABS: 400.0000 mg | ORAL_TABLET | Freq: Every day | ORAL | 3 refills | Status: DC

## 2020-07-24 NOTE — Telephone Encounter (Signed)
Pt's daughter requesting a refill on magnesium oxide. This medication was refilled with PCP. Would Dr. Lovena Le like to refill this medication? Please addres

## 2020-07-24 NOTE — Telephone Encounter (Signed)
Mag ox refilled.

## 2020-07-28 NOTE — Progress Notes (Signed)
Remote pacemaker transmission.   

## 2020-09-18 ENCOUNTER — Other Ambulatory Visit: Payer: Self-pay | Admitting: Hematology and Oncology

## 2020-09-29 ENCOUNTER — Other Ambulatory Visit: Payer: Self-pay

## 2020-10-02 ENCOUNTER — Other Ambulatory Visit: Payer: Self-pay | Admitting: Pulmonary Disease

## 2020-10-02 ENCOUNTER — Ambulatory Visit: Payer: Medicare Other | Admitting: Family

## 2020-10-02 ENCOUNTER — Telehealth: Payer: Self-pay | Admitting: Family

## 2020-10-02 DIAGNOSIS — E782 Mixed hyperlipidemia: Secondary | ICD-10-CM

## 2020-10-02 NOTE — Telephone Encounter (Signed)
Labs are in place; she can get them downstairs prior to her appointment with me; I can't get STAT labs done here and there is really no reason to so we won't have the labs at the time to review at time of OV. I think it will be fine though.

## 2020-10-02 NOTE — Telephone Encounter (Signed)
Patients family wondering if she can go early the morning of her appointment on 03.22.22 to get her lab work done so she doesn't have to wait until after her appointment to eat

## 2020-10-02 NOTE — Telephone Encounter (Signed)
Appt for 10/31/20.

## 2020-10-02 NOTE — Addendum Note (Signed)
Addended by: Sherlene Shams on: 10/02/2020 04:52 PM   Modules accepted: Orders

## 2020-10-05 NOTE — Telephone Encounter (Signed)
Pt daughter notified that lab order is in place for pt to have labs drawn at the [facility] location prior to her OV with PCP.  Collie Siad verb understanding & has no ques/concerns at this time.

## 2020-10-17 ENCOUNTER — Ambulatory Visit (INDEPENDENT_AMBULATORY_CARE_PROVIDER_SITE_OTHER): Payer: Medicare Other

## 2020-10-17 DIAGNOSIS — I495 Sick sinus syndrome: Secondary | ICD-10-CM

## 2020-10-17 LAB — CUP PACEART REMOTE DEVICE CHECK
Battery Remaining Longevity: 107 mo
Battery Remaining Percentage: 95.5 %
Battery Voltage: 2.99 V
Brady Statistic AP VP Percent: 1 %
Brady Statistic AP VS Percent: 88 %
Brady Statistic AS VP Percent: 1 %
Brady Statistic AS VS Percent: 11 %
Brady Statistic RA Percent Paced: 84 %
Brady Statistic RV Percent Paced: 1.5 %
Date Time Interrogation Session: 20220308020015
Implantable Lead Implant Date: 20190909
Implantable Lead Implant Date: 20190909
Implantable Lead Location: 753859
Implantable Lead Location: 753860
Implantable Lead Model: 3830
Implantable Pulse Generator Implant Date: 20190909
Lead Channel Impedance Value: 390 Ohm
Lead Channel Impedance Value: 450 Ohm
Lead Channel Pacing Threshold Amplitude: 0.625 V
Lead Channel Pacing Threshold Amplitude: 0.75 V
Lead Channel Pacing Threshold Pulse Width: 0.5 ms
Lead Channel Pacing Threshold Pulse Width: 1 ms
Lead Channel Sensing Intrinsic Amplitude: 2.6 mV
Lead Channel Sensing Intrinsic Amplitude: 5.1 mV
Lead Channel Setting Pacing Amplitude: 1.625
Lead Channel Setting Pacing Amplitude: 2.5 V
Lead Channel Setting Pacing Pulse Width: 1 ms
Lead Channel Setting Sensing Sensitivity: 0.7 mV
Pulse Gen Model: 2272
Pulse Gen Serial Number: 9061801

## 2020-10-25 NOTE — Progress Notes (Signed)
Remote pacemaker transmission.   

## 2020-10-31 ENCOUNTER — Other Ambulatory Visit (INDEPENDENT_AMBULATORY_CARE_PROVIDER_SITE_OTHER): Payer: Medicare Other

## 2020-10-31 ENCOUNTER — Ambulatory Visit (INDEPENDENT_AMBULATORY_CARE_PROVIDER_SITE_OTHER): Payer: Medicare Other | Admitting: Family

## 2020-10-31 ENCOUNTER — Encounter: Payer: Self-pay | Admitting: Family

## 2020-10-31 ENCOUNTER — Other Ambulatory Visit: Payer: Self-pay

## 2020-10-31 ENCOUNTER — Encounter: Payer: Self-pay | Admitting: Pulmonary Disease

## 2020-10-31 ENCOUNTER — Ambulatory Visit (INDEPENDENT_AMBULATORY_CARE_PROVIDER_SITE_OTHER): Payer: Medicare Other | Admitting: Pulmonary Disease

## 2020-10-31 VITALS — BP 139/87 | HR 72 | Temp 97.2°F | Ht 64.0 in | Wt 238.4 lb

## 2020-10-31 VITALS — BP 146/70 | HR 68 | Temp 98.9°F | Ht 64.0 in | Wt 236.6 lb

## 2020-10-31 DIAGNOSIS — R202 Paresthesia of skin: Secondary | ICD-10-CM

## 2020-10-31 DIAGNOSIS — F4323 Adjustment disorder with mixed anxiety and depressed mood: Secondary | ICD-10-CM | POA: Diagnosis not present

## 2020-10-31 DIAGNOSIS — J449 Chronic obstructive pulmonary disease, unspecified: Secondary | ICD-10-CM

## 2020-10-31 DIAGNOSIS — R200 Anesthesia of skin: Secondary | ICD-10-CM | POA: Diagnosis not present

## 2020-10-31 DIAGNOSIS — E782 Mixed hyperlipidemia: Secondary | ICD-10-CM | POA: Diagnosis not present

## 2020-10-31 DIAGNOSIS — R918 Other nonspecific abnormal finding of lung field: Secondary | ICD-10-CM | POA: Diagnosis not present

## 2020-10-31 DIAGNOSIS — R252 Cramp and spasm: Secondary | ICD-10-CM | POA: Diagnosis not present

## 2020-10-31 LAB — CBC WITH DIFFERENTIAL/PLATELET
Basophils Absolute: 0 10*3/uL (ref 0.0–0.1)
Basophils Relative: 0.4 % (ref 0.0–3.0)
Eosinophils Absolute: 0.1 10*3/uL (ref 0.0–0.7)
Eosinophils Relative: 1.2 % (ref 0.0–5.0)
HCT: 38.2 % (ref 36.0–46.0)
Hemoglobin: 12.4 g/dL (ref 12.0–15.0)
Lymphocytes Relative: 18.1 % (ref 12.0–46.0)
Lymphs Abs: 1 10*3/uL (ref 0.7–4.0)
MCHC: 32.5 g/dL (ref 30.0–36.0)
MCV: 85.2 fl (ref 78.0–100.0)
Monocytes Absolute: 0.3 10*3/uL (ref 0.1–1.0)
Monocytes Relative: 6.1 % (ref 3.0–12.0)
Neutro Abs: 4.1 10*3/uL (ref 1.4–7.7)
Neutrophils Relative %: 74.2 % (ref 43.0–77.0)
Platelets: 118 10*3/uL — ABNORMAL LOW (ref 150.0–400.0)
RBC: 4.48 Mil/uL (ref 3.87–5.11)
RDW: 16.6 % — ABNORMAL HIGH (ref 11.5–15.5)
WBC: 5.6 10*3/uL (ref 4.0–10.5)

## 2020-10-31 LAB — COMPREHENSIVE METABOLIC PANEL
ALT: 32 U/L (ref 0–35)
AST: 29 U/L (ref 0–37)
Albumin: 4 g/dL (ref 3.5–5.2)
Alkaline Phosphatase: 82 U/L (ref 39–117)
BUN: 20 mg/dL (ref 6–23)
CO2: 37 mEq/L — ABNORMAL HIGH (ref 19–32)
Calcium: 9.3 mg/dL (ref 8.4–10.5)
Chloride: 102 mEq/L (ref 96–112)
Creatinine, Ser: 0.92 mg/dL (ref 0.40–1.20)
GFR: 56.07 mL/min — ABNORMAL LOW (ref >60.00)
Glucose, Bld: 90 mg/dL (ref 70–99)
Potassium: 4.7 mEq/L (ref 3.5–5.1)
Sodium: 144 mEq/L (ref 135–145)
Total Bilirubin: 0.5 mg/dL (ref 0.2–1.2)
Total Protein: 6.3 g/dL (ref 6.0–8.3)

## 2020-10-31 LAB — LIPID PANEL
Cholesterol: 178 mg/dL (ref 0–200)
HDL: 64.9 mg/dL (ref >39.00)
LDL Cholesterol: 86 mg/dL (ref 0–99)
NonHDL: 113.39
Total CHOL/HDL Ratio: 3
Triglycerides: 137 mg/dL (ref 0.0–149.0)
VLDL: 27.4 mg/dL (ref 0.0–40.0)

## 2020-10-31 LAB — MAGNESIUM: Magnesium: 2.2 mg/dL (ref 1.5–2.5)

## 2020-10-31 LAB — VITAMIN B12: Vitamin B-12: >1506 pg/mL — ABNORMAL HIGH (ref 211–911)

## 2020-10-31 MED ORDER — PAROXETINE HCL 40 MG PO TABS: 20.0000 mg | ORAL_TABLET | Freq: Two times a day (BID) | ORAL | 3 refills | Status: DC

## 2020-10-31 MED ORDER — YUPELRI 175 MCG/3ML IN SOLN: 175.0000 ug | Freq: Every day | RESPIRATORY_TRACT | 6 refills | Status: DC

## 2020-10-31 NOTE — Progress Notes (Signed)
       Tricia Hernandez    [id number]    11/28/1930  Primary Care Physician:Murray, Laura Woodruff, FNP  Referring Physician: Murray, Laura Woodruff, FNP [address] [place],  [place] [address]  Chief complaint:   Follow up of  COPD Lung nodules. 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 She used to follow Dr. O Neal, Pul[facility] [place].   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 Work up included CT scan of the chest and PET scan which shows non avid stable lung nodules. Has H/O uncontrolled atrial fibrillation and loaded with Tikosyn. Also has sick sinus rhythm s/p PPM insertion in 2019.    Her anticoagulation was held in 2019 due GI bleed after discussion with Dr. Gessner, GI and Dr. Gudena, Oncology. She has been evaluated by IR for IVC filter. This has been deferred as she did not have any DVT or evidence of hypercoagulability.  She has a remote smoking history of about 5 pack years and exposure to secondhand smoke. Quit smoking in 1975. 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   Interim history: She is doing well with no issues  Continues on Pulmicort, Brovana, duo nebs and albuterol nebs as needed. Surveillance CT scan earlier this year shows stable lung nodules  Outpatient Encounter Medications as of 10/31/2020  Medication Sig  . acetaminophen (TYLENOL) 325 MG tablet Take 650 mg by mouth daily as needed for moderate pain or headache.   . anti-nausea (EMETROL) solution Take 10 mLs by mouth every 15 (fifteen) minutes as needed for nausea or vomiting.  . arformoterol  (BROVANA) 15 MCG/2ML NEBU INHALE CONTENTS OF 1 VIAL(2MLS) VIA NEBULIZER TWICE DAILY  . atorvastatin (LIPITOR) 20 MG tablet TAKE 1 TABLET(20 MG) BY MOUTH AT BEDTIME  . Biotin 10000 MCG TABS Take 10,000 mcg by mouth at bedtime.   . budesonide (PULMICORT) 0.5 MG/2ML nebulizer solution USE 1 VIAL VIA NEBULIZER TWICE DAILY  . Calcium 600-200 MG-UNIT tablet Take 1 tablet by mouth at bedtime.   . dofetilide (TIKOSYN) 500 MCG capsule TAKE ONE CAPSULE BY MOUTH TWICE DAILY  . ferrous sulfate 325 (65 FE) MG EC tablet Take 325 mg by mouth 2 (two) times daily.  . fluticasone (FLONASE) 50 MCG/ACT nasal spray Place 1 spray into both nostrils daily.  . furosemide (LASIX) 20 MG tablet Take 1 tablet (20 mg total) by mouth as needed.  . ipratropium-albuterol (DUONEB) 0.5-2.5 (3) MG/3ML SOLN Take 3 mLs by nebulization 2 (two) times daily.  . levocetirizine (XYZAL) 5 MG tablet Take 5 mg by mouth at bedtime.  . magnesium oxide (MAG-OX) 400 MG tablet Take 1 tablet (400 mg total) by mouth daily.  . Ostomy Supplies KIT 8514 is number on bag one piece unit. Colostomy bag and adhesive and alcohol prep, no sting, cannot use regular prep pad.  . pantoprazole (PROTONIX) 40 MG tablet TAKE 1 TABLET(40 MG) BY MOUTH DAILY  . PARoxetine (PAXIL) 40 MG tablet Take 0.5 tablets (20 mg total) by mouth 2 (two) times daily.  . vitamin B-12 (CYANOCOBALAMIN) 1000 MCG tablet Take   1,000 mcg by mouth at bedtime.   . Vitamin D, Ergocalciferol, (DRISDOL) 1.25 MG (50000 UNIT) CAPS capsule TAKE 1 CAPSULE BY MOUTH 1 TIME A WEEK   Facility-Administered Encounter Medications as of 10/31/2020  Medication  . sodium phosphate (FLEET) 7-19 GM/118ML enema 2 enema   Physical Exam: Blood pressure 139/87, pulse 72, temperature (!) 97.2 F (36.2 C), temperature source Temporal, height 5' 4" (1.626 m), weight 238 lb 6.4 oz (108.1 kg), SpO2 95 %. Gen:      No acute distress HEENT:  EOMI, sclera anicteric Neck:     No masses; no thyromegaly Lungs:     Clear to auscultation bilaterally; normal respiratory effort CV:         Regular rate and rhythm; no murmurs Abd:      + bowel sounds; soft, non-tender; no palpable masses, no distension Ext:    No edema; adequate peripheral perfusion Skin:      Warm and dry; no rash Neuro: alert and oriented x 3 Psych: normal mood and affect  Data Reviewed: Imaging: CT chest 07/25/16-postsurgical changes in right middle lung, multiple subcentimeter pulmonary nodules. CTA chest 08/27/16-no pulmonary embolus, stable postsurgical changes, multiple subcentimeter pulmonary nodules. Consolidative changes in bilateral lungs, small bilateral pleural effusions. PET scan 09/16/16- resolution of consolidative changes, stable postsurgical changes, stable subcentimeter pulmonary nodules which are non-PET avid CT chest abdomen pelvis 12/30/2017- no evidence of recurrent metastatic disease in the abdomen or pelvis.  New pulmonary nodules in the right lower lobe.  Previously visualized pulmonary nodules are stable. CT chest abdomen pelvis 02/08/2019- no evidence of recurrent cancer.  Lung nodules are stable. CT chest abdomen pelvis 02/01/2020- no evidence of recurrent cancer.  Lung nodules are stable. I have reviewed all the images personally  PFTs 02/25/17 FVC 1.43 [58%), FEV1 0.82 (45%), F/F 57, TLC 293%, RV/TLC 188% Severe obstruction with bronchodilator response, hyperinflation  Labs: Alpha-1 antitrypsin 05/27/2017-141, PI MM  Assessment:  Severe COPD PFTs show severe obstruction with hyperinflation and bronchodilator response (as shown by improvement in FVC].  Stable on Brovana, Pulmicort.  Add Yupelri, change duo nebs to as needed Continue supplemental oxygen during the nighttime.  Pulmonary nodules H/O lung carcinoid s/p resection Follow-up CT reviewed with stable lung nodules.  Plan/Recommendations: - Continue brovana, pulmicort, add Yupelri,  - Duonebs as needed    MD [facility] 10/31/2020, 10:12 AM  CC: Murray, Laura Woodruff,*   

## 2020-10-31 NOTE — Addendum Note (Signed)
Addended by: Merrilee Seashore on: 10/31/2020 12:39 PM   Modules accepted: Orders

## 2020-10-31 NOTE — Progress Notes (Signed)
Tricia Hernandez is a 87 y.o. female with the following history as recorded in EpicCare:  Patient Active Problem List   Diagnosis Date Noted  . COPD with chronic bronchitis and emphysema ([place]) 03/23/2019  . Anxiety and depression 11/02/2018  . Allergic rhinitis 11/02/2018  . Pacemaker 04/20/2018  . Sinus node dysfunction ([place]) 04/14/2018  . Vitamin D deficiency 05/27/2017  . Venous stasis dermatitis of both lower extremities 02/23/2017  . Anemia 02/22/2017  . Acid reflux 02/22/2017  . Colostomy care ([place]) 12/20/2016  . Angiodysplasia of duodenum   . GI bleeding 12/10/2016  . Mixed hyperlipidemia 11/05/2016  . Chronic bronchitis ([place]) 11/04/2016  . Persistent atrial fibrillation ([place]) 10/07/2016  . Acute respiratory failure with hypoxia ([place]) 08/26/2016  . Essential hypertension   . Pulmonary nodules   . Palliative care encounter   . Anal cancer ([place]) 07/22/2016  . Atrial fibrillation ([place]), CHA2DS2-VASc Score 5 07/22/2016  . Pulmonary embolus ([place])   . Obesity   . Lung cancer [facility])     Current Outpatient Medications  Medication Sig Dispense Refill  . acetaminophen (TYLENOL) 325 MG tablet Take 650 mg by mouth daily as needed for moderate pain or headache.     . anti-nausea (EMETROL) solution Take 10 mLs by mouth every 15 (fifteen) minutes as needed for nausea or vomiting.    Marland Kitchen arformoterol (BROVANA) 15 MCG/2ML NEBU INHALE CONTENTS OF 1 VIAL(2MLS) VIA NEBULIZER TWICE DAILY 120 mL 5  . atorvastatin (LIPITOR) 20 MG tablet TAKE 1 TABLET(20 MG) BY MOUTH AT BEDTIME 90 tablet 3  . Biotin 10000 MCG TABS Take 10,000 mcg by mouth at bedtime.     . budesonide (PULMICORT) 0.5 MG/2ML nebulizer solution USE 1 VIAL VIA NEBULIZER TWICE DAILY 360 mL 5  . Calcium 600-200 MG-UNIT tablet Take 1 tablet by mouth at bedtime.     . dofetilide (TIKOSYN) 500 MCG capsule TAKE ONE CAPSULE BY MOUTH TWICE DAILY 180 capsule 3  . ferrous sulfate 325 (65 FE) MG EC tablet Take 325 mg by mouth 2 (two) times  daily.    . fluticasone (FLONASE) 50 MCG/ACT nasal spray Place 1 spray into both nostrils daily.    . furosemide (LASIX) 20 MG tablet Take 1 tablet (20 mg total) by mouth as needed. 90 tablet 3  . ipratropium-albuterol (DUONEB) 0.5-2.5 (3) MG/3ML SOLN Take 3 mLs by nebulization 2 (two) times daily. (Patient taking differently: Take 3 mLs by nebulization as needed.) 540 mL 0  . levocetirizine (XYZAL) 5 MG tablet Take 5 mg by mouth at bedtime.  6  . magnesium oxide (MAG-OX) 400 MG tablet Take 1 tablet (400 mg total) by mouth daily. 90 tablet 3  . Ostomy Supplies KIT 8514 is number on bag one piece unit. Colostomy bag and adhesive and alcohol prep, no sting, cannot use regular prep pad. 30 each 11  . PARoxetine (PAXIL) 40 MG tablet Take 0.5 tablets (20 mg total) by mouth 2 (two) times daily. 90 tablet 3  . vitamin B-12 (CYANOCOBALAMIN) 1000 MCG tablet Take 1,000 mcg by mouth at bedtime.     . Vitamin D, Ergocalciferol, (DRISDOL) 1.25 MG (50000 UNIT) CAPS capsule TAKE 1 CAPSULE BY MOUTH 1 TIME A WEEK 12 capsule 0  . revefenacin (YUPELRI) 175 MCG/3ML nebulizer solution Take 3 mLs (175 mcg total) by nebulization daily. 90 mL 6   No current facility-administered medications for this visit.   Facility-Administered Medications Ordered in Other Visits  Medication Dose Route Frequency Provider Last Rate Last Admin  .  sodium phosphate (FLEET) 7-19 GM/118ML enema 2 enema  2 enema Rectal Once Ileana Roup, MD        Allergies: Penicillins; Aleve [naproxen sodium]; Antihistamines, chlorpheniramine-type; Aspirin; Benadryl [diphenhydramine hcl]; Codeine; Hydrochlorothiazide; Rocephin [ceftriaxone sodium in dextrose]; and Sulfa antibiotics  Past Medical History:  Diagnosis Date  . 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 Anemia   . Anxiety   . Atrial fibrillation ([place])   . Basal cell carcinoma of skin of nose   . CHF (congestive heart failure) ([place])  11/2015  . Chronic bronchitis ([place])   . Chronic depression   . Colostomy in place [facility]) 07/2016  . Diverticular disease   . Fibrocystic disease of breast   . Fracture of shoulder   . GERD (gastroesophageal reflux disease)   . GI bleed 12/10/2016   in setting of no PPI. 5/2 EGD: grade A reflux esophagitis.  Mild, non-hemorrhagic gastritis.  Ablation of 3 Duodenal AVMs  . Herpes zoster   . History of blood transfusion    for vaginal, uterine bleeding leading to hysterectomy.  in 12/2016 transfused x 1 for GI bleed.   Marland Kitchen History of hiatal hernia   . Hyperlipidemia   . Hypertension   . Labyrinthitis   . Lung cancer ([place])    RML  . Obesity   . On home oxygen therapy    "3L just at night when I sleep" (04/20/2018)  . Paroxysmal supraventricular tachycardia ([place])   . Peptic ulcer of stomach    hx  . Pneumonia 1980s X 1; 08/2016   "walking; double"  . Presence of permanent cardiac pacemaker 04/20/2018  . Pulmonary embolism ([place]) 05/2014   "left lung"  . Seasonal allergies    "bad" (04/20/2018)  . Vitamin D deficiency     Past Surgical History:  Procedure Laterality Date  . APPENDECTOMY  1959  . BASAL CELL CARCINOMA EXCISION     nose x 2  . BREAST CYST ASPIRATION Right 2016?  Marland Kitchen CATARACT EXTRACTION W/ INTRAOCULAR LENS  IMPLANT, BILATERAL Bilateral 1990s  . CHOLECYSTECTOMY OPEN  1980s  . COLON SURGERY    . COLOSTOMY N/A 07/29/2016   Procedure: COLOSTOMY;  Surgeon: Clovis Riley, MD;  Location: [place];  Service: General;  Laterality: N/A;  . DILATION AND CURETTAGE OF UTERUS    . ESOPHAGOGASTRODUODENOSCOPY N/A 12/11/2016   Procedure: ESOPHAGOGASTRODUODENOSCOPY (EGD);  Surgeon: Jerene Bears, MD;  [facility] ENDOSCOPY;  Service: Endoscopy;  Laterality: N/A;  . EVALUATION UNDER ANESTHESIA WITH HEMORRHOIDECTOMY AND PROCTOSCOPY N/A 07/25/2016   Procedure: EXAM UNDER ANESTHESIA, EXCISION PERIANAL MASS.;  Surgeon: Judeth Horn, MD;  Location: [place];  Service: General;  Laterality: N/A;  Prone  position  . FLEXIBLE SIGMOIDOSCOPY N/A 06/16/2017   Procedure: FLEXIBLE SIGMOIDOSCOPY EXAM UNDER ANESHESIA;  Surgeon: Ileana Roup, MD;  Location: [facility];  Service: General;  Laterality: N/A;  . INSERT / REPLACE / REMOVE PACEMAKER  04/20/2018  . IR RADIOLOGIST EVAL & MGMT  03/25/2017  . LAPAROSCOPIC DIVERTED COLOSTOMY N/A 07/29/2016   Procedure: ATTEMPTED LAPAROSCOPIC ASSISTED COLOSTOMY;  Surgeon: Clovis Riley, MD;  Location: [place];  Service: General;  Laterality: N/A;  . LUNG REMOVAL, PARTIAL Right 2013   RML mass/carcinoid, Dr Lurena Nida  . PACEMAKER IMPLANT N/A 04/20/2018   Procedure: PACEMAKER IMPLANT;  Surgeon: Evans Lance, MD;  Location: [place] [facility];  Service: Cardiovascular;  Laterality: N/A;  . SQUAMOUS CELL CARCINOMA EXCISION  rectum, right leg  . TONSILLECTOMY AND ADENOIDECTOMY  1960s  . VAGINAL HYSTERECTOMY  1959   "partial"    Family History  Problem Relation Age of Onset  . Heart attack Mother        Dec 1987  . CVA Mother   . Hypertension Mother   . Multiple myeloma Mother   . Breast cancer Sister   . Skin cancer Sister   . Prostate cancer Brother   . Throat cancer Brother   . Cervical cancer Daughter   . Leukemia Daughter   . Leukemia Son   . Throat cancer Brother   . Cancer Brother        soft palette  . Heart Problems Brother        Pacemaker  . Colon cancer Neg Hx   . Pancreatic cancer Neg Hx     Social History   Tobacco Use  . Smoking status: Former Smoker    Packs/day: 0.00    Types: Cigarettes  . Smokeless tobacco: Never Used  . Tobacco comment: 04/20/2018 "smoked once in awhile in the 1960s; not more that 4 packs total in my whole life"  Substance Use Topics  . Alcohol use: Never    Subjective:   Accompanied by daughter; presents for yearly visit; is having some increased problems with arthritis- using Tylenol as needed; feels that her 2nd and 3rd toes on left foot are "numb." Has seen her pulmonologist today already  and scheduled to see her orthopedist later this afternoon for injection in her knee;   Objective:  Vitals:   10/31/20 1110  BP: (!) 146/70  Pulse: 68  Temp: 98.9 F (37.2 C)  TempSrc: Oral  SpO2: 95%  Weight: 236 lb 9.6 oz (107.3 kg)  Height: 5' 4" (1.626 m)    General: Well developed, well nourished, in no acute distress  Skin : Warm and dry.  Head: Normocephalic and atraumatic  Eyes: Sclera and conjunctiva clear; pupils round and reactive to light; extraocular movements intact  Ears: External normal; canals clear; tympanic membranes normal  Oropharynx: Pink, supple. No suspicious lesions  Neck: Supple without thyromegaly, adenopathy  Lungs: Respirations unlabored; clear to auscultation bilaterally without wheeze, rales, rhonchi  CVS exam: normal rate and regular rhythm.  Musculoskeletal: No deformities; no active joint inflammation  Extremities: No edema, cyanosis, clubbing  Vessels: Symmetric bilaterally  Neurologic:Speech intact; face symmetrical; uses walker for support;  Assessment:  1. Numbness and tingling   2. Muscle cramps   3. Adjustment disorder with mixed anxiety and depressed mood   4. Mixed hyperlipidemia     Plan:  1. & 2. Will update labs today with plan for determining follow up based on labs;  3. Stable on current regimen- refill updated; 4. Check lipid panel today; continue same medication;  This visit occurred during the SARS-CoV-2 public health emergency.  Safety protocols were in place, including screening questions prior to the visit, additional usage of staff PPE, and extensive cleaning of exam room while observing appropriate contact time as indicated for disinfecting solutions.    No follow-ups on file.  Orders Placed This Encounter  Procedures  . Vitamin B12    Standing Status:   Future    Number of Occurrences:   1    Standing Expiration Date:   10/31/2021  . Magnesium    Standing Status:   Future    Number of Occurrences:   1     Standing Expiration Date:   10/31/2021  Requested Prescriptions   Signed Prescriptions Disp Refills  . PARoxetine (PAXIL) 40 MG tablet 90 tablet 3    Sig: Take 0.5 tablets (20 mg total) by mouth 2 (two) times daily.

## 2020-10-31 NOTE — Patient Instructions (Signed)
Glad you are doing well with regard to your breathing I have reviewed your CT scan from last year which looks stable with no worrisome changes  Continue the Brovana and Pulmicort nebs We will change the duo nebs to twice a day as needed Add Yupelri nebs once a day  Follow-up in 1 year.

## 2020-11-06 ENCOUNTER — Ambulatory Visit: Payer: Medicare Other | Admitting: Family

## 2020-11-10 ENCOUNTER — Encounter: Payer: Self-pay | Admitting: Hematology and Oncology

## 2020-12-25 ENCOUNTER — Other Ambulatory Visit: Payer: Self-pay | Admitting: Hematology and Oncology

## 2021-01-16 ENCOUNTER — Ambulatory Visit (INDEPENDENT_AMBULATORY_CARE_PROVIDER_SITE_OTHER): Payer: Medicare Other

## 2021-01-16 DIAGNOSIS — I495 Sick sinus syndrome: Secondary | ICD-10-CM

## 2021-01-16 LAB — CUP PACEART REMOTE DEVICE CHECK
Battery Remaining Longevity: 106 mo
Battery Remaining Percentage: 95.5 %
Battery Voltage: 2.99 V
Brady Statistic AP VP Percent: 1.1 %
Brady Statistic AP VS Percent: 82 %
Brady Statistic AS VP Percent: 1 %
Brady Statistic AS VS Percent: 16 %
Brady Statistic RA Percent Paced: 78 %
Brady Statistic RV Percent Paced: 2.1 %
Date Time Interrogation Session: 20220607020014
Implantable Lead Implant Date: 20190909
Implantable Lead Implant Date: 20190909
Implantable Lead Location: 753859
Implantable Lead Location: 753860
Implantable Lead Model: 3830
Implantable Pulse Generator Implant Date: 20190909
Lead Channel Impedance Value: 360 Ohm
Lead Channel Impedance Value: 430 Ohm
Lead Channel Pacing Threshold Amplitude: 0.75 V
Lead Channel Pacing Threshold Amplitude: 0.75 V
Lead Channel Pacing Threshold Pulse Width: 0.5 ms
Lead Channel Pacing Threshold Pulse Width: 1 ms
Lead Channel Sensing Intrinsic Amplitude: 1.4 mV
Lead Channel Sensing Intrinsic Amplitude: 2.4 mV
Lead Channel Setting Pacing Amplitude: 1.75 V
Lead Channel Setting Pacing Amplitude: 2.5 V
Lead Channel Setting Pacing Pulse Width: 1 ms
Lead Channel Setting Sensing Sensitivity: 0.7 mV
Pulse Gen Model: 2272
Pulse Gen Serial Number: 9061801

## 2021-01-22 ENCOUNTER — Other Ambulatory Visit: Payer: Self-pay | Admitting: *Deleted

## 2021-01-22 DIAGNOSIS — C3490 Malignant neoplasm of unspecified part of unspecified bronchus or lung: Secondary | ICD-10-CM

## 2021-01-22 NOTE — Progress Notes (Signed)
Patient Care Team: Marrian Salvage, [place] as PCP - General (Internal Medicine)  DIAGNOSIS:    ICD-10-CM   1. Anal cancer ([place])  C21.0 CBC with Differential ([place])    CMP ([place] only)      SUMMARY OF ONCOLOGIC HISTORY: Oncology History  Lung cancer ([place])  Anal cancer ([place])  07/25/2016 Initial Diagnosis   Perianal: Squamous cell cancer atleast 5 cm, Moderately differentiated T2N1 (lung nodules, inguinal LN unclear etiology) stage 3B vs Stage 4    08/13/2016 -  Radiation Therapy   Anal Radiation (diverting colostomy)      CHIEF COMPLIANT: Annual follow-up of perianal squamous cell carcinoma   INTERVAL HISTORY: Tricia Hernandez is a 87 y.o. with above-mentioned history of perianal squamous cell carcinoma that was treated with annual radiation. CT CAP on 02/01/2020 showed no new or progressive findings to suggest recurrent or metastatic disease in chest, abdomen, or pelvis. Today she reports to the clinic for follow-up. Her major issue is frequent falls because of severe osteoarthritis.  She also has difficulty with sleeping and has fallen out of the bed many times.  ALLERGIES:  is allergic to penicillins; aleve [naproxen sodium]; antihistamines, chlorpheniramine-type; aspirin; benadryl [diphenhydramine hcl]; codeine; hydrochlorothiazide; rocephin [ceftriaxone sodium in dextrose]; and sulfa antibiotics.  MEDICATIONS:  Current Outpatient Medications  Medication Sig Dispense Refill   acetaminophen (TYLENOL) 325 MG tablet Take 650 mg by mouth daily as needed for moderate pain or headache.      anti-nausea (EMETROL) solution Take 10 mLs by mouth every 15 (fifteen) minutes as needed for nausea or vomiting.     arformoterol (BROVANA) 15 MCG/2ML NEBU INHALE CONTENTS OF 1 VIAL(2MLS) VIA NEBULIZER TWICE DAILY 120 mL 5   atorvastatin (LIPITOR) 20 MG tablet TAKE 1 TABLET(20 MG) BY MOUTH AT BEDTIME 90 tablet 3   Biotin 10000 MCG TABS Take 10,000 mcg by mouth at  bedtime.      budesonide (PULMICORT) 0.5 MG/2ML nebulizer solution USE 1 VIAL VIA NEBULIZER TWICE DAILY 360 mL 5   Calcium 600-200 MG-UNIT tablet Take 1 tablet by mouth at bedtime.      dofetilide (TIKOSYN) 500 MCG capsule TAKE ONE CAPSULE BY MOUTH TWICE DAILY 180 capsule 3   ferrous sulfate 325 (65 FE) MG EC tablet Take 325 mg by mouth 2 (two) times daily.     fluticasone (FLONASE) 50 MCG/ACT nasal spray Place 1 spray into both nostrils daily.     furosemide (LASIX) 20 MG tablet Take 1 tablet (20 mg total) by mouth as needed. 90 tablet 3   ipratropium-albuterol (DUONEB) 0.5-2.5 (3) MG/3ML SOLN Take 3 mLs by nebulization 2 (two) times daily. (Patient taking differently: Take 3 mLs by nebulization as needed.) 540 mL 0   levocetirizine (XYZAL) 5 MG tablet Take 5 mg by mouth at bedtime.  6   magnesium oxide (MAG-OX) 400 MG tablet Take 1 tablet (400 mg total) by mouth daily. 90 tablet 3   Ostomy Supplies KIT 8514 is number on bag one piece unit. Colostomy bag and adhesive and alcohol prep, no sting, cannot use regular prep pad. 30 each 11   PARoxetine (PAXIL) 40 MG tablet Take 0.5 tablets (20 mg total) by mouth 2 (two) times daily. 90 tablet 3   revefenacin (YUPELRI) 175 MCG/3ML nebulizer solution Take 3 mLs (175 mcg total) by nebulization daily. 90 mL 6   vitamin B-12 (CYANOCOBALAMIN) 1000 MCG tablet Take 1,000 mcg by mouth at bedtime.      Vitamin D,  Ergocalciferol, (DRISDOL) 1.25 MG (50000 UNIT) CAPS capsule TAKE 1 CAPSULE BY MOUTH 1 TIME A WEEK 12 capsule 0   No current facility-administered medications for this visit.   Facility-Administered Medications Ordered in Other Visits  Medication Dose Route Frequency Provider Last Rate Last Admin   sodium phosphate (FLEET) 7-19 GM/118ML enema 2 enema  2 enema Rectal Once Ileana Roup, MD        PHYSICAL EXAMINATION: ECOG PERFORMANCE STATUS: 1 - Symptomatic but completely ambulatory  Vitals:   01/23/21 1100  BP: 124/76  Pulse: 100   Resp: 18  Temp: (!) 97.5 F (36.4 C)  SpO2: 95%   Filed Weights   01/23/21 1100  Weight: 245 lb (111.1 kg)     LABORATORY DATA:  I have reviewed the data as listed CMP Latest Ref Rng & Units 01/23/2021 10/31/2020 02/01/2020  Glucose 70 - 99 mg/dL 89 90 92  BUN 8 - 23 mg/dL _0 Creatinine 0.44 - 1.00 mg/dL 0.82 0.92 0.97  Sodium 135 - 145 mmol/L 145 144 143  Potassium 3.5 - 5.1 mmol/L 4.7 4.7 5.4(H)  Chloride 98 - 111 mmol/L 105 102 104  CO2 22 - 32 mmol/L 31 37(H) 32  Calcium 8.9 - 10.3 mg/dL 8.9 9.3 9.1  Total Protein 6.5 - 8.1 g/dL 6.1(L) 6.3 6.8  Total Bilirubin 0.3 - 1.2 mg/dL 0.5 0.5 0.5  Alkaline Phos 38 - 126 U/L 83 82 98  AST 15 - 41 U/L 24 29 37  ALT 0 - 44 U/L 17 32 42    Lab Results  Component Value Date   WBC 5.6 01/23/2021   HGB 10.2 (L) 01/23/2021   HCT 33.0 (L) 01/23/2021   MCV 89.7 01/23/2021   PLT 128 (L) 01/23/2021   NEUTROABS 4.3 01/23/2021    ASSESSMENT & PLAN:  Anal cancer ([place]) Squamous cell carcinoma of the anus: Patient has bilateral inguinal lymphadenopathy and bilateral lung nodules. It is unclear but the inguinal lymphadenopathy and the lung nodules could be metastatic disease.  it could be a stage IIIa versus stage IV.   Treatment summary 08/13/2016-09/25/2016 : XRT to anus S/P diverting colostomy   CT CAP:12/30/2016: Nonspecific wall thickening posteriorly lower left rectum inguinal adenopathy decreased stable bilateral lung nodules favor benign   Severe anemia from GI bleeding: Stopped after discontinuing anticoagulation CT CAP 02/08/2019: Stable pulmonary nodules no evidence of metastatic disease. Blood work was reviewed.  I do not recommend routine scans anymore at this time.  Patient is asymptomatic.   Mild anemia: Anemia due to chronic disease. Frequent falls and mobility issues and difficulty with sleeping lying down: I sent a prescription for hospital bed. scans 02/01/20: Stable exam, and stable pulmonary nodules  bilaterally, left lower quadrant sigmoid end colostomy  Return to clinic in 1 year for follow-up with labs    Orders Placed This Encounter  Procedures   CBC with Differential ([place] Only)    Standing Status:   Future    Standing Expiration Date:   01/23/2022   CMP ([place] only)    Standing Status:   Future    Standing Expiration Date:   01/23/2022    The patient has a good understanding of the overall plan. she agrees with it. she will call with any problems that may develop before the next visit here.  Total time spent: 30 mins including face to face time and time spent for planning, charting and coordination of care  Rulon Eisenmenger, MD,  MPH 01/23/2021  I, Thana Ates, am acting as scribe for Dr. Nicholas Lose.  I have reviewed the above documentation for accuracy and completeness, and I agree with the above.

## 2021-01-23 ENCOUNTER — Inpatient Hospital Stay: Payer: Medicare Other | Attending: Hematology and Oncology | Admitting: Hematology and Oncology

## 2021-01-23 ENCOUNTER — Other Ambulatory Visit: Payer: Self-pay

## 2021-01-23 ENCOUNTER — Inpatient Hospital Stay: Payer: Medicare Other

## 2021-01-23 DIAGNOSIS — Z886 Allergy status to analgesic agent status: Secondary | ICD-10-CM | POA: Insufficient documentation

## 2021-01-23 DIAGNOSIS — R590 Localized enlarged lymph nodes: Secondary | ICD-10-CM | POA: Diagnosis not present

## 2021-01-23 DIAGNOSIS — Z933 Colostomy status: Secondary | ICD-10-CM | POA: Diagnosis not present

## 2021-01-23 DIAGNOSIS — R296 Repeated falls: Secondary | ICD-10-CM | POA: Diagnosis not present

## 2021-01-23 DIAGNOSIS — R911 Solitary pulmonary nodule: Secondary | ICD-10-CM | POA: Diagnosis not present

## 2021-01-23 DIAGNOSIS — D638 Anemia in other chronic diseases classified elsewhere: Secondary | ICD-10-CM | POA: Insufficient documentation

## 2021-01-23 DIAGNOSIS — Z882 Allergy status to sulfonamides status: Secondary | ICD-10-CM | POA: Insufficient documentation

## 2021-01-23 DIAGNOSIS — C210 Malignant neoplasm of anus, unspecified: Secondary | ICD-10-CM | POA: Diagnosis present

## 2021-01-23 DIAGNOSIS — Z79899 Other long term (current) drug therapy: Secondary | ICD-10-CM | POA: Diagnosis not present

## 2021-01-23 DIAGNOSIS — Z888 Allergy status to other drugs, medicaments and biological substances status: Secondary | ICD-10-CM | POA: Diagnosis not present

## 2021-01-23 DIAGNOSIS — C3490 Malignant neoplasm of unspecified part of unspecified bronchus or lung: Secondary | ICD-10-CM

## 2021-01-23 DIAGNOSIS — Z881 Allergy status to other antibiotic agents status: Secondary | ICD-10-CM | POA: Diagnosis not present

## 2021-01-23 DIAGNOSIS — Z880 Allergy status to penicillin: Secondary | ICD-10-CM | POA: Diagnosis not present

## 2021-01-23 DIAGNOSIS — Z885 Allergy status to narcotic agent status: Secondary | ICD-10-CM | POA: Diagnosis not present

## 2021-01-23 LAB — CBC WITH DIFFERENTIAL (CANCER CENTER ONLY)
Abs Immature Granulocytes: 0.01 10*3/uL (ref 0.00–0.07)
Basophils Absolute: 0 10*3/uL (ref 0.0–0.1)
Basophils Relative: 0 %
Eosinophils Absolute: 0.2 10*3/uL (ref 0.0–0.5)
Eosinophils Relative: 3 %
HCT: 33 % — ABNORMAL LOW (ref 36.0–46.0)
Hemoglobin: 10.2 g/dL — ABNORMAL LOW (ref 12.0–15.0)
Immature Granulocytes: 0 %
Lymphocytes Relative: 14 %
Lymphs Abs: 0.8 10*3/uL (ref 0.7–4.0)
MCH: 27.7 pg (ref 26.0–34.0)
MCHC: 30.9 g/dL (ref 30.0–36.0)
MCV: 89.7 fL (ref 80.0–100.0)
Monocytes Absolute: 0.4 10*3/uL (ref 0.1–1.0)
Monocytes Relative: 7 %
Neutro Abs: 4.3 10*3/uL (ref 1.7–7.7)
Neutrophils Relative %: 76 %
Platelet Count: 128 10*3/uL — ABNORMAL LOW (ref 150–400)
RBC: 3.68 MIL/uL — ABNORMAL LOW (ref 3.87–5.11)
RDW: 15.3 % (ref 11.5–15.5)
WBC Count: 5.6 10*3/uL (ref 4.0–10.5)
nRBC: 0 % (ref 0.0–0.2)

## 2021-01-23 LAB — CMP (CANCER CENTER ONLY)
ALT: 17 U/L (ref 0–44)
AST: 24 U/L (ref 15–41)
Albumin: 3.4 g/dL — ABNORMAL LOW (ref 3.5–5.0)
Alkaline Phosphatase: 83 U/L (ref 38–126)
Anion gap: 9 (ref 5–15)
BUN: 15 mg/dL (ref 8–23)
CO2: 31 mmol/L (ref 22–32)
Calcium: 8.9 mg/dL (ref 8.9–10.3)
Chloride: 105 mmol/L (ref 98–111)
Creatinine: 0.82 mg/dL (ref 0.44–1.00)
GFR, Estimated: >60 mL/min (ref >60)
Glucose, Bld: 89 mg/dL (ref 70–99)
Potassium: 4.7 mmol/L (ref 3.5–5.1)
Sodium: 145 mmol/L (ref 135–145)
Total Bilirubin: 0.5 mg/dL (ref 0.3–1.2)
Total Protein: 6.1 g/dL — ABNORMAL LOW (ref 6.5–8.1)

## 2021-01-23 NOTE — Assessment & Plan Note (Signed)
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 It is unclear but the inguinal lymphadenopathy and the lung nodulescould be metastatic disease. it could be a stage IIIa versus stage IV.  Treatmentsummary 08/13/2016-09/25/2016: XRT to anus S/P diverting colostomy  CT CAP:12/30/2016: Nonspecific wall thickening posteriorly lower left rectum inguinal adenopathy decreased stable bilateral lung nodules favor benign  Severe anemia from GI bleeding:Stopped after discontinuing anticoagulation CT CAP 02/08/2019:Stable pulmonary nodules no evidence of metastatic disease. Blood work was reviewed. Encouraged her to drink more water.  Mild anemia: Anemia due to chronic disease.  scans 02/01/20: Stable exam, and stable pulmonary nodules bilaterally, left lower quadrant sigmoid end colostomy  Return to clinic in 1 year for follow-upwith labs

## 2021-01-24 ENCOUNTER — Telehealth: Payer: Self-pay | Admitting: Hematology and Oncology

## 2021-01-24 NOTE — Progress Notes (Signed)
Progress notes successfully faxed to [facility] @ [phone].

## 2021-01-24 NOTE — Telephone Encounter (Signed)
Hospital bed recommendation  Patient will need a hospital bed because of her inability to reposition herself in a standard bed.  She also requires the head of the bed to be elevated more than 30 degrees.  Bed wedges are not sufficient enough. ICD 10 codes: M62.84 (generalized weakness); M54.9 (back pain) C21.1 (Anal cancer)

## 2021-02-07 NOTE — Progress Notes (Signed)
Remote pacemaker transmission.   

## 2021-02-16 ENCOUNTER — Telehealth: Payer: Self-pay

## 2021-02-16 NOTE — Telephone Encounter (Signed)
"  Merlin alert for HVR, long AT/AF.  Episode for AF 7/6, duration 23hrs, 43min, Burden 4.8%, one 38sec HVR, HR 165.  7/7 107min 48sec episode of AT.  Pt. prescribed Tikosyn, no OAC ntoed in EPIC.  Route to triage"  Merlin alert received for long AF episode 23 hours 02/14/21 and no OAC. No EGM of this episode available for review.At/AF burden remains consistent with previous transmissions at 4-5%. Patient with know history of Persistent AF. Spoke with patient just to follow up on her condition. States "my heart is doing good". Patient does admit to recent falls, last occurrence "3 saturdays ago". States oncologist has ordered a hospital bed for her. She is scheduled for her yearly follow up appointment with Dr. Lovena Le 05/15/21. Will continue to monitor.

## 2021-02-22 ENCOUNTER — Telehealth: Payer: Self-pay

## 2021-02-22 NOTE — Telephone Encounter (Signed)
Patient's daughter called asking that what was discuss with her mother on the 8th be discuss with her. Patient was able to tell daughter what was going on. Please advise

## 2021-02-22 NOTE — Telephone Encounter (Signed)
Nicolasa Ducking A   SS   4:07 PM Note Patient's daughter called asking that what was discuss with her mother on the 8th be discuss with her. Patient was able to tell daughter what was going on. Please advise     Returned call to Pt's daughter.  Advised Pt was contacted by device clinic related to an episode of afib.  No further action needed.

## 2021-03-04 ENCOUNTER — Other Ambulatory Visit: Payer: Self-pay | Admitting: Family

## 2021-03-04 DIAGNOSIS — F4323 Adjustment disorder with mixed anxiety and depressed mood: Secondary | ICD-10-CM

## 2021-03-17 ENCOUNTER — Other Ambulatory Visit: Payer: Self-pay | Admitting: Hematology and Oncology

## 2021-04-17 ENCOUNTER — Ambulatory Visit (INDEPENDENT_AMBULATORY_CARE_PROVIDER_SITE_OTHER): Payer: Medicare Other

## 2021-04-17 DIAGNOSIS — I495 Sick sinus syndrome: Secondary | ICD-10-CM

## 2021-04-17 LAB — CUP PACEART REMOTE DEVICE CHECK
Battery Remaining Longevity: 76 mo
Battery Remaining Percentage: 74 %
Battery Voltage: 2.99 V
Brady Statistic AP VP Percent: 1.5 %
Brady Statistic AP VS Percent: 78 %
Brady Statistic AS VP Percent: 1 %
Brady Statistic AS VS Percent: 20 %
Brady Statistic RA Percent Paced: 72 %
Brady Statistic RV Percent Paced: 3 %
Date Time Interrogation Session: 20220906020015
Implantable Lead Implant Date: 20190909
Implantable Lead Implant Date: 20190909
Implantable Lead Location: 753859
Implantable Lead Location: 753860
Implantable Lead Model: 3830
Implantable Pulse Generator Implant Date: 20190909
Lead Channel Impedance Value: 350 Ohm
Lead Channel Impedance Value: 430 Ohm
Lead Channel Pacing Threshold Amplitude: 0.625 V
Lead Channel Pacing Threshold Amplitude: 0.75 V
Lead Channel Pacing Threshold Pulse Width: 0.5 ms
Lead Channel Pacing Threshold Pulse Width: 1 ms
Lead Channel Sensing Intrinsic Amplitude: 1.2 mV
Lead Channel Sensing Intrinsic Amplitude: 1.9 mV
Lead Channel Setting Pacing Amplitude: 1.625
Lead Channel Setting Pacing Amplitude: 2.5 V
Lead Channel Setting Pacing Pulse Width: 1 ms
Lead Channel Setting Sensing Sensitivity: 0.7 mV
Pulse Gen Model: 2272
Pulse Gen Serial Number: 9061801

## 2021-04-25 NOTE — Progress Notes (Signed)
Remote pacemaker transmission.   

## 2021-05-15 ENCOUNTER — Encounter: Payer: Self-pay | Admitting: Internal Medicine

## 2021-05-15 ENCOUNTER — Other Ambulatory Visit: Payer: Self-pay

## 2021-05-15 ENCOUNTER — Ambulatory Visit (INDEPENDENT_AMBULATORY_CARE_PROVIDER_SITE_OTHER): Payer: Medicare Other | Admitting: Internal Medicine

## 2021-05-15 VITALS — BP 140/80 | HR 64 | Ht 64.0 in | Wt 251.0 lb

## 2021-05-15 DIAGNOSIS — I495 Sick sinus syndrome: Secondary | ICD-10-CM | POA: Diagnosis not present

## 2021-05-15 DIAGNOSIS — Z950 Presence of cardiac pacemaker: Secondary | ICD-10-CM

## 2021-05-15 DIAGNOSIS — I4819 Other persistent atrial fibrillation: Secondary | ICD-10-CM | POA: Diagnosis not present

## 2021-05-15 NOTE — Progress Notes (Signed)
HPI Tricia Hernandez returns today for followup. She is a pleasant 87 yo obese woman with a h/o HTN, PAF, on dofetilide, and sinus node dysfunction s/p PPM insertion. She admits to dietary indiscretion. Back in early August she had some increased atrial fib. Better now.  Allergies  Allergen Reactions   Penicillins Other (See Comments)    Reaction:  Unknown  Has patient had a PCN reaction causing immediate rash, facial/tongue/throat swelling, SOB or lightheadedness with hypotension: Unsure Has patient had a PCN reaction causing severe rash involving mucus membranes or skin necrosis: Unsure Has patient had a PCN reaction that required hospitalization Unsure Has patient had a PCN reaction occurring within the last 10 years: Unsure If all of the above answers are "NO", then may proceed with Cephalosporin use.    Aleve [Naproxen Sodium] Hives   Antihistamines, Chlorpheniramine-Type Rash   Aspirin Rash   Benadryl [Diphenhydramine Hcl] Palpitations and Other (See Comments)    Increases heart rate   Codeine Rash   Hydrochlorothiazide Rash   Rocephin [Ceftriaxone Sodium In Dextrose] Rash   Sulfa Antibiotics Rash     Current Outpatient Medications  Medication Sig Dispense Refill   acetaminophen (TYLENOL) 325 MG tablet Take 650 mg by mouth daily as needed for moderate pain or headache.      anti-nausea (EMETROL) solution Take 10 mLs by mouth every 15 (fifteen) minutes as needed for nausea or vomiting.     arformoterol (BROVANA) 15 MCG/2ML NEBU INHALE CONTENTS OF 1 VIAL(2MLS) VIA NEBULIZER TWICE DAILY 120 mL 5   atorvastatin (LIPITOR) 20 MG tablet TAKE 1 TABLET(20 MG) BY MOUTH AT BEDTIME 90 tablet 3   budesonide (PULMICORT) 0.5 MG/2ML nebulizer solution USE 1 VIAL VIA NEBULIZER TWICE DAILY 360 mL 5   Calcium 600-200 MG-UNIT tablet Take 1 tablet by mouth at bedtime.      dofetilide (TIKOSYN) 500 MCG capsule TAKE ONE CAPSULE BY MOUTH TWICE DAILY 180 capsule 3   ferrous sulfate 325 (65 FE)  MG EC tablet Take 325 mg by mouth 2 (two) times daily.     fluticasone (FLONASE) 50 MCG/ACT nasal spray Place 1 spray into both nostrils daily.     furosemide (LASIX) 20 MG tablet Take 1 tablet (20 mg total) by mouth as needed. 90 tablet 3   ipratropium-albuterol (DUONEB) 0.5-2.5 (3) MG/3ML SOLN Take 3 mLs by nebulization 2 (two) times daily. 540 mL 0   levocetirizine (XYZAL) 5 MG tablet Take 5 mg by mouth at bedtime.  6   magnesium oxide (MAG-OX) 400 MG tablet Take 1 tablet (400 mg total) by mouth daily. 90 tablet 3   Ostomy Supplies KIT 8514 is number on bag one piece unit. Colostomy bag and adhesive and alcohol prep, no sting, cannot use regular prep pad. 30 each 11   PARoxetine (PAXIL) 40 MG tablet TAKE 1/2 TABLET(20 MG) BY MOUTH TWICE DAILY 90 tablet 3   revefenacin (YUPELRI) 175 MCG/3ML nebulizer solution Take 3 mLs (175 mcg total) by nebulization daily. 90 mL 6   vitamin B-12 (CYANOCOBALAMIN) 1000 MCG tablet Take 1,000 mcg by mouth at bedtime.      Vitamin D, Ergocalciferol, (DRISDOL) 1.25 MG (50000 UNIT) CAPS capsule TAKE 1 CAPSULE BY MOUTH 1 TIME A WEEK 12 capsule 0   Vitamin D, Ergocalciferol, (DRISDOL) 1.25 MG (50000 UNIT) CAPS capsule TAKE 1 CAPSULE BY MOUTH 1 TIME A WEEK 12 capsule 0   No current facility-administered medications for this visit.   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 5/2 EGD: grade A reflux esophagitis.  Mild, non-hemorrhagic gastritis.  Ablation of 3 Duodenal AVMs   Herpes zoster    History of blood transfusion    for vaginal, uterine bleeding leading to hysterectomy.  in 12/2016 transfused x 1 for GI bleed.    History of hiatal hernia    Hyperlipidemia    Hypertension    Labyrinthitis    Lung cancer ([place])    RML   Obesity    On home oxygen therapy    "3L just at night when I sleep" (04/20/2018)   Paroxysmal supraventricular tachycardia (HCC)    Peptic ulcer of stomach    hx   Pneumonia 1980s X 1; 08/2016   "walking; double"   Presence of permanent cardiac pacemaker 04/20/2018   Pulmonary embolism ([place]) 05/2014   "left lung"   Seasonal allergies    "bad" (04/20/2018)   Vitamin D deficiency     ROS:   All systems reviewed and negative except as noted in the HPI.   Past Surgical History:  Procedure Laterality Date   APPENDECTOMY  1959   BASAL CELL CARCINOMA EXCISION     nose x 2   BREAST CYST ASPIRATION Right 2016?   CATARACT EXTRACTION W/ INTRAOCULAR LENS  IMPLANT, BILATERAL Bilateral 1990s   CHOLECYSTECTOMY OPEN  1980s   COLON SURGERY     COLOSTOMY N/A 07/29/2016   Procedure: COLOSTOMY;  Surgeon: Clovis Riley, MD;  Location: [place];  Service: General;  Laterality: N/A;   DILATION AND CURETTAGE OF UTERUS     ESOPHAGOGASTRODUODENOSCOPY N/A 12/11/2016   Procedure: ESOPHAGOGASTRODUODENOSCOPY (EGD);  Surgeon: Jerene Bears, MD;  Location: [facility] ENDOSCOPY;  Service: Endoscopy;  Laterality: N/A;   EVALUATION UNDER ANESTHESIA WITH HEMORRHOIDECTOMY AND PROCTOSCOPY N/A 07/25/2016   Procedure: EXAM UNDER ANESTHESIA, EXCISION PERIANAL MASS.;  Surgeon: Judeth Horn, MD;  Location: [place];  Service: General;  Laterality: N/A;  Prone position   FLEXIBLE SIGMOIDOSCOPY N/A 06/16/2017   Procedure: FLEXIBLE SIGMOIDOSCOPY EXAM UNDER ANESHESIA;   Surgeon: Ileana Roup, MD;  Location[facility];  Service: General;  Laterality: N/A;   INSERT / REPLACE / REMOVE PACEMAKER  04/20/2018   IR RADIOLOGIST EVAL & MGMT  03/25/2017   LAPAROSCOPIC DIVERTED COLOSTOMY N/A 07/29/2016   Procedure: ATTEMPTED LAPAROSCOPIC ASSISTED COLOSTOMY;  Surgeon: Clovis Riley, MD;  Location: [place];  Service: General;  Laterality: N/A;   LUNG REMOVAL, PARTIAL Right 2013   RML mass/carcinoid, Dr Lurena Nida   PACEMAKER IMPLANT N/A 04/20/2018   Procedure: PACEMAKER IMPLANT;  Surgeon: Evans Lance, MD;  Location: [place] CV LAB;  Service: Cardiovascular;  Laterality: N/A;   SQUAMOUS CELL CARCINOMA EXCISION     rectum, right leg   TONSILLECTOMY AND ADENOIDECTOMY  1960s   VAGINAL HYSTERECTOMY  1959   "partial"     Family History  Problem Relation Age of Onset   Heart attack Mother        Dec 1987   CVA Mother    Hypertension Mother    Multiple myeloma Mother    Breast cancer Sister    Skin cancer Sister    Prostate cancer Brother    Throat cancer Brother    Cervical cancer Daughter    Leukemia Daughter    Leukemia Son    Throat cancer Brother    Cancer Brother        soft palette   Heart Problems Brother        Pacemaker   Colon cancer Neg Hx    Pancreatic cancer Neg Hx      Social History   Socioeconomic History   Marital status: Widowed    Spouse name: Not on file   Number of children: 5   Years of education: Not on file   Highest education level: Not on file  Occupational History   Occupation: retired  Tobacco Use   Smoking status: Former    Packs/day: 0.00    Types: Cigarettes   Smokeless tobacco: Never   Tobacco comments:    04/20/2018 "smoked once in awhile in the 1960s; not more that 4 packs total in my whole life"  Vaping Use   Vaping Use: Never used  Substance and Sexual Activity   Alcohol use: Never   Drug use: Never   Sexual activity: Not Currently  Other Topics Concern   Not on file  Social History  Narrative   Patient is widowed and retired she has 5 children, she lives alone and performs all of her ADLs   Former smoker, never alcohol never drug use   Social Determinants of Radio broadcast assistant Strain: Not on file  Food Insecurity: Not on file  Transportation Needs: Not on file  Physical Activity: Not on file  Stress: Not on file  Social Connections: Not on file  Intimate Partner Violence: Not on file     BP 140/80 (BP Location: Left Arm, Patient Position: Sitting, Cuff Size: Normal)   Pulse 64   Ht _0  (1.626 m)   Wt 251 lb (113.9 kg)   BMI 43.08 kg/m   Physical Exam:  Well appearing NAD HEENT: Unremarkable Neck:  No JVD, no thyromegally Lymphatics:  No adenopathy Back:  No CVA tenderness Lungs:  Clear except for basilar rales.  HEART:  Regular rate rhythm, no murmurs, no rubs, no clicks Abd:  soft, positive bowel sounds, no organomegally, no rebound, no guarding Ext:  2 plus pulses, no edema, no cyanosis, no clubbing Skin:  No rashes no nodules Neuro:  CN II through XII intact, motor grossly intact  EKG - nsr with atrial pacing  DEVICE  Normal device function.  See PaceArt for details.   Assess/Plan:  1.  Paroxysmal atrial fibrillation -she is maintaining sinus rhythm over 96% of the time.  She will continue her current medications. 2.  Sinus node dysfunction -she is asymptomatic status post pacemaker insertion. 3.  Obesity -she has been unable to lose weight.  I have encouraged her to try to do so. 4.  Pacemaker -her [place] dual-chamber pacemaker is working normally.  No change in medications.   Cristopher Peru,  MD 

## 2021-05-15 NOTE — Patient Instructions (Addendum)
Medication Instructions:  Your physician recommends that you continue on your current medications as directed. Please refer to the Current Medication list given to you today.  Labwork: None ordered.  Testing/Procedures: None ordered.  Follow-Up: Your physician wants you to follow-up in: 9 months with Cristopher Peru, MD or one of the following Advanced Practice Providers on your designated Care Team:   Tommye Standard, [place] Legrand Como "Jonni Sanger" Chalmers Cater, [place]  Remote monitoring is used to monitor your Pacemaker from home. This monitoring reduces the number of office visits required to check your device to one time per year. It allows [place] to keep an eye on the functioning of your device to ensure it is working properly. You are scheduled for a device check from home on 07/17/2021. You may send your transmission at any time that day. If you have a wireless device, the transmission will be sent automatically. After your physician reviews your transmission, you will receive a postcard with your next transmission date.  Any Other Special Instructions Will Be Listed Below (If Applicable).  If you need a refill on your cardiac medications before your next appointment, please call your pharmacy.

## 2021-05-18 ENCOUNTER — Other Ambulatory Visit: Payer: Self-pay | Admitting: *Deleted

## 2021-05-18 MED ORDER — BUDESONIDE 0.5 MG/2ML IN SUSP: RESPIRATORY_TRACT | 5 refills | Status: DC

## 2021-05-28 ENCOUNTER — Telehealth: Payer: Self-pay

## 2021-05-28 NOTE — Telephone Encounter (Signed)
[facility] alert for long AT/AF Episode in progress from 10/14 @ 20:16, overall CVR. Burden 49%, OAC contraindicated secondary to AVM's Route to triage for increased burden.  Attempted to contact patient to send manual transmission and assess for symptoms and send manual transmission.

## 2021-05-28 NOTE — Telephone Encounter (Signed)
Patient daughter called back and I have let her know to send a transmission and once we receive it then a nurse will call her back. I let her know how to send one and she will get in touch with her mom and walk her through it

## 2021-05-29 NOTE — Telephone Encounter (Signed)
Spoke to patients daughter Collie Siad, [place]), advised remote transmission shows patient is back in normal rhythm. Patient has been asymptomatic and compliant with medications. Advise to continue to monitor at this time and call back if any symptoms arise. Verbalized understanding.

## 2021-05-29 NOTE — Telephone Encounter (Signed)
Transmission received 10/17 at 2:07 pm

## 2021-06-15 ENCOUNTER — Other Ambulatory Visit: Payer: Self-pay | Admitting: Family

## 2021-06-15 ENCOUNTER — Other Ambulatory Visit: Payer: Self-pay | Admitting: Internal Medicine

## 2021-06-15 DIAGNOSIS — E782 Mixed hyperlipidemia: Secondary | ICD-10-CM

## 2021-06-19 ENCOUNTER — Other Ambulatory Visit: Payer: Self-pay | Admitting: Hematology and Oncology

## 2021-06-21 ENCOUNTER — Telehealth: Payer: Self-pay | Admitting: Hematology and Oncology

## 2021-06-21 NOTE — Telephone Encounter (Signed)
Scheduled per sch msg. Called and left msg. Mailed printout  

## 2021-07-03 ENCOUNTER — Other Ambulatory Visit: Payer: Self-pay

## 2021-07-03 MED ORDER — DOFETILIDE 500 MCG PO CAPS: ORAL_CAPSULE | ORAL | 3 refills | Status: DC

## 2021-07-17 ENCOUNTER — Ambulatory Visit (INDEPENDENT_AMBULATORY_CARE_PROVIDER_SITE_OTHER): Payer: Medicare Other

## 2021-07-17 ENCOUNTER — Telehealth: Payer: Self-pay

## 2021-07-17 DIAGNOSIS — I495 Sick sinus syndrome: Secondary | ICD-10-CM

## 2021-07-17 LAB — CUP PACEART REMOTE DEVICE CHECK
Battery Remaining Longevity: 72 mo
Battery Remaining Percentage: 71 %
Battery Voltage: 2.98 V
Brady Statistic AP VP Percent: 1.3 %
Brady Statistic AP VS Percent: 77 %
Brady Statistic AS VP Percent: 1 %
Brady Statistic AS VS Percent: 21 %
Brady Statistic RA Percent Paced: 56 %
Brady Statistic RV Percent Paced: 5.8 %
Date Time Interrogation Session: 20221206020014
Implantable Lead Implant Date: 20190909
Implantable Lead Implant Date: 20190909
Implantable Lead Location: 753859
Implantable Lead Location: 753860
Implantable Lead Model: 3830
Implantable Pulse Generator Implant Date: 20190909
Lead Channel Impedance Value: 340 Ohm
Lead Channel Impedance Value: 430 Ohm
Lead Channel Pacing Threshold Amplitude: 0.5 V
Lead Channel Pacing Threshold Amplitude: 0.625 V
Lead Channel Pacing Threshold Pulse Width: 0.5 ms
Lead Channel Pacing Threshold Pulse Width: 1 ms
Lead Channel Sensing Intrinsic Amplitude: 2.2 mV
Lead Channel Sensing Intrinsic Amplitude: 2.9 mV
Lead Channel Setting Pacing Amplitude: 1.625
Lead Channel Setting Pacing Amplitude: 2.5 V
Lead Channel Setting Pacing Pulse Width: 1 ms
Lead Channel Setting Sensing Sensitivity: 0.7 mV
Pulse Gen Model: 2272
Pulse Gen Serial Number: 9061801

## 2021-07-17 NOTE — Telephone Encounter (Signed)
Scheduled remote reviewed. Normal device function.    AF burden 29% (increased from 7%, longest episode 48hrs. On Tikosyn, no OAC due to reported history of AVM's/GI bleed. Presenting rhythm AF with controlled rates. Onset appears with current episode 07/16/21. Routing for further review due to increased AF burden.   Attempted to reach pt to assess symptoms and medication compliance.  Phone number on record belongs to pt daughter, Collie Siad.  LVM with device clinic # and hours to return call.

## 2021-07-17 NOTE — Telephone Encounter (Signed)
Spoke with patient's daughter.  She reports that patient has been under increased stress related to the passing of the patient's brother.  Patient ahs not complained of any symptoms.  Daughter is not with patient now to send a follow-up transmission.  She will be able to do that Thursday morning.    Advised we will watch for manual transmission on 07/19/21, if pt still in AF, notify MD for recommendation.

## 2021-07-20 NOTE — Telephone Encounter (Signed)
Follow-up transmission received.  Pt no longer in AF.  Presenting rate APVS.

## 2021-07-26 NOTE — Progress Notes (Signed)
Remote pacemaker transmission.   

## 2021-09-05 ENCOUNTER — Telehealth: Payer: Self-pay | Admitting: Family

## 2021-09-05 NOTE — Telephone Encounter (Signed)
Patient daughter calling in  Wants to know if it is okay to transfer patient care from [place] to [place] bc [place] office is closer   Want to make sure before scheduling patient appt

## 2021-09-05 NOTE — Telephone Encounter (Signed)
Please do not schedule her with me.  Will not be accepting this transfer of care.  Schedule with different provider please.

## 2021-09-11 ENCOUNTER — Other Ambulatory Visit: Payer: Self-pay | Admitting: Hematology and Oncology

## 2021-10-16 ENCOUNTER — Ambulatory Visit (INDEPENDENT_AMBULATORY_CARE_PROVIDER_SITE_OTHER): Payer: Medicare Other

## 2021-10-16 DIAGNOSIS — I495 Sick sinus syndrome: Secondary | ICD-10-CM

## 2021-10-16 LAB — CUP PACEART REMOTE DEVICE CHECK
Battery Remaining Longevity: 70 mo
Battery Remaining Percentage: 69 %
Battery Voltage: 2.98 V
Brady Statistic AP VP Percent: 2.4 %
Brady Statistic AP VS Percent: 71 %
Brady Statistic AS VP Percent: 1.1 %
Brady Statistic AS VS Percent: 25 %
Brady Statistic RA Percent Paced: 55 %
Brady Statistic RV Percent Paced: 6.4 %
Date Time Interrogation Session: 20230307020013
Implantable Lead Implant Date: 20190909
Implantable Lead Implant Date: 20190909
Implantable Lead Location: 753859
Implantable Lead Location: 753860
Implantable Lead Model: 3830
Implantable Pulse Generator Implant Date: 20190909
Lead Channel Impedance Value: 340 Ohm
Lead Channel Impedance Value: 430 Ohm
Lead Channel Pacing Threshold Amplitude: 0.5 V
Lead Channel Pacing Threshold Amplitude: 0.5 V
Lead Channel Pacing Threshold Pulse Width: 0.5 ms
Lead Channel Pacing Threshold Pulse Width: 1 ms
Lead Channel Sensing Intrinsic Amplitude: 1.3 mV
Lead Channel Sensing Intrinsic Amplitude: 2.3 mV
Lead Channel Setting Pacing Amplitude: 1.5 V
Lead Channel Setting Pacing Amplitude: 2.5 V
Lead Channel Setting Pacing Pulse Width: 1 ms
Lead Channel Setting Sensing Sensitivity: 0.7 mV
Pulse Gen Model: 2272
Pulse Gen Serial Number: 9061801

## 2021-10-29 NOTE — Progress Notes (Signed)
Remote pacemaker transmission.   

## 2021-11-06 ENCOUNTER — Ambulatory Visit (INDEPENDENT_AMBULATORY_CARE_PROVIDER_SITE_OTHER): Payer: Medicare Other | Admitting: Family

## 2021-11-06 VITALS — BP 122/80 | HR 57 | Temp 97.6°F | Resp 18 | Ht 64.0 in | Wt 245.2 lb

## 2021-11-06 DIAGNOSIS — J309 Allergic rhinitis, unspecified: Secondary | ICD-10-CM

## 2021-11-06 DIAGNOSIS — E782 Mixed hyperlipidemia: Secondary | ICD-10-CM

## 2021-11-06 DIAGNOSIS — F4323 Adjustment disorder with mixed anxiety and depressed mood: Secondary | ICD-10-CM | POA: Diagnosis not present

## 2021-11-06 LAB — CBC WITH DIFFERENTIAL/PLATELET
Basophils Absolute: 0 10*3/uL (ref 0.0–0.1)
Basophils Relative: 0.3 % (ref 0.0–3.0)
Eosinophils Absolute: 0.2 10*3/uL (ref 0.0–0.7)
Eosinophils Relative: 3.2 % (ref 0.0–5.0)
HCT: 34.6 % — ABNORMAL LOW (ref 36.0–46.0)
Hemoglobin: 10.9 g/dL — ABNORMAL LOW (ref 12.0–15.0)
Lymphocytes Relative: 19 % (ref 12.0–46.0)
Lymphs Abs: 1.1 10*3/uL (ref 0.7–4.0)
MCHC: 31.4 g/dL (ref 30.0–36.0)
MCV: 82.9 fl (ref 78.0–100.0)
Monocytes Absolute: 0.4 10*3/uL (ref 0.1–1.0)
Monocytes Relative: 6.7 % (ref 3.0–12.0)
Neutro Abs: 4 10*3/uL (ref 1.4–7.7)
Neutrophils Relative %: 70.8 % (ref 43.0–77.0)
Platelets: 126 10*3/uL — ABNORMAL LOW (ref 150.0–400.0)
RBC: 4.18 Mil/uL (ref 3.87–5.11)
RDW: 17.6 % — ABNORMAL HIGH (ref 11.5–15.5)
WBC: 5.7 10*3/uL (ref 4.0–10.5)

## 2021-11-06 LAB — COMPREHENSIVE METABOLIC PANEL
ALT: 14 U/L (ref 0–35)
AST: 20 U/L (ref 0–37)
Albumin: 4.1 g/dL (ref 3.5–5.2)
Alkaline Phosphatase: 102 U/L (ref 39–117)
BUN: 19 mg/dL (ref 6–23)
CO2: 33 mEq/L — ABNORMAL HIGH (ref 19–32)
Calcium: 9.2 mg/dL (ref 8.4–10.5)
Chloride: 102 mEq/L (ref 96–112)
Creatinine, Ser: 0.91 mg/dL (ref 0.40–1.20)
GFR: 56.41 mL/min — ABNORMAL LOW (ref >60.00)
Glucose, Bld: 91 mg/dL (ref 70–99)
Potassium: 4.6 mEq/L (ref 3.5–5.1)
Sodium: 142 mEq/L (ref 135–145)
Total Bilirubin: 0.4 mg/dL (ref 0.2–1.2)
Total Protein: 6 g/dL (ref 6.0–8.3)

## 2021-11-06 LAB — LIPID PANEL
Cholesterol: 148 mg/dL (ref 0–200)
HDL: 48.6 mg/dL (ref >39.00)
NonHDL: 99.55
Total CHOL/HDL Ratio: 3
Triglycerides: 234 mg/dL — ABNORMAL HIGH (ref 0.0–149.0)
VLDL: 46.8 mg/dL — ABNORMAL HIGH (ref 0.0–40.0)

## 2021-11-06 LAB — LDL CHOLESTEROL, DIRECT: Direct LDL: 65 mg/dL

## 2021-11-06 MED ORDER — PAROXETINE HCL 40 MG PO TABS: ORAL_TABLET | ORAL | 3 refills | Status: DC

## 2021-11-06 MED ORDER — FEXOFENADINE HCL 180 MG PO TABS: 180.0000 mg | ORAL_TABLET | Freq: Every day | ORAL | 3 refills | Status: DC

## 2021-11-06 MED ORDER — ATORVASTATIN CALCIUM 20 MG PO TABS: ORAL_TABLET | ORAL | 3 refills | Status: DC

## 2021-11-06 NOTE — Progress Notes (Signed)
?Tricia Hernandez is a 88 y.o. female with the following history as recorded in EpicCare:  ?Patient Active Problem List  ? Diagnosis Date Noted  ? 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 Anxiety and depression 11/02/2018  ? Allergic rhinitis 11/02/2018  ? Pacemaker 04/20/2018  ? Sinus node dysfunction ([place]) 04/14/2018  ? Vitamin D deficiency 05/27/2017  ? Venous stasis dermatitis of both lower extremities 02/23/2017  ? Anemia 02/22/2017  ? Acid reflux 02/22/2017  ? Colostomy care ([place]) 12/20/2016  ? Angiodysplasia of duodenum   ? GI bleeding 12/10/2016  ? Mixed hyperlipidemia 11/05/2016  ? Chronic bronchitis ([place]) 11/04/2016  ? Persistent atrial fibrillation ([place]) 10/07/2016  ? Acute respiratory failure with hypoxia ([place]) 08/26/2016  ? Essential hypertension   ? Pulmonary nodules   ? Palliative care encounter   ? Anal cancer ([place]) 07/22/2016  ? Atrial fibrillation ([place]), CHA2DS2-VASc Score 5 07/22/2016  ? Pulmonary embolus ([place])   ? Obesity   ? Lung cancer ([place])   ?  ?Current Outpatient Medications  ?Medication Sig Dispense Refill  ? acetaminophen (TYLENOL) 325 MG tablet Take 650 mg by mouth daily as needed for moderate pain or headache.     ? anti-nausea (EMETROL) solution Take 10 mLs by mouth every 15 (fifteen) minutes as needed for nausea or vomiting.    ? arformoterol (BROVANA) 15 MCG/2ML NEBU INHALE CONTENTS OF 1 VIAL(2MLS) VIA NEBULIZER TWICE DAILY 120 mL 5  ? budesonide (PULMICORT) 0.5 MG/2ML nebulizer solution Use 1 vial via nebulizer twice daily 360 mL 5  ? Calcium 600-200 MG-UNIT tablet Take 1 tablet by mouth at bedtime.     ? dofetilide (TIKOSYN) 500 MCG capsule TAKE ONE CAPSULE BY MOUTH TWICE DAILY 180 capsule 3  ? ferrous sulfate 325 (65 FE) MG EC tablet Take 325 mg by mouth 2 (two) times daily.    ? fexofenadine (ALLEGRA) 180 MG tablet Take 1 tablet (180 mg total) by mouth daily. 90 tablet 3  ? fluticasone (FLONASE) 50 MCG/ACT nasal spray Place 1 spray into both nostrils daily.     ? furosemide (LASIX) 20 MG tablet Take 1 tablet (20 mg total) by mouth as needed. 90 tablet 3  ? ipratropium-albuterol (DUONEB) 0.5-2.5 (3) MG/3ML SOLN Take 3 mLs by nebulization 2 (two) times daily. 540 mL 0  ? MAGNESIUM-OXIDE 400 (240 Mg) MG tablet TAKE 1 TABLET BY MOUTH DAILY 90 tablet 3  ? Ostomy Supplies KIT 8514 is number on bag one piece unit. ?Colostomy bag and adhesive and alcohol prep, no sting, cannot use regular prep pad. 30 each 11  ? revefenacin (YUPELRI) 175 MCG/3ML nebulizer solution Take 3 mLs (175 mcg total) by nebulization daily. 90 mL 6  ? vitamin B-12 (CYANOCOBALAMIN) 1000 MCG tablet Take 1,000 mcg by mouth at bedtime.     ? Vitamin D, Ergocalciferol, (DRISDOL) 1.25 MG (50000 UNIT) CAPS capsule TAKE 1 CAPSULE BY MOUTH 1 TIME A WEEK 12 capsule 1  ? Vitamin D, Ergocalciferol, (DRISDOL) 1.25 MG (50000 UNIT) CAPS capsule TAKE 1 CAPSULE BY MOUTH 1 TIME A WEEK 12 capsule 0  ? atorvastatin (LIPITOR) 20 MG tablet TAKE 1 TABLET(20 MG) BY MOUTH AT BEDTIME 90 tablet 3  ? PARoxetine (PAXIL) 40 MG tablet TAKE 1/2 TABLET(20 MG) BY MOUTH TWICE DAILY 90 tablet 3  ? ?No current facility-administered medications for this visit.  ? ?Facility-Administered Medications Ordered in Other Visits  ?Medication Dose Route Frequency Provider Last Rate Last Admin  ? sodium phosphate (FLEET) 7-19  GM/118ML enema 2 enema  2 enema Rectal Once Ileana Roup, MD      ?  ?Allergies: Penicillins; Aleve [naproxen sodium]; Antihistamines, chlorpheniramine-type; Aspirin; Benadryl [diphenhydramine hcl]; Codeine; Hydrochlorothiazide; Rocephin [ceftriaxone sodium in dextrose]; and Sulfa antibiotics  ?Past Medical History:  ?Diagnosis Date  ? 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 "spread to lymph nodes and groins"   ? Anemia   ? Anxiety   ? Atrial fibrillation ([place])   ? Basal cell carcinoma of skin of nose   ? CHF (congestive heart failure) ([place]) 11/2015  ? Chronic bronchitis ([place])   ? Chronic depression   ? Colostomy in  place [facility]) 07/2016  ? Diverticular disease   ? Fibrocystic disease of breast   ? Fracture of shoulder   ? GERD (gastroesophageal reflux disease)   ? GI bleed 12/10/2016  ? in setting of no PPI. 5/2 EGD: grade A reflux esophagitis.  Mild, non-hemorrhagic gastritis.  Ablation of 3 Duodenal AVMs  ? Herpes zoster   ? History of blood transfusion   ? for vaginal, uterine bleeding leading to hysterectomy.  in 12/2016 transfused x 1 for GI bleed.   ? History of hiatal hernia   ? Hyperlipidemia   ? Hypertension   ? Labyrinthitis   ? Lung cancer ([place])   ? RML  ? Obesity   ? On home oxygen therapy   ? "3L just at night when I sleep" (04/20/2018)  ? Paroxysmal supraventricular tachycardia ([place])   ? Peptic ulcer of stomach   ? hx  ? Pneumonia 1980s X 1; 08/2016  ? "walking; double"  ? Presence of permanent cardiac pacemaker 04/20/2018  ? Pulmonary embolism ([place]) 05/2014  ? "left lung"  ? Seasonal allergies   ? "bad" (04/20/2018)  ? Vitamin D deficiency   ?  ?Past Surgical History:  ?Procedure Laterality Date  ? APPENDECTOMY  1959  ? BASAL CELL CARCINOMA EXCISION    ? nose x 2  ? BREAST CYST ASPIRATION Right 2016?  ? CATARACT EXTRACTION W/ INTRAOCULAR LENS  IMPLANT, BILATERAL Bilateral 1990s  ? CHOLECYSTECTOMY OPEN  1980s  ? COLON SURGERY    ? COLOSTOMY N/A 07/29/2016  ? Procedure: COLOSTOMY;  Surgeon: Clovis Riley, MD;  Location: [place];  Service: General;  Laterality: N/A;  ? DILATION AND CURETTAGE OF UTERUS    ? ESOPHAGOGASTRODUODENOSCOPY N/A 12/11/2016  ? Procedure: ESOPHAGOGASTRODUODENOSCOPY (EGD);  Surgeon: Jerene Bears, MD;  Location[facility] ENDOSCOPY;  Service: Endoscopy;  Laterality: N/A;  ? EVALUATION UNDER ANESTHESIA WITH HEMORRHOIDECTOMY AND PROCTOSCOPY N/A 07/25/2016  ? Procedure: EXAM UNDER ANESTHESIA, EXCISION PERIANAL MASS.;  Surgeon: Judeth Horn, MD;  Location: [place];  Service: General;  Laterality: N/A;  Prone position  ? FLEXIBLE SIGMOIDOSCOPY N/A 06/16/2017  ? Procedure: FLEXIBLE SIGMOIDOSCOPY EXAM UNDER ANESHESIA;   Surgeon: Ileana Roup, MD;  [facility];  Service: General;  Laterality: N/A;  ? INSERT / REPLACE / REMOVE PACEMAKER  04/20/2018  ? IR RADIOLOGIST EVAL & MGMT  03/25/2017  ? LAPAROSCOPIC DIVERTED COLOSTOMY N/A 07/29/2016  ? Procedure: ATTEMPTED LAPAROSCOPIC ASSISTED COLOSTOMY;  Surgeon: Clovis Riley, MD;  Location: [place];  Service: General;  Laterality: N/A;  ? LUNG REMOVAL, PARTIAL Right 2013  ? RML mass/carcinoid, Dr Lurena Nida  ? PACEMAKER IMPLANT N/A 04/20/2018  ? Procedure: PACEMAKER IMPLANT;  Surgeon: Evans Lance, MD;  Location: [place] CV LAB;  Service: Cardiovascular;  Laterality: N/A;  ? SQUAMOUS CELL CARCINOMA EXCISION    ? rectum,  right leg  ? TONSILLECTOMY AND ADENOIDECTOMY  1960s  ? VAGINAL HYSTERECTOMY  1959  ? "partial"  ?  ?Family History  ?Problem Relation Age of Onset  ? Heart attack Mother   ?     Dec 1987  ? CVA Mother   ? Hypertension Mother   ? Multiple myeloma Mother   ? Breast cancer Sister   ? Skin cancer Sister   ? Prostate cancer Brother   ? Throat cancer Brother   ? Cervical cancer Daughter   ? Leukemia Daughter   ? Leukemia Son   ? Throat cancer Brother   ? Cancer Brother   ?     soft palette  ? Heart Problems Brother   ?     Pacemaker  ? Colon cancer Neg Hx   ? Pancreatic cancer Neg Hx   ?  ?Social History  ? ?Tobacco Use  ? Smoking status: Former  ?  Packs/day: 0.00  ?  Types: Cigarettes  ? Smokeless tobacco: Never  ? Tobacco comments:  ?  04/20/2018 "smoked once in awhile in the 1960s; not more that 4 packs total in my whole life"  ?Substance Use Topics  ? Alcohol use: Never  ?  ?Subjective:  ? ?Accompanied by her daughter; seen for yearly visit; majority of care is managed by her specialists including cardiology, pulmonology and dermatology;  ?Would like to get yearly labs updated today;  ? ? ? ?Objective:  ?Vitals:  ? 11/06/21 1008  ?BP: 122/80  ?Pulse: (!) 57  ?Resp: 18  ?Temp: 97.6 ?F (36.4 ?C)  ?TempSrc: Oral  ?SpO2: 95%  ?Weight: 245 lb 3.2 oz (111.2 kg)   ?Height: 5' 4"  (1.626 m)  ?  ?General: Well developed, well nourished, in no acute distress  ?Skin : Warm and dry.  ?Head: Normocephalic and atraumatic  ?Eyes: Sclera and conjunctiva clear; pupils roun

## 2021-11-12 ENCOUNTER — Other Ambulatory Visit: Payer: Self-pay | Admitting: *Deleted

## 2021-11-12 ENCOUNTER — Encounter: Payer: Self-pay | Admitting: Pulmonary Disease

## 2021-11-12 ENCOUNTER — Ambulatory Visit (INDEPENDENT_AMBULATORY_CARE_PROVIDER_SITE_OTHER): Payer: Medicare Other | Admitting: Pulmonary Disease

## 2021-11-12 VITALS — BP 138/68 | HR 72 | Temp 98.2°F | Ht 64.0 in | Wt 243.8 lb

## 2021-11-12 DIAGNOSIS — J449 Chronic obstructive pulmonary disease, unspecified: Secondary | ICD-10-CM | POA: Diagnosis not present

## 2021-11-12 MED ORDER — BUDESONIDE 0.5 MG/2ML IN SUSP: RESPIRATORY_TRACT | 11 refills | Status: DC

## 2021-11-12 MED ORDER — ARFORMOTEROL TARTRATE 15 MCG/2ML IN NEBU: INHALATION_SOLUTION | RESPIRATORY_TRACT | 11 refills | Status: DC

## 2021-11-12 MED ORDER — YUPELRI 175 MCG/3ML IN SOLN
175.0000 ug | Freq: Every day | RESPIRATORY_TRACT | 11 refills | Status: DC
Start: 2021-11-12 — End: 2022-12-06

## 2021-11-12 MED ORDER — IPRATROPIUM-ALBUTEROL 0.5-2.5 (3) MG/3ML IN SOLN
3.0000 mL | Freq: Two times a day (BID) | RESPIRATORY_TRACT | 3 refills | Status: DC
Start: 2021-11-12 — End: 2023-02-26

## 2021-11-12 NOTE — Progress Notes (Signed)
? ?      ?Tricia Hernandez    [id number]    04-19-1931 ? ?Primary Care Physician:Murray, Marvis Repress, FNP ? ?Referring Physician: Marrian Salvage, [place] [address] ?[address] ?[place],  [place] [address] ? ?Chief complaint:   ?Follow up of  ?COPD ?Lung nodules. S/p Lung resection for carcinoid-2013 ?H/o PE, on eliquis ? 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 She used to follow Dr. Audie Box, Pulmonologist at [place], [place].  ? 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 Work up included CT scan of the chest and PET scan which shows non avid stable lung nodules. Has H/O uncontrolled atrial fibrillation and loaded with Tikosyn. Also has sick sinus rhythm s/p PPM insertion in 2019.   ? ?Her anticoagulation was held in 2019 due GI bleed after discussion with Dr. Carlean Purl, GI and Dr. Lindi Adie, Oncology. She has been evaluated by IR for IVC filter. This has been deferred as she did not have any DVT or evidence of hypercoagulability. ? ?She has a remote smoking history of about 5 pack years and exposure to secondhand smoke. Quit smoking in 1975. 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 She has been exposed to a lot of dust and fibre inhalation during her work in [employer].  ? ?Interim history: ?She is doing well with no issues ?Complains of degenerative arthritis of the knee and knee pain but not a candidate for total knee replacement given comorbidities ?Continues on Brovana, Pulmicort and Yupelri nebs. ? ? ?Outpatient Encounter Medications as of 11/12/2021  ?Medication Sig  ? acetaminophen (TYLENOL) 325 MG tablet Take 650 mg by mouth daily as needed for moderate pain or headache.   ? anti-nausea (EMETROL) solution Take 10 mLs by mouth every 15 (fifteen) minutes as  needed for nausea or vomiting.  ? arformoterol (BROVANA) 15 MCG/2ML NEBU INHALE CONTENTS OF 1 VIAL(2MLS) VIA NEBULIZER TWICE DAILY  ? atorvastatin (LIPITOR) 20 MG tablet TAKE 1 TABLET(20 MG) BY MOUTH AT BEDTIME  ? budesonide (PULMICORT) 0.5 MG/2ML nebulizer solution Use 1 vial via nebulizer twice daily  ? Calcium 600-200 MG-UNIT tablet Take 1 tablet by mouth at bedtime.   ? dofetilide (TIKOSYN) 500 MCG capsule TAKE ONE CAPSULE BY MOUTH TWICE DAILY  ? ferrous sulfate 325 (65 FE) MG EC tablet Take 325 mg by mouth 2 (two) times daily.  ? fexofenadine (ALLEGRA) 180 MG tablet Take 1 tablet (180 mg total) by mouth daily.  ? fluticasone (FLONASE) 50 MCG/ACT nasal spray Place 1 spray into both nostrils daily.  ? furosemide (LASIX) 20 MG tablet Take 1 tablet (20 mg total) by mouth as needed.  ? ipratropium-albuterol (DUONEB) 0.5-2.5 (3) MG/3ML SOLN Take 3 mLs by nebulization 2 (two) times daily.  ? MAGNESIUM-OXIDE 400 (240 Mg) MG tablet TAKE 1 TABLET BY MOUTH DAILY  ? Ostomy Supplies KIT 8514 is number on bag one piece unit. ?Colostomy bag and adhesive and alcohol prep, no sting, cannot use regular prep pad.  ? PARoxetine (PAXIL) 40 MG tablet TAKE 1/2 TABLET(20 MG) BY MOUTH TWICE DAILY  ? revefenacin (YUPELRI) 175 MCG/3ML nebulizer solution Take 3 mLs (175 mcg total) by nebulization daily.  ? vitamin B-12 (CYANOCOBALAMIN) 1000 MCG tablet Take 1,000 mcg by mouth at bedtime.   ?  Vitamin D, Ergocalciferol, (DRISDOL) 1.25 MG (50000 UNIT) CAPS capsule TAKE 1 CAPSULE BY MOUTH 1 TIME A WEEK  ? Vitamin D, Ergocalciferol, (DRISDOL) 1.25 MG (50000 UNIT) CAPS capsule TAKE 1 CAPSULE BY MOUTH 1 TIME A WEEK  ? ?Facility-Administered Encounter Medications as of 11/12/2021  ?Medication  ? sodium phosphate (FLEET) 7-19 GM/118ML enema 2 enema  ? ?Physical Exam: ?Blood pressure 138/68, pulse 72, temperature 98.2 ?F (36.8 ?C), temperature source Oral, height _0  (1.626 m), weight 243 lb 12.8 oz (110.6 kg), SpO2 92 %. ?Gen:      No acute  distress ?HEENT:  EOMI, sclera anicteric ?Neck:     No masses; no thyromegaly ?Lungs:    Clear to auscultation bilaterally; normal respiratory effort ?CV:         Regular rate and rhythm; no murmurs ?Abd:      + bowel sounds; soft, non-tender; no palpable masses, no distension ?Ext:    No edema; adequate peripheral perfusion ?Skin:      Warm and dry; no rash ?Neuro: alert and oriented x 3 ?Psych: normal mood and affect  ? ?Data Reviewed: ?Imaging: ?CT chest 07/25/16-postsurgical changes in right middle lung, multiple subcentimeter pulmonary nodules. ?CTA chest 08/27/16-no pulmonary embolus, stable postsurgical changes, multiple subcentimeter pulmonary nodules. Consolidative changes in bilateral lungs, small bilateral pleural effusions. ?PET scan 09/16/16- resolution of consolidative changes, stable postsurgical changes, stable subcentimeter pulmonary nodules which are non-PET avid ?CT chest abdomen pelvis 12/30/2017- no evidence of recurrent metastatic disease in the abdomen or pelvis.  New pulmonary nodules in the right lower lobe.  Previously visualized pulmonary nodules are stable. ?CT chest abdomen pelvis 02/08/2019- no evidence of recurrent cancer.  Lung nodules are stable. ?CT chest abdomen pelvis 02/01/2020- no evidence of recurrent cancer.  Lung nodules are stable. ?I have reviewed all the images personally ? ?PFTs 02/25/17 ?FVC 1.43 [58%), FEV1 0.82 (45%), F/F 57, TLC 293%, RV/TLC 188% ?Severe obstruction with bronchodilator response, hyperinflation ? ?Labs: ?Alpha-1 antitrypsin 05/27/2017-141, PI MM ? ?Assessment:  ?Severe COPD ?PFTs show severe obstruction with hyperinflation and bronchodilator response (as shown by improvement in FVC].  ?Stable on Brovana, Pulmicort, Yupelri, Duo nebs to as needed ?Continue supplemental oxygen during the nighttime. ? ?Pulmonary nodules ?H/O lung carcinoid s/p resection ?Follow-up CT reviewed with stable lung nodules. ? ?History of anal cancer ?Surveillance CTs per  oncology ? ?Plan/Recommendations: ?- Continue brovana, pulmicort, add Yupelri,  ?- Duonebs as needed ? ?Follow-up in 1 year ? ?Marshell Garfinkel MD ?[facility] ?11/12/2021, 11:02 AM ? ?CC: Marrian Salvage,* ? ? ?

## 2021-11-12 NOTE — Patient Instructions (Signed)
I am glad you are stable with your breathing.  We will renew your nebulizers and follow-up in 1 year. ?

## 2021-11-26 ENCOUNTER — Encounter: Payer: Self-pay | Admitting: Family

## 2021-11-27 ENCOUNTER — Other Ambulatory Visit: Payer: Self-pay | Admitting: Family

## 2021-11-27 MED ORDER — NITROFURANTOIN MONOHYD MACRO 100 MG PO CAPS: 100.0000 mg | ORAL_CAPSULE | Freq: Two times a day (BID) | ORAL | 0 refills | Status: DC

## 2021-12-19 ENCOUNTER — Other Ambulatory Visit: Payer: Self-pay | Admitting: Family

## 2021-12-27 ENCOUNTER — Telehealth: Payer: Self-pay | Admitting: Hematology and Oncology

## 2021-12-27 NOTE — Telephone Encounter (Signed)
Rescheduled appointment per provider PAL. Talked with the patients daughter Collie Siad and she is aware of the changes made to the patients appointment.

## 2022-01-15 ENCOUNTER — Ambulatory Visit (INDEPENDENT_AMBULATORY_CARE_PROVIDER_SITE_OTHER): Payer: Medicare Other

## 2022-01-15 DIAGNOSIS — I495 Sick sinus syndrome: Secondary | ICD-10-CM | POA: Diagnosis not present

## 2022-01-15 LAB — CUP PACEART REMOTE DEVICE CHECK
Battery Remaining Longevity: 68 mo
Battery Remaining Percentage: 66 %
Battery Voltage: 2.98 V
Brady Statistic AP VP Percent: 2.4 %
Brady Statistic AP VS Percent: 67 %
Brady Statistic AS VP Percent: 1.1 %
Brady Statistic AS VS Percent: 29 %
Brady Statistic RA Percent Paced: 54 %
Brady Statistic RV Percent Paced: 5.9 %
Date Time Interrogation Session: 20230606020014
Implantable Lead Implant Date: 20190909
Implantable Lead Implant Date: 20190909
Implantable Lead Location: 753859
Implantable Lead Location: 753860
Implantable Lead Model: 3830
Implantable Pulse Generator Implant Date: 20190909
Lead Channel Impedance Value: 340 Ohm
Lead Channel Impedance Value: 440 Ohm
Lead Channel Pacing Threshold Amplitude: 0.5 V
Lead Channel Pacing Threshold Amplitude: 0.5 V
Lead Channel Pacing Threshold Pulse Width: 0.5 ms
Lead Channel Pacing Threshold Pulse Width: 1 ms
Lead Channel Sensing Intrinsic Amplitude: 1.5 mV
Lead Channel Sensing Intrinsic Amplitude: 2.4 mV
Lead Channel Setting Pacing Amplitude: 1.5 V
Lead Channel Setting Pacing Amplitude: 2.5 V
Lead Channel Setting Pacing Pulse Width: 1 ms
Lead Channel Setting Sensing Sensitivity: 0.7 mV
Pulse Gen Model: 2272
Pulse Gen Serial Number: 9061801

## 2022-01-21 ENCOUNTER — Ambulatory Visit: Payer: Medicare Other | Admitting: Hematology and Oncology

## 2022-01-29 ENCOUNTER — Other Ambulatory Visit: Payer: Self-pay | Admitting: Family

## 2022-01-30 NOTE — Progress Notes (Signed)
Remote pacemaker transmission.   

## 2022-02-04 NOTE — Progress Notes (Signed)
Patient Care Team: Marrian Salvage, [place] as PCP - General (Internal Medicine)  DIAGNOSIS:  Encounter Diagnosis  Name Primary?   Anal cancer ([place])     SUMMARY OF ONCOLOGIC HISTORY: Oncology History  Lung cancer ([place])  Anal cancer ([place])  07/25/2016 Initial Diagnosis   Perianal: Squamous cell cancer atleast 5 cm, Moderately differentiated T2N1 (lung nodules, inguinal LN unclear etiology) stage 3B vs Stage 4   08/13/2016 -  Radiation Therapy   Anal Radiation (diverting colostomy)     CHIEF COMPLIANT:  Annual follow-up of perianal squamous cell carcinoma   INTERVAL HISTORY: Tricia Hernandez is a 87 y.o. with above-mentioned history of perianal squamous cell carcinoma that was treated with annual radiation. She presents to the clinic today for a follow-up. States that she a little bit of discharge more mucous in it last month.  She is able to walk around with the help of a cane.  She is accompanied today by her daughter.   ALLERGIES:  is allergic to penicillins; aleve [naproxen]; aleve [naproxen sodium]; antihistamines, chlorpheniramine-type; aspirin; benadryl [diphenhydramine hcl]; codeine; hydrochlorothiazide; rocephin [ceftriaxone sodium in dextrose]; and sulfa antibiotics.  MEDICATIONS:  Current Outpatient Medications  Medication Sig Dispense Refill   acetaminophen (TYLENOL) 325 MG tablet Take 650 mg by mouth daily as needed for moderate pain or headache.      anti-nausea (EMETROL) solution Take 10 mLs by mouth every 15 (fifteen) minutes as needed for nausea or vomiting.     arformoterol (BROVANA) 15 MCG/2ML NEBU INHALE CONTENTS OF 1 VIAL(2MLS) VIA NEBULIZER TWICE DAILY 120 mL 11   atorvastatin (LIPITOR) 20 MG tablet TAKE 1 TABLET(20 MG) BY MOUTH AT BEDTIME 90 tablet 3   budesonide (PULMICORT) 0.5 MG/2ML nebulizer solution Use 1 vial via nebulizer twice daily 360 mL 11   Calcium 600-200 MG-UNIT tablet Take 1 tablet by mouth at bedtime.      dofetilide (TIKOSYN) 500 MCG  capsule TAKE ONE CAPSULE BY MOUTH TWICE DAILY 180 capsule 3   ferrous sulfate 325 (65 FE) MG EC tablet Take 325 mg by mouth 2 (two) times daily.     fexofenadine (ALLEGRA) 180 MG tablet Take 1 tablet (180 mg total) by mouth daily. 90 tablet 3   fluticasone (FLONASE) 50 MCG/ACT nasal spray Place 1 spray into both nostrils daily.     furosemide (LASIX) 20 MG tablet Take 1 tablet (20 mg total) by mouth as needed. 90 tablet 3   ipratropium-albuterol (DUONEB) 0.5-2.5 (3) MG/3ML SOLN Take 3 mLs by nebulization 2 (two) times daily. 540 mL 3   MAGNESIUM-OXIDE 400 (240 Mg) MG tablet TAKE 1 TABLET BY MOUTH DAILY 90 tablet 3   nitrofurantoin, macrocrystal-monohydrate, (MACROBID) 100 MG capsule Take 1 capsule (100 mg total) by mouth 2 (two) times daily. 14 capsule 0   Ostomy Supplies KIT 8514 is number on bag one piece unit. Colostomy bag and adhesive and alcohol prep, no sting, cannot use regular prep pad. 30 each 11   PARoxetine (PAXIL) 40 MG tablet TAKE 1/2 TABLET(20 MG) BY MOUTH TWICE DAILY 90 tablet 3   revefenacin (YUPELRI) 175 MCG/3ML nebulizer solution Take 3 mLs (175 mcg total) by nebulization daily. 90 mL 11   vitamin B-12 (CYANOCOBALAMIN) 1000 MCG tablet Take 1,000 mcg by mouth at bedtime.      Vitamin D, Ergocalciferol, (DRISDOL) 1.25 MG (50000 UNIT) CAPS capsule TAKE 1 CAPSULE BY MOUTH 1 TIME A WEEK 12 capsule 1   Vitamin D, Ergocalciferol, (DRISDOL) 1.25 MG (50000 UNIT) CAPS  capsule TAKE 1 CAPSULE BY MOUTH 1 TIME A WEEK 12 capsule 0   No current facility-administered medications for this visit.   Facility-Administered Medications Ordered in Other Visits  Medication Dose Route Frequency Provider Last Rate Last Admin   sodium phosphate (FLEET) 7-19 GM/118ML enema 2 enema  2 enema Rectal Once Ileana Roup, MD        PHYSICAL EXAMINATION: ECOG PERFORMANCE STATUS: 1 - Symptomatic but completely ambulatory  Vitals:   02/18/22 1109 02/18/22 1140  BP: (!) 163/56 140/80  Pulse: 61    Resp: 18   Temp: 97.6 F (36.4 C)   SpO2: 92%    Filed Weights   02/18/22 1109  Weight: 237 lb 1.6 oz (107.5 kg)      LABORATORY DATA:  I have reviewed the data as listed    Latest Ref Rng & Units 02/18/2022   10:11 AM 11/06/2021   10:55 AM 01/23/2021   10:42 AM  CMP  Glucose 70 - 99 mg/dL 93  91  89   BUN 8 - 23 mg/dL 21  19  15    Creatinine 0.44 - 1.00 mg/dL 0.91  0.91  0.82   Sodium 135 - 145 mmol/L 142  142  145   Potassium 3.5 - 5.1 mmol/L 5.3  4.6  4.7   Chloride 98 - 111 mmol/L 105  102  105   CO2 22 - 32 mmol/L 33  33  31   Calcium 8.9 - 10.3 mg/dL 9.2  9.2  8.9   Total Protein 6.5 - 8.1 g/dL 6.4  6.0  6.1   Total Bilirubin 0.3 - 1.2 mg/dL 0.4  0.4  0.5   Alkaline Phos 38 - 126 U/L 85  102  83   AST 15 - 41 U/L 20  20  24    ALT 0 - 44 U/L 14  14  17      Lab Results  Component Value Date   WBC 6.0 02/18/2022   HGB 10.9 (L) 02/18/2022   HCT 35.2 (L) 02/18/2022   MCV 85.6 02/18/2022   PLT 151 02/18/2022   NEUTROABS 4.3 02/18/2022    ASSESSMENT & PLAN:  Anal cancer ([place]) Squamous cell carcinoma of the anus: Patient had bilateral inguinal lymphadenopathy and bilateral lung nodules. It was unclear but the inguinal lymphadenopathy and the lung nodules could be metastatic disease. it could be a stage IIIa versus stage IV.  (Given her age we did not pursue biopsies of the lung)   Treatment summary 08/13/2016-09/25/2016 : XRT to anus S/P diverting colostomy   CT CAP:12/30/2016: Nonspecific wall thickening posteriorly lower left rectum inguinal adenopathy decreased stable bilateral lung nodules favor benign   Severe anemia from GI bleeding: Stopped after discontinuing anticoagulation CT CAP 02/08/2019: Stable pulmonary nodules no evidence of metastatic disease. Blood work was reviewed.  I do not recommend routine scans anymore at this time.  Patient is asymptomatic.   Mild anemia: Anemia due to chronic disease. Frequent falls and mobility issues: using a cane to  get around. scans 02/01/20: Stable exam, and stable pulmonary nodules bilaterally, left lower quadrant sigmoid end colostomy   Return to clinic in 1 year for follow-up with labs    No orders of the defined types were placed in this encounter.  The patient has a good understanding of the overall plan. she agrees with it. she will call with any problems that may develop before the next visit here. Total time spent: 30 mins including face to face  time and time spent for planning, charting and co-ordination of care   Harriette Ohara, MD 02/18/22    I Gardiner Coins am scribing for Dr. Lindi Adie  I have reviewed the above documentation for accuracy and completeness, and I agree with the above.

## 2022-02-18 ENCOUNTER — Other Ambulatory Visit: Payer: Self-pay

## 2022-02-18 ENCOUNTER — Inpatient Hospital Stay: Payer: Medicare Other | Attending: Hematology and Oncology | Admitting: Hematology and Oncology

## 2022-02-18 ENCOUNTER — Inpatient Hospital Stay: Payer: Medicare Other

## 2022-02-18 DIAGNOSIS — Z79899 Other long term (current) drug therapy: Secondary | ICD-10-CM | POA: Diagnosis not present

## 2022-02-18 DIAGNOSIS — Z882 Allergy status to sulfonamides status: Secondary | ICD-10-CM | POA: Insufficient documentation

## 2022-02-18 DIAGNOSIS — C3490 Malignant neoplasm of unspecified part of unspecified bronchus or lung: Secondary | ICD-10-CM

## 2022-02-18 DIAGNOSIS — Z880 Allergy status to penicillin: Secondary | ICD-10-CM | POA: Insufficient documentation

## 2022-02-18 DIAGNOSIS — Z881 Allergy status to other antibiotic agents status: Secondary | ICD-10-CM | POA: Insufficient documentation

## 2022-02-18 DIAGNOSIS — Z886 Allergy status to analgesic agent status: Secondary | ICD-10-CM | POA: Insufficient documentation

## 2022-02-18 DIAGNOSIS — C210 Malignant neoplasm of anus, unspecified: Secondary | ICD-10-CM | POA: Diagnosis present

## 2022-02-18 DIAGNOSIS — Z933 Colostomy status: Secondary | ICD-10-CM | POA: Insufficient documentation

## 2022-02-18 DIAGNOSIS — D638 Anemia in other chronic diseases classified elsewhere: Secondary | ICD-10-CM | POA: Insufficient documentation

## 2022-02-18 DIAGNOSIS — Z888 Allergy status to other drugs, medicaments and biological substances status: Secondary | ICD-10-CM | POA: Diagnosis not present

## 2022-02-18 DIAGNOSIS — Z885 Allergy status to narcotic agent status: Secondary | ICD-10-CM | POA: Insufficient documentation

## 2022-02-18 LAB — CBC WITH DIFFERENTIAL (CANCER CENTER ONLY)
Abs Immature Granulocytes: 0.01 10*3/uL (ref 0.00–0.07)
Basophils Absolute: 0 10*3/uL (ref 0.0–0.1)
Basophils Relative: 0 %
Eosinophils Absolute: 0.2 10*3/uL (ref 0.0–0.5)
Eosinophils Relative: 3 %
HCT: 35.2 % — ABNORMAL LOW (ref 36.0–46.0)
Hemoglobin: 10.9 g/dL — ABNORMAL LOW (ref 12.0–15.0)
Immature Granulocytes: 0 %
Lymphocytes Relative: 18 %
Lymphs Abs: 1.1 10*3/uL (ref 0.7–4.0)
MCH: 26.5 pg (ref 26.0–34.0)
MCHC: 31 g/dL (ref 30.0–36.0)
MCV: 85.6 fL (ref 80.0–100.0)
Monocytes Absolute: 0.4 10*3/uL (ref 0.1–1.0)
Monocytes Relative: 6 %
Neutro Abs: 4.3 10*3/uL (ref 1.7–7.7)
Neutrophils Relative %: 73 %
Platelet Count: 151 10*3/uL (ref 150–400)
RBC: 4.11 MIL/uL (ref 3.87–5.11)
RDW: 16.9 % — ABNORMAL HIGH (ref 11.5–15.5)
WBC Count: 6 10*3/uL (ref 4.0–10.5)
nRBC: 0 % (ref 0.0–0.2)

## 2022-02-18 LAB — CMP (CANCER CENTER ONLY)
ALT: 14 U/L (ref 0–44)
AST: 20 U/L (ref 15–41)
Albumin: 4 g/dL (ref 3.5–5.0)
Alkaline Phosphatase: 85 U/L (ref 38–126)
Anion gap: 4 — ABNORMAL LOW (ref 5–15)
BUN: 21 mg/dL (ref 8–23)
CO2: 33 mmol/L — ABNORMAL HIGH (ref 22–32)
Calcium: 9.2 mg/dL (ref 8.9–10.3)
Chloride: 105 mmol/L (ref 98–111)
Creatinine: 0.91 mg/dL (ref 0.44–1.00)
GFR, Estimated: >60 mL/min (ref >60)
Glucose, Bld: 93 mg/dL (ref 70–99)
Potassium: 5.3 mmol/L — ABNORMAL HIGH (ref 3.5–5.1)
Sodium: 142 mmol/L (ref 135–145)
Total Bilirubin: 0.4 mg/dL (ref 0.3–1.2)
Total Protein: 6.4 g/dL — ABNORMAL LOW (ref 6.5–8.1)

## 2022-02-18 NOTE — Assessment & Plan Note (Signed)
Squamous cell carcinoma of the anus: Patient had bilateral inguinal lymphadenopathy and bilateral lung nodules. It was unclear but the inguinal lymphadenopathy and the lung nodulescould be metastatic disease. it could be a stage IIIa versus stage IV.  (Given her age we did not pursue biopsies of the lung)  Treatmentsummary 08/13/2016-09/25/2016: XRT to anus S/P diverting colostomy  CT CAP:12/30/2016: Nonspecific wall thickening posteriorly lower left rectum inguinal adenopathy decreased stable bilateral lung nodules favor benign  Severe anemia from GI bleeding:Stopped after discontinuing anticoagulation CT CAP 02/08/2019:Stable pulmonary nodules no evidence of metastatic disease. Blood work was reviewed.  I do not recommend routine scans anymore at this time.  Patient is asymptomatic.  Mild anemia: Anemia due to chronic disease. Frequent falls and mobility issues and difficulty with sleeping lying down: I sent a prescription for hospital bed. scans 02/01/20: Stable exam, and stable pulmonary nodules bilaterally, left lower quadrant sigmoid end colostomy  Return to clinic in 1 year for follow-upwith labs

## 2022-02-20 ENCOUNTER — Telehealth: Payer: Self-pay | Admitting: Family

## 2022-02-20 NOTE — Telephone Encounter (Signed)
Left message for patient to call back and schedule Medicare Annual Wellness Visit (AWV).   Please offer to do virtually or by telephone.  Left office number and my jabber #[phone].  AWVI eligible as of  08/12/2009  Please schedule at anytime with Nurse Health Advisor.   

## 2022-02-26 ENCOUNTER — Other Ambulatory Visit: Payer: Self-pay | Admitting: Hematology and Oncology

## 2022-02-26 ENCOUNTER — Ambulatory Visit (INDEPENDENT_AMBULATORY_CARE_PROVIDER_SITE_OTHER): Payer: Medicare Other

## 2022-02-26 ENCOUNTER — Ambulatory Visit: Payer: Medicare Other

## 2022-02-26 DIAGNOSIS — Z0000 Encounter for general adult medical examination without abnormal findings: Secondary | ICD-10-CM | POA: Diagnosis not present

## 2022-02-26 NOTE — Progress Notes (Addendum)
Subjective:   Tricia Hernandez is a 88 y.o. female who presents for an Initial Medicare Annual Wellness Visit.  Review of Systems     Cardiac Risk Factors include: advanced age (>39mn, >>57women);hypertension;obesity (BMI >30kg/m2);dyslipidemia     Objective:    There were no vitals filed for this visit. There is no height or weight on file to calculate BMI.     02/26/2022    3:03 PM 02/18/2022   11:35 AM 08/17/2018    9:05 AM 04/20/2018    9:38 PM 04/20/2018    8:00 AM 02/24/2018    2:24 PM 06/16/2017    9:26 AM  Advanced Directives  Does Patient Have a Medical Advance Directive? Yes No No No No No No  Type of [place]amed[place]of [place] will        [place]? No - copy requested        Would patient like information on creating a medical advance directive? No - Patient declined No - Patient declined Yes (ED - Information included in AVS) No - Patient declined   No - Patient declined    Current Medications (verified) Outpatient Encounter Medications as of 02/26/2022  Medication Sig   acetaminophen (TYLENOL) 325 MG tablet Take 650 mg by mouth daily as needed for moderate pain or headache.    anti-nausea (EMETROL) solution Take 10 mLs by mouth every 15 (fifteen) minutes as needed for nausea or vomiting.   arformoterol (BROVANA) 15 MCG/2ML NEBU INHALE CONTENTS OF 1 VIAL(2MLS) VIA NEBULIZER TWICE DAILY   atorvastatin (LIPITOR) 20 MG tablet TAKE 1 TABLET(20 MG) BY MOUTH AT BEDTIME   budesonide (PULMICORT) 0.5 MG/2ML nebulizer solution Use 1 vial via nebulizer twice daily   Calcium 600-200 MG-UNIT tablet Take 1 tablet by mouth at bedtime.    dofetilide (TIKOSYN) 500 MCG capsule TAKE ONE CAPSULE BY MOUTH TWICE DAILY   ferrous sulfate 325 (65 FE) MG EC tablet Take 325 mg by mouth 2 (two) times daily.   fexofenadine (ALLEGRA) 180 MG tablet Take 1 tablet (180 mg total) by mouth daily.   fluticasone (FLONASE) 50 MCG/ACT nasal spray  Place 1 spray into both nostrils daily.   furosemide (LASIX) 20 MG tablet Take 1 tablet (20 mg total) by mouth as needed.   ipratropium-albuterol (DUONEB) 0.5-2.5 (3) MG/3ML SOLN Take 3 mLs by nebulization 2 (two) times daily.   MAGNESIUM-OXIDE 400 (240 Mg) MG tablet TAKE 1 TABLET BY MOUTH DAILY   nitrofurantoin, macrocrystal-monohydrate, (MACROBID) 100 MG capsule Take 1 capsule (100 mg total) by mouth 2 (two) times daily.   Ostomy Supplies KIT 8514 is number on bag one piece unit. Colostomy bag and adhesive and alcohol prep, no sting, cannot use regular prep pad.   PARoxetine (PAXIL) 40 MG tablet TAKE 1/2 TABLET(20 MG) BY MOUTH TWICE DAILY   revefenacin (YUPELRI) 175 MCG/3ML nebulizer solution Take 3 mLs (175 mcg total) by nebulization daily.   vitamin B-12 (CYANOCOBALAMIN) 1000 MCG tablet Take 1,000 mcg by mouth at bedtime.    Vitamin D, Ergocalciferol, (DRISDOL) 1.25 MG (50000 UNIT) CAPS capsule TAKE 1 CAPSULE BY MOUTH 1 TIME A WEEK   Vitamin D, Ergocalciferol, (DRISDOL) 1.25 MG (50000 UNIT) CAPS capsule TAKE 1 CAPSULE BY MOUTH 1 TIME A WEEK   Facility-Administered Encounter Medications as of 02/26/2022  Medication   sodium phosphate (FLEET) 7-19 GM/118ML enema 2 enema    Allergies (verified) Penicillins; Aleve [naproxen]; Aleve [naproxen sodium]; Antihistamines, chlorpheniramine-type;  Aspirin; Benadryl [diphenhydramine hcl]; Codeine; Hydrochlorothiazide; Rocephin [ceftriaxone sodium in dextrose]; and Sulfa antibiotics   History: Past Medical History:  Diagnosis Date   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 5/2 EGD: grade A reflux esophagitis.  Mild, non-hemorrhagic gastritis.  Ablation of 3 Duodenal AVMs   Herpes zoster    History of blood transfusion    for vaginal, uterine bleeding leading to hysterectomy.  in 12/2016 transfused x 1 for GI bleed.    History of hiatal hernia    Hyperlipidemia    Hypertension    Labyrinthitis    Lung cancer ([place])    RML   Obesity    On home oxygen therapy    "3L just at night when I sleep" (04/20/2018)   Paroxysmal supraventricular tachycardia (HCC)    Peptic ulcer of stomach    hx   Pneumonia 1980s X 1; 08/2016   "walking; double"   Presence of permanent cardiac pacemaker 04/20/2018   Pulmonary embolism ([place]) 05/2014   "left lung"   Seasonal allergies    "bad" (04/20/2018)   Vitamin D deficiency    Past Surgical History:  Procedure Laterality Date   APPENDECTOMY  1959   BASAL CELL CARCINOMA EXCISION     nose x 2   BREAST CYST ASPIRATION Right 2016?   CATARACT EXTRACTION W/ INTRAOCULAR LENS  IMPLANT, BILATERAL Bilateral 1990s   CHOLECYSTECTOMY OPEN  1980s   COLON SURGERY     COLOSTOMY N/A 07/29/2016   Procedure: COLOSTOMY;  Surgeon: Clovis Riley, MD;  Location: [facility];  Service: General;  Laterality: N/A;   DILATION AND CURETTAGE OF UTERUS     ESOPHAGOGASTRODUODENOSCOPY N/A 12/11/2016   Procedure: ESOPHAGOGASTRODUODENOSCOPY (EGD);  Surgeon: Jerene Bears, MD;  Location[facility] ENDOSCOPY;  Service: Endoscopy;  Laterality: N/A;   EVALUATION UNDER ANESTHESIA WITH HEMORRHOIDECTOMY AND PROCTOSCOPY N/A 07/25/2016   Procedure: EXAM UNDER ANESTHESIA, EXCISION PERIANAL MASS.;  Surgeon: Judeth Horn, MD;  Location: [place];  Service: General;  Laterality: N/A;  Prone position   FLEXIBLE SIGMOIDOSCOPY N/A 06/16/2017   Procedure: FLEXIBLE SIGMOIDOSCOPY EXAM UNDER ANESHESIA;  Surgeon: Ileana Roup, MD;  Location: [facility];  Service: General;  Laterality: N/A;   INSERT / REPLACE / REMOVE PACEMAKER   04/20/2018   IR RADIOLOGIST EVAL & MGMT  03/25/2017   LAPAROSCOPIC DIVERTED COLOSTOMY N/A 07/29/2016   Procedure: ATTEMPTED LAPAROSCOPIC ASSISTED COLOSTOMY;  Surgeon: Clovis Riley, MD;  Location: [facility];  Service: General;  Laterality: N/A;   LUNG REMOVAL, PARTIAL Right 2013   RML mass/carcinoid, Dr Lurena Nida   PACEMAKER IMPLANT N/A 04/20/2018   Procedure: PACEMAKER IMPLANT;  Surgeon: Evans Lance, MD;  Location: [facility];  Service: Cardiovascular;  Laterality: N/A;   SQUAMOUS CELL CARCINOMA EXCISION     rectum, right leg   TONSILLECTOMY AND ADENOIDECTOMY  1960s   VAGINAL HYSTERECTOMY  1959   "partial"   Family History  Problem Relation Age  of Onset   Heart attack Mother        Dec 1987   CVA Mother    Hypertension Mother    Multiple myeloma Mother    Breast cancer Sister    Skin cancer Sister    Prostate cancer Brother    Throat cancer Brother    Cervical cancer Daughter    Leukemia Daughter    Leukemia Son    Throat cancer Brother    Cancer Brother        soft palette   Heart Problems Brother        Pacemaker   Colon cancer Neg Hx    Pancreatic cancer Neg Hx    Social History   Socioeconomic History   Marital status: Widowed    Spouse name: Not on file   Number of children: 5   Years of education: Not on file   Highest education level: Not on file  Occupational History   Occupation: retired  Tobacco Use   Smoking status: Former    Packs/day: 0.00    Types: Cigarettes   Smokeless tobacco: Never   Tobacco comments:    04/20/2018 "smoked once in awhile in the 1960s; not more that 4 packs total in my whole life"  Vaping Use   Vaping Use: Never used  Substance and Sexual Activity   Alcohol use: Never   Drug use: Never   Sexual activity: Not Currently  Other Topics Concern   Not on file  Social History Narrative   Patient is widowed and retired she has 5 children, she lives alone and performs all of her ADLs   Former smoker, never alcohol never drug  use   Social Determinants of Radio broadcast assistant Strain: Not on file  Food Insecurity: Not on file  Transportation Needs: Not on file  Physical Activity: Not on file  Stress: Not on file  Social Connections: Not on file    Tobacco Counseling Counseling given: Not Answered Tobacco comments: 04/20/2018 "smoked once in awhile in the 1960s; not more that 4 packs total in my whole life"   Clinical Intake:  Pre-visit preparation completed: Yes  Pain : No/denies pain     BMI - recorded: 42.09 Nutritional Status: BMI > 30  Obese Nutritional Risks: None Diabetes: No  How often do you need to have someone help you when you read instructions, pamphlets, or other written materials from your doctor or pharmacy?: 1 - Never  Diabetic?no  Interpreter Needed?: No  Information entered by :: [facility]    02/26/2022    3:06 PM  In your present state of health, do you have any difficulty performing the following activities:  Hearing? 1  Vision? 0  Difficulty concentrating or making decisions? 1  Walking or climbing stairs? 1  Dressing or bathing? 0  Doing errands, shopping? 1  Preparing Food and eating ? N  Using the Toilet? N  In the past six months, have you accidently leaked urine? Y  Do you have problems with loss of bowel control? N  Managing your Medications? N  Managing your Finances? N  Housekeeping or managing your Housekeeping? N    Patient Care Team: Marrian Salvage, FNP as PCP - General (Internal Medicine)  Indicate any recent Medical Services you may have received from other than [facility] providers in the past year (date may be approximate).     Assessment:   This is a routine wellness examination  for [place].  Hearing/Vision screen No results found.  Dietary issues and exercise activities discussed: Current Exercise Habits: The patient does not participate in regular exercise at present, Exercise limited by:  orthopedic condition(s)   Goals Addressed   None    Depression Screen    02/26/2022    3:04 PM 11/06/2021   10:15 AM 05/27/2017    1:14 PM 02/25/2017    4:08 PM 11/04/2016    1:20 PM 10/29/2016    2:08 PM  PHQ 2/9 Scores  PHQ - 2 Score 0 0 0 0 0 0    Fall Risk    02/26/2022    3:03 PM 11/06/2021   10:15 AM 10/31/2020   11:10 AM 05/27/2017    1:14 PM 02/25/2017    4:08 PM  Fall Risk   Falls in the past year? 0 0 0 Yes No  Number falls in past yr: 0 0 0 1   Injury with Fall? 0 0 0 No   Risk for fall due to : No Fall Risks      Follow up Falls evaluation completed  Falls evaluation completed      [place]:  Any stairs in or around the home? Yes  If so, are there any without handrails? No  Home free of loose throw rugs in walkways, pet beds, electrical cords, etc? Yes  Adequate lighting in your home to reduce risk of falls? Yes   ASSISTIVE DEVICES UTILIZED TO PREVENT FALLS:  Life alert? No  Use of a cane, walker or w/c? Yes  Grab bars in the bathroom? Yes  Shower chair or bench in shower? Yes  Elevated toilet seat or a handicapped toilet? Yes   TIMED UP AND GO:  Was the test performed? No .    Cognitive Function:        02/26/2022    3:11 PM  6CIT Screen  What Year? 0 points  What month? 0 points  What time? 0 points  Count back from 20 0 points  Months in reverse 0 points  Repeat phrase 2 points  Total Score 2 points    Immunizations Immunization History  Administered Date(s) Administered   Fluad Quad(high Dose 65+) 04/26/2019   Influenza, High Dose Seasonal PF 05/27/2017, 04/21/2018, 05/08/2020   Influenza-Unspecified 06/06/2021   Moderna Sars-Covid-2 Vaccination 10/06/2019, 11/03/2019   Pneumococcal Conjugate-13 09/21/2013   Pneumococcal Polysaccharide-23 09/30/2011   Tdap 05/27/2017   Zoster Recombinat (Shingrix) 05/29/2017, 08/30/2017    TDAP status: Up to date  Flu Vaccine status: Up to  date  Pneumococcal vaccine status: Up to date  Covid-19 vaccine status: Information provided on how to obtain vaccines.   Qualifies for Shingles Vaccine? Yes   Zostavax completed No   Shingrix Completed?: Yes  Screening Tests Health Maintenance  Topic Date Due   COVID-19 Vaccine (3 - Moderna risk series) 12/01/2019   INFLUENZA VACCINE  03/12/2022   TETANUS/TDAP  05/28/2027   Pneumonia Vaccine 68+ Years old  Completed   Zoster Vaccines- Shingrix  Completed   HPV VACCINES  Aged Out   DEXA SCAN  Discontinued    Health Maintenance  Health Maintenance Due  Topic Date Due   COVID-19 Vaccine (3 - Moderna risk series) 12/01/2019    Colorectal cancer screening: No longer required.   Mammogram status: No longer required due to age.  Bone Density status: Ordered declined. Pt provided with contact info and advised to call to schedule appt.  Lung Cancer  Screening: (Low Dose CT Chest recommended if Age 80-80 years, 30 pack-year currently smoking OR have quit w/in 15years.) does not qualify.   Lung Cancer Screening Referral: n/a  Additional Screening:  Hepatitis C Screening: does not qualify; Completed aged out  Vision Screening: Recommended annual ophthalmology exams for early detection of glaucoma and other disorders of the eye. Is the patient up to date with their annual eye exam?  Yes  Who is the provider or what is the name of the office in which the patient attends annual eye exams? Dr. Mikki Santee If pt is not established with a provider, would they like to be referred to a provider to establish care? No .   Dental Screening: Recommended annual dental exams for proper oral hygiene  Community Resource Referral / Chronic Care Management: CRR required this visit?  No   CCM required this visit?  No      Plan:     I have personally reviewed and noted the following in the patient's chart:   Medical and social history Use of alcohol, tobacco or illicit drugs  Current  medications and supplements including opioid prescriptions. Patient is not currently taking opioid prescriptions. Functional ability and status Nutritional status Physical activity Advanced directives List of other physicians Hospitalizations, surgeries, and ER visits in previous 12 months Vitals Screenings to include cognitive, depression, and falls Referrals and appointments  In addition, I have reviewed and discussed with patient certain preventive protocols, quality metrics, and best practice recommendations. A written personalized care plan for preventive services as well as general preventive health recommendations were provided to patient.     Duard Brady Sanjuanita Condrey, [place]   02/26/2022   Nurse Notes: none   Medical screening examination/treatment/procedure(s) were performed by non-physician practitioner and as supervising provider I was immediately available for consultation/collaboration.  I agree with above. Marrian Salvage, FNP

## 2022-02-26 NOTE — Patient Instructions (Signed)
Tricia Hernandez , Thank you for taking time to come for your Medicare Wellness Visit. I appreciate your ongoing commitment to your health goals. Please review the following plan we discussed and let me know if I can assist you in the future.   Screening recommendations/referrals: Colonoscopy: no longer needed Mammogram: no longer needed Bone Density: declined Recommended yearly ophthalmology/optometry visit for glaucoma screening and checkup Recommended yearly dental visit for hygiene and checkup  Vaccinations: Influenza vaccine: up to date Pneumococcal vaccine: up to date Tdap vaccine: up to date Shingles vaccine: up to date   Covid-19:Due-May obtain vaccine at your local pharmacy.   Advanced directives: yes, not on file  Conditions/risks identified: see problem list   Next appointment: Follow up in one year for your annual wellness visit    Preventive Care 65 Years and Older, Female Preventive care refers to lifestyle choices and visits with your health care provider that can promote health and wellness. What does preventive care include? A yearly physical exam. This is also called an annual well check. Dental exams once or twice a year. Routine eye exams. Ask your health care provider how often you should have your eyes checked. Personal lifestyle choices, including: Daily care of your teeth and gums. Regular physical activity. Eating a healthy diet. Avoiding tobacco and drug use. Limiting alcohol use. Practicing safe sex. Taking low-dose aspirin every day. Taking vitamin and mineral supplements as recommended by your health care provider. What happens during an annual well check? The services and screenings done by your health care provider during your annual well check will depend on your age, overall health, lifestyle risk factors, and family history of disease. Counseling  Your health care provider may ask you questions about your: Alcohol use. Tobacco use. Drug  use. Emotional well-being. Home and relationship well-being. Sexual activity. Eating habits. History of falls. Memory and ability to understand (cognition). Work and work Statistician. Reproductive health. Screening  You may have the following tests or measurements: Height, weight, and BMI. Blood pressure. Lipid and cholesterol levels. These may be checked every 5 years, or more frequently if you are over 77 years old. Skin check. Lung cancer screening. You may have this screening every year starting at age 86 if you have a 30-pack-year history of smoking and currently smoke or have quit within the past 15 years. Fecal occult blood test (FOBT) of the stool. You may have this test every year starting at age 86. Flexible sigmoidoscopy or colonoscopy. You may have a sigmoidoscopy every 5 years or a colonoscopy every 10 years starting at age 86. Hepatitis C blood test. Hepatitis B blood test. Sexually transmitted disease (STD) testing. Diabetes screening. This is done by checking your blood sugar (glucose) after you have not eaten for a while (fasting). You may have this done every 1-3 years. Bone density scan. This is done to screen for osteoporosis. You may have this done starting at age 86. Mammogram. This may be done every 1-2 years. Talk to your health care provider about how often you should have regular mammograms. Talk with your health care provider about your test results, treatment options, and if necessary, the need for more tests. Vaccines  Your health care provider may recommend certain vaccines, such as: Influenza vaccine. This is recommended every year. Tetanus, diphtheria, and acellular pertussis (Tdap, Td) vaccine. You may need a Td booster every 10 years. Zoster vaccine. You may need this after age 46. Pneumococcal 13-valent conjugate (PCV13) vaccine. One dose is recommended after  age 86. Pneumococcal polysaccharide (PPSV23) vaccine. One dose is recommended after age  86. Talk to your health care provider about which screenings and vaccines you need and how often you need them. This information is not intended to replace advice given to you by your health care provider. Make sure you discuss any questions you have with your health care provider. Document Released: 08/25/2015 Document Revised: 04/17/2016 Document Reviewed: 05/30/2015 Elsevier Interactive Patient Education  2017 [place] Prevention in the Home Falls can cause injuries. They can happen to people of all ages. There are many things you can do to make your home safe and to help prevent falls. What can I do on the outside of my home? Regularly fix the edges of walkways and driveways and fix any cracks. Remove anything that might make you trip as you walk through a door, such as a raised step or threshold. Trim any bushes or trees on the path to your home. Use bright outdoor lighting. Clear any walking paths of anything that might make someone trip, such as rocks or tools. Regularly check to see if handrails are loose or broken. Make sure that both sides of any steps have handrails. Any raised decks and porches should have guardrails on the edges. Have any leaves, snow, or ice cleared regularly. Use sand or salt on walking paths during winter. Clean up any spills in your garage right away. This includes oil or grease spills. What can I do in the bathroom? Use night lights. Install grab bars by the toilet and in the tub and shower. Do not use towel bars as grab bars. Use non-skid mats or decals in the tub or shower. If you need to sit down in the shower, use a plastic, non-slip stool. Keep the floor dry. Clean up any water that spills on the floor as soon as it happens. Remove soap buildup in the tub or shower regularly. Attach bath mats securely with double-sided non-slip rug tape. Do not have throw rugs and other things on the floor that can make you trip. What can I do in the  bedroom? Use night lights. Make sure that you have a light by your bed that is easy to reach. Do not use any sheets or blankets that are too big for your bed. They should not hang down onto the floor. Have a firm chair that has side arms. You can use this for support while you get dressed. Do not have throw rugs and other things on the floor that can make you trip. What can I do in the kitchen? Clean up any spills right away. Avoid walking on wet floors. Keep items that you use a lot in easy-to-reach places. If you need to reach something above you, use a strong step stool that has a grab bar. Keep electrical cords out of the way. Do not use floor polish or wax that makes floors slippery. If you must use wax, use non-skid floor wax. Do not have throw rugs and other things on the floor that can make you trip. What can I do with my stairs? Do not leave any items on the stairs. Make sure that there are handrails on both sides of the stairs and use them. Fix handrails that are broken or loose. Make sure that handrails are as long as the stairways. Check any carpeting to make sure that it is firmly attached to the stairs. Fix any carpet that is loose or worn. Avoid having throw rugs  at the top or bottom of the stairs. If you do have throw rugs, attach them to the floor with carpet tape. Make sure that you have a light switch at the top of the stairs and the bottom of the stairs. If you do not have them, ask someone to add them for you. What else can I do to help prevent falls? Wear shoes that: Do not have high heels. Have rubber bottoms. Are comfortable and fit you well. Are closed at the toe. Do not wear sandals. If you use a stepladder: Make sure that it is fully opened. Do not climb a closed stepladder. Make sure that both sides of the stepladder are locked into place. Ask someone to hold it for you, if possible. Clearly mark and make sure that you can see: Any grab bars or  handrails. First and last steps. Where the edge of each step is. Use tools that help you move around (mobility aids) if they are needed. These include: Canes. Walkers. Scooters. Crutches. Turn on the lights when you go into a dark area. Replace any light bulbs as soon as they burn out. Set up your furniture so you have a clear path. Avoid moving your furniture around. If any of your floors are uneven, fix them. If there are any pets around you, be aware of where they are. Review your medicines with your doctor. Some medicines can make you feel dizzy. This can increase your chance of falling. Ask your doctor what other things that you can do to help prevent falls. This information is not intended to replace advice given to you by your health care provider. Make sure you discuss any questions you have with your health care provider. Document Released: 05/25/2009 Document Revised: 01/04/2016 Document Reviewed: 09/02/2014 Elsevier Interactive Patient Education  2017 [employer].

## 2022-04-05 ENCOUNTER — Telehealth: Payer: Self-pay | Admitting: Internal Medicine

## 2022-04-05 NOTE — Telephone Encounter (Signed)
It is ok. GT

## 2022-04-05 NOTE — Telephone Encounter (Signed)
Pt daughter called  wanting to know if its okay if pt gets the flu and covid shot at the same time

## 2022-04-08 NOTE — Telephone Encounter (Signed)
Called patient's daughter (DPR) back about message. Per Dr. Lovena Le, It is okay to take vaccines. Patient's daughter verbalized understanding.

## 2022-04-09 ENCOUNTER — Telehealth: Payer: Self-pay | Admitting: Pulmonary Disease

## 2022-04-09 DIAGNOSIS — J4489 Other specified chronic obstructive pulmonary disease: Secondary | ICD-10-CM

## 2022-04-09 DIAGNOSIS — J449 Chronic obstructive pulmonary disease, unspecified: Secondary | ICD-10-CM

## 2022-04-09 NOTE — Telephone Encounter (Signed)
Okay to send in a DME order for nebulizer machine.

## 2022-04-09 NOTE — Telephone Encounter (Signed)
Called and spoke with patient daughter Collie Siad.  Collie Siad stated patient has been without a nebulizer treatment since last Thursday, because her machine had broke.  Collie Siad stated she called [facility] and they had told her a fax was sent to [facility] for a new nebulizer machine. I have received no fax on patient at this time. I did let Collie Siad know that we could send a new DME order today for patient to receive a new machine.   Dr. Vaughan Browner please advise on new DME order for new neb machine

## 2022-04-09 NOTE — Telephone Encounter (Signed)
DME order placed for new neb machine.  Patient daughter Collie Siad is aware.  Nothing further at this time.

## 2022-04-16 ENCOUNTER — Ambulatory Visit (INDEPENDENT_AMBULATORY_CARE_PROVIDER_SITE_OTHER): Payer: Medicare Other

## 2022-04-16 ENCOUNTER — Telehealth: Payer: Self-pay

## 2022-04-16 DIAGNOSIS — I495 Sick sinus syndrome: Secondary | ICD-10-CM | POA: Diagnosis not present

## 2022-04-16 LAB — CUP PACEART REMOTE DEVICE CHECK
Battery Remaining Longevity: 50 mo
Battery Remaining Percentage: 64 %
Battery Voltage: 2.95 V
Brady Statistic AP VP Percent: 1.9 %
Brady Statistic AP VS Percent: 73 %
Brady Statistic AS VP Percent: 1 %
Brady Statistic AS VS Percent: 24 %
Brady Statistic RA Percent Paced: 62 %
Brady Statistic RV Percent Paced: 4.6 %
Date Time Interrogation Session: 20230905020013
Implantable Lead Implant Date: 20190909
Implantable Lead Implant Date: 20190909
Implantable Lead Location: 753859
Implantable Lead Location: 753860
Implantable Lead Model: 3830
Implantable Pulse Generator Implant Date: 20190909
Lead Channel Impedance Value: 340 Ohm
Lead Channel Impedance Value: 440 Ohm
Lead Channel Pacing Threshold Amplitude: 0.5 V
Lead Channel Pacing Threshold Amplitude: 0.5 V
Lead Channel Pacing Threshold Pulse Width: 0.5 ms
Lead Channel Pacing Threshold Pulse Width: 1 ms
Lead Channel Sensing Intrinsic Amplitude: 2.1 mV
Lead Channel Sensing Intrinsic Amplitude: 2.5 mV
Lead Channel Setting Pacing Amplitude: 2.5 V
Lead Channel Setting Pacing Amplitude: 5 V
Lead Channel Setting Pacing Pulse Width: 1 ms
Lead Channel Setting Sensing Sensitivity: 0.7 mV
Pulse Gen Model: 2272
Pulse Gen Serial Number: 9061801

## 2022-04-16 NOTE — Telephone Encounter (Signed)
Outreach made to Pt's daughter.  Follow up scheduled with AT for yearly appt and to adjust outputs.  Scheduled remote reviewed. Normal device function.  AF burden 14%, decreased per AF histogram. AF log shows episodes lasting just few seconds with what appears to be both noise and PAF. Longest 1-3hrs per log. RA at high output, 5.0v with RA pacing thresholds "out of range" this past week. Presenting rhythm ApVs. On Tikosyn, no OAC due to reported history of AVM's/GI bleed.  Routing for further review  (RA lead)

## 2022-05-07 ENCOUNTER — Encounter: Payer: Self-pay | Admitting: Student

## 2022-05-07 ENCOUNTER — Ambulatory Visit: Payer: Medicare Other | Attending: Student | Admitting: Student

## 2022-05-07 VITALS — BP 132/68 | HR 74 | Ht 64.0 in | Wt 246.0 lb

## 2022-05-07 DIAGNOSIS — I495 Sick sinus syndrome: Secondary | ICD-10-CM | POA: Diagnosis present

## 2022-05-07 DIAGNOSIS — I4819 Other persistent atrial fibrillation: Secondary | ICD-10-CM | POA: Insufficient documentation

## 2022-05-07 LAB — CUP PACEART INCLINIC DEVICE CHECK
Battery Remaining Longevity: 61 mo
Battery Voltage: 2.96 V
Brady Statistic RA Percent Paced: 63 %
Brady Statistic RV Percent Paced: 4.5 %
Date Time Interrogation Session: 20230926104955
Implantable Lead Implant Date: 20190909
Implantable Lead Implant Date: 20190909
Implantable Lead Location: 753859
Implantable Lead Location: 753860
Implantable Lead Model: 3830
Implantable Pulse Generator Implant Date: 20190909
Lead Channel Impedance Value: 350 Ohm
Lead Channel Impedance Value: 437.5 Ohm
Lead Channel Pacing Threshold Amplitude: 0.5 V
Lead Channel Pacing Threshold Amplitude: 0.5 V
Lead Channel Pacing Threshold Amplitude: 0.5 V
Lead Channel Pacing Threshold Pulse Width: 0.5 ms
Lead Channel Pacing Threshold Pulse Width: 1 ms
Lead Channel Pacing Threshold Pulse Width: 1 ms
Lead Channel Sensing Intrinsic Amplitude: 1.6 mV
Lead Channel Sensing Intrinsic Amplitude: 2.1 mV
Lead Channel Setting Pacing Amplitude: 2 V
Lead Channel Setting Pacing Amplitude: 2.5 V
Lead Channel Setting Pacing Pulse Width: 1 ms
Lead Channel Setting Sensing Sensitivity: 0.7 mV
Pulse Gen Model: 2272
Pulse Gen Serial Number: 9061801

## 2022-05-07 LAB — MAGNESIUM: Magnesium: 2.4 mg/dL — ABNORMAL HIGH (ref 1.6–2.3)

## 2022-05-07 LAB — BASIC METABOLIC PANEL
BUN/Creatinine Ratio: 21 (ref 12–28)
BUN: 18 mg/dL (ref 8–27)
CO2: 29 mmol/L (ref 20–29)
Calcium: 9.1 mg/dL (ref 8.7–10.3)
Chloride: 102 mmol/L (ref 96–106)
Creatinine, Ser: 0.84 mg/dL (ref 0.57–1.00)
Glucose: 90 mg/dL (ref 70–99)
Potassium: 4.9 mmol/L (ref 3.5–5.2)
Sodium: 142 mmol/L (ref 134–144)
eGFR: 67 mL/min/{1.73_m2} (ref >59)

## 2022-05-07 NOTE — Patient Instructions (Signed)
Medication Instructions:  Your physician recommends that you continue on your current medications as directed. Please refer to the Current Medication list given to you today.  *If you need a refill on your cardiac medications before your next appointment, please call your pharmacy*   Lab Work: TODAY: BMET, Mag  If you have labs (blood work) drawn today and your tests are completely normal, you will receive your results only by: [place] (if you have MyChart) OR A paper copy in the mail If you have any lab test that is abnormal or we need to change your treatment, we will call you to review the results.   Follow-Up: At [facility], you and your health needs are our priority.  As part of our continuing mission to provide you with exceptional heart care, we have created designated Provider Care Teams.  These Care Teams include your primary Cardiologist (physician) and Advanced Practice Providers (APPs -  Physician Assistants and Nurse Practitioners) who all work together to provide you with the care you need, when you need it.   Your next appointment:   6 month(s)  The format for your next appointment:   In Person  Provider:   Cristopher Peru, MD or Beryle Beams" Chalmers Cater, PA-C

## 2022-05-07 NOTE — Progress Notes (Signed)
Electrophysiology Office Note Date: 05/07/2022  ID:  [name], [name] August 03, 1931, MRN [id number]  PCP: Marrian Salvage, [place] Primary Cardiologist: None Electrophysiologist: Cristopher Peru, MD   CC: Pacemaker follow-up  Tricia Hernandez is a 88 y.o. female seen today for Cristopher Peru, MD for routine electrophysiology followup. Since last being seen in our clinic the patient reports doing well overall.  she denies chest pain, palpitations, dyspnea, PND, orthopnea, nausea, vomiting, dizziness, syncope, edema, weight gain, or early satiety.   Device History: St. 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 5/2 EGD: grade A reflux esophagitis.  Mild, non-hemorrhagic gastritis.  Ablation of 3 Duodenal AVMs   Herpes zoster    History of blood transfusion    for vaginal, uterine bleeding leading to hysterectomy.  in 12/2016 transfused x 1 for GI bleed.    History of hiatal hernia    Hyperlipidemia    Hypertension    Labyrinthitis    Lung cancer ([place])    RML   Obesity    On home oxygen therapy    "3L just at night when I sleep" (04/20/2018)   Paroxysmal supraventricular tachycardia (HCC)    Peptic ulcer of stomach    hx   Pneumonia 1980s X 1; 08/2016   "walking; double"   Presence of permanent cardiac pacemaker 04/20/2018   Pulmonary embolism ([place]) 05/2014   "left lung"   Seasonal allergies    "bad" (04/20/2018)   Vitamin D deficiency    Past Surgical  History:  Procedure Laterality Date   APPENDECTOMY  1959   BASAL CELL CARCINOMA EXCISION     nose x 2   BREAST CYST ASPIRATION Right 2016?   CATARACT EXTRACTION W/ INTRAOCULAR LENS  IMPLANT, BILATERAL Bilateral 1990s   CHOLECYSTECTOMY OPEN  1980s   COLON SURGERY     COLOSTOMY N/A 07/29/2016   Procedure: COLOSTOMY;  Surgeon: Clovis Riley, MD;  Location: [place];  Service: General;  Laterality: N/A;   DILATION AND CURETTAGE OF UTERUS     ESOPHAGOGASTRODUODENOSCOPY N/A 12/11/2016   Procedure: ESOPHAGOGASTRODUODENOSCOPY (EGD);  Surgeon: Jerene Bears, MD;  Location: [facility] END[facility]COPY;  Service: Endoscopy;  Laterality: N/A;   EVALUATION UNDER ANESTHESIA WITH HEMORRHOIDECTOMY AND PROCTOSCOPY N/A 07/25/2016   Procedure: EXAM UNDER ANESTHESIA, EXCISION PERIANAL MASS.;  Surgeon: Judeth Horn, MD;  Location: [facility];  Service: General;  Laterality: N/A;  Prone position   FLEXIBLE SIGMOIDOSCOPY N/A 06/16/2017   Procedure: FLEXIBLE SIGMOIDOSCOPY EXAM UNDER ANESHESIA;  Surgeon: Ileana Roup, MD;  Location: [facility];  Service: General;  Laterality: N/A;   INSERT / REPLACE / REMOVE PACEMAKER  04/20/2018   IR RADIOLOGIST EVAL & MGMT  03/25/2017   LAPAROSCOPIC DIVERTED COLOSTOMY N/A 07/29/2016   Procedure: ATTEMPTED LAPAROSCOPIC ASSISTED COLOSTOMY;  Surgeon: Clovis Riley, MD;  Location: [facility];  Service: General;  Laterality: N/A;   LUNG REMOVAL, PARTIAL Right 2013   RML mass/carcinoid, Dr Lurena Nida   PACEMAKER IMPLANT N/A 04/20/2018   Procedure: PACEMAKER IMPLANT;  Surgeon: Evans Lance, MD;  Location: [facility];  Service: Cardiovascular;  Laterality: N/A;   SQUAMOUS CELL CARCINOMA EXCISION     rectum, right leg   TONSILLECTOMY AND ADENOIDECTOMY  1960s   VAGINAL HYSTERECTOMY  1959   "partial"    Current Outpatient Medications  Medication Sig Dispense Refill   acetaminophen (TYLENOL) 325 MG tablet Take 650 mg by mouth daily as needed for moderate pain or headache.      anti-nausea  (EMETROL) solution Take 10 mLs by mouth every 15 (fifteen) minutes as needed for nausea or vomiting.     arformoterol (BROVANA) 15 MCG/2ML NEBU INHALE CONTENTS OF 1 VIAL(2MLS) VIA NEBULIZER TWICE DAILY 120 mL 11   atorvastatin (LIPITOR) 20 MG tablet TAKE 1 TABLET(20 MG) BY MOUTH AT BEDTIME 90 tablet 3   budesonide (PULMICORT) 0.5 MG/2ML nebulizer solution Use 1 vial via nebulizer twice daily 360 mL 11   Calcium 600-200 MG-UNIT tablet Take 1 tablet by mouth at bedtime.      dofetilide (TIKOSYN) 500 MCG capsule TAKE ONE CAPSULE BY MOUTH TWICE DAILY 180 capsule 3   ferrous sulfate 325 (65 FE) MG EC tablet Take 325 mg by mouth 2 (two) times daily.     fluticasone (FLONASE) 50 MCG/ACT nasal spray Place 1 spray into both nostrils daily.     furosemide (LASIX) 20 MG tablet Take 1 tablet (20 mg total) by mouth as needed. 90 tablet 3   ipratropium-albuterol (DUONEB) 0.5-2.5 (3) MG/3ML SOLN Take 3 mLs by nebulization 2 (two) times daily. 540 mL 3   levocetirizine (XYZAL) 5 MG tablet Take 1 tablet by mouth daily.     MAGNESIUM-OXIDE 400 (240 Mg) MG tablet TAKE 1 TABLET BY MOUTH DAILY 90 tablet 3   Ostomy Supplies KIT 8514 is number on bag one piece unit. Colostomy bag and adhesive and alcohol prep, no sting, cannot use regular prep pad. 30 each 11   PARoxetine (PAXIL) 40 MG tablet TAKE 1/2 TABLET(20 MG) BY MOUTH TWICE DAILY 90 tablet 3   revefenacin (YUPELRI) 175 MCG/3ML nebulizer solution Take 3 mLs (175 mcg total) by nebulization daily. 90 mL 11   vitamin B-12 (CYANOCOBALAMIN) 1000 MCG tablet Take 1,000 mcg by mouth at bedtime.      Vitamin D, Ergocalciferol, (DRISDOL) 1.25 MG (50000 UNIT) CAPS capsule TAKE 1 CAPSULE BY MOUTH 1 TIME A WEEK 12 capsule 1   No current facility-administered medications for this visit.   Facility-Administered Medications Ordered in Other Visits  Medication Dose Route Frequency Provider Last Rate Last Admin   sodium phosphate (FLEET) 7-19 GM/118ML enema 2 enema  2 enema  Rectal Once Ileana Roup, MD        Allergies:   Penicillins; Aleve [naproxen]; Aleve [naproxen sodium]; Antihistamines, chlorpheniramine-type; Aspirin; Benadryl [diphenhydramine hcl]; Codeine; Hydrochlorothiazide; Rocephin [ceftriaxone sodium in dextrose]; and Sulfa antibiotics   Social History: Social History   Socioeconomic History   Marital status: Widowed    Spouse name: Not on file   Number of children: 5   Years of education: Not on file   Highest education level: Not on file  Occupational History   Occupation: retired  Tobacco Use   Smoking status: Former    Packs/day: 0.00    Types: Cigarettes   Smokeless tobacco: Never   Tobacco comments:    04/20/2018 "  smoked once in awhile in the 1960s; not more that 4 packs total in my whole life"  Vaping Use   Vaping Use: Never used  Substance and Sexual Activity   Alcohol use: Never   Drug use: Never   Sexual activity: Not Currently  Other Topics Concern   Not on file  Social History Narrative   Patient is widowed and retired she has 5 children, she lives alone and performs all of her ADLs   Former smoker, never alcohol never drug use   Social Determinants of Radio broadcast assistant Strain: Low Risk  (02/26/2022)   Overall Financial Resource Strain (CARDIA)    Difficulty of Paying Living Expenses: Not hard at all  Food Insecurity: No Food Insecurity (02/26/2022)   Hunger Vital Sign    Worried About Running Out of Food in the Last Year: Never true    [place] in the Last Year: Never true  Transportation Needs: No Transportation Needs (02/26/2022)   PRAPARE - Hydrologist (Medical): No    Lack of Transportation (Non-Medical): No  Physical Activity: Inactive (02/26/2022)   Exercise Vital Sign    Days of Exercise per Week: 0 days    Minutes of Exercise per Session: 0 min  Stress: No Stress Concern Present (02/26/2022)   [place]    Feeling of Stress : Not at all  Social Connections: Moderately Integrated (02/26/2022)   Social Connection and Isolation Panel [NHANES]    Frequency of Communication with Friends and Family: More than three times a week    Frequency of Social Gatherings with Friends and Family: More than three times a week    Attends Religious Services: More than 4 times per year    Active Member of [employer] or Organizations: Yes    Attends Archivist Meetings: More than 4 times per year    Marital Status: Widowed  Intimate Partner Violence: Not At Risk (02/26/2022)   Humiliation, Afraid, Rape, and Kick questionnaire    Fear of Current or Ex-Partner: No    Emotionally Abused: No    Physically Abused: No    Sexually Abused: No    Family History: Family History  Problem Relation Age of Onset   Heart attack Mother        Dec 1987   CVA Mother    Hypertension Mother    Multiple myeloma Mother    Breast cancer Sister    Skin cancer Sister    Prostate cancer Brother    Throat cancer Brother    Cervical cancer Daughter    Leukemia Daughter    Leukemia Son    Throat cancer Brother    Cancer Brother        soft palette   Heart Problems Brother        Pacemaker   Colon cancer Neg Hx    Pancreatic cancer Neg Hx      Review of Systems: All other systems reviewed and are otherwise negative except as noted above.  Physical Exam: Vitals:   05/07/22 0936  BP: 132/68  Pulse: 74  SpO2: 91%  Weight: 246 lb (111.6 kg)  Height: 5' 4"  (1.626 m)     GEN- The patient is well appearing, alert and oriented x 3 today.   HEENT: normocephalic, atraumatic; sclera clear, conjunctiva pink; hearing intact; oropharynx clear; neck supple, no JVP Lymph- no cervical lymphadenopathy Lungs- Clear to ausculation  bilaterally, normal work of breathing.  No wheezes, rales, rhonchi Heart- Regular  rate and rhythm, no murmurs, rubs or gallops, PMI not laterally displaced GI- soft,  non-tender, non-distended, bowel sounds present, no hepatosplenomegaly Extremities- no clubbing or cyanosis. No peripheral edema; DP/PT/radial pulses 2+ bilaterally MS- no significant deformity or atrophy Skin- warm and dry, no rash or lesion; PPM pocket well healed Psych- euthymic mood, full affect Neuro- strength and sensation are intact  PPM Interrogation-  reviewed in detail today,  See PACEART report.  EKG:  EKG is ordered today. Personal review of ekg ordered today shows A paced at 68 bpm. Stable QT on tikosyn.   Recent Labs: 02/18/2022: ALT 14; BUN 21; Creatinine 0.91; Hemoglobin 10.9; Platelet Count 151; Potassium 5.3; Sodium 142   Wt Readings from Last 3 Encounters:  05/07/22 246 lb (111.6 kg)  02/18/22 237 lb 1.6 oz (107.5 kg)  11/12/21 243 lb 12.8 oz (110.6 kg)     Other studies Reviewed: Additional studies/ records that were reviewed today include: Previous EP office notes, Previous remote checks, Most recent labwork.   Assessment and Plan:  1. Sick sinus syndrome s/p St. Jude PPM  Normal PPM function See Claudia Desanctis Art report A lead changed to monitor and fixed at 2.0 with noise interfering with cap confirm.  Atrial lead noise noted in AMS was not reproducible.  She is NOT dependent today with AS/VS in 40-60s underlying.    2. PAF EKG today shows NSR on tikosyn  Not on OAC due to h/o AVMs/GI bleeding Labs today.   Current medicines are reviewed at length with the patient today.     Disposition:   Follow up with Dr. Lovena Le in 6 months    Signed, Shirley Friar, PA-C  05/07/2022 9:47 AM  [facility] [address] [place] [address] [phone] (office) [phone] (fax)

## 2022-05-10 NOTE — Progress Notes (Signed)
Remote pacemaker transmission.   

## 2022-05-20 ENCOUNTER — Other Ambulatory Visit: Payer: Self-pay | Admitting: Hematology and Oncology

## 2022-06-20 ENCOUNTER — Other Ambulatory Visit: Payer: Self-pay | Admitting: Family

## 2022-06-20 DIAGNOSIS — E782 Mixed hyperlipidemia: Secondary | ICD-10-CM

## 2022-06-26 ENCOUNTER — Other Ambulatory Visit: Payer: Self-pay

## 2022-06-26 ENCOUNTER — Other Ambulatory Visit: Payer: Self-pay | Admitting: Internal Medicine

## 2022-06-26 MED ORDER — DOFETILIDE 500 MCG PO CAPS: ORAL_CAPSULE | ORAL | 3 refills | Status: DC

## 2022-07-01 ENCOUNTER — Encounter: Payer: Self-pay | Admitting: Family

## 2022-07-02 ENCOUNTER — Other Ambulatory Visit: Payer: Self-pay | Admitting: Family

## 2022-07-02 MED ORDER — NITROFURANTOIN MONOHYD MACRO 100 MG PO CAPS: 100.0000 mg | ORAL_CAPSULE | Freq: Two times a day (BID) | ORAL | 0 refills | Status: DC

## 2022-07-16 ENCOUNTER — Ambulatory Visit (INDEPENDENT_AMBULATORY_CARE_PROVIDER_SITE_OTHER): Payer: Medicare Other

## 2022-07-16 DIAGNOSIS — I495 Sick sinus syndrome: Secondary | ICD-10-CM

## 2022-07-16 LAB — CUP PACEART REMOTE DEVICE CHECK
Battery Remaining Longevity: 61 mo
Battery Remaining Percentage: 60 %
Battery Voltage: 2.98 V
Brady Statistic AP VP Percent: 1 %
Brady Statistic AP VS Percent: 86 %
Brady Statistic AS VP Percent: 1 %
Brady Statistic AS VS Percent: 13 %
Brady Statistic RA Percent Paced: 85 %
Brady Statistic RV Percent Paced: 1 %
Date Time Interrogation Session: 20231205020020
Implantable Lead Connection Status: 753985
Implantable Lead Connection Status: 753985
Implantable Lead Implant Date: 20190909
Implantable Lead Implant Date: 20190909
Implantable Lead Location: 753859
Implantable Lead Location: 753860
Implantable Lead Model: 3830
Implantable Pulse Generator Implant Date: 20190909
Lead Channel Impedance Value: 340 Ohm
Lead Channel Impedance Value: 450 Ohm
Lead Channel Pacing Threshold Amplitude: 0.5 V
Lead Channel Pacing Threshold Amplitude: 0.5 V
Lead Channel Pacing Threshold Pulse Width: 0.5 ms
Lead Channel Pacing Threshold Pulse Width: 1 ms
Lead Channel Sensing Intrinsic Amplitude: 0.5 mV
Lead Channel Sensing Intrinsic Amplitude: 2.3 mV
Lead Channel Setting Pacing Amplitude: 2 V
Lead Channel Setting Pacing Amplitude: 2.5 V
Lead Channel Setting Pacing Pulse Width: 1 ms
Lead Channel Setting Sensing Sensitivity: 0.7 mV
Pulse Gen Model: 2272
Pulse Gen Serial Number: 9061801

## 2022-07-25 ENCOUNTER — Other Ambulatory Visit: Payer: Self-pay | Admitting: Family

## 2022-08-14 NOTE — Progress Notes (Signed)
Remote pacemaker transmission.   

## 2022-10-10 ENCOUNTER — Other Ambulatory Visit: Payer: Self-pay | Admitting: Family

## 2022-10-15 ENCOUNTER — Ambulatory Visit (INDEPENDENT_AMBULATORY_CARE_PROVIDER_SITE_OTHER): Payer: Medicare Other

## 2022-10-15 ENCOUNTER — Ambulatory Visit: Payer: Medicare Other | Admitting: Internal Medicine

## 2022-10-15 DIAGNOSIS — I495 Sick sinus syndrome: Secondary | ICD-10-CM

## 2022-10-15 LAB — CUP PACEART REMOTE DEVICE CHECK
Battery Remaining Longevity: 57 mo
Battery Remaining Percentage: 58 %
Battery Voltage: 2.98 V
Brady Statistic AP VP Percent: 1.1 %
Brady Statistic AP VS Percent: 77 %
Brady Statistic AS VP Percent: 1 %
Brady Statistic AS VS Percent: 18 %
Brady Statistic RA Percent Paced: 72 %
Brady Statistic RV Percent Paced: 1.9 %
Date Time Interrogation Session: 20240305020015
Implantable Lead Connection Status: 753985
Implantable Lead Connection Status: 753985
Implantable Lead Implant Date: 20190909
Implantable Lead Implant Date: 20190909
Implantable Lead Location: 753859
Implantable Lead Location: 753860
Implantable Lead Model: 3830
Implantable Pulse Generator Implant Date: 20190909
Lead Channel Impedance Value: 330 Ohm
Lead Channel Impedance Value: 440 Ohm
Lead Channel Pacing Threshold Amplitude: 0.5 V
Lead Channel Pacing Threshold Amplitude: 0.5 V
Lead Channel Pacing Threshold Pulse Width: 0.5 ms
Lead Channel Pacing Threshold Pulse Width: 1 ms
Lead Channel Sensing Intrinsic Amplitude: 2.1 mV
Lead Channel Sensing Intrinsic Amplitude: 2.7 mV
Lead Channel Setting Pacing Amplitude: 2 V
Lead Channel Setting Pacing Amplitude: 2.5 V
Lead Channel Setting Pacing Pulse Width: 1 ms
Lead Channel Setting Sensing Sensitivity: 0.7 mV
Pulse Gen Model: 2272
Pulse Gen Serial Number: 9061801

## 2022-10-28 ENCOUNTER — Encounter: Payer: Medicare Other | Admitting: Student

## 2022-10-30 NOTE — Progress Notes (Addendum)
Cardiology Office Note Date:  05/27/2017  Patient ID:  Tricia Hernandez, Tricia Hernandez [birth year], MRN [id number] PCP:  Flossie Buffy, NP  Electrophysiologist:  Dr. Lovena Le Pulmonary: Dr. Concepcion Living GI: Dr. Carlean Purl    Chief Complaint:   6 mo  History of Present Illness: Tricia Hernandez is a 89 y.o. female with history of AFib (difficult to control), HTN, diastolic chronic CHF, obesity, HLD, Lung cancer treated surgically, anal cancer, has colostomy, chronic bronchitis/COPD follows with pulmonary.  I saw her back in 2018 The patient comes accompanied by her daughter who helps with HPI/current issues.  She states it has been decided given AVMs that she can no longer be on a/c.  ( I note in July brief GI note with capsule endoscopy with ongoing bleeding and requiring of a number of transfusions) Dr. Lovena Le was in agreement, best to stop a/c.  Since stopping she has not had any further bleeding.  The patient feels like she is doing well in general.  She denies any kind of cardiac awareness, no CP or palpitations, no symptoms that have made her suspect she has been out of rhythm.  No dizziness since her blood counts improved, no near near syncope or syncope.  She sleeps well without symptoms of PND or orthopnea, she cleans her house and has no trouble with her ADLs, ambulates with a walker, denies SOB or DOE. She was maintaining SR with stable QTc on paxil  Subsequently PPM implanted for SSSx  She has seen Dr. Lovena Le, Jonni Sanger since then, most recently saw Jonni Sanger 05/07/22, noise on devic noted though not reproducible, A lead changed to monitor and fixed at 2.0 with noise interfering with cap confirm. Not dependent with SB 40's-60s underlying.  She was doing well without symptoms. No medication changes made.  TODAY She is accompanied by her daughter today She feels well from a cardiac perspective, denies any CP, occasionally has an awareness of her heart beat though not racing or symptomatic. She denies any  syncope She has had once a few weeks ago while eating seated, felt a fleeting sense of lightheadedness, "just shook my head and it was gone". 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 5/2 EGD: grade A reflux esophagitis.  Mild, non-hemorrhagic gastritis.  Ablation of 3 Duodenal AVMs   Herpes zoster    History of blood transfusion    for vaginal, uterine bleeding leading to hysterectomy.  in 12/2016 transfused x 1 for GI bleed.    History of hiatal hernia    Hyperlipidemia    Hypertension    Labyrinthitis  Lung cancer ([place])    RML   Obesity    Paroxysmal supraventricular tachycardia ([place])    Peptic ulcer of stomach    hx   Pneumonia 1980s X 1; 08/2016   "walking; double"   Pulmonary embolism ([place]) 05/2014   "left lung"   Vitamin D deficiency     Past Surgical History:  Procedure Laterality Date   APPENDECTOMY  1959   BASAL CELL CARCINOMA EXCISION     nose   BREAST CYST ASPIRATION Right 2016?   CATARACT EXTRACTION W/ INTRAOCULAR LENS  IMPLANT, BILATERAL Bilateral 1990s   CHOLECYSTECTOMY OPEN  1980s   COLOSTOMY N/A 07/29/2016   Procedure: COLOSTOMY;  Surgeon: Clovis Riley, MD;  Location: [place];  Service:  General;  Laterality: N/A;   DILATION AND CURETTAGE OF UTERUS     ESOPHAGOGASTRODUODENOSCOPY N/A 12/11/2016   Procedure: ESOPHAGOGASTRODUODENOSCOPY (EGD);  Surgeon: Jerene Bears, MD;  Location[facility] ENDOSCOPY;  Service: Endoscopy;  Laterality: N/A;   EVALUATION UNDER ANESTHESIA WITH HEMORRHOIDECTOMY AND PROCTOSCOPY N/A 07/25/2016   Procedure: EXAM UNDER ANESTHESIA, EXCISION PERIANAL MASS.;  Surgeon: Judeth Horn, MD;  Location: [place];  Service: General;  Laterality: N/A;  Prone position   IR RADIOLOGIST EVAL & MGMT  03/25/2017   LAPAROSCOPIC DIVERTED COLOSTOMY N/A 07/29/2016   Procedure: ATTEMPTED LAPAROSCOPIC ASSISTED COLOSTOMY;  Surgeon: Clovis Riley, MD;  Location: [place];  Service: General;  Laterality: N/A;   LUNG REMOVAL, PARTIAL Right 2013   RML mass/carcinoid, Dr Lurena Nida   TONSILLECTOMY AND ADENOIDECTOMY  1960s   VAGINAL HYSTERECTOMY  1959   "partial"    Current Outpatient Prescriptions  Medication Sig Dispense Refill   arformoterol (BROVANA) 15 MCG/2ML NEBU Take 2 mLs (15 mcg total) by nebulization 2 (two) times daily. Dx copd: J44.9 120 mL 5   atorvastatin (LIPITOR) 20 MG tablet Take 1 tablet (20 mg total) by mouth daily. 90 tablet 1   budesonide (PULMICORT) 0.5 MG/2ML nebulizer solution Take 2 mLs (0.5 mg total) by nebulization 2 (two) times daily. Dx code: j44.9 120 mL 5   Calcium 600-200 MG-UNIT tablet Take 1 tablet by mouth daily.     dofetilide (TIKOSYN) 500 MCG capsule Take 1 capsule (500 mcg total) by mouth 2 (two) times daily. 180 capsule 3   ergocalciferol (VITAMIN D2) 50000 units capsule Take 1 capsule (50,000 Units total) by mouth once a week. 12 capsule 3   fenofibrate 160 MG tablet Take 1 tablet (160 mg total) by mouth daily. 90 tablet 1   ferrous sulfate 325 (65 FE) MG EC tablet Take 325 mg by mouth 2 (two) times daily.     fluticasone (FLONASE) 50 MCG/ACT nasal spray Place 1 spray into both nostrils daily.     furosemide (LASIX) 20 MG tablet Take 1 tablet (20 mg  total) by mouth daily. 90 tablet 0   ipratropium-albuterol (DUONEB) 0.5-2.5 (3) MG/3ML SOLN Take 3 mLs by nebulization 2 (two) times daily. 540 mL 0   magnesium oxide (MAG-OX) 400 (241.3 Mg) MG tablet Take 1 tablet (400 mg total) by mouth daily. Follow-up appt is due for future refills 90 tablet 0   montelukast (SINGULAIR) 10 MG tablet Take 1 tablet (10 mg total) by mouth at bedtime. 90 tablet 1   Ostomy Supplies KIT 8514 is number on bag one piece unit. Colostomy bag and adhesive and alcohol prep, no sting, cannot use regular prep pad. 30 each 11   pantoprazole (PROTONIX) 40 MG tablet Take 1 tablet (40 mg total)  by mouth daily. Follow-up appt is due for future refills 90 tablet 0   PARoxetine (PAXIL) 20 MG tablet Take 1 tablet (20 mg total) by mouth at bedtime. 90 tablet 0   No current facility-administered medications for this visit.     Allergies:   Penicillins; Aleve [naproxen sodium]; Antihistamines, chlorpheniramine-type; Aspirin; Benadryl [diphenhydramine hcl]; Codeine; Hydrochlorothiazide; Rocephin [ceftriaxone sodium in dextrose]; and Sulfa antibiotics   Social History:  The patient  reports that she quit smoking about 42 years ago. Her smoking use included Cigarettes. She has a 7.50 pack-year smoking history. She has never used smokeless tobacco. She reports that she does not drink alcohol or use drugs.   Family History:  The patient's family history includes Breast cancer in her sister; CVA in her mother; Cancer in her brother; Cervical cancer in her daughter; Heart Problems in her brother; Heart attack in her mother; Hypertension in her mother; Leukemia in her daughter and son; Multiple myeloma in her mother; Prostate cancer in her brother; Skin cancer in her sister; Throat cancer in her brother and brother.  ROS:  Please see the history of present illness.  All other systems are reviewed and otherwise negative.   PHYSICAL EXAM:  VS:  BP 130/72   Pulse (!) 55   Ht 5\' 4"  (1.626 m)    Wt 226 lb (102.5 kg)   BMI 38.79 kg/m  BMI: Body mass index is 38.79 kg/m. Well nourished, well developed, in no acute distress  HEENT: normocephalic, atraumatic  Neck: no JVD, carotid bruits or masses Cardiac: irreg-irreg; 2/6 SM, no rubs, or gallops Lungs: CTA b/l, no wheezing, rhonchi or rales  Abd: colostomy MS: no deformity, age appropriate atrophy Ext: no pitting edema  Skin: warm and dry, no rash Neuro:  No gross deficits appreciated Psych: euthymic mood, full affect   EKG:  Done today and reviewed by myself  AFib 100bpm, PVC, QTc is 417ms  Device interrogation done today and reviewed by myself Battery and lead measurements are good + AMS episodes Current episode says 5 hours, though note some undersensing of AFib, so likely longer. I do see yesterday morning she was in probably an ATach (140's) leading into AFib noting the onset of AFib then  No HVR episodes + noise episodes noted (known for her ) on the A channel, impedance is stable   TTE 12/13/16  Review of the above records today demonstrates:  - Left ventricle: The cavity size was normal. Wall thickness was   increased in a pattern of mild LVH. Systolic function was normal.   The estimated ejection fraction was in the range of 55% to 60%.   Wall motion was normal; there were no regional wall motion   abnormalities. The study is not technically sufficient to allow   evaluation of LV diastolic function. - Aortic valve: Trileaflet. Sclerosis without stenosis. There was   no regurgitation. - Right ventricle: The cavity size was normal. Systolic function is   mildly reduced. - Tricuspid valve: There was mild regurgitation. - Pulmonary arteries: PA peak pressure: 39 mm Hg (S). - Inferior vena cava: The vessel was normal in size. The   respirophasic diameter changes were in the normal range (>= 50%),   consistent with normal central venous pressure.    Recent Labs: 12/10/2016: Magnesium 2.1 12/11/2016: TSH  1.558 02/21/2017: Pro B Natriuretic peptide (BNP) 354.0 02/28/2017: ALT 10; B Natriuretic Peptide 333.8; BUN 19; Creatinine, Ser 0.98; Potassium 4.3; Sodium 140 04/16/2017: Hemoglobin 11.4; Platelets 176  11/04/2016: Cholesterol 144; HDL 46.20; LDL Cholesterol 69; Total CHOL/HDL Ratio 3; Triglycerides 145.0; VLDL 29.0   CrCl cannot be calculated (Patient's most recent lab result is older than the maximum 21 days allowed.).   Wt Readings from Last 3 Encounters:  05/27/17 226 lb (102.5 kg)  03/25/17 237 lb (107.5 kg)  03/25/17 237 lb 9.6 oz (107.8 kg)     Other studies reviewed: Additional studies/records reviewed today include: summarized above  ASSESSMENT AND PLAN:  1. Paroxysmal AFib     CHA2DS2Vasc is 5, not on a/c with recurrent GIB/AVMs     Increasing AFib burden  Some of this is noise, though also note undersensing of true AFib as well. She is not anticoagulated, and in d/w patient/daughter today, has been advised by GI that she can not be  Discussed today, she has been in AF <48 hours though won't be able to try to pace terminate AFib and not certain that an emergent DCCV via the ER given no symptoms is warranted, though would get her back to SR, unable to anticoagulate afterwards.  stroke risk was discussed today With minimal cardiac awareness they are inclined to pursue rate control strategy.  Will stop tikosyn, start diltiazem 120mg  daily with rates 100's-110's  She has a home BP cuff and I have asked she monitor it at home She has had a brief dizzy spell, and some sense of feeling of balance on occasion. Advised better hydration at home as well  Discussed with an EP MD in the office, could think about perhaps brief a/c to get her on amiodarone and DCCV, I will d/w Dr. Lovena Le   2. HTN     Should allow the dilt ok   3. PPM As above Known A lead noise, not dependent In AFib currently No programming changes made    4. Secondary hypercoagulable  state    Disposition: I will have her back in a month, assess symptoms, tolerance to dilt, HR and rhythm.     Current medicines are reviewed at length with the patient today.  The patient did not have any concerns regarding medicines.  Venetia Night, PA-C 05/27/2017 10:04 AM     [facility] [place] [address] [phone] (office)  [phone] (fax)

## 2022-11-05 ENCOUNTER — Encounter: Payer: Self-pay | Admitting: Physician Assistant

## 2022-11-05 ENCOUNTER — Ambulatory Visit: Payer: Medicare Other | Attending: Internal Medicine | Admitting: Physician Assistant

## 2022-11-05 VITALS — BP 142/80 | HR 100 | Ht 64.0 in | Wt 242.0 lb

## 2022-11-05 DIAGNOSIS — I480 Paroxysmal atrial fibrillation: Secondary | ICD-10-CM | POA: Diagnosis not present

## 2022-11-05 DIAGNOSIS — Z5181 Encounter for therapeutic drug level monitoring: Secondary | ICD-10-CM | POA: Diagnosis present

## 2022-11-05 DIAGNOSIS — I10 Essential (primary) hypertension: Secondary | ICD-10-CM | POA: Insufficient documentation

## 2022-11-05 DIAGNOSIS — Z950 Presence of cardiac pacemaker: Secondary | ICD-10-CM | POA: Insufficient documentation

## 2022-11-05 DIAGNOSIS — Z79899 Other long term (current) drug therapy: Secondary | ICD-10-CM | POA: Diagnosis present

## 2022-11-05 DIAGNOSIS — I495 Sick sinus syndrome: Secondary | ICD-10-CM | POA: Insufficient documentation

## 2022-11-05 LAB — CUP PACEART INCLINIC DEVICE CHECK
Battery Remaining Longevity: 64 mo
Battery Voltage: 2.98 V
Brady Statistic RA Percent Paced: 68 %
Brady Statistic RV Percent Paced: 1.9 %
Date Time Interrogation Session: 20240326112159
Implantable Lead Connection Status: 753985
Implantable Lead Connection Status: 753985
Implantable Lead Implant Date: 20190909
Implantable Lead Implant Date: 20190909
Implantable Lead Location: 753859
Implantable Lead Location: 753860
Implantable Lead Model: 3830
Implantable Pulse Generator Implant Date: 20190909
Lead Channel Impedance Value: 325 Ohm
Lead Channel Impedance Value: 437.5 Ohm
Lead Channel Pacing Threshold Amplitude: 0 V
Lead Channel Pacing Threshold Amplitude: 0.75 V
Lead Channel Pacing Threshold Amplitude: 0.75 V
Lead Channel Pacing Threshold Pulse Width: 0.5 ms
Lead Channel Pacing Threshold Pulse Width: 1 ms
Lead Channel Pacing Threshold Pulse Width: 1 ms
Lead Channel Sensing Intrinsic Amplitude: 2 mV
Lead Channel Sensing Intrinsic Amplitude: 7.5 mV
Lead Channel Setting Pacing Amplitude: 2 V
Lead Channel Setting Pacing Amplitude: 2.5 V
Lead Channel Setting Pacing Pulse Width: 1 ms
Lead Channel Setting Sensing Sensitivity: 0.7 mV
Pulse Gen Model: 2272
Pulse Gen Serial Number: 9061801

## 2022-11-05 MED ORDER — DILTIAZEM HCL ER COATED BEADS 120 MG PO CP24: 120.0000 mg | ORAL_CAPSULE | Freq: Every day | ORAL | 2 refills | Status: DC

## 2022-11-05 NOTE — Patient Instructions (Addendum)
Medication Instructions:   START TAKING: DILTIAZEM 120 MG ONCE A DAY   STOP TAKING AND REMOVE THIS MEDICATION FROM YOUR MEDICATION LIST:  TIKOSYN   *If you need a refill on your cardiac medications before your next appointment, please call your pharmacy*   Lab Work: NONE ORDERED  TODAY    If you have labs (blood work) drawn today and your tests are completely normal, you will receive your results only by: [place] (if you have MyChart) OR A paper copy in the mail If you have any lab test that is abnormal or we need to change your treatment, we will call you to review the results.   Testing/Procedures: NONE ORDERED  TODAY   Follow-Up: At [facility], you and your health needs are our priority.  As part of our continuing mission to provide you with exceptional heart care, we have created designated Provider Care Teams.  These Care Teams include your primary Cardiologist (physician) and Advanced Practice Providers (APPs -  Physician Assistants and Nurse Practitioners) who all work together to provide you with the care you need, when you need it.  We recommend signing up for the patient portal called "MyChart".  Sign up information is provided on this After Visit Summary.  MyChart is used to connect with patients for Virtual Visits (Telemedicine).  Patients are able to view lab/test results, encounter notes, upcoming appointments, etc.  Non-urgent messages can be sent to your provider as well.   To learn more about what you can do with MyChart, go to [email or web address].    Your next appointment:   1-2  month(s)  Provider:   Tommye Standard, PA-C    Other Instructions

## 2022-11-12 ENCOUNTER — Ambulatory Visit (HOSPITAL_BASED_OUTPATIENT_CLINIC_OR_DEPARTMENT_OTHER)
Admission: RE | Admit: 2022-11-12 | Discharge: 2022-11-12 | Disposition: A | Discharge status: Home / Self Care | Payer: Medicare Other | Source: Ambulatory Visit | Attending: Family | Admitting: Family

## 2022-11-12 ENCOUNTER — Encounter: Payer: Self-pay | Admitting: Family

## 2022-11-12 ENCOUNTER — Other Ambulatory Visit: Payer: Medicare Other

## 2022-11-12 ENCOUNTER — Ambulatory Visit (INDEPENDENT_AMBULATORY_CARE_PROVIDER_SITE_OTHER): Payer: Medicare Other | Admitting: Family

## 2022-11-12 VITALS — BP 132/84 | HR 89 | Resp 18 | Ht 64.0 in | Wt 240.6 lb

## 2022-11-12 DIAGNOSIS — R0789 Other chest pain: Secondary | ICD-10-CM

## 2022-11-12 DIAGNOSIS — R7989 Other specified abnormal findings of blood chemistry: Secondary | ICD-10-CM

## 2022-11-12 DIAGNOSIS — R5383 Other fatigue: Secondary | ICD-10-CM | POA: Diagnosis not present

## 2022-11-12 DIAGNOSIS — E782 Mixed hyperlipidemia: Secondary | ICD-10-CM

## 2022-11-12 LAB — CBC WITH DIFFERENTIAL/PLATELET
Basophils Absolute: 0 10*3/uL (ref 0.0–0.1)
Basophils Relative: 0.3 % (ref 0.0–3.0)
Eosinophils Absolute: 0.2 10*3/uL (ref 0.0–0.7)
Eosinophils Relative: 3.2 % (ref 0.0–5.0)
HCT: 37.2 % (ref 36.0–46.0)
Hemoglobin: 11.7 g/dL — ABNORMAL LOW (ref 12.0–15.0)
Lymphocytes Relative: 18.2 % (ref 12.0–46.0)
Lymphs Abs: 1.2 10*3/uL (ref 0.7–4.0)
MCHC: 31.4 g/dL (ref 30.0–36.0)
MCV: 78.7 fl (ref 78.0–100.0)
Monocytes Absolute: 0.5 10*3/uL (ref 0.1–1.0)
Monocytes Relative: 6.9 % (ref 3.0–12.0)
Neutro Abs: 4.8 10*3/uL (ref 1.4–7.7)
Neutrophils Relative %: 71.4 % (ref 43.0–77.0)
Platelets: 165 10*3/uL (ref 150.0–400.0)
RBC: 4.73 Mil/uL (ref 3.87–5.11)
RDW: 23.5 % — ABNORMAL HIGH (ref 11.5–15.5)
WBC: 6.7 10*3/uL (ref 4.0–10.5)

## 2022-11-12 LAB — COMPREHENSIVE METABOLIC PANEL
ALT: 9 U/L (ref 0–35)
AST: 17 U/L (ref 0–37)
Albumin: 4.2 g/dL (ref 3.5–5.2)
Alkaline Phosphatase: 97 U/L (ref 39–117)
BUN: 19 mg/dL (ref 6–23)
CO2: 33 mEq/L — ABNORMAL HIGH (ref 19–32)
Calcium: 9.1 mg/dL (ref 8.4–10.5)
Chloride: 101 mEq/L (ref 96–112)
Creatinine, Ser: 0.91 mg/dL (ref 0.40–1.20)
GFR: 56.01 mL/min — ABNORMAL LOW (ref >60.00)
Glucose, Bld: 90 mg/dL (ref 70–99)
Potassium: 4.9 mEq/L (ref 3.5–5.1)
Sodium: 140 mEq/L (ref 135–145)
Total Bilirubin: 0.5 mg/dL (ref 0.2–1.2)
Total Protein: 6.3 g/dL (ref 6.0–8.3)

## 2022-11-12 LAB — LDL CHOLESTEROL, DIRECT: Direct LDL: 61 mg/dL

## 2022-11-12 LAB — LIPID PANEL
Cholesterol: 152 mg/dL (ref 0–200)
HDL: 55.5 mg/dL (ref >39.00)
NonHDL: 96.06
Total CHOL/HDL Ratio: 3
Triglycerides: 213 mg/dL — ABNORMAL HIGH (ref 0.0–149.0)
VLDL: 42.6 mg/dL — ABNORMAL HIGH (ref 0.0–40.0)

## 2022-11-12 LAB — TSH: TSH: 1.95 u[IU]/mL (ref 0.35–5.50)

## 2022-11-12 LAB — VITAMIN B12: Vitamin B-12: >1500 pg/mL — ABNORMAL HIGH (ref 211–911)

## 2022-11-12 NOTE — Patient Instructions (Signed)
To taper of Paxil-  Cut back to 1/2 tablet once per day x 5 days and then 1/2 tablet every other day x 3 days and then stop;  Wait 24 hours and then start the Lexapro 10 mg daily;

## 2022-11-12 NOTE — Progress Notes (Signed)
Tricia Hernandez is a 89 y.o. female with the following history as recorded in EpicCare:  Patient Active Problem List   Diagnosis Date Noted   COPD with chronic bronchitis and emphysema 03/23/2019   Anxiety and depression 11/02/2018   Allergic rhinitis 11/02/2018   Pacemaker 04/20/2018   Sinus node dysfunction 04/14/2018   Vitamin D deficiency 05/27/2017   Venous stasis dermatitis of both lower extremities 02/23/2017   Anemia 02/22/2017   Acid reflux 02/22/2017   Colostomy care 12/20/2016   Angiodysplasia of duodenum    GI bleeding 12/10/2016   Mixed hyperlipidemia 11/05/2016   Chronic bronchitis 11/04/2016   Persistent atrial fibrillation 10/07/2016   Acute respiratory failure with hypoxia 08/26/2016   Essential hypertension    Pulmonary nodules    Palliative care encounter    Anal cancer 07/22/2016   Atrial fibrillation ([place]), CHA2DS2-VASc Score 5 07/22/2016   Pulmonary embolus    Obesity    Lung cancer     Current Outpatient Medications  Medication Sig Dispense Refill   acetaminophen (TYLENOL) 325 MG tablet Take 650 mg by mouth daily as needed for moderate pain or headache.      ALLERGY RELIEF 180 MG tablet TAKE 1 TABLET(180 MG) BY MOUTH DAILY 90 tablet 3   anti-nausea (EMETROL) solution Take 10 mLs by mouth every 15 (fifteen) minutes as needed for nausea or vomiting.     arformoterol (BROVANA) 15 MCG/2ML NEBU INHALE CONTENTS OF 1 VIAL(2MLS) VIA NEBULIZER TWICE DAILY 120 mL 11   atorvastatin (LIPITOR) 20 MG tablet TAKE 1 TABLET(20 MG) BY MOUTH AT BEDTIME 90 tablet 3   budesonide (PULMICORT) 0.5 MG/2ML nebulizer solution Use 1 vial via nebulizer twice daily 360 mL 11   Calcium 600-200 MG-UNIT tablet Take 1 tablet by mouth at bedtime.      diltiazem (CARDIZEM CD) 120 MG 24 hr capsule Take 1 capsule (120 mg total) by mouth daily. 90 capsule 2   ferrous sulfate 325 (65 FE) MG EC tablet Take 325 mg by mouth 2 (two) times daily.     fluticasone (FLONASE) 50 MCG/ACT nasal spray  Place 1 spray into both nostrils daily.     furosemide (LASIX) 20 MG tablet Take 1 tablet (20 mg total) by mouth as needed. 90 tablet 3   ipratropium-albuterol (DUONEB) 0.5-2.5 (3) MG/3ML SOLN Take 3 mLs by nebulization 2 (two) times daily. 540 mL 3   levocetirizine (XYZAL) 5 MG tablet Take 1 tablet by mouth daily.     MAGnesium-Oxide 400 (240 Mg) MG tablet Take 1 tablet (400 mg total) by mouth daily. Please contact the office to schedule appointment for additional refills. 90 tablet 1   nitrofurantoin, macrocrystal-monohydrate, (MACROBID) 100 MG capsule Take 1 capsule (100 mg total) by mouth 2 (two) times daily. 14 capsule 0   Ostomy Supplies KIT 8514 is number on bag one piece unit. Colostomy bag and adhesive and alcohol prep, no sting, cannot use regular prep pad. 30 each 11   revefenacin (YUPELRI) 175 MCG/3ML nebulizer solution Take 3 mLs (175 mcg total) by nebulization daily. 90 mL 11   vitamin B-12 (CYANOCOBALAMIN) 1000 MCG tablet Take 1,000 mcg by mouth at bedtime.      Vitamin D, Ergocalciferol, (DRISDOL) 1.25 MG (50000 UNIT) CAPS capsule TAKE 1 CAPSULE BY MOUTH 1 TIME A WEEK 12 capsule 1   No current facility-administered medications for this visit.   Facility-Administered Medications Ordered in Other Visits  Medication Dose Route Frequency Provider Last Rate Last Admin   sodium  phosphate (FLEET) 7-19 GM/118ML enema 2 enema  2 enema Rectal Once Ileana Roup, MD        Allergies: Penicillins; Aleve [naproxen]; Aleve [naproxen sodium]; Antihistamines, chlorpheniramine-type; Aspirin; Benadryl [diphenhydramine hcl]; Codeine; Hydrochlorothiazide; Rocephin [ceftriaxone sodium in dextrose]; and Sulfa antibiotics  Past Medical History:  Diagnosis Date   Anal squamous cell carcinoma 07/2016   "spread to lymph nodes and groins"    Anemia    Anxiety    Atrial fibrillation    Basal cell carcinoma of skin of nose    CHF (congestive heart failure) 11/2015   Chronic bronchitis     Chronic depression    Colostomy in place 07/2016   Diverticular disease    Fibrocystic disease of breast    Fracture of shoulder    GERD (gastroesophageal reflux disease)    GI bleed 12/10/2016   in setting of no PPI. 5/2 EGD: grade A reflux esophagitis.  Mild, non-hemorrhagic gastritis.  Ablation of 3 Duodenal AVMs   Herpes zoster    History of blood transfusion    for vaginal, uterine bleeding leading to hysterectomy.  in 12/2016 transfused x 1 for GI bleed.    History of hiatal hernia    Hyperlipidemia    Hypertension    Labyrinthitis    Lung cancer    RML   Obesity    On home oxygen therapy    "3L just at night when I sleep" (04/20/2018)   Paroxysmal supraventricular tachycardia    Peptic ulcer of stomach    hx   Pneumonia 1980s X 1; 08/2016   "walking; double"   Presence of permanent cardiac pacemaker 04/20/2018   Pulmonary embolism 05/2014   "left lung"   Seasonal allergies    "bad" (04/20/2018)   Vitamin D deficiency     Past Surgical History:  Procedure Laterality Date   APPENDECTOMY  1959   BASAL CELL CARCINOMA EXCISION     nose x 2   BREAST CYST ASPIRATION Right 2016?   CATARACT EXTRACTION W/ INTRAOCULAR LENS  IMPLANT, BILATERAL Bilateral 1990s   CHOLECYSTECTOMY OPEN  1980s   COLON SURGERY     COLOSTOMY N/A 07/29/2016   Procedure: COLOSTOMY;  Surgeon: Clovis Riley, MD;  Location: [place];  Service: General;  Laterality: N/A;   DILATION AND CURETTAGE OF UTERUS     ESOPHAGOGASTRODUODENOSCOPY N/A 12/11/2016   Procedure: ESOPHAGOGASTRODUODENOSCOPY (EGD);  Surgeon: Jerene Bears, MD;  Location: [facility] ENDOSCOPY;  Service: Endoscopy;  Laterality: N/A;   EVALUATION UNDER ANESTHESIA WITH HEMORRHOIDECTOMY AND PROCTOSCOPY N/A 07/25/2016   Procedure: EXAM UNDER ANESTHESIA, EXCISION PERIANAL MASS.;  Surgeon: Judeth Horn, MD;  Location: [place];  Service: General;  Laterality: N/A;  Prone position   FLEXIBLE SIGMOIDOSCOPY N/A 06/16/2017   Procedure: FLEXIBLE SIGMOIDOSCOPY EXAM UNDER  ANESHESIA;  Surgeon: Ileana Roup, MD;  Location: [facility];  Service: General;  Laterality: N/A;   INSERT / REPLACE / REMOVE PACEMAKER  04/20/2018   IR RADIOLOGIST EVAL & MGMT  03/25/2017   LAPAROSCOPIC DIVERTED COLOSTOMY N/A 07/29/2016   Procedure: ATTEMPTED LAPAROSCOPIC ASSISTED COLOSTOMY;  Surgeon: Clovis Riley, MD;  Location: [place];  Service: General;  Laterality: N/A;   LUNG REMOVAL, PARTIAL Right 2013   RML mass/carcinoid, Dr Lurena Nida   PACEMAKER IMPLANT N/A 04/20/2018   Procedure: PACEMAKER IMPLANT;  Surgeon: Evans Lance, MD;  Location: [place] [facility];  Service: Cardiovascular;  Laterality: N/A;   SQUAMOUS CELL CARCINOMA EXCISION     rectum, right leg  TONSILLECTOMY AND ADENOIDECTOMY  1960s   VAGINAL HYSTERECTOMY  1959   "partial"    Family History  Problem Relation Age of Onset   Heart attack Mother        Dec 1987   CVA Mother    Hypertension Mother    Multiple myeloma Mother    Breast cancer Sister    Skin cancer Sister    Prostate cancer Brother    Throat cancer Brother    Cervical cancer Daughter    Leukemia Daughter    Leukemia Son    Throat cancer Brother    Cancer Brother        soft palette   Heart Problems Brother        Pacemaker   Colon cancer Neg Hx    Pancreatic cancer Neg Hx     Social History   Tobacco Use   Smoking status: Former    Packs/day: 0    Types: Cigarettes   Smokeless tobacco: Never   Tobacco comments:    04/20/2018 "smoked once in awhile in the 1960s; not more that 4 packs total in my whole life"  Substance Use Topics   Alcohol use: Never    Subjective:   Accompanied by her daughter for yearly exam; would like to discuss changing from Paxil to Lexapro;  Daughter also mentions that they might want to consider memory testing and/or trial of medication for memory- prefer to get the Paxil adjusted first however; Patient has been having some atypical chest pain/ rib pain for a few months- daughter feels it is where  she is pulling up on hospital bed; patient has history of lung cancer- last CT done in 2021;   Objective:  Vitals:   11/12/22 1035  BP: 132/84  Pulse: 89  Resp: 18  SpO2: 97%  Weight: 240 lb 9.6 oz (109.1 kg)  Height: 5\' 4"  (1.626 m)    General: Well developed, well nourished, in no acute distress  Skin : Warm and dry.  Head: Normocephalic and atraumatic  Eyes: Sclera and conjunctiva clear; pupils round and reactive to light; extraocular movements intact  Ears: External normal; canals clear; tympanic membranes normal  Oropharynx: Pink, supple. No suspicious lesions  Neck: Supple without thyromegaly, adenopathy  Lungs: Respirations unlabored; clear to auscultation bilaterally without wheeze, rales, rhonchi  CVS exam: normal rate and regular rhythm.  Neurologic: Alert and oriented; speech intact; face symmetrical; uses walker;   Assessment:  1. Other fatigue   2. Mixed hyperlipidemia   3. Low vitamin B12 level   4. Atypical chest pain     Plan:  Will update labs today; update CXR, right rib X-ray; Will change to Lexapro as requested by family- Paxil taper is reviewed; Discussed referral to neurology and/or trial of Aricept- family will decide how they want to proceed and call back;  Also discussed referral to Chronic Care management to evaluate resources available to patient- family wants to consider and will let me know their interest;   Time spent 30 minutes  No follow-ups on file.  Orders Placed This Encounter  Procedures   DG Chest 2 View    Standing Status:   Future    Number of Occurrences:   1    Standing Expiration Date:   11/12/2023    Order Specific Question:   Reason for Exam (SYMPTOM  OR DIAGNOSIS REQUIRED)    Answer:   atypical chest pain    Order Specific Question:   Preferred imaging location?  Answer:   [facility]   DG Ribs Unilateral Right    Standing Status:   Future    Number of Occurrences:   1    Standing Expiration Date:   11/12/2023     Order Specific Question:   Reason for Exam (SYMPTOM  OR DIAGNOSIS REQUIRED)    Answer:   right rib pain    Order Specific Question:   Preferred imaging location?    Answer:   [facility]   CBC with Differential/Platelet   Comp Met (CMET)   Lipid panel   TSH   B12    Requested Prescriptions    No prescriptions requested or ordered in this encounter

## 2022-11-13 ENCOUNTER — Encounter: Payer: Self-pay | Admitting: Family

## 2022-11-18 ENCOUNTER — Telehealth: Payer: Self-pay

## 2022-11-18 MED ORDER — ESCITALOPRAM OXALATE 10 MG PO TABS: 10.0000 mg | ORAL_TABLET | Freq: Every day | ORAL | 0 refills | Status: DC

## 2022-11-18 NOTE — Telephone Encounter (Signed)
Pt's daughter [name] called- stated Pt saw Vernona Rieger on 11/12/22 and escitalopram was supposed to be sent into her pharmacy but has not been. Chart reviewed,   To taper of Paxil-   Cut back to 1/2 tablet once per day x 5 days and then 1/2 tablet every other day x 3 days and then stop;  Wait 24 hours and then start the Lexapro 10 mg daily;       Rx sent to [facility] in [place] as requested.

## 2022-11-19 ENCOUNTER — Other Ambulatory Visit: Payer: Self-pay | Admitting: Hematology and Oncology

## 2022-11-27 ENCOUNTER — Telehealth: Payer: Self-pay | Admitting: Family

## 2022-11-27 NOTE — Telephone Encounter (Signed)
Daughter [name], called to let Vernona Rieger know that Lexapro is working well for pt. She would like to start pt on whatever the memory pill is. Please send to [facility] in [place].

## 2022-11-28 ENCOUNTER — Other Ambulatory Visit: Payer: Self-pay | Admitting: Family

## 2022-11-28 MED ORDER — DONEPEZIL HCL 5 MG PO TABS: 5.0000 mg | ORAL_TABLET | Freq: Every day | ORAL | 1 refills | Status: DC

## 2022-11-28 NOTE — Telephone Encounter (Signed)
Spoke to patient's daughter [name] and notified of new mediation and scheduled patient for vv 01/07/23

## 2022-12-05 ENCOUNTER — Other Ambulatory Visit: Payer: Self-pay | Admitting: Pulmonary Disease

## 2022-12-11 ENCOUNTER — Telehealth: Payer: Self-pay | Admitting: Family

## 2022-12-11 NOTE — Telephone Encounter (Signed)
[name] (daughter [facility]) called stating the lexapro that the pt was put on is making the pt nauseated. [name] asked if there was any way to put her back on what she was taking.

## 2022-12-12 ENCOUNTER — Other Ambulatory Visit: Payer: Self-pay | Admitting: Family

## 2022-12-12 MED ORDER — PAROXETINE HCL 20 MG PO TABS: 20.0000 mg | ORAL_TABLET | Freq: Every day | ORAL | 0 refills | Status: DC

## 2022-12-12 NOTE — Telephone Encounter (Signed)
Spoke with pts daughter, pts daughter states the nausea started before the Aricept but "wasn't as bad as it is now". Pts daughter also requested for Paxil to be sent in today.

## 2022-12-13 NOTE — Telephone Encounter (Signed)
Spoke with pts daughter sue, she is aware Rx has been sent in and expressed understanding. Pts daughter states she believes the 20mg  will be enough.

## 2022-12-19 ENCOUNTER — Ambulatory Visit: Payer: Medicare Other | Attending: Internal Medicine | Admitting: Internal Medicine

## 2022-12-19 VITALS — BP 128/80 | HR 90 | Ht 64.0 in | Wt 244.8 lb

## 2022-12-19 DIAGNOSIS — Z950 Presence of cardiac pacemaker: Secondary | ICD-10-CM | POA: Insufficient documentation

## 2022-12-19 DIAGNOSIS — I4819 Other persistent atrial fibrillation: Secondary | ICD-10-CM | POA: Insufficient documentation

## 2022-12-19 DIAGNOSIS — I10 Essential (primary) hypertension: Secondary | ICD-10-CM | POA: Insufficient documentation

## 2022-12-19 LAB — CUP PACEART INCLINIC DEVICE CHECK
Battery Remaining Longevity: 67 mo
Battery Voltage: 2.96 V
Brady Statistic RA Percent Paced: 8.6 %
Brady Statistic RV Percent Paced: 15 %
Date Time Interrogation Session: 20240509201229
Implantable Lead Connection Status: 753985
Implantable Lead Connection Status: 753985
Implantable Lead Implant Date: 20190909
Implantable Lead Implant Date: 20190909
Implantable Lead Location: 753859
Implantable Lead Location: 753860
Implantable Lead Model: 3830
Implantable Pulse Generator Implant Date: 20190909
Lead Channel Impedance Value: 325 Ohm
Lead Channel Impedance Value: 450 Ohm
Lead Channel Pacing Threshold Amplitude: 0 V
Lead Channel Pacing Threshold Amplitude: 0.5 V
Lead Channel Pacing Threshold Amplitude: 0.5 V
Lead Channel Pacing Threshold Pulse Width: 0.5 ms
Lead Channel Pacing Threshold Pulse Width: 1 ms
Lead Channel Pacing Threshold Pulse Width: 1 ms
Lead Channel Sensing Intrinsic Amplitude: 0.5 mV
Lead Channel Sensing Intrinsic Amplitude: 6.3 mV
Lead Channel Setting Pacing Amplitude: 2 V
Lead Channel Setting Pacing Amplitude: 2.5 V
Lead Channel Setting Pacing Pulse Width: 1 ms
Lead Channel Setting Sensing Sensitivity: 0.7 mV
Pulse Gen Model: 2272
Pulse Gen Serial Number: 9061801

## 2022-12-19 MED ORDER — FUROSEMIDE 20 MG PO TABS: 20.0000 mg | ORAL_TABLET | Freq: Every day | ORAL | 3 refills | Status: DC

## 2022-12-19 MED ORDER — DIGOXIN 125 MCG PO TABS: 0.1250 mg | ORAL_TABLET | Freq: Every day | ORAL | 3 refills | Status: DC

## 2022-12-19 NOTE — Progress Notes (Signed)
HPI [name] returns today for followup. She is a pleasant 89 yo obese woman with a h/o HTN, PAF, on dofetilide, and sinus node dysfunction s/p PPM insertion. She admits to dietary indiscretion.she has gone back to atrial fib with a RVR and her dofetilide was stopped. She has worsening sob. She has not been taking her lasix regularly.  Allergies  Allergen Reactions   Penicillins Other (See Comments)    Reaction:  Unknown  Has patient had a PCN reaction causing immediate rash, facial/tongue/throat swelling, SOB or lightheadedness with hypotension: Unsure Has patient had a PCN reaction causing severe rash involving mucus membranes or skin necrosis: Unsure Has patient had a PCN reaction that required hospitalization Unsure Has patient had a PCN reaction occurring within the last 10 years: Unsure If all of the above answers are "NO", then may proceed with Cephalosporin use.    Aleve [Naproxen]    Aleve [Naproxen Sodium] Hives   Antihistamines, Chlorpheniramine-Type Rash   Aspirin Rash   Benadryl [Diphenhydramine Hcl] Palpitations and Other (See Comments)    Increases heart rate   Codeine Rash   Hydrochlorothiazide Rash   Rocephin [Ceftriaxone Sodium In Dextrose] Rash   Sulfa Antibiotics Rash     Current Outpatient Medications  Medication Sig Dispense Refill   acetaminophen (TYLENOL) 325 MG tablet Take 650 mg by mouth daily as needed for moderate pain or headache.      ALLERGY RELIEF 180 MG tablet TAKE 1 TABLET(180 MG) BY MOUTH DAILY 90 tablet 3   anti-nausea (EMETROL) solution Take 10 mLs by mouth every 15 (fifteen) minutes as needed for nausea or vomiting.     arformoterol (BROVANA) 15 MCG/2ML NEBU INHALE CONTENTS OF 1 VIAL(2MLS) VIA NEBULIZER TWICE DAILY 120 mL 11   atorvastatin (LIPITOR) 20 MG tablet TAKE 1 TABLET(20 MG) BY MOUTH AT BEDTIME 90 tablet 3   budesonide (PULMICORT) 0.5 MG/2ML nebulizer solution Use 1 vial via nebulizer twice daily 360 mL 11   Calcium 600-200  MG-UNIT tablet Take 1 tablet by mouth at bedtime.      diltiazem (CARDIZEM CD) 120 MG 24 hr capsule Take 1 capsule (120 mg total) by mouth daily. 90 capsule 2   ferrous sulfate 325 (65 FE) MG EC tablet Take 325 mg by mouth 2 (two) times daily.     fluticasone (FLONASE) 50 MCG/ACT nasal spray Place 1 spray into both nostrils daily.     furosemide (LASIX) 20 MG tablet Take 1 tablet (20 mg total) by mouth as needed. 90 tablet 3   ipratropium-albuterol (DUONEB) 0.5-2.5 (3) MG/3ML SOLN Take 3 mLs by nebulization 2 (two) times daily. 540 mL 3   levocetirizine (XYZAL) 5 MG tablet Take 1 tablet by mouth daily.     MAGnesium-Oxide 400 (240 Mg) MG tablet Take 1 tablet (400 mg total) by mouth daily. Please contact the office to schedule appointment for additional refills. 90 tablet 1   nitrofurantoin, macrocrystal-monohydrate, (MACROBID) 100 MG capsule Take 1 capsule (100 mg total) by mouth 2 (two) times daily. 14 capsule 0   Ostomy Supplies KIT 8514 is number on bag one piece unit. Colostomy bag and adhesive and alcohol prep, no sting, cannot use regular prep pad. 30 each 11   PARoxetine (PAXIL) 20 MG tablet Take 1 tablet (20 mg total) by mouth at bedtime. 90 tablet 0   revefenacin (YUPELRI) 175 MCG/3ML nebulizer solution USE 1 VIAL VIA NEBULIZER DAILY 90 mL 1   vitamin B-12 (CYANOCOBALAMIN) 1000 MCG tablet Take  1,000 mcg by mouth at bedtime.      Vitamin D, Ergocalciferol, (DRISDOL) 1.25 MG (50000 UNIT) CAPS capsule TAKE 1 CAPSULE BY MOUTH 1 TIME A WEEK 12 capsule 1   No current facility-administered medications for this visit.   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 5/2 EGD: grade A reflux esophagitis.  Mild, non-hemorrhagic gastritis.  Ablation of 3 Duodenal AVMs   Herpes zoster    History of blood transfusion    for vaginal, uterine bleeding leading to hysterectomy.  in 12/2016 transfused x 1 for GI bleed.    History of hiatal hernia    Hyperlipidemia    Hypertension    Labyrinthitis    Lung cancer (HCC)    RML   Obesity    On home oxygen therapy    "3L just at night when I sleep" (04/20/2018)   Paroxysmal supraventricular tachycardia    Peptic ulcer of stomach    hx   Pneumonia 1980s X 1; 08/2016   "walking; double"   Presence of permanent cardiac pacemaker 04/20/2018   Pulmonary embolism (HCC) 05/2014   "left lung"   Seasonal allergies    "bad" (04/20/2018)   Vitamin D deficiency     ROS:   All systems reviewed and negative except as noted in the HPI.   Past Surgical History:  Procedure Laterality Date   APPENDECTOMY  1959   BASAL CELL CARCINOMA EXCISION     nose x 2   BREAST CYST ASPIRATION Right 2016?   CATARACT EXTRACTION W/ INTRAOCULAR LENS  IMPLANT, BILATERAL Bilateral 1990s   CHOLECYSTECTOMY OPEN  1980s   COLON SURGERY     COLOSTOMY N/A 07/29/2016   Procedure: COLOSTOMY;  Surgeon: Berna Bue, MD;  Location: [facility] OR;  Service: General;  Laterality: N/A;   DILATION AND CURETTAGE OF UTERUS     ESOPHAGOGASTRODUODENOSCOPY N/A 12/11/2016   Procedure: ESOPHAGOGASTRODUODENOSCOPY (EGD);  Surgeon: [name] Fiedler, MD;  Location: [facility] ENDOSCOPY;  Service: Endoscopy;  Laterality: N/A;   EVALUATION UNDER ANESTHESIA WITH HEMORRHOIDECTOMY AND PROCTOSCOPY N/A 07/25/2016    Procedure: EXAM UNDER ANESTHESIA, EXCISION PERIANAL MASS.;  Surgeon: Jimmye Norman, MD;  Location: [facility] OR;  Service: General;  Laterality: N/A;  Prone position   FLEXIBLE SIGMOIDOSCOPY N/A 06/16/2017   Procedure: FLEXIBLE SIGMOIDOSCOPY EXAM UNDER ANESHESIA;  Surgeon: Andria Meuse, MD;  Location: [facility] ENDOSCOPY;  Service: General;  Laterality: N/A;   INSERT / REPLACE / REMOVE PACEMAKER  04/20/2018   IR RADIOLOGIST EVAL & MGMT  03/25/2017   LAPAROSCOPIC DIVERTED COLOSTOMY N/A 07/29/2016   Procedure: ATTEMPTED LAPAROSCOPIC ASSISTED COLOSTOMY;  Surgeon: Berna Bue, MD;  Location: [facility]  OR;  Service: General;  Laterality: N/A;   LUNG REMOVAL, PARTIAL Right 2013   RML mass/carcinoid, Dr Latanya Maudlin   PACEMAKER IMPLANT N/A 04/20/2018   Procedure: PACEMAKER IMPLANT;  Surgeon: Marinus Maw, MD;  Location: [facility] INVASIVE CV LAB;  Service: Cardiovascular;  Laterality: N/A;   SQUAMOUS CELL CARCINOMA EXCISION     rectum, right leg   TONSILLECTOMY AND ADENOIDECTOMY  1960s   VAGINAL HYSTERECTOMY  1959   "partial"     Family History  Problem Relation Age of Onset   Heart attack Mother        Dec 1987   CVA Mother    Hypertension Mother    Multiple myeloma Mother    Breast cancer Sister    Skin cancer Sister    Prostate cancer Brother    Throat cancer Brother    Cervical cancer Daughter    Leukemia Daughter    Leukemia Son    Throat cancer Brother    Cancer Brother        soft palette   Heart Problems Brother        Pacemaker   Colon cancer Neg Hx    Pancreatic cancer Neg Hx      Social History   Socioeconomic History   Marital status: Widowed    Spouse name: Not on file   Number of children: 5   Years of education: Not on file   Highest education level: Not on file  Occupational History   Occupation: retired  Tobacco Use   Smoking status: Former    Packs/day: 0    Types: Cigarettes   Smokeless tobacco: Never   Tobacco comments:    04/20/2018 "smoked once in awhile in the 1960s;  not more that 4 packs total in my whole life"  Vaping Use   Vaping Use: Never used  Substance and Sexual Activity   Alcohol use: Never   Drug use: Never   Sexual activity: Not Currently  Other Topics Concern   Not on file  Social History Narrative   Patient is widowed and retired she has 5 children, she lives alone and performs all of her ADLs   Former smoker, never alcohol never drug use   Social Determinants of Corporate investment banker Strain: Low Risk  (02/26/2022)   Overall Financial Resource Strain (CARDIA)    Difficulty of Paying Living Expenses: Not hard at all  Food Insecurity: No Food Insecurity (02/26/2022)   Hunger Vital Sign    Worried About Running Out of Food in the Last Year: Never true    Ran Out of Food in the Last Year: Never true  Transportation Needs: No Transportation Needs (02/26/2022)   PRAPARE - Administrator, Civil Service (Medical): No    Lack of Transportation (Non-Medical): No  Physical Activity: Inactive (02/26/2022)   Exercise Vital Sign    Days of Exercise per Week: 0 days    Minutes of Exercise per Session: 0 min  Stress: No Stress Concern Present (02/26/2022)   [employer] of Occupational Health - Occupational Stress Questionnaire    Feeling of Stress : Not at all  Social Connections: Moderately Integrated (02/26/2022)   Social Connection and Isolation Panel [NHANES]    Frequency of Communication with Friends and Family: More than three times a week    Frequency of Social Gatherings with Friends and Family: More than three times a week    Attends Religious Services: More than 4 times per year  Active Member of Clubs or Organizations: Yes    Attends Banker Meetings: More than 4 times per year    Marital Status: Widowed  Intimate Partner Violence: Not At Risk (02/26/2022)   Humiliation, Afraid, Rape, and Kick questionnaire    Fear of Current or Ex-Partner: No    Emotionally Abused: No    Physically Abused: No     Sexually Abused: No     BP 128/80   Pulse 90   Ht 5\' 4"  (1.626 m)   Wt 244 lb 12.8 oz (111 kg)   SpO2 93%   BMI 42.02 kg/m   Physical Exam:  Well appearing NAD HEENT: Unremarkable Neck:  No JVD, no thyromegally Lymphatics:  No adenopathy Back:  No CVA tenderness Lungs:  Clear with no wheezes HEART:  Regular rate rhythm, no murmurs, no rubs, no clicks Abd:  soft, positive bowel sounds, no organomegally, no rebound, no guarding Ext:  2 plus pulses, no edema, no cyanosis, no clubbing Skin:  No rashes no nodules Neuro:  CN II through XII intact, motor grossly intact  EKG - atrial fib with a RVR  DEVICE  Normal device function.  See PaceArt for details.   Assess/Plan: Atrial fib with a RVR - at this point she has failed dofetilide and I have asked her to start digoxin and we will check a level in 3 weeks. Chronic diastolic heart failure - her symtoms are class 2B. I have asked her to take lasix daily. We will check labs in a couple of weeks.  PPM - her medtronic PPM is working normally.  Obesity - she is encouraged to maintain a low fat diet.   Lewayne Bunting, MD

## 2022-12-19 NOTE — Patient Instructions (Addendum)
Medication Instructions:  Your physician has recommended you make the following change in your medication: START Taking Lasix 20 mg Daily.  You will-  Take 1 tablet (20 mg total) by mouth daily   START Taking: Digoxin 0.125 mg  You will-  Take 1 tablet (0.125 mg total) by mouth daily. Take with food or after meal; Monday through Friday ONLY; DO NOT Take Digoxin on Saturday or Sunday.  Lab Work: You will have a Digoxin level drawn and BMET drawn on 02/03/2023 prior to your appointment with Dr. Lewayne Bunting in [place].    If you have labs (blood work) drawn today and your tests are completely normal, you will receive your results only by: MyChart Message (if you have MyChart) OR A paper copy in the mail If you have any lab test that is abnormal or we need to change your treatment, we will call you to review the results.  Testing/Procedures: None ordered.  Follow-Up: At [facility], you and your health needs are our priority.  As part of our continuing mission to provide you with exceptional heart care, we have created designated Provider Care Teams.  These Care Teams include your primary Cardiologist (physician) and Advanced Practice Providers (APPs -  Physician Assistants and Nurse Practitioners) who all work together to provide you with the care you need, when you need it.  Your next appointment:   Please schedule a 3 month follow up appointment in [place];    The format for your next appointment:   In Person  Provider:   Lewayne Bunting, MD{or one of the following Advanced Practice Providers on your designated Care Team:   Francis Dowse, [place] Casimiro Needle "Mardelle Matte" Lanna Poche, [place]  Remote monitoring is used to monitor your Pacemaker from home. This monitoring reduces the number of office visits required to check your device to one time per year. It allows [place] to keep an eye on the functioning of your device to ensure it is working properly. You are scheduled for a device check from  home on 6/4. You may send your transmission at any time that day. If you have a wireless device, the transmission will be sent automatically. After your physician reviews your transmission, you will receive a postcard with your next transmission date.  Digoxin Tablets What is this medication? DIGOXIN (di JOX in) treats heart failure. It may also be used to treat a type of arrhythmia known as AFib (atrial fibrillation). It works by helping your heart beat stronger, making it easier for your heart to pump blood to the rest of the body. It also slows down overactive electric signals in the heart, which stabilizes your heart rhythm. This medicine may be used for other purposes; ask your health care provider or pharmacist if you have questions. COMMON BRAND NAME(S): Digitek, Lanoxicaps, Lanoxin What should I tell my care team before I take this medication? They need to know if you have any of these conditions: Certain heart rhythm disorders Heart disease or recent heart attack Kidney or liver disease An unusual or allergic reaction to digoxin, other medications, foods, dyes, or preservatives Pregnant or trying to get pregnant Breast-feeding How should I use this medication? Take this medication by mouth with a glass of water. Follow the directions on the prescription label. Take your doses at regular intervals. Do not take your medication more often than directed. Talk to your care team about the use of this medication in children. Special care may be needed. Overdosage: If you think  you have taken too much of this medicine contact a poison control center or emergency room at once. NOTE: This medicine is only for you. Do not share this medicine with others. What if I miss a dose? If you miss a dose, take it as soon as you can. If it is almost time for your next dose, take only that dose. Do not take double or extra doses. What may interact with this medication? Activated  charcoal Albuterol Alprazolam Antacids Antiviral medications for HIV or AIDS, such as ritonavir and saquinavir Calcium Certain antibiotics, such as azithromycin, clarithromycin, erythromycin, gentamicin, neomycin, trimethoprim, and tetracycline Certain medications for blood pressure, heart disease, irregular heartbeat Certain medications for cancer Certain medications for cholesterol, such as atorvastatin, cholestyramine, and colestipol Certain medications for diabetes, such as acarbose, exenatide, miglitol, and metformin Certain medications for fungal infections, such as ketoconazole and itraconazole Certain medications for stomach problems, such as omeprazole, esomeprazole, lansoprazole, rabeprazole, metoclopramide, and sucralfate Conivaptan Cyclosporine Diphenoxylate Epinephrine Kaolin; pectin Nefazodone NSAIDS, medications for pain and inflammation, such as celecoxib, ibuprofen, or naproxen Penicillamine Phenytoin Propantheline Quinine Phenytoin Rifampin Succinylcholine St. John's Wort Sulfasalazine Teriparatide Thyroid hormones Tolvaptan This list may not describe all possible interactions. Give your health care provider a list of all the medicines, herbs, non-prescription drugs, or dietary supplements you use. Also tell them if you smoke, drink alcohol, or use illegal drugs. Some items may interact with your medicine. What should I watch for while using this medication? Visit your care team for regular checks on your progress. Do not stop taking this medication without the advice of your care team, even if you feel better. Do not change the brand you are taking, other brands may affect you differently. Check your heart rate and blood pressure regularly while you are taking this medication. Know what your heart rate and blood pressure should be and when to contact your care team. Your care team also may schedule regular blood tests and electrocardiograms to check your  progress. Watch your diet. Less digoxin may be absorbed from the stomach if you have a diet high in bran fiber. Do not treat yourself for coughs, colds, or allergies without asking your care team for advice. Some ingredients can increase possible side effects. What side effects may I notice from receiving this medication? Side effects that you should report to your care team as soon as possible: Allergic reactions--skin rash, itching, hives, swelling of the face, lips, tongue, or throat Digoxin toxicity--confusion, loss of appetite, nausea, vomiting, diarrhea, change in vision such as blurry or yellow vision, fatigue, fast or irregular heartbeat Slow heartbeat--dizziness, feeling faint or lightheaded, confusion, trouble breathing, unusual weakness or fatigue Side effects that usually do not require medical attention (report to your care team if they continue or are bothersome): Dizziness Stomach pain Unexpected breast tissue growth This list may not describe all possible side effects. Call your doctor for medical advice about side effects. You may report side effects to FDA at 1-800-FDA-1088. Where should I keep my medication? Keep out of the reach of children. Store at room temperature between 15 and 30 degrees C (59 and 86 degrees F). Protect from light and moisture. Throw away any unused medication after the expiration date. NOTE: This sheet is a summary. It may not cover all possible information. If you have questions about this medicine, talk to your doctor, pharmacist, or health care provider.  2023 Elsevier/Gold Standard (2022-02-28 00:00:00)

## 2022-12-20 ENCOUNTER — Other Ambulatory Visit: Payer: Self-pay | Admitting: Family

## 2022-12-20 DIAGNOSIS — E782 Mixed hyperlipidemia: Secondary | ICD-10-CM

## 2023-01-07 ENCOUNTER — Telehealth: Payer: Medicare Other | Admitting: Family

## 2023-01-14 ENCOUNTER — Ambulatory Visit (INDEPENDENT_AMBULATORY_CARE_PROVIDER_SITE_OTHER): Payer: Medicare Other

## 2023-01-14 DIAGNOSIS — I495 Sick sinus syndrome: Secondary | ICD-10-CM | POA: Diagnosis not present

## 2023-01-14 LAB — CUP PACEART REMOTE DEVICE CHECK
Battery Remaining Longevity: 53 mo
Battery Remaining Percentage: 54 %
Battery Voltage: 2.93 V
Brady Statistic AP VP Percent: 6 %
Brady Statistic AP VS Percent: 11 %
Brady Statistic AS VP Percent: 35 %
Brady Statistic AS VS Percent: 28 %
Brady Statistic RA Percent Paced: 7 %
Brady Statistic RV Percent Paced: 40 %
Date Time Interrogation Session: 20240604020014
Implantable Lead Connection Status: 753985
Implantable Lead Connection Status: 753985
Implantable Lead Implant Date: 20190909
Implantable Lead Implant Date: 20190909
Implantable Lead Location: 753859
Implantable Lead Location: 753860
Implantable Lead Model: 3830
Implantable Pulse Generator Implant Date: 20190909
Lead Channel Impedance Value: 350 Ohm
Lead Channel Impedance Value: 460 Ohm
Lead Channel Pacing Threshold Amplitude: 0.5 V
Lead Channel Pacing Threshold Pulse Width: 1 ms
Lead Channel Sensing Intrinsic Amplitude: 0.5 mV
Lead Channel Sensing Intrinsic Amplitude: 9 mV
Lead Channel Setting Pacing Amplitude: 2 V
Lead Channel Setting Pacing Amplitude: 2.5 V
Lead Channel Setting Pacing Pulse Width: 1 ms
Lead Channel Setting Sensing Sensitivity: 0.7 mV
Pulse Gen Model: 2272
Pulse Gen Serial Number: 9061801

## 2023-02-03 ENCOUNTER — Other Ambulatory Visit (HOSPITAL_COMMUNITY)
Admission: RE | Admit: 2023-02-03 | Discharge: 2023-02-03 | Disposition: A | Discharge status: Home / Self Care | Payer: Medicare Other | Source: Ambulatory Visit | Attending: Internal Medicine | Admitting: Internal Medicine

## 2023-02-03 DIAGNOSIS — I10 Essential (primary) hypertension: Secondary | ICD-10-CM

## 2023-02-03 DIAGNOSIS — Z950 Presence of cardiac pacemaker: Secondary | ICD-10-CM

## 2023-02-03 DIAGNOSIS — I4819 Other persistent atrial fibrillation: Secondary | ICD-10-CM | POA: Diagnosis present

## 2023-02-03 LAB — BASIC METABOLIC PANEL
Anion gap: 8 (ref 5–15)
BUN: 16 mg/dL (ref 8–23)
CO2: 35 mmol/L — ABNORMAL HIGH (ref 22–32)
Calcium: 8.9 mg/dL (ref 8.9–10.3)
Chloride: 95 mmol/L — ABNORMAL LOW (ref 98–111)
Creatinine, Ser: 0.97 mg/dL (ref 0.44–1.00)
GFR, Estimated: 56 mL/min — ABNORMAL LOW (ref >60)
Glucose, Bld: 98 mg/dL (ref 70–99)
Potassium: 4.3 mmol/L (ref 3.5–5.1)
Sodium: 138 mmol/L (ref 135–145)

## 2023-02-03 LAB — DIGOXIN LEVEL: Digoxin Level: 0.6 ng/mL — ABNORMAL LOW (ref 0.8–2.0)

## 2023-02-10 NOTE — Progress Notes (Signed)
Remote pacemaker transmission.   

## 2023-02-21 ENCOUNTER — Other Ambulatory Visit: Payer: Self-pay

## 2023-02-21 DIAGNOSIS — C210 Malignant neoplasm of anus, unspecified: Secondary | ICD-10-CM

## 2023-02-21 DIAGNOSIS — C3490 Malignant neoplasm of unspecified part of unspecified bronchus or lung: Secondary | ICD-10-CM

## 2023-02-22 NOTE — Progress Notes (Signed)
Patient Care Team: [name], FNP as PCP - General (Internal Medicine) Marinus Maw, MD as PCP - Electrophysiology (Cardiology)  DIAGNOSIS:  Encounter Diagnosis  Name Primary?   Anal cancer (HCC) Yes    SUMMARY OF ONCOLOGIC HISTORY: Oncology History  Lung cancer (HCC)  Anal cancer (HCC)  07/25/2016 Initial Diagnosis   Perianal: Squamous cell cancer atleast 5 cm, Moderately differentiated T2N1 (lung nodules, inguinal LN unclear etiology) stage 3B vs Stage 4   08/13/2016 -  Radiation Therapy   Anal Radiation (diverting colostomy)     CHIEF COMPLIANT: Annual follow-up of perianal squamous cell carcinoma    INTERVAL HISTORY: Tricia Hernandez is a 89 y.o. with above-mentioned history of perianal squamous cell carcinoma that was treated with annual radiation. She presents to the clinic today for a follow-up. Pt reports that she has been doing good. She denies any blood in stool. She says sometimes she has some aches and pains when she gets up real early. She has been off balance. She has some fluid in feet, but has gone down as of today.   ALLERGIES:  is allergic to penicillins; aleve [naproxen]; aleve [naproxen sodium]; antihistamines, chlorpheniramine-type; aspirin; benadryl [diphenhydramine hcl]; codeine; hydrochlorothiazide; rocephin [ceftriaxone sodium in dextrose]; and sulfa antibiotics.  MEDICATIONS:  Current Outpatient Medications  Medication Sig Dispense Refill   donepezil (ARICEPT) 5 MG tablet Take 5 mg by mouth at bedtime.     acetaminophen (TYLENOL) 325 MG tablet Take 650 mg by mouth daily as needed for moderate pain or headache.      ALLERGY RELIEF 180 MG tablet TAKE 1 TABLET(180 MG) BY MOUTH DAILY 90 tablet 3   anti-nausea (EMETROL) solution Take 10 mLs by mouth every 15 (fifteen) minutes as needed for nausea or vomiting.     arformoterol (BROVANA) 15 MCG/2ML NEBU INHALE CONTENTS OF 1 VIAL(2MLS) VIA NEBULIZER TWICE DAILY 120 mL 11   atorvastatin (LIPITOR)  20 MG tablet TAKE 1 TABLET(20 MG) BY MOUTH AT BEDTIME 90 tablet 3   budesonide (PULMICORT) 0.5 MG/2ML nebulizer solution Use 1 vial via nebulizer twice daily 360 mL 11   Calcium 600-200 MG-UNIT tablet Take 1 tablet by mouth at bedtime.      digoxin (LANOXIN) 0.125 MG tablet Take 1 tablet (0.125 mg total) by mouth daily. Take with food or after meal; Monday through Friday ONLY;  DO NOT Take Digoxin on Saturday or Sunday. 90 tablet 3   diltiazem (CARDIZEM CD) 120 MG 24 hr capsule Take 1 capsule (120 mg total) by mouth daily. 90 capsule 2   ferrous sulfate 325 (65 FE) MG EC tablet Take 325 mg by mouth 2 (two) times daily.     fluticasone (FLONASE) 50 MCG/ACT nasal spray Place 1 spray into both nostrils daily.     furosemide (LASIX) 20 MG tablet Take 1 tablet (20 mg total) by mouth daily. 90 tablet 3   ipratropium-albuterol (DUONEB) 0.5-2.5 (3) MG/3ML SOLN Take 3 mLs by nebulization 2 (two) times daily. 540 mL 3   levocetirizine (XYZAL) 5 MG tablet Take 1 tablet by mouth daily.     MAGnesium-Oxide 400 (240 Mg) MG tablet Take 1 tablet (400 mg total) by mouth daily. Please contact the office to schedule appointment for additional refills. 90 tablet 1   nitrofurantoin, macrocrystal-monohydrate, (MACROBID) 100 MG capsule Take 1 capsule (100 mg total) by mouth 2 (two) times daily. 14 capsule 0   Ostomy Supplies KIT 8514 is number on bag one piece unit. Colostomy bag and  adhesive and alcohol prep, no sting, cannot use regular prep pad. 30 each 11   PARoxetine (PAXIL) 20 MG tablet Take 1 tablet (20 mg total) by mouth at bedtime. 90 tablet 0   revefenacin (YUPELRI) 175 MCG/3ML nebulizer solution USE 1 VIAL VIA NEBULIZER DAILY 90 mL 1   vitamin B-12 (CYANOCOBALAMIN) 1000 MCG tablet Take 1,000 mcg by mouth at bedtime.      Vitamin D, Ergocalciferol, (DRISDOL) 1.25 MG (50000 UNIT) CAPS capsule TAKE 1 CAPSULE BY MOUTH 1 TIME A WEEK 12 capsule 1   No current facility-administered medications for this visit.    Facility-Administered Medications Ordered in Other Visits  Medication Dose Route Frequency Provider Last Rate Last Admin   sodium phosphate (FLEET) 7-19 GM/118ML enema 2 enema  2 enema Rectal Once Andria Meuse, MD        PHYSICAL EXAMINATION: ECOG PERFORMANCE STATUS: 1 - Symptomatic but completely ambulatory  Vitals:   02/24/23 1042 02/24/23 1044  BP:    Pulse:    Resp:    Temp:    SpO2: (!) 88% 92%   Filed Weights   02/24/23 1040  Weight: 229 lb 12.8 oz (104.2 kg)      LABORATORY DATA:  I have reviewed the data as listed    Latest Ref Rng & Units 02/24/2023    9:56 AM 02/03/2023   10:03 AM 11/12/2022   11:22 AM  CMP  Glucose 70 - 99 mg/dL 96  98  90   BUN 8 - 23 mg/dL 20  16  19    Creatinine 0.44 - 1.00 mg/dL 8.29  5.62  1.30   Sodium 135 - 145 mmol/L 144  138  140   Potassium 3.5 - 5.1 mmol/L 4.2  4.3  4.9   Chloride 98 - 111 mmol/L 98  95  101   CO2 22 - 32 mmol/L 38  35  33   Calcium 8.9 - 10.3 mg/dL 9.5  8.9  9.1   Total Protein 6.5 - 8.1 g/dL 6.9   6.3   Total Bilirubin 0.3 - 1.2 mg/dL 0.6   0.5   Alkaline Phos 38 - 126 U/L 115   97   AST 15 - 41 U/L 16   17   ALT 0 - 44 U/L 7   9     Lab Results  Component Value Date   WBC 7.9 02/24/2023   HGB 12.8 02/24/2023   HCT 41.4 02/24/2023   MCV 84.7 02/24/2023   PLT 140 (L) 02/24/2023   NEUTROABS 6.3 02/24/2023    ASSESSMENT & PLAN:  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 It was unclear but the inguinal lymphadenopathy and the lung nodules could be metastatic disease. it could be a stage IIIa versus stage IV.  (Given her age we did not pursue biopsies of the lung)   Treatment summary 08/13/2016-09/25/2016 : XRT to anus S/P diverting colostomy -----------------------------------------------------------------------------------------------------------------------------------------------------   Severe anemia  from GI bleeding: Stopped after discontinuing anticoagulation CT CAP 02/01/2020: Stable pulmonary nodules no evidence of metastatic disease. Blood work was reviewed.  I do not recommend routine scans anymore at this time.  Patient is asymptomatic.   Mild anemia: Anemia due to chronic disease.  Today's hemoglobin is 12.8 Frequent falls and mobility issues: using a cane to get around.  Return to clinic in 1 year for follow-up with labs    Orders Placed This Encounter  Procedures  CBC with Differential (Cancer Center Only)    Standing Status:   Future    Standing Expiration Date:   02/24/2024   CMP (Cancer Center only)    Standing Status:   Future    Standing Expiration Date:   02/24/2024   The patient has a good understanding of the overall plan. she agrees with it. she will call with any problems that may develop before the next visit here. Total time spent: 30 mins including face to face time and time spent for planning, charting and co-ordination of care   Tamsen Meek, MD 02/24/23    I Janan Ridge am acting as a Neurosurgeon for [employer]  I have reviewed the above documentation for accuracy and completeness, and I agree with the above.

## 2023-02-24 ENCOUNTER — Other Ambulatory Visit: Payer: Self-pay

## 2023-02-24 ENCOUNTER — Inpatient Hospital Stay: Payer: Medicare Other | Attending: Hematology and Oncology

## 2023-02-24 ENCOUNTER — Inpatient Hospital Stay: Payer: Medicare Other | Admitting: Hematology and Oncology

## 2023-02-24 VITALS — BP 152/70 | HR 86 | Temp 97.6°F | Resp 18 | Ht 64.0 in | Wt 229.8 lb

## 2023-02-24 DIAGNOSIS — K922 Gastrointestinal hemorrhage, unspecified: Secondary | ICD-10-CM | POA: Insufficient documentation

## 2023-02-24 DIAGNOSIS — Z933 Colostomy status: Secondary | ICD-10-CM | POA: Insufficient documentation

## 2023-02-24 DIAGNOSIS — Z886 Allergy status to analgesic agent status: Secondary | ICD-10-CM | POA: Diagnosis not present

## 2023-02-24 DIAGNOSIS — Z79899 Other long term (current) drug therapy: Secondary | ICD-10-CM | POA: Diagnosis not present

## 2023-02-24 DIAGNOSIS — D649 Anemia, unspecified: Secondary | ICD-10-CM | POA: Insufficient documentation

## 2023-02-24 DIAGNOSIS — Z885 Allergy status to narcotic agent status: Secondary | ICD-10-CM | POA: Insufficient documentation

## 2023-02-24 DIAGNOSIS — R296 Repeated falls: Secondary | ICD-10-CM | POA: Insufficient documentation

## 2023-02-24 DIAGNOSIS — Z882 Allergy status to sulfonamides status: Secondary | ICD-10-CM | POA: Insufficient documentation

## 2023-02-24 DIAGNOSIS — R590 Localized enlarged lymph nodes: Secondary | ICD-10-CM | POA: Insufficient documentation

## 2023-02-24 DIAGNOSIS — C210 Malignant neoplasm of anus, unspecified: Secondary | ICD-10-CM | POA: Diagnosis not present

## 2023-02-24 DIAGNOSIS — Z880 Allergy status to penicillin: Secondary | ICD-10-CM | POA: Diagnosis not present

## 2023-02-24 DIAGNOSIS — C3490 Malignant neoplasm of unspecified part of unspecified bronchus or lung: Secondary | ICD-10-CM | POA: Diagnosis not present

## 2023-02-24 DIAGNOSIS — Z881 Allergy status to other antibiotic agents status: Secondary | ICD-10-CM | POA: Diagnosis not present

## 2023-02-24 DIAGNOSIS — Z888 Allergy status to other drugs, medicaments and biological substances status: Secondary | ICD-10-CM | POA: Insufficient documentation

## 2023-02-24 LAB — CMP (CANCER CENTER ONLY)
ALT: 7 U/L (ref 0–44)
AST: 16 U/L (ref 15–41)
Albumin: 4.1 g/dL (ref 3.5–5.0)
Alkaline Phosphatase: 115 U/L (ref 38–126)
Anion gap: 8 (ref 5–15)
BUN: 20 mg/dL (ref 8–23)
CO2: 38 mmol/L — ABNORMAL HIGH (ref 22–32)
Calcium: 9.5 mg/dL (ref 8.9–10.3)
Chloride: 98 mmol/L (ref 98–111)
Creatinine: 1.17 mg/dL — ABNORMAL HIGH (ref 0.44–1.00)
GFR, Estimated: 45 mL/min — ABNORMAL LOW (ref >60)
Glucose, Bld: 96 mg/dL (ref 70–99)
Potassium: 4.2 mmol/L (ref 3.5–5.1)
Sodium: 144 mmol/L (ref 135–145)
Total Bilirubin: 0.6 mg/dL (ref 0.3–1.2)
Total Protein: 6.9 g/dL (ref 6.5–8.1)

## 2023-02-24 LAB — CBC WITH DIFFERENTIAL (CANCER CENTER ONLY)
Abs Immature Granulocytes: 0.02 10*3/uL (ref 0.00–0.07)
Basophils Absolute: 0 10*3/uL (ref 0.0–0.1)
Basophils Relative: 0 %
Eosinophils Absolute: 0.1 10*3/uL (ref 0.0–0.5)
Eosinophils Relative: 1 %
HCT: 41.4 % (ref 36.0–46.0)
Hemoglobin: 12.8 g/dL (ref 12.0–15.0)
Immature Granulocytes: 0 %
Lymphocytes Relative: 12 %
Lymphs Abs: 1 10*3/uL (ref 0.7–4.0)
MCH: 26.2 pg (ref 26.0–34.0)
MCHC: 30.9 g/dL (ref 30.0–36.0)
MCV: 84.7 fL (ref 80.0–100.0)
Monocytes Absolute: 0.5 10*3/uL (ref 0.1–1.0)
Monocytes Relative: 6 %
Neutro Abs: 6.3 10*3/uL (ref 1.7–7.7)
Neutrophils Relative %: 81 %
Platelet Count: 140 10*3/uL — ABNORMAL LOW (ref 150–400)
RBC: 4.89 MIL/uL (ref 3.87–5.11)
RDW: 15.8 % — ABNORMAL HIGH (ref 11.5–15.5)
WBC Count: 7.9 10*3/uL (ref 4.0–10.5)
nRBC: 0 % (ref 0.0–0.2)

## 2023-02-24 NOTE — Assessment & Plan Note (Signed)
Squamous cell carcinoma of the anus: Patient had bilateral inguinal lymphadenopathy and bilateral lung nodules. It was unclear but the inguinal lymphadenopathy and the lung nodules could be metastatic disease. it could be a stage IIIa versus stage IV.  (Given her age we did not pursue biopsies of the lung)   Treatment summary 08/13/2016-09/25/2016 : XRT to anus S/P diverting colostomy -----------------------------------------------------------------------------------------------------------------------------------------------------   Severe anemia from GI bleeding: Stopped after discontinuing anticoagulation CT CAP 02/01/2020: Stable pulmonary nodules no evidence of metastatic disease. Blood work was reviewed.  I do not recommend routine scans anymore at this time.  Patient is asymptomatic.   Mild anemia: Anemia due to chronic disease. Frequent falls and mobility issues: using a cane to get around.  Return to clinic in 1 year for follow-up with labs

## 2023-02-26 ENCOUNTER — Telehealth: Payer: Self-pay | Admitting: Family

## 2023-02-26 ENCOUNTER — Other Ambulatory Visit: Payer: Self-pay | Admitting: Pulmonary Disease

## 2023-02-26 NOTE — Telephone Encounter (Signed)
Copied from CRM [id number]. Topic: Medicare AWV >> Feb 26, 2023 11:48 AM Payton Doughty wrote: Reason for CRM: LM 02/26/2023 to schedule AWV   Verlee Rossetti; Care Guide Ambulatory Clinical Support [place] [facility] Direct Dial: [phone]

## 2023-03-11 ENCOUNTER — Encounter: Payer: Self-pay | Admitting: Family

## 2023-03-13 ENCOUNTER — Other Ambulatory Visit: Payer: Self-pay

## 2023-03-13 ENCOUNTER — Telehealth: Payer: Self-pay | Admitting: Family

## 2023-03-13 MED ORDER — PAROXETINE HCL 20 MG PO TABS: 20.0000 mg | ORAL_TABLET | Freq: Every day | ORAL | 3 refills | Status: DC

## 2023-03-13 NOTE — Telephone Encounter (Signed)
Prescription Request  03/13/2023  Is this a "Controlled Substance" medicine? No  LOV: 11/12/2022  What is the name of the medication or equipment? PARoxetine (PAXIL) 20 MG tablet   Have you contacted your pharmacy to request a refill? Yes   Which pharmacy would you like this sent to?    [facility] [place], [place] - [address] [place] [address] Phone: [phone] Fax: [phone]    Patient notified that their request is being sent to the clinical staff for review and that they should receive a response within 2 business days.   Please advise at Mobile [phone] (mobile)   Pt's daughter called and stated per pharmacy, the pharmacist  has faxed and called to get the refill and has not gotten a reply. Per pharmacy they reached out last week. Please advise with pharmacy to refill medication.

## 2023-03-13 NOTE — Telephone Encounter (Signed)
Rx has been sent in. 

## 2023-03-13 NOTE — Telephone Encounter (Signed)
Spoke with pts daughter, pt and daughter have been made aware a 90 day refill with 3 refills has been sent in.

## 2023-03-14 ENCOUNTER — Other Ambulatory Visit: Payer: Self-pay | Admitting: Family

## 2023-03-14 MED ORDER — PAROXETINE HCL 20 MG PO TABS: 20.0000 mg | ORAL_TABLET | Freq: Every day | ORAL | 3 refills | Status: DC

## 2023-03-21 ENCOUNTER — Other Ambulatory Visit: Payer: Self-pay

## 2023-03-21 MED ORDER — VITAMIN D (ERGOCALCIFEROL) 1.25 MG (50000 UNIT) PO CAPS: ORAL_CAPSULE | ORAL | 1 refills | Status: DC

## 2023-03-21 NOTE — Telephone Encounter (Signed)
Called Pt regarding Vit D refill request. Received refill fax request for Vit D from [facility]. Vit D refilled by MD in April 2024 to [facility] in [place], [place]. Spoke with daughter who clarified that refill needs to be sent to [facility]. New refill sent. Daughter verbalized understanding.

## 2023-03-27 ENCOUNTER — Ambulatory Visit: Payer: Medicare Other | Attending: Internal Medicine | Admitting: Internal Medicine

## 2023-03-27 VITALS — BP 132/74 | HR 83 | Ht 64.0 in | Wt 237.4 lb

## 2023-03-27 DIAGNOSIS — Z79899 Other long term (current) drug therapy: Secondary | ICD-10-CM | POA: Diagnosis not present

## 2023-03-27 MED ORDER — DIGOXIN 125 MCG PO TABS: ORAL_TABLET | ORAL | 3 refills | Status: DC

## 2023-03-27 NOTE — Progress Notes (Signed)
HPI [name] returns today for followup. She is a pleasant 89 yo obese woman with a h/o HTN, PAF, on dofetilide, and sinus node dysfunction s/p PPM insertion. She admits to dietary indiscretion.she has gone back to atrial fib with a RVR and her dofetilide was stopped. She had worsening sob. She has not been taking her lasix regularly. When I saw her last I asked her to take the lasix and start digoxin. Her symptoms are improved.  Allergies  Allergen Reactions   Penicillins Other (See Comments)    Reaction:  Unknown  Has patient had a PCN reaction causing immediate rash, facial/tongue/throat swelling, SOB or lightheadedness with hypotension: Unsure Has patient had a PCN reaction causing severe rash involving mucus membranes or skin necrosis: Unsure Has patient had a PCN reaction that required hospitalization Unsure Has patient had a PCN reaction occurring within the last 10 years: Unsure If all of the above answers are "NO", then may proceed with Cephalosporin use.    Aleve [Naproxen]    Aleve [Naproxen Sodium] Hives   Antihistamines, Chlorpheniramine-Type Rash   Aspirin Rash   Benadryl [Diphenhydramine Hcl] Palpitations and Other (See Comments)    Increases heart rate   Codeine Rash   Hydrochlorothiazide Rash   Rocephin [Ceftriaxone Sodium In Dextrose] Rash   Sulfa Antibiotics Rash     Current Outpatient Medications  Medication Sig Dispense Refill   acetaminophen (TYLENOL) 325 MG tablet Take 650 mg by mouth daily as needed for moderate pain or headache.      ALLERGY RELIEF 180 MG tablet TAKE 1 TABLET(180 MG) BY MOUTH DAILY 90 tablet 3   anti-nausea (EMETROL) solution Take 10 mLs by mouth every 15 (fifteen) minutes as needed for nausea or vomiting.     arformoterol (BROVANA) 15 MCG/2ML NEBU INHALE CONTENTS OF 1 VIAL VIA NEBULIZER TWICE DAILY 120 mL 11   atorvastatin (LIPITOR) 20 MG tablet TAKE 1 TABLET(20 MG) BY MOUTH AT BEDTIME 90 tablet 3   budesonide (PULMICORT) 0.5  MG/2ML nebulizer solution USE 1 VIAL VIA NEBULIZER TWICE DAILY. 360 mL 11   Calcium 600-200 MG-UNIT tablet Take 1 tablet by mouth at bedtime.      digoxin (LANOXIN) 0.125 MG tablet Take 1 tablet (0.125 mg total) by mouth daily. Take with food or after meal; Monday through Friday ONLY;  DO NOT Take Digoxin on Saturday or Sunday. 90 tablet 3   donepezil (ARICEPT) 5 MG tablet Take 5 mg by mouth at bedtime.     ferrous sulfate 325 (65 FE) MG EC tablet Take 325 mg by mouth 2 (two) times daily.     fluticasone (FLONASE) 50 MCG/ACT nasal spray Place 1 spray into both nostrils daily.     furosemide (LASIX) 20 MG tablet Take 1 tablet (20 mg total) by mouth daily. 90 tablet 3   ipratropium-albuterol (DUONEB) 0.5-2.5 (3) MG/3ML SOLN USE 1 VIAL VIA NEBULIZER TWICE DAILY 540 mL 11   levocetirizine (XYZAL) 5 MG tablet Take 1 tablet by mouth daily.     MAGnesium-Oxide 400 (240 Mg) MG tablet Take 1 tablet (400 mg total) by mouth daily. Please contact the office to schedule appointment for additional refills. 90 tablet 1   nitrofurantoin, macrocrystal-monohydrate, (MACROBID) 100 MG capsule Take 1 capsule (100 mg total) by mouth 2 (two) times daily. 14 capsule 0   Ostomy Supplies KIT 8514 is number on bag one piece unit. Colostomy bag and adhesive and alcohol prep, no sting, cannot use regular prep pad. 30  each 11   PARoxetine (PAXIL) 20 MG tablet Take 1 tablet (20 mg total) by mouth at bedtime. 90 tablet 3   revefenacin (YUPELRI) 175 MCG/3ML nebulizer solution USE 1 VIAL VIA NEBULIZER DAILY 90 mL 1   vitamin B-12 (CYANOCOBALAMIN) 1000 MCG tablet Take 1,000 mcg by mouth at bedtime.      Vitamin D, Ergocalciferol, (DRISDOL) 1.25 MG (50000 UNIT) CAPS capsule TAKE 1 CAPSULE BY MOUTH 1 TIME A WEEK 12 capsule 1   diltiazem (CARDIZEM CD) 120 MG 24 hr capsule Take 1 capsule (120 mg total) by mouth daily. 90 capsule 2   No current facility-administered medications for this visit.   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 5/2 EGD: grade A reflux esophagitis.  Mild, non-hemorrhagic gastritis.  Ablation of 3 Duodenal AVMs   Herpes zoster    History of blood transfusion    for vaginal, uterine bleeding leading to hysterectomy.  in 12/2016 transfused x 1 for GI bleed.    History of hiatal hernia    Hyperlipidemia    Hypertension    Labyrinthitis    Lung cancer (HCC)    RML   Obesity    On home oxygen therapy    "3L just at night when I sleep" (04/20/2018)   Paroxysmal supraventricular tachycardia    Peptic ulcer of stomach    hx   Pneumonia 1980s X 1; 08/2016   "walking; double"   Presence of permanent cardiac pacemaker 04/20/2018   Pulmonary embolism (HCC) 05/2014   "left lung"   Seasonal allergies    "bad" (04/20/2018)   Vitamin D deficiency     ROS:   All systems reviewed and negative except as noted in the HPI.   Past Surgical History:  Procedure Laterality Date   APPENDECTOMY  1959   BASAL CELL CARCINOMA EXCISION     nose x 2   BREAST CYST ASPIRATION Right 2016?   CATARACT EXTRACTION W/ INTRAOCULAR LENS  IMPLANT, BILATERAL Bilateral 1990s   CHOLECYSTECTOMY OPEN  1980s   COLON SURGERY     COLOSTOMY N/A 07/29/2016   Procedure: COLOSTOMY;  Surgeon: Berna Bue, MD;  Location: [facility]  OR;  Service: General;  Laterality: N/A;   DILATION AND CURETTAGE OF UTERUS     ESOPHAGOGASTRODUODENOSCOPY N/A 12/11/2016   Procedure: ESOPHAGOGASTRODUODENOSCOPY (EGD);  Surgeon: [name] Fiedler, MD;  Location: [facility] ENDOSCOPY;  Service: Endoscopy;  Laterality: N/A;   EVALUATION UNDER ANESTHESIA WITH HEMORRHOIDECTOMY AND PROCTOSCOPY N/A 07/25/2016   Procedure: EXAM UNDER ANESTHESIA, EXCISION PERIANAL MASS.;  Surgeon: Jimmye Norman, MD;  Location: [facility] OR;  Service: General;  Laterality: N/A;  Prone position  FLEXIBLE SIGMOIDOSCOPY N/A 06/16/2017   Procedure: FLEXIBLE SIGMOIDOSCOPY EXAM UNDER ANESHESIA;  Surgeon: Andria Meuse, MD;  Location: [facility] ENDOSCOPY;  Service: General;  Laterality: N/A;   INSERT / REPLACE / REMOVE PACEMAKER  04/20/2018   IR RADIOLOGIST EVAL & MGMT  03/25/2017   LAPAROSCOPIC DIVERTED COLOSTOMY N/A 07/29/2016   Procedure: ATTEMPTED LAPAROSCOPIC ASSISTED COLOSTOMY;  Surgeon: Berna Bue, MD;  Location: [facility] OR;  Service: General;  Laterality: N/A;   LUNG REMOVAL, PARTIAL Right 2013   RML mass/carcinoid, Dr Latanya Maudlin   PACEMAKER IMPLANT N/A 04/20/2018   Procedure: PACEMAKER IMPLANT;  Surgeon: Marinus Maw, MD;  Location: [facility] INVASIVE CV LAB;  Service: Cardiovascular;  Laterality: N/A;   SQUAMOUS CELL CARCINOMA EXCISION     rectum, right leg   TONSILLECTOMY AND ADENOIDECTOMY  1960s   VAGINAL HYSTERECTOMY  1959   "partial"     Family History  Problem Relation Age of Onset   Heart attack Mother        Dec 1987   CVA Mother    Hypertension Mother    Multiple myeloma Mother    Breast cancer Sister    Skin cancer Sister    Prostate cancer Brother    Throat cancer Brother    Cervical cancer Daughter    Leukemia Daughter    Leukemia Son    Throat cancer Brother    Cancer Brother        soft palette   Heart Problems Brother        Pacemaker   Colon cancer Neg Hx    Pancreatic cancer Neg Hx      Social History   Socioeconomic History   Marital status: Widowed     Spouse name: Not on file   Number of children: 5   Years of education: Not on file   Highest education level: Not on file  Occupational History   Occupation: retired  Tobacco Use   Smoking status: Former    Types: Cigarettes   Smokeless tobacco: Never   Tobacco comments:    04/20/2018 "smoked once in awhile in the 1960s; not more that 4 packs total in my whole life"  Vaping Use   Vaping status: Never Used  Substance and Sexual Activity   Alcohol use: Never   Drug use: Never   Sexual activity: Not Currently  Other Topics Concern   Not on file  Social History Narrative   Patient is widowed and retired she has 5 children, she lives alone and performs all of her ADLs   Former smoker, never alcohol never drug use   Social Determinants of Corporate investment banker Strain: Low Risk  (02/26/2022)   Overall Financial Resource Strain (CARDIA)    Difficulty of Paying Living Expenses: Not hard at all  Food Insecurity: No Food Insecurity (02/26/2022)   Hunger Vital Sign    Worried About Running Out of Food in the Last Year: Never true    Ran Out of Food in the Last Year: Never true  Transportation Needs: No Transportation Needs (02/26/2022)   PRAPARE - Administrator, Civil Service (Medical): No    Lack of Transportation (Non-Medical): No  Physical Activity: Inactive (02/26/2022)   Exercise Vital Sign    Days of Exercise per Week: 0 days    Minutes of Exercise per Session: 0 min  Stress: No Stress Concern Present (02/26/2022)   [employer] of Occupational Health - Occupational Stress Questionnaire    Feeling of Stress :  Not at all  Social Connections: Moderately Integrated (02/26/2022)   Social Connection and Isolation Panel [NHANES]    Frequency of Communication with Friends and Family: More than three times a week    Frequency of Social Gatherings with Friends and Family: More than three times a week    Attends Religious Services: More than 4 times per year    Active  Member of [employer] or Organizations: Yes    Attends Banker Meetings: More than 4 times per year    Marital Status: Widowed  Intimate Partner Violence: Not At Risk (02/26/2022)   Humiliation, Afraid, Rape, and Kick questionnaire    Fear of Current or Ex-Partner: No    Emotionally Abused: No    Physically Abused: No    Sexually Abused: No     BP 132/74   Pulse 83   Ht 5\' 4"  (1.626 m)   Wt 237 lb 6.4 oz (107.7 kg)   SpO2 92%   BMI 40.75 kg/m   Physical Exam:  Well appearing elderly woman, NAD HEENT: Unremarkable Neck:  No JVD, no thyromegally Lymphatics:  No adenopathy Back:  No CVA tenderness Lungs:  Clear HEART:  Regular rate rhythm, no murmurs, no rubs, no clicks Abd:  soft, positive bowel sounds, no organomegally, no rebound, no guarding Ext:  2 plus pulses, no edema, no cyanosis, no clubbing Skin:  No rashes no nodules Neuro:  CN II through XII intact, motor grossly intact  Assess/Plan: Atrial fib with a RVR - on digoxin her symptoms are improved. No change in meds. She is not a candidate for blood thinners due to a h/o GI bleeding. Diastolic heart failure - her symptoms remain class 2. She will continue her current meds.  Sharlot Gowda ,MD

## 2023-03-27 NOTE — Patient Instructions (Signed)
Medication Instructions:   Take Digoxin 0.125 mg Daily Monday -Saturday none on Sunday   *If you need a refill on your cardiac medications before your next appointment, please call your pharmacy*   Lab Work: Your physician recommends that you return for lab work in: 1 Month ( September 16)   If you have labs (blood work) drawn today and your tests are completely normal, you will receive your results only by: MyChart Message (if you have MyChart) OR A paper copy in the mail If you have any lab test that is abnormal or we need to change your treatment, we will call you to review the results.   Testing/Procedures: NONE    Follow-Up: At [facility], you and your health needs are our priority.  As part of our continuing mission to provide you with exceptional heart care, we have created designated Provider Care Teams.  These Care Teams include your primary Cardiologist (physician) and Advanced Practice Providers (APPs -  Physician Assistants and Nurse Practitioners) who all work together to provide you with the care you need, when you need it.  We recommend signing up for the patient portal called "MyChart".  Sign up information is provided on this After Visit Summary.  MyChart is used to connect with patients for Virtual Visits (Telemedicine).  Patients are able to view lab/test results, encounter notes, upcoming appointments, etc.  Non-urgent messages can be sent to your provider as well.   To learn more about what you can do with MyChart, go to [email or web address].    Your next appointment:    April   Provider:   Lewayne Bunting, MD    Other Instructions Thank you for choosing [facility]!

## 2023-03-31 ENCOUNTER — Encounter: Payer: Self-pay | Admitting: Pulmonary Disease

## 2023-03-31 ENCOUNTER — Ambulatory Visit (INDEPENDENT_AMBULATORY_CARE_PROVIDER_SITE_OTHER): Payer: Medicare Other | Admitting: Pulmonary Disease

## 2023-03-31 VITALS — BP 112/60 | HR 84 | Ht 64.0 in | Wt 236.6 lb

## 2023-03-31 DIAGNOSIS — J4489 Other specified chronic obstructive pulmonary disease: Secondary | ICD-10-CM | POA: Diagnosis not present

## 2023-03-31 DIAGNOSIS — R918 Other nonspecific abnormal finding of lung field: Secondary | ICD-10-CM | POA: Diagnosis not present

## 2023-03-31 DIAGNOSIS — J439 Emphysema, unspecified: Secondary | ICD-10-CM

## 2023-03-31 NOTE — Patient Instructions (Signed)
I am glad you are doing well with regard to your breathing Continue your inhalers Will get a CT chest without contrast for evaluation of lung nodules Follow-up in 1 year

## 2023-03-31 NOTE — Progress Notes (Signed)
Tricia Hernandez    [id number]    1930-11-17  Primary Care Physician:Murray, Allyne Gee, FNP  Referring Physician: [name], FNP [address] [place],  [place] [address]  Chief complaint:   Follow up of  COPD Lung nodules. S/p Lung resection for carcinoid-2013 H/o PE, on eliquis  HPI: Mrs. 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 She used to follow Dr. Flora Lipps, Pulmonologist at [place], [place].   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 Work up included CT scan of the chest and PET scan which shows non avid stable lung nodules. Has H/O uncontrolled atrial fibrillation and loaded with Tikosyn. Also has sick sinus rhythm s/p PPM insertion in 2019.    Her anticoagulation was held in 2019 due GI bleed after discussion with Dr. Leone Payor, GI and Dr. Pamelia Hoit, Oncology. She has been evaluated by IR for IVC filter. This has been deferred as she did not have any DVT or evidence of hypercoagulability.  She has a remote smoking history of about 5 pack years and exposure to secondhand smoke. Quit smoking in 1975. 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 She has been exposed to a lot of dust and fibre inhalation during her work in [employer].   Interim history: She is doing well with no issues Complains of degenerative arthritis of the knee and knee pain but not a candidate for total knee replacement given comorbidities Continues on Brovana, Pulmicort and Yupelri nebs.  She is using walker now due to instability of gait.  Complains of generalized joint pain and rib pain  Outpatient Encounter Medications as of 03/31/2023  Medication Sig   acetaminophen (TYLENOL) 325 MG tablet Take 650 mg by mouth daily as needed for moderate  pain or headache.    ALLERGY RELIEF 180 MG tablet TAKE 1 TABLET(180 MG) BY MOUTH DAILY   anti-nausea (EMETROL) solution Take 10 mLs by mouth every 15 (fifteen) minutes as needed for nausea or vomiting.   arformoterol (BROVANA) 15 MCG/2ML NEBU INHALE CONTENTS OF 1 VIAL VIA NEBULIZER TWICE DAILY   atorvastatin (LIPITOR) 20 MG tablet TAKE 1 TABLET(20 MG) BY MOUTH AT BEDTIME   budesonide (PULMICORT) 0.5 MG/2ML nebulizer solution USE 1 VIAL VIA NEBULIZER TWICE DAILY.   Calcium 600-200 MG-UNIT tablet Take 1 tablet by mouth at bedtime.    digoxin (LANOXIN) 0.125 MG tablet Take 1 Tablet Daily Monday -Saturday None on Sunday   diltiazem (CARDIZEM CD) 120 MG 24 hr capsule Take 1 capsule (120 mg total) by mouth daily.   ferrous sulfate 325 (65 FE) MG EC tablet Take 325 mg by mouth 2 (two) times daily.   fluticasone (FLONASE) 50 MCG/ACT nasal spray Place 1 spray into both nostrils daily.   furosemide (LASIX) 20 MG tablet Take 1 tablet (20 mg total) by mouth daily.   ipratropium-albuterol (DUONEB) 0.5-2.5 (3) MG/3ML SOLN USE 1 VIAL VIA NEBULIZER TWICE DAILY   levocetirizine (XYZAL) 5 MG tablet Take 1 tablet by mouth daily.   MAGnesium-Oxide 400 (240 Mg) MG tablet Take 1 tablet (400 mg total) by mouth daily. Please contact the office to schedule appointment for additional refills.   PARoxetine (PAXIL) 20 MG tablet Take 1 tablet (20 mg total) by  mouth at bedtime.   revefenacin (YUPELRI) 175 MCG/3ML nebulizer solution USE 1 VIAL VIA NEBULIZER DAILY   vitamin B-12 (CYANOCOBALAMIN) 1000 MCG tablet Take 1,000 mcg by mouth at bedtime.    Vitamin D, Ergocalciferol, (DRISDOL) 1.25 MG (50000 UNIT) CAPS capsule TAKE 1 CAPSULE BY MOUTH 1 TIME A WEEK   nitrofurantoin, macrocrystal-monohydrate, (MACROBID) 100 MG capsule Take 1 capsule (100 mg total) by mouth 2 (two) times daily. (Patient not taking: Reported on 03/31/2023)   [DISCONTINUED] donepezil (ARICEPT) 5 MG tablet Take 5 mg by mouth at bedtime.   [DISCONTINUED]  Ostomy Supplies KIT 8514 is number on bag one piece unit. Colostomy bag and adhesive and alcohol prep, no sting, cannot use regular prep pad. (Patient not taking: Reported on 03/31/2023)   Facility-Administered Encounter Medications as of 03/31/2023  Medication   sodium phosphate (FLEET) 7-19 GM/118ML enema 2 enema   Physical Exam: Blood pressure 112/60, pulse 84, height 5\' 4"  (1.626 m), weight 236 lb 9.6 oz (107.3 kg), SpO2 96%. Gen:      No acute distress HEENT:  EOMI, sclera anicteric Neck:     No masses; no thyromegaly Lungs:    Clear to auscultation bilaterally; normal respiratory effort CV:         Regular rate and rhythm; no murmurs Abd:      + bowel sounds; soft, non-tender; no palpable masses, no distension Ext:    No edema; adequate peripheral perfusion Skin:      Warm and dry; no rash Neuro: alert and oriented x 3 Psych: normal mood and affect   Data Reviewed: Imaging: CT chest 07/25/16-postsurgical changes in right middle lung, multiple subcentimeter pulmonary nodules. CTA chest 08/27/16-no pulmonary embolus, stable postsurgical changes, multiple subcentimeter pulmonary nodules. Consolidative changes in bilateral lungs, small bilateral pleural effusions. PET scan 09/16/16- resolution of consolidative changes, stable postsurgical changes, stable subcentimeter pulmonary nodules which are non-PET avid CT chest abdomen pelvis 12/30/2017- no evidence of recurrent metastatic disease in the abdomen or pelvis.  New pulmonary nodules in the right lower lobe.  Previously visualized pulmonary nodules are stable. CT chest abdomen pelvis 02/08/2019- no evidence of recurrent cancer.  Lung nodules are stable. CT chest abdomen pelvis 02/01/2020- no evidence of recurrent cancer.  Lung nodules are stable. I have reviewed all the images personally  PFTs 02/25/17 FVC 1.43 [58%), FEV1 0.82 (45%), F/F 57, TLC 293%, RV/TLC 188% Severe obstruction with bronchodilator response,  hyperinflation  Labs: Alpha-1 antitrypsin 05/27/2017-141, PI MM  Assessment:  Severe COPD PFTs show severe obstruction with hyperinflation and bronchodilator response (as shown by improvement in FVC].  Stable on Brovana, Pulmicort, Yupelri, Duo nebs to as needed Continue supplemental oxygen during the nighttime.  Pulmonary nodules H/O lung carcinoid s/p resection Repeat CT chest without contrast  History of anal cancer Surveillance CTs per oncology  Plan/Recommendations: - Continue brovana, pulmicort, add Yupelri,  - Duonebs as needed -CT chest without contrast  Follow-up in 1 year  Chilton Greathouse MD [facility] 03/31/2023, 9:17 AM  CC: [name],*

## 2023-04-08 ENCOUNTER — Ambulatory Visit
Admission: RE | Admit: 2023-04-08 | Discharge: 2023-04-08 | Disposition: A | Discharge status: Home / Self Care | Payer: Medicare Other | Source: Ambulatory Visit | Attending: Pulmonary Disease | Admitting: Pulmonary Disease

## 2023-04-08 DIAGNOSIS — R918 Other nonspecific abnormal finding of lung field: Secondary | ICD-10-CM

## 2023-04-15 ENCOUNTER — Ambulatory Visit (INDEPENDENT_AMBULATORY_CARE_PROVIDER_SITE_OTHER): Payer: Medicare Other

## 2023-04-15 DIAGNOSIS — I495 Sick sinus syndrome: Secondary | ICD-10-CM

## 2023-04-16 LAB — CUP PACEART REMOTE DEVICE CHECK
Battery Remaining Longevity: 48 mo
Battery Remaining Percentage: 49 %
Battery Voltage: 2.93 V
Brady Statistic AP VP Percent: 9 %
Brady Statistic AP VS Percent: 6.9 %
Brady Statistic AS VP Percent: 50 %
Brady Statistic AS VS Percent: 17 %
Brady Statistic RA Percent Paced: 7 %
Brady Statistic RV Percent Paced: 44 %
Date Time Interrogation Session: 20240903020014
Implantable Lead Connection Status: 753985
Implantable Lead Connection Status: 753985
Implantable Lead Implant Date: 20190909
Implantable Lead Implant Date: 20190909
Implantable Lead Location: 753859
Implantable Lead Location: 753860
Implantable Lead Model: 3830
Implantable Pulse Generator Implant Date: 20190909
Lead Channel Impedance Value: 350 Ohm
Lead Channel Impedance Value: 490 Ohm
Lead Channel Pacing Threshold Amplitude: 0.5 V
Lead Channel Pacing Threshold Pulse Width: 1 ms
Lead Channel Sensing Intrinsic Amplitude: 0.5 mV
Lead Channel Sensing Intrinsic Amplitude: 4.8 mV
Lead Channel Setting Pacing Amplitude: 2 V
Lead Channel Setting Pacing Amplitude: 2.5 V
Lead Channel Setting Pacing Pulse Width: 1 ms
Lead Channel Setting Sensing Sensitivity: 0.7 mV
Pulse Gen Model: 2272
Pulse Gen Serial Number: 9061801

## 2023-04-17 ENCOUNTER — Other Ambulatory Visit: Payer: Self-pay | Admitting: Pulmonary Disease

## 2023-04-17 DIAGNOSIS — R918 Other nonspecific abnormal finding of lung field: Secondary | ICD-10-CM

## 2023-04-18 ENCOUNTER — Telehealth: Payer: Self-pay

## 2023-04-18 NOTE — Telephone Encounter (Signed)
Pt's daughter, [name] called to make [place] aware of most recent CT which shows possible lung ca recurrence. Dr Isaiah Serge has ordered a PET scan to further evaluate.   Ernst Spell I would reach out to our lung navigator to assist in establishing care with Dr Arbutus Ped to further assess.  Message sent to Alejandro Mulling, RN navigator.

## 2023-04-28 ENCOUNTER — Other Ambulatory Visit: Payer: Self-pay | Admitting: Pulmonary Disease

## 2023-04-28 NOTE — Progress Notes (Signed)
Remote pacemaker transmission.   

## 2023-04-30 ENCOUNTER — Encounter (HOSPITAL_COMMUNITY)
Admission: RE | Admit: 2023-04-30 | Discharge: 2023-04-30 | Disposition: A | Discharge status: Home / Self Care | Payer: Medicare Other | Source: Ambulatory Visit | Attending: Pulmonary Disease | Admitting: Pulmonary Disease

## 2023-04-30 ENCOUNTER — Telehealth: Payer: Self-pay | Admitting: Internal Medicine

## 2023-04-30 DIAGNOSIS — R918 Other nonspecific abnormal finding of lung field: Secondary | ICD-10-CM | POA: Diagnosis present

## 2023-04-30 LAB — GLUCOSE, CAPILLARY: Glucose-Capillary: 95 mg/dL (ref 70–99)

## 2023-04-30 MED ORDER — FLUDEOXYGLUCOSE F - 18 (FDG) INJECTION
11.7000 | Freq: Once | INTRAVENOUS | Status: AC
  Administered 2023-04-30: 11.7 via INTRAVENOUS

## 2023-04-30 NOTE — Telephone Encounter (Signed)
Scheduled appointments per 9/18 secure chat. Talked with the patients daughter and she is aware of the made appointments.

## 2023-05-02 ENCOUNTER — Other Ambulatory Visit: Payer: Self-pay | Admitting: *Deleted

## 2023-05-02 DIAGNOSIS — C3400 Malignant neoplasm of unspecified main bronchus: Secondary | ICD-10-CM

## 2023-05-07 ENCOUNTER — Inpatient Hospital Stay: Payer: Medicare Other | Attending: Hematology and Oncology

## 2023-05-07 ENCOUNTER — Inpatient Hospital Stay (HOSPITAL_BASED_OUTPATIENT_CLINIC_OR_DEPARTMENT_OTHER): Payer: Medicare Other | Admitting: Internal Medicine

## 2023-05-07 VITALS — BP 128/65 | HR 80 | Temp 98.3°F | Resp 16 | Ht 64.0 in | Wt 235.7 lb

## 2023-05-07 DIAGNOSIS — Z85118 Personal history of other malignant neoplasm of bronchus and lung: Secondary | ICD-10-CM

## 2023-05-07 DIAGNOSIS — Z79899 Other long term (current) drug therapy: Secondary | ICD-10-CM | POA: Diagnosis not present

## 2023-05-07 DIAGNOSIS — M25511 Pain in right shoulder: Secondary | ICD-10-CM | POA: Diagnosis not present

## 2023-05-07 DIAGNOSIS — M549 Dorsalgia, unspecified: Secondary | ICD-10-CM | POA: Diagnosis not present

## 2023-05-07 DIAGNOSIS — C210 Malignant neoplasm of anus, unspecified: Secondary | ICD-10-CM | POA: Insufficient documentation

## 2023-05-07 DIAGNOSIS — R079 Chest pain, unspecified: Secondary | ICD-10-CM | POA: Diagnosis not present

## 2023-05-07 DIAGNOSIS — Z85828 Personal history of other malignant neoplasm of skin: Secondary | ICD-10-CM

## 2023-05-07 DIAGNOSIS — C3490 Malignant neoplasm of unspecified part of unspecified bronchus or lung: Secondary | ICD-10-CM | POA: Diagnosis not present

## 2023-05-07 DIAGNOSIS — Z85048 Personal history of other malignant neoplasm of rectum, rectosigmoid junction, and anus: Secondary | ICD-10-CM

## 2023-05-07 LAB — CMP (CANCER CENTER ONLY)
ALT: 7 U/L (ref 0–44)
AST: 14 U/L — ABNORMAL LOW (ref 15–41)
Albumin: 3.9 g/dL (ref 3.5–5.0)
Alkaline Phosphatase: 100 U/L (ref 38–126)
Anion gap: 7 (ref 5–15)
BUN: 18 mg/dL (ref 8–23)
CO2: 35 mmol/L — ABNORMAL HIGH (ref 22–32)
Calcium: 9.1 mg/dL (ref 8.9–10.3)
Chloride: 99 mmol/L (ref 98–111)
Creatinine: 1.23 mg/dL — ABNORMAL HIGH (ref 0.44–1.00)
GFR, Estimated: 42 mL/min — ABNORMAL LOW (ref >60)
Glucose, Bld: 120 mg/dL — ABNORMAL HIGH (ref 70–99)
Potassium: 4.5 mmol/L (ref 3.5–5.1)
Sodium: 141 mmol/L (ref 135–145)
Total Bilirubin: 0.4 mg/dL (ref 0.3–1.2)
Total Protein: 6.5 g/dL (ref 6.5–8.1)

## 2023-05-07 LAB — CBC WITH DIFFERENTIAL (CANCER CENTER ONLY)
Abs Immature Granulocytes: 0.03 10*3/uL (ref 0.00–0.07)
Basophils Absolute: 0 10*3/uL (ref 0.0–0.1)
Basophils Relative: 0 %
Eosinophils Absolute: 0.1 10*3/uL (ref 0.0–0.5)
Eosinophils Relative: 2 %
HCT: 35.8 % — ABNORMAL LOW (ref 36.0–46.0)
Hemoglobin: 11.1 g/dL — ABNORMAL LOW (ref 12.0–15.0)
Immature Granulocytes: 0 %
Lymphocytes Relative: 9 %
Lymphs Abs: 0.7 10*3/uL (ref 0.7–4.0)
MCH: 27 pg (ref 26.0–34.0)
MCHC: 31 g/dL (ref 30.0–36.0)
MCV: 87.1 fL (ref 80.0–100.0)
Monocytes Absolute: 0.5 10*3/uL (ref 0.1–1.0)
Monocytes Relative: 6 %
Neutro Abs: 6.5 10*3/uL (ref 1.7–7.7)
Neutrophils Relative %: 83 %
Platelet Count: 158 10*3/uL (ref 150–400)
RBC: 4.11 MIL/uL (ref 3.87–5.11)
RDW: 18.1 % — ABNORMAL HIGH (ref 11.5–15.5)
WBC Count: 7.9 10*3/uL (ref 4.0–10.5)
nRBC: 0 % (ref 0.0–0.2)

## 2023-05-07 NOTE — Progress Notes (Signed)
[facility] [phone]   [phone]  CONSULT NOTE  REFERRING PHYSICIAN: Dr. Chilton Greathouse  REASON FOR CONSULTATION:  89 years old white female with highly suspicious recurrent lung cancer.  HPI Tricia Hernandez is a 89 y.o. female comes to the clinic today to establish care with me and she was accompanied by her daughter Tricia Hernandez. Discussed the use of AI scribe software for clinical note transcription with the patient, who gave verbal consent to proceed.  History of Present Illness   Tricia Hernandez, an 89 year old individual with a history of anal cancer treated with radiation and surgery in 2017, presents with right-sided chest pain radiating to the back and right ribs. The pain, which started in March, is not associated with shortness of breath, cough, or hemoptysis. CT scan of the chest on April 08, 2023 showed pleural thickening and nodularity in the right hemothorax largely new from the previous scan on 02/01/2020 and highly worrisome for recurrent congenic carcinoma.  She had a PET scan on 04/30/2023 but the final report is still pending and on review of the scan images I see significant hypermetabolic activity in the pleural thickening and nodularity in the right hemothorax but I will wait for the final report for confirmation. The patient denies any recent nausea, vomiting, diarrhea, or constipation. She reports a slight weight loss but the exact amount is unknown. The patient also mentions chronic dizziness and ringing in the ears, leading to occasional imbalance. In addition to the anal cancer, the patient has a history of lung cancer treated with right middle lobe resection in 2013. She also has a basal cell carcinoma on the nose.  The patient has a significant family history of cancer, with siblings having diagnoses of prostate, throat, and skin cancers, and a daughter and son with chronic lymphocytic leukemia (CLL). - Mother had multiple  myeloma - Sister had melanoma of the heel - Sister had breast cancer - Brother had prostate cancer - Brother had throat cancer - Brother had soft palate cancer - Brother had leukemia - Daughter had CLL - Son had CLL  The patient is a former smoker, having quit in 1976, and denies any alcohol or drug use. 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        Past Medical History:  Diagnosis Date   Anal squamous cell carcinoma (HCC) 07/2016   "spread to lymph nodes and groins"    Anemia    Anxiety    Atrial fibrillation (HCC)    Basal cell carcinoma of skin of nose    CHF (congestive heart failure) (HCC) 11/2015   Chronic bronchitis (HCC)    Chronic depression    Colostomy in place [facility]) 07/2016   Diverticular disease    Fibrocystic disease of breast    Fracture of shoulder    GERD (gastroesophageal reflux disease)    GI bleed 12/10/2016   in setting of no PPI. 5/2 EGD: grade A reflux esophagitis.  Mild, non-hemorrhagic gastritis.  Ablation of 3 Duodenal AVMs   Herpes zoster    History of blood transfusion    for vaginal, uterine bleeding leading to hysterectomy.  in 12/2016 transfused x 1 for GI bleed.    History of hiatal hernia    Hyperlipidemia    Hypertension    Labyrinthitis    Lung cancer (HCC)    RML   Obesity  On home oxygen therapy    "3L just at night when I sleep" (04/20/2018)   Paroxysmal supraventricular tachycardia    Peptic ulcer of stomach    hx   Pneumonia 1980s X 1; 08/2016   "walking; double"   Presence of permanent cardiac pacemaker 04/20/2018   Pulmonary embolism (HCC) 05/2014   "left lung"   Seasonal allergies    "bad" (04/20/2018)   Vitamin D deficiency     Past Surgical History:  Procedure Laterality Date   APPENDECTOMY  1959   BASAL CELL CARCINOMA EXCISION     nose x 2   BREAST CYST ASPIRATION Right 2016?   CATARACT EXTRACTION W/ INTRAOCULAR LENS  IMPLANT, BILATERAL  Bilateral 1990s   CHOLECYSTECTOMY OPEN  1980s   COLON SURGERY     COLOSTOMY N/A 07/29/2016   Procedure: COLOSTOMY;  Surgeon: Berna Bue, MD;  Location: [facility] OR;  Service: General;  Laterality: N/A;   DILATION AND CURETTAGE OF UTERUS     ESOPHAGOGASTRODUODENOSCOPY N/A 12/11/2016   Procedure: ESOPHAGOGASTRODUODENOSCOPY (EGD);  Surgeon: [name] Fiedler, MD;  Location: [facility] ENDOSCOPY;  Service: Endoscopy;  Laterality: N/A;   EVALUATION UNDER ANESTHESIA WITH HEMORRHOIDECTOMY AND PROCTOSCOPY N/A 07/25/2016   Procedure: EXAM UNDER ANESTHESIA, EXCISION PERIANAL MASS.;  Surgeon: Jimmye Norman, MD;  Location: [facility] OR;  Service: General;  Laterality: N/A;  Prone position   FLEXIBLE SIGMOIDOSCOPY N/A 06/16/2017   Procedure: FLEXIBLE SIGMOIDOSCOPY EXAM UNDER ANESHESIA;  Surgeon: Andria Meuse, MD;  Location: [facility];  Service: General;  Laterality: N/A;   INSERT / REPLACE / REMOVE PACEMAKER  04/20/2018   IR RADIOLOGIST EVAL & MGMT  03/25/2017   LAPAROSCOPIC DIVERTED COLOSTOMY N/A 07/29/2016   Procedure: ATTEMPTED LAPAROSCOPIC ASSISTED COLOSTOMY;  Surgeon: Berna Bue, MD;  Location: [facility] OR;  Service: General;  Laterality: N/A;   LUNG REMOVAL, PARTIAL Right 2013   RML mass/carcinoid, Dr Latanya Maudlin   PACEMAKER IMPLANT N/A 04/20/2018   Procedure: PACEMAKER IMPLANT;  Surgeon: Marinus Maw, MD;  Location: [facility] INVASIVE CV LAB;  Service: Cardiovascular;  Laterality: N/A;   SQUAMOUS CELL CARCINOMA EXCISION     rectum, right leg   TONSILLECTOMY AND ADENOIDECTOMY  1960s   VAGINAL HYSTERECTOMY  1959   "partial"    Family History  Problem Relation Age of Onset   Heart attack Mother        Dec 1987   CVA Mother    Hypertension Mother    Multiple myeloma Mother    Breast cancer Sister    Skin cancer Sister    Prostate cancer Brother    Throat cancer Brother    Cervical cancer Daughter    Leukemia Daughter    Leukemia Son    Throat cancer Brother    Cancer Brother        soft palette   Heart  Problems Brother        Pacemaker   Colon cancer Neg Hx    Pancreatic cancer Neg Hx     Social History Social History   Tobacco Use   Smoking status: Former    Types: Cigarettes   Smokeless tobacco: Never   Tobacco comments:    04/20/2018 "smoked once in awhile in the 1960s; not more that 4 packs total in my whole life"  Vaping Use   Vaping status: Never Used  Substance Use Topics   Alcohol use: Never   Drug use: Never    Allergies  Allergen Reactions   Penicillins Other (See Comments)  Reaction:  Unknown  Has patient had a PCN reaction causing immediate rash, facial/tongue/throat swelling, SOB or lightheadedness with hypotension: Unsure Has patient had a PCN reaction causing severe rash involving mucus membranes or skin necrosis: Unsure Has patient had a PCN reaction that required hospitalization Unsure Has patient had a PCN reaction occurring within the last 10 years: Unsure If all of the above answers are "NO", then may proceed with Cephalosporin use.    Aleve [Naproxen]    Aleve [Naproxen Sodium] Hives   Antihistamines, Chlorpheniramine-Type Rash   Aspirin Rash   Benadryl [Diphenhydramine Hcl] Palpitations and Other (See Comments)    Increases heart rate   Codeine Rash   Hydrochlorothiazide Rash   Rocephin [Ceftriaxone Sodium In Dextrose] Rash   Sulfa Antibiotics Rash    Current Outpatient Medications  Medication Sig Dispense Refill   acetaminophen (TYLENOL) 325 MG tablet Take 650 mg by mouth daily as needed for moderate pain or headache.      ALLERGY RELIEF 180 MG tablet TAKE 1 TABLET(180 MG) BY MOUTH DAILY 90 tablet 3   anti-nausea (EMETROL) solution Take 10 mLs by mouth every 15 (fifteen) minutes as needed for nausea or vomiting.     arformoterol (BROVANA) 15 MCG/2ML NEBU INHALE CONTENTS OF 1 VIAL VIA NEBULIZER TWICE DAILY 120 mL 11   atorvastatin (LIPITOR) 20 MG tablet TAKE 1 TABLET(20 MG) BY MOUTH AT BEDTIME 90 tablet 3   budesonide (PULMICORT) 0.5 MG/2ML  nebulizer solution USE 1 VIAL VIA NEBULIZER TWICE DAILY. 360 mL 11   Calcium 600-200 MG-UNIT tablet Take 1 tablet by mouth at bedtime.      digoxin (LANOXIN) 0.125 MG tablet Take 1 Tablet Daily Monday -Saturday None on Sunday 90 tablet 3   diltiazem (CARDIZEM CD) 120 MG 24 hr capsule Take 1 capsule (120 mg total) by mouth daily. 90 capsule 2   ferrous sulfate 325 (65 FE) MG EC tablet Take 325 mg by mouth 2 (two) times daily.     fluticasone (FLONASE) 50 MCG/ACT nasal spray Place 1 spray into both nostrils daily.     furosemide (LASIX) 20 MG tablet Take 1 tablet (20 mg total) by mouth daily. 90 tablet 3   ipratropium-albuterol (DUONEB) 0.5-2.5 (3) MG/3ML SOLN USE 1 VIAL VIA NEBULIZER TWICE DAILY 540 mL 11   levocetirizine (XYZAL) 5 MG tablet Take 1 tablet by mouth daily.     MAGnesium-Oxide 400 (240 Mg) MG tablet Take 1 tablet (400 mg total) by mouth daily. Please contact the office to schedule appointment for additional refills. 90 tablet 1   nitrofurantoin, macrocrystal-monohydrate, (MACROBID) 100 MG capsule Take 1 capsule (100 mg total) by mouth 2 (two) times daily. (Patient not taking: Reported on 03/31/2023) 14 capsule 0   PARoxetine (PAXIL) 20 MG tablet Take 1 tablet (20 mg total) by mouth at bedtime. 90 tablet 3   vitamin B-12 (CYANOCOBALAMIN) 1000 MCG tablet Take 1,000 mcg by mouth at bedtime.      Vitamin D, Ergocalciferol, (DRISDOL) 1.25 MG (50000 UNIT) CAPS capsule TAKE 1 CAPSULE BY MOUTH 1 TIME A WEEK 12 capsule 1   YUPELRI 175 MCG/3ML nebulizer solution USE 1 VIAL VIA NEBULIZER DAILY 90 mL 1   No current facility-administered medications for this visit.   Facility-Administered Medications Ordered in Other Visits  Medication Dose Route Frequency Provider Last Rate Last Admin   sodium phosphate (FLEET) 7-19 GM/118ML enema 2 enema  2 enema Rectal Once Andria Meuse, MD        Review of  Systems  Constitutional: positive for fatigue and weight loss Eyes: negative Ears, nose,  mouth, throat, and face: negative Respiratory: positive for dyspnea on exertion and pleurisy/chest pain Cardiovascular: negative Gastrointestinal: negative Genitourinary:negative Integument/breast: negative Hematologic/lymphatic: negative Musculoskeletal:positive for muscle weakness Neurological: negative Behavioral/Psych: negative Endocrine: negative Allergic/Immunologic: negative  Physical Exam  DGL:OVFIE, healthy, no distress, well nourished, and well developed SKIN: skin color, texture, turgor are normal, no rashes or significant lesions HEAD: Normocephalic, No masses, lesions, tenderness or abnormalities EYES: normal, PERRLA, Conjunctiva are pink and non-injected EARS: External ears normal, Canals clear OROPHARYNX:no exudate, no erythema, and lips, buccal mucosa, and tongue normal  NECK: supple, no adenopathy, no JVD LYMPH:  no palpable lymphadenopathy, no hepatosplenomegaly BREAST:not examined LUNGS: clear to auscultation , and palpation HEART: regular rate & rhythm, no murmurs, and no gallops ABDOMEN:abdomen soft, non-tender, obese, normal bowel sounds, and no masses or organomegaly BACK: Back symmetric, no curvature., No CVA tenderness EXTREMITIES:no joint deformities, effusion, or inflammation, no edema  NEURO: alert & oriented x 3 with fluent speech, no focal motor/sensory deficits  PERFORMANCE STATUS: ECOG 1  LABORATORY DATA: Lab Results  Component Value Date   WBC 7.9 05/07/2023   HGB 11.1 (L) 05/07/2023   HCT 35.8 (L) 05/07/2023   MCV 87.1 05/07/2023   PLT 158 05/07/2023      Chemistry      Component Value Date/Time   NA 141 05/07/2023 1334   NA 142 05/07/2022 1010   NA 144 12/30/2016 0850   K 4.5 05/07/2023 1334   K 4.1 12/30/2016 0850   CL 99 05/07/2023 1334   CO2 35 (H) 05/07/2023 1334   CO2 33 (H) 12/30/2016 0850   BUN 18 05/07/2023 1334   BUN 18 05/07/2022 1010   BUN 27.8 (H) 12/30/2016 0850   CREATININE 1.23 (H) 05/07/2023 1334   CREATININE  1.0 12/30/2016 0850      Component Value Date/Time   CALCIUM 9.1 05/07/2023 1334   CALCIUM 9.7 12/30/2016 0850   ALKPHOS 100 05/07/2023 1334   ALKPHOS 46 12/30/2016 0850   AST 14 (L) 05/07/2023 1334   AST 17 12/30/2016 0850   ALT 7 05/07/2023 1334   ALT 10 12/30/2016 0850   BILITOT 0.4 05/07/2023 1334   BILITOT 0.32 12/30/2016 0850       RADIOGRAPHIC STUDIES: CUP PACEART REMOTE DEVICE CHECK  Result Date: 04/16/2023 Scheduled remote reviewed. Normal device function.  Persistent AF, controlled rates, OAC contraindicated per PA report VP 44%, upward trend with AF Next remote 91 days. LA, CVRS  CT Chest Wo Contrast  Result Date: 04/16/2023 CLINICAL DATA:  Lung nodule. History of lung cancer. * Tracking Code: BO * EXAM: CT CHEST WITHOUT CONTRAST TECHNIQUE: Multidetector CT imaging of the chest was performed following the standard protocol without IV contrast. RADIATION DOSE REDUCTION: This exam was performed according to the departmental dose-optimization program which includes automated exposure control, adjustment of the mA and/or kV according to patient size and/or use of iterative reconstruction technique. COMPARISON:  02/01/2020. FINDINGS: Cardiovascular: Atherosclerotic calcification of the aorta, aortic valve and coronary arteries. Enlarged pulmonic trunk and heart. No pericardial effusion. Mediastinum/Nodes: No pathologically enlarged mediastinal or axillary lymph nodes. Hilar regions are difficult to definitively evaluate without IV contrast. Esophagus is grossly unremarkable. Lungs/Pleura: Image quality is degraded by expiratory phase imaging and respiratory motion. Postoperative changes in the right hemithorax with pleural thickening and nodularity in the adjacent right hemithorax, largely new from 02/01/2020. Additional scattered nodular densities measure up to approximately 9 mm  in the left lower lobe (5/75), similar to 02/01/2020, given respiratory motion. No pleural fluid. Airway is  otherwise grossly unremarkable. Upper Abdomen: Visualized portions of the liver, adrenal glands, kidneys, spleen, pancreas, stomach and bowel are grossly unremarkable. No upper abdominal adenopathy. Musculoskeletal: Degenerative changes in the spine. IMPRESSION: 1. Pleural thickening and nodularity in the right hemithorax, largely new from 02/01/2020 and highly worrisome for recurrent bronchogenic carcinoma. 2. Additional scattered nodular densities in the lungs are grossly similar to 02/01/2020, given respiratory motion. Recommend continued attention on follow-up. 3. Aortic atherosclerosis (ICD10-I70.0). Coronary artery calcification. 4. Enlarged pulmonic trunk, indicative of pulmonary arterial hypertension. Electronically Signed   By: Leanna Battles M.D.   On: 04/16/2023 12:52    ASSESSMENT AND PLAN: This is a very pleasant 89 years old white female with highly suspicious malignancy involving the right hemithorax pending further staging workup and tissue diagnosis. I had a lengthy discussion with the patient and her daughter today about her condition and further investigation to confirm her diagnosis.    Right-sided chest pain Pain in the right shoulder, ribs, and back, with radiation to the right side of the chest and up the spine. No associated shortness of breath or coughing up blood. CT scan showed pleural thickening and nodularity on the right side, concerning for cancer. PET scan showed activity in the pleural space, also concerning for cancer. -Order biopsy to confirm diagnosis. -Order MRI of the brain to rule out metastasis. -Plan to discuss treatment options once more information is available.  Anal Cancer History of squamous cell carcinoma of the anus treated with surgery and radiation in December 2017. No chemotherapy was given. Currently under observation. -Continue observation.  Lung Cancer History of lung cancer treated with right middle lobe resection in 2013. No chemotherapy was  given. Currently under observation. -Continue observation.  Dizziness and Tinnitus Chronic issue with dizziness and ringing in the ears, causing imbalance. No recent changes. -Continue current management.  Basal Cell Carcinoma History of basal cell carcinoma on the nose. -Continue current management.  Follow-up in approximately three weeks to discuss results of biopsy and MRI, and potential treatment options.   The patient was advised to call immediately if she has any concerning symptoms in the interval. The patient voices understanding of current disease status and treatment options and is in agreement with the current care plan.  All questions were answered. The patient knows to call the clinic with any problems, questions or concerns. We can certainly see the patient much sooner if necessary.  Thank you so much for allowing me to participate in the care of Tricia Hernandez. I will continue to follow up the patient with you and assist in her care.  The total time spent in the appointment was 90 minutes.  Disclaimer: This note was dictated with voice recognition software. Similar sounding words can inadvertently be transcribed and may not be corrected upon review.   Lajuana Matte May 07, 2023, 2:33 PM

## 2023-05-08 ENCOUNTER — Telehealth: Payer: Self-pay | Admitting: Medical Oncology

## 2023-05-08 ENCOUNTER — Other Ambulatory Visit: Payer: Self-pay

## 2023-05-08 ENCOUNTER — Encounter: Payer: Self-pay | Admitting: General Practice

## 2023-05-08 ENCOUNTER — Other Ambulatory Visit: Payer: Self-pay | Admitting: Internal Medicine

## 2023-05-08 DIAGNOSIS — C3490 Malignant neoplasm of unspecified part of unspecified bronchus or lung: Secondary | ICD-10-CM

## 2023-05-08 DIAGNOSIS — G44209 Tension-type headache, unspecified, not intractable: Secondary | ICD-10-CM

## 2023-05-08 NOTE — Progress Notes (Signed)
Entered in error

## 2023-05-08 NOTE — Progress Notes (Signed)
I met with the Tricia Hernandez and her daughter, [name], face to face today. Tricia Hernandez is very HOH. I made a self introduction and explained the role of a NN. Tricia Hernandez will require a biopsy and a head CT to complete staging work up. Tricia Hernandez is unable to get an MRI d/t having a pacemaker. Business card provided to daughter [name] and the Tricia Hernandez. All questions answered.

## 2023-05-08 NOTE — Progress Notes (Unsigned)
Oley Balm, MD  Caroleen Hamman, NT PROCEDURE / BIOPSY REVIEW Date: 05/07/23  Requested Biopsy site: R pleural mass Reason for request: r/o met Imaging review: Best seen on PET 04/30/23  Decision: Approved Imaging modality to perform: CT Schedule with: Moderate Sedation Schedule for: Any VIR  Additional comments:   Please contact me with questions, concerns, or if issue pertaining to this request arise.  Dayne Oley Balm, MD [facility]       Previous Messages    ----- Message ----- From: Caroleen Hamman, NT Sent: 05/07/2023   3:22 PM EDT To: Ir Procedure Requests Subject: CT Biopsy                                      Procedure: CT Biopsy  Reason: CT-guided core biopsy of hypermetabolic pleural-based mass Dx: Malignant neoplasm of unspecified part of unspecified bronchus or lung  History: PET CT and DG in chart  Provider: Si Gaul, MD  Contact: [phone]

## 2023-05-08 NOTE — Telephone Encounter (Signed)
MRI /Pacemaker-Not safe. Scheduler talked to radiologist . Pt has a has a pacemaker and it is not safe for her to have an MRI.  Please cancel the MRI and consider another diagnostic tool.

## 2023-05-09 NOTE — Progress Notes (Signed)
I called central scheduling to get the appt for her head CT scheduled. Spoke to [place]. Told her pt was hoping to get the head CT done at [facility] as it's closest to [place], [place], however Shanda Bumps was able to schedule it at [facility] the same day as pt's biopsy. I called the pt's daughter [name] to let her know.

## 2023-05-12 ENCOUNTER — Other Ambulatory Visit: Payer: Self-pay | Admitting: Internal Medicine

## 2023-05-12 MED ORDER — OXYCODONE-ACETAMINOPHEN 5-325 MG PO TABS
1.0000 | ORAL_TABLET | Freq: Three times a day (TID) | ORAL | 0 refills | Status: DC | PRN
Start: 2023-05-12 — End: 2023-09-01

## 2023-05-13 NOTE — Progress Notes (Signed)
I reached out to the pt's daughter to let her know that I had passed along the pt's concerns about the pain under her R arm/ribs to Dr Arbutus Ped and he has placed a Rx for percocet to the pt's pharmacy in [place], [place]. Tricia Hernandez verbalized appreciation. No additional concerns at this time.

## 2023-05-23 ENCOUNTER — Other Ambulatory Visit: Payer: Self-pay | Admitting: Student

## 2023-05-23 DIAGNOSIS — C3400 Malignant neoplasm of unspecified main bronchus: Secondary | ICD-10-CM

## 2023-05-24 ENCOUNTER — Other Ambulatory Visit: Payer: Self-pay | Admitting: Student

## 2023-05-26 ENCOUNTER — Other Ambulatory Visit: Payer: Medicare Other

## 2023-05-26 ENCOUNTER — Ambulatory Visit (HOSPITAL_COMMUNITY): Admission: RE | Admit: 2023-05-26 | Payer: Medicare Other | Source: Ambulatory Visit

## 2023-05-26 ENCOUNTER — Telehealth: Payer: Self-pay | Admitting: Family

## 2023-05-26 ENCOUNTER — Other Ambulatory Visit: Payer: Self-pay

## 2023-05-26 ENCOUNTER — Telehealth: Payer: Self-pay | Admitting: Hematology and Oncology

## 2023-05-26 ENCOUNTER — Ambulatory Visit (HOSPITAL_COMMUNITY)
Admission: RE | Admit: 2023-05-26 | Discharge: 2023-05-26 | Disposition: A | Discharge status: Home / Self Care | Payer: Medicare Other | Source: Ambulatory Visit | Attending: Internal Medicine | Admitting: Internal Medicine

## 2023-05-26 ENCOUNTER — Ambulatory Visit: Payer: Medicare Other | Admitting: Physician Assistant

## 2023-05-26 DIAGNOSIS — I2510 Atherosclerotic heart disease of native coronary artery without angina pectoris: Secondary | ICD-10-CM | POA: Diagnosis present

## 2023-05-26 DIAGNOSIS — J984 Other disorders of lung: Secondary | ICD-10-CM | POA: Diagnosis not present

## 2023-05-26 DIAGNOSIS — E785 Hyperlipidemia, unspecified: Secondary | ICD-10-CM | POA: Diagnosis not present

## 2023-05-26 DIAGNOSIS — C3491 Malignant neoplasm of unspecified part of right bronchus or lung: Secondary | ICD-10-CM | POA: Insufficient documentation

## 2023-05-26 DIAGNOSIS — I700 Atherosclerosis of aorta: Secondary | ICD-10-CM | POA: Diagnosis not present

## 2023-05-26 DIAGNOSIS — Z85048 Personal history of other malignant neoplasm of rectum, rectosigmoid junction, and anus: Secondary | ICD-10-CM | POA: Insufficient documentation

## 2023-05-26 DIAGNOSIS — Z950 Presence of cardiac pacemaker: Secondary | ICD-10-CM | POA: Diagnosis not present

## 2023-05-26 DIAGNOSIS — F419 Anxiety disorder, unspecified: Secondary | ICD-10-CM | POA: Insufficient documentation

## 2023-05-26 DIAGNOSIS — I4891 Unspecified atrial fibrillation: Secondary | ICD-10-CM | POA: Insufficient documentation

## 2023-05-26 DIAGNOSIS — Z87891 Personal history of nicotine dependence: Secondary | ICD-10-CM | POA: Diagnosis not present

## 2023-05-26 DIAGNOSIS — C3490 Malignant neoplasm of unspecified part of unspecified bronchus or lung: Secondary | ICD-10-CM

## 2023-05-26 DIAGNOSIS — C3400 Malignant neoplasm of unspecified main bronchus: Secondary | ICD-10-CM

## 2023-05-26 DIAGNOSIS — K219 Gastro-esophageal reflux disease without esophagitis: Secondary | ICD-10-CM | POA: Diagnosis not present

## 2023-05-26 DIAGNOSIS — G44209 Tension-type headache, unspecified, not intractable: Secondary | ICD-10-CM

## 2023-05-26 LAB — CBC
HCT: 37.6 % (ref 36.0–46.0)
Hemoglobin: 11.4 g/dL — ABNORMAL LOW (ref 12.0–15.0)
MCH: 26.3 pg (ref 26.0–34.0)
MCHC: 30.3 g/dL (ref 30.0–36.0)
MCV: 86.6 fL (ref 80.0–100.0)
Platelets: 144 10*3/uL — ABNORMAL LOW (ref 150–400)
RBC: 4.34 MIL/uL (ref 3.87–5.11)
RDW: 16.9 % — ABNORMAL HIGH (ref 11.5–15.5)
WBC: 9.1 10*3/uL (ref 4.0–10.5)
nRBC: 0 % (ref 0.0–0.2)

## 2023-05-26 LAB — PROTIME-INR
INR: 1 (ref 0.8–1.2)
Prothrombin Time: 13.2 s (ref 11.4–15.2)

## 2023-05-26 MED ORDER — MIDAZOLAM HCL 2 MG/2ML IJ SOLN
INTRAMUSCULAR | Status: AC
  Filled 2023-05-26: qty 2

## 2023-05-26 MED ORDER — MIDAZOLAM HCL 2 MG/2ML IJ SOLN
INTRAMUSCULAR | Status: AC | PRN
Start: 2023-05-26 — End: 2023-05-26
  Administered 2023-05-26 (×3): 1 mg via INTRAVENOUS

## 2023-05-26 MED ORDER — FENTANYL CITRATE (PF) 100 MCG/2ML IJ SOLN
INTRAMUSCULAR | Status: AC | PRN
Start: 2023-05-26 — End: 2023-05-26
  Administered 2023-05-26 (×3): 50 ug via INTRAVENOUS

## 2023-05-26 MED ORDER — FENTANYL CITRATE (PF) 100 MCG/2ML IJ SOLN
INTRAMUSCULAR | Status: AC
  Filled 2023-05-26: qty 2

## 2023-05-26 MED ORDER — LIDOCAINE HCL 1 % IJ SOLN
10.0000 mL | Freq: Once | INTRAMUSCULAR | Status: AC
  Administered 2023-05-26: 10 mL via INTRADERMAL

## 2023-05-26 MED ORDER — IOHEXOL 350 MG/ML SOLN
75.0000 mL | Freq: Once | INTRAVENOUS | Status: AC | PRN
  Administered 2023-05-26: 75 mL via INTRAVENOUS

## 2023-05-26 NOTE — Procedures (Signed)
Interventional Radiology Procedure Note  Procedure: CT Guided Biopsy of right pleural mass  Complications: None  Estimated Blood Loss: < 10 mL  Findings: 18 G core biopsy of right pleural mass performed under CT guidance.  Single core sample obtained and sent to Pathology.  Jodi Marble. Fredia Sorrow, M.D Pager:  [phone]

## 2023-05-26 NOTE — Sedation Documentation (Signed)
Pt has audible wheezes, Dr. Fredia Sorrow aware.

## 2023-05-26 NOTE — Telephone Encounter (Signed)
Patients daughter called to request an oversized lift chair to be ordered from [facility] in [place]. Daughter did not have phone number. Please advise pt

## 2023-05-26 NOTE — Telephone Encounter (Signed)
TC from Pt's daughter asking for a prescription for and oversized wheelchair to be sent to [facility] in [place]. TC back to Pt's daughter and informed her to call the Pt's primary provider for this prescription. Pt's daughter verbalized understanding.

## 2023-05-26 NOTE — H&P (Signed)
Chief Complaint: Patient was seen in consultation today for concern for recurrent lung cancer  Referring Physician(s): Mohamed,Mohamed  Supervising Physician: Irish Lack  Patient Status: [facility] - Out-pt  History of Present Illness: Tricia Hernandez is a 89 y.o. female with a past medical history significant for anxiety, GERD, HTN, HLD, CHF, a.fib s/p pacemaker and anal cancer who presents today for right pleural mass biopsy. Tricia Hernandez was diagnosed with anal cancer in 2017 and underwent radiation and surgery. Tricia Hernandez began to experience right sided chest pain in March of this year and eventually underwent a CT chest w/o contrast 04/16/23 which showed:  1. Pleural thickening and nodularity in the right hemithorax, largely new from 02/01/2020 and highly worrisome for recurrent bronchogenic carcinoma. 2. Additional scattered nodular densities in the lungs are grossly similar to 02/01/2020, given respiratory motion. Recommend continued attention on follow-up. 3. Aortic atherosclerosis (ICD10-I70.0). Coronary artery calcification. 4. Enlarged pulmonic trunk, indicative of pulmonary arterial Hypertension  She was referred to oncology and underwent PET scan 05/12/23 which showed:  1. The extensive pleural nodularity in the right hemithorax demonstrates marked hypermetabolic activity consistent with pleural metastatic disease. No definite nodal or pulmonary parenchymal involvement. 2. No evidence of extrathoracic metastatic disease. No suspicious activity in the left hemithorax. 3. Stable appearance of distal colonic diverticulosis status post sigmoid colostomy. 4.  Aortic Atherosclerosis (ICD10-I70.0).  She has been referred to IR for a right pleural mass biopsy to further direct care.  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 5/2 EGD: grade A reflux esophagitis.  Mild, non-hemorrhagic gastritis.  Ablation of 3 Duodenal AVMs   Herpes zoster    History of blood transfusion    for vaginal, uterine bleeding leading to hysterectomy.  in 12/2016 transfused x 1 for GI bleed.    History of hiatal hernia    Hyperlipidemia    Hypertension    Labyrinthitis    Lung cancer (HCC)    RML   Obesity    On home oxygen therapy    "3L just at night when I sleep" (04/20/2018)   Paroxysmal supraventricular tachycardia    Peptic ulcer of stomach    hx   Pneumonia 1980s X 1; 08/2016   "walking; double"   Presence of permanent cardiac pacemaker 04/20/2018   Pulmonary embolism (HCC) 05/2014   "left lung"   Seasonal allergies    "bad" (04/20/2018)   Vitamin D deficiency     Past Surgical History:  Procedure Laterality Date   APPENDECTOMY  1959   BASAL CELL CARCINOMA EXCISION     nose x 2   BREAST CYST ASPIRATION Right 2016?   CATARACT EXTRACTION W/ INTRAOCULAR LENS  IMPLANT, BILATERAL Bilateral 1990s   CHOLECYSTECTOMY OPEN  1980s   COLON SURGERY     COLOSTOMY N/A 07/29/2016   Procedure: COLOSTOMY;  Surgeon: Berna Bue, MD;  Location: [facility] OR;  Service:  General;  Laterality: N/A;   DILATION AND CURETTAGE OF UTERUS     ESOPHAGOGASTRODUODENOSCOPY N/A 12/11/2016   Procedure: ESOPHAGOGASTRODUODENOSCOPY (EGD);  Surgeon: [name] Fiedler, MD;  Location: [facility] ENDOSCOPY;  Service: Endoscopy;  Laterality: N/A;   EVALUATION UNDER ANESTHESIA WITH HEMORRHOIDECTOMY AND PROCTOSCOPY N/A 07/25/2016   Procedure: EXAM UNDER ANESTHESIA, EXCISION PERIANAL MASS.;  Surgeon: Jimmye Norman, MD;  Location: [facility] OR;  Service:  General;  Laterality: N/A;  Prone position   FLEXIBLE SIGMOIDOSCOPY N/A 06/16/2017   Procedure: FLEXIBLE SIGMOIDOSCOPY EXAM UNDER ANESHESIA;  Surgeon: Andria Meuse, MD;  Location: [facility] ENDOSCOPY;  Service: General;  Laterality: N/A;   INSERT / REPLACE / REMOVE PACEMAKER  04/20/2018   IR RADIOLOGIST EVAL & MGMT  03/25/2017   LAPAROSCOPIC DIVERTED COLOSTOMY N/A 07/29/2016   Procedure: ATTEMPTED LAPAROSCOPIC ASSISTED COLOSTOMY;  Surgeon: Berna Bue, MD;  Location: [facility] OR;  Service: General;  Laterality: N/A;   LUNG REMOVAL, PARTIAL Right 2013   RML mass/carcinoid, Dr Latanya Maudlin   PACEMAKER IMPLANT N/A 04/20/2018   Procedure: PACEMAKER IMPLANT;  Surgeon: Marinus Maw, MD;  Location: [facility] INVASIVE CV LAB;  Service: Cardiovascular;  Laterality: N/A;   SQUAMOUS CELL CARCINOMA EXCISION     rectum, right leg   TONSILLECTOMY AND ADENOIDECTOMY  1960s   VAGINAL HYSTERECTOMY  1959   "partial"    Allergies: Penicillins; Aleve [naproxen]; Aleve [naproxen sodium]; Antihistamines, chlorpheniramine-type; Aspirin; Benadryl [diphenhydramine hcl]; Codeine; Hydrochlorothiazide; Rocephin [ceftriaxone sodium in dextrose]; and Sulfa antibiotics  Medications: Prior to Admission medications   Medication Sig Start Date End Date Taking? Authorizing Provider  ALLERGY RELIEF 180 MG tablet TAKE 1 TABLET(180 MG) BY MOUTH DAILY 10/10/22  Yes [name], FNP  arformoterol [facility]) 15 MCG/2ML NEBU INHALE CONTENTS OF 1 VIAL VIA NEBULIZER TWICE DAILY 02/26/23  Yes Mannam, Praveen, MD  atorvastatin (LIPITOR) 20 MG tablet TAKE 1 TABLET(20 MG) BY MOUTH AT BEDTIME 12/20/22  Yes [name], FNP  budesonide (PULMICORT) 0.5 MG/2ML nebulizer solution USE 1 VIAL VIA NEBULIZER TWICE DAILY. 02/26/23  Yes Mannam, Praveen, MD  Calcium 600-200 MG-UNIT tablet Take 1 tablet by mouth at bedtime.    Yes [provider]  digoxin (LANOXIN) 0.125 MG tablet Take 1 Tablet Daily Monday -Saturday None on Sunday  03/27/23  Yes Marinus Maw, MD  ferrous sulfate 325 (65 FE) MG EC tablet Take 325 mg by mouth 2 (two) times daily.   Yes [provider]  fluticasone (FLONASE) 50 MCG/ACT nasal spray Place 1 spray into both nostrils daily.   Yes [provider]  furosemide (LASIX) 20 MG tablet Take 1 tablet (20 mg total) by mouth daily. 12/19/22  Yes Marinus Maw, MD  ipratropium-albuterol (DUONEB) 0.5-2.5 (3) MG/3ML SOLN USE 1 VIAL VIA NEBULIZER TWICE DAILY 02/26/23  Yes Mannam, Praveen, MD  levocetirizine (XYZAL) 5 MG tablet Take 1 tablet by mouth daily. 11/14/21  Yes [provider]  MAGnesium-Oxide 400 (240 Mg) MG tablet Take 1 tablet (400 mg total) by mouth daily. Please contact the office to schedule appointment for additional refills. 06/26/22  Yes Marinus Maw, MD  nitrofurantoin, macrocrystal-monohydrate, (MACROBID) 100 MG capsule Take 1 capsule (100 mg total) by mouth 2 (two) times daily. 07/02/22  Yes [name], FNP  oxyCODONE-acetaminophen (PERCOCET/ROXICET) 5-325 MG tablet Take 1 tablet by mouth every 8 (eight) hours as needed for severe pain. 05/12/23  Yes Si Gaul, MD  PARoxetine (PAXIL) 20 MG tablet Take 1 tablet (20 mg total) by mouth at bedtime.  03/14/23  Yes [name], FNP  vitamin B-12 (CYANOCOBALAMIN) 1000 MCG tablet Take 1,000 mcg by mouth at bedtime.    Yes [provider]  Vitamin D, Ergocalciferol, (DRISDOL) 1.25 MG (50000 UNIT) CAPS capsule TAKE 1 CAPSULE BY MOUTH 1 TIME A WEEK 03/21/23  Yes Serena Croissant, MD  YUPELRI 175 MCG/3ML nebulizer solution USE 1 VIAL VIA NEBULIZER DAILY 04/29/23  Yes Mannam, Praveen, MD  anti-nausea (EMETROL) solution Take 10 mLs by mouth every 15 (fifteen) minutes as needed for nausea or vomiting.    [provider]  diltiazem (CARDIZEM CD) 120 MG 24 hr capsule Take 1 capsule (120 mg total) by mouth daily. 11/05/22 03/31/23  Sheilah Pigeon, PA-C     Family History  Problem Relation Age  of Onset   Heart attack Mother        Dec 1987   CVA Mother    Hypertension Mother    Multiple myeloma Mother    Breast cancer Sister    Skin cancer Sister    Prostate cancer Brother    Throat cancer Brother    Cervical cancer Daughter    Leukemia Daughter    Leukemia Son    Throat cancer Brother    Cancer Brother        soft palette   Heart Problems Brother        Pacemaker   Colon cancer Neg Hx    Pancreatic cancer Neg Hx     Social History   Socioeconomic History   Marital status: Widowed    Spouse name: Not on file   Number of children: 5   Years of education: Not on file   Highest education level: Not on file  Occupational History   Occupation: retired  Tobacco Use   Smoking status: Former    Types: Cigarettes   Smokeless tobacco: Never   Tobacco comments:    04/20/2018 "smoked once in awhile in the 1960s; not more that 4 packs total in my whole life"  Vaping Use   Vaping status: Never Used  Substance and Sexual Activity   Alcohol use: Never   Drug use: Never   Sexual activity: Not Currently  Other Topics Concern   Not on file  Social History Narrative   Patient is widowed and retired she has 5 children, she lives alone and performs all of her ADLs   Former smoker, never alcohol never drug use   Social Determinants of Corporate investment banker Strain: Low Risk  (02/26/2022)   Overall Financial Resource Strain (CARDIA)    Difficulty of Paying Living Expenses: Not hard at all  Food Insecurity: No Food Insecurity (02/26/2022)   Hunger Vital Sign    Worried About Running Out of Food in the Last Year: Never true    Ran Out of Food in the Last Year: Never true  Transportation Needs: No Transportation Needs (02/26/2022)   PRAPARE - Administrator, Civil Service (Medical): No    Lack of Transportation (Non-Medical): No  Physical Activity: Inactive (02/26/2022)   Exercise Vital Sign    Days of Exercise per Week: 0 days    Minutes of Exercise per  Session: 0 min  Stress: No Stress Concern Present (02/26/2022)   [employer] of Occupational Health - Occupational Stress Questionnaire    Feeling of Stress : Not at all  Social Connections: Moderately Integrated (02/26/2022)   Social Connection and Isolation Panel [NHANES]    Frequency of Communication with Friends  and Family: More than three times a week    Frequency of Social Gatherings with Friends and Family: More than three times a week    Attends Religious Services: More than 4 times per year    Active Member of Clubs or Organizations: Yes    Attends Banker Meetings: More than 4 times per year    Marital Status: Widowed     Review of Systems: A 12 point ROS discussed and pertinent positives are indicated in the HPI above.  All other systems are negative.  Review of Systems  Constitutional:  Negative for chills and fever.  Respiratory:  Negative for cough and shortness of breath.   Cardiovascular:  Negative for chest pain.  Gastrointestinal:  Negative for diarrhea, nausea and vomiting.  Musculoskeletal:  Negative for back pain.  Neurological:  Negative for dizziness and headaches.    Vital Signs: BP (!) 141/70   Pulse 79   Temp (!) 97.2 F (36.2 C) (Temporal)   Resp 18   Ht 5\' 4"  (1.626 m)   Wt 237 lb (107.5 kg)   SpO2 93%   BMI 40.68 kg/m   Physical Exam Vitals reviewed.  Constitutional:      General: She is not in acute distress. HENT:     Head: Normocephalic.     Mouth/Throat:     Mouth: Mucous membranes are moist.     Pharynx: Oropharynx is clear. No oropharyngeal exudate or posterior oropharyngeal erythema.  Cardiovascular:     Rate and Rhythm: Normal rate and regular rhythm.     Comments: (+) pacemaker sounds Pulmonary:     Effort: Pulmonary effort is normal.     Breath sounds: Normal breath sounds.  Abdominal:     General: There is no distension.     Palpations: Abdomen is soft.     Tenderness: There is no abdominal tenderness.   Skin:    General: Skin is warm and dry.  Neurological:     Mental Status: She is alert and oriented to person, place, and time.  Psychiatric:        Mood and Affect: Mood normal.        Behavior: Behavior normal.        Thought Content: Thought content normal.        Judgment: Judgment normal.      MD Evaluation Airway: WNL Heart: WNL Abdomen: WNL Chest/ Lungs: WNL ASA  Classification: 3 Mallampati/Airway Score: One (Upper and lower dentures)   Imaging: NM PET Image Initial (PI) Skull Base To Thigh  Result Date: 05/12/2023 CLINICAL DATA:  Subsequent treatment strategy for carcinoid tumor status post lung resection in 2013. History of squamous cell carcinoma of the anus. Increasing right hemithorax pleural thickening and nodularity on recent CT worrisome for recurrent neoplasm. EXAM: NUCLEAR MEDICINE PET SKULL BASE TO THIGH TECHNIQUE: 11.61 mCi F-18 FDG was injected intravenously. Full-ring PET imaging was performed from the skull base to thigh after the radiotracer. CT data was obtained and used for attenuation correction and anatomic localization. Fasting blood glucose: 95 mg/dl COMPARISON:  Chest CT 78/29/5621. CT of the chest, abdomen and pelvis CT 02/01/2020. PET-CT 02/10/2018. FINDINGS: Mediastinal blood pool activity: SUV max 2.6 NECK: No hypermetabolic cervical lymph nodes are identified. No suspicious activity identified within the pharyngeal mucosal space. Incidental CT findings: Bilateral carotid atherosclerosis. CHEST: The extensive pleural nodularity in the right hemithorax demonstrated on the recent CT demonstrates marked hypermetabolic activity. A peripheral component in the mid hemithorax demonstrates an SUV  max of 12.5. Activity extends centrally along the fissures towards the right hilum, but no definite hypermetabolic hilar, mediastinal or axillary nodal activity is seen. No hypermetabolic activity or suspicious nodularity in the left hemithorax. Incidental CT findings:  Atherosclerosis of the aorta, great vessels and coronary arteries. The heart is enlarged. Pacemaker leads extend into the right atrium and right ventricle, and there are aortic valvular calcifications. No significant pleural effusion. ABDOMEN/PELVIS: There is no hypermetabolic activity within the liver, adrenal glands, spleen or pancreas. There is no hypermetabolic nodal activity in the abdomen or pelvis. Evaluation of the perineum limited by extensive urine contamination. Incidental CT findings: Left anterior abdominal wall colostomy with diffuse diverticular changes throughout the colon. Right renal cortical scarring. Aortic and branch vessel atherosclerosis without evidence of aneurysm. There is soft tissue stranding in the perirectal fat which appears chronic and is likely treatment related. SKELETON: There is no hypermetabolic activity to suggest osseous metastatic disease. Incidental CT findings: Lumbar facet arthropathy. IMPRESSION: 1. The extensive pleural nodularity in the right hemithorax demonstrates marked hypermetabolic activity consistent with pleural metastatic disease. No definite nodal or pulmonary parenchymal involvement. 2. No evidence of extrathoracic metastatic disease. No suspicious activity in the left hemithorax. 3. Stable appearance of distal colonic diverticulosis status post sigmoid colostomy. 4.  Aortic Atherosclerosis (ICD10-I70.0). Electronically Signed   By: Carey Bullocks M.D.   On: 05/12/2023 17:04    Labs:  CBC: Recent Labs    11/12/22 1122 02/24/23 0956 05/07/23 1334 05/26/23 0737  WBC 6.7 7.9 7.9 9.1  HGB 11.7* 12.8 11.1* 11.4*  HCT 37.2 41.4 35.8* 37.6  PLT 165.0 140* 158 144*    COAGS: Recent Labs    05/26/23 0737  INR 1.0    BMP: Recent Labs    11/12/22 1122 02/03/23 1003 02/24/23 0956 05/07/23 1334  NA 140 138 144 141  K 4.9 4.3 4.2 4.5  CL 101 95* 98 99  CO2 33* 35* 38* 35*  GLUCOSE 90 98 96 120*  BUN 19 16 20 18   CALCIUM 9.1 8.9 9.5 9.1   CREATININE 0.91 0.97 1.17* 1.23*  GFRNONAA  --  56* 45* 42*    LIVER FUNCTION TESTS: Recent Labs    11/12/22 1122 02/24/23 0956 05/07/23 1334  BILITOT 0.5 0.6 0.4  AST 17 16 14*  ALT 9 7 7   ALKPHOS 97 115 100  PROT 6.3 6.9 6.5  ALBUMIN 4.2 4.1 3.9    TUMOR MARKERS: No results for input(s): "AFPTM", "CEA", "CA199", "CHROMGRNA" in the last 8760 hours.  Assessment and Plan:  89 y/o F with history of anal cancer who presents today for right pleural mass biopsy to further direct care.  Risks and benefits discussed with the patient including, but not limited to bleeding, hemoptysis, respiratory failure requiring intubation, infection, pneumothorax requiring chest tube placement, stroke from air embolism or even death.  All of the patient's questions were answered, patient is agreeable to proceed.  Consent signed and in chart.  Thank you for this interesting consult.  I greatly enjoyed meeting Tricia Hernandez and look forward to participating in their care.  A copy of this report was sent to the requesting provider on this date.  Electronically Signed: Villa Herb, PA-C 05/26/2023, 8:22 AM   I spent a total of 40 Minutes  in face to face in clinical consultation, greater than 50% of which was counseling/coordinating care for recurrent cancer.

## 2023-05-28 LAB — SURGICAL PATHOLOGY

## 2023-05-28 NOTE — Telephone Encounter (Signed)
Spoke with pts daughter [name], sue would like for PCP to be aware that pt does have lung cancer again. [name] also provided the name of the organization, [employer] [facility] [place]. Pt has been made aware that the order may need to have order written by an MD however PCP will try to place the order.

## 2023-05-28 NOTE — Progress Notes (Signed)
[facility] OFFICE PROGRESS NOTE  [name], FNP [address] [place] [address]  DIAGNOSIS: Recurrent and metastatic Non-small cell lung cancer, adenocarcinoma.  She was initially diagnosed in 2013 status post right middle lobe resection, she was found to have recurrent disease in 2024 with extensive pleural nodularity in the right hemithorax without any nodal or pulmonary parenchymal involvement.  There is no evidence of extrathoracic metastatic disease.  Molecular studies reportedly being sent out tomorrow due to waiting for tissue block to return to pathology lab  PRIOR THERAPY: anal cancer treated with radiation and surgery in 2017   CURRENT THERAPY: Pending molecular studies  INTERVAL HISTORY: Tricia Hernandez 89 y.o. female returns to the clinic today for a follow-up visit accompanied by her daughter.  The patient establish care in the clinic on 05/07/2023 with Dr. Arbutus Ped.  The patient has a history of non-small cell lung cancer status post right middle lobe resection in 2013.  Unfortunately, the patient was recently found to have pleural thickening and nodularity in the right hemithorax.  She underwent CT-guided biopsy on 05/26/2023 and the final pathology shows adenocarcinoma, consistent with lung primary.    To complete the staging workup, the patient had a CT scan of the head.  She is not able to have an MRI due to having a pacemaker.  This was negative for metastatic disease.  She denies any major changes in her health since she was last seen.  She denies any fever, chills, night sweats, or unexplained weight loss.  She sometimes has some right arm pain that radiates to her back.  Dr. Arbutus Ped prescribed Percocet.  She takes a half a tablet.  She has a baseline dyspnea on exertion and when she is nervous.  She denies any cough unless she is eating something cold. She denies any hemoptysis.  Denies any nausea, vomiting, diarrhea, or  constipation.   She denies headaches or vision changes. She is here today for evaluation and for a more detailed discussion about her current condition and recommended treatment options.   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 5/2 EGD: grade A reflux esophagitis.  Mild, non-hemorrhagic gastritis.  Ablation of 3 Duodenal AVMs   Herpes zoster    History of blood transfusion    for vaginal, uterine bleeding leading to hysterectomy.  in 12/2016 transfused x 1 for GI bleed.    History of hiatal hernia    Hyperlipidemia    Hypertension    Labyrinthitis    Lung cancer (HCC)    RML   Obesity    On home oxygen therapy    "3L just at night when I sleep" (04/20/2018)   Paroxysmal supraventricular tachycardia    Peptic ulcer of stomach    hx   Pneumonia 1980s X 1; 08/2016   "walking; double"   Presence of permanent  cardiac pacemaker 04/20/2018   Pulmonary embolism (HCC) 05/2014   "left lung"   Seasonal allergies    "bad" (04/20/2018)   Vitamin D deficiency     ALLERGIES:  is allergic to penicillins; aleve [naproxen]; aleve [naproxen sodium]; antihistamines, chlorpheniramine-type; aspirin; benadryl [diphenhydramine hcl]; codeine; hydrochlorothiazide; rocephin [ceftriaxone sodium in dextrose]; and sulfa antibiotics.  MEDICATIONS:  Current Outpatient Medications  Medication Sig Dispense Refill   ALLERGY RELIEF 180 MG tablet TAKE 1 TABLET(180 MG) BY MOUTH DAILY 90 tablet 3   anti-nausea (EMETROL)  solution Take 10 mLs by mouth every 15 (fifteen) minutes as needed for nausea or vomiting.     arformoterol (BROVANA) 15 MCG/2ML NEBU INHALE CONTENTS OF 1 VIAL VIA NEBULIZER TWICE DAILY 120 mL 11   atorvastatin (LIPITOR) 20 MG tablet TAKE 1 TABLET(20 MG) BY MOUTH AT BEDTIME 90 tablet 3   budesonide (PULMICORT) 0.5 MG/2ML nebulizer solution USE 1 VIAL VIA NEBULIZER TWICE DAILY. 360 mL 11   Calcium 600-200 MG-UNIT tablet Take 1 tablet by mouth at bedtime.      digoxin (LANOXIN) 0.125 MG tablet Take 1 Tablet Daily Monday -Saturday None on Sunday 90 tablet 3   diltiazem (CARDIZEM CD) 120 MG 24 hr capsule Take 1 capsule (120 mg total) by mouth daily. 90 capsule 2   ferrous sulfate 325 (65 FE) MG EC tablet Take 325 mg by mouth 2 (two) times daily.     fluticasone (FLONASE) 50 MCG/ACT nasal spray Place 1 spray into both nostrils daily.     furosemide (LASIX) 20 MG tablet Take 1 tablet (20 mg total) by mouth daily. 90 tablet 3   ipratropium-albuterol (DUONEB) 0.5-2.5 (3) MG/3ML SOLN USE 1 VIAL VIA NEBULIZER TWICE DAILY 540 mL 11   levocetirizine (XYZAL) 5 MG tablet Take 1 tablet by mouth daily.     MAGnesium-Oxide 400 (240 Mg) MG tablet Take 1 tablet (400 mg total) by mouth daily. Please contact the office to schedule appointment for additional refills. 90 tablet 1   nitrofurantoin, macrocrystal-monohydrate, (MACROBID) 100 MG capsule Take 1 capsule (100 mg total) by mouth 2 (two) times daily. 14 capsule 0   oxyCODONE-acetaminophen (PERCOCET/ROXICET) 5-325 MG tablet Take 1 tablet by mouth every 8 (eight) hours as needed for severe pain. 30 tablet 0   PARoxetine (PAXIL) 20 MG tablet Take 1 tablet (20 mg total) by mouth at bedtime. 90 tablet 3   vitamin B-12 (CYANOCOBALAMIN) 1000 MCG tablet Take 1,000 mcg by mouth at bedtime.      Vitamin D, Ergocalciferol, (DRISDOL) 1.25 MG (50000 UNIT) CAPS capsule TAKE 1 CAPSULE BY MOUTH 1 TIME A WEEK 12 capsule 1   YUPELRI 175 MCG/3ML nebulizer solution USE 1 VIAL VIA  NEBULIZER DAILY 90 mL 1   No current facility-administered medications for this visit.   Facility-Administered Medications Ordered in Other Visits  Medication Dose Route Frequency Provider Last Rate Last Admin   sodium phosphate (FLEET) 7-19 GM/118ML enema 2 enema  2 enema Rectal Once Andria Meuse, MD        SURGICAL HISTORY:  Past Surgical History:  Procedure Laterality Date   APPENDECTOMY  1959   BASAL CELL CARCINOMA EXCISION     nose x 2   BREAST CYST ASPIRATION Right 2016?   CATARACT EXTRACTION W/ INTRAOCULAR LENS  IMPLANT, BILATERAL Bilateral 1990s   CHOLECYSTECTOMY OPEN  1980s   COLON SURGERY     COLOSTOMY N/A 07/29/2016   Procedure: COLOSTOMY;  Surgeon: Berna Bue, MD;  Location: [facility] OR;  Service:  General;  Laterality: N/A;   DILATION AND CURETTAGE OF UTERUS     ESOPHAGOGASTRODUODENOSCOPY N/A 12/11/2016   Procedure: ESOPHAGOGASTRODUODENOSCOPY (EGD);  Surgeon: [name] Fiedler, MD;  Location: [facility] ENDOSCOPY;  Service: Endoscopy;  Laterality: N/A;   EVALUATION UNDER ANESTHESIA WITH HEMORRHOIDECTOMY AND PROCTOSCOPY N/A 07/25/2016   Procedure: EXAM UNDER ANESTHESIA, EXCISION PERIANAL MASS.;  Surgeon: Jimmye Norman, MD;  Location: [facility] OR;  Service: General;  Laterality: N/A;  Prone position   FLEXIBLE SIGMOIDOSCOPY N/A 06/16/2017   Procedure: FLEXIBLE SIGMOIDOSCOPY EXAM UNDER ANESHESIA;  Surgeon: Andria Meuse, MD;  Location: [facility] ENDOSCOPY;  Service: General;  Laterality: N/A;   INSERT / REPLACE / REMOVE PACEMAKER  04/20/2018   IR RADIOLOGIST EVAL & MGMT  03/25/2017   LAPAROSCOPIC DIVERTED COLOSTOMY N/A 07/29/2016   Procedure: ATTEMPTED LAPAROSCOPIC ASSISTED COLOSTOMY;  Surgeon: Berna Bue, MD;  Location: [facility] OR;  Service: General;  Laterality: N/A;   LUNG REMOVAL, PARTIAL Right 2013   RML mass/carcinoid, Dr Latanya Maudlin   PACEMAKER IMPLANT N/A 04/20/2018   Procedure: PACEMAKER IMPLANT;  Surgeon: Marinus Maw, MD;  Location: [facility] INVASIVE CV LAB;  Service: Cardiovascular;   Laterality: N/A;   SQUAMOUS CELL CARCINOMA EXCISION     rectum, right leg   TONSILLECTOMY AND ADENOIDECTOMY  1960s   VAGINAL HYSTERECTOMY  1959   "partial"    REVIEW OF SYSTEMS:   Review of Systems  Constitutional: Negative for appetite change, chills, fatigue, fever and unexpected weight change.  HENT:   Negative for mouth sores, nosebleeds, sore throat and trouble swallowing.   Eyes: Negative for eye problems and icterus.  Respiratory: Positive for baseline dyspnea on exertion. Cough only if eating something cold. Negative for hemoptysis and wheezing.   Cardiovascular: Positive for right arm and posterior right chest discomfort. Negative for chest pain and leg swelling.  Gastrointestinal: Negative for abdominal pain, constipation, diarrhea, nausea and vomiting.  Genitourinary: Negative for bladder incontinence, difficulty urinating, dysuria, frequency and hematuria.   Musculoskeletal: Negative for back pain, gait problem, neck pain and neck stiffness.  Skin: Negative for itching and rash.  Neurological: Negative for dizziness, extremity weakness, gait problem, headaches, light-headedness and seizures.  Hematological: Negative for adenopathy. Does not bruise/bleed easily.  Psychiatric/Behavioral: Negative for confusion, depression and sleep disturbance. The patient is not nervous/anxious.     PHYSICAL EXAMINATION:  There were no vitals taken for this visit.  ECOG PERFORMANCE STATUS: 2-3  Physical Exam  Constitutional: Oriented to person, place, and time and well-developed, well-nourished, and in no distress.  HENT:  Head: Hard of hearing. Normocephalic and atraumatic.  Mouth/Throat: Oropharynx is clear and moist. No oropharyngeal exudate.  Eyes: Conjunctivae are normal. Right eye exhibits no discharge. Left eye exhibits no discharge. No scleral icterus.  Neck: Normal range of motion. Neck supple.  Cardiovascular: Normal rate, regular rhythm, normal heart sounds and intact distal  pulses.   Pulmonary/Chest: Effort normal. Quiet breath sounds in right lung. No respiratory distress. No wheezes. No rales.  Abdominal: Soft. Bowel sounds are normal. Exhibits no distension and no mass. There is no tenderness.  Musculoskeletal: Normal range of motion. Exhibits no edema.  Lymphadenopathy:    No cervical adenopathy.  Neurological: Alert and oriented to person, place, and time. Exhibits muscle wasting. She was examined in the wheelchair.  Skin: Skin is warm and dry. No rash noted. Not diaphoretic. No erythema. No pallor.  Psychiatric: Mood, memory and judgment normal.  Vitals reviewed.  LABORATORY DATA: Lab Results  Component Value Date   WBC  9.1 05/26/2023   HGB 11.4 (L) 05/26/2023   HCT 37.6 05/26/2023   MCV 86.6 05/26/2023   PLT 144 (L) 05/26/2023      Chemistry      Component Value Date/Time   NA 141 05/07/2023 1334   NA 142 05/07/2022 1010   NA 144 12/30/2016 0850   K 4.5 05/07/2023 1334   K 4.1 12/30/2016 0850   CL 99 05/07/2023 1334   CO2 35 (H) 05/07/2023 1334   CO2 33 (H) 12/30/2016 0850   BUN 18 05/07/2023 1334   BUN 18 05/07/2022 1010   BUN 27.8 (H) 12/30/2016 0850   CREATININE 1.23 (H) 05/07/2023 1334   CREATININE 1.0 12/30/2016 0850      Component Value Date/Time   CALCIUM 9.1 05/07/2023 1334   CALCIUM 9.7 12/30/2016 0850   ALKPHOS 100 05/07/2023 1334   ALKPHOS 46 12/30/2016 0850   AST 14 (L) 05/07/2023 1334   AST 17 12/30/2016 0850   ALT 7 05/07/2023 1334   ALT 10 12/30/2016 0850   BILITOT 0.4 05/07/2023 1334   BILITOT 0.32 12/30/2016 0850       RADIOGRAPHIC STUDIES:  CT BIOPSY  Result Date: 05/26/2023 CLINICAL DATA:  Right-sided pleural masses and history squamous carcinoma of the anus. EXAM: CT GUIDED CORE BIOPSY OF RIGHT PLEURAL MASS ANESTHESIA/SEDATION: Moderate (conscious) sedation was employed during this procedure. A total of Versed 3.0 mg and Fentanyl 150 mcg was administered intravenously by radiology nursing. Moderate  Sedation Time: 30 minutes. The patient's level of consciousness and vital signs were monitored continuously by radiology nursing throughout the procedure under my direct supervision. PROCEDURE: The procedure risks, benefits, and alternatives were explained to the patient. Questions regarding the procedure were encouraged and answered. The patient understands and consents to the procedure. A time-out was performed prior to initiating the procedure. CT was performed through the chest in a prone position. The posterior right chest wall was prepped with chlorhexidine in a sterile fashion, and a sterile drape was applied covering the operative field. A sterile gown and sterile gloves were used for the procedure. Local anesthesia was provided with 1% Lidocaine. Under CT guidance, a 17 gauge trocar needle was advanced into the posterior right pleural space. After confirming needle tip position, 2 separate coaxial 18 gauge core biopsy passes were made with a core biopsy device. Core biopsy sample was placed in formalin. A BioSentry plug was advanced through the outer needle and the outer needle removed. Additional CT was performed. RADIATION DOSE REDUCTION: This exam was performed according to the departmental dose-optimization program which includes automated exposure control, adjustment of the mA and/or kV according to patient size and/or use of iterative reconstruction technique. COMPLICATIONS: None FINDINGS: Pleural masses are again noted in the right hemithorax predominantly in the posterior and lateral pleural space as noted by recent imaging. Nodular pleural mass in the posterior pleural space was targeted from an oblique approach. There was difficulty in accurate placement of the guide trocar needle due to patient movement and irritability despite sedation. Ultimately, accurate needle placement was able to be achieved. The first core biopsy pass did not yield significant tissue. The second core biopsy pass did yield  an intact core biopsy sample. Post biopsy imaging shows no evidence of pneumothorax or hemothorax. IMPRESSION: CT-guided core biopsy performed of a posterior right pleural mass. Accurate biopsy was difficult due to patient movement during the procedure but an intact core biopsy sample was able to be obtained. Electronically Signed   By: Sherrine Maples  Fredia Sorrow M.D.   On: 05/26/2023 11:39   NM PET Image Initial (PI) Skull Base To Thigh  Result Date: 05/12/2023 CLINICAL DATA:  Subsequent treatment strategy for carcinoid tumor status post lung resection in 2013. History of squamous cell carcinoma of the anus. Increasing right hemithorax pleural thickening and nodularity on recent CT worrisome for recurrent neoplasm. EXAM: NUCLEAR MEDICINE PET SKULL BASE TO THIGH TECHNIQUE: 11.61 mCi F-18 FDG was injected intravenously. Full-ring PET imaging was performed from the skull base to thigh after the radiotracer. CT data was obtained and used for attenuation correction and anatomic localization. Fasting blood glucose: 95 mg/dl COMPARISON:  Chest CT 21/30/8657. CT of the chest, abdomen and pelvis CT 02/01/2020. PET-CT 02/10/2018. FINDINGS: Mediastinal blood pool activity: SUV max 2.6 NECK: No hypermetabolic cervical lymph nodes are identified. No suspicious activity identified within the pharyngeal mucosal space. Incidental CT findings: Bilateral carotid atherosclerosis. CHEST: The extensive pleural nodularity in the right hemithorax demonstrated on the recent CT demonstrates marked hypermetabolic activity. A peripheral component in the mid hemithorax demonstrates an SUV max of 12.5. Activity extends centrally along the fissures towards the right hilum, but no definite hypermetabolic hilar, mediastinal or axillary nodal activity is seen. No hypermetabolic activity or suspicious nodularity in the left hemithorax. Incidental CT findings: Atherosclerosis of the aorta, great vessels and coronary arteries. The heart is enlarged.  Pacemaker leads extend into the right atrium and right ventricle, and there are aortic valvular calcifications. No significant pleural effusion. ABDOMEN/PELVIS: There is no hypermetabolic activity within the liver, adrenal glands, spleen or pancreas. There is no hypermetabolic nodal activity in the abdomen or pelvis. Evaluation of the perineum limited by extensive urine contamination. Incidental CT findings: Left anterior abdominal wall colostomy with diffuse diverticular changes throughout the colon. Right renal cortical scarring. Aortic and branch vessel atherosclerosis without evidence of aneurysm. There is soft tissue stranding in the perirectal fat which appears chronic and is likely treatment related. SKELETON: There is no hypermetabolic activity to suggest osseous metastatic disease. Incidental CT findings: Lumbar facet arthropathy. IMPRESSION: 1. The extensive pleural nodularity in the right hemithorax demonstrates marked hypermetabolic activity consistent with pleural metastatic disease. No definite nodal or pulmonary parenchymal involvement. 2. No evidence of extrathoracic metastatic disease. No suspicious activity in the left hemithorax. 3. Stable appearance of distal colonic diverticulosis status post sigmoid colostomy. 4.  Aortic Atherosclerosis (ICD10-I70.0). Electronically Signed   By: Carey Bullocks M.D.   On: 05/12/2023 17:04     ASSESSMENT/PLAN:  Lung Cancer This is a very pleasant 89 year old Caucasian female with recurrent non-small cell lung cancer, adenocarcinoma.  She initially underwent right middle lobe resection in 2013.  In August 2024, she was found to have new extensive pleural nodularity in the right hemithorax.  Molecular studies were requested last week but the tissue block needed to return to the pathology lab.  This is reportedly being sent out tomorrow.  The patient was seen with Dr. Arbutus Ped today.  She had a CT scan of the head which was negative for metastatic  disease.  The patient was seen with Dr. Arbutus Ped.  Dr. Arbutus Ped had a lengthy discussion with the patient about her current condition and treatment options.  It is important for [place] to know if the patient is a candidate for targeted treatment, which is likely her best option given her age.  Therefore, we will wait for the results of her molecular studies to see if she is a candidate for targeted treatment.  We will also request PD-L1.  We will see her back in approximately 2 weeks to review the results of her molecular studies and treatment options.  If she is not a candidate for targeted treatment, then we will discuss chemotherapy options.  She will continue taking half a tablet of Percocet if needed for cancer related pain.  Basal Cell Carcinoma History of basal cell carcinoma on the nose. -Continue current management.  Anal Cancer History of squamous cell carcinoma of the anus treated with surgery and radiation in December 2017. No chemotherapy was given. Currently under observation. -Continue observation.  The patient was advised to call immediately if she has any concerning symptoms in the interval. The patient voices understanding of current disease status and treatment options and is in agreement with the current care plan. All questions were answered. The patient knows to call the clinic with any problems, questions or concerns. We can certainly see the patient much sooner if necessary   No orders of the defined types were placed in this encounter.   Kimble Hitchens L Carlicia Leavens, PA-C 05/28/23  ADDENDUM: Hematology/Oncology Attending:  I had a face-to-face encounter with the patient today.  I reviewed her records, lab, scan as well as the pathology report and recommended her care plan.  This is a very pleasant 89 years old white female recently diagnosed with recurrent, stage IV non-small cell lung cancer, adenocarcinoma in October 2024.  She was initially diagnosed as early stage  disease in 2016 status post right middle lobectomy.  The patient presented recently with extensive pleural nodularity in the right hemothorax without any nodal or pulmonary parenchymal involvement and no evidence of extrathoracic metastatic disease.  She underwent CT-guided core biopsy by interventional radiology and the final pathology was consistent with adenocarcinoma of the lung.  We recommended her tissue block to be sent for molecular studies and PD-L1 expression. I had a lengthy discussion with the patient and her daughter today about her current disease condition and treatment options. I explained to the patient that she has incurable condition and all the treatment will be of palliative nature. I will wait for the molecular studies to identify any actionable mutations to be used as treatment for this patient but if she has no evidence of actionable mutations, she may benefit from treatment with a course of chemoimmunotherapy or single agent immunotherapy if she has high PD-L1 expression. I will arrange for the patient to come back for follow-up visit in around 2-3 weeks for reevaluation and discussion of her treatment options in more detail based on the molecular studies. She had CT scan of the head with and without contrast that showed no evidence of metastatic disease to the brain. The patient was also given the option of palliative care and hospice but she is interested in treatment. For the pain management she will continue with her current pain medication with oxycodone. She was advised to call immediately if she has any other concerning symptoms in the interval. The total time spent in the appointment was 30 minutes. Disclaimer: This note was dictated with voice recognition software. Similar sounding words can inadvertently be transcribed and may be missed upon review. Lajuana Matte, MD

## 2023-05-29 NOTE — Progress Notes (Signed)
Request for tissue be sent to [facility] for moleculars and PD-L1 testing emailed to Lita Mains, [facility] pathology technician

## 2023-05-30 ENCOUNTER — Other Ambulatory Visit: Payer: Self-pay | Admitting: Family

## 2023-05-30 DIAGNOSIS — I480 Paroxysmal atrial fibrillation: Secondary | ICD-10-CM

## 2023-05-30 DIAGNOSIS — C3400 Malignant neoplasm of unspecified main bronchus: Secondary | ICD-10-CM

## 2023-06-02 ENCOUNTER — Inpatient Hospital Stay: Payer: Medicare Other | Attending: Hematology and Oncology

## 2023-06-02 ENCOUNTER — Inpatient Hospital Stay (HOSPITAL_BASED_OUTPATIENT_CLINIC_OR_DEPARTMENT_OTHER): Payer: Medicare Other | Admitting: Physician Assistant

## 2023-06-02 VITALS — BP 121/69 | HR 77 | Temp 98.3°F | Resp 16 | Ht 64.0 in | Wt 239.2 lb

## 2023-06-02 DIAGNOSIS — Z888 Allergy status to other drugs, medicaments and biological substances status: Secondary | ICD-10-CM | POA: Insufficient documentation

## 2023-06-02 DIAGNOSIS — M79601 Pain in right arm: Secondary | ICD-10-CM | POA: Diagnosis not present

## 2023-06-02 DIAGNOSIS — R0789 Other chest pain: Secondary | ICD-10-CM | POA: Insufficient documentation

## 2023-06-02 DIAGNOSIS — Z85048 Personal history of other malignant neoplasm of rectum, rectosigmoid junction, and anus: Secondary | ICD-10-CM | POA: Insufficient documentation

## 2023-06-02 DIAGNOSIS — C3400 Malignant neoplasm of unspecified main bronchus: Secondary | ICD-10-CM

## 2023-06-02 DIAGNOSIS — J942 Hemothorax: Secondary | ICD-10-CM | POA: Diagnosis not present

## 2023-06-02 DIAGNOSIS — Z86711 Personal history of pulmonary embolism: Secondary | ICD-10-CM | POA: Insufficient documentation

## 2023-06-02 DIAGNOSIS — H9190 Unspecified hearing loss, unspecified ear: Secondary | ICD-10-CM | POA: Diagnosis not present

## 2023-06-02 DIAGNOSIS — M47816 Spondylosis without myelopathy or radiculopathy, lumbar region: Secondary | ICD-10-CM | POA: Diagnosis not present

## 2023-06-02 DIAGNOSIS — C3490 Malignant neoplasm of unspecified part of unspecified bronchus or lung: Secondary | ICD-10-CM

## 2023-06-02 DIAGNOSIS — Z880 Allergy status to penicillin: Secondary | ICD-10-CM | POA: Insufficient documentation

## 2023-06-02 DIAGNOSIS — Z85828 Personal history of other malignant neoplasm of skin: Secondary | ICD-10-CM | POA: Insufficient documentation

## 2023-06-02 DIAGNOSIS — I700 Atherosclerosis of aorta: Secondary | ICD-10-CM | POA: Diagnosis not present

## 2023-06-02 DIAGNOSIS — Z79899 Other long term (current) drug therapy: Secondary | ICD-10-CM | POA: Insufficient documentation

## 2023-06-02 DIAGNOSIS — K5730 Diverticulosis of large intestine without perforation or abscess without bleeding: Secondary | ICD-10-CM | POA: Diagnosis not present

## 2023-06-02 DIAGNOSIS — Z881 Allergy status to other antibiotic agents status: Secondary | ICD-10-CM | POA: Insufficient documentation

## 2023-06-02 DIAGNOSIS — C342 Malignant neoplasm of middle lobe, bronchus or lung: Secondary | ICD-10-CM | POA: Insufficient documentation

## 2023-06-02 DIAGNOSIS — Z933 Colostomy status: Secondary | ICD-10-CM | POA: Insufficient documentation

## 2023-06-02 DIAGNOSIS — C782 Secondary malignant neoplasm of pleura: Secondary | ICD-10-CM | POA: Diagnosis not present

## 2023-06-02 DIAGNOSIS — I4891 Unspecified atrial fibrillation: Secondary | ICD-10-CM | POA: Diagnosis not present

## 2023-06-02 DIAGNOSIS — I6523 Occlusion and stenosis of bilateral carotid arteries: Secondary | ICD-10-CM | POA: Diagnosis not present

## 2023-06-02 DIAGNOSIS — Z902 Acquired absence of lung [part of]: Secondary | ICD-10-CM | POA: Insufficient documentation

## 2023-06-02 DIAGNOSIS — I110 Hypertensive heart disease with heart failure: Secondary | ICD-10-CM | POA: Insufficient documentation

## 2023-06-02 DIAGNOSIS — Z885 Allergy status to narcotic agent status: Secondary | ICD-10-CM | POA: Insufficient documentation

## 2023-06-02 DIAGNOSIS — Z886 Allergy status to analgesic agent status: Secondary | ICD-10-CM | POA: Insufficient documentation

## 2023-06-02 DIAGNOSIS — Z882 Allergy status to sulfonamides status: Secondary | ICD-10-CM | POA: Diagnosis not present

## 2023-06-02 DIAGNOSIS — R0609 Other forms of dyspnea: Secondary | ICD-10-CM | POA: Insufficient documentation

## 2023-06-02 DIAGNOSIS — E785 Hyperlipidemia, unspecified: Secondary | ICD-10-CM | POA: Diagnosis not present

## 2023-06-02 DIAGNOSIS — Z90710 Acquired absence of both cervix and uterus: Secondary | ICD-10-CM | POA: Insufficient documentation

## 2023-06-02 DIAGNOSIS — Z9049 Acquired absence of other specified parts of digestive tract: Secondary | ICD-10-CM | POA: Insufficient documentation

## 2023-06-02 DIAGNOSIS — Z923 Personal history of irradiation: Secondary | ICD-10-CM | POA: Insufficient documentation

## 2023-06-02 LAB — CMP (CANCER CENTER ONLY)
ALT: 7 U/L (ref 0–44)
AST: 13 U/L — ABNORMAL LOW (ref 15–41)
Albumin: 3.9 g/dL (ref 3.5–5.0)
Alkaline Phosphatase: 104 U/L (ref 38–126)
Anion gap: 9 (ref 5–15)
BUN: 17 mg/dL (ref 8–23)
CO2: 31 mmol/L (ref 22–32)
Calcium: 9.1 mg/dL (ref 8.9–10.3)
Chloride: 101 mmol/L (ref 98–111)
Creatinine: 1.1 mg/dL — ABNORMAL HIGH (ref 0.44–1.00)
GFR, Estimated: 48 mL/min — ABNORMAL LOW (ref >60)
Glucose, Bld: 145 mg/dL — ABNORMAL HIGH (ref 70–99)
Potassium: 3.8 mmol/L (ref 3.5–5.1)
Sodium: 141 mmol/L (ref 135–145)
Total Bilirubin: 0.4 mg/dL (ref 0.3–1.2)
Total Protein: 6.6 g/dL (ref 6.5–8.1)

## 2023-06-02 LAB — CBC WITH DIFFERENTIAL (CANCER CENTER ONLY)
Abs Immature Granulocytes: 0.04 10*3/uL (ref 0.00–0.07)
Basophils Absolute: 0 10*3/uL (ref 0.0–0.1)
Basophils Relative: 0 %
Eosinophils Absolute: 0.2 10*3/uL (ref 0.0–0.5)
Eosinophils Relative: 2 %
HCT: 35.9 % — ABNORMAL LOW (ref 36.0–46.0)
Hemoglobin: 11.2 g/dL — ABNORMAL LOW (ref 12.0–15.0)
Immature Granulocytes: 1 %
Lymphocytes Relative: 11 %
Lymphs Abs: 0.9 10*3/uL (ref 0.7–4.0)
MCH: 26.8 pg (ref 26.0–34.0)
MCHC: 31.2 g/dL (ref 30.0–36.0)
MCV: 85.9 fL (ref 80.0–100.0)
Monocytes Absolute: 0.5 10*3/uL (ref 0.1–1.0)
Monocytes Relative: 6 %
Neutro Abs: 6.6 10*3/uL (ref 1.7–7.7)
Neutrophils Relative %: 80 %
Platelet Count: 148 10*3/uL — ABNORMAL LOW (ref 150–400)
RBC: 4.18 MIL/uL (ref 3.87–5.11)
RDW: 16.7 % — ABNORMAL HIGH (ref 11.5–15.5)
WBC Count: 8.2 10*3/uL (ref 4.0–10.5)
nRBC: 0 % (ref 0.0–0.2)

## 2023-06-02 NOTE — Patient Instructions (Addendum)
The spot in the lung from the recent biopsy showed lung cancer. It is called adenocarcinoma. Because the area of the cancer is along the lining of the lung, this is considered stage IV. We did not see any cancer in the brain or outside of the lung.  -We need to get some special tests back to see what treatment you are a candidate for. This test takes 2 weeks or so to come back. The test is still pending.  -We are trying to see if you are a candidate for any treatment that is a pill. About 20% of people may have a marker for treatment that is a pill. Therefore, we need to check this.  -If you are not a candidate for the pill, then we have to talk about options for IV treatment such as chemotherapy. We also will check to see if you are a candidate for immunotherapy -We will see you back in about 2 weeks and hope we have the results at that time so we know what the treatment options are at that time.

## 2023-06-09 ENCOUNTER — Encounter (HOSPITAL_COMMUNITY): Payer: Self-pay

## 2023-06-11 ENCOUNTER — Encounter (HOSPITAL_COMMUNITY): Payer: Self-pay

## 2023-06-12 ENCOUNTER — Encounter (HOSPITAL_COMMUNITY): Payer: Self-pay

## 2023-06-17 ENCOUNTER — Other Ambulatory Visit: Payer: Medicare Other

## 2023-06-17 ENCOUNTER — Telehealth: Payer: Self-pay | Admitting: Family

## 2023-06-17 ENCOUNTER — Ambulatory Visit: Payer: Medicare Other | Admitting: Physician Assistant

## 2023-06-17 NOTE — Telephone Encounter (Signed)
Please let the daughter know that the home health group is requesting a justification letter for the Lift Chair.  1) She requires a face-to-face visit to discuss and document the request for the lift chair.  2) According to the guidelines, there also has to be documentation of severe hip or knee arthritis and proof that she has tried and failed both PT and medicine for management of the conditions. Obviously we can document the bilateral knee pain and see if they will accept that her co-morbidities are affecting her as well.    Regardless, Medicare will only cover $355 of the chair- the rest of the cost has to be covered by the patient. I don't know how much that is so she may want to check with the [facility] group with that question also.

## 2023-06-17 NOTE — Telephone Encounter (Signed)
Spoke with pts daughter Fuller Plan is aware of the visit needed as well as the documentation needed. Tricia Hernandez stated "just don't worry about it we will try to go a different way about it".

## 2023-06-18 ENCOUNTER — Other Ambulatory Visit: Payer: Self-pay | Admitting: *Deleted

## 2023-06-18 DIAGNOSIS — C3400 Malignant neoplasm of unspecified main bronchus: Secondary | ICD-10-CM

## 2023-06-19 ENCOUNTER — Inpatient Hospital Stay (HOSPITAL_BASED_OUTPATIENT_CLINIC_OR_DEPARTMENT_OTHER): Payer: Medicare Other | Admitting: Internal Medicine

## 2023-06-19 ENCOUNTER — Encounter: Payer: Self-pay | Admitting: Internal Medicine

## 2023-06-19 ENCOUNTER — Other Ambulatory Visit: Payer: Self-pay | Admitting: Pharmacist

## 2023-06-19 ENCOUNTER — Other Ambulatory Visit (HOSPITAL_COMMUNITY): Payer: Self-pay

## 2023-06-19 ENCOUNTER — Telehealth: Payer: Self-pay | Admitting: Pharmacist

## 2023-06-19 ENCOUNTER — Inpatient Hospital Stay: Payer: Medicare Other | Attending: Hematology and Oncology

## 2023-06-19 ENCOUNTER — Telehealth: Payer: Self-pay | Admitting: Pharmacy Technician

## 2023-06-19 VITALS — BP 133/69 | HR 75 | Temp 98.2°F | Resp 16 | Ht 64.0 in | Wt 234.0 lb

## 2023-06-19 DIAGNOSIS — Z79899 Other long term (current) drug therapy: Secondary | ICD-10-CM | POA: Diagnosis not present

## 2023-06-19 DIAGNOSIS — Z880 Allergy status to penicillin: Secondary | ICD-10-CM | POA: Insufficient documentation

## 2023-06-19 DIAGNOSIS — C3400 Malignant neoplasm of unspecified main bronchus: Secondary | ICD-10-CM

## 2023-06-19 DIAGNOSIS — E785 Hyperlipidemia, unspecified: Secondary | ICD-10-CM | POA: Insufficient documentation

## 2023-06-19 DIAGNOSIS — Z7951 Long term (current) use of inhaled steroids: Secondary | ICD-10-CM | POA: Insufficient documentation

## 2023-06-19 DIAGNOSIS — I480 Paroxysmal atrial fibrillation: Secondary | ICD-10-CM

## 2023-06-19 DIAGNOSIS — Z888 Allergy status to other drugs, medicaments and biological substances status: Secondary | ICD-10-CM | POA: Diagnosis not present

## 2023-06-19 DIAGNOSIS — R400 Somnolence: Secondary | ICD-10-CM | POA: Insufficient documentation

## 2023-06-19 DIAGNOSIS — C342 Malignant neoplasm of middle lobe, bronchus or lung: Secondary | ICD-10-CM

## 2023-06-19 DIAGNOSIS — M545 Low back pain, unspecified: Secondary | ICD-10-CM | POA: Insufficient documentation

## 2023-06-19 DIAGNOSIS — Z85048 Personal history of other malignant neoplasm of rectum, rectosigmoid junction, and anus: Secondary | ICD-10-CM | POA: Diagnosis not present

## 2023-06-19 DIAGNOSIS — Z885 Allergy status to narcotic agent status: Secondary | ICD-10-CM | POA: Diagnosis not present

## 2023-06-19 DIAGNOSIS — Z886 Allergy status to analgesic agent status: Secondary | ICD-10-CM | POA: Diagnosis not present

## 2023-06-19 DIAGNOSIS — Z881 Allergy status to other antibiotic agents status: Secondary | ICD-10-CM | POA: Diagnosis not present

## 2023-06-19 DIAGNOSIS — R0602 Shortness of breath: Secondary | ICD-10-CM | POA: Insufficient documentation

## 2023-06-19 DIAGNOSIS — Z882 Allergy status to sulfonamides status: Secondary | ICD-10-CM | POA: Insufficient documentation

## 2023-06-19 DIAGNOSIS — R079 Chest pain, unspecified: Secondary | ICD-10-CM | POA: Diagnosis not present

## 2023-06-19 DIAGNOSIS — Z9049 Acquired absence of other specified parts of digestive tract: Secondary | ICD-10-CM | POA: Diagnosis not present

## 2023-06-19 DIAGNOSIS — Z86711 Personal history of pulmonary embolism: Secondary | ICD-10-CM | POA: Insufficient documentation

## 2023-06-19 DIAGNOSIS — Z923 Personal history of irradiation: Secondary | ICD-10-CM | POA: Diagnosis not present

## 2023-06-19 DIAGNOSIS — Z90710 Acquired absence of both cervix and uterus: Secondary | ICD-10-CM | POA: Insufficient documentation

## 2023-06-19 DIAGNOSIS — I4891 Unspecified atrial fibrillation: Secondary | ICD-10-CM | POA: Diagnosis not present

## 2023-06-19 LAB — CBC WITH DIFFERENTIAL (CANCER CENTER ONLY)
Abs Immature Granulocytes: 0.04 10*3/uL (ref 0.00–0.07)
Basophils Absolute: 0 10*3/uL (ref 0.0–0.1)
Basophils Relative: 0 %
Eosinophils Absolute: 0.2 10*3/uL (ref 0.0–0.5)
Eosinophils Relative: 2 %
HCT: 35.8 % — ABNORMAL LOW (ref 36.0–46.0)
Hemoglobin: 11.3 g/dL — ABNORMAL LOW (ref 12.0–15.0)
Immature Granulocytes: 1 %
Lymphocytes Relative: 11 %
Lymphs Abs: 0.9 10*3/uL (ref 0.7–4.0)
MCH: 26.8 pg (ref 26.0–34.0)
MCHC: 31.6 g/dL (ref 30.0–36.0)
MCV: 84.8 fL (ref 80.0–100.0)
Monocytes Absolute: 0.6 10*3/uL (ref 0.1–1.0)
Monocytes Relative: 7 %
Neutro Abs: 6.8 10*3/uL (ref 1.7–7.7)
Neutrophils Relative %: 79 %
Platelet Count: 159 10*3/uL (ref 150–400)
RBC: 4.22 MIL/uL (ref 3.87–5.11)
RDW: 16.7 % — ABNORMAL HIGH (ref 11.5–15.5)
WBC Count: 8.5 10*3/uL (ref 4.0–10.5)
nRBC: 0 % (ref 0.0–0.2)

## 2023-06-19 LAB — CMP (CANCER CENTER ONLY)
ALT: 7 U/L (ref 0–44)
AST: 14 U/L — ABNORMAL LOW (ref 15–41)
Albumin: 4 g/dL (ref 3.5–5.0)
Alkaline Phosphatase: 98 U/L (ref 38–126)
Anion gap: 7 (ref 5–15)
BUN: 22 mg/dL (ref 8–23)
CO2: 34 mmol/L — ABNORMAL HIGH (ref 22–32)
Calcium: 9.2 mg/dL (ref 8.9–10.3)
Chloride: 100 mmol/L (ref 98–111)
Creatinine: 1.18 mg/dL — ABNORMAL HIGH (ref 0.44–1.00)
GFR, Estimated: 44 mL/min — ABNORMAL LOW (ref >60)
Glucose, Bld: 104 mg/dL — ABNORMAL HIGH (ref 70–99)
Potassium: 4.4 mmol/L (ref 3.5–5.1)
Sodium: 141 mmol/L (ref 135–145)
Total Bilirubin: 0.5 mg/dL (ref <1.2)
Total Protein: 6.7 g/dL (ref 6.5–8.1)

## 2023-06-19 NOTE — Progress Notes (Signed)
START ON PATHWAY REGIMEN - Non-Small Cell Lung     A cycle is every 28 days:     Osimertinib   **Always confirm dose/schedule in your pharmacy ordering system**  Patient Characteristics: Stage IV Metastatic, Nonsquamous, Molecular Analysis Completed, Molecular Alteration Present and Eligible for Molecular Targeted Therapy, Initial Molecular Targeted Therapy, EGFR Mutation - Common (Exon 19 Deletion or Exon 21 L858R Substitution) Therapeutic Status: Stage IV Metastatic Histology: Nonsquamous Cell Broad Molecular Profiling Status: Molecular Analysis Completed Molecular Analysis Results: Alteration Present and Eligible for Molecular Targeted Therapy Molecular Alteration Present: EGFR Mutation - Common (Exon 19 Deletion or Exon 21 L858R Substitution) Molecular Targeted Line of Therapy: Initial Molecular Targeted Therapy Intent of Therapy: Non-Curative / Palliative Intent, Discussed with Patient 

## 2023-06-19 NOTE — Telephone Encounter (Signed)
Oral Chemotherapy Pharmacist Encounter  I met with patient and patient's daughter in clinic today for overview of: Tagrisso for the treatment of metastatic, EGFR mutation-positive (L858R and T790M) non-small cell lung cancer, planned duration until disease progression or unacceptable toxicity.   Counseled patient on administration, dosing, side effects, monitoring, drug-food interactions, safe handling, storage, and disposal.  CBC w/ Diff and CMP from 06/19/23 assessed, noted patient with Scr of 1.18 mg/dL (CrCl ~40.9 mL/min) - no baseline dose adjustments required. Baseline EKG obtained in clinic on 06/19/23, QTcB . Prescription dose and frequency assessed for appropriateness.  Patient will take Tagrisso 80 tablets, 1 tablet by mouth once daily, without regard to food.  Tagrisso start date: pending baseline digoxin level obtained by Cardiology and dose reduction of digoxin - will update encounter with specific start date.  Adverse effects include but are not limited to: diarrhea, mouth sores, fatigue, dry skin, rash, nail changes, altered cardiac conduction, and decreased blood counts or electrolytes.  Diarrhea: Patient will obtain Imodium (loperamide) to have on hand if they experience diarrhea. Patient knows to alert the office of 4 or more loose stools above baseline. Dry skin/rash: recommended patient utilize non-scented lotion to keep skin moisturized when starting Tagrisso. Patient's daughter knows to alert office if patient experiences any rash while on Tagrisso.  Reviewed with patient importance of keeping a medication schedule and plan for any missed doses. No barriers to medication adherence identified.  Medication reconciliation performed and medication/allergy list updated. Current medication list in Epic reviewed, DDIs with Tagrisso identified: Category D drug-drug interaction between Tagrisso and Digoxin - Tagrisso, a PGP/ABCB1 inhibitor can increase serum concentrations of  digoxin, thus increase risk of digoxin toxicity. It's recommended to obtain baseline digoxin level prior to starting Tagrisso AND dose-reduce digoxin 15-30% prior to starting concomitant Tagrisso. Spoke with Laural Golden, PharmD (pharmacist with patient's cardiology office) and he will coordinate getting baseline digoxin levels and upfront digoxin dose reduction.  Category C drug-drug interaction between Tagrisso and Atorvastatin - Tagrisso may increase serum concentrations of atorvastatin - recommend monitoring patient's LFTs and any increase for myalgias. No changes in therapy required at this time.   All questions answered.  Patient agreement for treatment documented in MD note on 06/19/23.  Patient and patient's daughter voiced understanding and appreciation.   Medication education given to patient. Patient knows to call the office with questions or concerns. Oral Chemotherapy Clinic phone number provided to patient.   Lenord Carbo, PharmD, BCPS, BCOP Hematology/Oncology Clinical Pharmacist [facility] Oral Chemotherapy Navigation Clinics [phone] 06/19/2023 12:00 PM

## 2023-06-19 NOTE — Telephone Encounter (Signed)
Oral Oncology Patient Advocate Encounter   Received notification that prior authorization for Tagrisso is required.   PA submitted on 06/19/23 Key [id number] Status is pending     Jinger Neighbors, CPhT-Adv Oncology Pharmacy Patient Advocate [facility] Direct Number: [phone]  Fax: [phone]

## 2023-06-19 NOTE — Progress Notes (Signed)
Received message from Oncology that patient will be starting Tagrisso and it is recommended to get baseline digoxin levels as well as dose reduce digoxin by 15-30%. Order placed.

## 2023-06-19 NOTE — Progress Notes (Signed)
[facility] [phone]   [phone]  OFFICE PROGRESS NOTE  Tricia Bass, FNP [address] [place] [address]  DIAGNOSIS: Recurrent and metastatic Non-small cell lung cancer, adenocarcinoma.  She was initially diagnosed in 2013 status post right middle lobe resection, she was found to have recurrent disease in 2024 with extensive pleural nodularity in the right hemithorax without any nodal or pulmonary parenchymal involvement.  There is no evidence of extrathoracic metastatic disease.   Biomarker Findings HRD signature - HRDsig Negative Microsatellite status - MS-Stable Tumor Mutational Burden - 4 Muts/Mb Genomic Findings For a complete list of the genes assayed, please refer to the Appendix. EGFR L858R, T790M PBRM1 [id number]*49 RB1 R455* TP53 C141W 7 Disease relevant genes with no reportable alterations: ALK, BRAF, ERBB2, KRAS, MET, RET, ROS1 PD-L1 expression 55% PRIOR THERAPY: anal cancer treated with radiation and surgery in 2017    CURRENT THERAPY: Tagrisso 80 mg p.o. daily  INTERVAL HISTORY: Tricia Hernandez 89 y.o. female returns to the clinic today for follow-up visit accompanied by her daughter and son-in-law.Discussed the use of AI scribe software for clinical note transcription with the patient, who gave verbal consent to proceed.  History of Present Illness   The patient, an 89 year old with a history of lung adenocarcinoma, initially diagnosed in 2013 and treated with right middle lobectomy, presented with recurrence of the disease. In October 2024, she began experiencing pain and fluid accumulation around the right lung, which was later confirmed to be tumor spread. Genetic testing revealed two mutations in the EGFR gene, exon 21 L858R and T790M, which are responsive to targeted therapy.  The patient reported increased pain on the right side of the chest, which sometimes becomes persistent. She  manages the pain with half a Percocet, as a full dose causes drowsiness. Recently, she experienced new pain in the lower part of the back, also on the right side. The patient also reported shortness of breath, which has been particularly noticeable during physical activities such as walking.      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 5/2 EGD: grade A reflux esophagitis.  Mild, non-hemorrhagic gastritis.  Ablation of 3 Duodenal AVMs   Herpes zoster    History of blood transfusion    for vaginal, uterine bleeding leading to hysterectomy.  in 12/2016 transfused x 1 for GI bleed.    History of hiatal hernia    Hyperlipidemia    Hypertension    Labyrinthitis    Lung cancer (HCC)    RML   Obesity    On home oxygen therapy    "3L just at night when I sleep" (04/20/2018)   Paroxysmal supraventricular tachycardia    Peptic ulcer of stomach    hx   Pneumonia 1980s X 1; 08/2016   "walking; double"   Presence of permanent  cardiac pacemaker 04/20/2018   Pulmonary embolism (HCC) 05/2014   "left lung"   Seasonal allergies    "bad" (04/20/2018)   Vitamin D deficiency     ALLERGIES:  is allergic to penicillins; aleve [naproxen]; aleve [naproxen sodium]; antihistamines, chlorpheniramine-type; aspirin; benadryl [diphenhydramine hcl]; codeine; hydrochlorothiazide; rocephin [ceftriaxone sodium in dextrose]; and sulfa antibiotics.  MEDICATIONS:  Current Outpatient Medications  Medication Sig Dispense Refill    ALLERGY RELIEF 180 MG tablet TAKE 1 TABLET(180 MG) BY MOUTH DAILY 90 tablet 3   anti-nausea (EMETROL) solution Take 10 mLs by mouth every 15 (fifteen) minutes as needed for nausea or vomiting.     arformoterol (BROVANA) 15 MCG/2ML NEBU INHALE CONTENTS OF 1 VIAL VIA NEBULIZER TWICE DAILY 120 mL 11   atorvastatin (LIPITOR) 20 MG tablet TAKE 1 TABLET(20 MG) BY MOUTH AT BEDTIME 90 tablet 3   budesonide (PULMICORT) 0.5 MG/2ML nebulizer solution USE 1 VIAL VIA NEBULIZER TWICE DAILY. 360 mL 11   Calcium 600-200 MG-UNIT tablet Take 1 tablet by mouth at bedtime.      digoxin (LANOXIN) 0.125 MG tablet Take 1 Tablet Daily Monday -Saturday None on Sunday 90 tablet 3   diltiazem (CARDIZEM CD) 120 MG 24 hr capsule Take 1 capsule (120 mg total) by mouth daily. 90 capsule 2   ferrous sulfate 325 (65 FE) MG EC tablet Take 325 mg by mouth 2 (two) times daily.     fluticasone (FLONASE) 50 MCG/ACT nasal spray Place 1 spray into both nostrils daily.     furosemide (LASIX) 20 MG tablet Take 1 tablet (20 mg total) by mouth daily. 90 tablet 3   ipratropium-albuterol (DUONEB) 0.5-2.5 (3) MG/3ML SOLN USE 1 VIAL VIA NEBULIZER TWICE DAILY 540 mL 11   levocetirizine (XYZAL) 5 MG tablet Take 1 tablet by mouth daily.     MAGnesium-Oxide 400 (240 Mg) MG tablet Take 1 tablet (400 mg total) by mouth daily. Please contact the office to schedule appointment for additional refills. 90 tablet 1   nitrofurantoin, macrocrystal-monohydrate, (MACROBID) 100 MG capsule Take 1 capsule (100 mg total) by mouth 2 (two) times daily. 14 capsule 0   oxyCODONE-acetaminophen (PERCOCET/ROXICET) 5-325 MG tablet Take 1 tablet by mouth every 8 (eight) hours as needed for severe pain. 30 tablet 0   PARoxetine (PAXIL) 20 MG tablet Take 1 tablet (20 mg total) by mouth at bedtime. 90 tablet 3   vitamin B-12 (CYANOCOBALAMIN) 1000 MCG tablet Take 1,000 mcg by mouth at bedtime.      Vitamin D, Ergocalciferol, (DRISDOL) 1.25 MG (50000 UNIT) CAPS capsule TAKE 1  CAPSULE BY MOUTH 1 TIME A WEEK 12 capsule 1   YUPELRI 175 MCG/3ML nebulizer solution USE 1 VIAL VIA NEBULIZER DAILY 90 mL 1   No current facility-administered medications for this visit.   Facility-Administered Medications Ordered in Other Visits  Medication Dose Route Frequency Provider Last Rate Last Admin   sodium phosphate (FLEET) 7-19 GM/118ML enema 2 enema  2 enema Rectal Once Andria Meuse, MD        SURGICAL HISTORY:  Past Surgical History:  Procedure Laterality Date   APPENDECTOMY  1959   BASAL CELL CARCINOMA EXCISION     nose x 2   BREAST CYST ASPIRATION Right 2016?   CATARACT EXTRACTION W/ INTRAOCULAR LENS  IMPLANT, BILATERAL Bilateral 1990s   CHOLECYSTECTOMY OPEN  1980s   COLON SURGERY     COLOSTOMY N/A 07/29/2016   Procedure: COLOSTOMY;  Surgeon: Berna Bue, MD;  Location: [facility] OR;  Service:  General;  Laterality: N/A;   DILATION AND CURETTAGE OF UTERUS     ESOPHAGOGASTRODUODENOSCOPY N/A 12/11/2016   Procedure: ESOPHAGOGASTRODUODENOSCOPY (EGD);  Surgeon: [name] Fiedler, MD;  Location: [facility] ENDOSCOPY;  Service: Endoscopy;  Laterality: N/A;   EVALUATION UNDER ANESTHESIA WITH HEMORRHOIDECTOMY AND PROCTOSCOPY N/A 07/25/2016   Procedure: EXAM UNDER ANESTHESIA, EXCISION PERIANAL MASS.;  Surgeon: Jimmye Norman, MD;  Location: [facility] OR;  Service: General;  Laterality: N/A;  Prone position   FLEXIBLE SIGMOIDOSCOPY N/A 06/16/2017   Procedure: FLEXIBLE SIGMOIDOSCOPY EXAM UNDER ANESHESIA;  Surgeon: Andria Meuse, MD;  Location: [facility];  Service: General;  Laterality: N/A;   INSERT / REPLACE / REMOVE PACEMAKER  04/20/2018   IR RADIOLOGIST EVAL & MGMT  03/25/2017   LAPAROSCOPIC DIVERTED COLOSTOMY N/A 07/29/2016   Procedure: ATTEMPTED LAPAROSCOPIC ASSISTED COLOSTOMY;  Surgeon: Berna Bue, MD;  Location: [facility] OR;  Service: General;  Laterality: N/A;   LUNG REMOVAL, PARTIAL Right 2013   RML mass/carcinoid, Dr Latanya Maudlin   PACEMAKER IMPLANT N/A 04/20/2018   Procedure:  PACEMAKER IMPLANT;  Surgeon: Marinus Maw, MD;  Location: [facility] INVASIVE CV LAB;  Service: Cardiovascular;  Laterality: N/A;   SQUAMOUS CELL CARCINOMA EXCISION     rectum, right leg   TONSILLECTOMY AND ADENOIDECTOMY  1960s   VAGINAL HYSTERECTOMY  1959   "partial"    REVIEW OF SYSTEMS:  Constitutional: positive for fatigue Eyes: negative Ears, nose, mouth, throat, and face: negative Respiratory: positive for pleurisy/chest pain Cardiovascular: negative Gastrointestinal: negative Genitourinary:negative Integument/breast: negative Hematologic/lymphatic: negative Musculoskeletal:negative Neurological: negative Behavioral/Psych: negative Endocrine: negative Allergic/Immunologic: negative   PHYSICAL EXAMINATION: General appearance: alert, cooperative, fatigued, and no distress Head: Normocephalic, without obvious abnormality, atraumatic Neck: no adenopathy, no JVD, supple, symmetrical, trachea midline, and thyroid not enlarged, symmetric, no tenderness/mass/nodules Lymph nodes: Cervical, supraclavicular, and axillary nodes normal. Resp: diminished breath sounds RLL and dullness to percussion RLL Back: symmetric, no curvature. ROM normal. No CVA tenderness. Cardio: regular rate and rhythm, S1, S2 normal, no murmur, click, rub or gallop GI: soft, non-tender; bowel sounds normal; no masses,  no organomegaly Extremities: extremities normal, atraumatic, no cyanosis or edema Neurologic: Alert and oriented X 3, normal strength and tone. Normal symmetric reflexes. Normal coordination and gait  ECOG PERFORMANCE STATUS: 1 - Symptomatic but completely ambulatory  Blood pressure 133/69, pulse 75, temperature 98.2 F (36.8 C), resp. rate 16, height 5\' 4"  (1.626 m), weight 234 lb (106.1 kg), SpO2 97%.  LABORATORY DATA: Lab Results  Component Value Date   WBC 8.5 06/19/2023   HGB 11.3 (L) 06/19/2023   HCT 35.8 (L) 06/19/2023   MCV 84.8 06/19/2023   PLT 159 06/19/2023      Chemistry       Component Value Date/Time   NA 141 06/02/2023 1442   NA 142 05/07/2022 1010   NA 144 12/30/2016 0850   K 3.8 06/02/2023 1442   K 4.1 12/30/2016 0850   CL 101 06/02/2023 1442   CO2 31 06/02/2023 1442   CO2 33 (H) 12/30/2016 0850   BUN 17 06/02/2023 1442   BUN 18 05/07/2022 1010   BUN 27.8 (H) 12/30/2016 0850   CREATININE 1.10 (H) 06/02/2023 1442   CREATININE 1.0 12/30/2016 0850      Component Value Date/Time   CALCIUM 9.1 06/02/2023 1442   CALCIUM 9.7 12/30/2016 0850   ALKPHOS 104 06/02/2023 1442   ALKPHOS 46 12/30/2016 0850   AST 13 (L) 06/02/2023 1442   AST 17 12/30/2016 0850   ALT 7 06/02/2023  1442   ALT 10 12/30/2016 0850   BILITOT 0.4 06/02/2023 1442   BILITOT 0.32 12/30/2016 0850       RADIOGRAPHIC STUDIES: CT HEAD W & WO CONTRAST ( )  Result Date: 06/02/2023 CLINICAL DATA:  Non-small cell lung cancer (NSCLC), staging. Headache, new onset (Age >= 51y). EXAM: CT HEAD WITHOUT AND WITH CONTRAST TECHNIQUE: Contiguous axial images were obtained from the base of the skull through the vertex without and with intravenous contrast. RADIATION DOSE REDUCTION: This exam was performed according to the departmental dose-optimization program which includes automated exposure control, adjustment of the mA and/or kV according to patient size and/or use of iterative reconstruction technique. CONTRAST:  75mL OMNIPAQUE IOHEXOL 350 MG/ML SOLN COMPARISON:  Noncontrast head CT 12/30/2017 FINDINGS: Brain: There is no evidence of an acute infarct, intracranial hemorrhage, mass, midline shift, or extra-axial fluid collection. Patchy hypodensities in the cerebral white matter of progressed and are nonspecific but compatible with moderate chronic small vessel ischemic disease. There is mild cerebral atrophy. No abnormal enhancement identified. Vascular: The dural venous sinuses and large intracranial arteries are grossly patent. Skull: No acute fracture or suspicious osseous lesion. Sinuses/Orbits:  Visualized paranasal sinuses and mastoid air cells are clear. Visualized portions of the orbits are unremarkable. Other: None. IMPRESSION: 1. No evidence of intracranial metastatic disease. 2. Moderate chronic small vessel ischemic disease. Electronically Signed   By: Sebastian Ache M.D.   On: 06/02/2023 07:59   CT BIOPSY  Result Date: 05/26/2023 CLINICAL DATA:  Right-sided pleural masses and history squamous carcinoma of the anus. EXAM: CT GUIDED CORE BIOPSY OF RIGHT PLEURAL MASS ANESTHESIA/SEDATION: Moderate (conscious) sedation was employed during this procedure. A total of Versed 3.0 mg and Fentanyl 150 mcg was administered intravenously by radiology nursing. Moderate Sedation Time: 30 minutes. The patient's level of consciousness and vital signs were monitored continuously by radiology nursing throughout the procedure under my direct supervision. PROCEDURE: The procedure risks, benefits, and alternatives were explained to the patient. Questions regarding the procedure were encouraged and answered. The patient understands and consents to the procedure. A time-out was performed prior to initiating the procedure. CT was performed through the chest in a prone position. The posterior right chest wall was prepped with chlorhexidine in a sterile fashion, and a sterile drape was applied covering the operative field. A sterile gown and sterile gloves were used for the procedure. Local anesthesia was provided with 1% Lidocaine. Under CT guidance, a 17 gauge trocar needle was advanced into the posterior right pleural space. After confirming needle tip position, 2 separate coaxial 18 gauge core biopsy passes were made with a core biopsy device. Core biopsy sample was placed in formalin. A BioSentry plug was advanced through the outer needle and the outer needle removed. Additional CT was performed. RADIATION DOSE REDUCTION: This exam was performed according to the departmental dose-optimization program which includes  automated exposure control, adjustment of the mA and/or kV according to patient size and/or use of iterative reconstruction technique. COMPLICATIONS: None FINDINGS: Pleural masses are again noted in the right hemithorax predominantly in the posterior and lateral pleural space as noted by recent imaging. Nodular pleural mass in the posterior pleural space was targeted from an oblique approach. There was difficulty in accurate placement of the guide trocar needle due to patient movement and irritability despite sedation. Ultimately, accurate needle placement was able to be achieved. The first core biopsy pass did not yield significant tissue. The second core biopsy pass did yield an intact core biopsy sample.  Post biopsy imaging shows no evidence of pneumothorax or hemothorax. IMPRESSION: CT-guided core biopsy performed of a posterior right pleural mass. Accurate biopsy was difficult due to patient movement during the procedure but an intact core biopsy sample was able to be obtained. Electronically Signed   By: Irish Lack M.D.   On: 05/26/2023 11:39    ASSESSMENT AND PLAN: This is a very pleasant 89 years old white female diagnosed with Recurrent and metastatic Non-small cell lung cancer, adenocarcinoma.  She was initially diagnosed in 2013 status post right middle lobe resection, she was found to have recurrent disease in 2024 with extensive pleural nodularity in the right hemithorax without any nodal or pulmonary parenchymal involvement.  There is no evidence of extrathoracic metastatic disease. Molecular studies showed positive EGFR mutation in exon 21 with L858R as well as T790M mutation.  PD-L1 expression was 55%.    Metastatic Lung Adenocarcinoma Recurrence of adenocarcinoma with EGFR mutation (exon 21 L858R and T790M) after right middle lobectomy in 2013. Patient experiencing increased pain on the right side of the chest and lower back. -Start Tagrisso (osimertinib) 80mg  daily. -Monitor for side  effects including skin rash, diarrhea, lung inflammation, and cardiac electrical changes. -Perform EKG prior to starting treatment. -Check in 3 weeks to assess response and tolerance to treatment.  Pain Management Increased pain on the right side of the chest and lower back, managed with half a Percocet (2.5mg ) as needed, causing drowsiness. -Continue half to whole Percocet (2.5-5mg ) every 6-8 hours as needed for pain. -Encourage regular dosing for better pain control.  Shortness of Breath Difficulty with ambulation due to shortness of breath. -Encourage regular exercise as tolerated. -Continue monitoring.   The patient was advised to call immediately if she has any concerning symptoms in the interval. The patient voices understanding of current disease status and treatment options and is in agreement with the current care plan.  All questions were answered. The patient knows to call the clinic with any problems, questions or concerns. We can certainly see the patient much sooner if necessary.  The total time spent in the appointment was 55 minutes.  Disclaimer: This note was dictated with voice recognition software. Similar sounding words can inadvertently be transcribed and may not be corrected upon review.

## 2023-06-19 NOTE — Telephone Encounter (Signed)
Oral Oncology Patient Advocate Encounter  Prior Authorization for [name] has been approved.    PA# [id number] Effective dates: 06/19/23 through 06/18/24  Patients co-pay is $11.20.    Jinger Neighbors, CPhT-Adv Oncology Pharmacy Patient Advocate [facility] Direct Number: [phone]  Fax: [phone]

## 2023-06-20 ENCOUNTER — Ambulatory Visit: Payer: Medicare Other | Attending: Internal Medicine

## 2023-06-20 ENCOUNTER — Other Ambulatory Visit: Payer: Self-pay

## 2023-06-21 LAB — DIGOXIN LEVEL: Digoxin, Serum: 0.9 ng/mL (ref 0.5–0.9)

## 2023-06-24 ENCOUNTER — Other Ambulatory Visit: Payer: Self-pay | Admitting: Medical Oncology

## 2023-06-24 DIAGNOSIS — C342 Malignant neoplasm of middle lobe, bronchus or lung: Secondary | ICD-10-CM

## 2023-06-24 MED ORDER — DIGOXIN 125 MCG PO TABS: ORAL_TABLET | ORAL | Status: DC

## 2023-06-24 MED ORDER — OSIMERTINIB MESYLATE 40 MG PO TABS: 80.0000 mg | ORAL_TABLET | Freq: Every day | ORAL | 0 refills | Status: DC

## 2023-06-24 NOTE — Telephone Encounter (Signed)
Patient currently on Digoxin 0.125mg  6 days weekly for total weekly dose of 0.75mg .   Recommendation to cut dose 15-30% before starting Tagrisso. Dosing range 0.525mg  weekly to 0.675mg  weekly  Will have patient hold digoxin 0.125mg  on Wednesday and Sunday and continue 0.125mg  all other days of the week.  Called and spoke with daughter and explained new dosing schedule. Will recheck digoxin levels 1 week after starting Tagrisso.

## 2023-06-25 ENCOUNTER — Other Ambulatory Visit: Payer: Self-pay

## 2023-06-25 ENCOUNTER — Other Ambulatory Visit (HOSPITAL_COMMUNITY): Payer: Self-pay

## 2023-06-25 ENCOUNTER — Other Ambulatory Visit: Payer: Self-pay | Admitting: Pharmacy Technician

## 2023-06-25 ENCOUNTER — Other Ambulatory Visit: Payer: Self-pay | Admitting: Internal Medicine

## 2023-06-25 LAB — CUP PACEART INCLINIC DEVICE CHECK
Date Time Interrogation Session: 20241108120646
Implantable Lead Connection Status: 753985
Implantable Lead Connection Status: 753985
Implantable Lead Implant Date: 20190909
Implantable Lead Implant Date: 20190909
Implantable Lead Location: 753859
Implantable Lead Location: 753860
Implantable Lead Model: 3830
Implantable Pulse Generator Implant Date: 20190909
Pulse Gen Model: 2272
Pulse Gen Serial Number: 9061801

## 2023-06-25 MED ORDER — OSIMERTINIB MESYLATE 80 MG PO TABS
80.0000 mg | ORAL_TABLET | Freq: Every day | ORAL | 3 refills | Status: DC
  Filled 2023-06-25: qty 30, 30d supply, fill #0
  Filled 2023-07-21: qty 30, 30d supply, fill #1
  Filled 2023-08-12: qty 30, 30d supply, fill #2

## 2023-06-25 NOTE — Telephone Encounter (Signed)
Oral Chemotherapy Pharmacist Encounter   Tricia Hernandez is being shipped to patient's home on 06/25/23 and should arrive 06/27/23.  Patient will start first dose of Tagrisso 06/27/23.  Patient's daughter [name] has no further questions at this time - she knows to reach out to our office with any concerns.   Lenord Carbo, PharmD, BCPS, [facility] Hematology/Oncology Clinical Pharmacist [facility] Oral Chemotherapy Navigation Clinics [phone] 06/25/2023 9:13 AM

## 2023-06-25 NOTE — Progress Notes (Signed)
Specialty Pharmacy Initial Fill Coordination Note  Tricia Hernandez is a 89 y.o. female contacted today regarding refills of specialty medication(s) Osimertinib Mesylate .  Patient requested Delivery  on 06/27/23  to verified address [address]   [place] [address]   Medication will be filled on 06/25/23.   Patient is aware of $11.20 copayment.

## 2023-06-25 NOTE — Telephone Encounter (Signed)
Recommend rechecking digoxin levels 11/22 in the morning before taking digoxin dose

## 2023-06-25 NOTE — Progress Notes (Signed)
Oral Chemotherapy Pharmacist Encounter  Patient was counseled under telephone encounter from 06/19/23.  Lenord Carbo, PharmD, BCPS, [facility] Hematology/Oncology Clinical Pharmacist [facility] Oral Chemotherapy Navigation Clinics [phone] 06/25/2023 9:19 AM

## 2023-06-26 ENCOUNTER — Encounter (HOSPITAL_COMMUNITY): Payer: Self-pay

## 2023-06-26 ENCOUNTER — Telehealth: Payer: Self-pay | Admitting: Medical Oncology

## 2023-06-26 ENCOUNTER — Encounter: Payer: Self-pay | Admitting: Medical Oncology

## 2023-06-26 NOTE — Telephone Encounter (Signed)
Dtr notified that it is ok to start Tagrisso.

## 2023-07-07 ENCOUNTER — Inpatient Hospital Stay: Payer: Medicare Other

## 2023-07-07 ENCOUNTER — Telehealth: Payer: Self-pay | Admitting: Pulmonary Disease

## 2023-07-07 ENCOUNTER — Inpatient Hospital Stay (HOSPITAL_BASED_OUTPATIENT_CLINIC_OR_DEPARTMENT_OTHER): Payer: Medicare Other | Admitting: Internal Medicine

## 2023-07-07 VITALS — BP 116/63 | HR 82 | Temp 97.9°F | Resp 17 | Ht 64.0 in | Wt 234.4 lb

## 2023-07-07 DIAGNOSIS — C342 Malignant neoplasm of middle lobe, bronchus or lung: Secondary | ICD-10-CM

## 2023-07-07 DIAGNOSIS — C3481 Malignant neoplasm of overlapping sites of right bronchus and lung: Secondary | ICD-10-CM

## 2023-07-07 LAB — CMP (CANCER CENTER ONLY)
ALT: 6 U/L (ref 0–44)
AST: 13 U/L — ABNORMAL LOW (ref 15–41)
Albumin: 3.9 g/dL (ref 3.5–5.0)
Alkaline Phosphatase: 101 U/L (ref 38–126)
Anion gap: 9 (ref 5–15)
BUN: 16 mg/dL (ref 8–23)
CO2: 33 mmol/L — ABNORMAL HIGH (ref 22–32)
Calcium: 9 mg/dL (ref 8.9–10.3)
Chloride: 99 mmol/L (ref 98–111)
Creatinine: 1.26 mg/dL — ABNORMAL HIGH (ref 0.44–1.00)
GFR, Estimated: 41 mL/min — ABNORMAL LOW
Glucose, Bld: 116 mg/dL — ABNORMAL HIGH (ref 70–99)
Potassium: 3.5 mmol/L (ref 3.5–5.1)
Sodium: 141 mmol/L (ref 135–145)
Total Bilirubin: 0.3 mg/dL
Total Protein: 6.7 g/dL (ref 6.5–8.1)

## 2023-07-07 LAB — CBC WITH DIFFERENTIAL (CANCER CENTER ONLY)
Abs Immature Granulocytes: 0.02 10*3/uL (ref 0.00–0.07)
Basophils Absolute: 0 10*3/uL (ref 0.0–0.1)
Basophils Relative: 0 %
Eosinophils Absolute: 0.3 10*3/uL (ref 0.0–0.5)
Eosinophils Relative: 4 %
HCT: 35.4 % — ABNORMAL LOW (ref 36.0–46.0)
Hemoglobin: 11.1 g/dL — ABNORMAL LOW (ref 12.0–15.0)
Immature Granulocytes: 0 %
Lymphocytes Relative: 13 %
Lymphs Abs: 1.1 10*3/uL (ref 0.7–4.0)
MCH: 26.9 pg (ref 26.0–34.0)
MCHC: 31.4 g/dL (ref 30.0–36.0)
MCV: 85.9 fL (ref 80.0–100.0)
Monocytes Absolute: 0.4 10*3/uL (ref 0.1–1.0)
Monocytes Relative: 5 %
Neutro Abs: 6.5 10*3/uL (ref 1.7–7.7)
Neutrophils Relative %: 78 %
Platelet Count: 100 10*3/uL — ABNORMAL LOW (ref 150–400)
RBC: 4.12 MIL/uL (ref 3.87–5.11)
RDW: 16.3 % — ABNORMAL HIGH (ref 11.5–15.5)
WBC Count: 8.3 10*3/uL (ref 4.0–10.5)
nRBC: 0 % (ref 0.0–0.2)

## 2023-07-07 NOTE — Telephone Encounter (Signed)
Pt calling in about her medications

## 2023-07-07 NOTE — Progress Notes (Signed)
[facility] [phone]   [phone]  OFFICE PROGRESS NOTE  Tricia Bass, FNP [address] [place] [address]  DIAGNOSIS: Recurrent and metastatic Non-small cell lung cancer, adenocarcinoma.  She was initially diagnosed in 2013 status post right middle lobe resection, she was found to have recurrent disease in 2024 with extensive pleural nodularity in the right hemithorax without any nodal or pulmonary parenchymal involvement.  There is no evidence of extrathoracic metastatic disease.   Biomarker Findings HRD signature - HRDsig Negative Microsatellite status - MS-Stable Tumor Mutational Burden - 4 Muts/Mb Genomic Findings For a complete list of the genes assayed, please refer to the Appendix. EGFR L858R, T790M PBRM1 [id number]*49 RB1 R455* TP53 C141W 7 Disease relevant genes with no reportable alterations: ALK, BRAF, ERBB2, KRAS, MET, RET, ROS1 PD-L1 expression 55% PRIOR THERAPY: anal cancer treated with radiation and surgery in 2017    CURRENT THERAPY: Tagrisso 80 mg p.o. daily, first dose June 27, 2023  INTERVAL HISTORY: [facility] 89 y.o. female returns to the clinic today for follow-up visit accompanied by her daughter.Discussed the use of AI scribe software for clinical note transcription with the patient, who gave verbal consent to proceed.  History of Present Illness   [facility], an 89 year old patient with a history of metastatic non-small cell lung cancer (adenocarcinoma), was diagnosed in October 2024. The disease was initially identified in 2013 when the patient underwent a lung resection. The tumor was tested and found to have two EGFR mutations, one sensitive and one resistant (L858R and T790M). Consequently, the patient was started on Tagrisso, 80mg  once a day, on November 15th.  Since starting the medication, the patient has experienced a skin rash on the nose and cheeks. No other  rashes or adverse effects have been observed. The patient also reports an increase in loose stools, which she describes as watery, since starting Tagrisso. This has been managed with one dose of Imodium, although the patient has been hesitant to take more due to fear of constipation.  The patient also reports swelling, particularly noticeable in the mouth. This is attributed to her concurrent congestive heart failure. The patient is currently on Lasix for this condition. Despite these issues, the patient reports an improvement in pain on the right side, which was previously caused by pleural fluid and cancer. The patient attributes this improvement to the new medication.       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 5/2 EGD: grade A reflux esophagitis.  Mild, non-hemorrhagic gastritis.  Ablation of 3 Duodenal AVMs   Herpes zoster    History of blood transfusion    for vaginal, uterine bleeding leading to hysterectomy.  in 12/2016 transfused x 1 for GI bleed.    History of hiatal hernia    Hyperlipidemia    Hypertension  Labyrinthitis    Lung cancer (HCC)    RML   Obesity    On home oxygen therapy    "3L just at night when I sleep" (04/20/2018)   Paroxysmal supraventricular tachycardia    Peptic ulcer of stomach    hx   Pneumonia 1980s X 1; 08/2016   "walking; double"   Presence of permanent cardiac pacemaker 04/20/2018   Pulmonary embolism (HCC) 05/2014   "left lung"   Seasonal allergies     "bad" (04/20/2018)   Vitamin D deficiency     ALLERGIES:  is allergic to penicillins; aleve [naproxen]; aleve [naproxen sodium]; antihistamines, chlorpheniramine-type; aspirin; benadryl [diphenhydramine hcl]; codeine; hydrochlorothiazide; rocephin [ceftriaxone sodium in dextrose]; and sulfa antibiotics.  MEDICATIONS:  Current Outpatient Medications  Medication Sig Dispense Refill   ALLERGY RELIEF 180 MG tablet TAKE 1 TABLET(180 MG) BY MOUTH DAILY 90 tablet 3   anti-nausea (EMETROL) solution Take 10 mLs by mouth every 15 (fifteen) minutes as needed for nausea or vomiting.     arformoterol (BROVANA) 15 MCG/2ML NEBU INHALE CONTENTS OF 1 VIAL VIA NEBULIZER TWICE DAILY 120 mL 11   atorvastatin (LIPITOR) 20 MG tablet TAKE 1 TABLET(20 MG) BY MOUTH AT BEDTIME 90 tablet 3   budesonide (PULMICORT) 0.5 MG/2ML nebulizer solution USE 1 VIAL VIA NEBULIZER TWICE DAILY. 360 mL 11   Calcium 600-200 MG-UNIT tablet Take 1 tablet by mouth at bedtime.      digoxin (LANOXIN) 0.125 MG tablet Take 1 Tablet every day except for Wednesdays and Sundays     diltiazem (CARDIZEM CD) 120 MG 24 hr capsule Take 1 capsule (120 mg total) by mouth daily. 90 capsule 2   ferrous sulfate 325 (65 FE) MG EC tablet Take 325 mg by mouth 2 (two) times daily.     fluticasone (FLONASE) 50 MCG/ACT nasal spray Place 1 spray into both nostrils daily.     furosemide (LASIX) 20 MG tablet Take 1 tablet (20 mg total) by mouth daily. 90 tablet 3   ipratropium-albuterol (DUONEB) 0.5-2.5 (3) MG/3ML SOLN USE 1 VIAL VIA NEBULIZER TWICE DAILY 540 mL 11   levocetirizine (XYZAL) 5 MG tablet Take 1 tablet by mouth daily.     MAGnesium-Oxide 400 (240 Mg) MG tablet Take 1 tablet (400 mg total) by mouth daily. Please contact the office to schedule appointment for additional refills. 90 tablet 1   nitrofurantoin, macrocrystal-monohydrate, (MACROBID) 100 MG capsule Take 1 capsule (100 mg total) by mouth 2 (two) times daily. 14 capsule 0   osimertinib mesylate  (TAGRISSO) 80 MG tablet Take 1 tablet (80 mg total) by mouth daily. 30 tablet 3   oxyCODONE-acetaminophen (PERCOCET/ROXICET) 5-325 MG tablet Take 1 tablet by mouth every 8 (eight) hours as needed for severe pain. 30 tablet 0   PARoxetine (PAXIL) 20 MG tablet Take 1 tablet (20 mg total) by mouth at bedtime. 90 tablet 3   vitamin B-12 (CYANOCOBALAMIN) 1000 MCG tablet Take 1,000 mcg by mouth at bedtime.      Vitamin D, Ergocalciferol, (DRISDOL) 1.25 MG (50000 UNIT) CAPS capsule TAKE 1 CAPSULE BY MOUTH 1 TIME A WEEK 12 capsule 1   YUPELRI 175 MCG/3ML nebulizer solution USE 1 VIAL VIA NEBULIZER DAILY 90 mL 1   No current facility-administered medications for this visit.   Facility-Administered Medications Ordered in Other Visits  Medication Dose Route Frequency Provider Last Rate Last Admin   sodium phosphate (FLEET) 7-19 GM/118ML enema 2 enema  2 enema Rectal Once Andria Meuse, MD  SURGICAL HISTORY:  Past Surgical History:  Procedure Laterality Date   APPENDECTOMY  1959   BASAL CELL CARCINOMA EXCISION     nose x 2   BREAST CYST ASPIRATION Right 2016?   CATARACT EXTRACTION W/ INTRAOCULAR LENS  IMPLANT, BILATERAL Bilateral 1990s   CHOLECYSTECTOMY OPEN  1980s   COLON SURGERY     COLOSTOMY N/A 07/29/2016   Procedure: COLOSTOMY;  Surgeon: Berna Bue, MD;  Location: [facility] OR;  Service: General;  Laterality: N/A;   DILATION AND CURETTAGE OF UTERUS     ESOPHAGOGASTRODUODENOSCOPY N/A 12/11/2016   Procedure: ESOPHAGOGASTRODUODENOSCOPY (EGD);  Surgeon: [name] Fiedler, MD;  [facility];  Service: Endoscopy;  Laterality: N/A;   EVALUATION UNDER ANESTHESIA WITH HEMORRHOIDECTOMY AND PROCTOSCOPY N/A 07/25/2016   Procedure: EXAM UNDER ANESTHESIA, EXCISION PERIANAL MASS.;  Surgeon: Jimmye Norman, MD;  Location: [facility] OR;  Service: General;  Laterality: N/A;  Prone position   FLEXIBLE SIGMOIDOSCOPY N/A 06/16/2017   Procedure: FLEXIBLE SIGMOIDOSCOPY EXAM UNDER ANESHESIA;  Surgeon: Andria Meuse, MD;  Location: [facility];  Service: General;  Laterality: N/A;   INSERT / REPLACE / REMOVE PACEMAKER  04/20/2018   IR RADIOLOGIST EVAL & MGMT  03/25/2017   LAPAROSCOPIC DIVERTED COLOSTOMY N/A 07/29/2016   Procedure: ATTEMPTED LAPAROSCOPIC ASSISTED COLOSTOMY;  Surgeon: Berna Bue, MD;  Location: [facility] OR;  Service: General;  Laterality: N/A;   LUNG REMOVAL, PARTIAL Right 2013   RML mass/carcinoid, Dr Latanya Maudlin   PACEMAKER IMPLANT N/A 04/20/2018   Procedure: PACEMAKER IMPLANT;  Surgeon: Marinus Maw, MD;  Location: [facility] INVASIVE CV LAB;  Service: Cardiovascular;  Laterality: N/A;   SQUAMOUS CELL CARCINOMA EXCISION     rectum, right leg   TONSILLECTOMY AND ADENOIDECTOMY  1960s   VAGINAL HYSTERECTOMY  1959   "partial"    REVIEW OF SYSTEMS:  Constitutional: positive for fatigue Eyes: negative Ears, nose, mouth, throat, and face: negative Respiratory: negative Cardiovascular: negative Gastrointestinal: negative Genitourinary:negative Integument/breast: negative Hematologic/lymphatic: negative Musculoskeletal:negative Neurological: negative Behavioral/Psych: negative Endocrine: negative Allergic/Immunologic: negative   PHYSICAL EXAMINATION: General appearance: alert, cooperative, fatigued, and no distress Head: Normocephalic, without obvious abnormality, atraumatic Neck: no adenopathy, no JVD, supple, symmetrical, trachea midline, and thyroid not enlarged, symmetric, no tenderness/mass/nodules Lymph nodes: Cervical, supraclavicular, and axillary nodes normal. Resp: clear to auscultation bilaterally Back: symmetric, no curvature. ROM normal. No CVA tenderness. Cardio: regular rate and rhythm, S1, S2 normal, no murmur, click, rub or gallop GI: soft, non-tender; bowel sounds normal; no masses,  no organomegaly Extremities: extremities normal, atraumatic, no cyanosis or edema Neurologic: Alert and oriented X 3, normal strength and tone. Normal symmetric reflexes. Normal  coordination and gait  ECOG PERFORMANCE STATUS: 1 - Symptomatic but completely ambulatory  Blood pressure 116/63, pulse 82, temperature 97.9 F (36.6 C), temperature source Temporal, resp. rate 17, height 5\' 4"  (1.626 m), weight 234 lb 6.4 oz (106.3 kg), SpO2 98%.  LABORATORY DATA: Lab Results  Component Value Date   WBC 8.5 06/19/2023   HGB 11.3 (L) 06/19/2023   HCT 35.8 (L) 06/19/2023   MCV 84.8 06/19/2023   PLT 159 06/19/2023      Chemistry      Component Value Date/Time   NA 141 06/19/2023 0938   NA 142 05/07/2022 1010   NA 144 12/30/2016 0850   K 4.4 06/19/2023 0938   K 4.1 12/30/2016 0850   CL 100 06/19/2023 0938   CO2 34 (H) 06/19/2023 0938   CO2 33 (H) 12/30/2016 0850   BUN 22  06/19/2023 0938   BUN 18 05/07/2022 1010   BUN 27.8 (H) 12/30/2016 0850   CREATININE 1.18 (H) 06/19/2023 0938   CREATININE 1.0 12/30/2016 0850      Component Value Date/Time   CALCIUM 9.2 06/19/2023 0938   CALCIUM 9.7 12/30/2016 0850   ALKPHOS 98 06/19/2023 0938   ALKPHOS 46 12/30/2016 0850   AST 14 (L) 06/19/2023 0938   AST 17 12/30/2016 0850   ALT 7 06/19/2023 0938   ALT 10 12/30/2016 0850   BILITOT 0.5 06/19/2023 0938   BILITOT 0.32 12/30/2016 0850       RADIOGRAPHIC STUDIES: CUP PACEART INCLINIC DEVICE CHECK  Result Date: 06/25/2023 checked in office by industry. Changes made. see report.Raj Janus, RN   ASSESSMENT AND PLAN: This is a very pleasant 89 years old white female diagnosed with Recurrent and metastatic Non-small cell lung cancer, adenocarcinoma.  She was initially diagnosed in 2013 status post right middle lobe resection, she was found to have recurrent disease in 2024 with extensive pleural nodularity in the right hemithorax without any nodal or pulmonary parenchymal involvement.  There is no evidence of extrathoracic metastatic disease. Molecular studies showed positive EGFR mutation in exon 21 with L858R as well as T790M mutation.  PD-L1 expression was 55%. The  patient started treatment with Tagrisso 80 mg p.o. daily on June 27, 2023.    Metastatic Non-Small Cell Lung Cancer (NSCLC) with EGFR Mutations Diagnosed with metastatic NSCLC, adenocarcinoma, in October 2024, with initial presentation in 2013. Tumor positive for EGFR mutations L858R (sensitive) and T790M (resistant). Currently on Tagrisso (osimertinib) 80 mg daily since June 27, 2023. Reports mild skin rash on nose and cheeks, and intermittent diarrhea, likely side effects of Tagrisso. No other significant side effects. Weight stable, no new pain, and improvement in right-sided pain from pleural fluid and cancer. Discussed hydrocortisone cream for rash and Imodium for diarrhea. Potential need for clindamycin lotion if rash worsens. - Continue Tagrisso 80 mg daily - Apply hydrocortisone cream to skin rash - Consider clindamycin lotion if rash worsens - Use Imodium as needed for diarrhea - Repeat lab work in 2-3 weeks - Follow-up in 2-3 weeks  Congestive Heart Failure (CHF) Congestive heart failure causing leg swelling. Managed by cardiologist. On Lasix (furosemide) for fluid management. Advised to elevate legs to reduce swelling. - Continue current management with cardiologist - Elevate legs to reduce swelling  General Health Maintenance No specific issues discussed.  Follow-up - Follow-up appointment in 2-3 weeks - Review lab results when available and contact if concerning.   The patient was advised to call immediately if she has any other concerning symptoms in the interval. The patient voices understanding of current disease status and treatment options and is in agreement with the current care plan.  All questions were answered. The patient knows to call the clinic with any problems, questions or concerns. We can certainly see the patient much sooner if necessary.  The total time spent in the appointment was 30 minutes.  Disclaimer: This note was dictated with voice  recognition software. Similar sounding words can inadvertently be transcribed and may not be corrected upon review.

## 2023-07-08 ENCOUNTER — Other Ambulatory Visit: Payer: Self-pay | Admitting: Pharmacist

## 2023-07-08 ENCOUNTER — Telehealth: Payer: Self-pay | Admitting: Pharmacist

## 2023-07-08 DIAGNOSIS — I480 Paroxysmal atrial fibrillation: Secondary | ICD-10-CM

## 2023-07-08 NOTE — Telephone Encounter (Signed)
[name], daughter, called back. Will bring patient to [facility] on Friday to update digoxin levels

## 2023-07-08 NOTE — Telephone Encounter (Signed)
Called daughter and LMOM advising that digoxin labs were not drawn with the rest of mother's lab work. Advised she could have drawn at any [facility] location preferably in the morning

## 2023-07-09 NOTE — Telephone Encounter (Signed)
I spoke with the patients daughter. She states that the pharmacy has told them that the insurance doesn't cover it and also that they do not carry the pulmicort and yuperli. I told the patients daughter that I would call pharmacy but before I do she wants to know if you were going to change any of them since she now has stage 4 lung cancer. Please advise. Daughter has asked that we leave a detailed message when resolved.

## 2023-07-14 ENCOUNTER — Other Ambulatory Visit (HOSPITAL_COMMUNITY): Payer: Self-pay

## 2023-07-15 ENCOUNTER — Other Ambulatory Visit: Payer: Self-pay | Admitting: Pharmacist

## 2023-07-15 ENCOUNTER — Telehealth: Payer: Self-pay | Admitting: Pharmacist

## 2023-07-15 DIAGNOSIS — Z79899 Other long term (current) drug therapy: Secondary | ICD-10-CM

## 2023-07-15 DIAGNOSIS — I480 Paroxysmal atrial fibrillation: Secondary | ICD-10-CM

## 2023-07-15 NOTE — Telephone Encounter (Signed)
Patient's daughter says [facility] does not have order for digoxin level. Looked and previous order was canceled. Placing order again

## 2023-07-16 ENCOUNTER — Other Ambulatory Visit: Payer: Self-pay

## 2023-07-17 ENCOUNTER — Other Ambulatory Visit (HOSPITAL_COMMUNITY): Payer: Self-pay

## 2023-07-17 MED ORDER — TRELEGY ELLIPTA 200-62.5-25 MCG/ACT IN AEPB
1.0000 | INHALATION_SPRAY | Freq: Every day | RESPIRATORY_TRACT | 3 refills | Status: AC
  Filled 2023-07-17 – 2023-07-22 (×2): qty 180, 90d supply, fill #0
  Filled 2023-11-10 – 2023-12-03 (×2): qty 180, 90d supply, fill #1

## 2023-07-17 NOTE — Telephone Encounter (Signed)
I called and discussed with the patient. Will discontinue the brovana, pulmicort and yupelri nebs since it is not covered by insurance. Start trelegy 200 inhaler. Orders have been sent in. Nothing further needed

## 2023-07-18 ENCOUNTER — Telehealth: Payer: Self-pay | Admitting: Pharmacist

## 2023-07-18 ENCOUNTER — Other Ambulatory Visit: Payer: Self-pay

## 2023-07-18 NOTE — Telephone Encounter (Signed)
Called and spoke with [facility] and recommended to try to update digoxin levels before next oncology appointment. Also wrote on appt notes for oncology lab appt to please check dignoxin levels.

## 2023-07-21 ENCOUNTER — Other Ambulatory Visit: Payer: Self-pay

## 2023-07-21 ENCOUNTER — Other Ambulatory Visit (HOSPITAL_COMMUNITY): Payer: Self-pay

## 2023-07-21 ENCOUNTER — Other Ambulatory Visit: Payer: Self-pay | Admitting: *Deleted

## 2023-07-21 MED ORDER — DILTIAZEM HCL ER COATED BEADS 120 MG PO CP24: 120.0000 mg | ORAL_CAPSULE | Freq: Every day | ORAL | 2 refills | Status: AC

## 2023-07-21 NOTE — Progress Notes (Signed)
Specialty Pharmacy Ongoing Clinical Assessment Note  [facility] is a 89 y.o. female who is being followed by the specialty pharmacy service for RxSp Oncology   Patient's specialty medication(s) reviewed today: Osimertinib Mesylate   Missed doses in the last 4 weeks: 0   Patient/Caregiver did not have any additional questions or concerns.   Therapeutic benefit summary: Patient is achieving benefit   Adverse events/side effects summary: Experienced adverse events/side effects (rash on face, treating with hyrdrocortisone, MD is aware)   Patient's therapy is appropriate to: Continue    Goals Addressed             This Visit's Progress    Slow Disease Progression   No change    Patient is initiating therapy. Patient will maintain adherence.         Follow up:  3 months  Servando Snare Specialty Pharmacist

## 2023-07-21 NOTE — Progress Notes (Signed)
Specialty Pharmacy Refill Coordination Note  [facility] is a 89 y.o. female contacted today regarding refills of specialty medication(s) Osimertinib Mesylate   Patient requested Delivery   Delivery date: 07/22/23   Verified address: [address]  [place] [address]   Medication will be filled on 07/21/23.

## 2023-07-22 ENCOUNTER — Other Ambulatory Visit: Payer: Self-pay

## 2023-07-22 ENCOUNTER — Other Ambulatory Visit (HOSPITAL_COMMUNITY): Payer: Self-pay

## 2023-07-26 NOTE — Progress Notes (Unsigned)
[facility] OFFICE PROGRESS NOTE  [name], FNP [address] [place] [address]  DIAGNOSIS: Recurrent and metastatic Non-small cell lung cancer, adenocarcinoma.  She was initially diagnosed in 2013 status post right middle lobe resection, she was found to have recurrent disease in 2024 with extensive pleural nodularity in the right hemithorax without any nodal or pulmonary parenchymal involvement.  There is no evidence of extrathoracic metastatic disease.   Biomarker Findings HRD signature - HRDsig Negative Microsatellite status - MS-Stable Tumor Mutational Burden - 4 Muts/Mb Genomic Findings For a complete list of the genes assayed, please refer to the Appendix. EGFR L858R, T790M PBRM1 E1032fs*49 RB1 R455* TP53 C141W 7 Disease relevant genes with no reportable alterations: ALK, BRAF, ERBB2, KRAS, MET, RET, ROS1 PD-L1 expression 55%  PRIOR THERAPY: anal cancer treated with radiation and surgery in 2017   CURRENT THERAPY: Tagrisso 80 mg p.o. daily, first dose June 27, 2023. Status post 1 month.   INTERVAL HISTORY: ADE DEITERING 89 y.o. female returns to the clinic today for follow-up visit.  The patient was last seen in the clinic on 07/07/2023 by Dr. Arbutus Ped.  The patient is currently on targeted treatment with Tagrisso.  She has been on this for approximately 1 month and she is tolerated it fair except for a rash on her nose and cheeks.  Her rash has ***at this time.  She also was endorsing some watery stools at her last appointment which was managed effectively with 1 dose of Imodium.  Swelling?  Congestive heart failure?  Lasix?  ***she was also prescribed clindamycin lotion and hydrocortisone cream.   She denies any fever, chills, night sweats, or unexplained weight loss.  Her breathing is***.  Cough?  She denies any hemoptysis.  Chest pain improving?  She denies any nausea, vomiting, or abdominal pain.  She denies any  headache or visual changes.  She is here today for evaluation and repeat blood work.  ***Make sure digoxin level drawn.     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 5/2 EGD: grade A reflux esophagitis.  Mild, non-hemorrhagic gastritis.  Ablation of 3 Duodenal AVMs   Herpes zoster    History of blood transfusion    for vaginal, uterine bleeding leading to hysterectomy.  in 12/2016 transfused x 1 for GI bleed.    History of hiatal hernia    Hyperlipidemia    Hypertension    Labyrinthitis    Lung cancer (HCC)    RML   Obesity    On home oxygen therapy    "3L just at night when I sleep" (04/20/2018)   Paroxysmal supraventricular tachycardia    Peptic ulcer of stomach    hx   Pneumonia 1980s X 1; 08/2016   "walking; double"   Presence of permanent cardiac pacemaker 04/20/2018   Pulmonary embolism (HCC) 05/2014   "  left lung"   Seasonal allergies    "bad" (04/20/2018)   Vitamin D deficiency     ALLERGIES:  is allergic to penicillins; aleve [naproxen]; aleve [naproxen sodium]; antihistamines, chlorpheniramine-type; aspirin; benadryl [diphenhydramine hcl]; codeine; hydrochlorothiazide; rocephin [ceftriaxone sodium in dextrose]; and sulfa antibiotics.  MEDICATIONS:  Current Outpatient Medications  Medication Sig Dispense Refill   ALLERGY RELIEF 180 MG tablet TAKE 1 TABLET(180 MG) BY MOUTH DAILY 90 tablet 3   anti-nausea (EMETROL) solution Take 10 mLs by mouth every 15 (fifteen) minutes as needed  for nausea or vomiting.     atorvastatin (LIPITOR) 20 MG tablet TAKE 1 TABLET(20 MG) BY MOUTH AT BEDTIME 90 tablet 3   Calcium 600-200 MG-UNIT tablet Take 1 tablet by mouth at bedtime.      digoxin (LANOXIN) 0.125 MG tablet Take 1 Tablet every day except for Wednesdays and Sundays     diltiazem (CARDIZEM CD) 120 MG 24 hr capsule Take 1 capsule (120 mg total) by mouth daily. 90 capsule 2   ferrous sulfate 325 (65 FE) MG EC tablet Take 325 mg by mouth 2 (two) times daily.     fluticasone (FLONASE) 50 MCG/ACT nasal spray Place 1 spray into both nostrils daily.     Fluticasone-Umeclidin-Vilant (TRELEGY ELLIPTA) 200-62.5-25 MCG/ACT AEPB Inhale 1 puff into the lungs daily. 180 each 3   furosemide (LASIX) 20 MG tablet Take 1 tablet (20 mg total) by mouth daily. 90 tablet 3   ipratropium-albuterol (DUONEB) 0.5-2.5 (3) MG/3ML SOLN USE 1 VIAL VIA NEBULIZER TWICE DAILY 540 mL 11   levocetirizine (XYZAL) 5 MG tablet Take 1 tablet by mouth daily.     MAGnesium-Oxide 400 (240 Mg) MG tablet Take 1 tablet (400 mg total) by mouth daily. Please contact the office to schedule appointment for additional refills. 90 tablet 1   nitrofurantoin, macrocrystal-monohydrate, (MACROBID) 100 MG capsule Take 1 capsule (100 mg total) by mouth 2 (two) times daily. 14 capsule 0   osimertinib mesylate (TAGRISSO) 80 MG tablet Take 1 tablet (80 mg total) by mouth daily. 30 tablet 3   oxyCODONE-acetaminophen (PERCOCET/ROXICET) 5-325 MG tablet Take 1 tablet by mouth every 8 (eight) hours as needed for severe pain. 30 tablet 0   PARoxetine (PAXIL) 20 MG tablet Take 1 tablet (20 mg total) by mouth at bedtime. 90 tablet 3   vitamin B-12 (CYANOCOBALAMIN) 1000 MCG tablet Take 1,000 mcg by mouth at bedtime.      Vitamin D, Ergocalciferol, (DRISDOL) 1.25 MG (50000 UNIT) CAPS capsule TAKE 1 CAPSULE BY MOUTH 1 TIME A WEEK 12 capsule 1   No current facility-administered medications for this visit.   Facility-Administered Medications Ordered in  Other Visits  Medication Dose Route Frequency Provider Last Rate Last Admin   sodium phosphate (FLEET) 7-19 GM/118ML enema 2 enema  2 enema Rectal Once Andria Meuse, MD        SURGICAL HISTORY:  Past Surgical History:  Procedure Laterality Date   APPENDECTOMY  1959   BASAL CELL CARCINOMA EXCISION     nose x 2   BREAST CYST ASPIRATION Right 2016?   CATARACT EXTRACTION W/ INTRAOCULAR LENS  IMPLANT, BILATERAL Bilateral 1990s   CHOLECYSTECTOMY OPEN  1980s   COLON SURGERY     COLOSTOMY N/A 07/29/2016   Procedure: COLOSTOMY;  Surgeon: Berna Bue, MD;  Location: [facility] OR;  Service: General;  Laterality: N/A;   DILATION AND CURETTAGE OF UTERUS     ESOPHAGOGASTRODUODENOSCOPY N/A 12/11/2016   Procedure: ESOPHAGOGASTRODUODENOSCOPY (EGD);  Surgeon: Vonna Kotyk  Nyra Capes, MD;  Location: [facility] END[facility]COPY;  Service: Endoscopy;  Laterality: N/A;   EVALUATION UNDER ANESTHESIA WITH HEMORRHOIDECTOMY AND PROCTOSCOPY N/A 07/25/2016   Procedure: EXAM UNDER ANESTHESIA, EXCISION PERIANAL MASS.;  Surgeon: Jimmye Norman, MD;  Location: [facility] OR;  Service: General;  Laterality: N/A;  Prone position   FLEXIBLE SIGMOIDOSCOPY N/A 06/16/2017   Procedure: FLEXIBLE SIGMOIDOSCOPY EXAM UNDER ANESHESIA;  Surgeon: Andria Meuse, MD;  Location: [facility];  Service: General;  Laterality: N/A;   INSERT / REPLACE / REMOVE PACEMAKER  04/20/2018   IR RADIOLOGIST EVAL & MGMT  03/25/2017   LAPAROSCOPIC DIVERTED COLOSTOMY N/A 07/29/2016   Procedure: ATTEMPTED LAPAROSCOPIC ASSISTED COLOSTOMY;  Surgeon: Berna Bue, MD;  Location: [facility] OR;  Service: General;  Laterality: N/A;   LUNG REMOVAL, PARTIAL Right 2013   RML mass/carcinoid, Dr Latanya Maudlin   PACEMAKER IMPLANT N/A 04/20/2018   Procedure: PACEMAKER IMPLANT;  Surgeon: Marinus Maw, MD;  Location: [facility] INVASIVE CV LAB;  Service: Cardiovascular;  Laterality: N/A;   SQUAMOUS CELL CARCINOMA EXCISION     rectum, right leg   TONSILLECTOMY AND ADENOIDECTOMY  1960s   VAGINAL  HYSTERECTOMY  1959   "partial"    REVIEW OF SYSTEMS:   Review of Systems  Constitutional: Negative for appetite change, chills, fatigue, fever and unexpected weight change.  HENT:   Negative for mouth sores, nosebleeds, sore throat and trouble swallowing.   Eyes: Negative for eye problems and icterus.  Respiratory: Negative for cough, hemoptysis, shortness of breath and wheezing.   Cardiovascular: Negative for chest pain and leg swelling.  Gastrointestinal: Negative for abdominal pain, constipation, diarrhea, nausea and vomiting.  Genitourinary: Negative for bladder incontinence, difficulty urinating, dysuria, frequency and hematuria.   Musculoskeletal: Negative for back pain, gait problem, neck pain and neck stiffness.  Skin: Negative for itching and rash.  Neurological: Negative for dizziness, extremity weakness, gait problem, headaches, light-headedness and seizures.  Hematological: Negative for adenopathy. Does not bruise/bleed easily.  Psychiatric/Behavioral: Negative for confusion, depression and sleep disturbance. The patient is not nervous/anxious.     PHYSICAL EXAMINATION:  There were no vitals taken for this visit.  ECOG PERFORMANCE STATUS: {CHL ONC ECOG [id number]  Physical Exam  Constitutional: Oriented to person, place, and time and well-developed, well-nourished, and in no distress. No distress.  HENT:  Head: Normocephalic and atraumatic.  Mouth/Throat: Oropharynx is clear and moist. No oropharyngeal exudate.  Eyes: Conjunctivae are normal. Right eye exhibits no discharge. Left eye exhibits no discharge. No scleral icterus.  Neck: Normal range of motion. Neck supple.  Cardiovascular: Normal rate, regular rhythm, normal heart sounds and intact distal pulses.   Pulmonary/Chest: Effort normal and breath sounds normal. No respiratory distress. No wheezes. No rales.  Abdominal: Soft. Bowel sounds are normal. Exhibits no distension and no mass. There is no tenderness.   Musculoskeletal: Normal range of motion. Exhibits no edema.  Lymphadenopathy:    No cervical adenopathy.  Neurological: Alert and oriented to person, place, and time. Exhibits normal muscle tone. Gait normal. Coordination normal.  Skin: Skin is warm and dry. No rash noted. Not diaphoretic. No erythema. No pallor.  Psychiatric: Mood, memory and judgment normal.  Vitals reviewed.  LABORATORY DATA: Lab Results  Component Value Date   WBC 8.3 07/07/2023   HGB 11.1 (L) 07/07/2023   HCT 35.4 (L) 07/07/2023   MCV 85.9 07/07/2023   PLT 100 (L) 07/07/2023      Chemistry      Component Value Date/Time   NA 141 07/07/2023  1332   NA 142 05/07/2022 1010   NA 144 12/30/2016 0850   K 3.5 07/07/2023 1332   K 4.1 12/30/2016 0850   CL 99 07/07/2023 1332   CO2 33 (H) 07/07/2023 1332   CO2 33 (H) 12/30/2016 0850   BUN 16 07/07/2023 1332   BUN 18 05/07/2022 1010   BUN 27.8 (H) 12/30/2016 0850   CREATININE 1.26 (H) 07/07/2023 1332   CREATININE 1.0 12/30/2016 0850      Component Value Date/Time   CALCIUM 9.0 07/07/2023 1332   CALCIUM 9.7 12/30/2016 0850   ALKPHOS 101 07/07/2023 1332   ALKPHOS 46 12/30/2016 0850   AST 13 (L) 07/07/2023 1332   AST 17 12/30/2016 0850   ALT 6 07/07/2023 1332   ALT 10 12/30/2016 0850   BILITOT 0.3 07/07/2023 1332   BILITOT 0.32 12/30/2016 0850       RADIOGRAPHIC STUDIES:  No results found.   ASSESSMENT/PLAN:  This is a very pleasant 89 year old Caucasian female diagnosed with recurrent and metastatic non-small cell lung cancer, adenocarcinoma.  She was initially diagnosed in 2013.  She is status post right middle lobe resection.  She is found to have recurrent disease in 2024 with extensive pleural nodularity in the right hemithorax without any notable or pulmonary parenchymal involvement.  There is no evidence of extrathoracic metastatic disease.  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    PD-L1 expression was 55%. The patient started treatment with Tagrisso 80 mg p.o. daily on June 27, 2023.  The patient was seen with Dr. Arbutus Ped today.  Labs were reviewed.  Recommend that she continue on the same treatment at the same dose.  She will continue using hydrocortisone cream and clindamycin cream for her rash.  She will continue using Imodium if needed for diarrhea.  I will arrange for restaging CT scan of the chest in 4 weeks.  We will see her back for follow-up visit 1 week after her scan to review the results in clinic.  Lasix***Swelling ****  The patient was advised to call immediately if she has any concerning symptoms in the interval. The patient voices understanding of current disease status and treatment options and is in agreement with the current care plan. All questions were answered. The patient knows to call the clinic with any problems, questions or concerns. We can certainly see the patient much sooner if necessary    No orders of the defined types were placed in this encounter.    I spent {CHL ONC TIME VISIT - WUJWJ:[id number]} counseling the patient face to face. The total time spent in the appointment was {CHL ONC TIME VISIT - OZHYQ:[id number]}.  Diar Berkel L Akiva Josey, PA-C 07/26/23

## 2023-07-30 ENCOUNTER — Inpatient Hospital Stay (HOSPITAL_BASED_OUTPATIENT_CLINIC_OR_DEPARTMENT_OTHER): Payer: Medicare Other | Admitting: Physician Assistant

## 2023-07-30 ENCOUNTER — Other Ambulatory Visit: Payer: Self-pay

## 2023-07-30 ENCOUNTER — Inpatient Hospital Stay: Payer: Medicare Other | Attending: Hematology and Oncology

## 2023-07-30 VITALS — BP 120/70 | HR 87 | Temp 97.8°F | Resp 17 | Ht 64.0 in | Wt 223.2 lb

## 2023-07-30 DIAGNOSIS — R0609 Other forms of dyspnea: Secondary | ICD-10-CM | POA: Diagnosis not present

## 2023-07-30 DIAGNOSIS — Z886 Allergy status to analgesic agent status: Secondary | ICD-10-CM | POA: Diagnosis not present

## 2023-07-30 DIAGNOSIS — C3400 Malignant neoplasm of unspecified main bronchus: Secondary | ICD-10-CM | POA: Diagnosis not present

## 2023-07-30 DIAGNOSIS — Z885 Allergy status to narcotic agent status: Secondary | ICD-10-CM | POA: Insufficient documentation

## 2023-07-30 DIAGNOSIS — M7989 Other specified soft tissue disorders: Secondary | ICD-10-CM | POA: Insufficient documentation

## 2023-07-30 DIAGNOSIS — Z882 Allergy status to sulfonamides status: Secondary | ICD-10-CM | POA: Diagnosis not present

## 2023-07-30 DIAGNOSIS — R21 Rash and other nonspecific skin eruption: Secondary | ICD-10-CM | POA: Insufficient documentation

## 2023-07-30 DIAGNOSIS — R634 Abnormal weight loss: Secondary | ICD-10-CM | POA: Insufficient documentation

## 2023-07-30 DIAGNOSIS — C342 Malignant neoplasm of middle lobe, bronchus or lung: Secondary | ICD-10-CM | POA: Diagnosis present

## 2023-07-30 DIAGNOSIS — R197 Diarrhea, unspecified: Secondary | ICD-10-CM | POA: Diagnosis not present

## 2023-07-30 DIAGNOSIS — Z9049 Acquired absence of other specified parts of digestive tract: Secondary | ICD-10-CM | POA: Diagnosis not present

## 2023-07-30 DIAGNOSIS — Z5181 Encounter for therapeutic drug level monitoring: Secondary | ICD-10-CM

## 2023-07-30 DIAGNOSIS — I509 Heart failure, unspecified: Secondary | ICD-10-CM | POA: Insufficient documentation

## 2023-07-30 DIAGNOSIS — R110 Nausea: Secondary | ICD-10-CM | POA: Diagnosis not present

## 2023-07-30 DIAGNOSIS — I4891 Unspecified atrial fibrillation: Secondary | ICD-10-CM | POA: Diagnosis not present

## 2023-07-30 DIAGNOSIS — Z888 Allergy status to other drugs, medicaments and biological substances status: Secondary | ICD-10-CM | POA: Diagnosis not present

## 2023-07-30 DIAGNOSIS — Z86711 Personal history of pulmonary embolism: Secondary | ICD-10-CM | POA: Diagnosis not present

## 2023-07-30 DIAGNOSIS — D491 Neoplasm of unspecified behavior of respiratory system: Secondary | ICD-10-CM | POA: Diagnosis not present

## 2023-07-30 DIAGNOSIS — E785 Hyperlipidemia, unspecified: Secondary | ICD-10-CM | POA: Insufficient documentation

## 2023-07-30 DIAGNOSIS — Z85048 Personal history of other malignant neoplasm of rectum, rectosigmoid junction, and anus: Secondary | ICD-10-CM | POA: Diagnosis not present

## 2023-07-30 DIAGNOSIS — C3481 Malignant neoplasm of overlapping sites of right bronchus and lung: Secondary | ICD-10-CM

## 2023-07-30 DIAGNOSIS — Z880 Allergy status to penicillin: Secondary | ICD-10-CM | POA: Diagnosis not present

## 2023-07-30 DIAGNOSIS — Z79899 Other long term (current) drug therapy: Secondary | ICD-10-CM | POA: Insufficient documentation

## 2023-07-30 DIAGNOSIS — Z881 Allergy status to other antibiotic agents status: Secondary | ICD-10-CM | POA: Diagnosis not present

## 2023-07-30 DIAGNOSIS — Z902 Acquired absence of lung [part of]: Secondary | ICD-10-CM | POA: Diagnosis not present

## 2023-07-30 DIAGNOSIS — Z923 Personal history of irradiation: Secondary | ICD-10-CM | POA: Insufficient documentation

## 2023-07-30 DIAGNOSIS — I110 Hypertensive heart disease with heart failure: Secondary | ICD-10-CM | POA: Diagnosis not present

## 2023-07-30 DIAGNOSIS — Z90710 Acquired absence of both cervix and uterus: Secondary | ICD-10-CM | POA: Diagnosis not present

## 2023-07-30 DIAGNOSIS — I480 Paroxysmal atrial fibrillation: Secondary | ICD-10-CM

## 2023-07-30 DIAGNOSIS — I4819 Other persistent atrial fibrillation: Secondary | ICD-10-CM

## 2023-07-30 LAB — CBC WITH DIFFERENTIAL (CANCER CENTER ONLY)
Abs Immature Granulocytes: 0.02 10*3/uL (ref 0.00–0.07)
Basophils Absolute: 0 10*3/uL (ref 0.0–0.1)
Basophils Relative: 0 %
Eosinophils Absolute: 0.1 10*3/uL (ref 0.0–0.5)
Eosinophils Relative: 2 %
HCT: 35.9 % — ABNORMAL LOW (ref 36.0–46.0)
Hemoglobin: 10.9 g/dL — ABNORMAL LOW (ref 12.0–15.0)
Immature Granulocytes: 0 %
Lymphocytes Relative: 11 %
Lymphs Abs: 0.7 10*3/uL (ref 0.7–4.0)
MCH: 25.2 pg — ABNORMAL LOW (ref 26.0–34.0)
MCHC: 30.4 g/dL (ref 30.0–36.0)
MCV: 83.1 fL (ref 80.0–100.0)
Monocytes Absolute: 0.4 10*3/uL (ref 0.1–1.0)
Monocytes Relative: 6 %
Neutro Abs: 5.6 10*3/uL (ref 1.7–7.7)
Neutrophils Relative %: 81 %
Platelet Count: 119 10*3/uL — ABNORMAL LOW (ref 150–400)
RBC: 4.32 MIL/uL (ref 3.87–5.11)
RDW: 15.9 % — ABNORMAL HIGH (ref 11.5–15.5)
WBC Count: 6.9 10*3/uL (ref 4.0–10.5)
nRBC: 0 % (ref 0.0–0.2)

## 2023-07-30 LAB — CMP (CANCER CENTER ONLY)
ALT: 7 U/L (ref 0–44)
AST: 14 U/L — ABNORMAL LOW (ref 15–41)
Albumin: 4.2 g/dL (ref 3.5–5.0)
Alkaline Phosphatase: 103 U/L (ref 38–126)
Anion gap: 8 (ref 5–15)
BUN: 22 mg/dL (ref 8–23)
CO2: 34 mmol/L — ABNORMAL HIGH (ref 22–32)
Calcium: 9.4 mg/dL (ref 8.9–10.3)
Chloride: 100 mmol/L (ref 98–111)
Creatinine: 1.47 mg/dL — ABNORMAL HIGH (ref 0.44–1.00)
GFR, Estimated: 34 mL/min — ABNORMAL LOW (ref >60)
Glucose, Bld: 114 mg/dL — ABNORMAL HIGH (ref 70–99)
Potassium: 3.8 mmol/L (ref 3.5–5.1)
Sodium: 142 mmol/L (ref 135–145)
Total Bilirubin: 0.5 mg/dL (ref <1.2)
Total Protein: 6.7 g/dL (ref 6.5–8.1)

## 2023-07-31 ENCOUNTER — Telehealth: Payer: Self-pay | Admitting: Pharmacist

## 2023-07-31 ENCOUNTER — Telehealth: Payer: Self-pay

## 2023-07-31 ENCOUNTER — Other Ambulatory Visit: Payer: Self-pay | Admitting: Physician Assistant

## 2023-07-31 DIAGNOSIS — I4819 Other persistent atrial fibrillation: Secondary | ICD-10-CM

## 2023-07-31 DIAGNOSIS — I480 Paroxysmal atrial fibrillation: Secondary | ICD-10-CM

## 2023-07-31 NOTE — Telephone Encounter (Signed)
Placing digoxin order

## 2023-07-31 NOTE — Telephone Encounter (Signed)
Patient was seen yesterday and digoxin lab was drew and miscalculated.  Spoke with patients daughter, [name] and informed her.  Since patient lives in [place], [place], order faxed to [facility] on [address]  Fax confirmed at [phone]. Daughter stated she is going to see if her brother can take patient this afternoon.

## 2023-07-31 NOTE — Telephone Encounter (Signed)
Placing standing order for digoxin levels and will fax to [facility]

## 2023-08-12 ENCOUNTER — Other Ambulatory Visit (HOSPITAL_COMMUNITY): Payer: Self-pay

## 2023-08-12 NOTE — Progress Notes (Signed)
 Specialty Pharmacy Refill Coordination Note  Tricia Hernandez is a 89 y.o. female contacted today regarding refills of specialty medication(s) Osimertinib  Mesylate (TAGRISSO )   Patient requested [name] at [facility] at [place] date: 08/19/23   Medication will be filled on 08/18/23.

## 2023-08-19 ENCOUNTER — Other Ambulatory Visit: Payer: Self-pay | Admitting: *Deleted

## 2023-08-19 ENCOUNTER — Ambulatory Visit (HOSPITAL_COMMUNITY)
Admission: RE | Admit: 2023-08-19 | Discharge: 2023-08-19 | Disposition: A | Discharge status: Home / Self Care | Payer: Medicare Other | Source: Ambulatory Visit | Attending: Physician Assistant | Admitting: Physician Assistant

## 2023-08-19 ENCOUNTER — Other Ambulatory Visit (HOSPITAL_COMMUNITY): Payer: Self-pay

## 2023-08-19 ENCOUNTER — Encounter: Payer: Self-pay | Admitting: Internal Medicine

## 2023-08-19 ENCOUNTER — Other Ambulatory Visit: Payer: Self-pay | Admitting: Physician Assistant

## 2023-08-19 ENCOUNTER — Other Ambulatory Visit: Payer: Self-pay

## 2023-08-19 DIAGNOSIS — C3400 Malignant neoplasm of unspecified main bronchus: Secondary | ICD-10-CM

## 2023-08-19 DIAGNOSIS — I4819 Other persistent atrial fibrillation: Secondary | ICD-10-CM

## 2023-08-19 DIAGNOSIS — D491 Neoplasm of unspecified behavior of respiratory system: Secondary | ICD-10-CM

## 2023-08-19 MED ORDER — VITAMIN D (ERGOCALCIFEROL) 1.25 MG (50000 UNIT) PO CAPS: ORAL_CAPSULE | ORAL | 1 refills | Status: AC

## 2023-08-25 LAB — POCT I-STAT CREATININE: Creatinine, Ser: 1.9 mg/dL — ABNORMAL HIGH (ref 0.44–1.00)

## 2023-08-29 ENCOUNTER — Other Ambulatory Visit: Payer: Self-pay | Admitting: Physician Assistant

## 2023-08-29 ENCOUNTER — Telehealth: Payer: Self-pay

## 2023-08-29 NOTE — Telephone Encounter (Signed)
Patients daughter [name] called and stated that the patient seems weak and appetite is suppressed.  [name] stated that she bought protein shakes for the patient and she is drinking those.  Informed [name] that [name] can cause appetite suppression and to encourage patient to drink fluids, eat as best she can and stay mobile. Informed [name] I would relay the message to Dr. Arbutus Ped for any more recommendations.  Patient had appt on 1/20 but possible weather for [facility] so rescheduled patient to 1/27. If at any time patient wants to change to a telephone visit, call and let us know.  [name] was appreciative of the call.

## 2023-09-01 ENCOUNTER — Inpatient Hospital Stay: Payer: Medicare Other | Admitting: Internal Medicine

## 2023-09-01 ENCOUNTER — Ambulatory Visit: Payer: Self-pay | Admitting: Family

## 2023-09-01 ENCOUNTER — Inpatient Hospital Stay: Payer: Medicare Other

## 2023-09-01 ENCOUNTER — Encounter: Payer: Self-pay | Admitting: Family Medicine

## 2023-09-01 ENCOUNTER — Telehealth (INDEPENDENT_AMBULATORY_CARE_PROVIDER_SITE_OTHER): Payer: Medicare Other | Admitting: Family Medicine

## 2023-09-01 DIAGNOSIS — R3989 Other symptoms and signs involving the genitourinary system: Secondary | ICD-10-CM

## 2023-09-01 MED ORDER — CIPROFLOXACIN HCL 250 MG PO TABS: 250.0000 mg | ORAL_TABLET | Freq: Two times a day (BID) | ORAL | 0 refills | Status: AC

## 2023-09-01 NOTE — Telephone Encounter (Signed)
Pt has a VV scheduled today.

## 2023-09-01 NOTE — Telephone Encounter (Signed)
Copied from CRM [id number]. Topic: Clinical - Medical Advice >> Sep 01, 2023 12:54 PM Elizebeth Brooking wrote: Reason for CRM: Patient daughter called in regarding if Vernona Rieger can send something such as a antibiotic over for a UTI, as she stated that it is urgent. Callback number [place] [phone]   Chief Complaint: concern for UTI Symptoms: Foul odor, weakness, and low-grade fever x 3-4 days Frequency: x 3-4 days Pertinent Negatives: Patient denies  Disposition: [] ED /[] Urgent Care (no appt availability in office) / [] Appointment(In office/virtual)/ []  [place] [facility]/ [] Home Care/ [] Refused Recommended Disposition /[] [place] [facility]/ []  Follow-up with PCP Additional Notes: Spoke with daughter, patient has been having foul smelling urine and low grade fever x 3-4 days, patient also feels weak. Virtual appt scheduled as pt feels to sick to come in for visit.    Reason for Disposition  Bad or foul-smelling urine  Answer Assessment - Initial Assessment Questions 1. SYMPTOM: "What's the main symptom you're concerned about?" (e.g., frequency, incontinence)     Very strong odor, low grade fever  2. ONSET: "When did the  odor  start?"     3-4 days ago  3. PAIN: "Is there any pain?" If Yes, ask: "How bad is it?" (Scale: 1-10; mild, moderate, severe)     Doesn't  seem to be in pain  4. CAUSE: "What do you think is causing the symptoms?"     UTI   5. OTHER SYMPTOMS: "Do you have any other symptoms?" (e.g., blood in urine, fever, flank pain, pain with urination)     Weakness, low grade fever, "feels bad"  6. PREGNANCY: "Is there any chance you are pregnant?" "When was your last menstrual period?"     no  Protocols used: Urinary Symptoms-A-AH

## 2023-09-01 NOTE — Progress Notes (Signed)
Virtual Visit via Video Note  I connected with Tricia Hernandez on 09/01/23 at  3:20 PM EST by a video enabled telemedicine application and verified that I am speaking with the correct person using two identifiers.  Patient Location: Home Provider Location: Office/Clinic  I discussed the limitations, risks, security, and privacy concerns of performing an evaluation and management service by video and the availability of in person appointments. I also discussed with the patient that there may be a patient responsible charge related to this service. The patient expressed understanding and agreed to proceed.  Subjective: PCP: [name], FNP  Chief Complaint  Patient presents with   Urinary Tract Infection    Urine odor, dark colored urine, no sensation to feel if burning, some nausea and some low grade fever. Started about 4-5 days ago      Patient complains of abnormal smelling urine, nausea, and dark urine . She has had symptoms for  4-5  days. Patient also complains of fever. Patient denies back pain. Patient does have a history of recurrent UTI.  Patient does not have a history of pyelonephritis.     ROS: Per HPI  Current Outpatient Medications:    ALLERGY RELIEF 180 MG tablet, TAKE 1 TABLET(180 MG) BY MOUTH DAILY, Disp: 90 tablet, Rfl: 3   anti-nausea (EMETROL) solution, Take 10 mLs by mouth every 15 (fifteen) minutes as needed for nausea or vomiting., Disp: , Rfl:    atorvastatin (LIPITOR) 20 MG tablet, TAKE 1 TABLET(20 MG) BY MOUTH AT BEDTIME, Disp: 90 tablet, Rfl: 3   digoxin (LANOXIN) 0.125 MG tablet, Take 1 Tablet every day except for Wednesdays and Sundays, Disp: , Rfl:    diltiazem (CARDIZEM CD) 120 MG 24 hr capsule, Take 1 capsule (120 mg total) by mouth daily., Disp: 90 capsule, Rfl: 2   ferrous sulfate 325 (65 FE) MG EC tablet, Take 325 mg by mouth 2 (two) times daily., Disp: , Rfl:    Fluticasone-Umeclidin-Vilant (TRELEGY ELLIPTA) 200-62.5-25 MCG/ACT AEPB,  Inhale 1 puff into the lungs daily., Disp: 180 each, Rfl: 3   furosemide (LASIX) 20 MG tablet, Take 1 tablet (20 mg total) by mouth daily., Disp: 90 tablet, Rfl: 3   levocetirizine (XYZAL) 5 MG tablet, Take 1 tablet by mouth daily., Disp: , Rfl:    MAGnesium-Oxide 400 (240 Mg) MG tablet, Take 1 tablet (400 mg total) by mouth daily. Please contact the office to schedule appointment for additional refills., Disp: 90 tablet, Rfl: 1   osimertinib mesylate (TAGRISSO) 80 MG tablet, Take 1 tablet (80 mg total) by mouth daily., Disp: 30 tablet, Rfl: 3   PARoxetine (PAXIL) 20 MG tablet, Take 1 tablet (20 mg total) by mouth at bedtime., Disp: 90 tablet, Rfl: 3   vitamin B-12 (CYANOCOBALAMIN) 1000 MCG tablet, Take 1,000 mcg by mouth at bedtime. , Disp: , Rfl:    Vitamin D, Ergocalciferol, (DRISDOL) 1.25 MG (50000 UNIT) CAPS capsule, TAKE 1 CAPSULE BY MOUTH 1 TIME A WEEK, Disp: 12 capsule, Rfl: 1   fluticasone (FLONASE) 50 MCG/ACT nasal spray, Place 1 spray into both nostrils daily. (Patient not taking: Reported on 09/01/2023), Disp: , Rfl:    ipratropium-albuterol (DUONEB) 0.5-2.5 (3) MG/3ML SOLN, USE 1 VIAL VIA NEBULIZER TWICE DAILY (Patient not taking: Reported on 09/01/2023), Disp: 540 mL, Rfl: 11 No current facility-administered medications for this visit.  Facility-Administered Medications Ordered in Other Visits:    sodium phosphate (FLEET) 7-19 GM/118ML enema 2 enema, 2 enema, Rectal, Once, Marin Olp  M, MD  Observations/Objective: There were no vitals filed for this visit. Physical Exam Constitutional:      General: She is not in acute distress.    Appearance: Normal appearance. She is not ill-appearing or toxic-appearing.  Eyes:     General: No scleral icterus.       Right eye: No discharge.        Left eye: No discharge.     Conjunctiva/sclera: Conjunctivae normal.  Pulmonary:     Effort: Pulmonary effort is normal. No respiratory distress.  Neurological:     Mental Status: She is  alert and oriented to person, place, and time.  Psychiatric:        Mood and Affect: Mood normal.        Behavior: Behavior normal.        Thought Content: Thought content normal.        Judgment: Judgment normal.     Assessment and Plan: 1. Suspected UTI (Primary) Education provided on UTIs. Encouraged adequate hydration. Advised to seek in-person appointment if symptoms fail to improve. - ciprofloxacin (CIPRO) 250 MG tablet; Take 1 tablet (250 mg total) by mouth 2 (two) times daily for 7 days.  Dispense: 14 tablet; Refill: 0   Follow Up Instructions: Return if symptoms worsen or fail to improve.   I discussed the assessment and treatment plan with the patient. The patient was provided an opportunity to ask questions, and all were answered. The patient agreed with the plan and demonstrated an understanding of the instructions.   The patient was advised to call back or seek an in-person evaluation if the symptoms worsen or if the condition fails to improve as anticipated.  The above assessment and management plan was discussed with the patient. The patient verbalized understanding of and has agreed to the management plan.   Gwenlyn Fudge, FNP

## 2023-09-08 ENCOUNTER — Inpatient Hospital Stay: Payer: Medicare Other

## 2023-09-08 ENCOUNTER — Telehealth: Payer: Self-pay | Admitting: Internal Medicine

## 2023-09-08 ENCOUNTER — Telehealth: Payer: Self-pay | Admitting: Medical Oncology

## 2023-09-08 ENCOUNTER — Inpatient Hospital Stay: Payer: Medicare Other | Admitting: Internal Medicine

## 2023-09-08 NOTE — Telephone Encounter (Signed)
Pt did not  show up for lab and appt today . I LVM for Tricia Hernandez to call back and r/s appts.

## 2023-09-10 ENCOUNTER — Encounter: Payer: Self-pay | Admitting: Internal Medicine

## 2023-09-10 ENCOUNTER — Telehealth: Payer: Self-pay

## 2023-09-10 ENCOUNTER — Other Ambulatory Visit: Payer: Self-pay

## 2023-09-10 NOTE — Telephone Encounter (Signed)
Spoke with patients daughter [name].  She stated that patient is not eating a lot, very tired and weak.  She asked if the patient could stop taking tagrisso to see if she'll feel better. Per Dr. Arbutus Ped- patient can stop the tagrisso and we can reevaluate on Monday (2/3) at her appt.

## 2023-09-12 NOTE — Progress Notes (Unsigned)
[facility] OFFICE PROGRESS NOTE  Tricia Bass, FNP [address] [place] [address]  DIAGNOSIS: Recurrent and metastatic Non-small cell lung cancer, adenocarcinoma.  She was initially diagnosed in 2013 status post right middle lobe resection, she was found to have recurrent disease in 2024 with extensive pleural nodularity in the right hemithorax without any nodal or pulmonary parenchymal involvement.  There is no evidence of extrathoracic metastatic disease.   Biomarker Findings HRD signature - HRDsig Negative Microsatellite status - MS-Stable Tumor Mutational Burden - 4 Muts/Mb Genomic Findings For a complete list of the genes assayed, please refer to the Appendix. EGFR L858R, T790M PBRM1 E101fs*49 RB1 R455* TP53 C141W 7 Disease relevant genes with no reportable alterations: ALK, BRAF, ERBB2, KRAS, MET, RET, ROS1 PD-L1 expression 55%    PRIOR THERAPY:  anal cancer treated with radiation and surgery in 2017   CURRENT THERAPY: Tagrisso 80 mg p.o. daily, first dose June 27, 2023. Status post 3.5 months.   INTERVAL HISTORY: Tricia Hernandez 89 y.o. female returns to the clinic today for a follow-up visit accompanied by her daughter.  The patient was last seen in the clinic on 07/29/24.  In the interval since last being seen the patient's been having some increased weakness, fatigue/sleeping a lot, and decreased oral intake. She had been on tagrisso since November and had tolerated it well up until this time. Started about 2 to 2-1/2 weeks ago.  Therefore she has held her Tagrisso for a few days to see if this improves her symptoms.  She has held her Tagrisso for approximately 1 week.  Since holding her Tagrisso she is feeling about 75% better.   However, the patient was also having increased malodorous urine.  The patient had a telehealth visit with her PCP.  The patient is incontinent (had prior radiation and surgery due to anal cancer  and has decreased sensation). She did not give a urine specimen was treated with Cipro for possible UTI and her urine is no longer malodorous. Her daughter mentions some mental status changes during this time/possible dementia. She is wondering if related to Tagrisso.  Her restaging CT scan mentions some air in the vagina and bladder.  The patient denies any recent instrumentatio  in the bladder. She had a urologist in the past for recurrent UTIs but would like to be referred to [facility].   Her daughter has been giving her Premier protein drinks.   Regarding her Tagrisso, the patient takes Imodium once every 3 days or so for loose/runny stool.  She also reports some dyspnea on exertion.  This is started even prior to her recent restaging scan that did not show any acute changes in her lung.  It actually showed improvement in her disease.  He denies any significant cough.  She may have cough if she uses her inhaler.  Denies any hemoptysis or chest pain.  Denies any nausea or vomiting.  The rash on her face has improved.  They use hydrocortisone cream when needed.  Denies any headache or visual changes.  She recently had a restaging CT scan.  She is here today for evaluation and to review her scan results.  Of note she is on digoxin by cardiology and they are monitoring her level.  Her digoxin is pending from today.     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 5/2 EGD: grade A reflux esophagitis.  Mild,  non-hemorrhagic gastritis.  Ablation of 3 Duodenal AVMs   Herpes zoster    History of blood transfusion    for vaginal, uterine bleeding leading to hysterectomy.  in 12/2016 transfused x 1 for GI bleed.    History of hiatal hernia    Hyperlipidemia    Hypertension    Labyrinthitis    Lung cancer (HCC)    RML   Obesity    On home oxygen therapy    "3L just at night when I sleep" (04/20/2018)   Paroxysmal supraventricular tachycardia (HCC)    Peptic ulcer of stomach    hx   Pneumonia 1980s X 1; 08/2016   "walking; double"   Presence of permanent cardiac pacemaker 04/20/2018   Pulmonary embolism (HCC) 05/2014   "left lung"   Seasonal allergies    "bad" (04/20/2018)   Vitamin D deficiency     ALLERGIES:  is allergic to penicillins; aleve [naproxen]; aleve [naproxen sodium]; antihistamines, chlorpheniramine-type; aspirin; benadryl [diphenhydramine hcl]; codeine; hydrochlorothiazide; rocephin [ceftriaxone sodium in dextrose]; and sulfa antibiotics.  MEDICATIONS:  Current Outpatient Medications  Medication Sig Dispense Refill   ALLERGY RELIEF 180 MG tablet TAKE 1 TABLET(180 MG) BY MOUTH DAILY 90 tablet 3   anti-nausea (EMETROL) solution Take 10 mLs by mouth every 15 (fifteen) minutes as needed for nausea or vomiting.     atorvastatin (LIPITOR) 20 MG tablet TAKE 1 TABLET(20 MG) BY MOUTH AT BEDTIME 90 tablet 3   digoxin (LANOXIN) 0.125 MG tablet Take 1 Tablet every day except for Wednesdays and Sundays     diltiazem (CARDIZEM CD) 120 MG 24 hr capsule Take 1 capsule (120 mg total) by mouth daily. 90 capsule 2   ferrous sulfate 325 (65 FE) MG EC tablet Take 325 mg by mouth 2 (two) times daily.     Fluticasone-Umeclidin-Vilant (TRELEGY ELLIPTA) 200-62.5-25 MCG/ACT AEPB Inhale 1 puff into the lungs daily. 180 each 3   furosemide (LASIX) 20 MG tablet Take 1 tablet (20 mg total) by mouth daily. 90 tablet 3   levocetirizine (XYZAL) 5 MG tablet Take 1 tablet by mouth daily.     MAGnesium-Oxide 400  (240 Mg) MG tablet Take 1 tablet (400 mg total) by mouth daily. Please contact the office to schedule appointment for additional refills. 90 tablet 1   PARoxetine (PAXIL) 20 MG tablet Take 1 tablet (20 mg total) by mouth at bedtime. 90 tablet 3   vitamin B-12 (CYANOCOBALAMIN) 1000 MCG tablet Take 1,000 mcg by mouth at bedtime.      Vitamin D, Ergocalciferol, (DRISDOL) 1.25 MG (50000 UNIT) CAPS capsule TAKE 1 CAPSULE BY MOUTH 1 TIME A WEEK 12 capsule 1   fluticasone (FLONASE) 50 MCG/ACT nasal spray Place 1 spray into both nostrils daily. (Patient not taking: Reported on 09/01/2023)     ipratropium-albuterol (DUONEB) 0.5-2.5 (3) MG/3ML SOLN USE 1 VIAL VIA NEBULIZER TWICE DAILY (Patient not taking: Reported on 09/15/2023) 540 mL 11   osimertinib mesylate (TAGRISSO)  40 MG tablet Take 1 tablet (40 mg total) by mouth daily. 30 tablet 2   No current facility-administered medications for this visit.   Facility-Administered Medications Ordered in Other Visits  Medication Dose Route Frequency Provider Last Rate Last Admin   sodium phosphate (FLEET) 7-19 GM/118ML enema 2 enema  2 enema Rectal Once Andria Meuse, MD        SURGICAL HISTORY:  Past Surgical History:  Procedure Laterality Date   APPENDECTOMY  1959   BASAL CELL CARCINOMA EXCISION     nose x 2   BREAST CYST ASPIRATION Right 2016?   CATARACT EXTRACTION W/ INTRAOCULAR LENS  IMPLANT, BILATERAL Bilateral 1990s   CHOLECYSTECTOMY OPEN  1980s   COLON SURGERY     COLOSTOMY N/A 07/29/2016   Procedure: COLOSTOMY;  Surgeon: Berna Bue, MD;  Location: [facility] OR;  Service: General;  Laterality: N/A;   DILATION AND CURETTAGE OF UTERUS     ESOPHAGOGASTRODUODENOSCOPY N/A 12/11/2016   Procedure: ESOPHAGOGASTRODUODENOSCOPY (EGD);  Surgeon: [name] Fiedler, MD;  Location: [facility] ENDOSCOPY;  Service: Endoscopy;  Laterality: N/A;   EVALUATION UNDER ANESTHESIA WITH HEMORRHOIDECTOMY AND PROCTOSCOPY N/A 07/25/2016   Procedure: EXAM UNDER ANESTHESIA, EXCISION  PERIANAL MASS.;  Surgeon: Jimmye Norman, MD;  Location: [facility] OR;  Service: General;  Laterality: N/A;  Prone position   FLEXIBLE SIGMOIDOSCOPY N/A 06/16/2017   Procedure: FLEXIBLE SIGMOIDOSCOPY EXAM UNDER ANESHESIA;  Surgeon: Andria Meuse, MD;  Location: [facility] END[facility];  Service: General;  Laterality: N/A;   INSERT / REPLACE / REMOVE PACEMAKER  04/20/2018   IR RADIOLOGIST EVAL & MGMT  03/25/2017   LAPAROSCOPIC DIVERTED COLOSTOMY N/A 07/29/2016   Procedure: ATTEMPTED LAPAROSCOPIC ASSISTED COLOSTOMY;  Surgeon: Berna Bue, MD;  Location: [facility] OR;  Service: General;  Laterality: N/A;   LUNG REMOVAL, PARTIAL Right 2013   RML mass/carcinoid, Dr Latanya Maudlin   PACEMAKER IMPLANT N/A 04/20/2018   Procedure: PACEMAKER IMPLANT;  Surgeon: Marinus Maw, MD;  Location: [facility] INVASIVE CV LAB;  Service: Cardiovascular;  Laterality: N/A;   SQUAMOUS CELL CARCINOMA EXCISION     rectum, right leg   TONSILLECTOMY AND ADENOIDECTOMY  1960s   VAGINAL HYSTERECTOMY  1959   "partial"    REVIEW OF SYSTEMS:   Review of Systems  Constitutional: Positive for fatigue, weight loss, and appetite change. Negative for chills and fever.  HENT: Negative for mouth sores, nosebleeds, sore throat and trouble swallowing.   Eyes: Negative for eye problems and icterus.  Respiratory: Positive for dyspnea on exertion. Negative for cough, hemoptysis,  and wheezing.   Cardiovascular: Negative for chest pain and leg swelling.  Gastrointestinal: Positive for intermittent loose stool. Negative for abdominal pain, constipation, nausea and vomiting.  Genitourinary: Positive for urinary incontinence. Negative for difficulty urinating, dysuria, frequency and hematuria.   Musculoskeletal: Negative for back pain, gait problem, neck pain and neck stiffness.  Skin: Positive for improving rash on face.  Neurological: Positive for generalized weakness. Negative for dizziness, extremity weakness, gait problem, headaches, light-headedness and seizures.   Hematological: Negative for adenopathy. Does not bruise/bleed easily.  Psychiatric/Behavioral: Negative for confusion, depression and sleep disturbance. The patient is not nervous/anxious.     PHYSICAL EXAMINATION:  Blood pressure 118/64, pulse 100, temperature 97.8 F (36.6 C), temperature source Temporal, resp. rate 16, weight 210 lb 9.6 oz (95.5 kg), SpO2 94%.  ECOG PERFORMANCE STATUS: 2-3  Physical Exam  Constitutional: Oriented to person, place, and time and elderly appearing female and in no distress.  HENT:  Head: Normocephalic  and atraumatic. Hard of hearing.  Mouth/Throat: Oropharynx is clear and moist. No oropharyngeal exudate.  Eyes: Conjunctivae are normal. Right eye exhibits no discharge. Left eye exhibits no discharge. No scleral icterus.  Neck: Normal range of motion. Neck supple.  Cardiovascular: Normal rate, regular rhythm, normal heart sounds and intact distal pulses.   Pulmonary/Chest: Effort normal and breath sounds normal. No respiratory distress. No wheezes. No rales.  Abdominal: Soft. Bowel sounds are normal. Exhibits no distension and no mass. There is no tenderness.  Musculoskeletal: Normal range of motion. Some pitting edema in the lower extremities bilaterally.  Lymphadenopathy:    No cervical adenopathy.  Neurological: Alert and oriented to person, place, and time. Exhibits muscle wasting. She was examined in the wheelchair.  Skin: Skin is warm and dry. No rash noted. Not diaphoretic. No erythema. No pallor.  Psychiatric: Mood, memory and judgment normal.  Vitals reviewed.  LABORATORY DATA: Lab Results  Component Value Date   WBC 8.0 09/15/2023   HGB 11.2 (L) 09/15/2023   HCT 36.5 09/15/2023   MCV 82.6 09/15/2023   PLT 114 (L) 09/15/2023      Chemistry      Component Value Date/Time   NA 139 09/15/2023 1400   NA 142 05/07/2022 1010   NA 144 12/30/2016 0850   K 3.4 (L) 09/15/2023 1400   K 4.1 12/30/2016 0850   CL 96 (L) 09/15/2023 1400   CO2  36 (H) 09/15/2023 1400   CO2 33 (H) 12/30/2016 0850   BUN 39 (H) 09/15/2023 1400   BUN 18 05/07/2022 1010   BUN 27.8 (H) 12/30/2016 0850   CREATININE 1.32 (H) 09/15/2023 1400   CREATININE 1.0 12/30/2016 0850      Component Value Date/Time   CALCIUM 9.5 09/15/2023 1400   CALCIUM 9.7 12/30/2016 0850   ALKPHOS 84 09/15/2023 1400   ALKPHOS 46 12/30/2016 0850   AST 15 09/15/2023 1400   AST 17 12/30/2016 0850   ALT 8 09/15/2023 1400   ALT 10 12/30/2016 0850   BILITOT 0.4 09/15/2023 1400   BILITOT 0.32 12/30/2016 0850       RADIOGRAPHIC STUDIES:  CT CHEST ABDOMEN PELVIS WO CONTRAST Result Date: 08/19/2023 CLINICAL DATA:  History of carcinoid tumor status post lung resection in 2013. History of squamous cell carcinoma of the anus. History of pleural metastasis. * Tracking Code: BO * EXAM: CT CHEST, ABDOMEN AND PELVIS WITHOUT CONTRAST TECHNIQUE: Multidetector CT imaging of the chest, abdomen and pelvis was performed following the standard protocol without IV contrast. RADIATION DOSE REDUCTION: This exam was performed according to the departmental dose-optimization program which includes automated exposure control, adjustment of the mA and/or kV according to patient size and/or use of iterative reconstruction technique. COMPARISON:  Multiple priors including PET-CT April 30, 2023 and chest CT April 08, 2023. FINDINGS: CT CHEST FINDINGS Cardiovascular: Left chest cardiac generator with leads in the right atrium and right ventricle. Aortic atherosclerosis. Three-vessel coronary artery calcifications. Normal size heart. No significant pericardial effusion/thickening. Mediastinum/Nodes: No suspicious thyroid nodule. No pathologically enlarged mediastinal, hilar or axillary lymph nodes noting limited evaluation of the hilar structures on noncontrast enhanced examination. The esophagus is grossly unremarkable. Lungs/Pleura: Decreased right pleural thickening for instance in the posterolateral pleural  surface measuring 8 mm in maximum thickness on image 59/4 previously 13 mm. No left pleural thickening. Similar right perihilar consolidation with air bronchograms which was not hypermetabolic on prior PET-CT. Stable scattered bilateral pulmonary nodules common none of these were hypermetabolic on prior PET-CT  but are below the resolution. No new suspicious pulmonary nodules or masses identified. For reference: -Unchanged size of an oval 6 mm right upper lobe pulmonary nodule on image 82/4. -unchanged size of a 4 mm irregular right lower lobe pulmonary nodule on image 73/4. -Stable irregular pulmonary nodule in the left lower lobe measuring 4 mm on image 90/4 Musculoskeletal: No aggressive lytic or blastic lesion of bone. CT ABDOMEN PELVIS FINDINGS Hepatobiliary: No suspicious hepatic lesion on noncontrast enhanced examination. Gallbladder is not identified and likely surgically absent. No biliary ductal dilation. Pancreas: No pancreatic ductal dilation or evidence of acute inflammation. Spleen: No splenomegaly or focal splenic lesion. Adrenals/Urinary Tract: Bilateral adrenal glands are within normal limits. Bilateral tiny renal lesions are technically too small to accurately characterize but statistically likely to reflect cysts. Gas in the urinary bladder. Stomach/Bowel: No radiopaque enteric contrast material was administered. Stomach is unremarkable for degree of distension. No pathologic dilation of small or large bowel. No evidence of acute bowel inflammation. Prior partial left hemicolectomy with sigmoid stump and left anterior abdominal wall colostomy. Small fat containing peristomal hernia. Colonic diverticulosis without findings of acute diverticulitis. Vascular/Lymphatic: Aortic atherosclerosis. Smooth IVC contours. No pathologically enlarged abdominal or pelvic lymph nodes. Reproductive: Gas in the vagina. Other: Trace pelvic free fluid. No discrete peritoneal or omental nodularity. Musculoskeletal: No  aggressive lytic or blastic lesion of bone. Diffuse demineralization of bone. IMPRESSION: 1. Decreased nodular right pleural thickening, compatible with treatment response. 2. Stable scattered bilateral pulmonary nodules, none of which were hypermetabolic on prior PET-CT but are below the resolution of that examination. No new suspicious pulmonary nodules or masses identified. 3. No evidence of new or progressive disease in the chest, abdomen or pelvis. 4. Gas in the urinary bladder and vagina, suggest correlation with history of recent instrumentation. If no recent instrumentation findings would be suggestive of fistula. 5. Colonic diverticulosis without findings of acute diverticulitis. 6.  Aortic Atherosclerosis (ICD10-I70.0). Electronically Signed   By: Maudry Mayhew M.D.   On: 08/19/2023 17:19     ASSESSMENT/PLAN:  This is a very pleasant 89 year old Caucasian female diagnosed with recurrent and metastatic non-small cell lung cancer, adenocarcinoma.  She was initially diagnosed in 2013.  She is status post right middle lobe resection.  She is found to have recurrent disease in 2024 with extensive pleural nodularity in the right hemithorax without any notable or pulmonary parenchymal involvement.  There is no evidence of extrathoracic metastatic disease.  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      PD-L1 expression was 55%. The patient started treatment with Tagrisso 80 mg p.o. daily on June 27, 2023.  He started developing decreased appetite and weakness for the last 2.5 weeks.  She has held Systems analyst for few days.  She 75% better.  However the patient was also treated with Cipro for presumed urinary tract infection.  The patient was seen with Dr. Arbutus Ped today.  Dr. Arbutus Ped personally and independently reviewed the scan and discussed results with the patient today.  The scan showed creased nodular right pleural thickening  compatible with treatment response and stable bilateral pulmonary nodules.   It is unclear if her symptoms for the last 2-1/2 weeks secondary to urinary tract infection which also can cause confusion, weakness, and fatigue from her Tagrisso.  She had previously tolerated Tagrisso well for the first 2 months.  Dr. Arbutus Ped recommends to seeing her dose of Tagrisso  to 40 mg p.o. daily.  Her digoxin level is 1.5.  We will reach out to her cardiologist to relay this to them.  Her CT scan showed some gas in the bladder and vagina which could be due to possible fistula.  [facility]  She also has recurrent urinary tract infections.  We were able to obtain a urine sample and culture today.   See her back for labs and follow-up in 1 month.    She will continue using hydrocortisone cream and clindamycin cream for her rash. Her rash is better at this time   She will continue using Imodium if needed for diarrhea.   She will continue to try and increase her protein intake in the diet.  The patient was advised to call immediately if she has any concerning symptoms in the interval. The patient voices understanding of current disease status and treatment options and is in agreement with the current care plan. All questions were answered. The patient knows to call the clinic with any problems, questions or concerns. We can certainly see the patient much sooner if necessary     Orders Placed This Encounter  Procedures   Urine Culture    Standing Status:   Future    Expected Date:   09/15/2023    Expiration Date:   09/14/2024   Urinalysis, Complete w Microscopic    Standing Status:   Future    Expected Date:   09/15/2023    Expiration Date:   09/14/2024   CBC with Differential (Cancer Center Only)    Standing Status:   Future    Expiration Date:   09/14/2024   CMP (Cancer Center only)    Standing Status:   Future    Expiration Date:    09/14/2024   CBC with Differential (Cancer Center Only)    Standing Status:   Future    Expected Date:   10/13/2023    Expiration Date:   09/14/2024   CMP (Cancer Center only)    Standing Status:   Future    Expected Date:   10/13/2023    Expiration Date:   09/14/2024   Ambulatory referral to Urology    Referral Priority:   Routine    Referral Type:   Consultation    Referral Reason:   Specialty Services Required    Requested Specialty:   Urology    Number of Visits Requested:   1      Paizley Ramella L Julann Mcgilvray, PA-C 09/15/23  ADDENDUM: Hematology/Oncology Attending: I had a face-to-face encounter with the patient today.  I reviewed her records, lab, scan and recommended her care plan.  This is a very pleasant 89 years old white female with recurrent/metastatic non-small cell lung cancer, adenocarcinoma that was initially diagnosed in 2013 status post right middle lobe resection but the patient had evidence for disease recurrence and metastasis with extensive pleural nodularity of the right hemothorax in October 2024.  Molecular studies showed positive EGFR mutation with combined L858R and T790M.  The patient started treatment with Tagrisso 80 mg p.o. daily and she has been tolerating it well except for fatigue and her treatment has been on hold recently.  She has been dealing with recurrent urinary tract infection treated with her primary care provider. The patient had repeat CT scan of the chest, abdomen and pelvis performed recently.  I personally and independently reviewed the scan and discussed the result with  the patient and her daughter.  Her scan showed significant improvement of her disease after treatment with Tagrisso for the last 2 months. I recommended for the patient to resume her treatment with Tagrisso but we will reduce her dose to 40 mg p.o. daily because of her age and intolerability. For the recurrent urinary tract infection I recommended for her to see urology for evaluation and  will check her urinalysis today. The patient will come back for follow-up visit in 1 months for evaluation and repeat blood work. She was advised to call immediately if she has any other concerning symptoms in the interval. The total time spent in the appointment was 30 minutes. Disclaimer: This note was dictated with voice recognition software. Similar sounding words can inadvertently be transcribed and may be missed upon review. Lajuana Matte, MD

## 2023-09-15 ENCOUNTER — Inpatient Hospital Stay: Payer: Medicare Other | Attending: Hematology and Oncology

## 2023-09-15 ENCOUNTER — Other Ambulatory Visit (HOSPITAL_COMMUNITY): Payer: Self-pay

## 2023-09-15 ENCOUNTER — Telehealth: Payer: Self-pay | Admitting: Pharmacist

## 2023-09-15 ENCOUNTER — Inpatient Hospital Stay (HOSPITAL_BASED_OUTPATIENT_CLINIC_OR_DEPARTMENT_OTHER): Payer: Medicare Other | Admitting: Physician Assistant

## 2023-09-15 VITALS — BP 118/64 | HR 100 | Temp 97.8°F | Resp 16 | Wt 210.6 lb

## 2023-09-15 DIAGNOSIS — Z85048 Personal history of other malignant neoplasm of rectum, rectosigmoid junction, and anus: Secondary | ICD-10-CM | POA: Insufficient documentation

## 2023-09-15 DIAGNOSIS — R29898 Other symptoms and signs involving the musculoskeletal system: Secondary | ICD-10-CM | POA: Insufficient documentation

## 2023-09-15 DIAGNOSIS — R21 Rash and other nonspecific skin eruption: Secondary | ICD-10-CM | POA: Insufficient documentation

## 2023-09-15 DIAGNOSIS — Z880 Allergy status to penicillin: Secondary | ICD-10-CM | POA: Insufficient documentation

## 2023-09-15 DIAGNOSIS — R8290 Unspecified abnormal findings in urine: Secondary | ICD-10-CM | POA: Diagnosis not present

## 2023-09-15 DIAGNOSIS — Z87440 Personal history of urinary (tract) infections: Secondary | ICD-10-CM | POA: Insufficient documentation

## 2023-09-15 DIAGNOSIS — C3400 Malignant neoplasm of unspecified main bronchus: Secondary | ICD-10-CM

## 2023-09-15 DIAGNOSIS — E785 Hyperlipidemia, unspecified: Secondary | ICD-10-CM | POA: Insufficient documentation

## 2023-09-15 DIAGNOSIS — Z90710 Acquired absence of both cervix and uterus: Secondary | ICD-10-CM | POA: Insufficient documentation

## 2023-09-15 DIAGNOSIS — Z886 Allergy status to analgesic agent status: Secondary | ICD-10-CM | POA: Diagnosis not present

## 2023-09-15 DIAGNOSIS — I700 Atherosclerosis of aorta: Secondary | ICD-10-CM | POA: Diagnosis not present

## 2023-09-15 DIAGNOSIS — R634 Abnormal weight loss: Secondary | ICD-10-CM | POA: Insufficient documentation

## 2023-09-15 DIAGNOSIS — Z9049 Acquired absence of other specified parts of digestive tract: Secondary | ICD-10-CM | POA: Insufficient documentation

## 2023-09-15 DIAGNOSIS — C342 Malignant neoplasm of middle lobe, bronchus or lung: Secondary | ICD-10-CM | POA: Insufficient documentation

## 2023-09-15 DIAGNOSIS — N390 Urinary tract infection, site not specified: Secondary | ICD-10-CM | POA: Diagnosis not present

## 2023-09-15 DIAGNOSIS — R82998 Other abnormal findings in urine: Secondary | ICD-10-CM | POA: Diagnosis not present

## 2023-09-15 DIAGNOSIS — I4891 Unspecified atrial fibrillation: Secondary | ICD-10-CM | POA: Insufficient documentation

## 2023-09-15 DIAGNOSIS — Z79899 Other long term (current) drug therapy: Secondary | ICD-10-CM | POA: Diagnosis not present

## 2023-09-15 DIAGNOSIS — I480 Paroxysmal atrial fibrillation: Secondary | ICD-10-CM

## 2023-09-15 DIAGNOSIS — K5730 Diverticulosis of large intestine without perforation or abscess without bleeding: Secondary | ICD-10-CM | POA: Diagnosis not present

## 2023-09-15 DIAGNOSIS — R531 Weakness: Secondary | ICD-10-CM | POA: Insufficient documentation

## 2023-09-15 DIAGNOSIS — Z881 Allergy status to other antibiotic agents status: Secondary | ICD-10-CM | POA: Insufficient documentation

## 2023-09-15 DIAGNOSIS — R5383 Other fatigue: Secondary | ICD-10-CM | POA: Diagnosis not present

## 2023-09-15 DIAGNOSIS — R0609 Other forms of dyspnea: Secondary | ICD-10-CM | POA: Insufficient documentation

## 2023-09-15 DIAGNOSIS — Z882 Allergy status to sulfonamides status: Secondary | ICD-10-CM | POA: Diagnosis not present

## 2023-09-15 DIAGNOSIS — Z923 Personal history of irradiation: Secondary | ICD-10-CM | POA: Insufficient documentation

## 2023-09-15 DIAGNOSIS — Z86711 Personal history of pulmonary embolism: Secondary | ICD-10-CM | POA: Diagnosis not present

## 2023-09-15 DIAGNOSIS — Z885 Allergy status to narcotic agent status: Secondary | ICD-10-CM | POA: Diagnosis not present

## 2023-09-15 DIAGNOSIS — R32 Unspecified urinary incontinence: Secondary | ICD-10-CM | POA: Diagnosis not present

## 2023-09-15 DIAGNOSIS — Z888 Allergy status to other drugs, medicaments and biological substances status: Secondary | ICD-10-CM | POA: Insufficient documentation

## 2023-09-15 DIAGNOSIS — Z933 Colostomy status: Secondary | ICD-10-CM | POA: Insufficient documentation

## 2023-09-15 LAB — CBC WITH DIFFERENTIAL (CANCER CENTER ONLY)
Abs Immature Granulocytes: 0.04 10*3/uL (ref 0.00–0.07)
Basophils Absolute: 0 10*3/uL (ref 0.0–0.1)
Basophils Relative: 0 %
Eosinophils Absolute: 0.1 10*3/uL (ref 0.0–0.5)
Eosinophils Relative: 1 %
HCT: 36.5 % (ref 36.0–46.0)
Hemoglobin: 11.2 g/dL — ABNORMAL LOW (ref 12.0–15.0)
Immature Granulocytes: 1 %
Lymphocytes Relative: 9 %
Lymphs Abs: 0.7 10*3/uL (ref 0.7–4.0)
MCH: 25.3 pg — ABNORMAL LOW (ref 26.0–34.0)
MCHC: 30.7 g/dL (ref 30.0–36.0)
MCV: 82.6 fL (ref 80.0–100.0)
Monocytes Absolute: 0.5 10*3/uL (ref 0.1–1.0)
Monocytes Relative: 7 %
Neutro Abs: 6.6 10*3/uL (ref 1.7–7.7)
Neutrophils Relative %: 82 %
Platelet Count: 114 10*3/uL — ABNORMAL LOW (ref 150–400)
RBC: 4.42 MIL/uL (ref 3.87–5.11)
RDW: 16.4 % — ABNORMAL HIGH (ref 11.5–15.5)
WBC Count: 8 10*3/uL (ref 4.0–10.5)
nRBC: 0 % (ref 0.0–0.2)

## 2023-09-15 LAB — URINALYSIS, COMPLETE (UACMP) WITH MICROSCOPIC
Bilirubin Urine: NEGATIVE
Glucose, UA: NEGATIVE mg/dL
Hgb urine dipstick: NEGATIVE
Ketones, ur: NEGATIVE mg/dL
Nitrite: NEGATIVE
Protein, ur: NEGATIVE mg/dL
Specific Gravity, Urine: 1.018 (ref 1.005–1.030)
pH: 5 (ref 5.0–8.0)

## 2023-09-15 LAB — CMP (CANCER CENTER ONLY)
ALT: 8 U/L (ref 0–44)
AST: 15 U/L (ref 15–41)
Albumin: 3.9 g/dL (ref 3.5–5.0)
Alkaline Phosphatase: 84 U/L (ref 38–126)
Anion gap: 7 (ref 5–15)
BUN: 39 mg/dL — ABNORMAL HIGH (ref 8–23)
CO2: 36 mmol/L — ABNORMAL HIGH (ref 22–32)
Calcium: 9.5 mg/dL (ref 8.9–10.3)
Chloride: 96 mmol/L — ABNORMAL LOW (ref 98–111)
Creatinine: 1.32 mg/dL — ABNORMAL HIGH (ref 0.44–1.00)
GFR, Estimated: 38 mL/min — ABNORMAL LOW (ref >60)
Glucose, Bld: 108 mg/dL — ABNORMAL HIGH (ref 70–99)
Potassium: 3.4 mmol/L — ABNORMAL LOW (ref 3.5–5.1)
Sodium: 139 mmol/L (ref 135–145)
Total Bilirubin: 0.4 mg/dL (ref 0.0–1.2)
Total Protein: 6.6 g/dL (ref 6.5–8.1)

## 2023-09-15 LAB — DIGOXIN LEVEL: Digoxin Level: 1.5 ng/mL (ref 0.8–2.0)

## 2023-09-15 MED ORDER — OSIMERTINIB MESYLATE 40 MG PO TABS
40.0000 mg | ORAL_TABLET | Freq: Every day | ORAL | 2 refills | Status: DC
  Filled 2023-09-16 (×2): qty 30, 30d supply, fill #0
  Filled 2023-10-14: qty 30, 30d supply, fill #1
  Filled 2023-11-10: qty 30, 30d supply, fill #2

## 2023-09-15 NOTE — Telephone Encounter (Addendum)
Digoxin level back at 1.5. Increase since last draw in November 2024. Patient Tricia Hernandez being reduced to 40mg  today. Since digoxin level trending up, will decrease dose to digoxin 0.125mg  daily, no dose on Sunday, no dose on Wednesday, 1/2 tablet on Fridays. Total weekly dose 0.5625mg . Called and spoke with [name] (daughter)

## 2023-09-16 ENCOUNTER — Telehealth: Payer: Self-pay | Admitting: *Deleted

## 2023-09-16 ENCOUNTER — Other Ambulatory Visit (HOSPITAL_COMMUNITY): Payer: Self-pay

## 2023-09-16 ENCOUNTER — Other Ambulatory Visit: Payer: Self-pay

## 2023-09-16 ENCOUNTER — Other Ambulatory Visit (HOSPITAL_COMMUNITY): Payer: Self-pay | Admitting: Pharmacy Technician

## 2023-09-16 NOTE — Progress Notes (Signed)
 Specialty Pharmacy Refill Coordination Note  Tricia Hernandez is a 89 y.o. female contacted today regarding refills of specialty medication(s) Osimertinib  Mesylate (TAGRISSO )  Spoke with Daughter  Patient requested Delivery   Delivery date: 09/19/23   Verified address: Patient address [address]  [place]   Medication will be filled on 02/05 or 02/06.

## 2023-09-16 NOTE — Telephone Encounter (Signed)
-----   Message from Cassandra L Heilingoetter sent at 09/16/2023  8:55 AM EST ----- Can you call them and let them know we are waiting for the urine culture. Unclear if she continues to have UTI vs checking too soon since recently completing her antibiotics. ----- Message ----- From: Interface, [facility] [place] Sent: 09/15/2023   2:17 PM EST To: Cassandra L Heilingoetter, PA-C

## 2023-09-16 NOTE — Telephone Encounter (Signed)
Per Corbin Ade., P.A., called pt daughter with message below. Pt daughter verbalized understanding

## 2023-09-16 NOTE — Telephone Encounter (Signed)
Referral was faxed to [facility]. Receipt of fax was received

## 2023-09-17 ENCOUNTER — Other Ambulatory Visit: Payer: Self-pay | Admitting: Physician Assistant

## 2023-09-17 DIAGNOSIS — N390 Urinary tract infection, site not specified: Secondary | ICD-10-CM

## 2023-09-17 LAB — URINE CULTURE: Culture: >=100000 — AB

## 2023-09-17 MED ORDER — NITROFURANTOIN MONOHYD MACRO 100 MG PO CAPS: 100.0000 mg | ORAL_CAPSULE | Freq: Two times a day (BID) | ORAL | 0 refills | Status: AC

## 2023-10-07 ENCOUNTER — Other Ambulatory Visit: Payer: Self-pay

## 2023-10-10 NOTE — Progress Notes (Signed)
 [facility] OFFICE PROGRESS NOTE  Tricia Bass, FNP [address] [place] [address]  DIAGNOSIS: Recurrent and metastatic Non-small cell lung cancer, adenocarcinoma.  She was initially diagnosed in 2013 status post right middle lobe resection, she was found to have recurrent disease in 2024 with extensive pleural nodularity in the right hemithorax without any nodal or pulmonary parenchymal involvement.  There is no evidence of extrathoracic metastatic disease.   Biomarker Findings HRD signature - HRDsig Negative Microsatellite status - MS-Stable Tumor Mutational Burden - 4 Muts/Mb Genomic Findings For a complete list of the genes assayed, please refer to the Appendix. EGFR L858R, T790M PBRM1 E1025fs*49 RB1 R455* TP53 C141W 7 Disease relevant genes with no reportable alterations: ALK, BRAF, ERBB2, KRAS, MET, RET, ROS1 PD-L1 expression 55%  PRIOR THERAPY: Anal cancer treated with radiation and surgery in 2017   CURRENT THERAPY: Tagrisso 80 mg p.o. daily, first dose June 27, 2023. Status post 3.5 months. We reduced her dose of tagrisson on 09/15/23.   INTERVAL HISTORY: Tricia Hernandez 89 y.o. female returns to the clinic today for a follow-up visit accompanied by her daughter.  The patient is currently undergoing targeted treatment with Tagrisso.  She had been tolerating it well until in January she developed some weakness, fatigue, and decreased oral intake. Her tagrisso was on hold briefly.  However, the patient was also found to have a urinary tract infection around the same time and it was unclear if her symptoms were related to UTI, especially considering the abrupt onset versus her Tagrisso. However, we reduced the dose of Tagrisso to 40 mg p.o. daily. She completed her course of antibiotics. Due to frequent UTIs, she was referred to urology. She has an upcoming appointment but her daughter has to check her paperwork for the exact date.    She feels similar since last bing seen. She still does not have a great appetite but she does try to drink primer protein drinks daily. Her weight is stable/slightly increased by 2 lbs since last being seen. She is prescribed lasix PRN but her daughter has not been giving them to her as often due to no changes in her swelling.  She also reports some dyspnea on exertion. She has supplemental oxygen at home to wear PRN. She has been wearing her oxygen more frequently and may be slowly/progressively getting more winded with exertion. She denies any cough, chest pain, fevers, or sick contacts.  Denies any nausea or vomiting. She denies diarrhea or constipation. She has an intermittent rash on her nose from Tagrisso for which she uses cream. It is not uncommon for her to have intermittent headaches. They deny any changes in frequency or severity of the headaches. She is here today for evaluation and repeat blood work. Of note she is on digoxin by cardiology and they are monitoring her level. Her digoxin is pending from today.     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 5/2 EGD: grade A reflux esophagitis.  Mild, non-hemorrhagic gastritis.  Ablation of 3 Duodenal AVMs   Herpes zoster    History of blood transfusion    for vaginal, uterine bleeding leading to hysterectomy.  in 12/2016 transfused x 1 for GI bleed.    History of hiatal hernia    Hyperlipidemia    Hypertension    Labyrinthitis    Lung cancer (HCC)     RML   Obesity    On home oxygen therapy    "3L just at night when I sleep" (04/20/2018)   Paroxysmal supraventricular tachycardia (HCC)    Peptic ulcer of stomach    hx   Pneumonia 1980s X 1; 08/2016   "walking; double"   Presence of permanent cardiac pacemaker 04/20/2018   Pulmonary embolism (HCC) 05/2014   "left lung"   Seasonal allergies    "bad" (04/20/2018)   Vitamin D deficiency     ALLERGIES:  is allergic to penicillins; aleve [naproxen]; aleve [naproxen sodium]; antihistamines, chlorpheniramine-type; aspirin; benadryl [diphenhydramine hcl]; codeine; hydrochlorothiazide; rocephin [ceftriaxone sodium in dextrose]; and sulfa antibiotics.  MEDICATIONS:  Current Outpatient Medications  Medication Sig Dispense Refill   ALLERGY RELIEF 180 MG tablet TAKE 1 TABLET(180 MG) BY MOUTH DAILY 90 tablet 3   anti-nausea (EMETROL) solution Take 10 mLs by mouth every 15 (fifteen) minutes as needed for nausea or vomiting.     atorvastatin (LIPITOR) 20 MG tablet TAKE 1 TABLET(20 MG) BY MOUTH AT BEDTIME 90 tablet 3   digoxin (LANOXIN) 0.125 MG tablet Take 1 Tablet every day except for Wednesdays and Sundays and take 1/2 tablet on Fridays     diltiazem (CARDIZEM CD) 120 MG 24 hr capsule Take 1 capsule (120 mg total) by mouth daily. 90 capsule 2   ferrous sulfate 325 (65 FE) MG EC tablet Take 325 mg by mouth 2 (two) times daily.     fluticasone (FLONASE) 50 MCG/ACT nasal spray Place 1 spray into both nostrils daily.     Fluticasone-Umeclidin-Vilant (TRELEGY ELLIPTA) 200-62.5-25 MCG/ACT AEPB Inhale 1 puff into the lungs daily. 180 each 3   furosemide (LASIX) 20 MG tablet Take 1 tablet (20 mg total) by mouth daily. 90 tablet 3   ipratropium-albuterol (DUONEB) 0.5-2.5 (3) MG/3ML SOLN USE 1 VIAL VIA NEBULIZER TWICE DAILY 540 mL 11   levocetirizine (XYZAL) 5 MG tablet Take 1 tablet by mouth daily.     MAGnesium-Oxide 400 (240 Mg) MG tablet Take 1 tablet (400 mg total) by mouth daily. Please contact the  office to schedule appointment for additional refills. 90 tablet 1   nitrofurantoin, macrocrystal-monohydrate, (MACROBID) 100 MG capsule Take 1 capsule (100 mg total) by mouth 2 (two) times daily. 14 capsule 0   osimertinib mesylate (TAGRISSO) 40 MG tablet Take 1 tablet (40 mg total) by mouth daily. 30 tablet 2   PARoxetine (PAXIL) 20 MG tablet Take 1 tablet (20 mg total) by mouth at bedtime. 90 tablet 3   vitamin B-12 (CYANOCOBALAMIN) 1000 MCG tablet Take 1,000 mcg by mouth at bedtime.      Vitamin D, Ergocalciferol, (DRISDOL) 1.25 MG (50000 UNIT) CAPS capsule TAKE 1 CAPSULE BY MOUTH 1 TIME A WEEK 12 capsule 1   No current facility-administered medications for this visit.   Facility-Administered Medications Ordered in Other Visits  Medication Dose Route Frequency Provider Last Rate Last Admin   sodium phosphate (FLEET) 7-19 GM/118ML enema 2 enema  2 enema Rectal Once Andria Meuse, MD        SURGICAL HISTORY:  Past Surgical History:  Procedure Laterality Date   APPENDECTOMY  1959   BASAL CELL CARCINOMA EXCISION     nose x 2   BREAST CYST ASPIRATION Right 2016?   CATARACT EXTRACTION W/ INTRAOCULAR LENS  IMPLANT, BILATERAL Bilateral 1990s   CHOLECYSTECTOMY OPEN  1980s   COLON SURGERY     COLOSTOMY N/A 07/29/2016   Procedure: COLOSTOMY;  Surgeon: Berna Bue, MD;  Location: [facility] OR;  Service: General;  Laterality: N/A;   DILATION AND CURETTAGE OF UTERUS     ESOPHAGOGASTRODUODENOSCOPY N/A 12/11/2016   Procedure: ESOPHAGOGASTRODUODENOSCOPY (EGD);  Surgeon: [name] Fiedler, MD;  Location: [facility] ENDOSCOPY;  Service: Endoscopy;  Laterality: N/A;   EVALUATION UNDER ANESTHESIA WITH HEMORRHOIDECTOMY AND PROCTOSCOPY N/A 07/25/2016   Procedure: EXAM UNDER ANESTHESIA, EXCISION PERIANAL MASS.;  Surgeon: Jimmye Norman, MD;  Location: [facility] OR;  Service: General;  Laterality: N/A;  Prone position   FLEXIBLE SIGMOIDOSCOPY N/A 06/16/2017   Procedure: FLEXIBLE SIGMOIDOSCOPY EXAM UNDER ANESHESIA;  Surgeon:  Andria Meuse, MD;  Location: [facility];  Service: General;  Laterality: N/A;   INSERT / REPLACE / REMOVE PACEMAKER  04/20/2018   IR RADIOLOGIST EVAL & MGMT  03/25/2017   LAPAROSCOPIC DIVERTED COLOSTOMY N/A 07/29/2016   Procedure: ATTEMPTED LAPAROSCOPIC ASSISTED COLOSTOMY;  Surgeon: Berna Bue, MD;  Location: [facility] OR;  Service: General;  Laterality: N/A;   LUNG REMOVAL, PARTIAL Right 2013   RML mass/carcinoid, Dr Latanya Maudlin   PACEMAKER IMPLANT N/A 04/20/2018   Procedure: PACEMAKER IMPLANT;  Surgeon: Marinus Maw, MD;  Location: [facility] INVASIVE CV LAB;  Service: Cardiovascular;  Laterality: N/A;   SQUAMOUS CELL CARCINOMA EXCISION     rectum, right leg   TONSILLECTOMY AND ADENOIDECTOMY  1960s   VAGINAL HYSTERECTOMY  1959   "partial"    REVIEW OF SYSTEMS:   Review of Systems  Constitutional: Positive for fatigue and diminished appetite. Negative for chills and fever.  HENT: Negative for mouth sores, nosebleeds, sore throat and trouble swallowing.   Eyes: Negative for eye problems and icterus.  Respiratory: Positive for dyspnea on exertion. Negative for cough, hemoptysis,  and wheezing.   Cardiovascular: Negative for chest pain. Stable bilateral lower extremity swelling.  Gastrointestinal: Negative for abdominal pain, diarrhea, constipation, nausea and vomiting.  Genitourinary: Negative for difficulty urinating, dysuria, frequency and hematuria.   Musculoskeletal: Negative for back pain, gait problem, neck pain and neck stiffness.  Skin: Positive for intermittent rash on face.  Neurological: Positive for generalized weakness. Negative for dizziness, extremity weakness, gait problem, light-headedness and seizures.  Hematological: Negative for adenopathy. Does not bruise/bleed easily.  Psychiatric/Behavioral: Negative for confusion, depression and sleep disturbance. The patient is not nervous/anxious.     PHYSICAL EXAMINATION:  Blood pressure 130/65, pulse 93, temperature 97.6 F  (36.4 C), temperature source Temporal, resp. rate 17, weight 212 lb 1.6 oz (96.2 kg), SpO2 95%.  ECOG PERFORMANCE STATUS: 2-3  Physical Exam  Constitutional: Oriented to person, place, and time and elderly appearing female and in no distress.  HENT:  Head: Normocephalic and atraumatic. Hard of hearing.  Mouth/Throat: Oropharynx is clear and moist. No oropharyngeal exudate.  Eyes: Conjunctivae are normal. Right eye exhibits no discharge. Left eye exhibits no discharge. No scleral icterus.  Neck: Normal range of motion. Neck supple.  Cardiovascular: Normal rate, regular rhythm, normal heart sounds and intact distal pulses.   Pulmonary/Chest: Effort normal and breath sounds  normal. No respiratory distress. No wheezes. No rales.  Abdominal: Soft. Bowel sounds are normal. Exhibits no distension and no mass. There is no tenderness.  Musculoskeletal: Normal range of motion. Stable bilateral edema in the lower extremities bilaterally.  Lymphadenopathy:    No cervical adenopathy.  Neurological: Alert and oriented to person, place, and time. Exhibits muscle wasting. She was examined in the wheelchair.  Skin: Skin is warm and dry. No rash noted. Not diaphoretic. No erythema. No pallor.  Psychiatric: Mood, memory and judgment normal.  Vitals reviewed.  LABORATORY DATA: Lab Results  Component Value Date   WBC 6.1 10/15/2023   HGB 10.1 (L) 10/15/2023   HCT 33.8 (L) 10/15/2023   MCV 85.6 10/15/2023   PLT 93 (L) 10/15/2023      Chemistry      Component Value Date/Time   NA 140 10/15/2023 1008   NA 142 05/07/2022 1010   NA 144 12/30/2016 0850   K 4.0 10/15/2023 1008   K 4.1 12/30/2016 0850   CL 100 10/15/2023 1008   CO2 33 (H) 10/15/2023 1008   CO2 33 (H) 12/30/2016 0850   BUN 21 10/15/2023 1008   BUN 18 05/07/2022 1010   BUN 27.8 (H) 12/30/2016 0850   CREATININE 1.08 (H) 10/15/2023 1008   CREATININE 1.0 12/30/2016 0850      Component Value Date/Time   CALCIUM 8.9 10/15/2023 1008    CALCIUM 9.7 12/30/2016 0850   ALKPHOS 86 10/15/2023 1008   ALKPHOS 46 12/30/2016 0850   AST 12 (L) 10/15/2023 1008   AST 17 12/30/2016 0850   ALT 5 10/15/2023 1008   ALT 10 12/30/2016 0850   BILITOT 0.5 10/15/2023 1008   BILITOT 0.32 12/30/2016 0850       RADIOGRAPHIC STUDIES:  No results found.   ASSESSMENT/PLAN:  This is a very pleasant 89 year old Caucasian female diagnosed with recurrent and metastatic non-small cell lung cancer, adenocarcinoma.  She was initially diagnosed in 2013.  She is status post right middle lobe resection.  She is found to have recurrent disease in 2024 with extensive pleural nodularity in the right hemithorax without any notable or pulmonary parenchymal involvement.  There is no evidence of extrathoracic metastatic disease.  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    PD-L1 expression was 55%. The patient started treatment with Tagrisso 80 mg p.o. daily on June 27, 2023.  She started developing decreased appetite and weakness for the last 2.5 weeks around the time she was being treated for UTI.  She has held Systems analyst for few days. She resumed this at a reduced dose of 40 mg p.o. daily  I reviewed her symptoms with Dr. Arbutus Ped. Labs were reviewed. Recommend she continue on the reduced dose of tagrisso 40 mg p.o. daily and I will arrange for a restaging CT scan of the chest, abdomen, and pelvis. I will order wo contrast due to fluctuating kidney function. Her creatinine is improved today.   Her digoxin level is pending.  We will reach out to her cardiologist to relay this to them once available.    She will establish  with urology regarding recurrent UTIs  She was advised to monitor her weight at home and oxygen. For worsening swelling, she has lasix PRN prescribed by cardiology. She has supplemental oxygen PRN. Of note, they have compression stockings at home but the patient does not  like to wear them due to pain.  See her back for labs and follow-up in 1 month or sooner if new or worsening symptoms.   She will continue using hydrocortisone cream and clindamycin cream for her rash. Her rash is better at this time   She will continue using Imodium if needed for diarrhea.   She will continue to try and increase her protein intake in the diet.  The patient was advised to call immediately if she has any concerning symptoms in the interval. The patient voices understanding of current disease status and treatment options and is in agreement with the current care plan. All questions were answered. The patient knows to call the clinic with any problems, questions or concerns. We can certainly see the patient much sooner if necessary    Orders Placed This Encounter  Procedures   CT CHEST ABDOMEN PELVIS WO CONTRAST    Standing Status:   Future    Expected Date:   11/03/2023    Expiration Date:   10/14/2024    Preferred imaging location?:   [facility]    If indicated for the ordered procedure, I authorize the administration of oral contrast media per Radiology protocol:   No    Reason for no oral contrast::   Intermittent CKD    Release to patient:   Immediate   CBC with Differential (Cancer Center Only)    Standing Status:   Future    Expected Date:   10/29/2023    Expiration Date:   10/14/2024   CMP (Cancer Center only)    Standing Status:   Future    Expected Date:   10/29/2023    Expiration Date:   10/14/2024   Digoxin level    Standing Status:   Future    Expected Date:   11/15/2023    Expiration Date:   10/14/2024     The total time spent in the appointment was 20-29 minutes  Tricia Melchor L Jaanvi Fizer, PA-C 10/15/23

## 2023-10-14 ENCOUNTER — Other Ambulatory Visit: Payer: Self-pay

## 2023-10-14 ENCOUNTER — Other Ambulatory Visit: Payer: Self-pay | Admitting: Pharmacy Technician

## 2023-10-14 NOTE — Progress Notes (Signed)
 Specialty Pharmacy Ongoing Clinical Assessment Note  Tricia Hernandez is a 89 y.o. female who is being followed by the specialty pharmacy service for RxSp Oncology   Patient's specialty medication(s) reviewed today: Osimertinib Mesylate (TAGRISSO)   Missed doses in the last 4 weeks: 0   Patient/Caregiver did not have any additional questions or concerns.   Therapeutic benefit summary: Unable to assess   Adverse events/side effects summary: No adverse events/side effects   Patient's therapy is appropriate to: Continue    Goals Addressed             This Visit's Progress    Stabilization of disease       Patient is on track. Patient will maintain adherence         Follow up:  3 months  Bobette Mo Specialty Pharmacist

## 2023-10-14 NOTE — Progress Notes (Signed)
 Specialty Pharmacy Refill Coordination Note  Tricia Hernandez is a 89 y.o. female contacted today regarding refills of specialty medication(s) Osimertinib Mesylate [name])   Patient requested Delivery   Delivery date: 10/28/23   Verified address: [address]  [place]   Medication will be filled on 10/27/23.

## 2023-10-15 ENCOUNTER — Inpatient Hospital Stay (HOSPITAL_BASED_OUTPATIENT_CLINIC_OR_DEPARTMENT_OTHER): Payer: Medicare Other | Admitting: Physician Assistant

## 2023-10-15 ENCOUNTER — Other Ambulatory Visit: Payer: Self-pay

## 2023-10-15 ENCOUNTER — Inpatient Hospital Stay: Payer: Medicare Other | Attending: Hematology and Oncology

## 2023-10-15 VITALS — BP 130/65 | HR 93 | Temp 97.6°F | Resp 17 | Wt 212.1 lb

## 2023-10-15 DIAGNOSIS — C3481 Malignant neoplasm of overlapping sites of right bronchus and lung: Secondary | ICD-10-CM | POA: Diagnosis not present

## 2023-10-15 DIAGNOSIS — I4891 Unspecified atrial fibrillation: Secondary | ICD-10-CM | POA: Diagnosis not present

## 2023-10-15 DIAGNOSIS — Z923 Personal history of irradiation: Secondary | ICD-10-CM | POA: Insufficient documentation

## 2023-10-15 DIAGNOSIS — R0609 Other forms of dyspnea: Secondary | ICD-10-CM | POA: Insufficient documentation

## 2023-10-15 DIAGNOSIS — R531 Weakness: Secondary | ICD-10-CM | POA: Diagnosis not present

## 2023-10-15 DIAGNOSIS — Z79899 Other long term (current) drug therapy: Secondary | ICD-10-CM | POA: Diagnosis not present

## 2023-10-15 DIAGNOSIS — Z9981 Dependence on supplemental oxygen: Secondary | ICD-10-CM | POA: Insufficient documentation

## 2023-10-15 DIAGNOSIS — Z87440 Personal history of urinary (tract) infections: Secondary | ICD-10-CM | POA: Diagnosis not present

## 2023-10-15 DIAGNOSIS — Z885 Allergy status to narcotic agent status: Secondary | ICD-10-CM | POA: Diagnosis not present

## 2023-10-15 DIAGNOSIS — C342 Malignant neoplasm of middle lobe, bronchus or lung: Secondary | ICD-10-CM | POA: Diagnosis present

## 2023-10-15 DIAGNOSIS — Z881 Allergy status to other antibiotic agents status: Secondary | ICD-10-CM | POA: Insufficient documentation

## 2023-10-15 DIAGNOSIS — Z888 Allergy status to other drugs, medicaments and biological substances status: Secondary | ICD-10-CM | POA: Insufficient documentation

## 2023-10-15 DIAGNOSIS — I480 Paroxysmal atrial fibrillation: Secondary | ICD-10-CM

## 2023-10-15 DIAGNOSIS — E785 Hyperlipidemia, unspecified: Secondary | ICD-10-CM | POA: Diagnosis not present

## 2023-10-15 DIAGNOSIS — Z882 Allergy status to sulfonamides status: Secondary | ICD-10-CM | POA: Diagnosis not present

## 2023-10-15 DIAGNOSIS — Z880 Allergy status to penicillin: Secondary | ICD-10-CM | POA: Diagnosis not present

## 2023-10-15 DIAGNOSIS — C3400 Malignant neoplasm of unspecified main bronchus: Secondary | ICD-10-CM

## 2023-10-15 DIAGNOSIS — N390 Urinary tract infection, site not specified: Secondary | ICD-10-CM | POA: Insufficient documentation

## 2023-10-15 DIAGNOSIS — R5383 Other fatigue: Secondary | ICD-10-CM | POA: Diagnosis not present

## 2023-10-15 DIAGNOSIS — Z86711 Personal history of pulmonary embolism: Secondary | ICD-10-CM | POA: Diagnosis not present

## 2023-10-15 DIAGNOSIS — Z9049 Acquired absence of other specified parts of digestive tract: Secondary | ICD-10-CM | POA: Insufficient documentation

## 2023-10-15 DIAGNOSIS — R197 Diarrhea, unspecified: Secondary | ICD-10-CM | POA: Diagnosis not present

## 2023-10-15 DIAGNOSIS — R21 Rash and other nonspecific skin eruption: Secondary | ICD-10-CM | POA: Insufficient documentation

## 2023-10-15 DIAGNOSIS — Z886 Allergy status to analgesic agent status: Secondary | ICD-10-CM | POA: Insufficient documentation

## 2023-10-15 DIAGNOSIS — Z90710 Acquired absence of both cervix and uterus: Secondary | ICD-10-CM | POA: Insufficient documentation

## 2023-10-15 LAB — CBC WITH DIFFERENTIAL (CANCER CENTER ONLY)
Abs Immature Granulocytes: 0.01 10*3/uL (ref 0.00–0.07)
Basophils Absolute: 0 10*3/uL (ref 0.0–0.1)
Basophils Relative: 0 %
Eosinophils Absolute: 0.2 10*3/uL (ref 0.0–0.5)
Eosinophils Relative: 3 %
HCT: 33.8 % — ABNORMAL LOW (ref 36.0–46.0)
Hemoglobin: 10.1 g/dL — ABNORMAL LOW (ref 12.0–15.0)
Immature Granulocytes: 0 %
Lymphocytes Relative: 14 %
Lymphs Abs: 0.9 10*3/uL (ref 0.7–4.0)
MCH: 25.6 pg — ABNORMAL LOW (ref 26.0–34.0)
MCHC: 29.9 g/dL — ABNORMAL LOW (ref 30.0–36.0)
MCV: 85.6 fL (ref 80.0–100.0)
Monocytes Absolute: 0.3 10*3/uL (ref 0.1–1.0)
Monocytes Relative: 5 %
Neutro Abs: 4.8 10*3/uL (ref 1.7–7.7)
Neutrophils Relative %: 78 %
Platelet Count: 93 10*3/uL — ABNORMAL LOW (ref 150–400)
RBC: 3.95 MIL/uL (ref 3.87–5.11)
RDW: 16.8 % — ABNORMAL HIGH (ref 11.5–15.5)
WBC Count: 6.1 10*3/uL (ref 4.0–10.5)
nRBC: 0 % (ref 0.0–0.2)

## 2023-10-15 LAB — CMP (CANCER CENTER ONLY)
ALT: 5 U/L (ref 0–44)
AST: 12 U/L — ABNORMAL LOW (ref 15–41)
Albumin: 3.9 g/dL (ref 3.5–5.0)
Alkaline Phosphatase: 86 U/L (ref 38–126)
Anion gap: 7 (ref 5–15)
BUN: 21 mg/dL (ref 8–23)
CO2: 33 mmol/L — ABNORMAL HIGH (ref 22–32)
Calcium: 8.9 mg/dL (ref 8.9–10.3)
Chloride: 100 mmol/L (ref 98–111)
Creatinine: 1.08 mg/dL — ABNORMAL HIGH (ref 0.44–1.00)
GFR, Estimated: 49 mL/min — ABNORMAL LOW (ref >60)
Glucose, Bld: 96 mg/dL (ref 70–99)
Potassium: 4 mmol/L (ref 3.5–5.1)
Sodium: 140 mmol/L (ref 135–145)
Total Bilirubin: 0.5 mg/dL (ref 0.0–1.2)
Total Protein: 6.7 g/dL (ref 6.5–8.1)

## 2023-10-15 LAB — DIGOXIN LEVEL: Digoxin Level: 0.7 ng/mL — ABNORMAL LOW (ref 0.8–2.0)

## 2023-10-17 ENCOUNTER — Telehealth: Payer: Self-pay

## 2023-10-17 NOTE — Telephone Encounter (Signed)
 Pt daughter called in stating that she received a msg that we have not gotten her mothers transmission and she will send a manual this evening and we can check Monday to make sure that we have gotten in. If not we will call her and let her know it did not send

## 2023-10-21 ENCOUNTER — Telehealth: Payer: Self-pay | Admitting: Pharmacist

## 2023-10-21 NOTE — Telephone Encounter (Signed)
 Patient's dig level slightly low after dose reduction last month (Tagrisso dose also reduced). Will go back to previous dose of 0.125mg  tablets daily, no dose on Sundays or Wednesdays. Daughter voiced understanding.

## 2023-10-21 NOTE — Telephone Encounter (Signed)
 Spoke with patient's daughter, she asked if we had received transmission yet. Advised I did not see it in chart

## 2023-10-27 ENCOUNTER — Other Ambulatory Visit: Payer: Self-pay

## 2023-11-03 ENCOUNTER — Ambulatory Visit (HOSPITAL_COMMUNITY)
Admission: RE | Admit: 2023-11-03 | Discharge: 2023-11-03 | Disposition: A | Discharge status: Home / Self Care | Source: Ambulatory Visit | Attending: Physician Assistant | Admitting: Physician Assistant

## 2023-11-03 DIAGNOSIS — C3481 Malignant neoplasm of overlapping sites of right bronchus and lung: Secondary | ICD-10-CM | POA: Diagnosis present

## 2023-11-04 ENCOUNTER — Other Ambulatory Visit: Payer: Self-pay | Admitting: Family

## 2023-11-04 DIAGNOSIS — E782 Mixed hyperlipidemia: Secondary | ICD-10-CM

## 2023-11-04 MED ORDER — ATORVASTATIN CALCIUM 20 MG PO TABS: 20.0000 mg | ORAL_TABLET | Freq: Every day | ORAL | 0 refills | Status: DC

## 2023-11-04 NOTE — Telephone Encounter (Signed)
 Copied from CRM [id number]. Topic: Clinical - Medication Refill >> Nov 04, 2023 10:59 AM Josefa Half C wrote: Most Recent Primary Care Visit:  Provider: Gwenlyn Fudge  Department: [facility]  Visit Type: ACUTE  Date: 09/01/2023  Medication: atorvastatin (LIPITOR) 20 MG tablet   Has the patient contacted their pharmacy? Yes, the patients pharmacy said they sent in the refill request but did not receive a response  (Agent: If no, request that the patient contact the pharmacy for the refill. If patient does not wish to contact the pharmacy document the reason why and proceed with request.) (Agent: If yes, when and what did the pharmacy advise?)  Is this the correct pharmacy for this prescription? Yes If no, delete pharmacy and type the correct one.  This is the patient's preferred pharmacy:   [facility], [place] - [address] [place] [address] Phone: [phone] Fax: [phone]   Has the prescription been filled recently? No  Is the patient out of the medication? No, patient has 3-4 pills left  Has the patient been seen for an appointment in the last year OR does the patient have an upcoming appointment? Yes  Can we respond through MyChart? Yes  Agent: Please be advised that Rx refills may take up to 3 business days. We ask that you follow-up with your pharmacy.

## 2023-11-07 ENCOUNTER — Other Ambulatory Visit: Payer: Self-pay | Admitting: Pulmonary Disease

## 2023-11-10 ENCOUNTER — Other Ambulatory Visit: Payer: Self-pay

## 2023-11-10 ENCOUNTER — Other Ambulatory Visit (HOSPITAL_COMMUNITY): Payer: Self-pay

## 2023-11-10 NOTE — Progress Notes (Signed)
 Clinical Intervention Note  Clinical Intervention Notes: Patient initiated treatment with Macrobid. No DDIs identified with Tagrisso.   Clinical Intervention Outcomes: Prevention of an adverse drug event   Otto Herb Specialty Pharmacist

## 2023-11-10 NOTE — Progress Notes (Signed)
 Specialty Pharmacy Refill Coordination Note  Tricia Hernandez is a 89 y.o. female, patients daughter [name] was contacted today regarding refills of specialty medication(s) Osimertinib Mesylate [name])   Patient requested Delivery   Delivery date: 11/24/23   Verified address: [address]   [place] [address]   Medication will be filled on 11/21/23.

## 2023-11-12 ENCOUNTER — Inpatient Hospital Stay: Attending: Hematology and Oncology

## 2023-11-12 ENCOUNTER — Encounter: Payer: Self-pay | Admitting: Pharmacist

## 2023-11-12 ENCOUNTER — Inpatient Hospital Stay (HOSPITAL_BASED_OUTPATIENT_CLINIC_OR_DEPARTMENT_OTHER): Admitting: Internal Medicine

## 2023-11-12 VITALS — BP 140/53 | HR 86 | Temp 97.3°F | Resp 17 | Ht 64.0 in | Wt 205.0 lb

## 2023-11-12 DIAGNOSIS — E785 Hyperlipidemia, unspecified: Secondary | ICD-10-CM | POA: Diagnosis not present

## 2023-11-12 DIAGNOSIS — Z90710 Acquired absence of both cervix and uterus: Secondary | ICD-10-CM | POA: Insufficient documentation

## 2023-11-12 DIAGNOSIS — Z882 Allergy status to sulfonamides status: Secondary | ICD-10-CM | POA: Diagnosis not present

## 2023-11-12 DIAGNOSIS — Z880 Allergy status to penicillin: Secondary | ICD-10-CM | POA: Diagnosis not present

## 2023-11-12 DIAGNOSIS — Z881 Allergy status to other antibiotic agents status: Secondary | ICD-10-CM | POA: Insufficient documentation

## 2023-11-12 DIAGNOSIS — Z85048 Personal history of other malignant neoplasm of rectum, rectosigmoid junction, and anus: Secondary | ICD-10-CM | POA: Diagnosis not present

## 2023-11-12 DIAGNOSIS — Z9049 Acquired absence of other specified parts of digestive tract: Secondary | ICD-10-CM | POA: Insufficient documentation

## 2023-11-12 DIAGNOSIS — R197 Diarrhea, unspecified: Secondary | ICD-10-CM | POA: Insufficient documentation

## 2023-11-12 DIAGNOSIS — Z87440 Personal history of urinary (tract) infections: Secondary | ICD-10-CM | POA: Diagnosis not present

## 2023-11-12 DIAGNOSIS — R0609 Other forms of dyspnea: Secondary | ICD-10-CM | POA: Diagnosis not present

## 2023-11-12 DIAGNOSIS — R0602 Shortness of breath: Secondary | ICD-10-CM | POA: Insufficient documentation

## 2023-11-12 DIAGNOSIS — Z885 Allergy status to narcotic agent status: Secondary | ICD-10-CM | POA: Diagnosis not present

## 2023-11-12 DIAGNOSIS — Z923 Personal history of irradiation: Secondary | ICD-10-CM | POA: Diagnosis not present

## 2023-11-12 DIAGNOSIS — Z886 Allergy status to analgesic agent status: Secondary | ICD-10-CM | POA: Diagnosis not present

## 2023-11-12 DIAGNOSIS — Z79899 Other long term (current) drug therapy: Secondary | ICD-10-CM | POA: Insufficient documentation

## 2023-11-12 DIAGNOSIS — C3481 Malignant neoplasm of overlapping sites of right bronchus and lung: Secondary | ICD-10-CM

## 2023-11-12 DIAGNOSIS — R5383 Other fatigue: Secondary | ICD-10-CM | POA: Diagnosis not present

## 2023-11-12 DIAGNOSIS — Z888 Allergy status to other drugs, medicaments and biological substances status: Secondary | ICD-10-CM | POA: Insufficient documentation

## 2023-11-12 DIAGNOSIS — I480 Paroxysmal atrial fibrillation: Secondary | ICD-10-CM

## 2023-11-12 DIAGNOSIS — R4189 Other symptoms and signs involving cognitive functions and awareness: Secondary | ICD-10-CM | POA: Diagnosis not present

## 2023-11-12 DIAGNOSIS — I10 Essential (primary) hypertension: Secondary | ICD-10-CM | POA: Insufficient documentation

## 2023-11-12 DIAGNOSIS — C342 Malignant neoplasm of middle lobe, bronchus or lung: Secondary | ICD-10-CM | POA: Diagnosis present

## 2023-11-12 DIAGNOSIS — M2550 Pain in unspecified joint: Secondary | ICD-10-CM | POA: Diagnosis not present

## 2023-11-12 DIAGNOSIS — C3411 Malignant neoplasm of upper lobe, right bronchus or lung: Secondary | ICD-10-CM | POA: Diagnosis not present

## 2023-11-12 DIAGNOSIS — I4891 Unspecified atrial fibrillation: Secondary | ICD-10-CM | POA: Insufficient documentation

## 2023-11-12 DIAGNOSIS — F419 Anxiety disorder, unspecified: Secondary | ICD-10-CM | POA: Insufficient documentation

## 2023-11-12 DIAGNOSIS — Z86711 Personal history of pulmonary embolism: Secondary | ICD-10-CM | POA: Diagnosis not present

## 2023-11-12 LAB — CBC WITH DIFFERENTIAL (CANCER CENTER ONLY)
Abs Immature Granulocytes: 0.02 10*3/uL (ref 0.00–0.07)
Basophils Absolute: 0 10*3/uL (ref 0.0–0.1)
Basophils Relative: 0 %
Eosinophils Absolute: 0.2 10*3/uL (ref 0.0–0.5)
Eosinophils Relative: 2 %
HCT: 35.2 % — ABNORMAL LOW (ref 36.0–46.0)
Hemoglobin: 10.5 g/dL — ABNORMAL LOW (ref 12.0–15.0)
Immature Granulocytes: 0 %
Lymphocytes Relative: 9 %
Lymphs Abs: 0.6 10*3/uL — ABNORMAL LOW (ref 0.7–4.0)
MCH: 25.2 pg — ABNORMAL LOW (ref 26.0–34.0)
MCHC: 29.8 g/dL — ABNORMAL LOW (ref 30.0–36.0)
MCV: 84.6 fL (ref 80.0–100.0)
Monocytes Absolute: 0.4 10*3/uL (ref 0.1–1.0)
Monocytes Relative: 5 %
Neutro Abs: 6.2 10*3/uL (ref 1.7–7.7)
Neutrophils Relative %: 84 %
Platelet Count: 101 10*3/uL — ABNORMAL LOW (ref 150–400)
RBC: 4.16 MIL/uL (ref 3.87–5.11)
RDW: 16.9 % — ABNORMAL HIGH (ref 11.5–15.5)
WBC Count: 7.4 10*3/uL (ref 4.0–10.5)
nRBC: 0 % (ref 0.0–0.2)

## 2023-11-12 LAB — CMP (CANCER CENTER ONLY)
ALT: 6 U/L (ref 0–44)
AST: 14 U/L — ABNORMAL LOW (ref 15–41)
Albumin: 4.1 g/dL (ref 3.5–5.0)
Alkaline Phosphatase: 90 U/L (ref 38–126)
Anion gap: 6 (ref 5–15)
BUN: 23 mg/dL (ref 8–23)
CO2: 34 mmol/L — ABNORMAL HIGH (ref 22–32)
Calcium: 9.3 mg/dL (ref 8.9–10.3)
Chloride: 101 mmol/L (ref 98–111)
Creatinine: 1.15 mg/dL — ABNORMAL HIGH (ref 0.44–1.00)
GFR, Estimated: 45 mL/min — ABNORMAL LOW (ref >60)
Glucose, Bld: 101 mg/dL — ABNORMAL HIGH (ref 70–99)
Potassium: 3.9 mmol/L (ref 3.5–5.1)
Sodium: 141 mmol/L (ref 135–145)
Total Bilirubin: 0.5 mg/dL (ref 0.0–1.2)
Total Protein: 6.9 g/dL (ref 6.5–8.1)

## 2023-11-12 LAB — DIGOXIN LEVEL: Digoxin Level: 1.1 ng/mL (ref 0.8–2.0)

## 2023-11-12 NOTE — Progress Notes (Signed)
 [facility] Telephone:(336) [phone]   Fax:(336) [phone]  OFFICE PROGRESS NOTE  Tricia Bass, FNP [address] [place] [address]  DIAGNOSIS: Recurrent and metastatic Non-small cell lung cancer, adenocarcinoma.  She was initially diagnosed in 2013 status post right middle lobe resection, she was found to have recurrent disease in 2024 with extensive pleural nodularity in the right hemithorax without any nodal or pulmonary parenchymal involvement.  There is no evidence of extrathoracic metastatic disease.   Biomarker Findings HRD signature - HRDsig Negative Microsatellite status - MS-Stable Tumor Mutational Burden - 4 Muts/Mb Genomic Findings For a complete list of the genes assayed, please refer to the Appendix. EGFR L858R, T790M PBRM1 [id number]*49 RB1 R455* TP53 C141W 7 Disease relevant genes with no reportable alterations: ALK, BRAF, ERBB2, KRAS, MET, RET, ROS1 PD-L1 expression 55% PRIOR THERAPY: anal cancer treated with radiation and surgery in 2017    CURRENT THERAPY: Tagrisso 80 mg p.o. daily, first dose June 27, 2023.  This was reduced to Tagrisso 40 mg p.o. daily in March 2025.  INTERVAL HISTORY: Tricia Hernandez 89 y.o. female returns to the clinic today for follow-up visit accompanied by her daughter.Discussed the use of AI scribe software for clinical note transcription with the patient, who gave verbal consent to proceed.  History of Present Illness   Tricia Hernandez is a 89 year old female with metastatic non-small cell lung cancer who presents for evaluation and repeat imaging studies for restaging of her disease. She is accompanied by her daughter.  She has metastatic non-small cell lung cancer, adenocarcinoma, diagnosed in October 2024, with EGFR mutations L858R and T790M. She started treatment with Tagrisso 80 mg on June 27, 2023, and is currently taking 40 mg once a day due to better tolerance. Repeat imaging  studies are planned for restaging.  She feels very winded and experiences shortness of breath upon exertion. She uses supplemental oxygen at night and has recently started using it during the day. No chest pain.  She experiences intermittent diarrhea, which varies in consistency due to her colostomy bag. She continues to take Tagrisso and is handling the reduced dose better.  She has a history of recurrent urinary tract infections and was evaluated by a urologist who recommended surgery for a fistula, but surgery is not feasible due to her age. She is on a daily antibiotic to manage the infections.  Her daughter notes that she sometimes appears confused and is trying to celebrate many birthdays, indicating some cognitive changes.       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 5/2 EGD: grade A reflux esophagitis.  Mild, non-hemorrhagic gastritis.  Ablation of 3 Duodenal AVMs   Herpes zoster    History of blood transfusion    for vaginal, uterine bleeding leading to hysterectomy.  in 12/2016 transfused x 1 for  GI bleed.    History of hiatal hernia    Hyperlipidemia    Hypertension    Labyrinthitis    Lung cancer (HCC)    RML   Obesity    On home oxygen therapy    "3L just at night when I sleep" (04/20/2018)   Paroxysmal supraventricular tachycardia (HCC)    Peptic ulcer of stomach    hx   Pneumonia 1980s X 1; 08/2016   "walking; double"   Presence of permanent cardiac pacemaker 04/20/2018    Pulmonary embolism (HCC) 05/2014   "left lung"   Seasonal allergies    "bad" (04/20/2018)   Vitamin D deficiency     ALLERGIES:  is allergic to penicillins; aleve [naproxen]; aleve [naproxen sodium]; antihistamines, chlorpheniramine-type; aspirin; benadryl [diphenhydramine hcl]; codeine; hydrochlorothiazide; rocephin [ceftriaxone sodium in dextrose]; and sulfa antibiotics.  MEDICATIONS:  Current Outpatient Medications  Medication Sig Dispense Refill   ALLERGY RELIEF 180 MG tablet TAKE 1 TABLET(180 MG) BY MOUTH DAILY 90 tablet 3   anti-nausea (EMETROL) solution Take 10 mLs by mouth every 15 (fifteen) minutes as needed for nausea or vomiting.     atorvastatin (LIPITOR) 20 MG tablet Take 1 tablet (20 mg total) by mouth at bedtime. 90 tablet 0   digoxin (LANOXIN) 0.125 MG tablet Take 1 Tablet every day except for Wednesdays and Sundays     diltiazem (CARDIZEM CD) 120 MG 24 hr capsule Take 1 capsule (120 mg total) by mouth daily. 90 capsule 2   ferrous sulfate 325 (65 FE) MG EC tablet Take 325 mg by mouth 2 (two) times daily.     fluticasone (FLONASE) 50 MCG/ACT nasal spray Place 1 spray into both nostrils daily.     Fluticasone-Umeclidin-Vilant (TRELEGY ELLIPTA) 200-62.5-25 MCG/ACT AEPB Inhale 1 puff into the lungs daily. 180 each 3   furosemide (LASIX) 20 MG tablet Take 1 tablet (20 mg total) by mouth daily. 90 tablet 3   ipratropium-albuterol (DUONEB) 0.5-2.5 (3) MG/3ML SOLN USE 1 VIAL VIA NEBULIZER TWICE DAILY 540 mL 11   levocetirizine (XYZAL) 5 MG tablet Take 1 tablet by mouth daily.     MAGnesium-Oxide 400 (240 Mg) MG tablet Take 1 tablet (400 mg total) by mouth daily. Please contact the office to schedule appointment for additional refills. 90 tablet 1   nitrofurantoin, macrocrystal-monohydrate, (MACROBID) 100 MG capsule Take 1 capsule (100 mg total) by mouth 2 (two) times daily. 14 capsule 0   osimertinib mesylate (TAGRISSO) 40 MG tablet Take 1 tablet (40 mg total) by mouth daily. 30  tablet 2   PARoxetine (PAXIL) 20 MG tablet Take 1 tablet (20 mg total) by mouth at bedtime. 90 tablet 3   vitamin B-12 (CYANOCOBALAMIN) 1000 MCG tablet Take 1,000 mcg by mouth at bedtime.      Vitamin D, Ergocalciferol, (DRISDOL) 1.25 MG (50000 UNIT) CAPS capsule TAKE 1 CAPSULE BY MOUTH 1 TIME A WEEK 12 capsule 1   No current facility-administered medications for this visit.   Facility-Administered Medications Ordered in Other Visits  Medication Dose Route Frequency Provider Last Rate Last Admin   sodium phosphate (FLEET) 7-19 GM/118ML enema 2 enema  2 enema Rectal Once Andria Meuse, MD        SURGICAL HISTORY:  Past Surgical History:  Procedure Laterality Date   APPENDECTOMY  1959   BASAL CELL CARCINOMA EXCISION     nose x 2   BREAST CYST ASPIRATION Right 2016?   CATARACT EXTRACTION W/ INTRAOCULAR LENS  IMPLANT, BILATERAL Bilateral 1990s  CHOLECYSTECTOMY OPEN  1980s   COLON SURGERY     COLOSTOMY N/A 07/29/2016   Procedure: COLOSTOMY;  Surgeon: Berna Bue, MD;  Location: [facility] OR;  Service: General;  Laterality: N/A;   DILATION AND CURETTAGE OF UTERUS     ESOPHAGOGASTRODUODENOSCOPY N/A 12/11/2016   Procedure: ESOPHAGOGASTRODUODENOSCOPY (EGD);  Surgeon: [name] Fiedler, MD;  Location: [facility] ENDOSCOPY;  Service: Endoscopy;  Laterality: N/A;   EVALUATION UNDER ANESTHESIA WITH HEMORRHOIDECTOMY AND PROCTOSCOPY N/A 07/25/2016   Procedure: EXAM UNDER ANESTHESIA, EXCISION PERIANAL MASS.;  Surgeon: Jimmye Norman, MD;  Location: [facility] OR;  Service: General;  Laterality: N/A;  Prone position   FLEXIBLE SIGMOIDOSCOPY N/A 06/16/2017   Procedure: FLEXIBLE SIGMOIDOSCOPY EXAM UNDER ANESHESIA;  Surgeon: Andria Meuse, MD;  Location: [facility] ENDOSCOPY;  Service: General;  Laterality: N/A;   INSERT / REPLACE / REMOVE PACEMAKER  04/20/2018   IR RADIOLOGIST EVAL & MGMT  03/25/2017   LAPAROSCOPIC DIVERTED COLOSTOMY N/A 07/29/2016   Procedure: ATTEMPTED LAPAROSCOPIC ASSISTED COLOSTOMY;  Surgeon: Berna Bue, MD;  Location: [facility] OR;  Service: General;  Laterality: N/A;   LUNG REMOVAL, PARTIAL Right 2013   RML mass/carcinoid, Dr Latanya Maudlin   PACEMAKER IMPLANT N/A 04/20/2018   Procedure: PACEMAKER IMPLANT;  Surgeon: Marinus Maw, MD;  Location: [facility] INVASIVE CV LAB;  Service: Cardiovascular;  Laterality: N/A;   SQUAMOUS CELL CARCINOMA EXCISION     rectum, right leg   TONSILLECTOMY AND ADENOIDECTOMY  1960s   VAGINAL HYSTERECTOMY  1959   "partial"    REVIEW OF SYSTEMS:  Constitutional: positive for fatigue Eyes: negative Ears, nose, mouth, throat, and face: negative Respiratory: positive for dyspnea on exertion Cardiovascular: negative Gastrointestinal: negative Genitourinary:negative Integument/breast: negative Hematologic/lymphatic: negative Musculoskeletal:positive for arthralgias Neurological: negative Behavioral/Psych: negative Endocrine: negative Allergic/Immunologic: negative   PHYSICAL EXAMINATION: General appearance: alert, cooperative, fatigued, and no distress Head: Normocephalic, without obvious abnormality, atraumatic Neck: no adenopathy, no JVD, supple, symmetrical, trachea midline, and thyroid not enlarged, symmetric, no tenderness/mass/nodules Lymph nodes: Cervical, supraclavicular, and axillary nodes normal. Resp: clear to auscultation bilaterally Back: symmetric, no curvature. ROM normal. No CVA tenderness. Cardio: regular rate and rhythm, S1, S2 normal, no murmur, click, rub or gallop GI: soft, non-tender; bowel sounds normal; no masses,  no organomegaly Extremities: extremities normal, atraumatic, no cyanosis or edema Neurologic: Alert and oriented X 3, normal strength and tone. Normal symmetric reflexes. Normal coordination and gait  ECOG PERFORMANCE STATUS: 1 - Symptomatic but completely ambulatory  Blood pressure (!) 140/53, pulse 86, temperature (!) 97.3 F (36.3 C), temperature source Temporal, resp. rate 17, height 5\' 4"  (1.626 m), weight 205 lb (93 kg),  SpO2 93%.  LABORATORY DATA: Lab Results  Component Value Date   WBC 7.4 11/12/2023   HGB 10.5 (L) 11/12/2023   HCT 35.2 (L) 11/12/2023   MCV 84.6 11/12/2023   PLT 101 (L) 11/12/2023      Chemistry      Component Value Date/Time   NA 140 10/15/2023 1008   NA 142 05/07/2022 1010   NA 144 12/30/2016 0850   K 4.0 10/15/2023 1008   K 4.1 12/30/2016 0850   CL 100 10/15/2023 1008   CO2 33 (H) 10/15/2023 1008   CO2 33 (H) 12/30/2016 0850   BUN 21 10/15/2023 1008   BUN 18 05/07/2022 1010   BUN 27.8 (H) 12/30/2016 0850   CREATININE 1.08 (H) 10/15/2023 1008   CREATININE 1.0 12/30/2016 0850      Component Value Date/Time   CALCIUM 8.9 10/15/2023 1008  CALCIUM 9.7 12/30/2016 0850   ALKPHOS 86 10/15/2023 1008   ALKPHOS 46 12/30/2016 0850   AST 12 (L) 10/15/2023 1008   AST 17 12/30/2016 0850   ALT 5 10/15/2023 1008   ALT 10 12/30/2016 0850   BILITOT 0.5 10/15/2023 1008   BILITOT 0.32 12/30/2016 0850       RADIOGRAPHIC STUDIES: No results found.  ASSESSMENT AND PLAN: This is a very pleasant 89 years old white female diagnosed with Recurrent and metastatic Non-small cell lung cancer, adenocarcinoma.  She was initially diagnosed in 2013 status post right middle lobe resection, she was found to have recurrent disease in 2024 with extensive pleural nodularity in the right hemithorax without any nodal or pulmonary parenchymal involvement.  There is no evidence of extrathoracic metastatic disease. Molecular studies showed positive EGFR mutation in exon 21 with L858R as well as T790M mutation.  PD-L1 expression was 55%. The patient started treatment with Tagrisso 80 mg p.o. daily on June 27, 2023.  She is currently on Tagrisso 40 mg p.o. daily since March 2025 secondary to intolerance of the higher dose. She is tolerating her treatment fairly well. She had repeat CT scan of the chest, abdomen and pelvis performed recently.  I personally and independently reviewed the scan images and  discussed the result with the patient and her daughter.  The final report of the scan is still pending.  I do not see clear evidence for disease progression but I will wait for the final report for confirmation.    Metastatic non-small cell lung cancer (adenocarcinoma) with EGFR mutation Tricia Hernandez has metastatic non-small cell lung cancer with adenocarcinoma, diagnosed in October 2024. The tumor is positive for EGFR mutation L858R and T790M. She started treatment with Tagrisso (osimertinib) 80 mg on June 27, 2023, and is currently on a reduced dose of 40 mg once daily due to better tolerance. She reports dyspnea and requires daytime oxygen, with an oxygen saturation of 93% on room air. No chest pain reported. Imaging studies for restaging are pending, and she is clinically stable. The decision to continue Tagrisso at the current dose is based on her tolerance and symptom management. Imaging results will guide further management. - Continue Tagrisso 40 mg once daily - Order repeat imaging studies for restaging - Evaluate imaging results and contact her if there are any concerns  Diarrhea Intermittent diarrhea, possibly related to cancer treatment or colostomy. Stool consistency varies due to the colostomy bag.  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 Surgery is not an option due to her age. She is on a daily antibiotic regimen as prescribed by her urologist. - Continue daily antibiotic as prescribed by urologist   The patient was advised to call immediately if she has any concerning symptoms in the interval. The patient voices understanding of current disease status and treatment options and is in agreement with the current care plan.  All questions were answered. The patient knows to call the clinic with any problems, questions or concerns. We can certainly see the patient much sooner if necessary.  The total time spent in the appointment was 30  minutes.  Disclaimer: This note was dictated with voice recognition software. Similar sounding words can inadvertently be transcribed and may not be corrected upon review.

## 2023-11-13 ENCOUNTER — Other Ambulatory Visit: Payer: Self-pay | Admitting: Physician Assistant

## 2023-11-13 DIAGNOSIS — I480 Paroxysmal atrial fibrillation: Secondary | ICD-10-CM

## 2023-11-18 ENCOUNTER — Ambulatory Visit (INDEPENDENT_AMBULATORY_CARE_PROVIDER_SITE_OTHER): Payer: Medicare Other | Admitting: Family

## 2023-11-18 ENCOUNTER — Encounter: Payer: Self-pay | Admitting: Family

## 2023-11-18 VITALS — BP 128/78 | HR 80 | Ht 64.0 in | Wt 205.0 lb

## 2023-11-18 DIAGNOSIS — I10 Essential (primary) hypertension: Secondary | ICD-10-CM

## 2023-11-18 DIAGNOSIS — C3400 Malignant neoplasm of unspecified main bronchus: Secondary | ICD-10-CM

## 2023-11-18 DIAGNOSIS — I480 Paroxysmal atrial fibrillation: Secondary | ICD-10-CM

## 2023-11-18 DIAGNOSIS — J42 Unspecified chronic bronchitis: Secondary | ICD-10-CM

## 2023-11-18 DIAGNOSIS — F419 Anxiety disorder, unspecified: Secondary | ICD-10-CM

## 2023-11-18 DIAGNOSIS — J439 Emphysema, unspecified: Secondary | ICD-10-CM

## 2023-11-18 DIAGNOSIS — F32A Depression, unspecified: Secondary | ICD-10-CM

## 2023-11-18 DIAGNOSIS — J4489 Other specified chronic obstructive pulmonary disease: Secondary | ICD-10-CM

## 2023-11-18 DIAGNOSIS — N390 Urinary tract infection, site not specified: Secondary | ICD-10-CM

## 2023-11-18 DIAGNOSIS — E782 Mixed hyperlipidemia: Secondary | ICD-10-CM | POA: Diagnosis not present

## 2023-11-18 MED ORDER — PAROXETINE HCL 20 MG PO TABS
20.0000 mg | ORAL_TABLET | Freq: Two times a day (BID) | ORAL | 3 refills | Status: DC
Start: 2023-11-18 — End: 2023-12-19

## 2023-11-18 MED ORDER — ATORVASTATIN CALCIUM 20 MG PO TABS: 20.0000 mg | ORAL_TABLET | Freq: Every day | ORAL | 3 refills | Status: AC

## 2023-11-18 MED ORDER — FEXOFENADINE HCL 180 MG PO TABS: 180.0000 mg | ORAL_TABLET | Freq: Every day | ORAL | 3 refills | Status: DC

## 2023-11-18 NOTE — Progress Notes (Signed)
 Tricia Hernandez is a 89 y.o. female with the following history as recorded in EpicCare:  Patient Active Problem List   Diagnosis Date Noted   COPD with chronic bronchitis and emphysema (HCC) 03/23/2019   Anxiety and depression 11/02/2018   Allergic rhinitis 11/02/2018   Pacemaker 04/20/2018   Sinus node dysfunction (HCC) 04/14/2018   Vitamin D deficiency 05/27/2017   Venous stasis dermatitis of both lower extremities 02/23/2017   Anemia 02/22/2017   Acid reflux 02/22/2017   Colostomy care (HCC) 12/20/2016   Angiodysplasia of duodenum    GI bleeding 12/10/2016   Mixed hyperlipidemia 11/05/2016   Chronic bronchitis (HCC) 11/04/2016   Persistent atrial fibrillation (HCC) 10/07/2016   Acute respiratory failure with hypoxia (HCC) 08/26/2016   Essential hypertension    Pulmonary nodules    Palliative care encounter    Anal cancer (HCC) 07/22/2016   Atrial fibrillation (HCC), CHA2DS2-VASc Score 5 07/22/2016   Pulmonary embolus (HCC)    Obesity    Lung cancer (HCC)     Current Outpatient Medications  Medication Sig Dispense Refill   anti-nausea (EMETROL) solution Take 10 mLs by mouth every 15 (fifteen) minutes as needed for nausea or vomiting.     digoxin (LANOXIN) 0.125 MG tablet Take 1 Tablet every day except for Wednesdays and Sundays     ferrous sulfate 325 (65 FE) MG EC tablet Take 325 mg by mouth 2 (two) times daily.     fluticasone (FLONASE) 50 MCG/ACT nasal spray Place 1 spray into both nostrils daily.     Fluticasone-Umeclidin-Vilant (TRELEGY ELLIPTA) 200-62.5-25 MCG/ACT AEPB Inhale 1 puff into the lungs daily. 180 each 3   furosemide (LASIX) 20 MG tablet Take 1 tablet (20 mg total) by mouth daily. 90 tablet 3   ipratropium-albuterol (DUONEB) 0.5-2.5 (3) MG/3ML SOLN USE 1 VIAL VIA NEBULIZER TWICE DAILY 540 mL 11   MAGnesium-Oxide 400 (240 Mg) MG tablet Take 1 tablet (400 mg total) by mouth daily. Please contact the office to schedule appointment for additional refills. 90  tablet 1   nitrofurantoin, macrocrystal-monohydrate, (MACROBID) 100 MG capsule Take 1 capsule (100 mg total) by mouth 2 (two) times daily. 14 capsule 0   osimertinib mesylate (TAGRISSO) 40 MG tablet Take 1 tablet (40 mg total) by mouth daily. 30 tablet 2   vitamin B-12 (CYANOCOBALAMIN) 1000 MCG tablet Take 1,000 mcg by mouth at bedtime.      Vitamin D, Ergocalciferol, (DRISDOL) 1.25 MG (50000 UNIT) CAPS capsule TAKE 1 CAPSULE BY MOUTH 1 TIME A WEEK 12 capsule 1   atorvastatin (LIPITOR) 20 MG tablet Take 1 tablet (20 mg total) by mouth at bedtime. 90 tablet 3   diltiazem (CARDIZEM CD) 120 MG 24 hr capsule Take 1 capsule (120 mg total) by mouth daily. 90 capsule 2   fexofenadine (ALLERGY RELIEF) 180 MG tablet Take 1 tablet (180 mg total) by mouth daily. 90 tablet 3   PARoxetine (PAXIL) 20 MG tablet Take 1 tablet (20 mg total) by mouth 2 (two) times daily. 180 tablet 3   No current facility-administered medications for this visit.   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 5/2 EGD: grade A reflux esophagitis.  Mild, non-hemorrhagic gastritis.  Ablation of 3 Duodenal AVMs   Herpes zoster    History of blood transfusion    for vaginal, uterine bleeding leading to hysterectomy.  in 12/2016 transfused x 1 for GI bleed.    History of hiatal hernia    Hyperlipidemia    Hypertension    Labyrinthitis    Lung cancer (HCC)    RML   Obesity    On home oxygen therapy    "3L just at night when I sleep" (04/20/2018)   Paroxysmal supraventricular tachycardia (HCC)    Peptic ulcer of stomach    hx   Pneumonia 1980s X 1; 08/2016   "walking; double"   Presence of permanent cardiac pacemaker 04/20/2018   Pulmonary embolism (HCC) 05/2014   "left lung"   Seasonal allergies    "bad" (04/20/2018)   Vitamin D deficiency     Past Surgical History:  Procedure Laterality Date   APPENDECTOMY  1959   BASAL CELL CARCINOMA EXCISION     nose x 2   BREAST CYST ASPIRATION Right 2016?   CATARACT EXTRACTION W/ INTRAOCULAR LENS  IMPLANT, BILATERAL Bilateral 1990s   CHOLECYSTECTOMY OPEN  1980s   COLON SURGERY     COLOSTOMY N/A 07/29/2016   Procedure: COLOSTOMY;  Surgeon: Berna Bue, MD;  Location: [facility] OR;  Service: General;  Laterality: N/A;   DILATION AND CURETTAGE OF UTERUS     ESOPHAGOGASTRODUODENOSCOPY N/A 12/11/2016   Procedure: ESOPHAGOGASTRODUODENOSCOPY (EGD);  Surgeon: [name] Fiedler, MD;  Location: [facility] ENDOSCOPY;  Service: Endoscopy;  Laterality: N/A;   EVALUATION UNDER ANESTHESIA WITH HEMORRHOIDECTOMY AND PROCTOSCOPY N/A 07/25/2016   Procedure: EXAM UNDER ANESTHESIA, EXCISION PERIANAL MASS.;  Surgeon: Jimmye Norman, MD;  Location: [facility] OR;  Service: General;  Laterality: N/A;  Prone position   FLEXIBLE SIGMOIDOSCOPY N/A 06/16/2017   Procedure: FLEXIBLE SIGMOIDOSCOPY EXAM UNDER ANESHESIA;  Surgeon: Andria Meuse, MD;  Location: [facility] ENDOSCOPY;  Service: General;  Laterality: N/A;   INSERT / REPLACE / REMOVE PACEMAKER  04/20/2018   IR RADIOLOGIST EVAL &  MGMT  03/25/2017   LAPAROSCOPIC DIVERTED COLOSTOMY N/A 07/29/2016   Procedure: ATTEMPTED LAPAROSCOPIC ASSISTED COLOSTOMY;  Surgeon: Berna Bue, MD;  Location: [facility] OR;  Service: General;  Laterality: N/A;   LUNG REMOVAL, PARTIAL Right 2013   RML mass/carcinoid, Dr Latanya Maudlin   PACEMAKER IMPLANT N/A 04/20/2018   Procedure: PACEMAKER IMPLANT;  Surgeon: Marinus Maw, MD;  [facility]: [facility] INVASIVE CV LAB;  Service: Cardiovascular;  Laterality: N/A;   SQUAMOUS CELL CARCINOMA EXCISION     rectum, right leg   TONSILLECTOMY AND ADENOIDECTOMY  1960s   VAGINAL HYSTERECTOMY  1959   "partial"    Family History  Problem Relation Age of Onset   Heart attack Mother        Dec 1987   CVA  Mother    Hypertension Mother    Multiple myeloma Mother    Breast cancer Sister    Skin cancer Sister    Prostate cancer Brother    Throat cancer Brother    Cervical cancer Daughter    Leukemia Daughter    Leukemia Son    Throat cancer Brother    Cancer Brother        soft palette   Heart Problems Brother        Pacemaker   Colon cancer Neg Hx    Pancreatic cancer Neg Hx     Social History   Tobacco Use   Smoking status: Former    Types: Cigarettes   Smokeless tobacco: Never   Tobacco comments:    04/20/2018 "smoked once in awhile in the 1960s; not more that 4 packs total in my whole life"  Substance Use Topics   Alcohol use: Never    Subjective:   Accompanied by daughter who provides majority of history; seen for yearly follow up;  Under care of Dr. Arbutus Ped for lung cancer- monthly for treatments;  Dr. Liliane Shi- [facility]; scheduled to see Dr. Isaiah Serge later this month;  Is doing well on Paxil daily;  Per daughter, in general doing okay/ appetite has been down but patient is still eating;     Objective:  Vitals:   11/18/23 1029  BP: 128/78  Pulse: 80  SpO2: 92%  Weight: 205 lb (93 kg)  Height: 5\' 4"  (1.626 m)    General: Well developed, well nourished, in no acute distress  Skin :  Warm and dry.  Head: Normocephalic and atraumatic  Eyes: Sclera and conjunctiva clear; pupils round and reactive to light; extraocular movements intact  Ears: External normal; canals clear; tympanic membranes normal  Oropharynx: Pink, supple. No suspicious lesions  Neck: Supple without thyromegaly, adenopathy  Lungs: Respirations unlabored; clear to auscultation bilaterally without wheeze, rales, rhonchi  CVS exam: normal rate and regular rhythm.  Neurologic: Alert and oriented; speech intact; face symmetrical; in wheelchair;  Assessment:  1. Essential hypertension   2. Mixed hyperlipidemia   3. Paroxysmal atrial fibrillation (HCC)   4. Chronic bronchitis, unspecified chronic bronchitis type (HCC)   5. Malignant neoplasm of hilus of lung, unspecified laterality (HCC)   6. COPD with chronic bronchitis and emphysema (HCC)   7. Chronic UTI   8. Anxiety and depression     Plan:  Refills updated as needed; continue with specialists who are managing majority of patient's care; plan to follow up here in 1 year, sooner prn.   Return in about 1 year (around 11/17/2024).  No orders of the defined types were placed in this encounter.   Requested Prescriptions   Signed Prescriptions Disp Refills   PARoxetine (PAXIL) 20 MG tablet 180 tablet 3    Sig: Take 1 tablet (20 mg total) by mouth 2 (two) times daily.   atorvastatin (LIPITOR) 20 MG tablet 90 tablet 3    Sig: Take 1 tablet (20 mg total) by mouth at bedtime.   fexofenadine (ALLERGY RELIEF) 180 MG tablet 90 tablet 3    Sig: Take 1 tablet (180 mg total) by mouth daily.

## 2023-11-20 ENCOUNTER — Other Ambulatory Visit (HOSPITAL_COMMUNITY): Payer: Self-pay

## 2023-11-21 ENCOUNTER — Other Ambulatory Visit: Payer: Self-pay

## 2023-12-03 ENCOUNTER — Encounter: Payer: Self-pay | Admitting: Pulmonary Disease

## 2023-12-03 ENCOUNTER — Ambulatory Visit: Admitting: Pulmonary Disease

## 2023-12-03 VITALS — BP 130/60 | HR 76 | Temp 99.3°F | Ht 64.0 in | Wt 205.8 lb

## 2023-12-03 DIAGNOSIS — C7801 Secondary malignant neoplasm of right lung: Secondary | ICD-10-CM | POA: Diagnosis not present

## 2023-12-03 DIAGNOSIS — Z85118 Personal history of other malignant neoplasm of bronchus and lung: Secondary | ICD-10-CM | POA: Diagnosis not present

## 2023-12-03 DIAGNOSIS — Z87891 Personal history of nicotine dependence: Secondary | ICD-10-CM

## 2023-12-03 DIAGNOSIS — Z85048 Personal history of other malignant neoplasm of rectum, rectosigmoid junction, and anus: Secondary | ICD-10-CM | POA: Diagnosis not present

## 2023-12-03 DIAGNOSIS — C3491 Malignant neoplasm of unspecified part of right bronchus or lung: Secondary | ICD-10-CM

## 2023-12-03 DIAGNOSIS — J449 Chronic obstructive pulmonary disease, unspecified: Secondary | ICD-10-CM | POA: Diagnosis not present

## 2023-12-03 DIAGNOSIS — J439 Emphysema, unspecified: Secondary | ICD-10-CM

## 2023-12-03 NOTE — Patient Instructions (Signed)
 VISIT SUMMARY:  Today, you came in for a follow-up visit to discuss your lung conditions, including metastatic adenocarcinoma and COPD. We reviewed your current treatments and discussed your symptoms and challenges.  YOUR PLAN:  -METASTATIC ADENOCARCINOMA OF LUNG: Metastatic adenocarcinoma is a type of cancer that has spread from its original site to the lungs. You are currently being treated with Tagrisso , which has helped shrink the tumors but is not a cure. You have experienced significant weight loss and shortness of breath, which may be due to low oxygen  levels or heart issues. We will continue with your current treatment and monitor your symptoms closely.  -CHRONIC OBSTRUCTIVE PULMONARY DISEASE (COPD): COPD is a chronic lung disease that makes it hard to breathe. You are using the Trelegy inhaler once daily, but you have difficulty with the inhaler technique. It is important to take deep breaths when using the inhaler to ensure the medication reaches your lungs. Your caregiver can help remind you to take deep breaths.  INSTRUCTIONS:  Please continue taking your medications as prescribed. Make sure to practice deep breathing techniques with your inhaler, and have your caregiver assist you if needed. If you experience any worsening of symptoms or new issues, please contact our office. We will continue to monitor your condition and adjust your treatment as necessary.

## 2023-12-03 NOTE — Progress Notes (Signed)
 Tricia Hernandez    [id number]    Feb 08, 1931  Primary Care Physician:Murray, Honora Lutes, FNP  Referring Physician: [name], FNP [address] [place],  [place] [address]  Chief complaint:   Follow up of  COPD Lung nodules. 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 Work up included CT scan of the chest and PET scan which shows non avid stable lung nodules. Has H/O uncontrolled atrial fibrillation and loaded with Tikosyn . Also has sick sinus rhythm s/p PPM insertion in 2019.    Her anticoagulation was held in 2019 due GI bleed after discussion with Dr. Willy Harvest, GI and Dr. Lee Public, Oncology. She has been evaluated by IR for IVC filter. This has been deferred as she did not have any DVT or evidence of hypercoagulability.  She has a remote smoking history of about 5 pack years and exposure to secondhand smoke. Quit smoking in 1975. 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 She has been exposed to a lot of dust and fibre inhalation during her work in [employer].   Interim history: Discussed the use of AI scribe software for clinical note transcription with the patient, who gave verbal consent to proceed.  History of Present Illness Tricia Hernandez is a 89 year old female with metastatic adenocarcinoma and COPD who presents for follow-up of her lung conditions.  She has a history of pulmonary carcinoid in 2013 and new diagnosis of metastatic adenocarcinoma in 2024. A CT scan and PET scan, followed by a biopsy on the right side, confirmed the diagnosis. She is currently being treated with  Tagrisso , which has helped shrink the tumors but is not curative. She has experienced significant weight loss of about 55 to 60 pounds. She experiences shortness of breath, particularly when walking.  She also has a history of COPD and was previously on nebulizers, which were switched to Trelegy due to insurance coverage issues. She finds it challenging to use the inhaler effectively, as she struggles to inhale deeply enough to ensure the medication reaches her lungs. Her caregiver assists her in remembering to take deep breaths with the inhaler. Trelegy is taken once daily.  In terms of her social history, she lives independently but receives daily assistance from her caregiver, who visits in the morning and evening to ensure she takes her medication and eats. She is able to sleep well, averaging about twelve hours per night. She has difficulty with mobility due to steps at her home, but her caregiver assists her with this.    Outpatient Encounter Medications as of 12/03/2023  Medication Sig   anti-nausea (EMETROL) solution Take 10 mLs by mouth every 15 (fifteen) minutes as needed for nausea or vomiting.   atorvastatin  (LIPITOR) 20 MG tablet Take 1 tablet (20 mg total) by mouth at bedtime.   digoxin  (LANOXIN ) 0.125 MG tablet Take 1 Tablet every day except for Wednesdays and Sundays   ferrous sulfate  325 (65 FE) MG EC tablet Take 325 mg by mouth 2 (two) times daily.   fexofenadine  (ALLERGY RELIEF) 180 MG tablet Take 1 tablet (180 mg total)  by mouth daily.   fluticasone  (FLONASE ) 50 MCG/ACT nasal spray Place 1 spray into both nostrils daily.   Fluticasone -Umeclidin-Vilant (TRELEGY ELLIPTA ) 200-62.5-25 MCG/ACT AEPB Inhale 1 puff into the lungs daily.   furosemide  (LASIX ) 20 MG tablet Take 1 tablet (20 mg total) by mouth daily.   ipratropium-albuterol  (DUONEB) 0.5-2.5 (3) MG/3ML SOLN USE 1 VIAL VIA NEBULIZER TWICE DAILY   MAGnesium -Oxide 400 (240 Mg) MG tablet Take 1 tablet (400 mg total) by mouth  daily. Please contact the office to schedule appointment for additional refills.   nitrofurantoin , macrocrystal-monohydrate, (MACROBID ) 100 MG capsule Take 1 capsule (100 mg total) by mouth 2 (two) times daily.   osimertinib  mesylate (TAGRISSO ) 40 MG tablet Take 1 tablet (40 mg total) by mouth daily.   PARoxetine  (PAXIL ) 20 MG tablet Take 1 tablet (20 mg total) by mouth 2 (two) times daily.   vitamin B-12 (CYANOCOBALAMIN ) 1000 MCG tablet Take 1,000 mcg by mouth at bedtime.    Vitamin D , Ergocalciferol , (DRISDOL ) 1.25 MG (50000 UNIT) CAPS capsule TAKE 1 CAPSULE BY MOUTH 1 TIME A WEEK   diltiazem  (CARDIZEM  CD) 120 MG 24 hr capsule Take 1 capsule (120 mg total) by mouth daily.   Facility-Administered Encounter Medications as of 12/03/2023  Medication   sodium phosphate  (FLEET) 7-19 GM/118ML enema 2 enema   Physical Exam: Blood pressure 130/60, pulse 76, temperature 99.3 F (37.4 C), temperature source Oral, height 5\' 4"  (1.626 m), weight 205 lb 12.8 oz (93.4 kg), SpO2 92%. Gen:      No acute distress HEENT:  EOMI, sclera anicteric Neck:     No masses; no thyromegaly Lungs:    Clear to auscultation bilaterally; normal respiratory effort CV:         Regular rate and rhythm; no murmurs Abd:      + bowel sounds; soft, non-tender; no palpable masses, no distension Ext:    No edema; adequate peripheral perfusion Skin:      Warm and dry; no rash Neuro: alert and oriented x 3 Psych: normal mood and affect   Data Reviewed: Imaging: CT chest 07/25/16-postsurgical changes in right middle lung, multiple subcentimeter pulmonary nodules. CTA chest 08/27/16-no pulmonary embolus, stable postsurgical changes, multiple subcentimeter pulmonary nodules. Consolidative changes in bilateral lungs, small bilateral pleural effusions. PET scan 09/16/16- resolution of consolidative changes, stable postsurgical changes, stable subcentimeter pulmonary nodules which are non-PET avid CT chest abdomen pelvis 12/30/2017- no  evidence of recurrent metastatic disease in the abdomen or pelvis.  New pulmonary nodules in the right lower lobe.  Previously visualized pulmonary nodules are stable. CT chest abdomen pelvis 02/08/2019- no evidence of recurrent cancer.  Lung nodules are stable. CT chest abdomen pelvis 02/01/2020- no evidence of recurrent cancer.  Lung nodules are stable. CT chest 04/16/2023-pleural thickening in the right hemithorax PET scan 05/12/2023-extensive pleural nodularity in the right hemithorax, PET positive I have reviewed all the images personally  PFTs 02/25/17 FVC 1.43 [58%), FEV1 0.82 (45%), F/F 57, TLC 293%, RV/TLC 188% Severe obstruction with bronchodilator response, hyperinflation  Labs: Alpha-1 antitrypsin 05/27/2017-141, PI MM Assessment & Plan Metastatic adenocarcinoma of lung New diagnosis of metastatic adenocarcinoma of the lung. Currently receiving palliative treatment with Tagrisso  (osimertinib ), which has reduced tumor size but is not curative. She has experienced significant weight loss of 55-60 pounds and dyspnea on exertion, potentially due to oxygen  desaturation or cardiac issues.   Chronic obstructive pulmonary disease (COPD) COPD managed with Trelegy inhaler. She has difficulty with inhaler technique, particularly achieving a deep inhalation. Insurance  does not cover nebulizers, necessitating the switch to Trelegy, which is advantageous as a once-daily inhaler. Lung examination is unremarkable. - Instructed on proper inhaler technique, emphasizing the need for a deep inhalation to ensure medication reaches the lungs.  H/O lung carcinoid s/p resection History of anal cancer Surveillance CTs per oncology  Plan/Recommendations: - Continue Trelegy  Follow-up in 1 year  Phyllis Breeze MD [facility] 12/03/2023, 2:20 PM  CC: [name],*

## 2023-12-15 ENCOUNTER — Other Ambulatory Visit: Payer: Self-pay

## 2023-12-15 ENCOUNTER — Emergency Department (HOSPITAL_COMMUNITY)

## 2023-12-15 ENCOUNTER — Encounter (HOSPITAL_COMMUNITY): Payer: Self-pay

## 2023-12-15 ENCOUNTER — Inpatient Hospital Stay (HOSPITAL_COMMUNITY)
Admission: EM | Admit: 2023-12-15 | Discharge: 2023-12-19 | DRG: 054 | Disposition: A | Discharge status: Skilled Nursing Facility | Attending: Internal Medicine | Admitting: Internal Medicine

## 2023-12-15 DIAGNOSIS — E66812 Obesity, class 2: Secondary | ICD-10-CM | POA: Diagnosis present

## 2023-12-15 DIAGNOSIS — Z87440 Personal history of urinary (tract) infections: Secondary | ICD-10-CM

## 2023-12-15 DIAGNOSIS — I509 Heart failure, unspecified: Secondary | ICD-10-CM | POA: Diagnosis present

## 2023-12-15 DIAGNOSIS — C3490 Malignant neoplasm of unspecified part of unspecified bronchus or lung: Secondary | ICD-10-CM | POA: Diagnosis present

## 2023-12-15 DIAGNOSIS — R4189 Other symptoms and signs involving cognitive functions and awareness: Secondary | ICD-10-CM | POA: Diagnosis present

## 2023-12-15 DIAGNOSIS — N1831 Chronic kidney disease, stage 3a: Secondary | ICD-10-CM | POA: Diagnosis present

## 2023-12-15 DIAGNOSIS — Z792 Long term (current) use of antibiotics: Secondary | ICD-10-CM | POA: Diagnosis not present

## 2023-12-15 DIAGNOSIS — T50915A Adverse effect of multiple unspecified drugs, medicaments and biological substances, initial encounter: Secondary | ICD-10-CM | POA: Diagnosis present

## 2023-12-15 DIAGNOSIS — R4589 Other symptoms and signs involving emotional state: Secondary | ICD-10-CM | POA: Diagnosis not present

## 2023-12-15 DIAGNOSIS — R410 Disorientation, unspecified: Secondary | ICD-10-CM | POA: Diagnosis present

## 2023-12-15 DIAGNOSIS — J4489 Other specified chronic obstructive pulmonary disease: Secondary | ICD-10-CM | POA: Diagnosis present

## 2023-12-15 DIAGNOSIS — N823 Fistula of vagina to large intestine: Secondary | ICD-10-CM | POA: Diagnosis present

## 2023-12-15 DIAGNOSIS — G928 Other toxic encephalopathy: Secondary | ICD-10-CM | POA: Diagnosis present

## 2023-12-15 DIAGNOSIS — Z902 Acquired absence of lung [part of]: Secondary | ICD-10-CM

## 2023-12-15 DIAGNOSIS — I4891 Unspecified atrial fibrillation: Secondary | ICD-10-CM | POA: Diagnosis present

## 2023-12-15 DIAGNOSIS — Z87891 Personal history of nicotine dependence: Secondary | ICD-10-CM

## 2023-12-15 DIAGNOSIS — Z85828 Personal history of other malignant neoplasm of skin: Secondary | ICD-10-CM

## 2023-12-15 DIAGNOSIS — Z515 Encounter for palliative care: Secondary | ICD-10-CM

## 2023-12-15 DIAGNOSIS — Z66 Do not resuscitate: Secondary | ICD-10-CM | POA: Diagnosis not present

## 2023-12-15 DIAGNOSIS — C7931 Secondary malignant neoplasm of brain: Secondary | ICD-10-CM | POA: Diagnosis present

## 2023-12-15 DIAGNOSIS — Z7189 Other specified counseling: Secondary | ICD-10-CM

## 2023-12-15 DIAGNOSIS — E669 Obesity, unspecified: Secondary | ICD-10-CM | POA: Diagnosis present

## 2023-12-15 DIAGNOSIS — E782 Mixed hyperlipidemia: Secondary | ICD-10-CM | POA: Diagnosis present

## 2023-12-15 DIAGNOSIS — Z86711 Personal history of pulmonary embolism: Secondary | ICD-10-CM

## 2023-12-15 DIAGNOSIS — R4182 Altered mental status, unspecified: Principal | ICD-10-CM

## 2023-12-15 DIAGNOSIS — I130 Hypertensive heart and chronic kidney disease with heart failure and stage 1 through stage 4 chronic kidney disease, or unspecified chronic kidney disease: Secondary | ICD-10-CM | POA: Diagnosis present

## 2023-12-15 DIAGNOSIS — Z933 Colostomy status: Secondary | ICD-10-CM

## 2023-12-15 DIAGNOSIS — C3491 Malignant neoplasm of unspecified part of right bronchus or lung: Secondary | ICD-10-CM | POA: Diagnosis not present

## 2023-12-15 DIAGNOSIS — Z950 Presence of cardiac pacemaker: Secondary | ICD-10-CM | POA: Diagnosis not present

## 2023-12-15 DIAGNOSIS — C210 Malignant neoplasm of anus, unspecified: Secondary | ICD-10-CM | POA: Diagnosis present

## 2023-12-15 DIAGNOSIS — Z85048 Personal history of other malignant neoplasm of rectum, rectosigmoid junction, and anus: Secondary | ICD-10-CM

## 2023-12-15 DIAGNOSIS — G9341 Metabolic encephalopathy: Secondary | ICD-10-CM | POA: Diagnosis not present

## 2023-12-15 DIAGNOSIS — J439 Emphysema, unspecified: Secondary | ICD-10-CM | POA: Diagnosis present

## 2023-12-15 DIAGNOSIS — I10 Essential (primary) hypertension: Secondary | ICD-10-CM | POA: Diagnosis present

## 2023-12-15 DIAGNOSIS — Z6835 Body mass index (BMI) 35.0-35.9, adult: Secondary | ICD-10-CM

## 2023-12-15 DIAGNOSIS — Z79899 Other long term (current) drug therapy: Secondary | ICD-10-CM

## 2023-12-15 LAB — URINALYSIS, ROUTINE W REFLEX MICROSCOPIC
Bilirubin Urine: NEGATIVE
Glucose, UA: NEGATIVE mg/dL
Hgb urine dipstick: NEGATIVE
Ketones, ur: 5 mg/dL — AB
Leukocytes,Ua: NEGATIVE
Nitrite: NEGATIVE
Protein, ur: NEGATIVE mg/dL
Specific Gravity, Urine: 1.006 (ref 1.005–1.030)
pH: 5 (ref 5.0–8.0)

## 2023-12-15 LAB — CBC WITH DIFFERENTIAL/PLATELET
Abs Immature Granulocytes: 0.02 10*3/uL (ref 0.00–0.07)
Basophils Absolute: 0 10*3/uL (ref 0.0–0.1)
Basophils Relative: 0 %
Eosinophils Absolute: 0.2 10*3/uL (ref 0.0–0.5)
Eosinophils Relative: 3 %
HCT: 33.4 % — ABNORMAL LOW (ref 36.0–46.0)
Hemoglobin: 9.8 g/dL — ABNORMAL LOW (ref 12.0–15.0)
Immature Granulocytes: 0 %
Lymphocytes Relative: 13 %
Lymphs Abs: 0.9 10*3/uL (ref 0.7–4.0)
MCH: 24.9 pg — ABNORMAL LOW (ref 26.0–34.0)
MCHC: 29.3 g/dL — ABNORMAL LOW (ref 30.0–36.0)
MCV: 85 fL (ref 80.0–100.0)
Monocytes Absolute: 0.5 10*3/uL (ref 0.1–1.0)
Monocytes Relative: 7 %
Neutro Abs: 5.1 10*3/uL (ref 1.7–7.7)
Neutrophils Relative %: 77 %
Platelets: 90 10*3/uL — ABNORMAL LOW (ref 150–400)
RBC: 3.93 MIL/uL (ref 3.87–5.11)
RDW: 16.9 % — ABNORMAL HIGH (ref 11.5–15.5)
WBC: 6.7 10*3/uL (ref 4.0–10.5)
nRBC: 0 % (ref 0.0–0.2)

## 2023-12-15 LAB — BASIC METABOLIC PANEL WITH GFR
Anion gap: 11 (ref 5–15)
BUN: 22 mg/dL (ref 8–23)
CO2: 27 mmol/L (ref 22–32)
Calcium: 8.7 mg/dL — ABNORMAL LOW (ref 8.9–10.3)
Chloride: 101 mmol/L (ref 98–111)
Creatinine, Ser: 1.07 mg/dL — ABNORMAL HIGH (ref 0.44–1.00)
GFR, Estimated: 49 mL/min — ABNORMAL LOW (ref >60)
Glucose, Bld: 97 mg/dL (ref 70–99)
Potassium: 4 mmol/L (ref 3.5–5.1)
Sodium: 139 mmol/L (ref 135–145)

## 2023-12-15 LAB — HEPATIC FUNCTION PANEL
ALT: 13 U/L (ref 0–44)
AST: 18 U/L (ref 15–41)
Albumin: 3.5 g/dL (ref 3.5–5.0)
Alkaline Phosphatase: 71 U/L (ref 38–126)
Bilirubin, Direct: <0.1 mg/dL (ref 0.0–0.2)
Total Bilirubin: 0.5 mg/dL (ref 0.0–1.2)
Total Protein: 6.3 g/dL — ABNORMAL LOW (ref 6.5–8.1)

## 2023-12-15 LAB — ETHANOL: Alcohol, Ethyl (B): <15 mg/dL (ref <15)

## 2023-12-15 LAB — LIPASE, BLOOD: Lipase: 82 U/L — ABNORMAL HIGH (ref 11–51)

## 2023-12-15 LAB — DIGOXIN LEVEL: Digoxin Level: 1.1 ng/mL (ref 0.8–2.0)

## 2023-12-15 MED ORDER — ONDANSETRON HCL 4 MG/2ML IJ SOLN: 4.0000 mg | Freq: Four times a day (QID) | INTRAMUSCULAR | Status: DC | PRN

## 2023-12-15 MED ORDER — SODIUM CHLORIDE 0.9 % IV BOLUS
500.0000 mL | Freq: Once | INTRAVENOUS | Status: AC
  Administered 2023-12-15: 500 mL via INTRAVENOUS

## 2023-12-15 MED ORDER — ONDANSETRON HCL 4 MG PO TABS: 4.0000 mg | ORAL_TABLET | Freq: Four times a day (QID) | ORAL | Status: DC | PRN

## 2023-12-15 MED ORDER — DEXTROSE IN LACTATED RINGERS 5 % IV SOLN: INTRAVENOUS | Status: AC

## 2023-12-15 NOTE — ED Triage Notes (Signed)
 Pt has had UTI for a month that is not responding to antibiotics. Daughter states pt has had delusions that are worse depending on the day, referred here for IV abx. Stage 4 lung cancer, receiving immunotherapy daily

## 2023-12-15 NOTE — H&P (Signed)
 History and Physical    Patient: Tricia Hernandez DOB: 02-09-1931 DOA: 12/15/2023 DOS: the patient was seen and examined on 12/15/2023 PCP: [name], FNP  Patient coming from: {Point_of_Origin:[address]}  Chief Complaint:  Chief Complaint  Patient presents with   Altered Mental Status   HPI: Tricia Hernandez is a 89 y.o. 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 5/2 EGD: grade A reflux esophagitis.  Mild, non-hemorrhagic gastritis.  Ablation of 3 Duodenal AVMs   Herpes zoster    History of blood transfusion    for vaginal, uterine bleeding leading to hysterectomy.  in 12/2016 transfused x 1 for GI bleed.    History of hiatal hernia    Hyperlipidemia    Hypertension    Labyrinthitis    Lung cancer (HCC)    RML   Obesity    On home oxygen  therapy    "3L just at night when I sleep" (04/20/2018)   Paroxysmal supraventricular tachycardia (HCC)    Peptic ulcer of stomach    hx   Pneumonia 1980s X 1; 08/2016   "walking; double"   Presence of permanent cardiac pacemaker 04/20/2018   Pulmonary embolism (HCC) 05/2014   "left lung"   Seasonal allergies    "bad" (04/20/2018)   Vitamin D  deficiency    Past Surgical History:  Procedure Laterality Date   APPENDECTOMY  1959   BASAL CELL CARCINOMA EXCISION     nose x 2   BREAST CYST ASPIRATION Right 2016?   CATARACT EXTRACTION W/ INTRAOCULAR LENS  IMPLANT,  BILATERAL Bilateral 1990s   CHOLECYSTECTOMY OPEN  1980s   COLON SURGERY     COLOSTOMY N/A 07/29/2016   Procedure: COLOSTOMY;  Surgeon: Adalberto Acton, MD;  Location: [facility] OR;  Service: General;  Laterality: N/A;   DILATION AND CURETTAGE OF UTERUS     ESOPHAGOGASTRODUODENOSCOPY N/A 12/11/2016   Procedure: ESOPHAGOGASTRODUODENOSCOPY (EGD);  Surgeon: Nannette Babe, MD;  Location[facility]COPY;  Service: Endoscopy;  Laterality: N/A;   EVALUATION UNDER ANESTHESIA WITH HEMORRHOIDECTOMY AND PROCTOSCOPY N/A 07/25/2016   Procedure: EXAM UNDER ANESTHESIA, EXCISION PERIANAL MASS.;  Surgeon: Jerryl Morin, MD;  Location: [facility] OR;  Service: General;  Laterality: N/A;  Prone position   FLEXIBLE SIGMOIDOSCOPY N/A 06/16/2017   Procedure: FLEXIBLE SIGMOIDOSCOPY EXAM UNDER ANESHESIA;  Surgeon: Melvenia Stabs, MD;  Location: [facility] ENDOSCOPY;  Service: General;  Laterality: N/A;   INSERT / REPLACE / REMOVE PACEMAKER  04/20/2018   IR RADIOLOGIST EVAL & MGMT  03/25/2017   LAPAROSCOPIC DIVERTED COLOSTOMY N/A 07/29/2016   Procedure: ATTEMPTED LAPAROSCOPIC ASSISTED COLOSTOMY;  Surgeon: Adalberto Acton, MD;  Location: [facility] OR;  Service: General;  Laterality: N/A;   LUNG REMOVAL, PARTIAL Right 2013   RML mass/carcinoid, Dr Alonna James   PACEMAKER IMPLANT N/A 04/20/2018   Procedure: PACEMAKER IMPLANT;  Surgeon:  Tammie Fall, MD;  Location: [facility] INVASIVE CV LAB;  Service: Cardiovascular;  Laterality: N/A;   SQUAMOUS CELL CARCINOMA EXCISION     rectum, right leg   TONSILLECTOMY AND ADENOIDECTOMY  1960s   VAGINAL HYSTERECTOMY  1959   "partial"   Social History:  reports that she has quit smoking. Her smoking use included cigarettes. She has never used smokeless tobacco. She reports that she does not drink alcohol and does not use drugs.  Allergies  Allergen Reactions   Penicillins Other (See Comments)    Reaction:  Unknown  Has patient had a PCN reaction causing immediate rash, facial/tongue/throat swelling, SOB or  lightheadedness with hypotension: Unsure Has patient had a PCN reaction causing severe rash involving mucus membranes or skin necrosis: Unsure Has patient had a PCN reaction that required hospitalization Unsure Has patient had a PCN reaction occurring within the last 10 years: Unsure If all of the above answers are "NO", then may proceed with Cephalosporin use.    Aleve [Naproxen]    Aleve [Naproxen Sodium] Hives   Antihistamines, Chlorpheniramine-Type Rash   Aspirin Rash   Benadryl [Diphenhydramine Hcl] Palpitations and Other (See Comments)    Increases heart rate   Codeine Rash   Hydrochlorothiazide Rash   Rocephin [Ceftriaxone Sodium In Dextrose ] Rash   Sulfa Antibiotics Rash    Family History  Problem Relation Age of Onset   Heart attack Mother        Dec 1987   CVA Mother    Hypertension Mother    Multiple myeloma Mother    Breast cancer Sister    Skin cancer Sister    Prostate cancer Brother    Throat cancer Brother    Cervical cancer Daughter    Leukemia Daughter    Leukemia Son    Throat cancer Brother    Cancer Brother        soft palette   Heart Problems Brother        Pacemaker   Colon cancer Neg Hx    Pancreatic cancer Neg Hx     Prior to Admission medications   Medication Sig Start Date End Date Taking? Authorizing Provider  anti-nausea (EMETROL) solution Take 10 mLs by mouth every 15 (fifteen) minutes as needed for nausea or vomiting.    [provider]  atorvastatin  (LIPITOR) 20 MG tablet Take 1 tablet (20 mg total) by mouth at bedtime. 11/18/23   [name], FNP  digoxin  (LANOXIN ) 0.125 MG tablet Take 1 Tablet every day except for Wednesdays and Sundays 09/15/23   Tammie Fall, MD  diltiazem  (CARDIZEM  CD) 120 MG 24 hr capsule Take 1 capsule (120 mg total) by mouth daily. 07/21/23 10/19/23  Debbie Fails, PA-C  ferrous sulfate  325 (65 FE) MG EC tablet Take 325 mg by mouth 2 (two) times daily.    [provider]  fexofenadine   (ALLERGY RELIEF) 180 MG tablet Take 1 tablet (180 mg total) by mouth daily. 11/18/23   [name], FNP  fluticasone  (FLONASE ) 50 MCG/ACT nasal spray Place 1 spray into both nostrils daily.    [provider]  Fluticasone -Umeclidin-Vilant (TRELEGY ELLIPTA ) 200-62.5-25 MCG/ACT AEPB Inhale 1 puff into the lungs daily. 07/17/23 07/16/24  Mannam, Praveen, MD  furosemide  (LASIX ) 20 MG tablet Take 1 tablet (20 mg total) by mouth daily. 12/19/22   Tammie Fall, MD  ipratropium-albuterol  (DUONEB) 0.5-2.5 (3) MG/3ML SOLN USE 1 VIAL VIA NEBULIZER TWICE DAILY 02/26/23   Mannam, Praveen, MD  MAGnesium -Oxide  400 (240 Mg) MG tablet Take 1 tablet (400 mg total) by mouth daily. Please contact the office to schedule appointment for additional refills. 06/26/22   Tammie Fall, MD  nitrofurantoin , macrocrystal-monohydrate, (MACROBID ) 100 MG capsule Take 1 capsule (100 mg total) by mouth 2 (two) times daily. 09/17/23   Heilingoetter, Cassandra L, PA-C  osimertinib  mesylate (TAGRISSO ) 40 MG tablet Take 1 tablet (40 mg total) by mouth daily. 09/15/23   Heilingoetter, Cassandra L, PA-C  PARoxetine  (PAXIL ) 20 MG tablet Take 1 tablet (20 mg total) by mouth 2 (two) times daily. 11/18/23   [name], FNP  vitamin B-12 (CYANOCOBALAMIN ) 1000 MCG tablet Take 1,000 mcg by mouth at bedtime.     [provider]  Vitamin D , Ergocalciferol , (DRISDOL ) 1.25 MG (50000 UNIT) CAPS capsule TAKE 1 CAPSULE BY MOUTH 1 TIME A WEEK 08/19/23   Cameron Cea, MD    Physical Exam: Vitals:   12/15/23 1354 12/15/23 1703  BP: (!) 156/80 (!) 149/66  Pulse: 76 79  Resp: 20 16  Temp: 99.2 F (37.3 C) 98.9 F (37.2 C)  TempSrc: Oral Oral  SpO2: 94% 100%  Weight: 92.5 kg   Height: 5\' 4"  (1.626 m)    *** Data Reviewed: {Tip this will not be part of the note when signed- Document your independent interpretation of telemetry tracing, EKG, lab, Radiology test or any other diagnostic tests. Add any new diagnostic  test ordered today. (Optional):26781} {Results:26384}  Assessment and Plan: No notes have been filed under this hospital service. Service: Hospitalist     Advance Care Planning:   Code Status: Prior ***  Consults: ***  Family Communication: ***  Severity of Illness: {Observation/Inpatient:21159}  AuthorCarolin Chyle, MD 12/15/2023 8:14 PM  For on call review [email or web address].

## 2023-12-15 NOTE — ED Provider Notes (Signed)
 Emergency Department Provider Note   I have reviewed the triage vital signs and the nursing notes.   HISTORY  Chief Complaint Altered Mental Status   HPI Tricia Hernandez is a 89 y.o. female with history of CHF and anal cancer with colostomy and frequent UTIs on suppressive Macrobid  presents to the emergency department with increased confusion and visual hallucinations.  Symptoms have been worsening over the past 2 weeks.  Her daughter is at bedside and provides most of the history.  Patient is on Macrobid  daily and followed by urology.  When symptoms developed suddenly she was changed to twice daily Macrobid  with no improvement.  1 week ago she was started on an additional antibiotic (unknown name) and has been taking that for the last week with no improvement.  She did have a fall a month ago but symptoms began a week or two later. Patient has no complaints.    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 5/2 EGD: grade A reflux esophagitis.  Mild, non-hemorrhagic gastritis.  Ablation of 3 Duodenal AVMs   Herpes zoster    History of blood transfusion    for vaginal, uterine bleeding leading to hysterectomy.  in 12/2016 transfused x 1 for GI bleed.    History of hiatal hernia    Hyperlipidemia    Hypertension    Labyrinthitis    Lung cancer (HCC)    RML   Obesity    On home oxygen  therapy    "3L just at night when I sleep" (04/20/2018)   Paroxysmal supraventricular tachycardia (HCC)    Peptic ulcer of stomach     hx   Pneumonia 1980s X 1; 08/2016   "walking; double"   Presence of permanent cardiac pacemaker 04/20/2018   Pulmonary embolism (HCC) 05/2014   "left lung"   Seasonal allergies    "bad" (04/20/2018)   Vitamin D  deficiency     Review of Systems  Constitutional: No fever/chills. Positive confusion.  Cardiovascular: Denies chest pain. Respiratory: Denies shortness of breath. Gastrointestinal: No abdominal pain.  No nausea, no vomiting.    ____________________________________________   PHYSICAL EXAM:  VITAL SIGNS: ED Triage Vitals [12/15/23 1354]  Encounter Vitals Group     BP (!) 156/80     Pulse Rate 76     Resp 20     Temp 99.2 F (37.3 C)     Temp Source Oral     SpO2 94 %     Weight 204 lb (92.5 kg)     Height 5\' 4"  (1.626 m)   Constitutional: Alert with mild confusion. Well appearing and in no acute distress. Eyes: Conjunctivae are normal.  Head: Atraumatic. Nose: No congestion/rhinnorhea. Mouth/Throat: Mucous membranes are moist. Neck: No stridor.   Cardiovascular: Normal rate, regular rhythm. Good peripheral circulation. Grossly normal heart sounds.   Respiratory: Normal respiratory effort.  No retractions. Lungs CTAB. Gastrointestinal: Soft and  nontender. No distention.  Musculoskeletal: No gross deformities of extremities. Neurologic:  Normal speech and language.   ____________________________________________   LABS (all labs ordered are listed, but only abnormal results are displayed)  Labs Reviewed  CBC WITH DIFFERENTIAL/PLATELET - Abnormal; Notable for the following components:      Result Value   Hemoglobin 9.8 (*)    HCT 33.4 (*)    MCH 24.9 (*)    MCHC 29.3 (*)    RDW 16.9 (*)    Platelets 90 (*)    All other components within normal limits  BASIC METABOLIC PANEL WITH GFR - Abnormal; Notable for the following components:   Creatinine, Ser 1.07 (*)    Calcium  8.7 (*)    GFR, Estimated 49 (*)    All other components within normal limits   URINE CULTURE  URINALYSIS, ROUTINE W REFLEX MICROSCOPIC  ETHANOL  HEPATIC FUNCTION PANEL  LIPASE, BLOOD  DIGOXIN  LEVEL   ____________________________________________  RADIOLOGY  No results found.  ____________________________________________   PROCEDURES  Procedure(s) performed:   Procedures  None  ____________________________________________   INITIAL IMPRESSION / ASSESSMENT AND PLAN / ED COURSE  Pertinent labs & imaging results that were available during my care of the patient were reviewed by me and considered in my medical decision making (see chart for details).   This patient is Presenting for Evaluation of AMS, which does require a range of treatment options, and is a complaint that involves a high risk of morbidity and mortality.  The Differential Diagnoses includes but is not exclusive to alcohol, illicit or prescription medications, intracranial pathology such as stroke, intracerebral hemorrhage, fever or infectious causes including sepsis, hypoxemia, uremia, trauma, endocrine related disorders such as diabetes, hypoglycemia, thyroid -related diseases, etc.   Critical Interventions-    Medications  sodium chloride  0.9 % bolus 500 mL (has no administration in time range)    Reassessment after intervention: no change in symptoms.    I did obtain Additional Historical Information from daughter at bedside.    Clinical Laboratory Tests Ordered, included CBC without leukocytosis.  Mild anemia to 9.8.  Platelets of 90.  These values are similar to prior. BMP with normal creatinine. ***  Radiologic Tests Ordered, included CXR and CT head. I independently interpreted the images and agree with radiology interpretation.   Cardiac Monitor Tracing which shows NSR.    Social Determinants of Health Risk patient is a non-smoker.   Consult complete with  Medical Decision Making: Summary:  Patient presents emergency department with altered mental status thought to  be secondary to UTI.  She has been on escalating antibiotics managed by urology with no improvement.  Reevaluation with update and discussion with   ***Considered admission***  Patient's presentation is most consistent with acute presentation with potential threat to life or bodily function.   Disposition:   ____________________________________________  FINAL CLINICAL IMPRESSION(S) / ED DIAGNOSES  Final diagnoses:  None     NEW OUTPATIENT MEDICATIONS STARTED DURING THIS VISIT:  New Prescriptions   No medications on file    Note:  This document was prepared using Dragon voice recognition software and may include unintentional dictation errors.  Abby Hocking, MD, [facility]

## 2023-12-16 ENCOUNTER — Telehealth: Payer: Self-pay

## 2023-12-16 DIAGNOSIS — G9341 Metabolic encephalopathy: Secondary | ICD-10-CM | POA: Diagnosis not present

## 2023-12-16 LAB — CBC
HCT: 30.6 % — ABNORMAL LOW (ref 36.0–46.0)
Hemoglobin: 9.3 g/dL — ABNORMAL LOW (ref 12.0–15.0)
MCH: 25.8 pg — ABNORMAL LOW (ref 26.0–34.0)
MCHC: 30.4 g/dL (ref 30.0–36.0)
MCV: 84.8 fL (ref 80.0–100.0)
Platelets: 84 10*3/uL — ABNORMAL LOW (ref 150–400)
RBC: 3.61 MIL/uL — ABNORMAL LOW (ref 3.87–5.11)
RDW: 16.6 % — ABNORMAL HIGH (ref 11.5–15.5)
WBC: 5.7 10*3/uL (ref 4.0–10.5)
nRBC: 0 % (ref 0.0–0.2)

## 2023-12-16 LAB — COMPREHENSIVE METABOLIC PANEL WITH GFR
ALT: 12 U/L (ref 0–44)
AST: 15 U/L (ref 15–41)
Albumin: 3.3 g/dL — ABNORMAL LOW (ref 3.5–5.0)
Alkaline Phosphatase: 68 U/L (ref 38–126)
Anion gap: 9 (ref 5–15)
BUN: 17 mg/dL (ref 8–23)
CO2: 32 mmol/L (ref 22–32)
Calcium: 8.6 mg/dL — ABNORMAL LOW (ref 8.9–10.3)
Chloride: 99 mmol/L (ref 98–111)
Creatinine, Ser: 0.97 mg/dL (ref 0.44–1.00)
GFR, Estimated: 56 mL/min — ABNORMAL LOW (ref >60)
Glucose, Bld: 104 mg/dL — ABNORMAL HIGH (ref 70–99)
Potassium: 3.6 mmol/L (ref 3.5–5.1)
Sodium: 140 mmol/L (ref 135–145)
Total Bilirubin: 0.9 mg/dL (ref 0.0–1.2)
Total Protein: 6.2 g/dL — ABNORMAL LOW (ref 6.5–8.1)

## 2023-12-16 LAB — URINE CULTURE: Culture: NO GROWTH

## 2023-12-16 MED ORDER — BUDESON-GLYCOPYRROL-FORMOTEROL 160-9-4.8 MCG/ACT IN AERO
2.0000 | INHALATION_SPRAY | Freq: Two times a day (BID) | RESPIRATORY_TRACT | Status: DC
  Administered 2023-12-16 – 2023-12-19 (×7): 2 via RESPIRATORY_TRACT
  Filled 2023-12-16: qty 5.9

## 2023-12-16 MED ORDER — ATORVASTATIN CALCIUM 10 MG PO TABS
20.0000 mg | ORAL_TABLET | Freq: Every day | ORAL | Status: DC
Start: 2023-12-16 — End: 2023-12-16

## 2023-12-16 MED ORDER — OSIMERTINIB MESYLATE 40 MG PO TABS: 40.0000 mg | ORAL_TABLET | Freq: Every day | ORAL | Status: DC

## 2023-12-16 MED ORDER — DIGOXIN 125 MCG PO TABS: 0.1250 mg | ORAL_TABLET | ORAL | Status: DC

## 2023-12-16 MED ORDER — DILTIAZEM HCL ER COATED BEADS 120 MG PO CP24
120.0000 mg | ORAL_CAPSULE | Freq: Every day | ORAL | Status: DC
Start: 2023-12-16 — End: 2023-12-19
  Administered 2023-12-16 – 2023-12-19 (×4): 120 mg via ORAL
  Filled 2023-12-16 (×4): qty 1

## 2023-12-16 MED ORDER — DIGOXIN 125 MCG PO TABS
0.1250 mg | ORAL_TABLET | ORAL | Status: DC
  Administered 2023-12-17: 0.125 mg via ORAL
  Filled 2023-12-16: qty 1

## 2023-12-16 MED ORDER — ACETAMINOPHEN 325 MG PO TABS
650.0000 mg | ORAL_TABLET | Freq: Four times a day (QID) | ORAL | Status: DC | PRN
  Administered 2023-12-16 – 2023-12-18 (×2): 650 mg via ORAL
  Filled 2023-12-16 (×2): qty 2

## 2023-12-16 MED ORDER — FLUTICASONE PROPIONATE 50 MCG/ACT NA SUSP
1.0000 | Freq: Every day | NASAL | Status: DC
  Administered 2023-12-16 – 2023-12-19 (×4): 1 via NASAL
  Filled 2023-12-16: qty 16

## 2023-12-16 MED ORDER — PAROXETINE HCL 20 MG PO TABS: 20.0000 mg | ORAL_TABLET | Freq: Two times a day (BID) | ORAL | Status: DC

## 2023-12-16 MED ORDER — ORAL CARE MOUTH RINSE: 15.0000 mL | OROMUCOSAL | Status: DC | PRN

## 2023-12-16 MED ORDER — PAROXETINE HCL 20 MG PO TABS
20.0000 mg | ORAL_TABLET | Freq: Every day | ORAL | Status: DC
  Administered 2023-12-16 – 2023-12-18 (×3): 20 mg via ORAL
  Filled 2023-12-16 (×3): qty 1

## 2023-12-16 NOTE — Hospital Course (Signed)
 89yo with h/o afib, anxiety d/o, SCC of the anus s/p colostomy, HLD, and lung cancer who presented on 5/5 with AMS.  Concern for UTI despite chronic suppression therapy with Macrobid  but UA was clear.  Suspected to be due to polypharmacy.

## 2023-12-16 NOTE — Progress Notes (Signed)
 Prior-To-Admission Oral Chemotherapy for Treatment of Oncologic Disease   Order noted from Dr. Carlton Chick to continue prior-to-admission oral chemotherapy regimen of Tagrisso  40mg  daily.  Procedure Per Pharmacy & Therapeutics Committee Policy: Orders for continuation of home oral chemotherapy for treatment of an oncologic disease will be held unless approved by an oncologist during current admission.    For patients receiving oncology care at [facility], inpatient pharmacist contacts patient's oncologist during regular office hours to review. If earlier review is medically necessary, attending physician consults [facility] on-call oncologist   For patients receiving oncology care outside of [facility], attending physician consults patient's oncologist to review. If this oncologist or their coverage cannot be reached, attending physician consults [facility] on-call oncologist   Oral chemotherapy continuation order is on hold pending oncologist review, [facility] oncologist Marguerita Shih will be notified by inpatient pharmacy during office hours     Arie Kurtz PharmD 12/16/2023, 12:27 AM

## 2023-12-16 NOTE — TOC Initial Note (Signed)
 Transition of Care [facility]) - Initial/Assessment Note    Patient Details  Name: Tricia Hernandez MRN: [id number] Date of Birth: [birth year]  Transition of Care [facility]) CM/SW Contact:    Tricia Parish, RN Phone Number: 12/16/2023, 4:41 PM  Clinical Narrative:                 NCM spoke with patient and daughter Tricia Hernandez [facility]).Currently active with [facility]. Patient lives alone in home; PCP and insurance verified; DME rollator, hospital bed, [place], WC, chair lift and oxygen  wit [facility] [place], [place]) for use at 2L nightly(no travel tanks). No HH aids or SDOH needs. Tricia Hernandez will transport at discharge. TOC will follow for oxygen  change requirements.  Expected Discharge Plan: Home/Self Care Barriers to Discharge: Continued Medical Work up   Patient Goals and CMS Choice Patient states their goals for this hospitalization and ongoing recovery are:: Home alone          Expected Discharge Plan and Services   Discharge Planning Services: CM Consult Post Acute Care Choice: Durable Medical Equipment ([facility] for O2) Living arrangements for the past 2 months: Single Family Home                 DME Arranged: N/A DME Agency: NA       HH Arranged: NA HH Agency: NA        Prior Living Arrangements/Services Living arrangements for the past 2 months: Single Family Home Lives with:: Self Patient language and need for interpreter reviewed:: Yes Do you feel safe going back to the place where you live?: Yes      Need for Family Participation in Patient Care: Yes (Comment) Care giver support system in place?: Yes (comment) Current home services: DME ([facility] for 02) Criminal Activity/Legal Involvement Pertinent to Current Situation/Hospitalization: No - Comment as needed  Activities of Daily Living   ADL Screening (condition at time of admission) Independently performs ADLs?: Yes (appropriate for developmental age) Is the patient deaf or have difficulty  hearing?: Yes Does the patient have difficulty seeing, even when wearing glasses/contacts?: No Does the patient have difficulty concentrating, remembering, or making decisions?: Yes  Permission Sought/Granted Permission sought to share information with : Case Manager Permission granted to share information with : Yes, Verbal Permission Granted  Share Information with NAME: Tricia Hernandez  Permission granted to share info w AGENCY: [facility]  Permission granted to share info w Relationship: daughter  Permission granted to share info w Contact Information: [phone]  Emotional Assessment Appearance:: Appears stated age Attitude/Demeanor/Rapport: Engaged Affect (typically observed): Accepting Orientation: : Oriented to Self Alcohol / Substance Use: Not Applicable Psych Involvement: No (comment)  Admission diagnosis:  Altered mental status, unspecified altered mental status type [R41.82] Acute metabolic encephalopathy [G93.41] Patient Active Problem List   Diagnosis Date Noted   Acute metabolic encephalopathy 12/15/2023   COPD with chronic bronchitis and emphysema (HCC) 03/23/2019   Anxiety and depression 11/02/2018   Allergic rhinitis 11/02/2018   Pacemaker 04/20/2018   Sinus node dysfunction (HCC) 04/14/2018   Vitamin D  deficiency 05/27/2017   Venous stasis dermatitis of both lower extremities 02/23/2017   Anemia 02/22/2017   Acid reflux 02/22/2017   Colostomy care (HCC) 12/20/2016   Angiodysplasia of duodenum    GI bleeding 12/10/2016   Mixed hyperlipidemia 11/05/2016   Chronic bronchitis (HCC) 11/04/2016   Persistent atrial fibrillation (HCC) 10/07/2016   Acute respiratory failure with hypoxia (HCC) 08/26/2016   Essential hypertension  Pulmonary nodules    Palliative care encounter    Anal cancer (HCC) 07/22/2016   Atrial fibrillation (HCC), CHA2DS2-VASc Score 5 07/22/2016   Pulmonary embolus (HCC)    Obesity    Lung cancer (HCC)    PCP:  [name],  FNP Pharmacy:   [facility] - [facility] [address] [place] [address] Phone: [phone] Fax: [phone]  [facility] [place], [place] - [address] [place] [address] Phone: [phone] Fax: [phone]  [id number] [place], [place] - [address] AT [facility] [address] [place] [address] Phone: [phone] Fax: [phone]     Social Drivers of Health (SDOH) Social History: SDOH Screenings   Food Insecurity: No Food Insecurity (12/16/2023)  Housing: Low Risk  (12/16/2023)  Transportation Needs: No Transportation Needs (12/16/2023)  Utilities: Not At Risk (12/16/2023)  Alcohol Screen: Low Risk  (02/26/2022)  Depression (PHQ2-9): Low Risk  (09/01/2023)  Financial Resource Strain: Low Risk  (02/26/2022)  Physical Activity: Inactive (02/26/2022)  Social Connections: Unknown (12/16/2023)  Stress: No Stress Concern Present (02/26/2022)  Tobacco Use: Medium Risk (12/15/2023)   SDOH Interventions:     Readmission Risk Interventions    12/16/2023    4:35 PM  Readmission Risk Prevention Plan  Post Dischage Appt Complete  Medication Screening Complete  Transportation Screening Complete

## 2023-12-16 NOTE — Plan of Care (Signed)

## 2023-12-16 NOTE — Progress Notes (Addendum)
 Progress Note   Patient: Tricia Hernandez ZOX:[id number] DOB: 1931/06/13 DOA: 12/15/2023     1 DOS: the patient was seen and examined on 12/16/2023   Brief hospital course: 89yo with h/o afib, anxiety d/o, SCC of the anus s/p colostomy, HLD, and lung cancer who presented on 5/5 with AMS.  Concern for UTI despite chronic suppression therapy with Macrobid  but UA was clear.  Suspected to be due to polypharmacy.  Assessment and Plan:  Acute metabolic encephalopathy Cause is unclear Concern for UTI but patient has been treated adequately and UA is essentially normal at this time so will hold off on antibiotic for UTI She has h/o stage 4 lung cancer Negative head CT but will order MRI She doesn't have obvious medications of concern Check NH4 level in AM   History of lung cancer Stage 4 She is on osimertinib , holding for now as inpatient Given her metastatic disease, it is reasonable to do an MRI brain with and without to see if she is having letpomeningeal spread or other mets not seen on head CT    Atrial fibrillation Rate controlled on digoxin , diltiazem  Not on chronic anticoagulation   Mixed hyperlipidemia Hold statin for now Long-term use should be discussed on an ongoing basis, as it probably is not significant efficacious in her circumstance   COPD No acute exacerbation Continue Trelegy (Breztri per formulary)   History of pulmonary embolism Patient is off anticoagulation due to risk of bleeding  Stage 3a CKD Appears to be stable at this time Attempt to avoid nephrotoxic medications Recheck BMP in AM   H/o anal cancer S/p colostomy No acute concerns at this time  Class 2 Obesity Body mass index is 35.02 kg/m.Aaron Aas  Weight loss should be encouraged Outpatient PCP/bariatric medicine f/u encouraged Significantly low or high BMI is associated with higher medical risk including morbidity and mortality      Consultants: Hospice PT OT TOC  team  Procedures: None  Antibiotics: None  30 Day Unplanned Readmission Risk Score    Flowsheet Row ED to Hosp-Admission (Current) from 12/15/2023 in [facility]  30 Day Unplanned Readmission Risk Score (%) 14.52 Filed at 12/16/2023 0401       This score is the patient's risk of an unplanned readmission within 30 days of being discharged (0 -100%). The score is based on dignosis, age, lab data, medications, orders, and past utilization.   Low:  0-14.9   Medium: 15-21.9   High: 22-29.9   Extreme: 30 and above           Subjective: Patient without complaints.  I spoke with her daughter.  She is not normally so confused and delusional.  She has been having these issues x 3 weeks.  She did have occasional confusion, word finding difficulties.  Today, her daughter was expecting her to be better.  She has been on antibiotics for over a month for a UTI, has a rectovaginal fistula.  She is not a candidate to have this fixed.  She has been taking Macrobid  daily for prophylaxis and she started with the confusion after that.  She had a culture that came back as infection, doubled to BID x 7 days and didn't get much better at all.  She went back to Dr. Sherrine Dolly last week and she was treated with another antibiotic for 7 days and then was started back on Macrobid .  Not helping at all.  Her only other new medication is fexofenadine  in  April.  She was changed to Trelegy, but she isn't very good at taking it.  She lives alone; her daughter goes every morning or her brother goes.  Her daughter also goes every evening.    She is in hospice, no heroic measures but treat the treatable.  They are open to rehab, if needed.   Objective: Vitals:   12/16/23 1019 12/16/23 1118  BP: 137/83 132/79  Pulse: 76 76  Resp: 16 16  Temp: 98.6 F (37 C) 98.6 F (37 C)  SpO2: 100% 100%    Intake/Output Summary (Last 24 hours) at 12/16/2023 1849 Last data filed at 12/16/2023  1800 Gross per 24 hour  Intake 2368.96 ml  Output 1000 ml  Net 1368.96 ml   Filed Weights   12/15/23 1354  Weight: 92.5 kg    Exam:  General:  Appears calm and comfortable and is in NAD, confused, very conversant but mostly nonsensical Eyes:   EOMI, normal lids, iris ENT:  grossly normal hearing, lips & tongue, mmm Cardiovascular:  RRR. No LE edema.  Respiratory:   CTA bilaterally with no wheezes/rales/rhonchi.  Normal respiratory effort. Abdomen:  soft, NT, ND Skin:  no rash or induration seen on limited exam Musculoskeletal:  grossly normal tone BUE/BLE, good ROM, no bony abnormality Psychiatric:  delusional mood and affect, speech fluent but somewhat inappropriate Neurologic:  CN 2-12 grossly intact, moves all extremities in coordinated fashion  Data Reviewed: I have reviewed the patient's lab results since admission.  Pertinent labs for today include:   BUN 17/Creatinine 0.97/GFR 56, stable Albumin  3.3 WBC 5.7 Hgb 9.3 Platelets 84 UA: 5 ketones    Family Communication: None present; I spoke with her daughter by telephone  Disposition: Status is: Inpatient Remains inpatient appropriate because: ongoing monitoring     Time spent: 50 minutes  Unresulted Labs (From admission, onward)     Start     Ordered   12/17/23 0500  CBC with Differential/Platelet  Tomorrow morning,   R        12/16/23 1846   12/17/23 0500  Basic metabolic panel with GFR  Tomorrow morning,   R        12/16/23 1846   12/17/23 0500  Ammonia  Tomorrow morning,   R        12/16/23 1846   12/15/23 1516  Urine Culture  Once,   URGENT       Question:  Indication  Answer:  Altered mental status (if no other cause identified)   12/15/23 1517             Author: Lorita Rosa, MD 12/16/2023 6:49 PM  For on call review [email or web address].

## 2023-12-16 NOTE — Telephone Encounter (Signed)
 Spoke with inpatient pharmacy, Melissa in regards to patient proceeding with Tagrisso .  Per Dr. Marguerita Shih- hold Tagrisso  until patient is better. Melissa informed via secure chat.

## 2023-12-16 NOTE — Progress Notes (Signed)
 Prior-To-Admission Oral Chemotherapy for Treatment of Oncologic Disease   Order noted from Dr. Carlton Chick to continue prior-to-admission oral chemotherapy regimen of Tagrisso  40mg  daily and oncologist contacted by inpatient pharmacist.  Procedure Per Pharmacy & Therapeutics Committee Policy: Orders for continuation of home oral chemotherapy for treatment of an oncologic disease will be held unless approved by an oncologist during current admission.    For patients receiving oncology care at [facility], inpatient pharmacist contacts patient's oncologist during regular office hours to review. If earlier review is medically necessary, attending physician consults [facility] on-call oncologist   For patients receiving oncology care outside of [facility], attending physician consults patient's oncologist to review. If this oncologist or their coverage cannot be reached, attending physician consults [facility] on-call oncologist   Today, 12/16/23 Per oncologist Lyndell Sanfilippo) order, hold Tagrisso .    Thank you for allowing pharmacy to be a part of this patient's care.  Alfredo Inch, PharmD, BCPS Clinical Pharmacist [place] 12/16/2023 9:53 AM

## 2023-12-16 NOTE — Plan of Care (Signed)

## 2023-12-17 ENCOUNTER — Other Ambulatory Visit: Payer: Self-pay

## 2023-12-17 DIAGNOSIS — G9341 Metabolic encephalopathy: Secondary | ICD-10-CM | POA: Diagnosis not present

## 2023-12-17 LAB — COMPREHENSIVE METABOLIC PANEL WITH GFR
ALT: 11 U/L (ref 0–44)
AST: 18 U/L (ref 15–41)
Albumin: 3.7 g/dL (ref 3.5–5.0)
Alkaline Phosphatase: 74 U/L (ref 38–126)
Anion gap: 11 (ref 5–15)
BUN: 15 mg/dL (ref 8–23)
CO2: 31 mmol/L (ref 22–32)
Calcium: 9 mg/dL (ref 8.9–10.3)
Chloride: 98 mmol/L (ref 98–111)
Creatinine, Ser: 1.08 mg/dL — ABNORMAL HIGH (ref 0.44–1.00)
GFR, Estimated: 49 mL/min — ABNORMAL LOW (ref >60)
Glucose, Bld: 99 mg/dL (ref 70–99)
Potassium: 3.8 mmol/L (ref 3.5–5.1)
Sodium: 140 mmol/L (ref 135–145)
Total Bilirubin: 0.6 mg/dL (ref 0.0–1.2)
Total Protein: 6.6 g/dL (ref 6.5–8.1)

## 2023-12-17 LAB — CBC WITH DIFFERENTIAL/PLATELET
Abs Immature Granulocytes: 0.02 10*3/uL (ref 0.00–0.07)
Basophils Absolute: 0 10*3/uL (ref 0.0–0.1)
Basophils Relative: 0 %
Eosinophils Absolute: 0.2 10*3/uL (ref 0.0–0.5)
Eosinophils Relative: 3 %
HCT: 28 % — ABNORMAL LOW (ref 36.0–46.0)
Hemoglobin: 8.4 g/dL — ABNORMAL LOW (ref 12.0–15.0)
Immature Granulocytes: 0 %
Lymphocytes Relative: 17 %
Lymphs Abs: 0.8 10*3/uL (ref 0.7–4.0)
MCH: 25.1 pg — ABNORMAL LOW (ref 26.0–34.0)
MCHC: 30 g/dL (ref 30.0–36.0)
MCV: 83.8 fL (ref 80.0–100.0)
Monocytes Absolute: 0.4 10*3/uL (ref 0.1–1.0)
Monocytes Relative: 8 %
Neutro Abs: 3.4 10*3/uL (ref 1.7–7.7)
Neutrophils Relative %: 72 %
Platelets: 80 10*3/uL — ABNORMAL LOW (ref 150–400)
RBC: 3.34 MIL/uL — ABNORMAL LOW (ref 3.87–5.11)
RDW: 16.6 % — ABNORMAL HIGH (ref 11.5–15.5)
WBC: 4.8 10*3/uL (ref 4.0–10.5)
nRBC: 0 % (ref 0.0–0.2)

## 2023-12-17 LAB — BASIC METABOLIC PANEL WITH GFR
Anion gap: 9 (ref 5–15)
BUN: 11 mg/dL (ref 8–23)
CO2: 30 mmol/L (ref 22–32)
Calcium: 8.2 mg/dL — ABNORMAL LOW (ref 8.9–10.3)
Chloride: 98 mmol/L (ref 98–111)
Creatinine, Ser: 1.04 mg/dL — ABNORMAL HIGH (ref 0.44–1.00)
GFR, Estimated: 51 mL/min — ABNORMAL LOW (ref >60)
Glucose, Bld: 98 mg/dL (ref 70–99)
Potassium: 3.7 mmol/L (ref 3.5–5.1)
Sodium: 137 mmol/L (ref 135–145)

## 2023-12-17 LAB — AMMONIA: Ammonia: 17 umol/L (ref 9–35)

## 2023-12-17 NOTE — Evaluation (Signed)
 Physical Therapy Evaluation Patient Details Name: Tricia Hernandez MRN: [id number] DOB: 1930-10-31 Today's Date: 12/17/2023  History of Present Illness  89yo female presented on 5/5 with AMS.  Suspected to be due to polypharmacy. Pt with h/o afib, anxiety d/o, SCC of the anus s/p colostomy, HLD, and lung cancer  Clinical Impression  Pt admitted with above diagnosis. Mod assist for supine to sit, min assist sit to stand, buckling of RLE noted in standing, pt reports she usually wears knee braces but they weren't in the room. Pt took several pivotal steps with RW to recliner with min assist. 24/7 assist recommended. Pt is very HOH and oriented to self only, so prior level of function and level of assistance available at home was difficult to ascertain. Patient will benefit from continued inpatient follow up therapy, <3 hours/day if family is unable to provide needed level of assistance.   Pt currently with functional limitations due to the deficits listed below (see PT Problem List). Pt will benefit from acute skilled PT to increase their independence and safety with mobility to allow discharge.           If plan is discharge home, recommend the following: A little help with walking and/or transfers;A little help with bathing/dressing/bathroom;Assistance with cooking/housework;Assist for transportation;Help with stairs or ramp for entrance   Can travel by private vehicle   Yes    Equipment Recommendations None recommended by PT  Recommendations for Other Services       Functional Status Assessment Patient has had a recent decline in their functional status and demonstrates the ability to make significant improvements in function in a reasonable and predictable amount of time.     Precautions / Restrictions Precautions Precautions: Fall Recall of Precautions/Restrictions: Impaired Precaution/Restrictions Comments: pt oriented to self only Restrictions Weight Bearing Restrictions Per  Provider Order: No      Mobility  Bed Mobility Overal bed mobility: Needs Assistance Bed Mobility: Supine to Sit     Supine to sit: Mod assist     General bed mobility comments: assist to raise trunk and pivot hips to edge of bed    Transfers Overall transfer level: Needs assistance Equipment used: Rolling walker (2 wheels) Transfers: Sit to/from Stand, Bed to chair/wheelchair/BSC Sit to Stand: Min assist, From elevated surface   Step pivot transfers: Contact guard assist       General transfer comment: assist to power up and to steady, pt had mild buckling of RLE in standing but was able to maintain balance with BUE support on RW, pt took several pivotal steps to recliner    Ambulation/Gait                  Stairs            Wheelchair Mobility     Tilt Bed    Modified Rankin (Stroke Patients Only)       Balance Overall balance assessment: Needs assistance Sitting-balance support: Feet supported, No upper extremity supported Sitting balance-Leahy Scale: Fair     Standing balance support: Bilateral upper extremity supported, Reliant on assistive device for balance, During functional activity Standing balance-Leahy Scale: Poor Standing balance comment: RLE buckled in standing, pt able to maintain balance with BUE support on RW                             Pertinent Vitals/Pain Pain Assessment Pain Assessment: Faces Faces Pain Scale: Hurts even more Pain  Location: R lower leg to touch Pain Descriptors / Indicators: Grimacing, Guarding Pain Intervention(s): Limited activity within patient's tolerance, Monitored during session, Repositioned    Home Living Family/patient expects to be discharged to:: Private residence   Available Help at Discharge: Family   Home Access: Stairs to enter       Home Layout: One level Home Equipment: Rollator (4 wheels);Rolling Walker (2 wheels) Additional Comments: pt very HOH, oriented to self  only, PLOF is unclear    Prior Function Prior Level of Function : Patient poor historian/Family not available             Mobility Comments: reports she walks with a rollator, reports wearing knee braces (these were not in room)       Extremity/Trunk Assessment   Upper Extremity Assessment Upper Extremity Assessment: Defer to OT evaluation    Lower Extremity Assessment Lower Extremity Assessment: Generalized weakness;Difficult to assess due to impaired cognition;LLE deficits/detail;RLE deficits/detail RLE Deficits / Details: pt reported pain with light touch to R lower leg, requested PT not touch her leg, reports wearing a knee brace because the knee "folds up". Pt very HOH, oriented to self only, difficult to fully assess strength/ROM. RLE did buckle in standing.    Cervical / Trunk Assessment Cervical / Trunk Assessment: Kyphotic  Communication   Communication Communication: Impaired Factors Affecting Communication: Hearing impaired    Cognition Arousal: Alert Behavior During Therapy: WFL for tasks assessed/performed   PT - Cognitive impairments: No family/caregiver present to determine baseline, Memory, Orientation   Orientation impairments: Place, Time, Situation                     Following commands: Impaired Following commands impaired: Follows one step commands inconsistently, Follows one step commands with increased time     Cueing Cueing Techniques: Verbal cues, Tactile cues     General Comments      Exercises     Assessment/Plan    PT Assessment Patient needs continued PT services  PT Problem List Decreased activity tolerance;Decreased strength;Decreased balance;Decreased mobility       PT Treatment Interventions Gait training;Therapeutic activities;Therapeutic exercise;Functional mobility training;Balance training;Patient/family education    PT Goals (Current goals can be found in the Care Plan section)  Acute Rehab PT Goals PT Goal  Formulation: Patient unable to participate in goal setting Time For Goal Achievement: 12/31/23 Potential to Achieve Goals: Good    Frequency Min 3X/week     Co-evaluation PT/OT/SLP Co-Evaluation/Treatment: Yes Reason for Co-Treatment: Complexity of the patient's impairments (multi-system involvement);For patient/therapist safety PT goals addressed during session: Mobility/safety with mobility;Balance         AM-PAC PT "6 Clicks" Mobility  Outcome Measure Help needed turning from your back to your side while in a flat bed without using bedrails?: A Little Help needed moving from lying on your back to sitting on the side of a flat bed without using bedrails?: A Lot Help needed moving to and from a bed to a chair (including a wheelchair)?: A Little Help needed standing up from a chair using your arms (e.g., wheelchair or bedside chair)?: A Little Help needed to walk in hospital room?: A Lot Help needed climbing 3-5 steps with a railing? : Total 6 Click Score: 14    End of Session Equipment Utilized During Treatment: Gait belt Activity Tolerance: Patient tolerated treatment well Patient left: in chair;with chair alarm set;with call bell/phone within reach Nurse Communication: Mobility status PT Visit Diagnosis: Unsteadiness on feet (R26.81);Difficulty  in walking, not elsewhere classified (R26.2);Pain Pain - Right/Left: Right Pain - part of body: Leg    Time: 0941-1000 PT Time Calculation (min) (ACUTE ONLY): 19 min   Charges:   PT Evaluation $PT Eval Moderate Complexity: 1 Mod   PT General Charges $$ ACUTE PT VISIT: 1 Visit         Daymon Evans PT 12/17/2023  Acute Rehabilitation Services  Office [phone]

## 2023-12-17 NOTE — Progress Notes (Signed)
 Pt has a pacemaker. Per Alena An, NP- For MRI: Device reviewed, NOT MRI conditional (mixed lead system).

## 2023-12-17 NOTE — Progress Notes (Addendum)
 Progress Note   Patient: Tricia Hernandez WNU:[id number] DOB: 06/09/1931 DOA: 12/15/2023     2 DOS: the patient was seen and examined on 12/17/2023   Brief hospital course: 89yo with h/o afib, anxiety d/o, SCC of the anus s/p colostomy, HLD, and lung cancer who presented on 5/5 with AMS.  Concern for UTI despite chronic suppression therapy with Macrobid  but UA was clear.  Suspected to be due to polypharmacy but brain mets are also a consideration.  She cannot have MRI due to her pacemaker.  Assessment and Plan:  Acute metabolic encephalopathy Cause is unclear Concern for UTI but patient has been treated adequately and UA is essentially normal at this time so not treating at this time She has h/o stage 4 lung cancer Negative head CT  She doesn't have obvious medications of concern Normal NH4 level Appears significantly improved today, ?cause Possible underlying cognitive impairment with delirium Family is open to palliative care However, she is NOT enrolled in hospice (this was an error) and they do want her to go to SNF rehab ([facility]) and can transition to hospice if this is an ongoing issue   History of lung cancer Stage 4 She is on osimertinib , holding for now as inpatient Given her metastatic disease, MRI brain ordered to see if she is having letpomeningeal spread or other mets not seen on head CT  Unfortunately, her pacemaker is not MRI compatible and so this cannot be done   Atrial fibrillation Rate controlled on digoxin , diltiazem  Not on chronic anticoagulation   Mixed hyperlipidemia Hold statin for now Long-term use should be discussed on an ongoing basis, as it probably is not significant efficacious in her circumstance   COPD No acute exacerbation Continue Trelegy (Breztri per formulary)   History of pulmonary embolism Patient is off anticoagulation due to risk of bleeding   Stage 3a CKD Appears to be stable at this time Attempt to avoid nephrotoxic  medications Recheck BMP in AM    H/o anal cancer S/p colostomy No acute concerns at this time   Class 2 Obesity Body mass index is 35.02 kg/m.Aaron Aas  Weight loss should be encouraged Outpatient PCP/bariatric medicine f/u encouraged Significantly low or high BMI is associated with higher medical risk including morbidity and mortality   DNR  I have discussed code status with the family and they are in agreement that the patient would not desire resuscitation and would prefer to die a natural death should that situation arise. [facility] documents reviewed Patient will need a gold out of facility DNR form at the time of discharge          Consultants:  PT OT TOC team   Procedures: None   Antibiotics: None   30 Day Unplanned Readmission Risk Score    Flowsheet Row ED to Hosp-Admission (Current) from 12/15/2023 in [facility]  30 Day Unplanned Readmission Risk Score (%) 12.62 Filed at 12/17/2023 0801       This score is the patient's risk of an unplanned readmission within 30 days of being discharged (0 -100%). The score is based on dignosis, age, lab data, medications, orders, and past utilization.   Low:  0-14.9   Medium: 15-21.9   High: 22-29.9   Extreme: 30 and above           Subjective: Much improved today.  Still confused with hallucinations but she is now oriented to hospital and aware that she has been sick.  Objective: Vitals:   12/17/23 1148 12/17/23 1300  BP:  126/69  Pulse: 88 80  Resp:  17  Temp:  98.6 F (37 C)  SpO2:  93%    Intake/Output Summary (Last 24 hours) at 12/17/2023 1648 Last data filed at 12/16/2023 2014 Gross per 24 hour  Intake 1628.96 ml  Output 800 ml  Net 828.96 ml   Filed Weights   12/15/23 1354  Weight: 92.5 kg    Exam:  General:  Appears calm and comfortable and is in NAD, confused but conversant  Eyes:   EOMI, normal lids, iris ENT:  grossly normal hearing, lips & tongue,  mmm Cardiovascular:  RRR. No LE edema.  Respiratory:   CTA bilaterally with no wheezes/rales/rhonchi.  Normal respiratory effort. Abdomen:  soft, NT, ND Skin:  no rash or induration seen on limited exam Musculoskeletal:  grossly normal tone BUE/BLE, good ROM, no bony abnormality Psychiatric:  delusional mood and affect, speech fluent but somewhat inappropriate Neurologic:  CN 2-12 grossly intact, moves all extremities in coordinated fashion  Data Reviewed: I have reviewed the patient's lab results since admission.  Pertinent labs for today include:   Stable BMP NH4 17 WBC 4.8 Hgb 8.4 Platelets 80     Family Communication: Son and daughter were present throughout evaluation  Disposition: Status is: Inpatient Remains inpatient appropriate because: ongoing monitoring     Time spent: 50 minutes  Unresulted Labs (From admission, onward)     Start     Ordered   Unscheduled  CBC with Differential/Platelet  Tomorrow morning,   R        12/17/23 1648   Unscheduled  Comprehensive metabolic panel  Once,   R        12/17/23 1648             Author: Lorita Rosa, MD 12/17/2023 4:48 PM  For on call review [email or web address].

## 2023-12-17 NOTE — Plan of Care (Signed)

## 2023-12-17 NOTE — Evaluation (Signed)
 Occupational Therapy Evaluation Patient Details Name: Tricia Hernandez MRN: [id number] DOB: 02-01-1931 Today's Date: 12/17/2023   History of Present Illness   Patient is a 89 year old female who presented on 5/5 with AMS.  patient was admitted with suspected polypharmacy. PMH: h/o afib, anxiety d/o, SCC of the anus s/p colostomy, HLD, and lung cancer     Clinical Impressions Patient is a 89 year old female who was admitted for above. Patient was living alone with support daily from daughter based on chart review and minimal info patient was able to communicate.patient was [facility] making communication difficult on top of patients confusion.  Patient was noted to have decreased functional activity tolerance, decreased endurance, decreased standing balance, decreased safety awareness, and decreased knowledge of AD/AE impacting participation in ADLs. Patient will need 24/7 caregiver support in next level of care. Patient will benefit from continued inpatient follow up therapy, <3 hours/day.       If plan is discharge home, recommend the following:   Two people to help with walking and/or transfers;A lot of help with bathing/dressing/bathroom;Assistance with cooking/housework;Direct supervision/assist for medications management;Help with stairs or ramp for entrance;Direct supervision/assist for financial management;Assist for transportation     Functional Status Assessment   Patient has had a recent decline in their functional status and demonstrates the ability to make significant improvements in function in a reasonable and predictable amount of time.     Equipment Recommendations   None recommended by OT      Precautions/Restrictions   Precautions Precautions: Fall Recall of Precautions/Restrictions: Impaired Precaution/Restrictions Comments: pt oriented to self only Restrictions Weight Bearing Restrictions Per Provider Order: No     Mobility Bed Mobility Overal bed mobility:  Needs Assistance Bed Mobility: Supine to Sit     Supine to sit: Mod assist     General bed mobility comments: assist to raise trunk and pivot hips to edge of bed          Balance Overall balance assessment: Needs assistance Sitting-balance support: Feet supported, No upper extremity supported Sitting balance-Leahy Scale: Fair     Standing balance support: Bilateral upper extremity supported, Reliant on assistive device for balance, During functional activity Standing balance-Leahy Scale: Poor Standing balance comment: RLE buckled in standing, pt able to maintain balance with BUE support on RW         ADL either performed or assessed with clinical judgement   ADL Overall ADL's : Needs assistance/impaired Eating/Feeding: Supervision/ safety;Sitting   Grooming: Sitting;Minimal assistance;Brushing hair Grooming Details (indicate cue type and reason): encouragement to complete task after asking therapist to do it for her. Upper Body Bathing: Sitting;Set up   Lower Body Bathing: Sitting/lateral leans;Total assistance   Upper Body Dressing : Set up;Sitting   Lower Body Dressing: Sitting/lateral leans;Total assistance   Toilet Transfer: +2 for physical assistance;+2 for safety/equipment;Minimal assistance Toilet Transfer Details (indicate cue type and reason): with RW to advance to recliner in room. Toileting- Architect and Hygiene: Sit to/from stand;Total assistance                Pertinent Vitals/Pain Pain Assessment Pain Assessment: Faces Faces Pain Scale: Hurts even more Pain Location: R lower leg to touch Pain Descriptors / Indicators: Grimacing, Guarding Pain Intervention(s): Limited activity within patient's tolerance, Monitored during session, Repositioned     Extremity/Trunk Assessment Upper Extremity Assessment Upper Extremity Assessment: Overall WFL for tasks assessed   Lower Extremity Assessment Lower Extremity Assessment: Defer to PT  evaluation (patient declining to have  BLE touched noted to have some redness and edema. patient indicated having a brace that she uses but none present in room) RLE Deficits / Details: pt reported pain with light touch to R lower leg, requested PT not touch her leg, reports wearing a knee brace because the knee "folds up". Pt very HOH, oriented to self only, difficult to fully assess strength/ROM. RLE did buckle in standing.   Cervical / Trunk Assessment Cervical / Trunk Assessment: Kyphotic   Communication Communication Communication: Impaired Factors Affecting Communication: Hearing impaired   Cognition Arousal: Alert Behavior During Therapy: WFL for tasks assessed/performed Cognition: Difficult to assess Difficult to assess due to: Hard of hearing/deaf           OT - Cognition Comments: patient did not have hearing aids during session. very HOH. patient reported " i didnt know i was here till i woke up" and "call my daughter she must be very worried im not home" even with reassuances that family was aware that patient was not home                 Following commands: Impaired Following commands impaired: Follows one step commands inconsistently, Follows one step commands with increased time     Cueing  General Comments   Cueing Techniques: Verbal cues;Tactile cues              Home Living Family/patient expects to be discharged to:: Private residence   Available Help at Discharge: Family   Home Access: Stairs to enter     Home Layout: One level               Home Equipment: Rollator (4 wheels);Rolling Walker (2 wheels)   Additional Comments: pt very HOH, oriented to self only, PLOF is unclear      Prior Functioning/Environment Prior Level of Function : Patient poor historian/Family not available             Mobility Comments: reports she walks with a rollator, reports wearing knee braces (these were not in room)      OT Problem List:  Impaired balance (sitting and/or standing);Decreased knowledge of precautions;Decreased safety awareness;Decreased activity tolerance;Decreased knowledge of use of DME or AE   OT Treatment/Interventions: Self-care/ADL training;DME and/or AE instruction;Therapeutic activities;Balance training;Energy conservation;Patient/family education      OT Goals(Current goals can be found in the care plan section)   Acute Rehab OT Goals Patient Stated Goal: to call daughter OT Goal Formulation: Patient unable to participate in goal setting Time For Goal Achievement: 12/31/23 Potential to Achieve Goals: Fair   OT Frequency:  Min 2X/week    Co-evaluation PT/OT/SLP Co-Evaluation/Treatment: Yes Reason for Co-Treatment: Complexity of the patient's impairments (multi-system involvement);For patient/therapist safety PT goals addressed during session: Mobility/safety with mobility;Balance OT goals addressed during session: ADL's and self-care      AM-PAC OT "6 Clicks" Daily Activity     Outcome Measure Help from another person eating meals?: A Little Help from another person taking care of personal grooming?: A Little Help from another person toileting, which includes using toliet, bedpan, or urinal?: A Lot Help from another person bathing (including washing, rinsing, drying)?: A Lot Help from another person to put on and taking off regular upper body clothing?: A Little Help from another person to put on and taking off regular lower body clothing?: A Lot 6 Click Score: 15   End of Session Equipment Utilized During Treatment: Rolling walker (2 wheels);Gait belt  Activity Tolerance: Patient tolerated treatment  well Patient left: in chair;with call bell/phone within reach;with chair alarm set  OT Visit Diagnosis: Unsteadiness on feet (R26.81);Other abnormalities of gait and mobility (R26.89);Pain                Time: 4098-1191 OT Time Calculation (min): 18 min Charges:  OT General Charges $OT  Visit: 1 Visit OT Evaluation $OT Eval Low Complexity: 1 Low  Izan Miron OTR/L, MS Acute Rehabilitation Department Office# [phone]   Jame Maze 12/17/2023, 12:05 PM

## 2023-12-18 DIAGNOSIS — Z7189 Other specified counseling: Secondary | ICD-10-CM

## 2023-12-18 DIAGNOSIS — G9341 Metabolic encephalopathy: Secondary | ICD-10-CM | POA: Diagnosis not present

## 2023-12-18 DIAGNOSIS — C3491 Malignant neoplasm of unspecified part of right bronchus or lung: Secondary | ICD-10-CM

## 2023-12-18 DIAGNOSIS — R4589 Other symptoms and signs involving emotional state: Secondary | ICD-10-CM

## 2023-12-18 DIAGNOSIS — Z515 Encounter for palliative care: Secondary | ICD-10-CM

## 2023-12-18 LAB — CBC WITH DIFFERENTIAL/PLATELET
Abs Immature Granulocytes: 0.02 10*3/uL (ref 0.00–0.07)
Basophils Absolute: 0 10*3/uL (ref 0.0–0.1)
Basophils Relative: 0 %
Eosinophils Absolute: 0.2 10*3/uL (ref 0.0–0.5)
Eosinophils Relative: 3 %
HCT: 29.3 % — ABNORMAL LOW (ref 36.0–46.0)
Hemoglobin: 8.5 g/dL — ABNORMAL LOW (ref 12.0–15.0)
Immature Granulocytes: 0 %
Lymphocytes Relative: 12 %
Lymphs Abs: 0.7 10*3/uL (ref 0.7–4.0)
MCH: 24.9 pg — ABNORMAL LOW (ref 26.0–34.0)
MCHC: 29 g/dL — ABNORMAL LOW (ref 30.0–36.0)
MCV: 85.7 fL (ref 80.0–100.0)
Monocytes Absolute: 0.4 10*3/uL (ref 0.1–1.0)
Monocytes Relative: 7 %
Neutro Abs: 4.5 10*3/uL (ref 1.7–7.7)
Neutrophils Relative %: 78 %
Platelets: 83 10*3/uL — ABNORMAL LOW (ref 150–400)
RBC: 3.42 MIL/uL — ABNORMAL LOW (ref 3.87–5.11)
RDW: 16.9 % — ABNORMAL HIGH (ref 11.5–15.5)
WBC: 5.8 10*3/uL (ref 4.0–10.5)
nRBC: 0 % (ref 0.0–0.2)

## 2023-12-18 NOTE — Plan of Care (Signed)
   Problem: Education: Goal: Knowledge of General Education information will improve Description Including pain rating scale, medication(s)/side effects and non-pharmacologic comfort measures Outcome: Progressing   Problem: Health Behavior/Discharge Planning: Goal: Ability to manage health-related needs will improve Outcome: Progressing

## 2023-12-18 NOTE — Progress Notes (Signed)
 Progress Note   Patient: Tricia Hernandez ZOX:[id number] DOB: 10-18-1930 DOA: 12/15/2023     3 DOS: the patient was seen and examined on 12/18/2023   Brief hospital course: 89yo with h/o afib, anxiety d/o, SCC of the anus s/p colostomy, HLD, and lung cancer who presented on 5/5 with AMS.  Concern for UTI despite chronic suppression therapy with Macrobid  but UA was clear.  Suspected to be due to polypharmacy but brain mets are also a consideration.  She cannot have MRI due to her pacemaker.  She has improved spontaneously and is medically ready for dc to SNF rehab.  Assessment and Plan:  Acute metabolic encephalopathy Cause is unclear Concern for UTI but patient has been treated adequately and UA is essentially normal at this time so not treating at this time She has h/o stage 4 lung cancer Negative head CT  She doesn't have obvious medications of concern Normal NH4 level Appears significantly improved today, ?cause Possible underlying cognitive impairment with delirium Family is open to palliative care, consult requested and she will want outpatient palliative care on an ongoing basis However, she is NOT enrolled in hospice (this was an error) and they do want her to go to SNF rehab ([facility] preferred) and can transition to hospice if this is an ongoing issue She continues to be stable and will likely dc to [facility] on 5/9   History of lung cancer Stage 4 She is on osimertinib , holding for now as inpatient Given her metastatic disease, MRI brain ordered to see if she is having letpomeningeal spread or other mets not seen on head CT  Unfortunately, her pacemaker is not MRI compatible and so this cannot be done   Atrial fibrillation Rate controlled on digoxin , diltiazem  Not on chronic anticoagulation   Mixed hyperlipidemia Hold statin for now Long-term use should be discussed on an ongoing basis, as it probably is not significant efficacious in her circumstance   COPD No acute  exacerbation Continue Trelegy (Breztri per formulary)   History of pulmonary embolism Patient is off anticoagulation due to risk of bleeding   Stage 3a CKD Appears to be stable at this time Attempt to avoid nephrotoxic medications Recheck BMP in AM    H/o anal cancer S/p colostomy No acute concerns at this time   Class 2 Obesity Body mass index is 35.02 kg/m.Aaron Aas  Weight loss should be encouraged Outpatient PCP/bariatric medicine f/u encouraged Significantly low or high BMI is associated with higher medical risk including morbidity and mortality    DNR  I have discussed code status with the family and they are in agreement that the patient would not desire resuscitation and would prefer to die a natural death should that situation arise. [facility] documents reviewed Patient will need a gold out of facility DNR form at the time of discharge             Consultants:   PT OT TOC team   Procedures: None   Antibiotics: None  30 Day Unplanned Readmission Risk Score    Flowsheet Row ED to Hosp-Admission (Current) from 12/15/2023 in [facility]  30 Day Unplanned Readmission Risk Score (%) 11.07 Filed at 12/18/2023 0801       This score is the patient's risk of an unplanned readmission within 30 days of being discharged (0 -100%). The score is based on dignosis, age, lab data, medications, orders, and past utilization.   Low:  0-14.9   Medium: 15-21.9  High: 22-29.9   Extreme: 30 and above           Subjective: She is calm and pleasant, aware that she is at a hospital in [place] (but not which one, including the city).  No specific complaints.   Objective: Vitals:   12/18/23 0917 12/18/23 1452  BP:    Pulse:    Resp:    Temp:    SpO2: 94% (!) 84%    Intake/Output Summary (Last 24 hours) at 12/18/2023 1548 Last data filed at 12/18/2023 1300 Gross per 24 hour  Intake 480 ml  Output 1450 ml  Net -970 ml   Filed Weights    12/15/23 1354  Weight: 92.5 kg    Exam:  General:  Appears calm and comfortable and is in NAD, conversant, told me she loves me when I left the room Eyes:   EOMI, normal lids, iris ENT:  grossly normal hearing, lips & tongue, mmm Cardiovascular:  RRR. No LE edema.  Respiratory:   CTA bilaterally with no wheezes/rales/rhonchi.  Normal respiratory effort. Abdomen:  soft, NT, ND Skin:  no rash or induration seen on limited exam Musculoskeletal:  grossly normal tone BUE/BLE, good ROM, no bony abnormality Psychiatric:  pleasant mood and affect, speech fluent and mostly appropriate, not frankly confused unless asked specific questions Neurologic:  CN 2-12 grossly intact, moves all extremities in coordinated fashion  Data Reviewed: I have reviewed the patient's lab results since admission.  Pertinent labs for today include:  WBC 5.8 Hgb 8.5, stable Platelets 83, stable    Family Communication: None present today  Disposition: Status is: Inpatient Remains inpatient appropriate because: needs placement     Time spent: 35 minutes  Unresulted Labs (From admission, onward)    None        Author: Lorita Rosa, MD 12/18/2023 3:48 PM  For on call review [email or web address].

## 2023-12-18 NOTE — NC FL2 (Signed)
 [place]  MEDICAID [id number] LEVEL OF CARE FORM     IDENTIFICATION  Patient Name: Tricia Hernandez Birthdate: 1931/04/24 Sex: female Admission Date (Current Location): 12/15/2023  [facility] and [place] Number:   ([place])   Facility and Address:  [facility],  [address] [place], [place] [address]      Provider Number: [id number]  Attending Physician Name and Address:  Lorita Rosa, MD  Relative Name and Phone Number:  Presley(POA),Sue (Daughter)  [phone] [facility])    Current Level of Care: Hospital Recommended Level of Care: Skilled Nursing Facility Prior Approval Number:    Date Approved/Denied:   PASRR Number: [id number]  Discharge Plan: SNF    Current Diagnoses: Patient Active Problem List   Diagnosis Date Noted   Acute metabolic encephalopathy 12/15/2023   COPD with chronic bronchitis and emphysema (HCC) 03/23/2019   Anxiety and depression 11/02/2018   Allergic rhinitis 11/02/2018   Pacemaker 04/20/2018   Sinus node dysfunction (HCC) 04/14/2018   Vitamin D  deficiency 05/27/2017   Venous stasis dermatitis of both lower extremities 02/23/2017   Anemia 02/22/2017   Acid reflux 02/22/2017   Colostomy care (HCC) 12/20/2016   Angiodysplasia of duodenum    GI bleeding 12/10/2016   Mixed hyperlipidemia 11/05/2016   Chronic bronchitis (HCC) 11/04/2016   Persistent atrial fibrillation (HCC) 10/07/2016   Acute respiratory failure with hypoxia (HCC) 08/26/2016   Essential hypertension    Pulmonary nodules    Palliative care encounter    Anal cancer (HCC) 07/22/2016   Atrial fibrillation (HCC), CHA2DS2-VASc Score 5 07/22/2016   Pulmonary embolus (HCC)    Obesity    Lung cancer (HCC)     Orientation RESPIRATION BLADDER Height & Weight     Self, Place    Incontinent, Continent Weight: 92.5 kg Height:  5\' 4"  (162.6 cm)  BEHAVIORAL SYMPTOMS/MOOD NEUROLOGICAL BOWEL NUTRITION STATUS      Continent Diet  AMBULATORY STATUS COMMUNICATION OF NEEDS Skin    Limited Assist Verbally Normal                       Personal Care Assistance Level of Assistance              Functional Limitations Info             SPECIAL CARE FACTORS FREQUENCY  PT (By licensed PT), OT (By licensed OT)     PT Frequency: 5X weekly OT Frequency: 5X weekly            Contractures Contractures Info: Not present    Additional Factors Info  Code Status, Allergies, Psychotropic Code Status Info: DNR Allergies Info: Penicillins, Antihistamines, Chlorpheniramine-type, Aspirin, Codeine, Hydrochlorothiazide, Sulfa Antibiotics Psychotropic Info: PARoxetine  (PAXIL )         Current Medications (12/18/2023):  This is the current hospital active medication list Current Facility-Administered Medications  Medication Dose Route Frequency Provider Last Rate Last Admin   acetaminophen  (TYLENOL ) tablet 650 mg  650 mg Oral Q6H PRN Lorita Rosa, MD   650 mg at 12/16/23 1607   budeson-glycopyrrolate-formoterol (BREZTRI) 160-9-4.8 MCG/ACT inhaler 2 puff  2 puff Inhalation BID Garba, Mohammad L, MD   2 puff at 12/17/23 2013   digoxin  (LANOXIN ) tablet 0.125 mg  0.125 mg Oral Once per day on Sunday Wednesday Lorita Rosa, MD   0.125 mg at 12/17/23 3762   diltiazem  (CARDIZEM  CD) 24 hr capsule 120 mg  120 mg Oral Daily Garba, Mohammad L, MD   120 mg at 12/17/23 [phone]  fluticasone  (FLONASE ) 50 MCG/ACT nasal spray 1 spray  1 spray Each Nare Daily Lorita Rosa, MD   1 spray at 12/17/23 0941   ondansetron  (ZOFRAN ) tablet 4 mg  4 mg Oral Q6H PRN Davida Espy, MD       Or   ondansetron  (ZOFRAN ) injection 4 mg  4 mg Intravenous Q6H PRN Garba, Mohammad L, MD       Oral care mouth rinse  15 mL Mouth Rinse PRN Davida Espy, MD       PARoxetine  (PAXIL ) tablet 20 mg  20 mg Oral QHS Lorita Rosa, MD   20 mg at 12/17/23 2013   Facility-Administered Medications Ordered in Other Encounters  Medication Dose Route Frequency Provider Last Rate Last Admin   sodium  phosphate (FLEET) 7-19 GM/118ML enema 2 enema  2 enema Rectal Once Melvenia Stabs, MD         Discharge Medications: Please see discharge summary for a list of discharge medications.  Relevant Imaging Results:  Relevant Lab Results:   Additional Information SS# [id number]  Kathryn Parish, RN

## 2023-12-18 NOTE — Progress Notes (Signed)
 Physical Therapy Treatment Patient Details Name: Tricia Hernandez MRN: [id number] DOB: April 24, 1931 Today's Date: 12/18/2023   History of Present Illness 89yo female presented on 5/5 with AMS.  Suspected to be due to polypharmacy. Pt with h/o afib, anxiety d/o, SCC of the anus s/p colostomy, HLD, and lung cancer    PT Comments  Pt ambulated 50' with RW, distance limited by fatigue/dyspnea, SpO2 84% on room air walking, 91% on 2L O2 after ~2 minutes seated rest. Pt performed seated BUE/LE exercises for strengthening.     If plan is discharge home, recommend the following: A little help with walking and/or transfers;A little help with bathing/dressing/bathroom;Assistance with cooking/housework;Assist for transportation;Help with stairs or ramp for entrance   Can travel by private vehicle     Yes  Equipment Recommendations  None recommended by PT    Recommendations for Other Services       Precautions / Restrictions Precautions Precautions: Fall Recall of Precautions/Restrictions: Impaired Precaution/Restrictions Comments: pt oriented to self only, HOH Restrictions Weight Bearing Restrictions Per Provider Order: No     Mobility  Bed Mobility               General bed mobility comments: up in recliner    Transfers Overall transfer level: Needs assistance Equipment used: Rolling walker (2 wheels) Transfers: Sit to/from Stand Sit to Stand: Min assist           General transfer comment: assist to power up    Ambulation/Gait Ambulation/Gait assistance: Contact guard assist Gait Distance (Feet): 50 Feet Assistive device: Rolling walker (2 wheels) Gait Pattern/deviations: Step-through pattern, Decreased stride length, Trunk flexed Gait velocity: decr     General Gait Details: VCs for proximity to RW, distance limited by fatigue/dyspnea, SpO2 84% on room air walking, 91% on 2L O2 after ~2 minutes seated rest. Daughter reports pt just wears O2 at night at  baseline.   Stairs             Wheelchair Mobility     Tilt Bed    Modified Rankin (Stroke Patients Only)       Balance Overall balance assessment: Needs assistance Sitting-balance support: Feet supported, No upper extremity supported Sitting balance-Leahy Scale: Fair     Standing balance support: Bilateral upper extremity supported, Reliant on assistive device for balance, During functional activity Standing balance-Leahy Scale: Poor                              Communication Communication Communication: Impaired Factors Affecting Communication: Hearing impaired  Cognition Arousal: Alert Behavior During Therapy: WFL for tasks assessed/performed   PT - Cognitive impairments: Memory, Orientation   Orientation impairments: Place, Time, Situation                     Following commands: Impaired Following commands impaired: Follows one step commands inconsistently, Follows one step commands with increased time    Cueing Cueing Techniques: Verbal cues, Tactile cues  Exercises General Exercises - Upper Extremity Shoulder Flexion: AROM, Both, 10 reps, Seated General Exercises - Lower Extremity Long Arc Quad: AROM, Both, 10 reps, Seated Hip Flexion/Marching: AROM, Both, 10 reps, Seated    General Comments        Pertinent Vitals/Pain Pain Assessment Faces Pain Scale: Hurts little more Pain Location: headache Pain Descriptors / Indicators: Aching Pain Intervention(s): Limited activity within patient's tolerance, Monitored during session, RN gave pain meds during session    Home  Living                          Prior Function            PT Goals (current goals can now be found in the care plan section) Acute Rehab PT Goals PT Goal Formulation: With family Time For Goal Achievement: 12/31/23 Potential to Achieve Goals: Good Progress towards PT goals: Progressing toward goals    Frequency    Min 3X/week      PT  Plan      Co-evaluation PT/OT/SLP Co-Evaluation/Treatment: Yes            AM-PAC PT "6 Clicks" Mobility   Outcome Measure  Help needed turning from your back to your side while in a flat bed without using bedrails?: A Little Help needed moving from lying on your back to sitting on the side of a flat bed without using bedrails?: A Lot Help needed moving to and from a bed to a chair (including a wheelchair)?: A Little Help needed standing up from a chair using your arms (e.g., wheelchair or bedside chair)?: A Little Help needed to walk in hospital room?: A Little Help needed climbing 3-5 steps with a railing? : A Lot 6 Click Score: 16    End of Session Equipment Utilized During Treatment: Gait belt Activity Tolerance: Patient tolerated treatment well Patient left: in chair;with chair alarm set;with call bell/phone within reach;with family/visitor present Nurse Communication: Mobility status PT Visit Diagnosis: Unsteadiness on feet (R26.81);Difficulty in walking, not elsewhere classified (R26.2);Pain     Time: 1430-1446 PT Time Calculation (min) (ACUTE ONLY): 16 min  Charges:    $Gait Training: 8-22 mins PT General Charges $$ ACUTE PT VISIT: 1 Visit                     Daymon Evans PT 12/18/2023  Acute Rehabilitation Services  Office [phone]

## 2023-12-18 NOTE — Plan of Care (Signed)

## 2023-12-18 NOTE — Consult Note (Addendum)
 Consultation Note Date: 12/18/2023   Patient Name: Tricia Hernandez  DOB: 10-28-1930  MRN: [id number]  Age / Sex: 89 y.o., female   PCP: [name], FNP Referring Physician: Lorita Rosa, MD  Reason for Consultation: Disposition and Establishing goals of care     Chief Complaint/History of Present Illness:   Patient is a 52 F with a past medical history of A-fib, anxiety, SCC of the anus status post colostomy, hyperlipidemia, and lung cancer who was admitted on 12/15/2023 for altered mental status.  There was concern at that time for UTI despite chronic suppression therapy with Macrobid .  Upon admission, because of acute metabolic encephalopathy unclear though thought to be multifactorial in setting of multiple underlying medical illnesses and polypharmacy.  Palliative medicine consulted to assist with complex medical decision making.  Extensive review of EMR prior to presenting to bedside.  Patient's mentation has already improved at this time.  Patient has been receiving therapy for stage IV lung cancer.  Unable to obtain MRI as patient has pacemaker that is not compatible so unable to rule out brain mets from this perspective.  PT/OT have evaluated the patient and recommended rehab. Patient's family had already discussed with hospitalist designed patient go to rehab to regain strength.  Family had discussed with hospitalist agreement to continue outpatient palliative medicine follow-up to continue conversations moving forward.  Family already discussed with hospitalist that if patient did not improve in the future or deteriorated, could then consider transition to hospice focused care.    Presented to bedside to see patient.  No visitors present at bedside.  Patient laying comfortably in bed.  Patient working on word finding puzzles.  Able to introduce myself as a member of the palliative medicine team and my role in patient's medical journey.  Patient incredibly hard of hearing and  noting that she is unable to wear her hearing aids here in the hospital which makes it more difficult.  Spent time trying to learn about her medical journey.  Patient currently stating that she feels much better and she is motivated to participate with PT/OT to regain her strength.  Patient is willing to go to rehab for this.  Patient does not have any symptom concerns at this time.  Patient noted that she enjoys things like word finding puzzles to keep her mind active.  Patient also enjoys spending time with her family.  Spent time providing emotional support reactive listening.  Patient voiced appreciation for the visit and company today.  Acknowledged and noted palliative medicine team will continue following along to engage in conversations as appropriate; likely on the outpatient side to determine how patient will do in rehab.  Thanked patient for allowing me to visit with her today.  Discussed care with hospitalist, TOC, and RN to coordinate care.  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 5/2 EGD: grade A reflux esophagitis.  Mild, non-hemorrhagic gastritis.  Ablation of 3  Duodenal AVMs   Herpes zoster    History of blood transfusion    for vaginal, uterine bleeding leading to hysterectomy.  in 12/2016 transfused x 1 for GI bleed.    History of hiatal hernia    Hyperlipidemia    Hypertension    Labyrinthitis    Lung cancer (HCC)    RML   Obesity    On home oxygen  therapy    "3L just at night when I sleep" (04/20/2018)   Paroxysmal supraventricular tachycardia (HCC)    Peptic ulcer of stomach    hx   Pneumonia 1980s X 1; 08/2016   "walking; double"   Presence of permanent cardiac pacemaker 04/20/2018   Pulmonary embolism (HCC) 05/2014   "left lung"   Seasonal allergies    "bad" (04/20/2018)   Vitamin D  deficiency    Social History   Socioeconomic History   Marital status: Widowed    Spouse name: Not on file   Number of children: 5   Years of education: Not on file   Highest education level: Not on file  Occupational History   Occupation: retired  Tobacco Use   Smoking status: Former    Types: Cigarettes   Smokeless tobacco: Never   Tobacco comments:    04/20/2018 "smoked once in awhile in the 1960s; not more that 4 packs total in my whole life"  Vaping Use   Vaping status: Never Used  Substance and Sexual Activity   Alcohol use: Never   Drug use: Never   Sexual activity: Not Currently  Other Topics Concern   Not on file  Social History Narrative   Patient is widowed and retired she has 5 children, she lives alone and performs all of her ADLs   Former smoker, never alcohol never drug use   Social Drivers of Corporate investment banker Strain: Low Risk  (02/26/2022)   Overall Financial Resource Strain (CARDIA)    Difficulty of Paying Living Expenses: Not hard at all  Food Insecurity: No Food Insecurity (12/16/2023)   Hunger Vital Sign    Worried About Running Out of Food in the Last Year: Never true    Ran Out of Food in the Last Year: Never true  Transportation Needs: No Transportation Needs (12/16/2023)   PRAPARE - Therapist, art (Medical): No    Lack of Transportation (Non-Medical): No  Physical Activity: Inactive (02/26/2022)   Exercise Vital Sign    Days of Exercise per Week: 0 days    Minutes of Exercise per Session: 0 min  Stress: No Stress Concern Present (02/26/2022)   [employer] of Occupational Health - Occupational Stress Questionnaire    Feeling of Stress : Not at all  Social Connections: Unknown (12/16/2023)   Social Connection and Isolation Panel [NHANES]    Frequency of Communication with Friends and Family: More than three times a week    Frequency of Social Gatherings with Friends and Family: More than three times a week    Attends Religious Services: Patient unable to answer    Active Member of Clubs or Organizations: Patient unable to answer  Attends Banker Meetings: Patient unable to answer    Marital Status: Widowed   Family History  Problem Relation Age of Onset   Heart attack Mother        Dec 1987   CVA Mother    Hypertension Mother    Multiple myeloma Mother    Breast cancer Sister    Skin cancer Sister    Prostate cancer Brother    Throat cancer Brother    Cervical cancer Daughter    Leukemia Daughter    Leukemia Son    Throat cancer Brother    Cancer Brother        soft palette   Heart Problems Brother        Pacemaker   Colon cancer Neg Hx    Pancreatic cancer Neg Hx    Scheduled Meds:  budeson-glycopyrrolate-formoterol  2 puff Inhalation BID   digoxin   0.125 mg Oral Once per day on Sunday Wednesday   diltiazem   120 mg Oral Daily   fluticasone   1 spray Each Nare Daily   PARoxetine   20 mg Oral QHS   Continuous Infusions: PRN Meds:.acetaminophen , ondansetron  **OR** ondansetron  (ZOFRAN ) IV, mouth rinse Allergies  Allergen Reactions   Penicillins Other (See Comments)    Reaction:  Unknown  Has patient had a PCN reaction causing immediate rash, facial/tongue/throat swelling, SOB or lightheadedness with hypotension: Unsure Has  patient had a PCN reaction causing severe rash involving mucus membranes or skin necrosis: Unsure Has patient had a PCN reaction that required hospitalization Unsure Has patient had a PCN reaction occurring within the last 10 years: Unsure If all of the above answers are "NO", then may proceed with Cephalosporin use.    Antihistamines, Chlorpheniramine-Type Other (See Comments)    Due to afib - contraindication   Aspirin Other (See Comments)    Contraindication - due to afib   Codeine Rash   Hydrochlorothiazide Rash   Sulfa Antibiotics Rash   CBC:    Component Value Date/Time   WBC 4.8 12/17/2023 0439   HGB 8.4 (L) 12/17/2023 0439   HGB 10.5 (L) 11/12/2023 1316   HGB 11.7 04/10/2018 1310   HGB 8.3 (L) 12/30/2016 0850   HCT 28.0 (L) 12/17/2023 0439   HCT 37.1 04/10/2018 1310   HCT 26.2 (L) 12/30/2016 0850   PLT 80 (L) 12/17/2023 0439   PLT 101 (L) 11/12/2023 1316   PLT 146 (L) 04/10/2018 1310   MCV 83.8 12/17/2023 0439   MCV 88 04/10/2018 1310   MCV 86.0 12/30/2016 0850   NEUTROABS 3.4 12/17/2023 0439   NEUTROABS 3.1 04/10/2018 1310   NEUTROABS 4.4 12/30/2016 0850   LYMPHSABS 0.8 12/17/2023 0439   LYMPHSABS 1.5 04/10/2018 1310   LYMPHSABS 0.9 12/30/2016 0850   MONOABS 0.4 12/17/2023 0439   MONOABS 0.5 12/30/2016 0850   EOSABS 0.2 12/17/2023 0439   EOSABS 0.3 04/10/2018 1310   BASOSABS 0.0 12/17/2023 0439   BASOSABS 0.0 04/10/2018 1310   BASOSABS 0.0 12/30/2016 0850   Comprehensive Metabolic Panel:    Component Value Date/Time   NA 140 12/17/2023 1702   NA 142 05/07/2022 1010   NA 144 12/30/2016 0850   K 3.8 12/17/2023 1702   K 4.1 12/30/2016 0850   CL 98 12/17/2023 1702   CO2 31 12/17/2023 1702   CO2 33 (H) 12/30/2016 0850   BUN 15 12/17/2023 1702   BUN 18 05/07/2022 1010   BUN 27.8 (H) 12/30/2016 0850   CREATININE 1.08 (H) 12/17/2023 1702  CREATININE 1.15 (H) 11/12/2023 1316   CREATININE 1.0 12/30/2016 0850   GLUCOSE 99 12/17/2023 1702   GLUCOSE 99  12/30/2016 0850   CALCIUM  9.0 12/17/2023 1702   CALCIUM  9.7 12/30/2016 0850   AST 18 12/17/2023 1702   AST 14 (L) 11/12/2023 1316   AST 17 12/30/2016 0850   ALT 11 12/17/2023 1702   ALT 6 11/12/2023 1316   ALT 10 12/30/2016 0850   ALKPHOS 74 12/17/2023 1702   ALKPHOS 46 12/30/2016 0850   BILITOT 0.6 12/17/2023 1702   BILITOT 0.5 11/12/2023 1316   BILITOT 0.32 12/30/2016 0850   PROT 6.6 12/17/2023 1702   PROT 6.2 (L) 12/30/2016 0850   ALBUMIN  3.7 12/17/2023 1702   ALBUMIN  3.2 (L) 12/30/2016 0850    Physical Exam: Vital Signs: BP 139/75 (BP Location: Left Arm)   Pulse 89   Temp 98.3 F (36.8 C) (Oral)   Resp 18   Ht 5\' 4"  (1.626 m)   Wt 92.5 kg   SpO2 94%   BMI 35.02 kg/m  SpO2: SpO2: 94 % O2 Device: O2 Device: Room Air O2 Flow Rate: O2 Flow Rate (L/min): 2 L/min Intake/output summary:  Intake/Output Summary (Last 24 hours) at 12/18/2023 4098 Last data filed at 12/18/2023 0600 Gross per 24 hour  Intake --  Output 800 ml  Net -800 ml   LBM: Last BM Date : 12/17/23 Baseline Weight: Weight: 92.5 kg Most recent weight: Weight: 92.5 kg  General: NAD, alert, very hard of hearing, pleasant Cardiovascular: RRR Respiratory: no increased work of breathing noted, not in respiratory distress Neuro: Awake, interactive Psych: appropriately answers all questions          Palliative Performance Scale: 40%               Additional Data Reviewed: Recent Labs    12/16/23 0450 12/17/23 0439 12/17/23 1702  WBC 5.7 4.8  --   HGB 9.3* 8.4*  --   PLT 84* 80*  --   NA 140 137 140  BUN 17 11 15   CREATININE 0.97 1.04* 1.08*    Imaging: CT Head Wo Contrast CLINICAL DATA:  Mental status change, unknown cause.  EXAM: CT HEAD WITHOUT CONTRAST  TECHNIQUE: Contiguous axial images were obtained from the base of the skull through the vertex without intravenous contrast.  RADIATION DOSE REDUCTION: This exam was performed according to the departmental dose-optimization program  which includes automated exposure control, adjustment of the mA and/or kV according to patient size and/or use of iterative reconstruction technique.  COMPARISON:  CT scan head from 05/26/2023.  FINDINGS: Brain: No evidence of acute infarction, hemorrhage, hydrocephalus, extra-axial collection or mass lesion/mass effect.  There is bilateral periventricular hypodensity, which is non-specific but most likely seen in the settings of microvascular ischemic changes. Moderate in extent. Otherwise normal appearance of brain parenchyma.  Ventricles are normal. Cerebral volume is age appropriate.  Vascular: No hyperdense vessel or unexpected calcification. Intracranial arteriosclerosis.  Skull: Normal. Negative for fracture or focal lesion.  Sinuses/Orbits: No acute finding.  Other: Visualized mastoid air cells are unremarkable. No mastoid effusion.  IMPRESSION: *No acute intracranial abnormality.  Electronically Signed   By: Beula Brunswick M.D.   On: 12/15/2023 16:26 DG Chest 2 View CLINICAL DATA:  Altered mental status.  EXAM: CHEST - 2 VIEW  COMPARISON:  11/12/2022.  FINDINGS: Redemonstration of linear area of scarring/atelectasis overlying the right mid lung zone, with adjacent surgical suture, likely from prior wedge resection. Bilateral lung fields are otherwise  clear. No dense consolidation or lung collapse. Bilateral costophrenic angles are clear.  Stable cardio-mediastinal silhouette.  There is a left sided 2-lead pacemaker.  No acute osseous abnormalities.  The soft tissues are within normal limits.  IMPRESSION: No active cardiopulmonary disease.  Electronically Signed   By: Beula Brunswick M.D.   On: 12/15/2023 16:23    I personally reviewed recent imaging.   Palliative Care Assessment and Plan Summary of Established Goals of Care and Medical Treatment Preferences   Patient is a 15 F with a past medical history of A-fib, anxiety, SCC of the anus  status post colostomy, hyperlipidemia, and lung cancer who was admitted on 12/15/2023 for altered mental status.  There was concern at that time for UTI despite chronic suppression therapy with Macrobid .  Upon admission, because of acute metabolic encephalopathy unclear though thought to be multifactorial in setting of multiple underlying medical illnesses and polypharmacy.  Palliative medicine consulted to assist with complex medical decision making.  # Complex medical decision making/goals of care Patient initially admitted for severe change in mental status.  - Discussed care with patient as detailed above in HPI.  No visitors present at bedside today when visiting patient.  Patient notes that she is feeling much better at this time during hospitalization.  Patient motivated to participate with PT/OT to regain strength.  Patient agreeing with going to rehab.  Patient hoping to get stronger to enjoy more time with her family at home.  Patient agreeing with outpatient palliative medicine follow-up to continue discussions regarding medical care moving forward.  -  Code Status: Limited: Do not attempt resuscitation (DNR) -DNR-LIMITED -Do Not Intubate/DNI    # Psycho-social/Spiritual Support:  - Support System: Daughter, sons  # Discharge Planning:  Skilled Nursing Facility for rehab with Palliative care service follow-up - Placed [facility] consult for outpatient palliative medicine referral.  Thank you for allowing the palliative care team to participate in the care Joel Murphy.  Barnett Libel, DO Palliative Care Provider PMT # [phone]  If patient remains symptomatic despite maximum doses, please call PMT at [phone] between 0700 and 1900. Outside of these hours, please call attending, as PMT does not have night coverage.  Personally spent 60 minutes in patient care including extensive chart review (labs, imaging, progress/consult notes, vital signs), medically appropraite exam, discussed with  treatment team, education to patient, family, and staff, documenting clinical information, medication review and management, coordination of care, and available advanced directive documents.

## 2023-12-18 NOTE — Progress Notes (Signed)
 Pt refused morning labs. Provider notified.

## 2023-12-18 NOTE — TOC Progression Note (Addendum)
 Transition of Care [facility]) - Progression Note    Patient Details  Name: Tricia Hernandez MRN: [id number] Date of Birth: Jun 03, 1931  Transition of [facility]) CM/SW Contact  Kathryn Parish, RN Phone Number: 12/18/2023, 9:17 AM  Clinical Narrative:    Met with patient to discuss PT's SNF recommendation. Dr Murrel Arnt notes states is agreeable to [facility] [place] , [place]) SNF . Will faxed out. FL2;H7P, Progress Note; PT & OT notes.  Initial referral faxed out for review to [facility]. TOC awaiting bed offers.  11:00 AM [facility]) called to say they will accept patient. NCM informed Skylar that patient scheduled be discharged tomorrow(12/19/23). 11:58 AM NCM called Sue(POA) discussed new consult for outpatient palliative medicine referral. [name] would like referral for [facility]. Also made [name] aware that [facility] has accepted her mother. 3:45 PM [facility] can't accept referral due to providers do not cover [place]  area. NCM spoke with [name] [facility]) to other additional referrals. [name] has chose [facility] in [place], [place]. NCM will fax referral.  Expected Discharge Plan: Home/Self Care Barriers to Discharge: Continued Medical Work up  Expected Discharge Plan and Services   Discharge Planning Services: CM Consult Post Acute Care Choice: Durable Medical Equipment ([facility] for O2) Living arrangements for the past 2 months: Single Family Home                 DME Arranged: N/A DME Agency: NA       HH Arranged: NA HH Agency: NA         Social Determinants of Health (SDOH) Interventions SDOH Screenings   Food Insecurity: No Food Insecurity (12/16/2023)  Housing: Low Risk  (12/16/2023)  Transportation Needs: No Transportation Needs (12/16/2023)  Utilities: Not At Risk (12/16/2023)  Alcohol Screen: Low Risk  (02/26/2022)  Depression (PHQ2-9): Low Risk  (09/01/2023)  Financial Resource Strain: Low Risk  (02/26/2022)  Physical Activity: Inactive (02/26/2022)  Social  Connections: Unknown (12/16/2023)  Stress: No Stress Concern Present (02/26/2022)  Tobacco Use: Medium Risk (12/15/2023)    Readmission Risk Interventions    12/16/2023    4:35 PM  Readmission Risk Prevention Plan  Post Dischage Appt Complete  Medication Screening Complete  Transportation Screening Complete

## 2023-12-19 ENCOUNTER — Other Ambulatory Visit: Payer: Self-pay | Admitting: Physician Assistant

## 2023-12-19 ENCOUNTER — Other Ambulatory Visit: Payer: Self-pay | Admitting: Pharmacy Technician

## 2023-12-19 ENCOUNTER — Other Ambulatory Visit: Payer: Self-pay

## 2023-12-19 DIAGNOSIS — C3400 Malignant neoplasm of unspecified main bronchus: Secondary | ICD-10-CM

## 2023-12-19 DIAGNOSIS — G9341 Metabolic encephalopathy: Secondary | ICD-10-CM | POA: Diagnosis not present

## 2023-12-19 DIAGNOSIS — R8290 Unspecified abnormal findings in urine: Secondary | ICD-10-CM

## 2023-12-19 MED ORDER — PAROXETINE HCL 20 MG PO TABS: 20.0000 mg | ORAL_TABLET | Freq: Every day | ORAL | Status: AC

## 2023-12-19 MED ORDER — ACETAMINOPHEN 325 MG PO TABS: 650.0000 mg | ORAL_TABLET | Freq: Four times a day (QID) | ORAL | Status: AC | PRN

## 2023-12-19 NOTE — TOC Progression Note (Addendum)
 Transition of Care [facility]) - Progression Note    Patient Details  Name: Tricia Hernandez MRN: [id number] Date of Birth: [birth year]  [facility] CM/SW Contact  Kathryn Parish, RN Phone Number: 12/19/2023, 9:47 AM  Clinical Narrative:     NCM spoke with [facility] to initial the referral. Will fax information shortly. 9:58 AM Information faxed to [facility] 10:23 AM NCM called [facility] to inform patient is discharge today and request report  and room number.  Expected Discharge Plan: Home/Self Care Barriers to Discharge: Continued Medical Work up  Expected Discharge Plan and Services   Discharge Planning Services: CM Consult Post Acute Care Choice: Durable Medical Equipment ([facility] for O2) Living arrangements for the past 2 months: Single Family Home Expected Discharge Date: 12/19/23               DME Arranged: N/A DME Agency: NA       HH Arranged: NA HH Agency: NA         Social Determinants of Health (SDOH) Interventions SDOH Screenings   Food Insecurity: No Food Insecurity (12/16/2023)  Housing: Low Risk  (12/16/2023)  Transportation Needs: No Transportation Needs (12/16/2023)  Utilities: Not At Risk (12/16/2023)  Alcohol Screen: Low Risk  (02/26/2022)  Depression (PHQ2-9): Low Risk  (09/01/2023)  Financial Resource Strain: Low Risk  (02/26/2022)  Physical Activity: Inactive (02/26/2022)  Social Connections: Unknown (12/16/2023)  Stress: No Stress Concern Present (02/26/2022)  Tobacco Use: Medium Risk (12/15/2023)    Readmission Risk Interventions    12/16/2023    4:35 PM  Readmission Risk Prevention Plan  Post Dischage Appt Complete  Medication Screening Complete  Transportation Screening Complete

## 2023-12-19 NOTE — Discharge Summary (Signed)
 Physician Discharge Summary   Patient: Tricia Hernandez MRN: [id number] DOB: 09-Feb-1931  Admit date:     12/15/2023  Discharge date: 12/19/23  Discharge Physician: Lorita Rosa   PCP: [name], FNP   Recommendations at discharge:   You are being discharged to [facility] for rehabilitation Palliative care outpatient should be requested based on family's request Follow up with NP [facility] after discharge from rehab  Discharge Diagnoses: Principal Problem:   Acute metabolic encephalopathy Active Problems:   Obesity   Lung cancer (HCC)   Anal cancer (HCC)   Atrial fibrillation (HCC), CHA2DS2-VASc Score 5   Essential hypertension   Mixed hyperlipidemia   COPD with chronic bronchitis and emphysema (HCC)   Need for emotional support   Counseling and coordination of care   Goals of care, counseling/discussion   Hospital Course: 89yo with h/o afib, anxiety d/o, SCC of the anus s/p colostomy, HLD, and lung cancer who presented on 5/5 with AMS.  Concern for UTI despite chronic suppression therapy with Macrobid  but UA was clear.  Suspected to be due to polypharmacy but brain mets are also a consideration.  She cannot have MRI due to her pacemaker.  She has improved spontaneously and is medically ready for dc to SNF rehab.  Assessment and Plan:  Acute metabolic encephalopathy Cause is unclear Concern for UTI but patient has been treated adequately and UA is essentially normal at this time so not treating at this time She has h/o stage 4 lung cancer Negative head CT  She doesn't have obvious medications of concern Normal NH4 level Appears significantly improved today, ?cause Possible underlying cognitive impairment with delirium Family is open to palliative care, consult requested and she will want outpatient palliative care on an ongoing basis However, she is NOT enrolled in hospice (this was an error) and they do want her to go to SNF rehab ([facility])  and can transition to hospice if this is an ongoing issue She continues to be stable and will dc to [facility] on 5/9   History of lung cancer Stage 4 She is on osimertinib , held as inpatient Given her metastatic disease, MRI brain ordered to see if she is having letpomeningeal spread or other mets not seen on head CT  Unfortunately, her pacemaker is not MRI compatible and so this cannot be done   Atrial fibrillation Rate controlled on digoxin , diltiazem  Not on chronic anticoagulation   Mixed hyperlipidemia Hold statin for now Long-term use should be discussed on an ongoing basis, as it probably is not significant efficacious in her circumstance   COPD No acute exacerbation Continue Trelegy (Breztri  per formulary)   History of pulmonary embolism Patient is off anticoagulation due to risk of bleeding   Stage 3a CKD Appears to be stable at this time Attempt to avoid nephrotoxic medications   H/o anal cancer S/p colostomy No acute concerns at this time   Class 2 Obesity Body mass index is 35.02 kg/m.Aaron Aas  Weight loss should be encouraged Outpatient PCP/bariatric medicine f/u encouraged Significantly low or high BMI is associated with higher medical risk including morbidity and mortality    DNR  I have discussed code status with the family and they are in agreement that the patient would not desire resuscitation and would prefer to die a natural death should that situation arise. [facility] documents reviewed Patient will need a gold out of facility DNR form at the time of discharge  Consultants:   PT OT TOC team   Procedures: None   Antibiotics: None   Pain control - [place]  Controlled Substance Reporting System database was reviewed. and patient was instructed, not to drive, operate heavy machinery, perform activities at heights, swimming or participation in water activities or provide baby-sitting services while on Pain, Sleep and Anxiety  Medications; until their outpatient Physician has advised to do so again. Also recommended to not to take more than prescribed Pain, Sleep and Anxiety Medications.   Disposition: Skilled nursing facility Diet recommendation:  Regular diet DISCHARGE MEDICATION: Allergies as of 12/19/2023       Reactions   Penicillins Other (See Comments)   Reaction:  Unknown  Has patient had a PCN reaction causing immediate rash, facial/tongue/throat swelling, SOB or lightheadedness with hypotension: Unsure Has patient had a PCN reaction causing severe rash involving mucus membranes or skin necrosis: Unsure Has patient had a PCN reaction that required hospitalization Unsure Has patient had a PCN reaction occurring within the last 10 years: Unsure If all of the above answers are "NO", then may proceed with Cephalosporin use.   Antihistamines, Chlorpheniramine-type Other (See Comments)   Due to afib - contraindication   Aspirin Other (See Comments)   Contraindication - due to afib   Codeine Rash   Hydrochlorothiazide Rash   Sulfa Antibiotics Rash        Medication List     STOP taking these medications    cephALEXin 500 MG capsule Commonly known as: KEFLEX       TAKE these medications    acetaminophen  325 MG tablet Commonly known as: TYLENOL  Take 2 tablets (650 mg total) by mouth every 6 (six) hours as needed for mild pain (pain score 1-3), moderate pain (pain score 4-6) or headache.   anti-nausea solution Take 10 mLs by mouth every 15 (fifteen) minutes as needed for nausea or vomiting.   atorvastatin  20 MG tablet Commonly known as: LIPITOR Take 1 tablet (20 mg total) by mouth at bedtime.   digoxin  0.125 MG tablet Commonly known as: LANOXIN  Take 0.125 mg by mouth See admin instructions. Take 1 Tablet every day except for Wednesdays and Sundays   diltiazem  120 MG 24 hr capsule Commonly known as: CARDIZEM  CD Take 1 capsule (120 mg total) by mouth daily. What changed: when to take  this   ferrous sulfate  325 (65 FE) MG EC tablet Take 325 mg by mouth daily with breakfast.   fexofenadine  180 MG tablet Commonly known as: Allergy Relief Take 1 tablet (180 mg total) by mouth daily. What changed: when to take this   fluticasone  50 MCG/ACT nasal spray Commonly known as: FLONASE  Place 1 spray into both nostrils daily.   furosemide  20 MG tablet Commonly known as: LASIX  Take 1 tablet (20 mg total) by mouth daily. What changed:  when to take this reasons to take this   nitrofurantoin  (macrocrystal-monohydrate) 100 MG capsule Commonly known as: Macrobid  Take 1 capsule (100 mg total) by mouth 2 (two) times daily. What changed: when to take this   PARoxetine  20 MG tablet Commonly known as: PAXIL  Take 1 tablet (20 mg total) by mouth at bedtime.   PreserVision AREDS 2 Caps Take 1 capsule by mouth in the morning and at bedtime.   Tagrisso  40 MG tablet Generic drug: osimertinib  mesylate Take 1 tablet (40 mg total) by mouth daily.   Trelegy Ellipta  200-62.5-25 MCG/ACT Aepb Generic drug: Fluticasone -Umeclidin-Vilant Inhale 1 puff into the lungs daily.   Vitamin D  (  Ergocalciferol ) 1.25 MG (50000 UNIT) Caps capsule Commonly known as: DRISDOL  TAKE 1 CAPSULE BY MOUTH 1 TIME A WEEK What changed:  how much to take how to take this when to take this additional instructions        Discharge Exam:   Subjective: Feeling ok.  No specific concerns. Eager to discharge. Did not eat particularly well at breakfast but reports being full.   Objective: Vitals:   12/18/23 2003 12/19/23 0438  BP: (!) 135/58 132/64  Pulse: 84 (!) 59  Resp: 20 20  Temp: 98.8 F (37.1 C) 98.6 F (37 C)  SpO2: 98% 94%    Intake/Output Summary (Last 24 hours) at 12/19/2023 0947 Last data filed at 12/19/2023 0456 Gross per 24 hour  Intake 480 ml  Output 1250 ml  Net -770 ml   Filed Weights   12/15/23 1354  Weight: 92.5 kg    Exam:  General:  Appears calm and comfortable and  is in NAD, conversant Eyes:   EOMI, normal lids, iris ENT:  grossly normal hearing, lips & tongue, mmm Cardiovascular:  RRR. No LE edema.  Respiratory:   CTA bilaterally with no wheezes/rales/rhonchi.  Normal respiratory effort. Abdomen:  soft, NT, ND; +ostomy with soft brown stool and gas Skin:  no rash or induration seen on limited exam Musculoskeletal:  grossly normal tone BUE/BLE, good ROM, no bony abnormality Psychiatric:  pleasant mood and affect, speech fluent and mostly appropriate, not frankly confused  Neurologic:  CN 2-12 grossly intact, moves all extremities in coordinated fashion  Data Reviewed: I have reviewed the patient's lab results since admission.  Pertinent labs for today include:   None today    Condition at discharge: stable  The results of significant diagnostics from this hospitalization (including imaging, microbiology, ancillary and laboratory) are listed below for reference.   Imaging Studies: CT Head Wo Contrast Result Date: 12/15/2023 CLINICAL DATA:  Mental status change, unknown cause. EXAM: CT HEAD WITHOUT CONTRAST TECHNIQUE: Contiguous axial images were obtained from the base of the skull through the vertex without intravenous contrast. RADIATION DOSE REDUCTION: This exam was performed according to the departmental dose-optimization program which includes automated exposure control, adjustment of the mA and/or kV according to patient size and/or use of iterative reconstruction technique. COMPARISON:  CT scan head from 05/26/2023. FINDINGS: Brain: No evidence of acute infarction, hemorrhage, hydrocephalus, extra-axial collection or mass lesion/mass effect. There is bilateral periventricular hypodensity, which is non-specific but most likely seen in the settings of microvascular ischemic changes. Moderate in extent. Otherwise normal appearance of brain parenchyma. Ventricles are normal. Cerebral volume is age appropriate. Vascular: No hyperdense vessel or  unexpected calcification. Intracranial arteriosclerosis. Skull: Normal. Negative for fracture or focal lesion. Sinuses/Orbits: No acute finding. Other: Visualized mastoid air cells are unremarkable. No mastoid effusion. IMPRESSION: *No acute intracranial abnormality. Electronically Signed   By: Beula Brunswick M.D.   On: 12/15/2023 16:26   DG Chest 2 View Result Date: 12/15/2023 CLINICAL DATA:  Altered mental status. EXAM: CHEST - 2 VIEW COMPARISON:  11/12/2022. FINDINGS: Redemonstration of linear area of scarring/atelectasis overlying the right mid lung zone, with adjacent surgical suture, likely from prior wedge resection. Bilateral lung fields are otherwise clear. No dense consolidation or lung collapse. Bilateral costophrenic angles are clear. Stable cardio-mediastinal silhouette. There is a left sided 2-lead pacemaker. No acute osseous abnormalities. The soft tissues are within normal limits. IMPRESSION: No active cardiopulmonary disease. Electronically Signed   By: Beula Brunswick M.D.   On: 12/15/2023 16:23  Microbiology: Results for orders placed or performed during the hospital encounter of 12/15/23  Urine Culture     Status: None   Collection Time: 12/15/23  7:26 PM   Specimen: Urine, Clean Catch  Result Value Ref Range Status   Specimen Description   Final    URINE, CLEAN CATCH Performed at [facility], [address] [place], [place] [address]    Special Requests   Final    NONE Performed at [facility], [address] [place], [place] [address]    Culture   Final    NO GROWTH Performed at [facility], [address], [place], [place] [address]    Report Status 12/16/2023 FINAL  Final    Labs: CBC: Recent Labs  Lab 12/15/23 1415 12/16/23 0450 12/17/23 0439 12/18/23 0729  WBC 6.7 5.7 4.8 5.8  NEUTROABS 5.1  --  3.4 4.5  HGB 9.8* 9.3* 8.4* 8.5*  HCT 33.4* 30.6* 28.0* 29.3*  MCV 85.0 84.8 83.8 85.7  PLT 90* 84* 80* 83*    Basic Metabolic Panel: Recent Labs  Lab 12/15/23 1415 12/16/23 0450 12/17/23 0439 12/17/23 1702  NA 139 140 137 140  K 4.0 3.6 3.7 3.8  CL 101 99 98 98  CO2 27 32 30 31  GLUCOSE 97 104* 98 99  BUN 22 17 11 15   CREATININE 1.07* 0.97 1.04* 1.08*  CALCIUM  8.7* 8.6* 8.2* 9.0   Liver Function Tests: Recent Labs  Lab 12/15/23 1555 12/16/23 0450 12/17/23 1702  AST 18 15 18   ALT 13 12 11   ALKPHOS 71 68 74  BILITOT 0.5 0.9 0.6  PROT 6.3* 6.2* 6.6  ALBUMIN  3.5 3.3* 3.7   CBG: No results for input(s): "GLUCAP" in the last 168 hours.  Discharge time spent: greater than 30 minutes.  Signed: Lorita Rosa, MD [facility] 12/19/2023

## 2023-12-19 NOTE — Progress Notes (Signed)
 RN spoke to daughter, Jared Merle, to let her know that patient was escorted via [facility] to [place] to arrive within 45 minutes. Patient's daughter verbalized understanding.

## 2023-12-19 NOTE — Progress Notes (Signed)
 RN spoke to Tricia Hernandez this morning and will follow up when patient in route to facility.

## 2023-12-19 NOTE — Progress Notes (Signed)
 Specialty Pharmacy Refill Coordination Note  Tricia Hernandez is a 89 y.o. female contacted today regarding refills of specialty medication(s) Osimertinib  Mesylate (TAGRISSO )   Patient requested (Patient-Rptd) Delivery   Delivery date: 12/25/23   Verified address: [address] [place] [address]   Medication will be filled on 12/24/23.   This fill date is pending response to refill request from provider. Patient is aware and if they have not received fill by intended date they must follow up with pharmacy.   Spoke to patient's daughter.

## 2023-12-20 MED ORDER — OSIMERTINIB MESYLATE 40 MG PO TABS
40.0000 mg | ORAL_TABLET | Freq: Every day | ORAL | 2 refills | Status: DC
  Filled 2023-12-22 (×2): qty 30, 30d supply, fill #0
  Filled 2024-01-12: qty 30, 30d supply, fill #1
  Filled 2024-02-10 – 2024-02-17 (×2): qty 30, 30d supply, fill #2

## 2023-12-22 ENCOUNTER — Other Ambulatory Visit: Payer: Self-pay

## 2023-12-22 ENCOUNTER — Inpatient Hospital Stay: Attending: Hematology and Oncology

## 2023-12-22 ENCOUNTER — Inpatient Hospital Stay (HOSPITAL_BASED_OUTPATIENT_CLINIC_OR_DEPARTMENT_OTHER): Admitting: Internal Medicine

## 2023-12-22 ENCOUNTER — Other Ambulatory Visit (HOSPITAL_COMMUNITY): Payer: Self-pay

## 2023-12-22 VITALS — BP 147/80 | HR 75 | Temp 98.4°F | Resp 16 | Ht 64.0 in | Wt 198.3 lb

## 2023-12-22 DIAGNOSIS — Z90710 Acquired absence of both cervix and uterus: Secondary | ICD-10-CM | POA: Insufficient documentation

## 2023-12-22 DIAGNOSIS — Z85048 Personal history of other malignant neoplasm of rectum, rectosigmoid junction, and anus: Secondary | ICD-10-CM | POA: Insufficient documentation

## 2023-12-22 DIAGNOSIS — Z885 Allergy status to narcotic agent status: Secondary | ICD-10-CM | POA: Insufficient documentation

## 2023-12-22 DIAGNOSIS — J984 Other disorders of lung: Secondary | ICD-10-CM | POA: Insufficient documentation

## 2023-12-22 DIAGNOSIS — Z882 Allergy status to sulfonamides status: Secondary | ICD-10-CM | POA: Diagnosis not present

## 2023-12-22 DIAGNOSIS — Z923 Personal history of irradiation: Secondary | ICD-10-CM | POA: Diagnosis not present

## 2023-12-22 DIAGNOSIS — C3490 Malignant neoplasm of unspecified part of unspecified bronchus or lung: Secondary | ICD-10-CM

## 2023-12-22 DIAGNOSIS — Z880 Allergy status to penicillin: Secondary | ICD-10-CM | POA: Diagnosis not present

## 2023-12-22 DIAGNOSIS — Z86711 Personal history of pulmonary embolism: Secondary | ICD-10-CM | POA: Insufficient documentation

## 2023-12-22 DIAGNOSIS — Z9049 Acquired absence of other specified parts of digestive tract: Secondary | ICD-10-CM | POA: Diagnosis not present

## 2023-12-22 DIAGNOSIS — C342 Malignant neoplasm of middle lobe, bronchus or lung: Secondary | ICD-10-CM | POA: Insufficient documentation

## 2023-12-22 DIAGNOSIS — F419 Anxiety disorder, unspecified: Secondary | ICD-10-CM | POA: Diagnosis not present

## 2023-12-22 DIAGNOSIS — I480 Paroxysmal atrial fibrillation: Secondary | ICD-10-CM

## 2023-12-22 DIAGNOSIS — I10 Essential (primary) hypertension: Secondary | ICD-10-CM | POA: Insufficient documentation

## 2023-12-22 DIAGNOSIS — Z886 Allergy status to analgesic agent status: Secondary | ICD-10-CM | POA: Insufficient documentation

## 2023-12-22 DIAGNOSIS — I4891 Unspecified atrial fibrillation: Secondary | ICD-10-CM | POA: Insufficient documentation

## 2023-12-22 DIAGNOSIS — Z79899 Other long term (current) drug therapy: Secondary | ICD-10-CM | POA: Diagnosis not present

## 2023-12-22 DIAGNOSIS — C3411 Malignant neoplasm of upper lobe, right bronchus or lung: Secondary | ICD-10-CM

## 2023-12-22 LAB — CBC WITH DIFFERENTIAL (CANCER CENTER ONLY)
Abs Immature Granulocytes: 0.01 10*3/uL (ref 0.00–0.07)
Basophils Absolute: 0 10*3/uL (ref 0.0–0.1)
Basophils Relative: 0 %
Eosinophils Absolute: 0.2 10*3/uL (ref 0.0–0.5)
Eosinophils Relative: 3 %
HCT: 32.6 % — ABNORMAL LOW (ref 36.0–46.0)
Hemoglobin: 10 g/dL — ABNORMAL LOW (ref 12.0–15.0)
Immature Granulocytes: 0 %
Lymphocytes Relative: 11 %
Lymphs Abs: 0.7 10*3/uL (ref 0.7–4.0)
MCH: 25.1 pg — ABNORMAL LOW (ref 26.0–34.0)
MCHC: 30.7 g/dL (ref 30.0–36.0)
MCV: 81.7 fL (ref 80.0–100.0)
Monocytes Absolute: 0.4 10*3/uL (ref 0.1–1.0)
Monocytes Relative: 6 %
Neutro Abs: 5.1 10*3/uL (ref 1.7–7.7)
Neutrophils Relative %: 80 %
Platelet Count: 105 10*3/uL — ABNORMAL LOW (ref 150–400)
RBC: 3.99 MIL/uL (ref 3.87–5.11)
RDW: 16.4 % — ABNORMAL HIGH (ref 11.5–15.5)
WBC Count: 6.4 10*3/uL (ref 4.0–10.5)
nRBC: 0 % (ref 0.0–0.2)

## 2023-12-22 LAB — CMP (CANCER CENTER ONLY)
ALT: 8 U/L (ref 0–44)
AST: 14 U/L — ABNORMAL LOW (ref 15–41)
Albumin: 3.9 g/dL (ref 3.5–5.0)
Alkaline Phosphatase: 88 U/L (ref 38–126)
Anion gap: 7 (ref 5–15)
BUN: 12 mg/dL (ref 8–23)
CO2: 35 mmol/L — ABNORMAL HIGH (ref 22–32)
Calcium: 8.9 mg/dL (ref 8.9–10.3)
Chloride: 101 mmol/L (ref 98–111)
Creatinine: 1.08 mg/dL — ABNORMAL HIGH (ref 0.44–1.00)
GFR, Estimated: 49 mL/min — ABNORMAL LOW (ref >60)
Glucose, Bld: 110 mg/dL — ABNORMAL HIGH (ref 70–99)
Potassium: 3.6 mmol/L (ref 3.5–5.1)
Sodium: 143 mmol/L (ref 135–145)
Total Bilirubin: 0.5 mg/dL (ref 0.0–1.2)
Total Protein: 6.6 g/dL (ref 6.5–8.1)

## 2023-12-22 LAB — DIGOXIN LEVEL: Digoxin Level: 0.7 ng/mL — ABNORMAL LOW (ref 0.8–2.0)

## 2023-12-22 NOTE — Progress Notes (Signed)
 [facility] [phone]   [phone]  OFFICE PROGRESS NOTE  [name], FNP [address] [place] [address]  DIAGNOSIS: Recurrent and metastatic Non-small cell lung cancer, adenocarcinoma.  She was initially diagnosed in 2013 status post right middle lobe resection, she was found to have recurrent disease in 2024 with extensive pleural nodularity in the right hemithorax without any nodal or pulmonary parenchymal involvement.  There is no evidence of extrathoracic metastatic disease.   Biomarker Findings HRD signature - HRDsig Negative Microsatellite status - MS-Stable Tumor Mutational Burden - 4 Muts/Mb Genomic Findings For a complete list of the genes assayed, please refer to the Appendix. EGFR L858R, T790M PBRM1 [id number]*49 RB1 R455* TP53 C141W 7 Disease relevant genes with no reportable alterations: ALK, BRAF, ERBB2, KRAS, MET, RET, ROS1 PD-L1 expression 55% PRIOR THERAPY: anal cancer treated with radiation and surgery in 2017    CURRENT THERAPY: Tagrisso  80 mg p.o. daily, first dose June 27, 2023.  This was reduced to Tagrisso  40 mg p.o. daily in March 2025.  INTERVAL HISTORY: Tricia Hernandez 89 y.o. female returns to the clinic today for follow-up visit accompanied by her daughter.Discussed the use of AI scribe software for clinical note transcription with the patient, who gave verbal consent to proceed.  History of Present Illness   Tricia Hernandez is a 89 year old female with recurrent and metastatic non-small cell lung cancer who presents for evaluation and repeat blood work.  She has a history of recurrent and metastatic non-small cell lung cancer, adenocarcinoma, initially diagnosed in 2013. Disease recurrence with extensive pleural nodularity in the right hemisphere was noted in October 2024. Molecular studies revealed positive EGFR mutation L858R and T790M mutation. She began treatment with  Tagrisso  at 80 mg daily in November 2024, which was reduced to 40 mg daily in March 2025 due to intolerance. Recently, during a hospitalization, Tagrisso  was held due to her being too sick, but it was resumed four to five days ago.  She was hospitalized recently due to confusion, disorientation, and delusional behavior despite being on daily antibiotics. She received IV antibiotics during her hospital stay and was discharged with oral antibiotics, specifically microbed 100 mg twice a day. Her condition improved after the hospital stay, but there are concerns about her mental status regressing slightly.  Her recent blood work shows improvement with hemoglobin increasing from 8.5 to 10 and platelets from 80 to 105. Her white blood count is also stable. She has been experiencing right side pain, which was noted when Tagrisso  was withheld. Her last chest scan was in March 2025, and she had a head scan during her recent hospitalization, which was normal.  She resides in a rehab facility close to her home in [place] , and her family, including her two sons, are involved in her care. She previously lived independently with support from her family.       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 5/2 EGD: grade A reflux esophagitis.  Mild, non-hemorrhagic gastritis.  Ablation of 3 Duodenal AVMs   Herpes zoster    History of blood transfusion    for vaginal, uterine bleeding leading to hysterectomy.  in 12/2016  transfused x 1 for GI bleed.    History of hiatal hernia    Hyperlipidemia    Hypertension    Labyrinthitis    Lung cancer (HCC)    RML   Obesity    On home oxygen  therapy    "3L just at night when I sleep" (04/20/2018)   Paroxysmal supraventricular tachycardia (HCC)    Peptic ulcer of stomach    hx   Pneumonia 1980s X 1; 08/2016   "walking; double"   Presence of permanent cardiac pacemaker 04/20/2018   Pulmonary embolism (HCC) 05/2014   "left lung"   Seasonal allergies    "bad" (04/20/2018)   Vitamin D  deficiency     ALLERGIES:  is allergic to penicillins; antihistamines, chlorpheniramine-type; aspirin; codeine; hydrochlorothiazide; and sulfa antibiotics.  MEDICATIONS:  Current Outpatient Medications  Medication Sig Dispense Refill   acetaminophen  (TYLENOL ) 325 MG tablet Take 2 tablets (650 mg total) by mouth every 6 (six) hours as needed for mild pain (pain score 1-3), moderate pain (pain score 4-6) or headache.     anti-nausea (EMETROL) solution Take 10 mLs by mouth every 15 (fifteen) minutes as needed for nausea or vomiting.     atorvastatin  (LIPITOR) 20 MG tablet Take 1 tablet (20 mg total) by mouth at bedtime. 90 tablet 3   digoxin  (LANOXIN ) 0.125 MG tablet Take 0.125 mg by mouth See admin instructions. Take 1 Tablet every day except for Wednesdays and Sundays     diltiazem  (CARDIZEM  CD) 120 MG 24 hr capsule Take 1 capsule (120 mg total) by mouth daily. (Patient taking differently: Take 120 mg by mouth at bedtime.) 90 capsule 2   ferrous sulfate  325 (65 FE) MG EC tablet Take 325 mg by mouth daily with breakfast.     fexofenadine  (ALLERGY RELIEF) 180 MG tablet Take 1 tablet (180 mg total) by mouth daily. (Patient taking differently: Take 180 mg by mouth at bedtime.) 90 tablet 3   fluticasone  (FLONASE ) 50 MCG/ACT nasal spray Place 1 spray into both nostrils daily.     Fluticasone -Umeclidin-Vilant (TRELEGY ELLIPTA ) 200-62.5-25 MCG/ACT AEPB Inhale 1 puff into the lungs daily. 180  each 3   furosemide  (LASIX ) 20 MG tablet Take 1 tablet (20 mg total) by mouth daily. (Patient taking differently: Take 20 mg by mouth daily as needed for edema or fluid.) 90 tablet 3   Multiple Vitamins-Minerals (PRESERVISION AREDS 2) CAPS Take 1 capsule by mouth in the morning and at bedtime.     nitrofurantoin , macrocrystal-monohydrate, (MACROBID ) 100 MG capsule Take 1 capsule (100 mg total) by mouth 2 (two) times daily. (Patient taking differently: Take 100 mg by mouth daily at 12 noon.) 14 capsule 0   osimertinib  mesylate (TAGRISSO ) 40 MG tablet Take 1 tablet (40 mg total) by mouth daily. 30 tablet 2   PARoxetine  (PAXIL ) 20 MG tablet Take 1 tablet (20 mg total) by mouth at bedtime.     Vitamin D , Ergocalciferol , (DRISDOL ) 1.25 MG (50000 UNIT) CAPS capsule TAKE 1 CAPSULE BY MOUTH 1 TIME A WEEK (Patient taking differently: Take 50,000 Units by mouth every 7 (seven) days. Monday) 12 capsule 1   No current  facility-administered medications for this visit.   Facility-Administered Medications Ordered in Other Visits  Medication Dose Route Frequency Provider Last Rate Last Admin   sodium phosphate  (FLEET) 7-19 GM/118ML enema 2 enema  2 enema Rectal Once Melvenia Stabs, MD        SURGICAL HISTORY:  Past Surgical History:  Procedure Laterality Date   APPENDECTOMY  1959   BASAL CELL CARCINOMA EXCISION     nose x 2   BREAST CYST ASPIRATION Right 2016?   CATARACT EXTRACTION W/ INTRAOCULAR LENS  IMPLANT, BILATERAL Bilateral 1990s   CHOLECYSTECTOMY OPEN  1980s   COLON SURGERY     COLOSTOMY N/A 07/29/2016   Procedure: COLOSTOMY;  Surgeon: Adalberto Acton, MD;  Location: [facility] OR;  Service: General;  Laterality: N/A;   DILATION AND CURETTAGE OF UTERUS     ESOPHAGOGASTRODUODENOSCOPY N/A 12/11/2016   Procedure: ESOPHAGOGASTRODUODENOSCOPY (EGD);  Surgeon: Nannette Babe, MD;  Location: [facility];  Service: Endoscopy;  Laterality: N/A;   EVALUATION UNDER ANESTHESIA WITH HEMORRHOIDECTOMY AND  PROCTOSCOPY N/A 07/25/2016   Procedure: EXAM UNDER ANESTHESIA, EXCISION PERIANAL MASS.;  Surgeon: Jerryl Morin, MD;  Location: [facility] OR;  Service: General;  Laterality: N/A;  Prone position   FLEXIBLE SIGMOIDOSCOPY N/A 06/16/2017   Procedure: FLEXIBLE SIGMOIDOSCOPY EXAM UNDER ANESHESIA;  Surgeon: Melvenia Stabs, MD;  Location: [facility];  Service: General;  Laterality: N/A;   INSERT / REPLACE / REMOVE PACEMAKER  04/20/2018   IR RADIOLOGIST EVAL & MGMT  03/25/2017   LAPAROSCOPIC DIVERTED COLOSTOMY N/A 07/29/2016   Procedure: ATTEMPTED LAPAROSCOPIC ASSISTED COLOSTOMY;  Surgeon: Adalberto Acton, MD;  Location: [facility] OR;  Service: General;  Laterality: N/A;   LUNG REMOVAL, PARTIAL Right 2013   RML mass/carcinoid, Dr Alonna James   PACEMAKER IMPLANT N/A 04/20/2018   Procedure: PACEMAKER IMPLANT;  Surgeon: Tammie Fall, MD;  Location: [facility] INVASIVE CV LAB;  Service: Cardiovascular;  Laterality: N/A;   SQUAMOUS CELL CARCINOMA EXCISION     rectum, right leg   TONSILLECTOMY AND ADENOIDECTOMY  1960s   VAGINAL HYSTERECTOMY  1959   "partial"    REVIEW OF SYSTEMS:  Constitutional: positive for fatigue and weight loss Eyes: negative Ears, nose, mouth, throat, and face: negative Respiratory: positive for dyspnea on exertion and pleurisy/chest pain Cardiovascular: negative Gastrointestinal: negative Genitourinary:negative Integument/breast: negative Hematologic/lymphatic: negative Musculoskeletal:positive for arthralgias and muscle weakness Neurological: negative Behavioral/Psych: negative Endocrine: negative Allergic/Immunologic: negative   PHYSICAL EXAMINATION: General appearance: alert, cooperative, fatigued, and no distress Head: Normocephalic, without obvious abnormality, atraumatic Neck: no adenopathy, no JVD, supple, symmetrical, trachea midline, and thyroid  not enlarged, symmetric, no tenderness/mass/nodules Lymph nodes: Cervical, supraclavicular, and axillary nodes normal. Resp: clear to  auscultation bilaterally Back: symmetric, no curvature. ROM normal. No CVA tenderness. Cardio: regular rate and rhythm, S1, S2 normal, no murmur, click, rub or gallop GI: soft, non-tender; bowel sounds normal; no masses,  no organomegaly Extremities: extremities normal, atraumatic, no cyanosis or edema Neurologic: Alert and oriented X 3, normal strength and tone. Normal symmetric reflexes. Normal coordination and gait  ECOG PERFORMANCE STATUS: 1 - Symptomatic but completely ambulatory  Blood pressure (!) 147/80, pulse 75, temperature 98.4 F (36.9 C), temperature source Temporal, resp. rate 16, height 5\' 4"  (1.626 m), weight 198 lb 4.8 oz (89.9 kg), SpO2 92%.  LABORATORY DATA: Lab Results  Component Value Date   WBC 6.4 12/22/2023   HGB 10.0 (L) 12/22/2023   HCT 32.6 (L) 12/22/2023   MCV 81.7 12/22/2023   PLT 105 (L) 12/22/2023  Chemistry      Component Value Date/Time   NA 143 12/22/2023 1054   NA 142 05/07/2022 1010   NA 144 12/30/2016 0850   K 3.6 12/22/2023 1054   K 4.1 12/30/2016 0850   CL 101 12/22/2023 1054   CO2 35 (H) 12/22/2023 1054   CO2 33 (H) 12/30/2016 0850   BUN 12 12/22/2023 1054   BUN 18 05/07/2022 1010   BUN 27.8 (H) 12/30/2016 0850   CREATININE 1.08 (H) 12/22/2023 1054   CREATININE 1.0 12/30/2016 0850      Component Value Date/Time   CALCIUM  8.9 12/22/2023 1054   CALCIUM  9.7 12/30/2016 0850   ALKPHOS 88 12/22/2023 1054   ALKPHOS 46 12/30/2016 0850   AST 14 (L) 12/22/2023 1054   AST 17 12/30/2016 0850   ALT 8 12/22/2023 1054   ALT 10 12/30/2016 0850   BILITOT 0.5 12/22/2023 1054   BILITOT 0.32 12/30/2016 0850       RADIOGRAPHIC STUDIES: CT Head Wo Contrast Result Date: 12/15/2023 CLINICAL DATA:  Mental status change, unknown cause. EXAM: CT HEAD WITHOUT CONTRAST TECHNIQUE: Contiguous axial images were obtained from the base of the skull through the vertex without intravenous contrast. RADIATION DOSE REDUCTION: This exam was performed  according to the departmental dose-optimization program which includes automated exposure control, adjustment of the mA and/or kV according to patient size and/or use of iterative reconstruction technique. COMPARISON:  CT scan head from 05/26/2023. FINDINGS: Brain: No evidence of acute infarction, hemorrhage, hydrocephalus, extra-axial collection or mass lesion/mass effect. There is bilateral periventricular hypodensity, which is non-specific but most likely seen in the settings of microvascular ischemic changes. Moderate in extent. Otherwise normal appearance of brain parenchyma. Ventricles are normal. Cerebral volume is age appropriate. Vascular: No hyperdense vessel or unexpected calcification. Intracranial arteriosclerosis. Skull: Normal. Negative for fracture or focal lesion. Sinuses/Orbits: No acute finding. Other: Visualized mastoid air cells are unremarkable. No mastoid effusion. IMPRESSION: *No acute intracranial abnormality. Electronically Signed   By: Beula Brunswick M.D.   On: 12/15/2023 16:26   DG Chest 2 View Result Date: 12/15/2023 CLINICAL DATA:  Altered mental status. EXAM: CHEST - 2 VIEW COMPARISON:  11/12/2022. FINDINGS: Redemonstration of linear area of scarring/atelectasis overlying the right mid lung zone, with adjacent surgical suture, likely from prior wedge resection. Bilateral lung fields are otherwise clear. No dense consolidation or lung collapse. Bilateral costophrenic angles are clear. Stable cardio-mediastinal silhouette. There is a left sided 2-lead pacemaker. No acute osseous abnormalities. The soft tissues are within normal limits. IMPRESSION: No active cardiopulmonary disease. Electronically Signed   By: Beula Brunswick M.D.   On: 12/15/2023 16:23    ASSESSMENT AND PLAN: This is a very pleasant 89 years old white female diagnosed with Recurrent and metastatic Non-small cell lung cancer, adenocarcinoma.  She was initially diagnosed in 2013 status post right middle lobe resection,  she was found to have recurrent disease in 2024 with extensive pleural nodularity in the right hemithorax without any nodal or pulmonary parenchymal involvement.  There is no evidence of extrathoracic metastatic disease. Molecular studies showed positive EGFR mutation in exon 21 with L858R as well as T790M mutation.  PD-L1 expression was 55%. The patient started treatment with Tagrisso  80 mg p.o. daily on June 27, 2023.  She is currently on Tagrisso  40 mg p.o. daily since March 2025 secondary to intolerance of the higher dose. She is tolerating her treatment fairly well.     Metastatic non-small cell lung cancer Recurrent and metastatic non-small cell  lung cancer, adenocarcinoma, initially diagnosed in 2013. Disease recurrence with extensive pleural nodularity in the right hemisphere noted in October 2024. Positive EGFR mutation L858R and T790M mutation. Currently on Tagrisso  40 mg PO daily, reduced from 80 mg due to intolerance. Recent hospitalization led to temporary discontinuation of Tagrisso , but it has been resumed. Blood counts have improved since hospitalization. A chest CT is needed to assess response to treatment. - Continue Tagrisso  40 mg PO daily - Order chest CT in three weeks to assess response to treatment - Schedule follow-up appointment in four weeks  Infection requiring antibiotics Recent hospitalization for infection requiring IV antibiotics. Currently on oral antibiotics, Microbed 100 mg twice a day. Infection appears to be improving, but close monitoring is necessary. Hospitalization was necessary due to confusion and disorientation, which resolved with treatment. - Continue Microbed 100 mg twice a day  Weight loss Weight loss potentially related to infection and decreased oral intake. Weight fluctuated between 192 and 196 pounds, with recent weight at 196 pounds with clothing. Weight management depends on nutritional intake and infection control.   She was advised to call  immediately if she has any concerning symptoms in the interval. The patient voices understanding of current disease status and treatment options and is in agreement with the current care plan.  All questions were answered. The patient knows to call the clinic with any problems, questions or concerns. We can certainly see the patient much sooner if necessary.  The total time spent in the appointment was 30 minutes.  Disclaimer: This note was dictated with voice recognition software. Similar sounding words can inadvertently be transcribed and may not be corrected upon review.

## 2023-12-24 ENCOUNTER — Other Ambulatory Visit: Payer: Self-pay

## 2023-12-25 ENCOUNTER — Other Ambulatory Visit: Payer: Self-pay

## 2023-12-25 NOTE — Progress Notes (Signed)
 Clinical Intervention Note  Clinical Intervention Notes: Patient inititated treatment with Macrobid . No DDIs identified with Tagrisso .   Clinical Intervention Outcomes: Prevention of an adverse drug event   Rena Carnes Specialty Pharmacist

## 2023-12-31 ENCOUNTER — Telehealth: Payer: Self-pay | Admitting: Pharmacist

## 2023-12-31 NOTE — Telephone Encounter (Signed)
 Called and spoke with [name] (daughter). Patient has been hospitalized and now in rehab. Dig levels slightly low again. Daughter not sure if she is receiving her digoxin  while in rehab. Has follow up scheduled for next Wednesday.

## 2023-12-31 NOTE — Telephone Encounter (Signed)
 Spoke with patient;s daughter. Patient was hospitalized and now in rehab. She does not have her remote pacemaker device with her. Daughter would like to know if she should bring device to rehab for remote checks

## 2024-01-01 ENCOUNTER — Other Ambulatory Visit: Payer: Self-pay | Admitting: Physician Assistant

## 2024-01-01 DIAGNOSIS — I480 Paroxysmal atrial fibrillation: Secondary | ICD-10-CM

## 2024-01-01 NOTE — Telephone Encounter (Signed)
 Pt daughter states she will try to have her monitor at the rehab by June 3rd.

## 2024-01-07 ENCOUNTER — Ambulatory Visit (HOSPITAL_COMMUNITY)
Admission: RE | Admit: 2024-01-07 | Discharge: 2024-01-07 | Disposition: A | Discharge status: Home / Self Care | Source: Ambulatory Visit | Attending: Internal Medicine | Admitting: Internal Medicine

## 2024-01-07 ENCOUNTER — Inpatient Hospital Stay

## 2024-01-07 DIAGNOSIS — C3490 Malignant neoplasm of unspecified part of unspecified bronchus or lung: Secondary | ICD-10-CM

## 2024-01-07 DIAGNOSIS — I480 Paroxysmal atrial fibrillation: Secondary | ICD-10-CM

## 2024-01-07 DIAGNOSIS — C342 Malignant neoplasm of middle lobe, bronchus or lung: Secondary | ICD-10-CM | POA: Diagnosis not present

## 2024-01-07 LAB — CBC WITH DIFFERENTIAL (CANCER CENTER ONLY)
Abs Immature Granulocytes: 0.01 10*3/uL (ref 0.00–0.07)
Basophils Absolute: 0 10*3/uL (ref 0.0–0.1)
Basophils Relative: 0 %
Eosinophils Absolute: 0.2 10*3/uL (ref 0.0–0.5)
Eosinophils Relative: 3 %
HCT: 31 % — ABNORMAL LOW (ref 36.0–46.0)
Hemoglobin: 9.5 g/dL — ABNORMAL LOW (ref 12.0–15.0)
Immature Granulocytes: 0 %
Lymphocytes Relative: 16 %
Lymphs Abs: 1 10*3/uL (ref 0.7–4.0)
MCH: 24.9 pg — ABNORMAL LOW (ref 26.0–34.0)
MCHC: 30.6 g/dL (ref 30.0–36.0)
MCV: 81.2 fL (ref 80.0–100.0)
Monocytes Absolute: 0.4 10*3/uL (ref 0.1–1.0)
Monocytes Relative: 6 %
Neutro Abs: 4.5 10*3/uL (ref 1.7–7.7)
Neutrophils Relative %: 75 %
Platelet Count: 104 10*3/uL — ABNORMAL LOW (ref 150–400)
RBC: 3.82 MIL/uL — ABNORMAL LOW (ref 3.87–5.11)
RDW: 16.3 % — ABNORMAL HIGH (ref 11.5–15.5)
WBC Count: 6.1 10*3/uL (ref 4.0–10.5)
nRBC: 0 % (ref 0.0–0.2)

## 2024-01-07 LAB — CMP (CANCER CENTER ONLY)
ALT: 10 U/L (ref 0–44)
AST: 15 U/L (ref 15–41)
Albumin: 3.4 g/dL — ABNORMAL LOW (ref 3.5–5.0)
Alkaline Phosphatase: 89 U/L (ref 38–126)
Anion gap: 9 (ref 5–15)
BUN: 14 mg/dL (ref 8–23)
CO2: 34 mmol/L — ABNORMAL HIGH (ref 22–32)
Calcium: 8.8 mg/dL — ABNORMAL LOW (ref 8.9–10.3)
Chloride: 98 mmol/L (ref 98–111)
Creatinine: 1.17 mg/dL — ABNORMAL HIGH (ref 0.44–1.00)
GFR, Estimated: 44 mL/min — ABNORMAL LOW (ref >60)
Glucose, Bld: 99 mg/dL (ref 70–99)
Potassium: 3.8 mmol/L (ref 3.5–5.1)
Sodium: 141 mmol/L (ref 135–145)
Total Bilirubin: 0.4 mg/dL (ref 0.0–1.2)
Total Protein: 6.5 g/dL (ref 6.5–8.1)

## 2024-01-07 LAB — DIGOXIN LEVEL: Digoxin Level: 0.6 ng/mL — ABNORMAL LOW (ref 0.8–2.0)

## 2024-01-08 ENCOUNTER — Telehealth: Payer: Self-pay | Admitting: Pharmacist

## 2024-01-08 ENCOUNTER — Ambulatory Visit: Payer: Self-pay | Admitting: Pharmacist

## 2024-01-08 NOTE — Telephone Encounter (Signed)
 Contacted daughter to let her know I contacted [facility] at [phone] and changed digoxin  order to 0.125mg  daily except for Wednesdays and furosemide  20mg  daily as needed. Daughter appreciative.

## 2024-01-12 ENCOUNTER — Other Ambulatory Visit: Payer: Self-pay

## 2024-01-12 NOTE — Progress Notes (Signed)
 Specialty Pharmacy Refill Coordination Note  Tricia Hernandez is a 89 y.o. female contacted today regarding refills of specialty medication(s) Osimertinib  Mesylate (TAGRISSO )   Patient requested Delivery   Delivery date: 01/20/24   Verified address: [address] [place] [address]   Medication will be filled on 01/19/24.

## 2024-01-12 NOTE — Progress Notes (Signed)
 Specialty Pharmacy Ongoing Clinical Assessment Note  Tricia Hernandez is a 89 y.o. female who is being followed by the specialty pharmacy service for RxSp Oncology   Patient's specialty medication(s) reviewed today: Osimertinib  Mesylate (TAGRISSO )   Missed doses in the last 4 weeks: 2   Patient/Caregiver did not have any additional questions or concerns.   Therapeutic benefit summary: Patient is achieving benefit   Adverse events/side effects summary: No adverse events/side effects   Patient's therapy is appropriate to: Continue    Goals Addressed             This Visit's Progress    Slow Disease Progression       Patient is on track. Patient will maintain adherence.  At visit on 12/22/23, Dr. Marguerita Shih reported that hemoglobin increased from 8.5 to 10, and platelets from 80 to 105 and plan to have a chest CT in 3 weeks. Chest and head scans from March were normal         Follow up: 3 months  [facility] Specialty Pharmacist

## 2024-01-15 ENCOUNTER — Other Ambulatory Visit: Payer: Self-pay | Admitting: Internal Medicine

## 2024-01-19 ENCOUNTER — Inpatient Hospital Stay: Attending: Hematology and Oncology | Admitting: Internal Medicine

## 2024-01-19 ENCOUNTER — Other Ambulatory Visit: Payer: Self-pay

## 2024-01-19 VITALS — BP 128/66 | HR 87 | Temp 97.9°F | Resp 17 | Ht 64.0 in | Wt 204.2 lb

## 2024-01-19 DIAGNOSIS — Z86711 Personal history of pulmonary embolism: Secondary | ICD-10-CM | POA: Diagnosis not present

## 2024-01-19 DIAGNOSIS — M25511 Pain in right shoulder: Secondary | ICD-10-CM | POA: Insufficient documentation

## 2024-01-19 DIAGNOSIS — E785 Hyperlipidemia, unspecified: Secondary | ICD-10-CM | POA: Insufficient documentation

## 2024-01-19 DIAGNOSIS — Z923 Personal history of irradiation: Secondary | ICD-10-CM | POA: Insufficient documentation

## 2024-01-19 DIAGNOSIS — C342 Malignant neoplasm of middle lobe, bronchus or lung: Secondary | ICD-10-CM | POA: Insufficient documentation

## 2024-01-19 DIAGNOSIS — Z885 Allergy status to narcotic agent status: Secondary | ICD-10-CM | POA: Diagnosis not present

## 2024-01-19 DIAGNOSIS — Z90710 Acquired absence of both cervix and uterus: Secondary | ICD-10-CM | POA: Diagnosis not present

## 2024-01-19 DIAGNOSIS — Z880 Allergy status to penicillin: Secondary | ICD-10-CM | POA: Diagnosis not present

## 2024-01-19 DIAGNOSIS — Z9049 Acquired absence of other specified parts of digestive tract: Secondary | ICD-10-CM | POA: Insufficient documentation

## 2024-01-19 DIAGNOSIS — I110 Hypertensive heart disease with heart failure: Secondary | ICD-10-CM | POA: Insufficient documentation

## 2024-01-19 DIAGNOSIS — Z886 Allergy status to analgesic agent status: Secondary | ICD-10-CM | POA: Diagnosis not present

## 2024-01-19 DIAGNOSIS — R079 Chest pain, unspecified: Secondary | ICD-10-CM | POA: Diagnosis not present

## 2024-01-19 DIAGNOSIS — Z79899 Other long term (current) drug therapy: Secondary | ICD-10-CM | POA: Diagnosis not present

## 2024-01-19 DIAGNOSIS — Z85048 Personal history of other malignant neoplasm of rectum, rectosigmoid junction, and anus: Secondary | ICD-10-CM | POA: Diagnosis not present

## 2024-01-19 DIAGNOSIS — C3411 Malignant neoplasm of upper lobe, right bronchus or lung: Secondary | ICD-10-CM | POA: Diagnosis not present

## 2024-01-19 DIAGNOSIS — R519 Headache, unspecified: Secondary | ICD-10-CM | POA: Insufficient documentation

## 2024-01-19 DIAGNOSIS — F419 Anxiety disorder, unspecified: Secondary | ICD-10-CM | POA: Diagnosis not present

## 2024-01-19 DIAGNOSIS — I4891 Unspecified atrial fibrillation: Secondary | ICD-10-CM | POA: Insufficient documentation

## 2024-01-19 DIAGNOSIS — Z882 Allergy status to sulfonamides status: Secondary | ICD-10-CM | POA: Diagnosis not present

## 2024-01-19 NOTE — Progress Notes (Signed)
 [facility] [phone]) [phone]   [phone]  OFFICE PROGRESS NOTE  [name], FNP [address] [place] [address]  DIAGNOSIS: Recurrent and metastatic Non-small cell lung cancer, adenocarcinoma.  She was initially diagnosed in 2013 status post right middle lobe resection, she was found to have recurrent disease in 2024 with extensive pleural nodularity in the right hemithorax without any nodal or pulmonary parenchymal involvement.  There is no evidence of extrathoracic metastatic disease.   Biomarker Findings HRD signature - HRDsig Negative Microsatellite status - MS-Stable Tumor Mutational Burden - 4 Muts/Mb Genomic Findings For a complete list of the genes assayed, please refer to the Appendix. EGFR L858R, T790M PBRM1 [id number]*49 RB1 R455* TP53 C141W 7 Disease relevant genes with no reportable alterations: ALK, BRAF, ERBB2, KRAS, MET, RET, ROS1 PD-L1 expression 55% PRIOR THERAPY: anal cancer treated with radiation and surgery in 2017    CURRENT THERAPY: Tagrisso  80 mg p.o. daily, first dose June 27, 2023.  This was reduced to Tagrisso  40 mg p.o. daily in March 2025.  INTERVAL HISTORY: LANETTE Hernandez 89 y.o. female returns to the clinic today for follow-up visit accompanied by her daughter.Discussed the use of AI scribe software for clinical note transcription with the patient, who gave verbal consent to proceed.  History of Present Illness   Tricia Hernandez is a 89 year old female with recurrent and metastatic non-small cell lung cancer who presents for evaluation with repeat CT for restaging of her disease. She is accompanied by her daughter.  She has a history of recurrent and metastatic non-small cell lung cancer, initially diagnosed in 2013. She underwent a right middle lobectomy and experienced disease recurrence in November 2024. 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  She experiences pain on the right side of her chest radiating into her right shoulder. Additionally, she has leg pain, which she attributes to arthritis or possibly exercise. She is currently undergoing physical and occupational therapy at [facility] in [place], [place] , where she has been for three weeks.  She mentions frequent headaches, which are intermittent. She has had a brain scan previously. She also reports fatigue, with increased sleep duration, going to bed early and waking up late. She is retired and no longer works.  No other significant symptoms are reported.       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 5/2 EGD: grade A reflux esophagitis.  Mild, non-hemorrhagic gastritis.  Ablation of 3 Duodenal AVMs   Herpes zoster    History of blood transfusion    for vaginal, uterine bleeding leading to hysterectomy.  in 12/2016 transfused x 1 for GI bleed.    History of hiatal hernia    Hyperlipidemia    Hypertension  Labyrinthitis    Lung cancer (HCC)    RML   Obesity    On home oxygen  therapy    "3L just at night when I sleep" (04/20/2018)   Paroxysmal supraventricular tachycardia (HCC)    Peptic ulcer of stomach    hx   Pneumonia 1980s X 1; 08/2016   "walking; double"   Presence of permanent cardiac pacemaker 04/20/2018   Pulmonary embolism (HCC) 05/2014   "left lung"   Seasonal allergies    "bad" (04/20/2018)    Vitamin D  deficiency     ALLERGIES:  is allergic to penicillins; antihistamines, chlorpheniramine-type; aspirin; codeine; hydrochlorothiazide; and sulfa antibiotics.  MEDICATIONS:  Current Outpatient Medications  Medication Sig Dispense Refill   acetaminophen  (TYLENOL ) 325 MG tablet Take 2 tablets (650 mg total) by mouth every 6 (six) hours as needed for mild pain (pain score 1-3), moderate pain (pain score 4-6) or headache.     anti-nausea (EMETROL) solution Take 10 mLs by mouth every 15 (fifteen) minutes as needed for nausea or vomiting.     atorvastatin  (LIPITOR) 20 MG tablet Take 1 tablet (20 mg total) by mouth at bedtime. 90 tablet 3   digoxin  (LANOXIN ) 0.125 MG tablet Take 0.125 mg by mouth See admin instructions. Take 1 Tablet every day except for Wednesdays     diltiazem  (CARDIZEM  CD) 120 MG 24 hr capsule Take 1 capsule (120 mg total) by mouth daily. (Patient taking differently: Take 120 mg by mouth at bedtime.) 90 capsule 2   ferrous sulfate  325 (65 FE) MG EC tablet Take 325 mg by mouth daily with breakfast.     fexofenadine  (ALLERGY RELIEF) 180 MG tablet Take 1 tablet (180 mg total) by mouth daily. (Patient taking differently: Take 180 mg by mouth at bedtime.) 90 tablet 3   fluticasone  (FLONASE ) 50 MCG/ACT nasal spray Place 1 spray into both nostrils daily.     Fluticasone -Umeclidin-Vilant (TRELEGY ELLIPTA ) 200-62.5-25 MCG/ACT AEPB Inhale 1 puff into the lungs daily. 180 each 3   furosemide  (LASIX ) 20 MG tablet TAKE ONE TABLET BY MOUTH DAILY 90 tablet 0   Multiple Vitamins-Minerals (PRESERVISION AREDS 2) CAPS Take 1 capsule by mouth in the morning and at bedtime.     nitrofurantoin , macrocrystal-monohydrate, (MACROBID ) 100 MG capsule Take 1 capsule (100 mg total) by mouth 2 (two) times daily. (Patient taking differently: Take 100 mg by mouth daily at 12 noon.) 14 capsule 0   osimertinib  mesylate (TAGRISSO ) 40 MG tablet Take 1 tablet (40 mg total) by mouth daily. 30 tablet 2    PARoxetine  (PAXIL ) 20 MG tablet Take 1 tablet (20 mg total) by mouth at bedtime.     Vitamin D , Ergocalciferol , (DRISDOL ) 1.25 MG (50000 UNIT) CAPS capsule TAKE 1 CAPSULE BY MOUTH 1 TIME A WEEK (Patient taking differently: Take 50,000 Units by mouth every 7 (seven) days. Monday) 12 capsule 1   No current facility-administered medications for this visit.   Facility-Administered Medications Ordered in Other Visits  Medication Dose Route Frequency Provider Last Rate Last Admin   sodium phosphate  (FLEET) 7-19 GM/118ML enema 2 enema  2 enema Rectal Once Melvenia Stabs, MD        SURGICAL HISTORY:  Past Surgical History:  Procedure Laterality Date   APPENDECTOMY  1959   BASAL CELL CARCINOMA EXCISION     nose x 2   BREAST CYST ASPIRATION Right 2016?   CATARACT EXTRACTION W/ INTRAOCULAR LENS  IMPLANT, BILATERAL Bilateral 1990s   CHOLECYSTECTOMY OPEN  1980s   COLON  SURGERY     COLOSTOMY N/A 07/29/2016   Procedure: COLOSTOMY;  Surgeon: Adalberto Acton, MD;  Location: [facility] OR;  Service: General;  Laterality: N/A;   DILATION AND CURETTAGE OF UTERUS     ESOPHAGOGASTRODUODENOSCOPY N/A 12/11/2016   Procedure: ESOPHAGOGASTRODUODENOSCOPY (EGD);  Surgeon: Nannette Babe, MD;  Location: [facility] ENDOSCOPY;  Service: Endoscopy;  Laterality: N/A;   EVALUATION UNDER ANESTHESIA WITH HEMORRHOIDECTOMY AND PROCTOSCOPY N/A 07/25/2016   Procedure: EXAM UNDER ANESTHESIA, EXCISION PERIANAL MASS.;  Surgeon: Jerryl Morin, MD;  Location: [facility] OR;  Service: General;  Laterality: N/A;  Prone position   FLEXIBLE SIGMOIDOSCOPY N/A 06/16/2017   Procedure: FLEXIBLE SIGMOIDOSCOPY EXAM UNDER ANESHESIA;  Surgeon: Melvenia Stabs, MD;  Location: [facility];  Service: General;  Laterality: N/A;   INSERT / REPLACE / REMOVE PACEMAKER  04/20/2018   IR RADIOLOGIST EVAL & MGMT  03/25/2017   LAPAROSCOPIC DIVERTED COLOSTOMY N/A 07/29/2016   Procedure: ATTEMPTED LAPAROSCOPIC ASSISTED COLOSTOMY;  Surgeon: Adalberto Acton, MD;  Location:  [facility] OR;  Service: General;  Laterality: N/A;   LUNG REMOVAL, PARTIAL Right 2013   RML mass/carcinoid, Dr Alonna James   PACEMAKER IMPLANT N/A 04/20/2018   Procedure: PACEMAKER IMPLANT;  Surgeon: Tammie Fall, MD;  Location: [facility] INVASIVE CV LAB;  Service: Cardiovascular;  Laterality: N/A;   SQUAMOUS CELL CARCINOMA EXCISION     rectum, right leg   TONSILLECTOMY AND ADENOIDECTOMY  1960s   VAGINAL HYSTERECTOMY  1959   "partial"    REVIEW OF SYSTEMS:  Constitutional: positive for fatigue Eyes: negative Ears, nose, mouth, throat, and face: negative Respiratory: positive for dyspnea on exertion and pleurisy/chest pain Cardiovascular: negative Gastrointestinal: negative Genitourinary:negative Integument/breast: negative Hematologic/lymphatic: negative Musculoskeletal:positive for arthralgias and muscle weakness Neurological: positive for headaches Behavioral/Psych: negative Endocrine: negative Allergic/Immunologic: negative   PHYSICAL EXAMINATION: General appearance: alert, cooperative, fatigued, and no distress Head: Normocephalic, without obvious abnormality, atraumatic Neck: no adenopathy, no JVD, supple, symmetrical, trachea midline, and thyroid  not enlarged, symmetric, no tenderness/mass/nodules Lymph nodes: Cervical, supraclavicular, and axillary nodes normal. Resp: clear to auscultation bilaterally Back: symmetric, no curvature. ROM normal. No CVA tenderness. Cardio: regular rate and rhythm, S1, S2 normal, no murmur, click, rub or gallop GI: soft, non-tender; bowel sounds normal; no masses,  no organomegaly Extremities: extremities normal, atraumatic, no cyanosis or edema Neurologic: Alert and oriented X 3, normal strength and tone. Normal symmetric reflexes. Normal coordination and gait  ECOG PERFORMANCE STATUS: 1 - Symptomatic but completely ambulatory  Blood pressure 128/66, pulse 87, temperature 97.9 F (36.6 C), temperature source Tympanic, resp. rate 17, height 5\' 4"  (1.626  m), weight 204 lb 3.2 oz (92.6 kg), SpO2 96%.  LABORATORY DATA: Lab Results  Component Value Date   WBC 6.1 01/07/2024   HGB 9.5 (L) 01/07/2024   HCT 31.0 (L) 01/07/2024   MCV 81.2 01/07/2024   PLT 104 (L) 01/07/2024      Chemistry      Component Value Date/Time   NA 141 01/07/2024 1120   NA 142 05/07/2022 1010   NA 144 12/30/2016 0850   K 3.8 01/07/2024 1120   K 4.1 12/30/2016 0850   CL 98 01/07/2024 1120   CO2 34 (H) 01/07/2024 1120   CO2 33 (H) 12/30/2016 0850   BUN 14 01/07/2024 1120   BUN 18 05/07/2022 1010   BUN 27.8 (H) 12/30/2016 0850   CREATININE 1.17 (H) 01/07/2024 1120   CREATININE 1.0 12/30/2016 0850      Component Value Date/Time   CALCIUM  8.8 (L)  01/07/2024 1120   CALCIUM  9.7 12/30/2016 0850   ALKPHOS 89 01/07/2024 1120   ALKPHOS 46 12/30/2016 0850   AST 15 01/07/2024 1120   AST 17 12/30/2016 0850   ALT 10 01/07/2024 1120   ALT 10 12/30/2016 0850   BILITOT 0.4 01/07/2024 1120   BILITOT 0.32 12/30/2016 0850       RADIOGRAPHIC STUDIES: CT ABDOMEN PELVIS WO CONTRAST Result Date: 01/07/2024 EXAMINATION: CT ABDOMEN PELVIS WO CONTRAST (accession [id number] [facility]), CT CHEST WO CONTRAST (accession [id number]) CLINICAL INDICATION: Female, 89 years old. Non-small cell lung cancer (NSCLC), staging TECHNIQUE: Helical CT scan examination of the chest, abdomen and pelvis is performed from the domes of the diaphragm to the pubic symphysis. Limited evaluation of the solid organs due to lack of intravenous contrast. Unless otherwise specified, incidental thyroid , adrenal, renal lesions do not require dedicated imaging follow up. Additionally, any mentioned pulmonary nodules do not require dedicated imaging follow-up based on the Fleischner guidelines unless otherwise specified. Coronary calcifications are not identified unless otherwise specified. COMPARISON: 10/14/2023 FINDINGS: Dual-chamber cardiac pacemaker noted. The visualized thyroid  appears normal. The thoracic aorta  is normal in caliber. Scattered calcified atherosclerotic changes are present. The main pulmonary artery is normal in caliber. The heart is mildly enlarged. There are coronary calcifications with calcification of the aortic annulus. There is no free fluid or pathologic lymphadenopathy by size criteria. The trachea and mainstem bronchi are patent. Subsegmental atelectatic changes are noted within the lung bases. Wedge resection again noted in the right middle lobe. Similar somewhat nodular pleural thickening and scarring noted on the right posterior hemithorax favored to represent scarring and/or posttreatment changes. The liver appears normal. Gallbladder is surgically absent. The spleen is normal. Pancreas is normal. The adrenals are normal. There is right renal cortical scarring. Left renal cyst is seen. Abdominal aorta is normal in caliber. Scattered calcified atherosclerotic changes are present. The urinary bladder appears normal. The uterus is surgically absent. There is colonic diverticulosis. Left lower quadrant colostomy noted. Large and small bowel loops are otherwise within normal limits. There is no free fluid or adenopathy. There is diffuse osseous demineralization. There are degenerative changes of the spine and bony pelvis. IMPRESSION: Right middle lobe wedge resection with similar subtle nodular pleural thickening along the right posterior hemithorax favoring posttreatment changes with no convincing evidence for active malignancy within the chest, abdomen, or pelvis. DOSE REDUCTION: This exam was performed according to our departmental dose-optimization program which includes automated exposure control, adjustment of the mA and/or kV according to patient size and/or use of iterative reconstruction technique. Electronically signed by: [place] Engel MD 01/07/2024 05:38 PM EDT RP Workstation: [id number]   CT Chest Wo Contrast Result Date: 01/07/2024 EXAMINATION: CT ABDOMEN PELVIS WO CONTRAST (accession  [id number] [facility]), CT CHEST WO CONTRAST (accession [id number] [facility]) CLINICAL INDICATION: Female, 89 years old. Non-small cell lung cancer (NSCLC), staging TECHNIQUE: Helical CT scan examination of the chest, abdomen and pelvis is performed from the domes of the diaphragm to the pubic symphysis. Limited evaluation of the solid organs due to lack of intravenous contrast. Unless otherwise specified, incidental thyroid , adrenal, renal lesions do not require dedicated imaging follow up. Additionally, any mentioned pulmonary nodules do not require dedicated imaging follow-up based on the Fleischner guidelines unless otherwise specified. Coronary calcifications are not identified unless otherwise specified. COMPARISON: 10/14/2023 FINDINGS: Dual-chamber cardiac pacemaker noted. The visualized thyroid  appears normal. The thoracic aorta is normal in caliber. Scattered calcified atherosclerotic changes are present. The main pulmonary artery is normal in caliber. The heart  is mildly enlarged. There are coronary calcifications with calcification of the aortic annulus. There is no free fluid or pathologic lymphadenopathy by size criteria. The trachea and mainstem bronchi are patent. Subsegmental atelectatic changes are noted within the lung bases. Wedge resection again noted in the right middle lobe. Similar somewhat nodular pleural thickening and scarring noted on the right posterior hemithorax favored to represent scarring and/or posttreatment changes. The liver appears normal. Gallbladder is surgically absent. The spleen is normal. Pancreas is normal. The adrenals are normal. There is right renal cortical scarring. Left renal cyst is seen. Abdominal aorta is normal in caliber. Scattered calcified atherosclerotic changes are present. The urinary bladder appears normal. The uterus is surgically absent. There is colonic diverticulosis. Left lower quadrant colostomy noted. Large and small bowel loops are otherwise within normal limits.  There is no free fluid or adenopathy. There is diffuse osseous demineralization. There are degenerative changes of the spine and bony pelvis. IMPRESSION: Right middle lobe wedge resection with similar subtle nodular pleural thickening along the right posterior hemithorax favoring posttreatment changes with no convincing evidence for active malignancy within the chest, abdomen, or pelvis. DOSE REDUCTION: This exam was performed according to our departmental dose-optimization program which includes automated exposure control, adjustment of the mA and/or kV according to patient size and/or use of iterative reconstruction technique. Electronically signed by: [place] Engel MD 01/07/2024 05:38 PM EDT RP Workstation: [id number]    ASSESSMENT AND PLAN: This is a very pleasant 89 years old white female diagnosed with Recurrent and metastatic Non-small cell lung cancer, adenocarcinoma.  She was initially diagnosed in 2013 status post right middle lobe resection, she was found to have recurrent disease in 2024 with extensive pleural nodularity in the right hemithorax without any nodal or pulmonary parenchymal involvement.  There is no evidence of extrathoracic metastatic disease. Molecular studies showed positive EGFR mutation in exon 21 with L858R as well as T790M mutation.  PD-L1 expression was 55%. The patient started treatment with Tagrisso  80 mg p.o. daily on June 27, 2023.  She is currently on Tagrisso  40 mg p.o. daily since March 2025 secondary to intolerance of the higher dose. She is tolerating her treatment fairly well. She had a repeat CT scan of the chest, abdomen pelvis performed recently.  I personally and independently reviewed the scan and discussed the result with the patient and her daughter.  Her scan showed no concerning findings for disease progression. Assessment and Plan    Recurrent metastatic lung cancer Recurrent metastatic lung cancer with EGFR mutation positive, initially diagnosed in  2013. Post right middle lobectomy with evidence of disease recurrence in November 2024. Currently on Tagrisso  40 mg PO daily since November 2024 for positive EGFR mutation L858R and T790M mutation. Recent CT scan shows well-managed disease with no growth or spread, indicating that Tagrisso  is effective. - Continue Tagrisso  40 mg PO daily - Evaluate for potential discharge from rehabilitation facility next week if condition allows.  Arthralgia Reports pain in the right side of the chest and shoulder area, as well as leg pain. Currently undergoing physical and occupational therapy at a rehabilitation center in [place], [place] .  Fatigue Experiences significant fatigue, sleeping from 8:30 PM to 9:30 AM. Increased sleep is acceptable given her retired status and current health condition.  Headache Intermittent headaches that come and go, not constant. Tylenol  recommended for mild headaches. - Use Tylenol  for mild headaches.   She was advised to call immediately if she has any other concerning symptoms in the  interval. The patient voices understanding of current disease status and treatment options and is in agreement with the current care plan.  All questions were answered. The patient knows to call the clinic with any problems, questions or concerns. We can certainly see the patient much sooner if necessary.  The total time spent in the appointment was 30 minutes.  Disclaimer: This note was dictated with voice recognition software. Similar sounding words can inadvertently be transcribed and may not be corrected upon review.

## 2024-01-30 ENCOUNTER — Other Ambulatory Visit: Payer: Self-pay

## 2024-02-10 ENCOUNTER — Other Ambulatory Visit (HOSPITAL_COMMUNITY): Payer: Self-pay

## 2024-02-10 ENCOUNTER — Other Ambulatory Visit: Payer: Self-pay

## 2024-02-12 ENCOUNTER — Other Ambulatory Visit (HOSPITAL_COMMUNITY): Payer: Self-pay

## 2024-02-17 ENCOUNTER — Other Ambulatory Visit: Payer: Self-pay

## 2024-02-18 ENCOUNTER — Other Ambulatory Visit: Payer: Self-pay

## 2024-02-18 ENCOUNTER — Other Ambulatory Visit: Payer: Self-pay | Admitting: Pharmacy Technician

## 2024-02-18 ENCOUNTER — Other Ambulatory Visit (HOSPITAL_COMMUNITY): Payer: Self-pay

## 2024-02-18 NOTE — Progress Notes (Signed)
 Specialty Pharmacy Refill Coordination Note  Tricia Hernandez is a 89 y.o. female contacted today regarding refills of specialty medication(s) Osimertinib  Mesylate (TAGRISSO )  [facility] with [facility] from [facility]  Patient requested Delivery   Delivery date: 03/09/24   Verified address: [address] [place]   Medication will be filled on 03/08/24.

## 2024-02-24 NOTE — Progress Notes (Signed)
 [facility] OFFICE PROGRESS NOTE  Tricia Leita Repine, FNP [address] [place] [address]  DIAGNOSIS:  Recurrent and metastatic Non-small cell lung cancer, adenocarcinoma.  She was initially diagnosed in 2013 status post right middle lobe resection, she was found to have recurrent disease in 2024 with extensive pleural nodularity in the right hemithorax without any nodal or pulmonary parenchymal involvement.  There is no evidence of extrathoracic metastatic disease.   Biomarker Findings HRD signature - HRDsig Negative Microsatellite status - MS-Stable Tumor Mutational Burden - 4 Muts/Mb Genomic Findings For a complete list of the genes assayed, please refer to the Appendix. EGFR L858R, T790M PBRM1 [id number]*49 RB1 R455* TP53 C141W 7 Disease relevant genes with no reportable alterations: ALK, BRAF, ERBB2, KRAS, MET, RET, ROS1 PD-L1 expression 55%  PRIOR THERAPY: Anal cancer treated with radiation and surgery in 2017   CURRENT THERAPY: Tagrisso  80 mg p.o. daily, first dose June 27, 2023. Status post 3.5 months. This was reduced to Tagrisso  40 mg p.o. daily in February 2025.   INTERVAL HISTORY: DANIELL PARADISE 89 y.o. female returns to the clinic today for a follow-up visit accompanied by her daughter today. The patient is currently undergoing targeted treatment with Tagrisso  with a reduced dose. She tolerates the reduced dose better.    She has been experiencing recurrent urinary tract infections, with the most recent episode involving an antibiotic-resistant strain. She was hospitalized in May and subsequently transferred to a rehabilitation facility. Her rehab facility is in [place] and is called [facility]. She recently completed a 10-day course of antibiotics via IV. She is being treated by urology. She sees Dr. Carolynn.   She has a history of squamous cell carcinoma of the anus, for which she was treated and no longer requires  follow-up for this condition. She saw Dr. Odean earlier today.   She is taking an iron supplement daily. No reports of bleeding symptoms such as epistaxis, gum bleeding, or blood in the urine or stool. Her stools are described as brown, not black or tarry. She frequently feels cold, possibly due to anemia, and her appetite fluctuates.  She uses oxygen  for breathing support, with no recent changes in her respiratory status. No fevers, chills, or night sweats. No nausea, vomiting, diarrhea, or constipation.  She reports right-sided pain from her shoulder down, which was present during her last CT scan but was not attributed to any specific findings. She has a history of arthritis and demineralized bones.  She is here today for evaluation and repeat blood work. Of note she is on digoxin  by cardiology and they are monitoring her level. Her digoxin  is normal for today.     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 5/2 EGD: grade A reflux esophagitis.  Mild, non-hemorrhagic gastritis.  Ablation of 3 Duodenal AVMs   Herpes zoster  History of blood transfusion    for vaginal, uterine bleeding leading to hysterectomy.  in 12/2016 transfused x 1 for GI bleed.    History of hiatal hernia    Hyperlipidemia    Hypertension    Labyrinthitis    Lung cancer (HCC)    RML   Obesity    On home oxygen  therapy    3L just at night when I sleep (04/20/2018)   Paroxysmal supraventricular tachycardia (HCC)    Peptic  ulcer of stomach    hx   Pneumonia 1980s X 1; 08/2016   walking; double   Presence of permanent cardiac pacemaker 04/20/2018   Pulmonary embolism (HCC) 05/2014   left lung   Seasonal allergies    bad (04/20/2018)   Vitamin D  deficiency     ALLERGIES:  is allergic to penicillins; antihistamines, chlorpheniramine-type; aspirin; codeine; hydrochlorothiazide; and sulfa antibiotics.  MEDICATIONS:  Current Outpatient Medications  Medication Sig Dispense Refill   acetaminophen  (TYLENOL ) 325 MG tablet Take 2 tablets (650 mg total) by mouth every 6 (six) hours as needed for mild pain (pain score 1-3), moderate pain (pain score 4-6) or headache.     anti-nausea (EMETROL) solution Take 10 mLs by mouth every 15 (fifteen) minutes as needed for nausea or vomiting.     atorvastatin  (LIPITOR) 20 MG tablet Take 1 tablet (20 mg total) by mouth at bedtime. 90 tablet 3   digoxin  (LANOXIN ) 0.125 MG tablet Take 0.125 mg by mouth See admin instructions. Take 1 Tablet every day except for Wednesdays     diltiazem  (CARDIZEM  CD) 120 MG 24 hr capsule Take 1 capsule (120 mg total) by mouth daily. (Patient taking differently: Take 120 mg by mouth at bedtime.) 90 capsule 2   ferrous sulfate  325 (65 FE) MG EC tablet Take 325 mg by mouth daily with breakfast.     fexofenadine  (ALLERGY RELIEF) 180 MG tablet Take 1 tablet (180 mg total) by mouth daily. (Patient taking differently: Take 180 mg by mouth at bedtime.) 90 tablet 3   fluticasone  (FLONASE ) 50 MCG/ACT nasal spray Place 1 spray into both nostrils daily.     Fluticasone -Umeclidin-Vilant (TRELEGY ELLIPTA ) 200-62.5-25 MCG/ACT AEPB Inhale 1 puff into the lungs daily. 180 each 3   furosemide  (LASIX ) 20 MG tablet TAKE ONE TABLET BY MOUTH DAILY 90 tablet 0   Multiple Vitamins-Minerals (PRESERVISION AREDS 2) CAPS Take 1 capsule by mouth in the morning and at bedtime.     nitrofurantoin , macrocrystal-monohydrate, (MACROBID ) 100 MG capsule Take 1 capsule (100 mg total)  by mouth 2 (two) times daily. (Patient taking differently: Take 100 mg by mouth daily at 12 noon.) 14 capsule 0   osimertinib  mesylate (TAGRISSO ) 40 MG tablet Take 1 tablet (40 mg total) by mouth daily. 30 tablet 2   PARoxetine  (PAXIL ) 20 MG tablet Take 1 tablet (20 mg total) by mouth at bedtime.     Vitamin D , Ergocalciferol , (DRISDOL ) 1.25 MG (50000 UNIT) CAPS capsule TAKE 1 CAPSULE BY MOUTH 1 TIME A WEEK (Patient taking differently: Take 50,000 Units by mouth every 7 (seven) days. Monday) 12 capsule 1   No current facility-administered medications for this visit.   Facility-Administered Medications Ordered in Other Visits  Medication Dose Route Frequency Provider Last Rate Last Admin   sodium phosphate  (FLEET) 7-19 GM/118ML enema 2 enema  2 enema Rectal Once Teresa Lonni HERO, MD        SURGICAL HISTORY:  Past Surgical History:  Procedure Laterality Date   APPENDECTOMY  1959  BASAL CELL CARCINOMA EXCISION     nose x 2   BREAST CYST ASPIRATION Right 2016?   CATARACT EXTRACTION W/ INTRAOCULAR LENS  IMPLANT, BILATERAL Bilateral 1990s   CHOLECYSTECTOMY OPEN  1980s   COLON SURGERY     COLOSTOMY N/A 07/29/2016   Procedure: COLOSTOMY;  Surgeon: Mitzie DELENA Freund, MD;  Location: [facility] OR;  Service: General;  Laterality: N/A;   DILATION AND CURETTAGE OF UTERUS     ESOPHAGOGASTRODUODENOSCOPY N/A 12/11/2016   Procedure: ESOPHAGOGASTRODUODENOSCOPY (EGD);  Surgeon: Gordy CHRISTELLA Starch, MD;  Location: [facility] END[facility]COPY;  Service: Endoscopy;  Laterality: N/A;   EVALUATION UNDER ANESTHESIA WITH HEMORRHOIDECTOMY AND PROCTOSCOPY N/A 07/25/2016   Procedure: EXAM UNDER ANESTHESIA, EXCISION PERIANAL MASS.;  Surgeon: Lynwood Pina, MD;  Location: [facility] OR;  Service: General;  Laterality: N/A;  Prone position   FLEXIBLE SIGMOIDOSCOPY N/A 06/16/2017   Procedure: FLEXIBLE SIGMOIDOSCOPY EXAM UNDER ANESHESIA;  Surgeon: Teresa Lonni CHRISTELLA, MD;  Location: [facility];  Service: General;  Laterality: N/A;   INSERT / REPLACE /  REMOVE PACEMAKER  04/20/2018   IR RADIOLOGIST EVAL & MGMT  03/25/2017   LAPAROSCOPIC DIVERTED COLOSTOMY N/A 07/29/2016   Procedure: ATTEMPTED LAPAROSCOPIC ASSISTED COLOSTOMY;  Surgeon: Mitzie DELENA Freund, MD;  Location: [facility] OR;  Service: General;  Laterality: N/A;   LUNG REMOVAL, PARTIAL Right 2013   RML mass/carcinoid, Dr Jerona   PACEMAKER IMPLANT N/A 04/20/2018   Procedure: PACEMAKER IMPLANT;  Surgeon: Waddell Danelle ORN, MD;  Location: [facility] INVASIVE CV LAB;  Service: Cardiovascular;  Laterality: N/A;   SQUAMOUS CELL CARCINOMA EXCISION     rectum, right leg   TONSILLECTOMY AND ADENOIDECTOMY  1960s   VAGINAL HYSTERECTOMY  1959   partial    REVIEW OF SYSTEMS:   Review of Systems  Constitutional: Positive for fatigue. Negative for appetite change, chills, fever and unexpected weight change.  HENT: Negative for mouth sores, nosebleeds, sore throat and trouble swallowing.   Eyes: Negative for eye problems and icterus.  Respiratory: Negative for cough, hemoptysis, shortness of breath and wheezing.   Cardiovascular: Negative for chest pain and leg swelling.  Gastrointestinal: Negative for abdominal pain, constipation, diarrhea, nausea and vomiting.  Genitourinary: Negative for bladder incontinence, difficulty urinating, dysuria, frequency and hematuria.   Musculoskeletal: Positive for back pain if sitting too long. Negative for gait problem, neck pain and neck stiffness.  Skin: Negative for itching and rash.  Neurological: Negative for dizziness, extremity weakness, gait problem, headaches, light-headedness and seizures.  Hematological: Negative for adenopathy. Does not bruise/bleed easily.  Psychiatric/Behavioral: Positive for intermittent confusion. Negative for  depression and sleep disturbance. The patient is not nervous/anxious.     PHYSICAL EXAMINATION:  There were no vitals taken for this visit.  ECOG PERFORMANCE STATUS: 3  Physical Exam  Constitutional: Oriented to person, place, and  time and elderly appearing female and in no distress.  HENT:  Head: Normocephalic and atraumatic. Hard of hearing.  Mouth/Throat: Oropharynx is clear and moist. No oropharyngeal exudate.  Eyes: Conjunctivae are normal. Right eye exhibits no discharge. Left eye exhibits no discharge. No scleral icterus.  Neck: Normal range of motion. Neck supple.  Cardiovascular: Normal rate, regular rhythm, normal heart sounds and intact distal pulses.   Pulmonary/Chest: Effort normal and breath sounds normal. No respiratory distress. No wheezes. No rales.  Abdominal: Soft. Bowel sounds are normal. Exhibits no distension and no mass. There is no tenderness.  Musculoskeletal: Normal range of motion. Stable bilateral edema in the lower extremities bilaterally.  Lymphadenopathy:  No cervical adenopathy.  Neurological: Alert and oriented to person, place, and time. Exhibits muscle wasting. She was examined in the wheelchair.  Skin: Skin is warm and dry. No rash noted. Not diaphoretic. No erythema. No pallor.  Psychiatric: Mood, memory and judgment normal.  Vitals reviewed.   LABORATORY DATA: Lab Results  Component Value Date   WBC 6.1 01/07/2024   HGB 9.5 (L) 01/07/2024   HCT 31.0 (L) 01/07/2024   MCV 81.2 01/07/2024   PLT 104 (L) 01/07/2024      Chemistry      Component Value Date/Time   NA 141 01/07/2024 1120   NA 142 05/07/2022 1010   NA 144 12/30/2016 0850   K 3.8 01/07/2024 1120   K 4.1 12/30/2016 0850   CL 98 01/07/2024 1120   CO2 34 (H) 01/07/2024 1120   CO2 33 (H) 12/30/2016 0850   BUN 14 01/07/2024 1120   BUN 18 05/07/2022 1010   BUN 27.8 (H) 12/30/2016 0850   CREATININE 1.17 (H) 01/07/2024 1120   CREATININE 1.0 12/30/2016 0850      Component Value Date/Time   CALCIUM  8.8 (L) 01/07/2024 1120   CALCIUM  9.7 12/30/2016 0850   ALKPHOS 89 01/07/2024 1120   ALKPHOS 46 12/30/2016 0850   AST 15 01/07/2024 1120   AST 17 12/30/2016 0850   ALT 10 01/07/2024 1120   ALT 10 12/30/2016  0850   BILITOT 0.4 01/07/2024 1120   BILITOT 0.32 12/30/2016 0850       RADIOGRAPHIC STUDIES:  No results found.   ASSESSMENT/PLAN:  This is a very pleasant 89 year old Caucasian female diagnosed with recurrent and metastatic non-small cell lung cancer, adenocarcinoma.  She was initially diagnosed in 2013.  She is status post right middle lobe resection.  She is found to have recurrent disease in 2024 with extensive pleural nodularity in the right hemithorax without any notable or pulmonary parenchymal involvement.  There is no evidence of extrathoracic metastatic disease.  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    PD-L1 expression was 55%. The patient started treatment with Tagrisso  80 mg p.o. daily on June 27, 2023.  This was reduced in February 2025. At a reduced dose of 40 mg p.o. daily.   I have written instructions for the nursing facility. From an oncology standpoint, she will continue with tagrisso  40 mg p.o daily.   We can monitor the anemia closely. She denies bleeding. Recommend they repeat labs in 2 weeks and contact us  if Hbg <8. We will arrange for ferritin, iron, folate, and B12 levels to be drawn today. If any deficiencies, we will let her know if further instructions needed and if additional supplementation is needed.   Labs were reviewed. Recommend she continue with the same treatment at the same dose.    Her digoxin  level is normal. I will route to her cardiology pharmacist.  We will reach out to her cardiologist to relay this to them once available and place the order for next time due to the need to monitor while on tagrisso .   She will continue using hydrocortisone  cream and clindamycin  cream for her rash. Her rash is better at this time   I will arrange for a restaging CT scan of the CAP prior to her next appointment.   Instructed her SNF to continue treatment of her UTI and contact her  urologist if needed for worsening symptoms related to UTI and  monitor her closely.   Tylenol  will be preferred for her back pain which may be related to arthritis but we will include her spine on upcoming imaging. Her pain has been present even prior to her last CT scan which showed DDD and bone demineralization.    Per Dr. Gudena, no longer needs routine follow up for her history of anal cancer.   The patient was advised to call immediately if she has any concerning symptoms in the interval. The patient voices understanding of current disease status and treatment options and is in agreement with the current care plan. All questions were answered. The patient knows to call the clinic with any problems, questions or concerns. We can certainly see the patient much sooner if necessary   No orders of the defined types were placed in this encounter.    The total time spent in the appointment was 30-39 minutes  Vuong Musa L Hence Derrick, PA-C 02/24/24

## 2024-02-26 ENCOUNTER — Other Ambulatory Visit: Payer: Self-pay | Admitting: Physician Assistant

## 2024-02-26 DIAGNOSIS — C3411 Malignant neoplasm of upper lobe, right bronchus or lung: Secondary | ICD-10-CM

## 2024-02-26 DIAGNOSIS — I4819 Other persistent atrial fibrillation: Secondary | ICD-10-CM

## 2024-02-29 NOTE — Assessment & Plan Note (Signed)
 Squamous cell carcinoma of the anus: Patient had bilateral inguinal lymphadenopathy and bilateral lung nodules. It was unclear but the inguinal lymphadenopathy and the lung nodules could be metastatic disease. it could be a stage IIIa versus stage IV.  (Given her age we did not pursue biopsies of the lung)   Treatment summary 08/13/2016-09/25/2016 : XRT to anus S/P diverting colostomy

## 2024-02-29 NOTE — Assessment & Plan Note (Signed)
 Stopped after discontinuing anticoagulation CT CAP 02/01/2020: Stable pulmonary nodules no evidence of metastatic disease. Blood work was reviewed.  I do not recommend routine scans anymore at this time.  Patient is asymptomatic.   Mild anemia: Anemia due to chronic disease.  Today's hemoglobin is 12.8 Frequent falls and mobility issues: using a cane to get around.   Return to clinic in 1 year for follow-up with labs

## 2024-03-01 ENCOUNTER — Inpatient Hospital Stay (HOSPITAL_BASED_OUTPATIENT_CLINIC_OR_DEPARTMENT_OTHER): Admitting: Physician Assistant

## 2024-03-01 ENCOUNTER — Other Ambulatory Visit: Payer: Self-pay

## 2024-03-01 ENCOUNTER — Inpatient Hospital Stay: Payer: Medicare Other | Attending: Hematology and Oncology

## 2024-03-01 ENCOUNTER — Inpatient Hospital Stay (HOSPITAL_BASED_OUTPATIENT_CLINIC_OR_DEPARTMENT_OTHER): Payer: Medicare Other | Admitting: Hematology and Oncology

## 2024-03-01 ENCOUNTER — Other Ambulatory Visit: Payer: Self-pay | Admitting: Physician Assistant

## 2024-03-01 ENCOUNTER — Inpatient Hospital Stay

## 2024-03-01 VITALS — BP 116/64 | HR 65 | Temp 97.8°F | Resp 17 | Ht 64.0 in | Wt 204.0 lb

## 2024-03-01 DIAGNOSIS — Z886 Allergy status to analgesic agent status: Secondary | ICD-10-CM | POA: Diagnosis not present

## 2024-03-01 DIAGNOSIS — K31811 Angiodysplasia of stomach and duodenum with bleeding: Secondary | ICD-10-CM | POA: Diagnosis not present

## 2024-03-01 DIAGNOSIS — Z933 Colostomy status: Secondary | ICD-10-CM | POA: Insufficient documentation

## 2024-03-01 DIAGNOSIS — Z87440 Personal history of urinary (tract) infections: Secondary | ICD-10-CM | POA: Diagnosis not present

## 2024-03-01 DIAGNOSIS — Z885 Allergy status to narcotic agent status: Secondary | ICD-10-CM | POA: Diagnosis not present

## 2024-03-01 DIAGNOSIS — C210 Malignant neoplasm of anus, unspecified: Secondary | ICD-10-CM | POA: Insufficient documentation

## 2024-03-01 DIAGNOSIS — Z79899 Other long term (current) drug therapy: Secondary | ICD-10-CM | POA: Insufficient documentation

## 2024-03-01 DIAGNOSIS — C3411 Malignant neoplasm of upper lobe, right bronchus or lung: Secondary | ICD-10-CM

## 2024-03-01 DIAGNOSIS — I480 Paroxysmal atrial fibrillation: Secondary | ICD-10-CM

## 2024-03-01 DIAGNOSIS — C3490 Malignant neoplasm of unspecified part of unspecified bronchus or lung: Secondary | ICD-10-CM

## 2024-03-01 DIAGNOSIS — D638 Anemia in other chronic diseases classified elsewhere: Secondary | ICD-10-CM | POA: Insufficient documentation

## 2024-03-01 DIAGNOSIS — Z880 Allergy status to penicillin: Secondary | ICD-10-CM | POA: Insufficient documentation

## 2024-03-01 DIAGNOSIS — R410 Disorientation, unspecified: Secondary | ICD-10-CM | POA: Diagnosis not present

## 2024-03-01 DIAGNOSIS — C342 Malignant neoplasm of middle lobe, bronchus or lung: Secondary | ICD-10-CM | POA: Insufficient documentation

## 2024-03-01 DIAGNOSIS — I4819 Other persistent atrial fibrillation: Secondary | ICD-10-CM

## 2024-03-01 DIAGNOSIS — Z923 Personal history of irradiation: Secondary | ICD-10-CM | POA: Diagnosis not present

## 2024-03-01 DIAGNOSIS — Z882 Allergy status to sulfonamides status: Secondary | ICD-10-CM | POA: Diagnosis not present

## 2024-03-01 LAB — CMP (CANCER CENTER ONLY)
ALT: 27 U/L (ref 0–44)
AST: 38 U/L (ref 15–41)
Albumin: 3.8 g/dL (ref 3.5–5.0)
Alkaline Phosphatase: 101 U/L (ref 38–126)
Anion gap: 6 (ref 5–15)
BUN: 13 mg/dL (ref 8–23)
CO2: 37 mmol/L — ABNORMAL HIGH (ref 22–32)
Calcium: 8.9 mg/dL (ref 8.9–10.3)
Chloride: 99 mmol/L (ref 98–111)
Creatinine: 0.99 mg/dL (ref 0.44–1.00)
GFR, Estimated: 54 mL/min — ABNORMAL LOW (ref >60)
Glucose, Bld: 92 mg/dL (ref 70–99)
Potassium: 4.2 mmol/L (ref 3.5–5.1)
Sodium: 142 mmol/L (ref 135–145)
Total Bilirubin: 0.4 mg/dL (ref 0.0–1.2)
Total Protein: 6.6 g/dL (ref 6.5–8.1)

## 2024-03-01 LAB — CBC WITH DIFFERENTIAL (CANCER CENTER ONLY)
Abs Immature Granulocytes: 0.03 K/uL (ref 0.00–0.07)
Basophils Absolute: 0 K/uL (ref 0.0–0.1)
Basophils Relative: 0 %
Eosinophils Absolute: 0.2 K/uL (ref 0.0–0.5)
Eosinophils Relative: 3 %
HCT: 27.9 % — ABNORMAL LOW (ref 36.0–46.0)
Hemoglobin: 8.5 g/dL — ABNORMAL LOW (ref 12.0–15.0)
Immature Granulocytes: 1 %
Lymphocytes Relative: 12 %
Lymphs Abs: 0.8 K/uL (ref 0.7–4.0)
MCH: 24.5 pg — ABNORMAL LOW (ref 26.0–34.0)
MCHC: 30.5 g/dL (ref 30.0–36.0)
MCV: 80.4 fL (ref 80.0–100.0)
Monocytes Absolute: 0.4 K/uL (ref 0.1–1.0)
Monocytes Relative: 6 %
Neutro Abs: 5 K/uL (ref 1.7–7.7)
Neutrophils Relative %: 78 %
Platelet Count: 99 K/uL — ABNORMAL LOW (ref 150–400)
RBC: 3.47 MIL/uL — ABNORMAL LOW (ref 3.87–5.11)
RDW: 17 % — ABNORMAL HIGH (ref 11.5–15.5)
WBC Count: 6.3 K/uL (ref 4.0–10.5)
nRBC: 0 % (ref 0.0–0.2)

## 2024-03-01 LAB — FERRITIN: Ferritin: 85 ng/mL (ref 11–307)

## 2024-03-01 LAB — IRON AND IRON BINDING CAPACITY (CC-WL,HP ONLY)
Iron: 41 ug/dL (ref 28–170)
Saturation Ratios: 13 % (ref 10.4–31.8)
TIBC: 321 ug/dL (ref 250–450)
UIBC: 280 ug/dL (ref 148–442)

## 2024-03-01 LAB — FOLATE: Folate: 5.2 ng/mL — ABNORMAL LOW (ref >5.9)

## 2024-03-01 LAB — VITAMIN B12: Vitamin B-12: 418 pg/mL (ref 180–914)

## 2024-03-01 LAB — DIGOXIN LEVEL: Digoxin Level: 1.5 ng/mL (ref 0.8–2.0)

## 2024-03-01 MED ORDER — FOLIC ACID 1 MG PO TABS: 1.0000 mg | ORAL_TABLET | Freq: Every day | ORAL | 2 refills | Status: DC

## 2024-03-01 NOTE — Progress Notes (Signed)
 Patient Care Team: Jason Leita Repine, FNP as PCP - General (Internal Medicine) Waddell Danelle ORN, MD as PCP - Electrophysiology (Cardiology) Prentis Duwaine BROCKS, RN as Oncology Nurse Navigator  DIAGNOSIS:  Encounter Diagnoses  Name Primary?   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     SUMMARY OF ONCOLOGIC HISTORY: Oncology History  Lung cancer (HCC)  Anal cancer (HCC)  07/25/2016 Initial Diagnosis   Perianal: Squamous cell cancer atleast 5 cm, Moderately differentiated T2N1 (lung nodules, inguinal LN unclear etiology) stage 3B vs Stage 4   08/13/2016 -  Radiation Therapy   Anal Radiation (diverting colostomy)   06/24/2023 -  Chemotherapy   Patient is on Treatment Plan : LUNG NSCLC Osimertinib  q28d       CHIEF COMPLIANT: Follow-up of anemia  HISTORY OF PRESENT ILLNESS:   History of Present Illness Tricia Hernandez is a 89 year old female with recurrent urinary tract infections and anemia who presents with confusion.  She has recurrent urinary tract infections since December, initially treated with Augmentin and later with stronger antibiotics. Hospitalization in May for IV antibiotics temporarily resolved her confusion, but it has recently returned and worsened.  Her hemoglobin levels have gradually declined, currently at 8.5 g/dL, indicating anemia. She was in rehab until a month ago and now resides in a residential facility.  She underwent radiation treatment in 2018. Previous scans showed no signs of cancer, and recent scans did not reveal any gases in the bladder or vaginal cavity.     ALLERGIES:  is allergic to penicillins; antihistamines, chlorpheniramine-type; aspirin; codeine; hydrochlorothiazide; and sulfa antibiotics.  MEDICATIONS:  Current Outpatient Medications  Medication Sig Dispense Refill   acetaminophen  (TYLENOL ) 325 MG tablet Take 2 tablets (650 mg total) by mouth every 6 (six) hours  as needed for mild pain (pain score 1-3), moderate pain (pain score 4-6) or headache.     anti-nausea (EMETROL) solution Take 10 mLs by mouth every 15 (fifteen) minutes as needed for nausea or vomiting.     atorvastatin  (LIPITOR) 20 MG tablet Take 1 tablet (20 mg total) by mouth at bedtime. 90 tablet 3   digoxin  (LANOXIN ) 0.125 MG tablet Take 0.125 mg by mouth See admin instructions. Take 1 Tablet every day except for Wednesdays     diltiazem  (CARDIZEM  CD) 120 MG 24 hr capsule Take 1 capsule (120 mg total) by mouth daily. 90 capsule 2   ferrous sulfate  325 (65 FE) MG EC tablet Take 325 mg by mouth daily with breakfast.     fexofenadine  (ALLERGY RELIEF) 180 MG tablet Take 1 tablet (180 mg total) by mouth daily. 90 tablet 3   fluticasone  (FLONASE ) 50 MCG/ACT nasal spray Place 1 spray into both nostrils daily.     Fluticasone -Umeclidin-Vilant (TRELEGY ELLIPTA ) 200-62.5-25 MCG/ACT AEPB Inhale 1 puff into the lungs daily. 180 each 3   Multiple Vitamins-Minerals (PRESERVISION AREDS 2) CAPS Take 1 capsule by mouth in the morning and at bedtime.     nitrofurantoin , macrocrystal-monohydrate, (MACROBID ) 100 MG capsule Take 1 capsule (100 mg total) by mouth 2 (two) times daily. 14 capsule 0   osimertinib  mesylate (TAGRISSO ) 40 MG tablet Take 1 tablet (40 mg total) by mouth daily. 30 tablet 2   PARoxetine  (PAXIL ) 20 MG tablet Take 1 tablet (20 mg total) by mouth at bedtime.     Vitamin D , Ergocalciferol , (DRISDOL ) 1.25 MG (50000 UNIT) CAPS capsule TAKE 1 CAPSULE BY MOUTH 1 TIME A WEEK 12  capsule 1   furosemide  (LASIX ) 20 MG tablet TAKE ONE TABLET BY MOUTH DAILY (Patient not taking: Reported on 03/01/2024) 90 tablet 0   No current facility-administered medications for this visit.   Facility-Administered Medications Ordered in Other Visits  Medication Dose Route Frequency Provider Last Rate Last Admin   sodium phosphate  (FLEET) 7-19 GM/118ML enema 2 enema  2 enema Rectal Once Teresa Lonni HERO, MD         PHYSICAL EXAMINATION: ECOG PERFORMANCE STATUS: 1 - Symptomatic but completely ambulatory  Vitals:   03/01/24 1023  BP: 116/64  Pulse: 65  Resp: 17  Temp: 97.8 F (36.6 C)  SpO2: 98%   Filed Weights   03/01/24 1023  Weight: 204 lb (92.5 kg)    Physical Exam   (exam performed in the presence of a chaperone)  LABORATORY DATA:  I have reviewed the data as listed    Latest Ref Rng & Units 03/01/2024    9:50 AM 01/07/2024   11:20 AM 12/22/2023   10:54 AM  CMP  Glucose 70 - 99 mg/dL 92  99  889   BUN 8 - 23 mg/dL 13  14  12    Creatinine 0.44 - 1.00 mg/dL 9.00  8.82  8.91   Sodium 135 - 145 mmol/L 142  141  143   Potassium 3.5 - 5.1 mmol/L 4.2  3.8  3.6   Chloride 98 - 111 mmol/L 99  98  101   CO2 22 - 32 mmol/L 37  34  35   Calcium  8.9 - 10.3 mg/dL 8.9  8.8  8.9   Total Protein 6.5 - 8.1 g/dL 6.6  6.5  6.6   Total Bilirubin 0.0 - 1.2 mg/dL 0.4  0.4  0.5   Alkaline Phos 38 - 126 U/L 101  89  88   AST 15 - 41 U/L 38  15  14   ALT 0 - 44 U/L 27  10  8      Lab Results  Component Value Date   WBC 6.3 03/01/2024   HGB 8.5 (L) 03/01/2024   HCT 27.9 (L) 03/01/2024   MCV 80.4 03/01/2024   PLT 99 (L) 03/01/2024   NEUTROABS 5.0 03/01/2024    ASSESSMENT & PLAN:  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 It was unclear but the inguinal lymphadenopathy and the lung nodules could be metastatic disease. it could be a stage IIIa versus stage IV.  (Given her age we did not pursue biopsies of the lung)   Treatment summary 08/13/2016-09/25/2016 : XRT to anus S/P diverting colostomy   Mild anemia: Anemia due to chronic disease.  Today's hemoglobin is 8.5 Recent UTIs: Since the last 6 months.  Been in the hospital and discharged in May.  Confusion as a result of UTIs. Anemia: Her hemoglobin will need to be monitored.  I will request Cassie to watch her for another follow-up on her  anemia.  Lung cancer follow-up under the care of Dr. Sherrod. I do not need to follow her in the future as she will be followed by Cassie and Dr. Sherrod.    No orders of the defined types were placed in this encounter.  The patient has a good understanding of the overall plan. she agrees with it. she will call with any problems that may develop before the next visit here. Total time spent: 30 mins including face to face time and time spent for  planning, charting and co-ordination of care   Naomi MARLA Chad, MD 03/01/24

## 2024-03-02 ENCOUNTER — Telehealth: Payer: Self-pay | Admitting: Pharmacist

## 2024-03-02 NOTE — Telephone Encounter (Signed)
 Called and spoke with daughter regarding normal digoxin  levels. Will continue current dosage of 0.125mg  daily except for Wednesdays. Recheck in 1 month.

## 2024-03-04 ENCOUNTER — Other Ambulatory Visit: Payer: Self-pay

## 2024-03-04 DIAGNOSIS — C3490 Malignant neoplasm of unspecified part of unspecified bronchus or lung: Secondary | ICD-10-CM

## 2024-03-04 MED ORDER — FOLIC ACID 1 MG PO TABS: 1.0000 mg | ORAL_TABLET | Freq: Every day | ORAL | 2 refills | Status: AC

## 2024-03-04 NOTE — Progress Notes (Signed)
 Sent Folic acid  to [facility] and rehabilitation.

## 2024-03-06 ENCOUNTER — Other Ambulatory Visit (HOSPITAL_COMMUNITY): Payer: Self-pay

## 2024-03-08 ENCOUNTER — Other Ambulatory Visit: Payer: Self-pay

## 2024-03-23 ENCOUNTER — Encounter: Payer: Self-pay | Admitting: Internal Medicine

## 2024-03-23 ENCOUNTER — Telehealth: Payer: Self-pay | Admitting: Medical Oncology

## 2024-03-23 NOTE — Telephone Encounter (Signed)
 [name] said pts HGB was 7.6  at nursing home . She will have nursing home fax his hgb/recent labs. Next appt 09/17.

## 2024-03-25 ENCOUNTER — Other Ambulatory Visit: Payer: Self-pay | Admitting: Medical Oncology

## 2024-03-25 ENCOUNTER — Telehealth: Payer: Self-pay | Admitting: Medical Oncology

## 2024-03-25 DIAGNOSIS — D649 Anemia, unspecified: Secondary | ICD-10-CM

## 2024-03-25 NOTE — Telephone Encounter (Signed)
 Dtr will bring pt tomorrow for her blood transfusion.

## 2024-03-25 NOTE — Progress Notes (Signed)
 Blood transfusion orders entered and released for BB.

## 2024-03-25 NOTE — Progress Notes (Signed)
 Pt is allergic to antihistamines . Pharmacist commended Claritin  as premed for blood transfusin, which was ordered .

## 2024-03-25 NOTE — Progress Notes (Signed)
Blood transfusion orders entered.

## 2024-03-26 ENCOUNTER — Other Ambulatory Visit: Payer: Self-pay | Admitting: Medical Oncology

## 2024-03-26 ENCOUNTER — Inpatient Hospital Stay

## 2024-03-26 ENCOUNTER — Inpatient Hospital Stay: Attending: Hematology and Oncology

## 2024-03-26 DIAGNOSIS — C342 Malignant neoplasm of middle lobe, bronchus or lung: Secondary | ICD-10-CM | POA: Diagnosis present

## 2024-03-26 DIAGNOSIS — Z79899 Other long term (current) drug therapy: Secondary | ICD-10-CM | POA: Insufficient documentation

## 2024-03-26 DIAGNOSIS — C210 Malignant neoplasm of anus, unspecified: Secondary | ICD-10-CM | POA: Insufficient documentation

## 2024-03-26 DIAGNOSIS — I480 Paroxysmal atrial fibrillation: Secondary | ICD-10-CM

## 2024-03-26 DIAGNOSIS — D649 Anemia, unspecified: Secondary | ICD-10-CM

## 2024-03-26 DIAGNOSIS — C3490 Malignant neoplasm of unspecified part of unspecified bronchus or lung: Secondary | ICD-10-CM

## 2024-03-26 LAB — CBC WITH DIFFERENTIAL (CANCER CENTER ONLY)
Abs Immature Granulocytes: 0.01 K/uL (ref 0.00–0.07)
Basophils Absolute: 0 K/uL (ref 0.0–0.1)
Basophils Relative: 0 %
Eosinophils Absolute: 0.2 K/uL (ref 0.0–0.5)
Eosinophils Relative: 3 %
HCT: 29.1 % — ABNORMAL LOW (ref 36.0–46.0)
Hemoglobin: 8.6 g/dL — ABNORMAL LOW (ref 12.0–15.0)
Immature Granulocytes: 0 %
Lymphocytes Relative: 11 %
Lymphs Abs: 0.8 K/uL (ref 0.7–4.0)
MCH: 23.6 pg — ABNORMAL LOW (ref 26.0–34.0)
MCHC: 29.6 g/dL — ABNORMAL LOW (ref 30.0–36.0)
MCV: 79.9 fL — ABNORMAL LOW (ref 80.0–100.0)
Monocytes Absolute: 0.4 K/uL (ref 0.1–1.0)
Monocytes Relative: 6 %
Neutro Abs: 5.4 K/uL (ref 1.7–7.7)
Neutrophils Relative %: 80 %
Platelet Count: 106 K/uL — ABNORMAL LOW (ref 150–400)
RBC: 3.64 MIL/uL — ABNORMAL LOW (ref 3.87–5.11)
RDW: 17.4 % — ABNORMAL HIGH (ref 11.5–15.5)
WBC Count: 6.7 K/uL (ref 4.0–10.5)
nRBC: 0 % (ref 0.0–0.2)

## 2024-03-26 LAB — CMP (CANCER CENTER ONLY)
ALT: 11 U/L (ref 0–44)
AST: 16 U/L (ref 15–41)
Albumin: 3.8 g/dL (ref 3.5–5.0)
Alkaline Phosphatase: 95 U/L (ref 38–126)
Anion gap: 7 (ref 5–15)
BUN: 11 mg/dL (ref 8–23)
CO2: 32 mmol/L (ref 22–32)
Calcium: 8.7 mg/dL — ABNORMAL LOW (ref 8.9–10.3)
Chloride: 102 mmol/L (ref 98–111)
Creatinine: 0.89 mg/dL (ref 0.44–1.00)
GFR, Estimated: >60 mL/min (ref >60)
Glucose, Bld: 101 mg/dL — ABNORMAL HIGH (ref 70–99)
Potassium: 4 mmol/L (ref 3.5–5.1)
Sodium: 141 mmol/L (ref 135–145)
Total Bilirubin: 0.4 mg/dL (ref 0.0–1.2)
Total Protein: 6.4 g/dL — ABNORMAL LOW (ref 6.5–8.1)

## 2024-03-26 LAB — DIGOXIN LEVEL: Digoxin Level: 0.9 ng/mL (ref 0.8–2.0)

## 2024-03-26 LAB — PREPARE RBC (CROSSMATCH)

## 2024-03-29 ENCOUNTER — Other Ambulatory Visit (HOSPITAL_COMMUNITY): Payer: Self-pay

## 2024-03-29 ENCOUNTER — Other Ambulatory Visit: Payer: Self-pay

## 2024-03-29 ENCOUNTER — Other Ambulatory Visit: Payer: Self-pay | Admitting: Physician Assistant

## 2024-03-29 ENCOUNTER — Encounter: Payer: Self-pay | Admitting: Internal Medicine

## 2024-03-29 DIAGNOSIS — R8290 Unspecified abnormal findings in urine: Secondary | ICD-10-CM

## 2024-03-29 DIAGNOSIS — C3400 Malignant neoplasm of unspecified main bronchus: Secondary | ICD-10-CM

## 2024-03-29 MED ORDER — OSIMERTINIB MESYLATE 40 MG PO TABS
40.0000 mg | ORAL_TABLET | Freq: Every day | ORAL | 2 refills | Status: DC
  Filled 2024-03-29: qty 30, 30d supply, fill #0
  Filled 2024-04-30: qty 30, 30d supply, fill #1
  Filled 2024-06-02: qty 30, 30d supply, fill #2

## 2024-03-29 NOTE — Progress Notes (Signed)
 Specialty Pharmacy Ongoing Clinical Assessment Note  Tricia Hernandez is a 89 y.o. female who is being followed by the specialty pharmacy service for RxSp Oncology   Patient's specialty medication(s) reviewed today: Osimertinib  Mesylate (TAGRISSO )   Missed doses in the last 4 weeks: 0   Patient/Caregiver did not have any additional questions or concerns.   Therapeutic benefit summary: Patient is achieving benefit   Adverse events/side effects summary: No adverse events/side effects   Patient's therapy is appropriate to: Continue    Goals Addressed             This Visit's Progress    Slow Disease Progression   On track    Patient is on track. Patient will maintain adherence.  At visit on 01/19/24, recent scans showed no concerning findings for disease progression.         Follow up: 3 months  [facility] [place]

## 2024-03-29 NOTE — Progress Notes (Signed)
 Specialty Pharmacy Refill Coordination Note  Tricia Hernandez is a 89 y.o. female contacted today regarding refills of specialty medication(s) Osimertinib  Mesylate (TAGRISSO )   Patient requested Delivery   Delivery date: 04/06/24   Verified address: [address] [place]   Medication will be filled on 04/05/24.   This fill date is pending response to refill request from provider. Patient is aware and if they have not received fill by intended date they must follow up with pharmacy.

## 2024-03-30 LAB — BPAM RBC
Blood Product Expiration Date: 202509132359
Unit Type and Rh: 5100

## 2024-03-30 LAB — TYPE AND SCREEN
ABO/RH(D): O POS
Antibody Screen: NEGATIVE
Unit division: 0

## 2024-04-01 ENCOUNTER — Telehealth: Payer: Self-pay | Admitting: Medical Oncology

## 2024-04-01 ENCOUNTER — Other Ambulatory Visit: Payer: Self-pay | Admitting: Medical Oncology

## 2024-04-01 DIAGNOSIS — D649 Anemia, unspecified: Secondary | ICD-10-CM

## 2024-04-01 NOTE — Telephone Encounter (Signed)
 03/30/24 hgb=7.3.drawn at [facility] in [place]. Per Dr Sherrod 1 unit of blood ordered . Schedule message sent .  Lab orders entered.

## 2024-04-02 ENCOUNTER — Encounter: Payer: Self-pay | Admitting: Internal Medicine

## 2024-04-02 NOTE — Telephone Encounter (Signed)
 [name] notified of pt pending appt for blood transfusion on Monday.

## 2024-04-05 ENCOUNTER — Encounter: Payer: Self-pay | Admitting: Internal Medicine

## 2024-04-05 ENCOUNTER — Ambulatory Visit: Payer: Self-pay | Admitting: Pharmacist

## 2024-04-05 ENCOUNTER — Other Ambulatory Visit: Payer: Self-pay

## 2024-04-05 ENCOUNTER — Other Ambulatory Visit: Payer: Self-pay | Admitting: Medical Oncology

## 2024-04-05 ENCOUNTER — Inpatient Hospital Stay

## 2024-04-05 ENCOUNTER — Other Ambulatory Visit: Payer: Self-pay | Admitting: Physician Assistant

## 2024-04-05 DIAGNOSIS — C342 Malignant neoplasm of middle lobe, bronchus or lung: Secondary | ICD-10-CM | POA: Diagnosis not present

## 2024-04-05 DIAGNOSIS — D649 Anemia, unspecified: Secondary | ICD-10-CM

## 2024-04-05 LAB — CBC WITH DIFFERENTIAL (CANCER CENTER ONLY)
Abs Immature Granulocytes: 0.02 K/uL (ref 0.00–0.07)
Basophils Absolute: 0 K/uL (ref 0.0–0.1)
Basophils Relative: 0 %
Eosinophils Absolute: 0.2 K/uL (ref 0.0–0.5)
Eosinophils Relative: 3 %
HCT: 28.1 % — ABNORMAL LOW (ref 36.0–46.0)
Hemoglobin: 8.4 g/dL — ABNORMAL LOW (ref 12.0–15.0)
Immature Granulocytes: 0 %
Lymphocytes Relative: 14 %
Lymphs Abs: 0.8 K/uL (ref 0.7–4.0)
MCH: 23.8 pg — ABNORMAL LOW (ref 26.0–34.0)
MCHC: 29.9 g/dL — ABNORMAL LOW (ref 30.0–36.0)
MCV: 79.6 fL — ABNORMAL LOW (ref 80.0–100.0)
Monocytes Absolute: 0.4 K/uL (ref 0.1–1.0)
Monocytes Relative: 7 %
Neutro Abs: 4.4 K/uL (ref 1.7–7.7)
Neutrophils Relative %: 76 %
Platelet Count: 102 K/uL — ABNORMAL LOW (ref 150–400)
RBC: 3.53 MIL/uL — ABNORMAL LOW (ref 3.87–5.11)
RDW: 17.4 % — ABNORMAL HIGH (ref 11.5–15.5)
WBC Count: 5.8 K/uL (ref 4.0–10.5)
nRBC: 0 % (ref 0.0–0.2)

## 2024-04-05 LAB — PREPARE RBC (CROSSMATCH)

## 2024-04-05 LAB — SAMPLE TO BLOOD BANK

## 2024-04-05 MED ORDER — SODIUM CHLORIDE 0.9% IV SOLUTION
250.0000 mL | INTRAVENOUS | Status: DC
  Administered 2024-04-05: 100 mL via INTRAVENOUS

## 2024-04-05 MED ORDER — ACETAMINOPHEN 325 MG PO TABS
650.0000 mg | ORAL_TABLET | Freq: Once | ORAL | Status: AC
  Administered 2024-04-05: 650 mg via ORAL
  Filled 2024-04-05: qty 2

## 2024-04-05 MED ORDER — LORATADINE 10 MG PO TABS
10.0000 mg | ORAL_TABLET | Freq: Once | ORAL | Status: AC
  Administered 2024-04-05: 10 mg via ORAL
  Filled 2024-04-05: qty 1

## 2024-04-05 NOTE — Progress Notes (Signed)
Blood transfusion ordered.

## 2024-04-05 NOTE — Patient Instructions (Signed)
 Blood Transfusion, Adult A blood transfusion is a procedure in which you receive blood or a type of blood cell (blood component) through an IV. You may need a blood transfusion when you have a low blood count, which is a low number of any blood cell. This may result from a bleeding disorder, illness, injury, or surgery. The blood may come from a donor, or you may be able to have your own blood collected and stored (autologous blood donation) before a planned surgery. The blood given in a transfusion may be made up of different blood components. You may receive: Red blood cells. These carry oxygen to the cells in the body. Platelets. These help your blood to clot. Plasma. This is the liquid part of your blood. It carries proteins and other substances throughout the body. White blood cells. These help you fight infections. If you have hemophilia or another clotting disorder, you may also receive other types of blood products. Depending on the type of blood product, this procedure may take 1-4 hours to complete. Tell a health care provider about: Any bleeding problems you have. Any previous reactions you have had during a blood transfusion. Any allergies you have. All medicines you are taking, including vitamins, herbs, eye drops, creams, and over-the-counter medicines. Any surgeries you have had. Any medical conditions you have. Whether you are pregnant or may be pregnant. What are the risks? Talk with your health care provider about risks. The most common problems include: A mild allergic reaction, such as red, swollen areas of skin (hives) and itching. Fever or chills. This may be the body's response to new blood cells received. This may occur during or up to 4 hours after the transfusion. More serious problems may include: A serious allergic reaction that causes difficulty breathing or swelling around the face and lips. Transfusion-associated circulatory overload (TACO), or too much fluid in  the lungs. This may cause breathing problems. Transfusion-related acute lung injury (TRALI), which causes breathing difficulty and low oxygen in the blood. This can occur within hours of the transfusion or several days later. Iron overload. This can happen after receiving many blood transfusions over a period of time. Infection or virus being transmitted. This is rare because donated blood is carefully tested before it is given. Hemolytic transfusion reaction. This is rare. It happens when the body's defense system (immune system)tries to attack the new blood cells. Symptoms may include fever, chills, nausea, low blood pressure, and low back or chest pain. Transfusion-associated graft-versus-host disease (TAGVHD). This is rare. It happens when donated cells attack the body's healthy tissues. What happens before the procedure? You will have a blood test to check your blood type. This test is done to know what kind of blood your body will accept and to match it to the donor blood. If you are going to have a planned surgery, you may be able to do an autologous blood donation. This may be done in case you need to have a transfusion. You will have your temperature, blood pressure, and pulse checked before the transfusion. If you have had an allergic reaction to a transfusion in the past, you may be given medicine to help prevent a reaction. This medicine may be given to you by mouth (orally) or through an IV. What happens during the procedure?  An IV will be inserted into one of your veins. The bag of blood will be attached to your IV. The blood will then enter through your vein. Your temperature, blood pressure,  and pulse will be monitored during the transfusion. This monitoring is done to detect early signs of a transfusion reaction. Tell your nurse right away if you have any of these symptoms during the transfusion: Shortness of breath or trouble breathing. Chest or back pain. Fever or  chills. Itching or hives. If you have any signs or symptoms of a reaction, your transfusion will be stopped and you may be given medicine. When the transfusion is complete, your IV will be removed. Pressure may be applied to the IV site for a few minutes. A bandage (dressing)will be applied. The procedure may vary among health care providers and hospitals. What happens after the procedure? Your temperature, blood pressure, pulse, breathing rate, and blood oxygen level will be monitored until you leave the hospital or clinic. Your blood may be tested to see how you have responded to the transfusion. You may be warmed with fluids or blankets to maintain a normal body temperature. If you receive your blood transfusion in an outpatient setting, you will be told whom to contact to report any reactions. Where to find more information Visit the [employer]: redcross.org Summary A blood transfusion is a procedure in which you receive blood or a type of blood cell (blood component) through an IV. The blood given in a transfusion may be made up of different blood components. You may receive red blood cells, platelets, plasma, or white blood cells depending on the condition treated. Your temperature, blood pressure, and pulse will be monitored before, during, and after the transfusion. After the transfusion, your blood may be tested to see how your body has responded. This information is not intended to replace advice given to you by your health care provider. Make sure you discuss any questions you have with your health care provider. Document Revised: 10/26/2021 Document Reviewed: 10/26/2021 Elsevier Patient Education  2024 [employer].

## 2024-04-06 LAB — TYPE AND SCREEN
ABO/RH(D): O POS
Antibody Screen: NEGATIVE
Unit division: 0

## 2024-04-06 LAB — BPAM RBC
Blood Product Expiration Date: 202509232359
ISSUE DATE / TIME: 202508251412
Unit Type and Rh: 5100

## 2024-04-08 ENCOUNTER — Telehealth: Payer: Self-pay

## 2024-04-08 ENCOUNTER — Other Ambulatory Visit: Payer: Self-pay | Admitting: Family

## 2024-04-08 MED ORDER — LEVOCETIRIZINE DIHYDROCHLORIDE 5 MG PO TABS: 5.0000 mg | ORAL_TABLET | Freq: Every evening | ORAL | 1 refills | Status: AC

## 2024-04-08 NOTE — Telephone Encounter (Signed)
 Copied from CRM [id number]. Topic: Clinical - Medication Question >> Apr 08, 2024  8:06 AM Tricia Hernandez wrote: Reason for CRM: Tricia Hernandez called.. said the new medication fexofenadine  (ALLERGY RELIEF) 180 MG tablet -- generic for allergy it's causing headaches. Can she switch back to her previous allergy medication.. she can't recall the name.Tricia Hernandez Pls call daughter.Tricia Hernandez also wants to use [facility] - [place], [place] - [address] [place] [address] Phone: [phone] Fax: [phone]

## 2024-04-09 NOTE — Telephone Encounter (Signed)
 Spoke with pts daughter, [name] is aware Rx has been sent in and expressed understanding.

## 2024-04-13 ENCOUNTER — Inpatient Hospital Stay: Attending: Hematology and Oncology

## 2024-04-13 ENCOUNTER — Ambulatory Visit (HOSPITAL_COMMUNITY)
Admission: RE | Admit: 2024-04-13 | Discharge: 2024-04-13 | Disposition: A | Discharge status: Home / Self Care | Source: Ambulatory Visit | Attending: Physician Assistant | Admitting: Physician Assistant

## 2024-04-13 ENCOUNTER — Encounter: Payer: Self-pay | Admitting: Internal Medicine

## 2024-04-13 DIAGNOSIS — Z85048 Personal history of other malignant neoplasm of rectum, rectosigmoid junction, and anus: Secondary | ICD-10-CM | POA: Diagnosis not present

## 2024-04-13 DIAGNOSIS — D649 Anemia, unspecified: Secondary | ICD-10-CM | POA: Diagnosis not present

## 2024-04-13 DIAGNOSIS — C3490 Malignant neoplasm of unspecified part of unspecified bronchus or lung: Secondary | ICD-10-CM | POA: Diagnosis present

## 2024-04-13 DIAGNOSIS — C342 Malignant neoplasm of middle lobe, bronchus or lung: Secondary | ICD-10-CM | POA: Insufficient documentation

## 2024-04-13 DIAGNOSIS — Z9289 Personal history of other medical treatment: Secondary | ICD-10-CM

## 2024-04-13 LAB — CBC WITH DIFFERENTIAL/PLATELET
Abs Immature Granulocytes: 0.01 K/uL (ref 0.00–0.07)
Basophils Absolute: 0 K/uL (ref 0.0–0.1)
Basophils Relative: 0 %
Eosinophils Absolute: 0.2 K/uL (ref 0.0–0.5)
Eosinophils Relative: 4 %
HCT: 32.6 % — ABNORMAL LOW (ref 36.0–46.0)
Hemoglobin: 9.5 g/dL — ABNORMAL LOW (ref 12.0–15.0)
Immature Granulocytes: 0 %
Lymphocytes Relative: 14 %
Lymphs Abs: 0.8 K/uL (ref 0.7–4.0)
MCH: 24.4 pg — ABNORMAL LOW (ref 26.0–34.0)
MCHC: 29.1 g/dL — ABNORMAL LOW (ref 30.0–36.0)
MCV: 83.6 fL (ref 80.0–100.0)
Monocytes Absolute: 0.4 K/uL (ref 0.1–1.0)
Monocytes Relative: 7 %
Neutro Abs: 4.3 K/uL (ref 1.7–7.7)
Neutrophils Relative %: 75 %
Platelets: 106 K/uL — ABNORMAL LOW (ref 150–400)
RBC: 3.9 MIL/uL (ref 3.87–5.11)
RDW: 17.6 % — ABNORMAL HIGH (ref 11.5–15.5)
WBC: 5.7 K/uL (ref 4.0–10.5)
nRBC: 0 % (ref 0.0–0.2)

## 2024-04-13 LAB — COMPREHENSIVE METABOLIC PANEL WITH GFR
ALT: 10 U/L (ref 0–44)
AST: 18 U/L (ref 15–41)
Albumin: 3 g/dL — ABNORMAL LOW (ref 3.5–5.0)
Alkaline Phosphatase: 83 U/L (ref 38–126)
Anion gap: 13 (ref 5–15)
BUN: 13 mg/dL (ref 8–23)
CO2: 30 mmol/L (ref 22–32)
Calcium: 8.7 mg/dL — ABNORMAL LOW (ref 8.9–10.3)
Chloride: 98 mmol/L (ref 98–111)
Creatinine, Ser: 1.12 mg/dL — ABNORMAL HIGH (ref 0.44–1.00)
GFR, Estimated: 47 mL/min — ABNORMAL LOW (ref >60)
Glucose, Bld: 116 mg/dL — ABNORMAL HIGH (ref 70–99)
Potassium: 4 mmol/L (ref 3.5–5.1)
Sodium: 141 mmol/L (ref 135–145)
Total Bilirubin: 0.7 mg/dL (ref 0.0–1.2)
Total Protein: 6.2 g/dL — ABNORMAL LOW (ref 6.5–8.1)

## 2024-04-13 MED ORDER — IOHEXOL 9 MG/ML PO SOLN
ORAL | Status: AC
  Filled 2024-04-13: qty 1000

## 2024-04-22 ENCOUNTER — Other Ambulatory Visit: Payer: Self-pay | Admitting: Medical Oncology

## 2024-04-22 NOTE — Progress Notes (Unsigned)
 Lab date changed

## 2024-04-23 ENCOUNTER — Telehealth: Payer: Self-pay | Admitting: Medical Oncology

## 2024-04-23 NOTE — Telephone Encounter (Signed)
 Possible pneumonia and UTI. She wants a mychart visit . Appt changed.

## 2024-04-25 NOTE — Progress Notes (Signed)
 [facility] HEMATOLOGY-ONCOLOGY TeleHEALTH VISIT PROGRESS NOTE   I connected with Tricia Hernandez on 04/28/24 at 11:30 AM EDT by telephone and verified that I am speaking with the correct person using two identifiers.  I discussed the limitations, risks, security and privacy concerns of performing an evaluation and management service by telemedicine and the availability of in-person appointments. I also discussed with the patient that there may be a patient responsible charge related to this service. The patient expressed understanding and agreed to proceed.  Other persons participating in the visit and their role in the encounter: [name] (daughter)  Patient's location: SNF  Provider's location: Office  [facility] OFFICE PROGRESS NOTE  Jason Leita Repine, FNP [address] [place] [address]  DIAGNOSIS: Recurrent and metastatic Non-small cell lung cancer, adenocarcinoma.  She was initially diagnosed in 2013 status post right middle lobe resection, she was found to have recurrent disease in 2024 with extensive pleural nodularity in the right hemithorax without any nodal or pulmonary parenchymal involvement.  There is no evidence of extrathoracic metastatic disease.   Biomarker Findings HRD signature - HRDsig Negative Microsatellite status - MS-Stable Tumor Mutational Burden - 4 Muts/Mb Genomic Findings For a complete list of the genes assayed, please refer to the Appendix. EGFR L858R, T790M PBRM1 [id number]*49 RB1 R455* TP53 C141W 7 Disease relevant genes with no reportable alterations: ALK, BRAF, ERBB2, KRAS, MET, RET, ROS1 PD-L1 expression 55%  PRIOR THERAPY: Anal cancer treated with radiation and surgery in 2017   CURRENT THERAPY: Tagrisso  80 mg p.o. daily, first dose June 27, 2023. Status post 3.5 months. This was reduced to Tagrisso  40 mg p.o. daily in February 2025.   INTERVAL HISTORY: Tricia Hernandez 89 y.o. female and I  connected via MyChart message today.  The patient is currently in rehab/skilled nursing facility in [place]  and is currently being treated for UTI and URI therefore her visit was converted to a MyChart visit today.  She has not been checked for COVID-19.  Symptoms started around 04/19/2024.  She is receiving antibiotic injections daily.  The onset of symptoms last week she had a fever 101.6 as well as a cough producing dark phlegm.  Her cough is better than it was previously but is still productive and producing yellow phlegm.  She has also been having some intermittent disorientation.  She also had a sore throat which has since resolved.  She still is wearing 2 L of supplemental oxygen .   She denies any nausea, vomiting, diarrhea, or constipation.  She denies any blood in the stool.  The patient has a history of anal cancer status post surgery and radiation in 2017.  She previously saw Dr. Teresa and Dr. Avram.  She denies any unusual abdominal pain.  He has been struggling with anemia and is taking an iron supplement.  She recently had a restaging CT scan.  She is here today for evaluation to review her scan results and recommendations.  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 5/2 EGD: grade A reflux esophagitis.  Mild, non-hemorrhagic gastritis.  Ablation of 3 Duodenal AVMs   Herpes zoster    History of blood transfusion    for vaginal, uterine bleeding leading to hysterectomy.  in 12/2016 transfused x 1  for GI bleed.    History of hiatal hernia    Hyperlipidemia    Hypertension    Labyrinthitis    Lung cancer (HCC)    RML   Obesity    On home oxygen  therapy    3L just at night when I sleep (04/20/2018)   Paroxysmal supraventricular tachycardia (HCC)    Peptic ulcer of stomach    hx   Pneumonia 1980s X 1; 08/2016   walking; double   Presence of permanent cardiac pacemaker 04/20/2018   Pulmonary embolism (HCC) 05/2014   left lung   Seasonal allergies    bad (04/20/2018)   Vitamin D  deficiency     ALLERGIES:  is allergic to penicillins; antihistamines, chlorpheniramine-type; aspirin; codeine; hydrochlorothiazide; and sulfa antibiotics.  MEDICATIONS:  Current Outpatient Medications  Medication Sig Dispense Refill   acetaminophen  (TYLENOL ) 325 MG tablet Take 2 tablets (650 mg total) by mouth every 6 (six) hours as needed for mild pain (pain score 1-3), moderate pain (pain score 4-6) or headache.     anti-nausea (EMETROL) solution Take 10 mLs by mouth every 15 (fifteen) minutes as needed for nausea or vomiting.     atorvastatin  (LIPITOR) 20 MG tablet Take 1 tablet (20 mg total) by mouth at bedtime. 90 tablet 3   digoxin  (LANOXIN ) 0.125 MG tablet Take 0.125 mg by mouth See admin instructions. Take 1 Tablet every day except for Wednesdays     diltiazem  (CARDIZEM  CD) 120 MG 24 hr capsule Take 1 capsule (120 mg total) by mouth daily. 90 capsule 2   ferrous sulfate  325 (65 FE) MG EC tablet Take 325 mg by mouth daily with breakfast.     fluticasone  (FLONASE ) 50 MCG/ACT nasal spray Place 1 spray into both nostrils daily.     Fluticasone -Umeclidin-Vilant (TRELEGY ELLIPTA ) 200-62.5-25 MCG/ACT AEPB Inhale 1 puff into the lungs daily. 180 each 3   folic acid  (FOLVITE ) 1 MG tablet Take 1 tablet (1 mg total) by mouth daily. 30 tablet 2   furosemide  (LASIX ) 20 MG tablet TAKE ONE TABLET BY MOUTH DAILY (Patient not taking: Reported on 03/01/2024) 90 tablet 0   levocetirizine (XYZAL ) 5 MG tablet  Take 1 tablet (5 mg total) by mouth every evening. 90 tablet 1   Multiple Vitamins-Minerals (PRESERVISION AREDS 2) CAPS Take 1 capsule by mouth in the morning and at bedtime.     nitrofurantoin , macrocrystal-monohydrate, (MACROBID ) 100 MG capsule Take 1 capsule (100 mg total) by mouth 2 (two) times daily. 14 capsule 0   osimertinib  mesylate (TAGRISSO ) 40 MG tablet Take 1 tablet (40 mg total) by mouth daily. 30 tablet 2   PARoxetine  (PAXIL ) 20 MG tablet Take 1 tablet (20 mg total) by mouth at bedtime.     Vitamin D , Ergocalciferol , (DRISDOL ) 1.25 MG (50000 UNIT) CAPS capsule TAKE 1 CAPSULE BY MOUTH 1 TIME A WEEK 12 capsule 1   No current facility-administered medications for this visit.   Facility-Administered Medications Ordered in Other Visits  Medication Dose Route Frequency Provider Last Rate Last Admin   sodium phosphate  (FLEET) 7-19 GM/118ML enema 2 enema  2 enema Rectal Once Teresa Lonni HERO, MD        SURGICAL HISTORY:  Past Surgical History:  Procedure Laterality Date   APPENDECTOMY  1959   BASAL CELL CARCINOMA EXCISION     nose x 2   BREAST CYST ASPIRATION Right 2016?   CATARACT EXTRACTION W/ INTRAOCULAR LENS  IMPLANT, BILATERAL Bilateral 1990s   CHOLECYSTECTOMY OPEN  1980s   COLON SURGERY     COLOSTOMY N/A 07/29/2016   Procedure: COLOSTOMY;  Surgeon: Mitzie DELENA Freund, MD;  Location: [facility] OR;  Service: General;  Laterality: N/A;   DILATION AND CURETTAGE OF UTERUS     ESOPHAGOGASTRODUODENOSCOPY N/A 12/11/2016   Procedure: ESOPHAGOGASTRODUODENOSCOPY (EGD);  Surgeon: Gordy HERO Starch, MD;  Location[facility] END[facility]COPY;  Service: Endoscopy;  Laterality: N/A;   EVALUATION UNDER ANESTHESIA WITH HEMORRHOIDECTOMY AND PROCTOSCOPY N/A 07/25/2016   Procedure: EXAM UNDER ANESTHESIA, EXCISION PERIANAL MASS.;  Surgeon: Lynwood Pina, MD;  Location: [facility] OR;  Service: General;  Laterality: N/A;  Prone position   FLEXIBLE SIGMOIDOSCOPY N/A 06/16/2017   Procedure: FLEXIBLE SIGMOIDOSCOPY EXAM UNDER  ANESHESIA;  Surgeon: Teresa Lonni HERO, MD;  Location: [facility];  Service: General;  Laterality: N/A;   INSERT / REPLACE / REMOVE PACEMAKER  04/20/2018   IR RADIOLOGIST EVAL & MGMT  03/25/2017   LAPAROSCOPIC DIVERTED COLOSTOMY N/A 07/29/2016   Procedure: ATTEMPTED LAPAROSCOPIC ASSISTED COLOSTOMY;  Surgeon: Mitzie DELENA Freund, MD;  Location: [facility] OR;  Service: General;  Laterality: N/A;   LUNG REMOVAL, PARTIAL Right 2013   RML mass/carcinoid, Dr Jerona   PACEMAKER IMPLANT N/A 04/20/2018   Procedure: PACEMAKER IMPLANT;  Surgeon: Waddell Danelle ORN, MD;  Location: [facility] INVASIVE CV LAB;  Service: Cardiovascular;  Laterality: N/A;   SQUAMOUS CELL CARCINOMA EXCISION     rectum, right leg   TONSILLECTOMY AND ADENOIDECTOMY  1960s   VAGINAL HYSTERECTOMY  1959   partial    REVIEW OF SYSTEMS:   Review of Systems  Constitutional: Positive for fatigue, appetite change, and weight loss.  Denies any fever since last week.    HENT:   Negative for mouth sores, nosebleeds, sore throat (resolved) and trouble swallowing.   Eyes: Negative for eye problems and icterus.  Respiratory: Positive for stable dyspnea on exertion and increased cough. Negative for hemoptysis and wheezing.   Cardiovascular: Negative for chest pain and leg swelling.  Gastrointestinal: Negative for abdominal pain, constipation, diarrhea, nausea and vomiting.  Genitourinary: positive for urinary changes.  Negative for hematuria.   Musculoskeletal: Negative for back pain, gait problem, neck pain and neck stiffness.  Skin: Negative for itching and rash.  Neurological: Negative for dizziness, extremity weakness, gait problem, headaches, light-headedness and seizures.  Hematological: Negative for adenopathy. Does not bruise/bleed easily.  Psychiatric/Behavioral: Positive for intermittent confusion. Negative for depression and sleep disturbance. The patient is not nervous/anxious.       PHYSICAL EXAMINATION:  There were no vitals taken for  this visit.  ECOG PERFORMANCE STATUS: 2-3  Physical Exam  Constitutional: Oriented to person, place, and time. Psychiatric: Mood, memory and judgment normal.  Vitals reviewed.  LABORATORY DATA: Lab Results  Component Value Date   WBC 5.7 04/13/2024   HGB 9.5 (L) 04/13/2024   HCT 32.6 (L) 04/13/2024   MCV 83.6 04/13/2024   PLT 106 (L) 04/13/2024      Chemistry      Component Value Date/Time   NA 141 04/13/2024 1037   NA 142 05/07/2022 1010   NA 144 12/30/2016 0850   K 4.0  04/13/2024 1037   K 4.1 12/30/2016 0850   CL 98 04/13/2024 1037   CO2 30 04/13/2024 1037   CO2 33 (H) 12/30/2016 0850   BUN 13 04/13/2024 1037   BUN 18 05/07/2022 1010   BUN 27.8 (H) 12/30/2016 0850   CREATININE 1.12 (H) 04/13/2024 1037   CREATININE 0.89 03/26/2024 1146   CREATININE 1.0 12/30/2016 0850      Component Value Date/Time   CALCIUM  8.7 (L) 04/13/2024 1037   CALCIUM  9.7 12/30/2016 0850   ALKPHOS 83 04/13/2024 1037   ALKPHOS 46 12/30/2016 0850   AST 18 04/13/2024 1037   AST 16 03/26/2024 1146   AST 17 12/30/2016 0850   ALT 10 04/13/2024 1037   ALT 11 03/26/2024 1146   ALT 10 12/30/2016 0850   BILITOT 0.7 04/13/2024 1037   BILITOT 0.4 03/26/2024 1146   BILITOT 0.32 12/30/2016 0850       RADIOGRAPHIC STUDIES:  CT CHEST ABDOMEN PELVIS WO CONTRAST Result Date: 04/22/2024 CLINICAL DATA:  Metastatic disease evaluation, lung cancer * Tracking Code: BO * EXAM: CT CHEST, ABDOMEN AND PELVIS WITHOUT CONTRAST TECHNIQUE: Multidetector CT imaging of the chest, abdomen and pelvis was performed following the standard protocol without IV contrast. Oral enteric contrast was administered. RADIATION DOSE REDUCTION: This exam was performed according to the departmental dose-optimization program which includes automated exposure control, adjustment of the mA and/or kV according to patient size and/or use of iterative reconstruction technique. COMPARISON:  01/07/2024 FINDINGS: CT CHEST FINDINGS  Cardiovascular: Left chest multi lead pacer. Aortic atherosclerosis. Cardiomegaly. Three-vessel coronary artery calcifications. No pericardial effusion. Mediastinum/Nodes: No enlarged mediastinal, hilar, or axillary lymph nodes. Thyroid  gland, trachea, and esophagus demonstrate no significant findings. Lungs/Pleura: Redemonstrated postoperative/post treatment appearance of the chest status post wedge resection of the right middle lobe, with central fibrotic scarring and volume loss (series 3, image 74). New and increased subpleural nodularity along the posterior right upper lobe, index nodule on today's examination measuring 0.9 x 0.7 cm (series 3, image 44). Other small solid and ground-glass nodules unchanged, for example a ground-glass nodule in the left lower lobe measuring 0.6 cm (series 3, image 64). No pleural effusion or pneumothorax. Musculoskeletal: No chest wall abnormality. No acute osseous findings. CT ABDOMEN PELVIS FINDINGS Hepatobiliary: No focal liver abnormality is seen. Status post cholecystectomy. No biliary dilatation. Pancreas: Unremarkable. No pancreatic ductal dilatation or surrounding inflammatory changes. Spleen: Normal in size without significant abnormality. Adrenals/Urinary Tract: Adrenal glands are unremarkable. Duplication of the right ureters. Kidneys are otherwise normal, without renal calculi, solid lesion, or hydronephrosis. Bladder is unremarkable. Stomach/Bowel: Stomach is within normal limits. Sigmoid colon resection with left lower quadrant end colostomy. Sigmoid diverticulosis. Interval increase in masslike soft tissue thickening of the posterior right aspect of the low rectum and anus, on today's examination measuring 3.0 x 2.7 cm (series 2, image 113). Vascular/Lymphatic: Aortic atherosclerosis. No enlarged abdominal or pelvic lymph nodes. Reproductive: Hysterectomy. Other: No abdominal wall hernia or abnormality. No ascites. Musculoskeletal: No acute osseous findings.  IMPRESSION: 1. Redemonstrated postoperative/post treatment appearance of the chest status post wedge resection of the right middle lobe, with central fibrotic scarring and volume loss. 2. New and increased subpleural nodularity along the posterior right upper lobe, index nodule on today's examination measuring 0.9 x 0.7 cm. This may reflect scarring or atelectasis particularly given morphology but is worrisome for recurrent disease. PET-CT may be helpful to assess for abnormal FDG avidity. 3. Other small solid and ground-glass nodules unchanged nonspecific, attention on follow-up. 4.  Interval increase in masslike soft tissue thickening of the posterior right aspect of the low rectum and anus, on today's examination measuring 3.0 x 2.7 cm, worrisome for malignancy. Correlate with physical examination. Contrast enhanced MRI would be the imaging test of choice to assess for local mass. 5. Sigmoid colon resection with left lower quadrant end colostomy. 6. Cardiomegaly and coronary artery disease. Aortic Atherosclerosis (ICD10-I70.0). Electronically Signed   By: Marolyn JONETTA Jaksch M.D.   On: 04/22/2024 07:33      ASSESSMENT/PLAN:  This is a very pleasant 89 year old Caucasian female diagnosed with recurrent and metastatic non-small cell lung cancer, adenocarcinoma.  She was initially diagnosed in 2013.  She is status post right middle lobe resection.  She is found to have recurrent disease in 2024 with extensive pleural nodularity in the right hemithorax without any notable or pulmonary parenchymal involvement.  There is no evidence of extrathoracic metastatic disease.  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    PD-L1 expression was 55%. The patient started treatment with Tagrisso  80 mg p.o. daily on June 27, 2023.  This was reduced in February 2025. At a reduced dose of 40 mg p.o. daily.    I have written instructions for the nursing  facility. From an oncology standpoint, she will continue with tagrisso  40 mg p.o daily.   The patient was seen with Dr. Sherrod today.  Dr. Sherrod personally and independently reviewed the scan and discussed results with the patient today.  The scan showed stable disease except subpleural nodularity along posterior RUL which could be scarring or atelectasis  or recurrent disease.  Dr. Sherrod recommends monitoring this for now.   The scan also showed  Interval increase in masslike soft tissue thickening of the posterior right aspect of the low rectum and anus, on today's examination measuring 3.0 x 2.7 cm, worrisome for malignancy. We will re-refer the patient back to GI for consideration of evaluation and possible biopsy.   Dr. Sherrod recommends she continue on the same treatment at the same dose.    She will continue her abx for her UTI.   We will see her back in 6 weeks for evaluation and repeat blood work.   I discussed the assessment and treatment plan with the patient. The patient was provided an opportunity to ask questions and all were answered. The patient agreed with the plan and demonstrated an understanding of the instructions.  The patient was advised to call back or seek an in-person evaluation if the symptoms worsen or if the condition fails to improve as anticipated.   Gudrun Axe L Roxanna Mcever, PA-C 04/28/2024 12:03 PM  Orders Placed This Encounter  Procedures   CBC with Differential (Cancer Center Only)    Standing Status:   Future    Expected Date:   06/16/2024    Expiration Date:   04/28/2025   CMP (Cancer Center only)    Standing Status:   Future    Expected Date:   06/16/2024    Expiration Date:   04/28/2025   Digoxin  level    Standing Status:   Future    Expected Date:   06/09/2024    Expiration Date:   04/28/2025   Ambulatory referral to Gastroenterology    Referral Priority:   Routine    Referral Type:   Consultation    Referral Reason:   Specialty Services  Required    Number of Visits Requested:  1     Marnae Madani L Marcel Sorter, PA-C 04/28/24  ADDENDUM: Hematology/Oncology Attending:  I contributed to the virtual video visit today.  I reviewed her records, lab, scan and recommended her care plan.  This is a very pleasant 89 years old white female with recurrent and metastatic non-small cell lung cancer, adenocarcinoma with positive eGFR mutation L858R in addition to T7 72M diagnosed in 2024 when she presented with extensive right pleural nodularity as well as pleural effusion.  She started targeted therapy with single agent Tagrisso  80 mg p.o. daily for around 3.5 months but her dose was reduced to Tagrisso  40 mg p.o. daily in February 2025 secondary to intolerance of the higher dose with worsening rash. She has been tolerating her treatment with Tagrisso  fairly well.  She is currently on a skilled nursing facility after her upper respiratory infection. She had repeat CT scan of the chest, abdomen and pelvis performed 2 weeks ago.  I personally and independently reviewed the images and discussed the result with the patient and her daughter.  Her scan showed no concerning findings for disease progression but there was some persistent inflammatory process. I recommended for her to continue her current treatment with Tagrisso  with the same dose. We will see her back for follow-up visit in 6 weeks for evaluation and repeat blood work. She was advised to call immediately if she has any other concerning symptoms in the interval. I provided 30 minutes of face-to-face video visit time during this encounter, and > 50% was spent counseling as documented under my assessment & plan.  Disclaimer: This note was dictated with voice recognition software. Similar sounding words can inadvertently be transcribed and may be missed upon review. Sherrod MARLA Sherrod, MD

## 2024-04-28 ENCOUNTER — Telehealth: Payer: Self-pay

## 2024-04-28 ENCOUNTER — Inpatient Hospital Stay (HOSPITAL_BASED_OUTPATIENT_CLINIC_OR_DEPARTMENT_OTHER): Admitting: Physician Assistant

## 2024-04-28 DIAGNOSIS — C210 Malignant neoplasm of anus, unspecified: Secondary | ICD-10-CM | POA: Diagnosis not present

## 2024-04-28 DIAGNOSIS — I480 Paroxysmal atrial fibrillation: Secondary | ICD-10-CM | POA: Diagnosis not present

## 2024-04-28 NOTE — Telephone Encounter (Signed)
 Faxed referral to [facility] with confirmation at [phone].

## 2024-04-30 ENCOUNTER — Other Ambulatory Visit: Payer: Self-pay

## 2024-04-30 NOTE — Progress Notes (Signed)
 Specialty Pharmacy Refill Coordination Note  Tricia Hernandez is a 89 y.o. female contacted today regarding refills of specialty medication(s) Osimertinib  Mesylate (TAGRISSO )   Patient requested Delivery   Delivery date: 05/11/24   Verified address: [address] [place]   Medication will be filled on 05/10/24.

## 2024-05-10 ENCOUNTER — Other Ambulatory Visit: Payer: Self-pay

## 2024-05-10 ENCOUNTER — Encounter: Payer: Self-pay | Admitting: Internal Medicine

## 2024-05-10 ENCOUNTER — Telehealth: Payer: Self-pay

## 2024-05-10 NOTE — Telephone Encounter (Signed)
 Spoke with daughter [name] [facility]) and pt not doing well. She might not make her 10/6 appt with [facility] because her health has gotten worse. Pt does not have a monitor she needs RF telementry and needs to be paired again. Monitor is in device nurses room

## 2024-05-13 ENCOUNTER — Encounter: Payer: Self-pay | Admitting: Internal Medicine

## 2024-05-17 ENCOUNTER — Ambulatory Visit: Attending: Internal Medicine | Admitting: Internal Medicine

## 2024-05-19 ENCOUNTER — Encounter: Payer: Self-pay | Admitting: Internal Medicine

## 2024-06-02 ENCOUNTER — Other Ambulatory Visit: Payer: Self-pay

## 2024-06-02 NOTE — Progress Notes (Signed)
 Specialty Pharmacy Refill Coordination Note  Tricia Hernandez is a 89 y.o. female contacted today regarding refills of specialty medication(s) Osimertinib  Mesylate (TAGRISSO )   Patient requested Delivery   Delivery date: 06/11/24   Verified address: [address] [place]   Medication will be filled on 06/10/24.

## 2024-06-10 ENCOUNTER — Other Ambulatory Visit: Payer: Self-pay

## 2024-06-14 ENCOUNTER — Other Ambulatory Visit: Payer: Self-pay

## 2024-06-14 NOTE — Progress Notes (Signed)
 Specialty Pharmacy Ongoing Clinical Assessment Note  Tricia Hernandez is a 89 y.o. female who is being followed by the specialty pharmacy service for RxSp Oncology   Patient's specialty medication(s) reviewed today: Osimertinib  Mesylate (TAGRISSO )   Missed doses in the last 4 weeks: 0   Patient/Caregiver did not have any additional questions or concerns.   Therapeutic benefit summary: Patient is achieving benefit   Adverse events/side effects summary: No adverse events/side effects   Patient's therapy is appropriate to: Continue    Goals Addressed             This Visit's Progress    Slow Disease Progression       Patient is on track. Patient will maintain adherence. Per visit notes from 04/28/24 lung disease appears to be stable on scans but possible malignancy in rectum was seen for which patient was referred to GI.          Follow up: 3 months  Tricia Hernandez Specialty Pharmacist

## 2024-06-29 ENCOUNTER — Encounter: Admitting: Internal Medicine

## 2024-06-30 ENCOUNTER — Other Ambulatory Visit: Payer: Self-pay

## 2024-06-30 ENCOUNTER — Other Ambulatory Visit: Payer: Self-pay | Admitting: Physician Assistant

## 2024-06-30 DIAGNOSIS — R8290 Unspecified abnormal findings in urine: Secondary | ICD-10-CM

## 2024-06-30 DIAGNOSIS — C3400 Malignant neoplasm of unspecified main bronchus: Secondary | ICD-10-CM

## 2024-07-01 ENCOUNTER — Other Ambulatory Visit (HOSPITAL_COMMUNITY): Payer: Self-pay

## 2024-07-02 ENCOUNTER — Other Ambulatory Visit: Payer: Self-pay | Admitting: Physician Assistant

## 2024-07-02 ENCOUNTER — Other Ambulatory Visit: Payer: Self-pay

## 2024-07-02 DIAGNOSIS — C3400 Malignant neoplasm of unspecified main bronchus: Secondary | ICD-10-CM

## 2024-07-02 DIAGNOSIS — R8290 Unspecified abnormal findings in urine: Secondary | ICD-10-CM

## 2024-07-02 MED ORDER — OSIMERTINIB MESYLATE 40 MG PO TABS
40.0000 mg | ORAL_TABLET | Freq: Every day | ORAL | 2 refills | Status: AC
  Filled 2024-07-02 – 2024-07-13 (×3): qty 30, 30d supply, fill #0
  Filled 2024-08-09: qty 30, 30d supply, fill #1
  Filled 2024-08-30: qty 30, 30d supply, fill #2

## 2024-07-12 ENCOUNTER — Other Ambulatory Visit: Payer: Self-pay

## 2024-07-13 ENCOUNTER — Other Ambulatory Visit: Payer: Self-pay

## 2024-07-15 ENCOUNTER — Other Ambulatory Visit (HOSPITAL_COMMUNITY): Payer: Self-pay

## 2024-07-15 ENCOUNTER — Other Ambulatory Visit: Payer: Self-pay

## 2024-07-15 NOTE — Progress Notes (Signed)
 Specialty Pharmacy Refill Coordination Note  Tricia Hernandez is a 89 y.o. female contacted today regarding refills of specialty medication(s) Osimertinib  Mesylate (TAGRISSO )   Patient requested Delivery   Delivery date: 07/19/24   Verified address: [address] [place]   Medication will be filled on: 07/16/24

## 2024-07-30 NOTE — Telephone Encounter (Signed)
 Spoke with Tricia Hernandez and pt will not be followed anymore because she is in the nursing home and bed ridden. She does not want another monitor

## 2024-08-09 ENCOUNTER — Other Ambulatory Visit (HOSPITAL_COMMUNITY): Payer: Self-pay

## 2024-08-09 ENCOUNTER — Other Ambulatory Visit: Payer: Self-pay

## 2024-08-09 NOTE — Progress Notes (Signed)
 Specialty Pharmacy Refill Coordination Note  Tricia Hernandez is a 89 y.o. female contacted today regarding refills of specialty medication(s) Osimertinib  Mesylate (TAGRISSO )   Patient requested Delivery   Delivery date: 08/16/24   Verified address: [address] [place]   Medication will be filled on: 08/13/24

## 2024-08-10 ENCOUNTER — Other Ambulatory Visit: Payer: Self-pay

## 2024-08-19 ENCOUNTER — Other Ambulatory Visit: Payer: Self-pay

## 2024-08-30 ENCOUNTER — Other Ambulatory Visit: Payer: Self-pay

## 2024-08-30 NOTE — Progress Notes (Signed)
 Specialty Pharmacy Ongoing Clinical Assessment Note  I spoke with the patient's daughter, [name]. Tricia Hernandez is a 89 y.o. female who is being followed by the specialty pharmacy service for RxSp Oncology   Patient's specialty medication(s) reviewed today: Osimertinib  Mesylate (TAGRISSO )   Missed doses in the last 4 weeks: 0   Patient/Caregiver did not have any additional questions or concerns.   Therapeutic benefit summary: Patient is achieving benefit   Adverse events/side effects summary: No adverse events/side effects   Patient's therapy is appropriate to: Continue    Goals Addressed             This Visit's Progress    Slow Disease Progression   On track    Patient is on track. Patient will maintain adherence. Per visit notes from 04/28/24 lung disease appears to be stable on scans but possible malignancy in rectum was seen for which patient was referred to GI.          Follow up: 3 months  Silvano LOISE Dolly Specialty Pharmacist

## 2024-08-30 NOTE — Progress Notes (Signed)
 Specialty Pharmacy Refill Coordination Note  I spoke with the patient's daughter, [name]. DAREN DOSWELL is a 89 y.o. female contacted today regarding refills of specialty medication(s) Osimertinib  Mesylate (TAGRISSO )   Patient requested Delivery   Delivery date: 09/09/24   Verified address: [address] [place]   Medication will be filled on: 09/08/24

## 2024-09-08 ENCOUNTER — Other Ambulatory Visit: Payer: Self-pay

## 2024-09-10 ENCOUNTER — Telehealth: Payer: Self-pay

## 2024-09-10 ENCOUNTER — Other Ambulatory Visit: Payer: Self-pay

## 2024-09-10 DIAGNOSIS — C3490 Malignant neoplasm of unspecified part of unspecified bronchus or lung: Secondary | ICD-10-CM

## 2024-09-10 NOTE — Telephone Encounter (Signed)
 Spoke with patients daughter, Tricia Hernandez, regarding patients follow-up care. Tricia Hernandez stated the patient is currently residing at [facility] and [facility] and is on comfort care measures. She reported the patient is non-ambulatory, confused most of the time, and taking multiple pain medications, though she was unable to provide a complete medication list. She did report morphine is used as needed. Tricia Hernandez stated the patient has Medicaid and is unable to be seen outside of [place] .  Asked if the patient was seen by GI; Tricia Hernandez stated the appointment was not attended as she felt there were no further interventions available for the patient.   Myles Tricia Hernandez that a pharmacist recently refilled the patients Tagrisso , a medication used for lung cancer. Tricia Hernandez stated the patient is still taking Tagrisso  and that the specialty pharmacy contacts the nursing facility when refills are needed; however, she is unsure of the ordering provider. Myles Tricia Hernandez that since the patient is continuing Tagrisso , oncology follow-up is recommended. Informed her that a referral could be placed to [facility] in [place], [place]. Tricia Hernandez stated she is unsure if the patient will be able to attend appointments but stated they would try.  Myles Tricia Hernandez to call the office with any needs or concerns.  Contacted [facility] at [phone]; office was closed. Plan to call next week to obtain fax number and send referral.

## 2024-09-14 NOTE — Telephone Encounter (Signed)
 New patient referral faxed to [facility] with confirmation at [phone].
